# Patient Record
Sex: Male | Born: 1955
Health system: Southern US, Community
[De-identification: ages and names within clinical notes are randomized; demographics above are authoritative.]

## PROBLEM LIST (undated history)

## (undated) DIAGNOSIS — I509 Heart failure, unspecified: Secondary | ICD-10-CM

## (undated) DIAGNOSIS — I2510 Atherosclerotic heart disease of native coronary artery without angina pectoris: Secondary | ICD-10-CM

## (undated) DIAGNOSIS — F32A Depression, unspecified: Secondary | ICD-10-CM

## (undated) DIAGNOSIS — I219 Acute myocardial infarction, unspecified: Secondary | ICD-10-CM

## (undated) DIAGNOSIS — I10 Essential (primary) hypertension: Secondary | ICD-10-CM

## (undated) DIAGNOSIS — F329 Major depressive disorder, single episode, unspecified: Secondary | ICD-10-CM

## (undated) DIAGNOSIS — F99 Mental disorder, not otherwise specified: Secondary | ICD-10-CM

---

## 1982-04-25 DIAGNOSIS — I219 Acute myocardial infarction, unspecified: Secondary | ICD-10-CM

## 1982-04-25 HISTORY — DX: Acute myocardial infarction, unspecified: I21.9

## 1982-04-25 HISTORY — PX: ANGIOPLASTY: SHX39

## 2000-01-19 ENCOUNTER — Emergency Department (HOSPITAL_COMMUNITY): Admission: EM | Admit: 2000-01-19 | Discharge: 2000-01-19 | Payer: Self-pay

## 2000-08-09 ENCOUNTER — Emergency Department (HOSPITAL_COMMUNITY): Admission: EM | Admit: 2000-08-09 | Discharge: 2000-08-09 | Payer: Self-pay | Admitting: *Deleted

## 2001-03-28 ENCOUNTER — Encounter: Admission: RE | Admit: 2001-03-28 | Discharge: 2001-06-26 | Payer: Self-pay | Admitting: Family Medicine

## 2001-04-27 ENCOUNTER — Encounter: Payer: Self-pay | Admitting: Family Medicine

## 2001-04-27 ENCOUNTER — Encounter: Admission: RE | Admit: 2001-04-27 | Discharge: 2001-04-27 | Payer: Self-pay | Admitting: Family Medicine

## 2002-09-20 ENCOUNTER — Inpatient Hospital Stay (HOSPITAL_COMMUNITY): Admission: EM | Admit: 2002-09-20 | Discharge: 2002-09-24 | Payer: Self-pay | Admitting: Psychiatry

## 2003-01-30 ENCOUNTER — Encounter: Admission: RE | Admit: 2003-01-30 | Discharge: 2003-01-30 | Payer: Self-pay | Admitting: Family Medicine

## 2003-01-30 ENCOUNTER — Encounter: Payer: Self-pay | Admitting: Family Medicine

## 2003-06-09 ENCOUNTER — Encounter: Admission: RE | Admit: 2003-06-09 | Discharge: 2003-06-09 | Payer: Self-pay | Admitting: Neurology

## 2007-08-31 ENCOUNTER — Emergency Department (HOSPITAL_COMMUNITY): Admission: EM | Admit: 2007-08-31 | Discharge: 2007-08-31 | Payer: Self-pay | Admitting: Emergency Medicine

## 2007-11-06 ENCOUNTER — Encounter: Admission: RE | Admit: 2007-11-06 | Discharge: 2007-11-06 | Payer: Self-pay | Admitting: Sports Medicine

## 2007-11-29 ENCOUNTER — Encounter: Admission: RE | Admit: 2007-11-29 | Discharge: 2007-11-29 | Payer: Self-pay | Admitting: Sports Medicine

## 2007-12-20 ENCOUNTER — Encounter: Admission: RE | Admit: 2007-12-20 | Discharge: 2007-12-20 | Payer: Self-pay | Admitting: Sports Medicine

## 2008-01-23 ENCOUNTER — Emergency Department (HOSPITAL_COMMUNITY): Admission: EM | Admit: 2008-01-23 | Discharge: 2008-01-23 | Payer: Self-pay | Admitting: Emergency Medicine

## 2008-04-23 ENCOUNTER — Inpatient Hospital Stay (HOSPITAL_COMMUNITY): Admission: RE | Admit: 2008-04-23 | Discharge: 2008-05-01 | Payer: Self-pay | Admitting: Psychiatry

## 2008-04-23 ENCOUNTER — Ambulatory Visit: Payer: Self-pay | Admitting: Psychiatry

## 2008-04-23 ENCOUNTER — Emergency Department (HOSPITAL_COMMUNITY): Admission: EM | Admit: 2008-04-23 | Discharge: 2008-04-23 | Payer: Self-pay | Admitting: Emergency Medicine

## 2008-10-23 ENCOUNTER — Emergency Department (HOSPITAL_COMMUNITY): Admission: EM | Admit: 2008-10-23 | Discharge: 2008-10-23 | Payer: Self-pay | Admitting: Emergency Medicine

## 2008-11-23 ENCOUNTER — Emergency Department (HOSPITAL_COMMUNITY): Admission: EM | Admit: 2008-11-23 | Discharge: 2008-11-23 | Payer: Self-pay | Admitting: Emergency Medicine

## 2010-05-17 ENCOUNTER — Encounter: Payer: Self-pay | Admitting: Sports Medicine

## 2010-07-31 LAB — BASIC METABOLIC PANEL
BUN: 7 mg/dL (ref 6–23)
CO2: 22 mEq/L (ref 19–32)
Chloride: 103 mEq/L (ref 96–112)
GFR calc Af Amer: >60 mL/min (ref >60)
Potassium: 3.9 mEq/L (ref 3.5–5.1)

## 2010-07-31 LAB — DIFFERENTIAL
Basophils Relative: 1 % (ref 0–1)
Eosinophils Absolute: 0.1 10*3/uL (ref 0.0–0.7)
Eosinophils Relative: 1 % (ref 0–5)
Lymphs Abs: 1.7 10*3/uL (ref 0.7–4.0)
Monocytes Relative: 8 % (ref 3–12)

## 2010-07-31 LAB — CBC
HCT: 43.6 % (ref 39.0–52.0)
MCHC: 34.8 g/dL (ref 30.0–36.0)
MCV: 85.6 fL (ref 78.0–100.0)
RBC: 5.09 MIL/uL (ref 4.22–5.81)
WBC: 5.5 10*3/uL (ref 4.0–10.5)

## 2010-08-01 LAB — COMPREHENSIVE METABOLIC PANEL
Albumin: 3.6 g/dL (ref 3.5–5.2)
BUN: 12 mg/dL (ref 6–23)
Calcium: 8.9 mg/dL (ref 8.4–10.5)
Chloride: 102 mEq/L (ref 96–112)
Creatinine, Ser: 0.9 mg/dL (ref 0.4–1.5)
Total Bilirubin: 0.6 mg/dL (ref 0.3–1.2)
Total Protein: 7.1 g/dL (ref 6.0–8.3)

## 2010-08-01 LAB — CBC
HCT: 45.1 % (ref 39.0–52.0)
MCHC: 34.1 g/dL (ref 30.0–36.0)
MCV: 88.2 fL (ref 78.0–100.0)
Platelets: 212 10*3/uL (ref 150–400)
RDW: 12.2 % (ref 11.5–15.5)

## 2010-08-01 LAB — URINALYSIS, ROUTINE W REFLEX MICROSCOPIC
Bilirubin Urine: NEGATIVE
Leukocytes, UA: NEGATIVE
Nitrite: NEGATIVE
Specific Gravity, Urine: 1.042 — ABNORMAL HIGH (ref 1.005–1.030)
pH: 6 (ref 5.0–8.0)

## 2010-08-01 LAB — DIFFERENTIAL
Basophils Absolute: 0.1 10*3/uL (ref 0.0–0.1)
Lymphocytes Relative: 43 % (ref 12–46)
Lymphs Abs: 2.2 10*3/uL (ref 0.7–4.0)
Monocytes Absolute: 0.3 10*3/uL (ref 0.1–1.0)
Neutro Abs: 2.4 10*3/uL (ref 1.7–7.7)

## 2010-08-01 LAB — GLUCOSE, CAPILLARY: Glucose-Capillary: 270 mg/dL — ABNORMAL HIGH (ref 70–99)
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
## 2011-01-27 LAB — COMPREHENSIVE METABOLIC PANEL
ALT: 27 U/L (ref 0–53)
AST: 37 U/L (ref 0–37)
Albumin: 3.4 g/dL — ABNORMAL LOW (ref 3.5–5.2)
Alkaline Phosphatase: 68 U/L (ref 39–117)
BUN: 9 mg/dL (ref 6–23)
CO2: 21 mEq/L (ref 19–32)
Calcium: 9.4 mg/dL (ref 8.4–10.5)
Chloride: 102 mEq/L (ref 96–112)
Creatinine, Ser: 1 mg/dL (ref 0.4–1.5)
GFR calc Af Amer: >60 mL/min (ref >60)
GFR calc non Af Amer: >60 mL/min (ref >60)
Glucose, Bld: 174 mg/dL — ABNORMAL HIGH (ref 70–99)
Potassium: 3.8 mEq/L (ref 3.5–5.1)
Sodium: 138 mEq/L (ref 135–145)
Total Bilirubin: 0.9 mg/dL (ref 0.3–1.2)
Total Protein: 7.1 g/dL (ref 6.0–8.3)

## 2011-01-27 LAB — DIFFERENTIAL
Basophils Absolute: 0.1 10*3/uL (ref 0.0–0.1)
Basophils Relative: 1 % (ref 0–1)
Eosinophils Absolute: 0.1 10*3/uL (ref 0.0–0.7)
Eosinophils Relative: 2 % (ref 0–5)
Lymphocytes Relative: 36 % (ref 12–46)
Lymphs Abs: 1.8 10*3/uL (ref 0.7–4.0)
Monocytes Absolute: 0.4 10*3/uL (ref 0.1–1.0)
Monocytes Relative: 7 % (ref 3–12)
Neutro Abs: 2.8 10*3/uL (ref 1.7–7.7)
Neutrophils Relative %: 54 % (ref 43–77)

## 2011-01-27 LAB — RAPID URINE DRUG SCREEN, HOSP PERFORMED
Amphetamines: NOT DETECTED
Barbiturates: NOT DETECTED
Benzodiazepines: NOT DETECTED
Cocaine: POSITIVE — AB
Opiates: NOT DETECTED
Tetrahydrocannabinol: NOT DETECTED

## 2011-01-27 LAB — CBC
HCT: 49 % (ref 39.0–52.0)
Hemoglobin: 16.1 g/dL (ref 13.0–17.0)
MCHC: 32.9 g/dL (ref 30.0–36.0)
MCV: 90.4 fL (ref 78.0–100.0)
Platelets: 359 10*3/uL (ref 150–400)
RBC: 5.42 MIL/uL (ref 4.22–5.81)
RDW: 11.9 % (ref 11.5–15.5)
WBC: 5.1 10*3/uL (ref 4.0–10.5)

## 2011-01-27 LAB — ETHANOL: Alcohol, Ethyl (B): 107 mg/dL — ABNORMAL HIGH (ref 0–10)

## 2011-11-29 ENCOUNTER — Emergency Department (HOSPITAL_COMMUNITY)
Admission: EM | Admit: 2011-11-29 | Discharge: 2011-11-29 | Disposition: A | Discharge status: Home / Self Care | Payer: Medicare Other | Attending: Emergency Medicine | Admitting: Emergency Medicine

## 2011-11-29 ENCOUNTER — Encounter (HOSPITAL_COMMUNITY): Payer: Self-pay | Admitting: Emergency Medicine

## 2011-11-29 DIAGNOSIS — F17200 Nicotine dependence, unspecified, uncomplicated: Secondary | ICD-10-CM | POA: Insufficient documentation

## 2011-11-29 DIAGNOSIS — I10 Essential (primary) hypertension: Secondary | ICD-10-CM | POA: Insufficient documentation

## 2011-11-29 DIAGNOSIS — E119 Type 2 diabetes mellitus without complications: Secondary | ICD-10-CM | POA: Insufficient documentation

## 2011-11-29 DIAGNOSIS — R739 Hyperglycemia, unspecified: Secondary | ICD-10-CM

## 2011-11-29 DIAGNOSIS — I252 Old myocardial infarction: Secondary | ICD-10-CM | POA: Insufficient documentation

## 2011-11-29 DIAGNOSIS — I2510 Atherosclerotic heart disease of native coronary artery without angina pectoris: Secondary | ICD-10-CM | POA: Insufficient documentation

## 2011-11-29 DIAGNOSIS — E876 Hypokalemia: Secondary | ICD-10-CM

## 2011-11-29 HISTORY — DX: Acute myocardial infarction, unspecified: I21.9

## 2011-11-29 HISTORY — DX: Atherosclerotic heart disease of native coronary artery without angina pectoris: I25.10

## 2011-11-29 HISTORY — DX: Essential (primary) hypertension: I10

## 2011-11-29 LAB — COMPREHENSIVE METABOLIC PANEL
Albumin: 2.8 g/dL — ABNORMAL LOW (ref 3.5–5.2)
Alkaline Phosphatase: 83 U/L (ref 39–117)
BUN: 3 mg/dL — ABNORMAL LOW (ref 6–23)
CO2: 21 mEq/L (ref 19–32)
Chloride: 97 mEq/L (ref 96–112)
GFR calc Af Amer: >90 mL/min (ref >90)
GFR calc non Af Amer: >90 mL/min (ref >90)
Glucose, Bld: 302 mg/dL — ABNORMAL HIGH (ref 70–99)
Potassium: 2.6 mEq/L — CL (ref 3.5–5.1)
Total Bilirubin: 0.9 mg/dL (ref 0.3–1.2)

## 2011-11-29 LAB — CBC WITH DIFFERENTIAL/PLATELET
Basophils Absolute: 0 10*3/uL (ref 0.0–0.1)
Eosinophils Absolute: 0 10*3/uL (ref 0.0–0.7)
Eosinophils Relative: 0 % (ref 0–5)
Lymphocytes Relative: 15 % (ref 12–46)
MCV: 84.5 fL (ref 78.0–100.0)
Platelets: 285 10*3/uL (ref 150–400)
RDW: 13.1 % (ref 11.5–15.5)
WBC: 12.4 10*3/uL — ABNORMAL HIGH (ref 4.0–10.5)

## 2011-11-29 LAB — URINALYSIS, ROUTINE W REFLEX MICROSCOPIC
Ketones, ur: NEGATIVE mg/dL
Leukocytes, UA: NEGATIVE
Nitrite: NEGATIVE
Protein, ur: NEGATIVE mg/dL
pH: 6.5 (ref 5.0–8.0)

## 2011-11-29 LAB — URINE MICROSCOPIC-ADD ON

## 2011-11-29 LAB — POCT I-STAT 3, VENOUS BLOOD GAS (G3P V): Acid-Base Excess: 1 mmol/L (ref 0.0–2.0)

## 2011-11-29 LAB — GLUCOSE, CAPILLARY: Glucose-Capillary: 180 mg/dL — ABNORMAL HIGH (ref 70–99)

## 2011-11-29 MED ORDER — POTASSIUM CHLORIDE CRYS ER 20 MEQ PO TBCR
80.0000 meq | EXTENDED_RELEASE_TABLET | Freq: Once | ORAL | Status: AC
  Administered 2011-11-29: 80 meq via ORAL
  Filled 2011-11-29: qty 4

## 2011-11-29 MED ORDER — SODIUM CHLORIDE 0.9 % IV BOLUS (SEPSIS)
1000.0000 mL | Freq: Once | INTRAVENOUS | Status: AC
  Administered 2011-11-29: 1000 mL via INTRAVENOUS

## 2011-11-29 NOTE — ED Notes (Signed)
cbg is 283

## 2011-11-29 NOTE — ED Notes (Signed)
Pt c/o CBG being 685 last night at bed time, per pt he injected 10u of insulin before bed. This AM his CBG was 263 upon arrival to ED, pt did not take CBG at home and did not take insulin this AM.

## 2011-11-29 NOTE — ED Notes (Signed)
NAD noted at time of d/c home. Pt verbalized understanding of d/c inst. 

## 2011-11-29 NOTE — ED Provider Notes (Signed)
History     CSN: [id number]  Arrival date & time 11/29/11  0811   First MD Initiated Contact with Patient 11/29/11 0827      Chief Complaint  Patient presents with  . Hyperglycemia    (Consider location/radiation/quality/duration/timing/severity/associated sxs/prior treatment) HPI The patient presents with concerns of hyperglycemia and generalized fatigue.  He notes that approximately one month ago he stopped taking his medication regularly.  He has a history of hypertension, and diabetes.  He notes that over the past days in particular he has had generalized weakness, easy fatigability, mild anorexia.  Yesterday he took his blood sugar, found the results to be 600.  Today, as his symptoms persisted he presents for evaluation. He denies any ongoing chest pain, dyspnea, disorientation, vomiting.  Past Medical History  Diagnosis Date  . Diabetes mellitus   . Hypertension   . Coronary artery disease   . MI (myocardial infarction)     Past Surgical History  Procedure Date  . Angioplasty     History reviewed. No pertinent family history.  History  Substance Use Topics  . Smoking status: Current Everyday Smoker -- 0.5 packs/day    Types: Cigarettes  . Smokeless tobacco: Not on file  . Alcohol Use: Yes      Review of Systems  Constitutional:       Per HPI, otherwise negative  HENT:       Per HPI, otherwise negative  Eyes: Negative.   Respiratory:       Per HPI, otherwise negative  Cardiovascular:       Per HPI, otherwise negative  Gastrointestinal: Negative for vomiting.  Genitourinary: Negative.   Musculoskeletal:       Per HPI, otherwise negative  Skin: Negative.   Neurological: Negative for syncope.    Allergies  Review of patient's allergies indicates no known allergies.  Home Medications   Current Outpatient Rx  Name Route Sig Dispense Refill  . PRESCRIPTION MEDICATION Subcutaneous Inject 10 Units into the skin 2 (two) times daily. insulin      BP  142/83  Pulse 90  Temp 97.8 F (36.6 C) (Oral)  Resp 20  Ht 6\' 2"  (1.88 m)  Wt 185 lb (83.915 kg)  BMI 23.75 kg/m2  SpO2 99%  Physical Exam  Nursing note and vitals reviewed. Constitutional: He is oriented to person, place, and time. He appears well-developed. No distress.  HENT:  Head: Normocephalic and atraumatic.  Eyes: Conjunctivae and EOM are normal.  Cardiovascular: Normal rate and regular rhythm.   Pulmonary/Chest: Effort normal. No stridor. No respiratory distress.  Abdominal: He exhibits no distension.  Musculoskeletal: He exhibits no edema.  Neurological: He is alert and oriented to person, place, and time.  Skin: Skin is warm and dry.  Psychiatric: He has a normal mood and affect.    ED Course  Procedures (including critical care time)  Labs Reviewed  GLUCOSE, CAPILLARY - Abnormal; Notable for the following:    Glucose-Capillary 283 (*)     All other components within normal limits  COMPREHENSIVE METABOLIC PANEL  CBC WITH DIFFERENTIAL  URINALYSIS, ROUTINE W REFLEX MICROSCOPIC   No results found.   No diagnosis found.  Pulse ox 99% room air normal   Date: 11/29/2011  Rate: 74  Rhythm: normal sinus rhythm  QRS Axis: normal  Intervals: QT prolonged  ST/T Wave abnormalities: normal  Conduction Disutrbances:lvh  Narrative Interpretation:   Old EKG Reviewed: unchanged   MDM  This insulin dependant diabetic now p/w multiple complaints.  On my exam he was AOx3, w unremarkable exam.  Labs checked while the patient was resuscitated w IVF, K.  He felt significantly better, and was d/c after a long conversation on the need for med compliance and a repeat potassium draw.  Gerhard Munch, MD 12/02/11 [id number]

## 2012-04-30 ENCOUNTER — Emergency Department (HOSPITAL_COMMUNITY)
Admission: EM | Admit: 2012-04-30 | Discharge: 2012-04-30 | Disposition: A | Discharge status: Home / Self Care | Payer: Medicare Other | Attending: Emergency Medicine | Admitting: Emergency Medicine

## 2012-04-30 ENCOUNTER — Encounter (HOSPITAL_COMMUNITY): Payer: Self-pay | Admitting: *Deleted

## 2012-04-30 DIAGNOSIS — R739 Hyperglycemia, unspecified: Secondary | ICD-10-CM

## 2012-04-30 DIAGNOSIS — E1169 Type 2 diabetes mellitus with other specified complication: Secondary | ICD-10-CM | POA: Insufficient documentation

## 2012-04-30 DIAGNOSIS — Z79899 Other long term (current) drug therapy: Secondary | ICD-10-CM | POA: Insufficient documentation

## 2012-04-30 DIAGNOSIS — Z91199 Patient's noncompliance with other medical treatment and regimen due to unspecified reason: Secondary | ICD-10-CM

## 2012-04-30 DIAGNOSIS — Z9119 Patient's noncompliance with other medical treatment and regimen: Secondary | ICD-10-CM

## 2012-04-30 DIAGNOSIS — E119 Type 2 diabetes mellitus without complications: Secondary | ICD-10-CM

## 2012-04-30 DIAGNOSIS — R631 Polydipsia: Secondary | ICD-10-CM | POA: Insufficient documentation

## 2012-04-30 DIAGNOSIS — F17200 Nicotine dependence, unspecified, uncomplicated: Secondary | ICD-10-CM | POA: Insufficient documentation

## 2012-04-30 DIAGNOSIS — Z9889 Other specified postprocedural states: Secondary | ICD-10-CM | POA: Insufficient documentation

## 2012-04-30 DIAGNOSIS — I10 Essential (primary) hypertension: Secondary | ICD-10-CM | POA: Insufficient documentation

## 2012-04-30 DIAGNOSIS — I252 Old myocardial infarction: Secondary | ICD-10-CM | POA: Insufficient documentation

## 2012-04-30 DIAGNOSIS — I2510 Atherosclerotic heart disease of native coronary artery without angina pectoris: Secondary | ICD-10-CM | POA: Insufficient documentation

## 2012-04-30 LAB — POCT I-STAT 3, VENOUS BLOOD GAS (G3P V)
Bicarbonate: 22.6 mEq/L (ref 20.0–24.0)
O2 Saturation: 16 %
TCO2: 24 mmol/L (ref 0–100)
pCO2, Ven: 47.3 mmHg (ref 45.0–50.0)
pO2, Ven: 15 mmHg — CL (ref 30.0–45.0)

## 2012-04-30 LAB — GLUCOSE, CAPILLARY
Glucose-Capillary: 337 mg/dL — ABNORMAL HIGH (ref 70–99)
Glucose-Capillary: 291 mg/dL — ABNORMAL HIGH (ref 70–99)
Glucose-Capillary: 164 mg/dL — ABNORMAL HIGH (ref 70–99)
Glucose-Capillary: 230 mg/dL — ABNORMAL HIGH (ref 70–99)
Glucose-Capillary: 178 mg/dL — ABNORMAL HIGH (ref 70–99)

## 2012-04-30 LAB — URINALYSIS, ROUTINE W REFLEX MICROSCOPIC
Bilirubin Urine: NEGATIVE
Glucose, UA: >1000 mg/dL — AB
Hgb urine dipstick: NEGATIVE
Specific Gravity, Urine: 1.038 — ABNORMAL HIGH (ref 1.005–1.030)
Urobilinogen, UA: 0.2 mg/dL (ref 0.0–1.0)

## 2012-04-30 LAB — CBC WITH DIFFERENTIAL/PLATELET
Basophils Relative: 1 % (ref 0–1)
Eosinophils Absolute: 0.1 10*3/uL (ref 0.0–0.7)
HCT: 39.6 % (ref 39.0–52.0)
Hemoglobin: 13.9 g/dL (ref 13.0–17.0)
MCH: 31.2 pg (ref 26.0–34.0)
MCHC: 35.1 g/dL (ref 30.0–36.0)
Monocytes Absolute: 0.8 10*3/uL (ref 0.1–1.0)
Monocytes Relative: 14 % — ABNORMAL HIGH (ref 3–12)

## 2012-04-30 LAB — BASIC METABOLIC PANEL
BUN: 11 mg/dL (ref 6–23)
Chloride: 94 mEq/L — ABNORMAL LOW (ref 96–112)
Creatinine, Ser: 0.86 mg/dL (ref 0.50–1.35)
GFR calc Af Amer: >90 mL/min (ref >90)
GFR calc non Af Amer: >90 mL/min (ref >90)
Glucose, Bld: 705 mg/dL (ref 70–99)

## 2012-04-30 MED ORDER — SODIUM CHLORIDE 0.9 % IV SOLN
INTRAVENOUS | Status: DC
  Administered 2012-04-30: 3.8 [IU]/h via INTRAVENOUS
  Filled 2012-04-30: qty 1

## 2012-04-30 MED ORDER — SODIUM CHLORIDE 0.9 % IV BOLUS (SEPSIS)
1000.0000 mL | Freq: Once | INTRAVENOUS | Status: AC
  Administered 2012-04-30: 1000 mL via INTRAVENOUS

## 2012-04-30 MED ORDER — DEXTROSE-NACL 5-0.45 % IV SOLN: INTRAVENOUS | Status: DC

## 2012-04-30 MED ORDER — SODIUM CHLORIDE 0.9 % IV SOLN: INTRAVENOUS | Status: DC

## 2012-04-30 MED ORDER — METFORMIN HCL 500 MG PO TABS: 500.0000 mg | ORAL_TABLET | Freq: Two times a day (BID) | ORAL | Status: DC

## 2012-04-30 NOTE — ED Provider Notes (Signed)
History     CSN: [id number]  Arrival date & time 04/30/12  3664   First MD Initiated Contact with Patient 04/30/12 [phone]      Chief Complaint  Patient presents with  . Hyperglycemia    (Consider location/radiation/quality/duration/timing/severity/associated sxs/prior treatment) HPI Comments: [name] is a 57 y.o. male who presents for evaluation of polydipsia. He stopped taking his oral hypoglycemic agent, and insulin 6 months ago. He had no particular reason for stopping. He reports losing weight. Feeling dizzy, and feeling weak for several days. He denies headache, chest pain, nausea, vomiting, or abdominal pain. He gets more dizzy with standing. There are no alleviating factors.  The history is provided by the patient.    Past Medical History  Diagnosis Date  . Diabetes mellitus   . Hypertension   . Coronary artery disease   . MI (myocardial infarction)     Past Surgical History  Procedure Date  . Angioplasty     History reviewed. No pertinent family history.  History  Substance Use Topics  . Smoking status: Current Every Day Smoker -- 0.5 packs/day    Types: Cigarettes  . Smokeless tobacco: Never Used  . Alcohol Use: Yes      Review of Systems  All other systems reviewed and are negative.    Allergies  Review of patient's allergies indicates no known allergies.  Home Medications   Current Outpatient Rx  Name  Route  Sig  Dispense  Refill  . TUMS PO   Oral   Take 4 tablets by mouth 2 (two) times daily as needed. For indigestion         . IBUPROFEN 200 MG PO TABS   Oral   Take 400-600 mg by mouth every 6 (six) hours as needed.         Marland Kitchen GAS-X PO   Oral   Take 2 tablets by mouth daily as needed. For gas pain         . METFORMIN HCL 500 MG PO TABS   Oral   Take 1 tablet (500 mg total) by mouth 2 (two) times daily with a meal.   60 tablet   0     BP 144/83  Pulse 74  Temp 98.3 F (36.8 C) (Oral)  Resp 19  Ht 6\' 2"  (1.88 m)   Wt 170 lb (77.111 kg)  BMI 21.83 kg/m2  SpO2 100%  Physical Exam  Nursing note and vitals reviewed. Constitutional: He is oriented to person, place, and time. He appears well-developed.       Underweight.  HENT:  Head: Normocephalic and atraumatic.  Right Ear: External ear normal.  Left Ear: External ear normal.  Eyes: Conjunctivae normal and EOM are normal. Pupils are equal, round, and reactive to light.  Neck: Normal range of motion and phonation normal. Neck supple.  Cardiovascular: Normal rate, regular rhythm, normal heart sounds and intact distal pulses.   Pulmonary/Chest: Effort normal and breath sounds normal. He exhibits no bony tenderness.  Abdominal: Soft. Normal appearance. There is no tenderness.  Musculoskeletal: Normal range of motion.  Neurological: He is alert and oriented to person, place, and time. He has normal strength. No cranial nerve deficit or sensory deficit. He exhibits normal muscle tone. Coordination normal.  Skin: Skin is warm, dry and intact.  Psychiatric: He has a normal mood and affect. His behavior is normal. Judgment and thought content normal.    ED Course  Procedures (including critical care time)  Emergency department  treatment: IV fluid 2 L bolus, followed by maintenance. Normal saline.  IV, insulin per glucose stabilizer.  Reevaluation: 11:30; he feels better. His CBG is less than 200. He does not know if he can get his medications filled, on his own. I asked the nurse, to have the case manager see him to see if they can help.  [facility]. He reportedly has refills on them at the [place] as well. They are: Lantus 10 units each bedtime, Wellbutrin 100 mg each bedtime, lisinopril 5 mg daily, Lopressor 12.5 mg twice a day, sertraline 100 mg each bedtime and trazodone 50 mg each bedtime.  Patient has been evaluated by case management; he only has a little bit of money to purchase medications and  is on Medicare. He will be able to get medicines on the $4 list  CRITICAL CARE Performed by: Mancel Bale L   Total critical care time: 45 minutes Critical care time was exclusive of separately billable procedures and treating other patients.  Critical care was necessary to treat or prevent imminent or life-threatening deterioration.  Critical care was time spent personally by me on the following activities: development of treatment plan with patient and/or surrogate as well as nursing, discussions with consultants, evaluation of patient's response to treatment, examination of patient, obtaining history from patient or surrogate, ordering and performing treatments and interventions, ordering and review of laboratory studies, ordering and review of radiographic studies, pulse oximetry and re-evaluation of patient's condition.    Nursing notes, applicable records and vitals reviewed.   Radiologic Images/Reports reviewed.  Labs Reviewed  GLUCOSE, CAPILLARY - Abnormal; Notable for the following:    Glucose-Capillary >600 (*)     All other components within normal limits  CBC WITH DIFFERENTIAL - Abnormal; Notable for the following:    Monocytes Relative 14 (*)     All other components within normal limits  BASIC METABOLIC PANEL - Abnormal; Notable for the following:    Sodium 130 (*)     Chloride 94 (*)     Glucose, Bld 705 (*)     All other components within normal limits  URINALYSIS, ROUTINE W REFLEX MICROSCOPIC - Abnormal; Notable for the following:    Specific Gravity, Urine 1.038 (*)     Glucose, UA >1000 (*)     All other components within normal limits  POCT I-STAT 3, BLOOD GAS (G3P V) - Abnormal; Notable for the following:    pO2, Ven 15.0 (*)     Acid-base deficit 5.0 (*)     All other components within normal limits  GLUCOSE, CAPILLARY - Abnormal; Notable for the following:    Glucose-Capillary 439 (*)     All other components within normal limits  GLUCOSE, CAPILLARY  - Abnormal; Notable for the following:    Glucose-Capillary 337 (*)     All other components within normal limits  GLUCOSE, CAPILLARY - Abnormal; Notable for the following:    Glucose-Capillary 291 (*)     All other components within normal limits  GLUCOSE, CAPILLARY - Abnormal; Notable for the following:    Glucose-Capillary 164 (*)     All other components within normal limits  GLUCOSE, CAPILLARY - Abnormal; Notable for the following:    Glucose-Capillary 289 (*)     All other components within normal limits  GLUCOSE, CAPILLARY - Abnormal; Notable for the following:    Glucose-Capillary 230 (*)     All other components within normal limits  GLUCOSE, CAPILLARY - Abnormal; Notable for the following:    Glucose-Capillary 140 (*)     All other components within normal limits  GLUCOSE, CAPILLARY - Abnormal; Notable for the following:    Glucose-Capillary 178 (*)     All other components within normal limits  KETONES, QUALITATIVE  URINE MICROSCOPIC-ADD ON   Nursing notes, applicable records and vitals reviewed.  Radiologic Images/Reports reviewed.    1. Hyperglycemia   2. Diabetes   3. Noncompliance       MDM  Hyperglycemia, and diabetic, who is noncompliant with minimally elevated anion gap. PH is normal. I doubt DKA He has moderate dehydration. He is improved, with treatment in the emergency department. Doubt metabolic instability, serious bacterial infection or impending vascular collapse; the patient is stable for discharge.     Plan: Home Medications- Metformin; Home Treatments- increase fluid; Recommended follow up- PCP. Followup in 3 days     Flint Melter, MD 04/30/12 1750

## 2012-04-30 NOTE — Progress Notes (Signed)
04/30/12 1252 Called to aatmept to get an appointment for pt pt vis [phone] option 2 but informed for a f/u appointment CM must speak with [name], Dr Thomas Barker RN to schedule and fax d/c instructions including Rx given from [facility] ED to Fax #[phone] ATTN [name]  Still Pending a call from Thomas [place] RN

## 2012-04-30 NOTE — ED Notes (Signed)
Pt reports his sugar is high because he has been drinking a lot of water for the past three days. Pt denies checking his blood sugar.

## 2012-04-30 NOTE — Progress Notes (Signed)
[facility] ED CM spoke with Adela Lank, [place] RN to confirm pt was given Rx on 06/15/11 for glimepiride 10 mg po bid and on his very last recorded [facility] visit on 10/26/11 pt started on Regular insulin and Lantus 10 u q hs and told to increase by 2 units qhs until cbg 120 or below (cbg at that time was > 500)  Pt reports being non compliant with this. [facility] Rn confirmed pt will not be able to obtain free medications at [facility] clinic Pt is not a candidate for [facility] MATCH program but has been encouraged to take prescriptions provided today by EDP to [facility] outpatient pharmacy for discount if possible Pt Informed that he is not a MATCH candidate and discount at [facility] outpatient pharmacy is not a guarantee because he is a confirmed medicare covered patient Pt was given an appointment for Thursday May 03 2012 at 11 am at [facility] clinic by [facility] RN and pt informed he needs to be early for this appointment CM updated [facility] ED RN and Pharmacist, Baron Hamper, at [facility] ED.  CM spoke with [facility] outpatient pharmacist, to get quotes for medicine costs Recommends pt get $4 medications from [place] and [place] versus outpatient pharmacy CM updated EDP, Effie Shy who states pt can be d/c on only Metformin $4 ist medicine from [facility] pharmacy

## 2012-04-30 NOTE — Progress Notes (Signed)
CM faxed clinicals to assist with [facility]/03/12 [facility] appointment to Janae Sauce, [place] RN at [phone] with confirmation report

## 2012-04-30 NOTE — Progress Notes (Signed)
   CARE MANAGEMENT ED NOTE 04/30/2012  Patient:  [name], [name]   Account Number:  [id number]  Date Initiated:  04/30/2012  Documentation initiated by:  Edd Arbour  Subjective/Objective Assessment:   27 M Medicare pt who states he sees a pcp (African MD) in [place] [facility] clinic Has not seen pcp in a year Living with a male friend in [place] Dx hyperglycemia Has not been taking medications in a year     Subjective/Objective Assessment Detail:   No medications on file per [facility] 04/30/12 record- -given doses of regular insulin and ns boluses in [facility] ED    PCP is at [facility] clinic PCP is Dr Hulen Skains  Pt agrees to pay for $4 medicines if available     Action/Plan:   Informed of not being a candidate for [facility] program. Inquired if he is willing to pay $4 for medication   Action/Plan Detail:   1225 CM left a voice message for Dr Dayna Barker RN, Adela Lank to return a call to assist with list of medications for pt and a MD appointment Dr Effie Shy and [facility] ED RN updated on   Anticipated DC Date:  04/30/2012     Status Recommendation to Physician:   Result of Recommendation:    Other ED Services  Consult Working Plan    DC Planning Services  Other  PCP issues

## 2012-05-21 ENCOUNTER — Emergency Department (HOSPITAL_COMMUNITY)
Admission: EM | Admit: 2012-05-21 | Discharge: 2012-05-21 | Disposition: A | Discharge status: Home / Self Care | Payer: Medicare Other | Attending: Emergency Medicine | Admitting: Emergency Medicine

## 2012-05-21 DIAGNOSIS — Z79899 Other long term (current) drug therapy: Secondary | ICD-10-CM | POA: Insufficient documentation

## 2012-05-21 DIAGNOSIS — I10 Essential (primary) hypertension: Secondary | ICD-10-CM | POA: Insufficient documentation

## 2012-05-21 DIAGNOSIS — I252 Old myocardial infarction: Secondary | ICD-10-CM | POA: Insufficient documentation

## 2012-05-21 DIAGNOSIS — B3742 Candidal balanitis: Secondary | ICD-10-CM

## 2012-05-21 DIAGNOSIS — F17200 Nicotine dependence, unspecified, uncomplicated: Secondary | ICD-10-CM | POA: Insufficient documentation

## 2012-05-21 DIAGNOSIS — I2510 Atherosclerotic heart disease of native coronary artery without angina pectoris: Secondary | ICD-10-CM | POA: Insufficient documentation

## 2012-05-21 DIAGNOSIS — E119 Type 2 diabetes mellitus without complications: Secondary | ICD-10-CM | POA: Insufficient documentation

## 2012-05-21 DIAGNOSIS — B3749 Other urogenital candidiasis: Secondary | ICD-10-CM | POA: Insufficient documentation

## 2012-05-21 MED ORDER — CLOTRIMAZOLE 1 % EX CREA: TOPICAL_CREAM | CUTANEOUS | Status: DC

## 2012-05-21 MED ORDER — FLUCONAZOLE 100 MG PO TABS
200.0000 mg | ORAL_TABLET | Freq: Once | ORAL | Status: AC
  Administered 2012-05-21: 200 mg via ORAL
  Filled 2012-05-21: qty 2

## 2012-05-21 NOTE — ED Notes (Signed)
Was seen at [facility] clinic last week and and was told he had yeast on his penis he was to get med for it in mail it did not come

## 2012-05-21 NOTE — ED Provider Notes (Signed)
History     CSN: [id number]  Arrival date & time 05/21/12  1356   First MD Initiated Contact with Patient 05/21/12 1616     Chief complaint: possible yeast infection of penis   (Consider location/radiation/quality/duration/timing/severity/associated sxs/prior treatment) The history is provided by the patient.  pt notes irritation to head of penis, w whitish d/c to head of penis for past couple weeks. No acute or abrupt change today. Hx diabetes. Denies polyuria or polydipsia. No nv. No fever/chills. No urethral discharge or dysuria. Denies prior rx for same. Symptoms constant.     Past Medical History  Diagnosis Date  . Diabetes mellitus   . Hypertension   . Coronary artery disease   . MI (myocardial infarction)     Past Surgical History  Procedure Date  . Angioplasty     No family history on file.  History  Substance Use Topics  . Smoking status: Current Every Day Smoker -- 0.5 packs/day    Types: Cigarettes  . Smokeless tobacco: Never Used  . Alcohol Use: Yes      Review of Systems  Constitutional: Negative for fever and chills.  Gastrointestinal: Negative for nausea and vomiting.  Genitourinary: Negative for dysuria and discharge.    Allergies  Review of patient's allergies indicates no known allergies.  Home Medications   Current Outpatient Rx  Name  Route  Sig  Dispense  Refill  . TUMS PO   Oral   Take 4 tablets by mouth 2 (two) times daily as needed. For indigestion         . IBUPROFEN 200 MG PO TABS   Oral   Take 400-600 mg by mouth every 6 (six) hours as needed.         Marland Kitchen METFORMIN HCL 500 MG PO TABS   Oral   Take 1 tablet (500 mg total) by mouth 2 (two) times daily with a meal.   60 tablet   0   . GAS-X PO   Oral   Take 2 tablets by mouth daily as needed. For gas pain           BP 119/75  Pulse 78  Temp 96.8 F (36 C) (Oral)  Resp 16  SpO2 100%  Physical Exam  Nursing note and vitals reviewed. Constitutional: He is  oriented to person, place, and time. He appears well-developed and well-nourished. No distress.  HENT:  Head: Atraumatic.  Eyes: Conjunctivae normal are normal.  Neck: Neck supple. No tracheal deviation present.  Cardiovascular: Normal rate.   Pulmonary/Chest: Effort normal. No accessory muscle usage. No respiratory distress.  Abdominal: He exhibits no distension. There is no tenderness.  Genitourinary:       Fungal balanitis. No urethral discharge.   Musculoskeletal: Normal range of motion.  Neurological: He is alert and oriented to person, place, and time.  Skin: Skin is warm and dry. No rash noted.       No other rash/lesions  Psychiatric: He has a normal mood and affect.    ED Course  Procedures (including critical care time)    MDM  Fluconazole po.  Discussed hygiene of area, control of dm.    Will give rx clotrimazole at home.           Suzi Roots, MD 05/21/12 [phone]

## 2012-05-26 ENCOUNTER — Encounter (HOSPITAL_COMMUNITY): Payer: Self-pay | Admitting: Emergency Medicine

## 2012-05-26 ENCOUNTER — Emergency Department (HOSPITAL_COMMUNITY)
Admission: EM | Admit: 2012-05-26 | Discharge: 2012-05-26 | Disposition: A | Discharge status: Home / Self Care | Payer: Medicare Other | Attending: Emergency Medicine | Admitting: Emergency Medicine

## 2012-05-26 DIAGNOSIS — F17200 Nicotine dependence, unspecified, uncomplicated: Secondary | ICD-10-CM | POA: Insufficient documentation

## 2012-05-26 DIAGNOSIS — Z79899 Other long term (current) drug therapy: Secondary | ICD-10-CM | POA: Insufficient documentation

## 2012-05-26 DIAGNOSIS — R197 Diarrhea, unspecified: Secondary | ICD-10-CM | POA: Insufficient documentation

## 2012-05-26 DIAGNOSIS — I252 Old myocardial infarction: Secondary | ICD-10-CM | POA: Insufficient documentation

## 2012-05-26 DIAGNOSIS — I10 Essential (primary) hypertension: Secondary | ICD-10-CM | POA: Insufficient documentation

## 2012-05-26 DIAGNOSIS — E119 Type 2 diabetes mellitus without complications: Secondary | ICD-10-CM | POA: Insufficient documentation

## 2012-05-26 DIAGNOSIS — Z794 Long term (current) use of insulin: Secondary | ICD-10-CM | POA: Insufficient documentation

## 2012-05-26 DIAGNOSIS — I2510 Atherosclerotic heart disease of native coronary artery without angina pectoris: Secondary | ICD-10-CM | POA: Insufficient documentation

## 2012-05-26 LAB — URINALYSIS, ROUTINE W REFLEX MICROSCOPIC
Leukocytes, UA: NEGATIVE
Nitrite: NEGATIVE
Specific Gravity, Urine: 1.018 (ref 1.005–1.030)
pH: 5.5 (ref 5.0–8.0)

## 2012-05-26 LAB — GLUCOSE, CAPILLARY: Glucose-Capillary: 134 mg/dL — ABNORMAL HIGH (ref 70–99)

## 2012-05-26 MED ORDER — DIPHENOXYLATE-ATROPINE 2.5-0.025 MG PO TABS
2.0000 | ORAL_TABLET | ORAL | Status: AC
  Administered 2012-05-26: 2 via ORAL
  Filled 2012-05-26: qty 2

## 2012-05-26 NOTE — ED Notes (Signed)
PA at bedside.

## 2012-05-26 NOTE — ED Notes (Signed)
Pt states that diarrhea began 2 weeks ago with some intermmittant abdominal pain. Pt has felt weak and is eating; diarrhea happens after about 5 minutes after eating. Pt eats 3 meals a day.  No nausea or vomiting.  Pt voiding 3 times a day. Penile discharge relieved by recent antibiotics.  Glucose 134.  Pt NPO for now.

## 2012-05-26 NOTE — ED Provider Notes (Signed)
History     CSN: [id number]  Arrival date & time 05/26/12  6213   First MD Initiated Contact with Patient 05/26/12 1006      Chief Complaint  Patient presents with  . Diarrhea    (Consider location/radiation/quality/duration/timing/severity/associated sxs/prior treatment) HPI Comments: Patient presents c/o non-bloody, non-bilious diarrhea x 2 weeks. States his bowel movements are looser than usual and slightly watery in nature; states he has a bowel movement every time he eats. Patient denies polyuria, polydypsia, N/V, abdominal pain, dysuria, lightheadedness, dizziness, fever, SOB, and chest pain. Denies unusual foul odor. Patient was started on fluconazole on 05/22/11 for yeast infection; all symptoms for this have resolved. Was also seen on 05/01/2011 and was prescribed 500mg  metformin PO BID increasing his total metformin dosage to 1500mg  PO BID. States last colonoscopy was 3 yrs ago and was normal  Patient is a 57 y.o. male presenting with diarrhea. The history is provided by the patient. No language interpreter was used.  Diarrhea The primary symptoms include diarrhea. Primary symptoms do not include fever, abdominal pain, nausea, vomiting, dysuria or rash. The illness began more than 7 days ago. The onset was gradual. The problem has not changed since onset. The illness does not include chills or constipation.    Past Medical History  Diagnosis Date  . Diabetes mellitus   . Hypertension   . Coronary artery disease   . MI (myocardial infarction)     Past Surgical History  Procedure Date  . Angioplasty     No family history on file.  History  Substance Use Topics  . Smoking status: Current Every Day Smoker -- 0.5 packs/day    Types: Cigarettes  . Smokeless tobacco: Never Used  . Alcohol Use: Yes      Review of Systems  Constitutional: Negative for fever and chills.  HENT:       Denies dry mouth  Eyes: Negative for visual disturbance.  Respiratory: Negative for  shortness of breath.   Cardiovascular: Negative for chest pain.  Gastrointestinal: Positive for diarrhea. Negative for nausea, vomiting, abdominal pain, constipation, blood in stool and rectal pain.  Genitourinary: Negative for dysuria, frequency, hematuria, decreased urine volume, discharge, difficulty urinating and penile pain.  Skin: Negative for rash.  Neurological: Negative for dizziness and light-headedness.    Allergies  Review of patient's allergies indicates no known allergies.  Home Medications   Current Outpatient Rx  Name  Route  Sig  Dispense  Refill  . BUPROPION HCL 100 MG PO TABS   Oral   Take 100 mg by mouth at bedtime.         . IBUPROFEN 200 MG PO TABS   Oral   Take 400-600 mg by mouth every 6 (six) hours as needed.         . INSULIN GLULISINE IJ   Injection   Inject 28 Units as directed daily.          Marland Kitchen LISINOPRIL 10 MG PO TABS   Oral   Take 5 mg by mouth daily.         Marland Kitchen METFORMIN HCL 1000 MG PO TABS   Oral   Take 1,000 mg by mouth 2 (two) times daily with a meal. Pt takes 1,000mg  and 500mg  tab to equal 1,500 mg twice a day         . METFORMIN HCL 500 MG PO TABS   Oral   Take 500 mg by mouth 2 (two) times daily with a meal. Pt  takes 1,000mg  and 500mg  tab to equal 1,500 mg twice a day         . METOPROLOL TARTRATE 25 MG PO TABS   Oral   Take 25 mg by mouth 2 (two) times daily.         . SERTRALINE HCL 100 MG PO TABS   Oral   Take 100 mg by mouth at bedtime.         . TRAZODONE HCL 50 MG PO TABS   Oral   Take 50 mg by mouth at bedtime.           BP 153/97  Pulse 63  Temp 98.6 F (37 C) (Oral)  Resp 16  SpO2 97%  Physical Exam  Constitutional: He appears well-developed and well-nourished. No distress.  HENT:  Head: Normocephalic and atraumatic.  Mouth/Throat: Oropharynx is clear and moist.       Mucous membranes moist  Eyes: Pupils are equal, round, and reactive to light. No scleral icterus.  Neck: Normal range of  motion.  Cardiovascular: Normal rate and regular rhythm.        Palpable distal pulses  Pulmonary/Chest: Effort normal and breath sounds normal. No respiratory distress. He has no wheezes.  Abdominal: Soft. Bowel sounds are normal. He exhibits no distension and no mass. There is no tenderness. There is no rebound and no guarding.  Musculoskeletal: Normal range of motion. He exhibits no edema.  Neurological: He is alert.  Skin: Skin is warm. He is not diaphoretic.  Psychiatric: He has a normal mood and affect. His behavior is normal.    ED Course  Procedures (including critical care time)  Labs Reviewed  GLUCOSE, CAPILLARY - Abnormal; Notable for the following:    Glucose-Capillary 134 (*)     All other components within normal limits  URINALYSIS, ROUTINE W REFLEX MICROSCOPIC - Abnormal; Notable for the following:    APPearance CLOUDY (*)     Glucose, UA 250 (*)     Bilirubin Urine SMALL (*)     All other components within normal limits   No results found.   1. Diarrhea   2. Diabetes       MDM  Patient is 56y/o male with PMH of diabetes, MI, CAD, and HTN who presents for diarrhea x 2 wks. States he has episodes of diarrhea with each meal; non-bloody, non-bilious, and looser than usual. Denies abdominal pain, N/V, decreased frequency of urination, polydipsia, fever, CP, and SOB. CBG on arrival of 136. Patient has good bowel sounds on exam and is nontender; exam is benign and unable to elicit any positive physical exam findings.   Patient's mucous membranes moist and radial pulses palpable bilaterally; pt states still urinating 2-3 times per day. Based on history and PE findings, not concerned for dehydration in this patient so will not order fluids at this time. Will give patient lomotil to see if improves diarrhea symptoms. Will also obtain UA. Have considered C.diff on differential, but symptoms and presentation do not seem consistent with this diagnosis.  Patient has eaten a  sandwich after getting lomotil. Says he did not have an episode after eating; did have small bowel movement which was firmer than it has been. Patient states he is feeling good. Have told patient that diarrhea/loose stools may be secondary to increase in metformin dosage at the beginning of January and that it does not appear to be infectious in etiology. Have informed him about Imodium that he can get OTC for diarrhea to  improve symptoms and to only take as needed so as not to get constipated. Patient has also been instructed to follow up with PCP regarding his visit today as well as his diabetes.        Antony Madura, PA-C 05/26/12 1314  [address] PA-C 05/29/12 1152

## 2012-05-26 NOTE — ED Notes (Signed)
Tolerating PO's-no complaints of diarrhea at this time

## 2012-05-26 NOTE — ED Notes (Signed)
Pt from home c/o diarrhea x2 weeks. Pt reports that he was given an abx at [facility] x2 weeks ago for a yeast infection. Pt denies N/V, fever, cough, blood in stool, SOB, CP. Pt states he is able to eat/drink normally. Pt in NAD and A&O

## 2012-05-26 NOTE — ED Notes (Addendum)
Urinal at bedside.Pt aware of the need for a urine sample.

## 2012-05-30 NOTE — ED Provider Notes (Signed)
Medical screening examination/treatment/procedure(s) were performed by non-physician practitioner and as supervising physician I was immediately available for consultation/collaboration.   Gavin Pound. Alontae Chaloux, MD 05/30/12 1436

## 2012-08-11 ENCOUNTER — Emergency Department (HOSPITAL_COMMUNITY): Payer: Medicare Other

## 2012-08-11 ENCOUNTER — Encounter (HOSPITAL_COMMUNITY): Payer: Self-pay | Admitting: *Deleted

## 2012-08-11 ENCOUNTER — Other Ambulatory Visit: Payer: Self-pay

## 2012-08-11 ENCOUNTER — Inpatient Hospital Stay (HOSPITAL_COMMUNITY)
Admission: EM | Admit: 2012-08-11 | Discharge: 2012-08-14 | DRG: 287 | Disposition: A | Discharge status: Home / Self Care | Payer: Medicare Other | Attending: Internal Medicine | Admitting: Internal Medicine

## 2012-08-11 DIAGNOSIS — J441 Chronic obstructive pulmonary disease with (acute) exacerbation: Secondary | ICD-10-CM | POA: Diagnosis present

## 2012-08-11 DIAGNOSIS — I4949 Other premature depolarization: Secondary | ICD-10-CM | POA: Diagnosis not present

## 2012-08-11 DIAGNOSIS — Z9861 Coronary angioplasty status: Secondary | ICD-10-CM

## 2012-08-11 DIAGNOSIS — I472 Ventricular tachycardia, unspecified: Secondary | ICD-10-CM | POA: Diagnosis not present

## 2012-08-11 DIAGNOSIS — I5041 Acute combined systolic (congestive) and diastolic (congestive) heart failure: Principal | ICD-10-CM

## 2012-08-11 DIAGNOSIS — I509 Heart failure, unspecified: Secondary | ICD-10-CM

## 2012-08-11 DIAGNOSIS — I2510 Atherosclerotic heart disease of native coronary artery without angina pectoris: Secondary | ICD-10-CM

## 2012-08-11 DIAGNOSIS — F1410 Cocaine abuse, uncomplicated: Secondary | ICD-10-CM | POA: Diagnosis present

## 2012-08-11 DIAGNOSIS — F329 Major depressive disorder, single episode, unspecified: Secondary | ICD-10-CM | POA: Diagnosis present

## 2012-08-11 DIAGNOSIS — F3289 Other specified depressive episodes: Secondary | ICD-10-CM | POA: Diagnosis present

## 2012-08-11 DIAGNOSIS — E876 Hypokalemia: Secondary | ICD-10-CM

## 2012-08-11 DIAGNOSIS — IMO0001 Reserved for inherently not codable concepts without codable children: Secondary | ICD-10-CM | POA: Diagnosis present

## 2012-08-11 DIAGNOSIS — R0902 Hypoxemia: Secondary | ICD-10-CM

## 2012-08-11 DIAGNOSIS — I4729 Other ventricular tachycardia: Secondary | ICD-10-CM | POA: Diagnosis not present

## 2012-08-11 DIAGNOSIS — R0602 Shortness of breath: Secondary | ICD-10-CM | POA: Diagnosis present

## 2012-08-11 DIAGNOSIS — I428 Other cardiomyopathies: Secondary | ICD-10-CM

## 2012-08-11 DIAGNOSIS — I10 Essential (primary) hypertension: Secondary | ICD-10-CM | POA: Diagnosis present

## 2012-08-11 DIAGNOSIS — R0601 Orthopnea: Secondary | ICD-10-CM | POA: Diagnosis present

## 2012-08-11 DIAGNOSIS — I252 Old myocardial infarction: Secondary | ICD-10-CM

## 2012-08-11 DIAGNOSIS — I059 Rheumatic mitral valve disease, unspecified: Secondary | ICD-10-CM

## 2012-08-11 DIAGNOSIS — Z794 Long term (current) use of insulin: Secondary | ICD-10-CM

## 2012-08-11 DIAGNOSIS — F1911 Other psychoactive substance abuse, in remission: Secondary | ICD-10-CM

## 2012-08-11 DIAGNOSIS — Z79899 Other long term (current) drug therapy: Secondary | ICD-10-CM

## 2012-08-11 DIAGNOSIS — F17200 Nicotine dependence, unspecified, uncomplicated: Secondary | ICD-10-CM | POA: Diagnosis present

## 2012-08-11 DIAGNOSIS — J069 Acute upper respiratory infection, unspecified: Secondary | ICD-10-CM | POA: Diagnosis present

## 2012-08-11 DIAGNOSIS — E860 Dehydration: Secondary | ICD-10-CM | POA: Diagnosis present

## 2012-08-11 LAB — CBC WITH DIFFERENTIAL/PLATELET
Basophils Relative: 0 % (ref 0–1)
Hemoglobin: 16.1 g/dL (ref 13.0–17.0)
Lymphs Abs: 2.5 10*3/uL (ref 0.7–4.0)
MCHC: 35.9 g/dL (ref 30.0–36.0)
Monocytes Relative: 10 % (ref 3–12)
Neutro Abs: 3.6 10*3/uL (ref 1.7–7.7)
Neutrophils Relative %: 52 % (ref 43–77)
RBC: 5.49 MIL/uL (ref 4.22–5.81)

## 2012-08-11 LAB — ETHANOL: Alcohol, Ethyl (B): <11 mg/dL (ref 0–11)

## 2012-08-11 LAB — POCT I-STAT TROPONIN I: Troponin i, poc: 0.02 ng/mL (ref 0.00–0.08)

## 2012-08-11 LAB — BASIC METABOLIC PANEL
BUN: 10 mg/dL (ref 6–23)
Chloride: 106 mEq/L (ref 96–112)
GFR calc Af Amer: >90 mL/min (ref >90)
Potassium: 3.2 mEq/L — ABNORMAL LOW (ref 3.5–5.1)
Sodium: 141 mEq/L (ref 135–145)

## 2012-08-11 LAB — TROPONIN I
Troponin I: <0.3 ng/mL (ref <0.30)
Troponin I: <0.3 ng/mL (ref <0.30)

## 2012-08-11 LAB — GLUCOSE, CAPILLARY: Glucose-Capillary: 253 mg/dL — ABNORMAL HIGH (ref 70–99)

## 2012-08-11 LAB — PRO B NATRIURETIC PEPTIDE: Pro B Natriuretic peptide (BNP): 6134 pg/mL — ABNORMAL HIGH (ref 0–125)

## 2012-08-11 LAB — RAPID URINE DRUG SCREEN, HOSP PERFORMED: Opiates: NOT DETECTED

## 2012-08-11 MED ORDER — SODIUM CHLORIDE 0.9 % IJ SOLN
3.0000 mL | Freq: Two times a day (BID) | INTRAMUSCULAR | Status: DC
  Administered 2012-08-11 – 2012-08-14 (×7): 3 mL via INTRAVENOUS

## 2012-08-11 MED ORDER — ENOXAPARIN SODIUM 40 MG/0.4ML ~~LOC~~ SOLN
40.0000 mg | SUBCUTANEOUS | Status: DC
  Administered 2012-08-11 – 2012-08-12 (×2): 40 mg via SUBCUTANEOUS
  Filled 2012-08-11 (×3): qty 0.4

## 2012-08-11 MED ORDER — IPRATROPIUM BROMIDE 0.02 % IN SOLN
0.5000 mg | Freq: Four times a day (QID) | RESPIRATORY_TRACT | Status: DC
  Administered 2012-08-11 – 2012-08-12 (×5): 0.5 mg via RESPIRATORY_TRACT
  Filled 2012-08-11 (×6): qty 2.5

## 2012-08-11 MED ORDER — INSULIN ASPART 100 UNIT/ML ~~LOC~~ SOLN
0.0000 [IU] | Freq: Three times a day (TID) | SUBCUTANEOUS | Status: DC
  Administered 2012-08-11: 7 [IU] via SUBCUTANEOUS
  Administered 2012-08-12: 2 [IU] via SUBCUTANEOUS
  Administered 2012-08-12: 5 [IU] via SUBCUTANEOUS

## 2012-08-11 MED ORDER — ALBUTEROL SULFATE (5 MG/ML) 0.5% IN NEBU
2.5000 mg | INHALATION_SOLUTION | Freq: Four times a day (QID) | RESPIRATORY_TRACT | Status: DC
  Administered 2012-08-11 – 2012-08-12 (×5): 2.5 mg via RESPIRATORY_TRACT
  Filled 2012-08-11 (×5): qty 0.5

## 2012-08-11 MED ORDER — METOPROLOL TARTRATE 25 MG PO TABS
25.0000 mg | ORAL_TABLET | Freq: Two times a day (BID) | ORAL | Status: DC
  Filled 2012-08-11 (×2): qty 1

## 2012-08-11 MED ORDER — LISINOPRIL 5 MG PO TABS
5.0000 mg | ORAL_TABLET | Freq: Every day | ORAL | Status: DC
  Administered 2012-08-12 – 2012-08-13 (×2): 5 mg via ORAL
  Filled 2012-08-11 (×3): qty 1

## 2012-08-11 MED ORDER — POTASSIUM CHLORIDE CRYS ER 20 MEQ PO TBCR
40.0000 meq | EXTENDED_RELEASE_TABLET | Freq: Once | ORAL | Status: AC
  Administered 2012-08-11: 40 meq via ORAL
  Filled 2012-08-11: qty 2

## 2012-08-11 MED ORDER — INSULIN GLARGINE 100 UNIT/ML ~~LOC~~ SOLN
30.0000 [IU] | Freq: Every day | SUBCUTANEOUS | Status: DC
  Administered 2012-08-11 – 2012-08-13 (×3): 30 [IU] via SUBCUTANEOUS
  Filled 2012-08-11 (×4): qty 0.3

## 2012-08-11 MED ORDER — DOXYCYCLINE HYCLATE 100 MG PO TABS
100.0000 mg | ORAL_TABLET | Freq: Two times a day (BID) | ORAL | Status: DC
  Administered 2012-08-11 – 2012-08-14 (×7): 100 mg via ORAL
  Filled 2012-08-11 (×8): qty 1

## 2012-08-11 MED ORDER — ACETAMINOPHEN 650 MG RE SUPP
650.0000 mg | Freq: Four times a day (QID) | RECTAL | Status: DC | PRN
  Filled 2012-08-11: qty 1

## 2012-08-11 MED ORDER — AMLODIPINE BESYLATE 5 MG PO TABS
5.0000 mg | ORAL_TABLET | Freq: Every day | ORAL | Status: DC
  Administered 2012-08-11 – 2012-08-13 (×3): 5 mg via ORAL
  Filled 2012-08-11 (×4): qty 1

## 2012-08-11 MED ORDER — BUPROPION HCL 100 MG PO TABS
100.0000 mg | ORAL_TABLET | Freq: Every day | ORAL | Status: DC
  Administered 2012-08-11 – 2012-08-13 (×3): 100 mg via ORAL
  Filled 2012-08-11 (×4): qty 1

## 2012-08-11 MED ORDER — ASPIRIN EC 81 MG PO TBEC
81.0000 mg | DELAYED_RELEASE_TABLET | Freq: Every day | ORAL | Status: DC
  Administered 2012-08-11 – 2012-08-14 (×4): 81 mg via ORAL
  Filled 2012-08-11 (×4): qty 1

## 2012-08-11 MED ORDER — PREDNISONE 20 MG PO TABS
40.0000 mg | ORAL_TABLET | Freq: Every day | ORAL | Status: DC
  Administered 2012-08-12 – 2012-08-14 (×3): 40 mg via ORAL
  Filled 2012-08-11 (×5): qty 2

## 2012-08-11 MED ORDER — ACETAMINOPHEN 325 MG PO TABS: 650.0000 mg | ORAL_TABLET | Freq: Four times a day (QID) | ORAL | Status: DC | PRN

## 2012-08-11 MED ORDER — IPRATROPIUM BROMIDE 0.02 % IN SOLN
0.5000 mg | Freq: Once | RESPIRATORY_TRACT | Status: AC
  Administered 2012-08-11: 0.5 mg via RESPIRATORY_TRACT
  Filled 2012-08-11: qty 2.5

## 2012-08-11 MED ORDER — ALBUTEROL SULFATE (5 MG/ML) 0.5% IN NEBU
5.0000 mg | INHALATION_SOLUTION | Freq: Once | RESPIRATORY_TRACT | Status: AC
  Administered 2012-08-11: 5 mg via RESPIRATORY_TRACT
  Filled 2012-08-11: qty 1

## 2012-08-11 MED ORDER — TRAZODONE HCL 50 MG PO TABS
50.0000 mg | ORAL_TABLET | Freq: Every day | ORAL | Status: DC
  Administered 2012-08-11 – 2012-08-13 (×3): 50 mg via ORAL
  Filled 2012-08-11 (×4): qty 1

## 2012-08-11 MED ORDER — METHYLPREDNISOLONE SODIUM SUCC 125 MG IJ SOLR
125.0000 mg | Freq: Once | INTRAMUSCULAR | Status: AC
  Administered 2012-08-11: 125 mg via INTRAVENOUS
  Filled 2012-08-11: qty 2

## 2012-08-11 MED ORDER — POTASSIUM CHLORIDE 10 MEQ/100ML IV SOLN
10.0000 meq | INTRAVENOUS | Status: DC
  Filled 2012-08-11: qty 100

## 2012-08-11 MED ORDER — FUROSEMIDE 10 MG/ML IJ SOLN
40.0000 mg | Freq: Once | INTRAMUSCULAR | Status: AC
  Administered 2012-08-11: 40 mg via INTRAVENOUS
  Filled 2012-08-11: qty 4

## 2012-08-11 MED ORDER — MORPHINE SULFATE 2 MG/ML IJ SOLN
1.0000 mg | INTRAMUSCULAR | Status: DC | PRN
  Administered 2012-08-11 – 2012-08-14 (×7): 2 mg via INTRAVENOUS
  Filled 2012-08-11 (×8): qty 1

## 2012-08-11 MED ORDER — FENTANYL CITRATE 0.05 MG/ML IJ SOLN
50.0000 ug | Freq: Once | INTRAMUSCULAR | Status: AC
  Administered 2012-08-11: 50 ug via INTRAVENOUS
  Filled 2012-08-11: qty 2

## 2012-08-11 MED ORDER — HYDRALAZINE HCL 20 MG/ML IJ SOLN: 5.0000 mg | Freq: Four times a day (QID) | INTRAMUSCULAR | Status: DC | PRN

## 2012-08-11 MED ORDER — FUROSEMIDE 10 MG/ML IJ SOLN
40.0000 mg | Freq: Two times a day (BID) | INTRAMUSCULAR | Status: DC
  Administered 2012-08-11 – 2012-08-13 (×4): 40 mg via INTRAVENOUS
  Filled 2012-08-11 (×6): qty 4

## 2012-08-11 MED ORDER — SERTRALINE HCL 100 MG PO TABS
100.0000 mg | ORAL_TABLET | Freq: Every day | ORAL | Status: DC
  Administered 2012-08-11 – 2012-08-13 (×3): 100 mg via ORAL
  Filled 2012-08-11 (×4): qty 1

## 2012-08-11 NOTE — Progress Notes (Signed)
  Echocardiogram 2D Echocardiogram has been performed.  [name] FRANCES 08/11/2012, 5:28 PM

## 2012-08-11 NOTE — Consult Note (Addendum)
CARDIOLOGY CONSULT NOTE  Patient ID: [name] MRN: [id number] DOB/AGE: Jun 07, 1955 57 y.o.  Admit date: 08/11/2012 [facility] Primary Cardiologist: None Reason for Consultation: ? CHF  HPI: 57 yo with history of DM and HTN presented to [facility] with dyspnea.  At baseline, he is short of breath after walking about 1/2 mile.  Yesterday, he noted that he was out of breath after walking about 30 yards.  Last night, he had significant orthopnea and was not able to get to sleep.  This concerned him so he came to the ER early this morning.  He says that when he gets short of breath he also feels a "tightening" in his chest.  He also actually notes a 40 lb weight loss over the last year.  He is an active smoker and appears to also actively be using cocaine (though he denies).  He was a prior heavy drinker but has been off ETOH x 4 months.  Oxygen saturation was 87% at presentation to the ER.  CXR suggested pulmonary edema and BNP is elevated.  Cardiac enzymes are negative so far.  He is being treated for COPD exacerbation currently with prednisone/nebs/doxycycline.  Cardiology asked to evaluate for CHF.  Of note, patient reports that he had a left heart cath for syncope when he was 27.  He says this was done here in [place] and showed a "blockage" that was "cleaned out."    Review of systems complete and found to be negative unless listed above in HPI  Past Medical History: 1. Type II diabetes 2. HTN 3. Active smoker, possible COPD 4. ? CAD: Patient had syncopal episode at age 81 and then had cardiac cath.  He says that he had a "blockage cleaned out."   5. Cocaine abuse  History reviewed. No pertinent family history.  History   Social History  . Marital Status: Widowed    Spouse Name: N/A    Number of Children: N/A  . Years of Education: N/A   Occupational History  . Not on file.   Social History Main Topics  . Smoking status: Current Every Day Smoker -- 0.50  packs/day    Types: Cigarettes  . Smokeless tobacco: Never Used  . Alcohol Use: Not currently but heavy prior  . Drug Use: Cocaine currently  . Sexually Active: Not on file   Other Topics Concern  . Not on file   Social History Narrative  . No narrative on file     Prescriptions prior to admission  Medication Sig Dispense Refill  . buPROPion (WELLBUTRIN) 100 MG tablet Take 100 mg by mouth at bedtime.      Marland Kitchen ibuprofen (ADVIL,MOTRIN) 200 MG tablet Take 400-600 mg by mouth every 6 (six) hours as needed for pain.       Marland Kitchen insulin glargine (LANTUS) 100 UNIT/ML injection Inject 30 Units into the skin at bedtime.      Marland Kitchen lisinopril (PRINIVIL,ZESTRIL) 10 MG tablet Take 5 mg by mouth daily.      . metFORMIN (GLUCOPHAGE) 1000 MG tablet Take 500 mg by mouth 2 (two) times daily with a meal.       . metoprolol tartrate (LOPRESSOR) 25 MG tablet Take 25 mg by mouth 2 (two) times daily.      . sertraline (ZOLOFT) 100 MG tablet Take 100 mg by mouth at bedtime.      . traZODone (DESYREL) 50 MG tablet Take 50 mg by mouth at bedtime.  Current Medications . albuterol  2.5 mg Nebulization Q6H  . aspirin EC  81 mg Oral Daily  . buPROPion  100 mg Oral QHS  . doxycycline  100 mg Oral Q12H  . enoxaparin (LOVENOX) injection  40 mg Subcutaneous Q24H  . insulin aspart  0-9 Units Subcutaneous TID WC  . insulin glargine  30 Units Subcutaneous QHS  . ipratropium  0.5 mg Nebulization Q6H  . [START ON 08/12/2012] lisinopril  5 mg Oral Daily  . [START ON 08/12/2012] predniSONE  40 mg Oral Q breakfast  . sertraline  100 mg Oral QHS  . sodium chloride  3 mL Intravenous Q12H  . traZODone  50 mg Oral QHS    Physical exam Blood pressure 146/83, pulse 80, temperature 97.8 F (36.6 C), temperature source Oral, resp. rate 17, height 6' 2.5" (1.892 m), weight 172 lb 14.4 oz (78.427 kg), SpO2 95.00%. General: NAD Neck: JVP 8 cm but +HJR, no thyromegaly or thyroid nodule.  Lungs: Clear to auscultation bilaterally  with normal respiratory effort. CV: Nondisplaced PMI.  Heart regular S1/S2, no S3/S4, no murmur.  No peripheral edema.  No carotid bruit.  Normal pedal pulses.  Abdomen: Soft, nontender, no hepatosplenomegaly, no distention.  Skin: Intact without lesions or rashes.  Neurologic: Alert and oriented x 3.  Psych: Normal affect. Extremities: No clubbing or cyanosis.  HEENT: Normal.   Labs:   Lab Results  Component Value Date   WBC 6.9 08/11/2012   HGB 16.1 08/11/2012   HCT 44.9 08/11/2012   MCV 81.8 08/11/2012   PLT 247 08/11/2012    Recent Labs Lab 08/11/12 0725  NA 141  K 3.2*  CL 106  CO2 24  BUN 10  CREATININE 0.83  CALCIUM 8.7  GLUCOSE 119*   Lab Results  Component Value Date   TROPONINI <0.30 08/11/2012  BNP 6134 UDS positive for cocaine    Radiology:  - CXR: Bilateral airspace disease, favor pulmonary edema over PNA  EKG: NSR, probable LVH  ASSESSMENT AND PLAN:  1. Dyspnea: I suspect that this is primarily cardiac-related, probably acute CHF (? Systolic).  CXR is concerning for CHF and BNP is quite elevated.  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  His orthopnea is also concerning.  - Needs echocardiogram => should do today.  - Would give Lasix 40 mg IV bid to see if this improves symptoms.  Replace KCl.  - Continue lisinopril, hold off on beta blocker with cocaine use.  - If EF is low on echo, I would proceed with right and left heart cath, especially given question of prior CAD.  2. CAD: Had cath at age 5 with questionable coronary disease at that time.  He needs ASA 81 mg daily.  Will check lipids in am.  As above, if EF is low on echo would plan RHC/LHC.  3. COPD: Patient likely has some degree of COPD but I am not sure that this is the main cause of his dyspnea.  He is being treated for COPD exacerbation by primary service.  No wheezing currently.  4. Substance abuse: Needs to stop cocaine and cigarettes.  He already apparently has stopped drinking.    Marca Ancona 08/11/2012 3:01 PM

## 2012-08-11 NOTE — ED Notes (Signed)
Pt states that he is having a hard time breathing and that his lungs are "all clogged up".

## 2012-08-11 NOTE — ED Notes (Signed)
Patient transported to X-ray 

## 2012-08-11 NOTE — H&P (Signed)
[facility] Note Date: 08/11/2012  Patient name: [name] Medical record number: [id number] Date of birth: [birth year] Age: 57 y.o. Gender: male PCP: [facility] Far[facility]  Medical Service: Internal Medicine Teaching Service   Attending physician:  Dr. Lars Mage     Chief Complaint: Productive cough and shortness of breath   History of Present Illness: [name] is a 57 yo gentleman with a PMHx significant for DM, HTN, CAD, MI (at age 15) with a  2 day history of productive cough w/ yellow sputum and progressive shortness of breath. He reports that his cough has been worsening and was easily winded when walking ~30 feet to his car, a decline from baseline of 1/2 mile without SOB. Symptoms are worsened by lying on either side or on stomach and improved with sitting up and with rest. He reports some chills yesterday without noted fever. He reported some chest discomfort associated with cough but denies current discomfort/pain. Patient and wife agree that he has had a reduced appetite for the last couple of months with an approximately 35 lb weight loss over the last year without attempting to lose weight. He had 2 episodes of non-bloody diarrhea yesterday. He has had his flu and pneumonia vaccines this year. Denies sick contacts. He states that he has continued to take his DM and HTN medications regularly and his sugars have been good. He denies ever having the diagnosis of heart failure or COPD.    Meds: Current Outpatient Rx  Name  Route  Sig  Dispense  Refill  . buPROPion (WELLBUTRIN) 100 MG tablet   Oral   Take 100 mg by mouth at bedtime.         Marland Kitchen ibuprofen (ADVIL,MOTRIN) 200 MG tablet   Oral   Take 400-600 mg by mouth every 6 (six) hours as needed for pain.          Marland Kitchen insulin glargine (LANTUS) 100 UNIT/ML injection   Subcutaneous   Inject 30 Units into the skin at bedtime.         Marland Kitchen lisinopril (PRINIVIL,ZESTRIL) 10 MG tablet   Oral   Take  5 mg by mouth daily.         . metFORMIN (GLUCOPHAGE) 1000 MG tablet   Oral   Take 500 mg by mouth 2 (two) times daily with a meal.          . metoprolol tartrate (LOPRESSOR) 25 MG tablet   Oral   Take 25 mg by mouth 2 (two) times daily.         . sertraline (ZOLOFT) 100 MG tablet   Oral   Take 100 mg by mouth at bedtime.         . traZODone (DESYREL) 50 MG tablet   Oral   Take 50 mg by mouth at bedtime.           Allergies: Allergies as of 08/11/2012  . (No Known Allergies)   Past Medical History  Diagnosis Date  . Diabetes mellitus   . Hypertension   . Coronary artery disease   . MI (myocardial infarction)    Past Surgical History  Procedure Laterality Date  . Angioplasty     Family History: No known family history.   Social History: Currently lives with wife. Current smoker, 0.5 ppd for last 30 years (15 pack year history). States he has plans to stop today given current symptoms. He has not consumed alcohol for the past 2-3 months, with  one relapse. Prior to this he reports increased alcohol consumption. Reports crack cocaine use in the past, last use 7 months ago. Denies marijuana or other illicit drug use.    Review of Systems: Patient has had some intermittent headache, chronic muscle aches, weakness in arms, intermittent abdominal pain, periodic tingling in lower extremities. He states vision worsens with elevated blood glucoses. He denies urinary symptoms including dysuria, urgency and frequency.   Physical Exam: Blood pressure 161/95, pulse 69, temperature 96.8 F (36 C), temperature source Oral, resp. rate 20, height 6' 2.5" (1.892 m), weight 79.379 kg (175 lb), SpO2 93.00%.  General: In no acute distress, sitting up on stretcher, thin Head: Normocephalic, atraumatic Eyes: PERRL, EOMI, sclera anicteric ENT: Moist nasal and oral mucosa Neck: Supple, without lymphadenopathy CV: Regular rate and rhythm, normal S1 and S2, no murmurs appreciated   Pulm: Normal work of breathing without accessory muscle use, clear to auscultation bilaterally, no crackles or wheezes .  Abdomen: + bowel sounds, soft, non-distended, non-tender Skin: Warm, without rashes Extremities: Good peripheral pulses, no edema  Neuro: alert and oriented x 3, CN II-XII grossly intact, strength 5/5 in bilateral upper extremities, 5/5 in plantar/dorsiflexion  Lab results: Basic Metabolic Panel:  Recent Labs  16/10/96 0725  NA 141  K 3.2*  CL 106  CO2 24  GLUCOSE 119*  BUN 10  CREATININE 0.83  CALCIUM 8.7   CBC:  Recent Labs  08/11/12 0725  WBC 6.9  NEUTROABS 3.6  HGB 16.1  HCT 44.9  MCV 81.8  PLT 247   BNP:  Recent Labs  08/11/12 0759  PROBNP 6134.0*   Drugs of Abuse     Component Value Date/Time   LABOPIA NONE DETECTED 08/11/2012 1118   COCAINSCRNUR POSITIVE* 08/11/2012 1118   LABBENZ NONE DETECTED 08/11/2012 1118   AMPHETMU NONE DETECTED 08/11/2012 1118   THCU NONE DETECTED 08/11/2012 1118   LABBARB NONE DETECTED 08/11/2012 1118      Imaging results:  Dg Chest 2 View  08/11/2012  *RADIOLOGY REPORT*  Clinical Data: Cough.  Chest pain.  Short of breath.  CHEST - 2 VIEW  Comparison: 11/23/2008.  Findings: The cardiopericardial silhouette appears within normal limits for size allowing for bilateral basilar airspace disease. The basilar airspace disease is asymmetric slightly greater on the right than left.  There is some volume loss at the bases as well. Fine interstitial prominence is present in the perihilar regions. Small bilateral pleural effusions.  Thickening of the fissures is present, compatible with interstitial pulmonary edema.  On the lateral view, there is consolidation of both lower lobes.  Follow-up to ensure radiographic clearing recommended.  Clearing is usually observed at 8 weeks.  IMPRESSION: 1.  Right greater than left basilar airspace disease and volume loss.  The presence on both sides suggest CHF over pneumonia however  multifocal infection is possible.  Aspiration considered less likely. 2.  Small pleural effusions and interstitial pulmonary edema, again suggesting CHF over multi focal infection.   Original Report Authenticated By: Andreas Newport, M.D.    EKG:  Rate: 70 bpm PR 152 ms QTC 488 Sinus rhythm, biphasic p waves in left atrial leads, borderlines LVH  Assessment & Plan by Problem: Principal Problem:   Shortness of breath Active Problems:   Hypoxia   Diabetes mellitus, type II   Hypertension   History of substance abuse  [name] is a 57 yo gentleman with a past medical history significant for HTN, CAD, MI and DM who presents with  a 2 day history of productive cough w/ yellow phlegm, shortness of breath and dyspnea on exertion. Concern for cardiac versus pulmonary etiology.   # Shortness of breath, productive cough: Patient dyspneic with saturations to 87% upon arrival to the ED. Have improved to 96% on 2L by nasal cannula. CXR with evidence of volume loss, small pleural effusions and interstitial pulmonary edema. No acute changes of EKG. Pro BNP elevated to 6134. UDS positive for cocaine. Differential diagnosis includes bronchitis vs COPD exacerbation, CHF, cocaine-associated heart disease and infectious processes such as pneumonia. Patient with known history of CAD s/p Cath with angioplasty (w/o stents). Less concern for acute coronary process given no acute changes on ekg, negative troponin x 1 and presentation however patient has several risk factors. Patient's UDS positive for cocaine with history of use. May have contributed to past MI and possible cardiac dysfunction. Elevated proBNP and CXR with increased vascular marking may indicate a CHF exacerbation; patient does not appear volume up on exam, no known echo. Shortness of breath, cough, smoking history also consistent with bronchitis and/or COPD exacerbation. Patient currently afebrile with a normal white count, less concerning for acute  pulmonary infection however difficult to differentiate given fluid on imaging. IV Lasix 40 initiated in ED along with methylprednisolone and nebs. Will manage as COPD exacerbation and continue work up for possible heart failure Plan -admit to telemetry -albuterol nebs q6hrs, ipratropium nebs q6hrs -doxycycline 100 mg q12s for treatment for pulmonary symptoms - IV methylprednisolone 125 mg x 1 in ED, start prednisone 40 mg po qd x 5 days  -2D echo for evaluation of possible new heart failure  -cycling troponins, istat x 1 negative  -repeat ekg in am  -repeat CXR after diuresis   -TSH pending -HgA1c pending for risk stratification -Is & Os -O2 therapy prn  -incentive spirometry -daily weights -continuing home lisinopril, holding metoprolol given concern for recent cocaine use. Consider CCB  -aspirin qd -cardiology consult pending, appreciate recommendations   # Hypokalemia: Patient hypokalemic on admission at 3.2. Potassium repletion initiated in ED 40 meq po x 1.  -repeat BMET in AM -replete electrolytes as needed   # HTN: Managed with lisinopril and metoprolol. Hypertensive in ED to 140-160s systolic.  -monitor BPs  -continuing lisinopril 5, holding metoprolol given cocaine use, consider CCB -hydralazine 5 mg q6hrs prn for SBP > 190 or DBP >95   # DM: Patient with history of DM, hospitalized Jan 2014 with hyperglycemia. Glucose in ED 119. Recent HgA1c unknown. -HgA1c pending -holding metformin -continuing home lantus 30 mg qhs + SSI -CBG monitoring before meals and qhs  #History of substance abuse: Patient with history of alcohol and cocaine abuse. Denies recent use; alcohol in last 2-3 months, cocaine in last 7 months. UDS today positive for cocaine, alcohol negative. Concern use may be contributing to previous and current heart disease.  - will monitor for symptoms of withdrawal   #Depression: Patient with history of depression. Current regimen of bupropion, sertraline and  trazodone.  - continuing home medications   # FEN/GI -heart healthy and carb modified diet -Is & Os  # PPx  -lovenox 40 mg qd  Code Status: Full code   Dispo: Deferred currently, pending improvement in patient's condition. Patient investigating possible transfer to [facility] Patient will likely need PFTs in outpatient setting.   This is a Psychologist, occupational Note.  The care of the patient was discussed with Dr. Gae Gallop and Dr. Eben Burow and the assessment and plan was  formulated with their assistance.  Please see their note for official documentation of the patient encounter.   Signed: Jiles Crocker, MS3 08/11/2012, 11:07 AM

## 2012-08-11 NOTE — H&P (Signed)
Hospital Admission Note Date: 08/11/2012  Patient name: [name] Medical record number: [id number] Date of birth: 11-27-1955 Age: 57 y.o. Gender: male PCP: Provider Not In System  Medical Service: Internal Medicine Teaching Service--[facility]  Attending physician:  Dr. Eben Burow   1st Contact: Dr. Cicero Duck 2nd Contact: Dr. Dorise Hiss    Pager:[phone] After 5 pm or weekends: 1st Contact:      Pager: [phone] 2nd Contact:      Pager: [phone]  Chief Complaint: Shortness of Breath  History of Present Illness: [name] is a 57 year old African American male with PMH of DM2, HTN, CAD, and s/p MI at age of 49 presenting to the ED today with 2 days of worsening shortness of breath and productive cough with yellow sputum.  He explains that normally he can walk at least half a mile without any complaints, however, recently even 30 feet gets him exhausted and short of breath.  He complains of increased generalized weakness, sneezing, intermittent chills, headaches x2 days.  He had mild abdominal pain and two episodes of loose BMs yesterday that have since then resolved.  He also endorses decreased appetite and approximately 40 pound weight loss over the past year without trying.  Mr. [name] denies any leg swelling, sleeps with two pillows but has been unable to sleep due to shortness of breath and cough for the past two nights.  He cannot lay flat due to worsening of his breathing, has coughing when lying on his sides, but improvement of symptoms when sitting up.  He denies any nausea or vomiting, describes mild substernal chest discomfort while coughing yesterday, and denies any abdominal pain, or urinary complaints at this time.    He is followed at the [facility] and [facility] clinic.  He smokes 1/2PPD x 30 years, has not drank alcohol in the past 3 months, and has a history of cocaine abuse (last use 7 months ago).  He apparently had a cath at the age of 84 and reports having  some blockage cleaned out.   In the ED, noted to desat to 87% on room air, placed on 2L [place] O2 improving to 93%, given IV solumedrol x1 and IV lasix 40mg  X1, and breathing treatment.   Meds: Current Outpatient Rx  Name  Route  Sig  Dispense  Refill  . buPROPion (WELLBUTRIN) 100 MG tablet   Oral   Take 100 mg by mouth at bedtime.         Marland Kitchen ibuprofen (ADVIL,MOTRIN) 200 MG tablet   Oral   Take 400-600 mg by mouth every 6 (six) hours as needed for pain.          Marland Kitchen insulin glargine (LANTUS) 100 UNIT/ML injection   Subcutaneous   Inject 30 Units into the skin at bedtime.         Marland Kitchen lisinopril (PRINIVIL,ZESTRIL) 10 MG tablet   Oral   Take 5 mg by mouth daily.         . metFORMIN (GLUCOPHAGE) 1000 MG tablet   Oral   Take 500 mg by mouth 2 (two) times daily with a meal.          . metoprolol tartrate (LOPRESSOR) 25 MG tablet   Oral   Take 25 mg by mouth 2 (two) times daily.         . sertraline (ZOLOFT) 100 MG tablet   Oral   Take 100 mg by mouth at bedtime.         Marland Kitchen  traZODone (DESYREL) 50 MG tablet   Oral   Take 50 mg by mouth at bedtime.          Allergies: Allergies as of 08/11/2012  . (No Known Allergies)   Past Medical History  Diagnosis Date  . Diabetes mellitus   . Hypertension   . Coronary artery disease   . MI (myocardial infarction)    Past Surgical History  Procedure Laterality Date  . Angioplasty     History reviewed. No pertinent family history. History   Social History  . Marital Status: Widowed    Spouse Name: N/A    Number of Children: N/A  . Years of Education: N/A   Occupational History  . Not on file.   Social History Main Topics  . Smoking status: Current Every Day Smoker -- 0.50 packs/day    Types: Cigarettes  . Smokeless tobacco: Never Used  . Alcohol Use: No  . Drug Use: No  . Sexually Active: Not on file   Other Topics Concern  . Not on file   Social History Narrative  . No narrative on file   Review of  Systems: Pertinent items are noted in HPI.  Physical Exam: Blood pressure 169/92, pulse 80, temperature 96.8 F (36 C), temperature source Oral, resp. rate 17, height 6' 2.5" (1.892 m), weight 175 lb (79.379 kg), SpO2 96.00%. Vitals reviewed. General: resting in bed, NAD, c/o generalized weakness, on [place] 2L oxygen HEENT: PERRL, EOMI, no scleral icterus, conjunctival injection Cardiac: RRR, no rubs, murmurs or gallops Pulm: clear to auscultation bilaterally, no wheezes, rales, or rhonchi Abd: soft, nontender, nondistended, BS present Ext: warm and well perfused, no pedal edema, +2dp b/l, moving all 4 extremities Neuro: alert and oriented X3, cranial nerves II-XII grossly intact, strength and sensation to light touch equal in bilateral upper and lower extremities   Lab results: Basic Metabolic Panel:  Recent Labs  40/98/11 0725  NA 141  K 3.2*  CL 106  CO2 24  GLUCOSE 119*  BUN 10  CREATININE 0.83  CALCIUM 8.7   CBC:  Recent Labs  08/11/12 0725  WBC 6.9  NEUTROABS 3.6  HGB 16.1  HCT 44.9  MCV 81.8  PLT 247   BNP:  Recent Labs  08/11/12 0759  PROBNP 6134.0*   Urine Drug Screen: Drugs of Abuse     Component Value Date/Time   LABOPIA NONE DETECTED 04/23/2008 1141   COCAINSCRNUR POSITIVE* 04/23/2008 1141   LABBENZ NONE DETECTED 04/23/2008 1141   AMPHETMU NONE DETECTED 04/23/2008 1141   THCU NONE DETECTED 04/23/2008 1141   LABBARB  Value: NONE DETECTED        DRUG SCREEN FOR MEDICAL PURPOSES ONLY.  IF CONFIRMATION IS NEEDED FOR ANY PURPOSE, NOTIFY LAB WITHIN 5 DAYS. 04/23/2008 1141    Imaging results:  Dg Chest 2 View  08/11/2012  *RADIOLOGY REPORT*  Clinical Data: Cough.  Chest pain.  Short of breath.  CHEST - 2 VIEW  Comparison: 11/23/2008.  Findings: The cardiopericardial silhouette appears within normal limits for size allowing for bilateral basilar airspace disease. The basilar airspace disease is asymmetric slightly greater on the right than left.  There is  some volume loss at the bases as well. Fine interstitial prominence is present in the perihilar regions. Small bilateral pleural effusions.  Thickening of the fissures is present, compatible with interstitial pulmonary edema.  On the lateral view, there is consolidation of both lower lobes.  Follow-up to ensure radiographic clearing recommended.  Clearing is usually  observed at 8 weeks.  IMPRESSION: 1.  Right greater than left basilar airspace disease and volume loss.  The presence on both sides suggest CHF over pneumonia however multifocal infection is possible.  Aspiration considered less likely. 2.  Small pleural effusions and interstitial pulmonary edema, again suggesting CHF over multi focal infection.   Original Report Authenticated By: Andreas Newport, M.D.    Other results: EKG: 70bpm NSR, LVH, T wave flattening in leads I and aVL  Assessment & Plan by Problem: Mr. Saiki is a 57 year old male with PMH of DM2, HTN, and CAD admitted for worsening dyspnea.      Dyspnea--presents with worsening SOB x2 days and fatigue.  Current smoker 1/2ppd and hx of cocaine abuse.  Hx of CAD as well.  Follows at the [place], so will need to obtain records.  Dyspnea could be secondary to multiple factors, primarily ?new onset CHF vs. COPD exacerbation or a combination of both.  proBNP on admission: 6134 with symptoms of orthopnea and worsening dyspnea on exertion and now at rest.  CXR on admission showed R>L basilar airspace disease and volume loss suggestive of CHF over PNA and small pleural effusions and interstitial pulmonary edema. No echo on chart review and pt denies knowledge of ever receiving an echo in the past.  In regards to ?COPD, no PFTs on chart review but possible given extensive smoking history and CXR changes suggestive in the past.  He is not on any inhalers at home.  It is possible that an ongoing viral URI might have caused an exacerbation of his symptoms at this time given his sneezing, coughing,  worsening SOB, low o2 sat at triage, and sputum production and change in quality and quantity.  PNA is also included in the differential given symptoms and CXR findings suggestive of possible multifocal infection, however, CHF more likely based on imaging.  However, he is afebrile and no leukocytosis but does complain of intermittent chills.  Finally, UDS was positive on admission for cocaine despite denial by patient of recent drug use; thus, this could be another exacerbating factor of current symptoms.  Cocaine induced vasospasm may have been cause of the chest discomfort associated with the cough he experienced yesterday but today has no chest pain.  ACS is also in consideration given current symptoms, multiple risk factors including age, smoking history, HTN, and DM2 and mild chest discomfort yesterday. PE seems less likely given modified geneva score of 3, low probability for heart rate; no recent travel, no leg pain, no tachycardia.   -admit to tele -cycle CE--troponin x1 poc 0.02 in ED -AM EKG -Echo -Cardiology consult--touched based with [place] PA Thayer Ohm.  Consider  -UDS positive for cocaine -AM labs: CMET, CBC -Risk Stratification: TSH, HbA1C, and Lipid panel -ASA -BB--hold Lopressor in setting of cocaine abuse; consider Labetalol vs Coreg -ACEi; continue Lisinopril -oxygen therapy prn, keep o2 -morphine prn pain  -received lasix 40mg  IV x1 in ED -strict I/Os -daily weights -pulse ox with vitals -albuterol and atrovent nebz q6h prn -received IV solumedrol 125mg  x1 in ED -prednisone 40mg  qd po x5 days -start doxycycline 100mg  po bid -likely will need PFTs as outpatient -consider repeat CXR in AM after diuresis on admission -tylenol prn fever    Diabetes mellitus, type II--on home regimen: Lantus 30 units QHS and Metformin 500mg  BID. Unknown recent A1C.  Glucose 119 on admission BMET.   -hold metformin -continue home dose Lantus -SSI thin -CBG monitoring    Hypertension--on  home  regimen: Lopressor 25mg  BID and Lisinopril 5mg  qd.  BP on admission 169/92 -hold Lopressor in setting of cocaine use -consider Labetalol vs. Coreg -continue Lisinopril -continue to monitor   Hypokalemia--K3.2 on admission.  Possibly secondary to dehydration in setting of decreased PO intake and loose BMs yesterday.  -replaced in ED -continue to monitor    History of substance abuse--hx of cocaine abuse, claims last use was 7 months prior but UDS on admission positive for cocaine.  Current everyday smoker 1/2ppd x30 years and last alcohol use claims was 3 months prior.   -smoking and cocaine cessation strongly advised -continue wellbutrin   Diet: Heart Healthy DVT Ppx: Lovenox Dispo: Disposition is deferred at this time, awaiting improvement of current medical problems. Anticipated discharge in approximately 1-2 day(s).   The patient does have a current PCP (Dr. Morene Crocker, [facility]), therefore will not be requiring OPC follow-up after discharge.   The patient does not have transportation limitations that hinder transportation to clinic appointments.  SignedDarden Palmer 08/11/2012, 11:26 AM

## 2012-08-11 NOTE — ED Provider Notes (Signed)
History     CSN: [id number]  Arrival date & time 08/11/12  6578   First MD Initiated Contact with Patient 08/11/12 [phone]      Chief Complaint  Patient presents with  . Shortness of Breath  . Chest Pain     HPI Patient reports difficulty breathing associated with cough and yellow phlegm over the last few days.  Been coughing up yellow phlegm and has been short of breath.  Patient is a smoker.  Has chest pain associated with a cough.  Patient had a low O2 sat at triage. Past Medical History  Diagnosis Date  . Diabetes mellitus   . Hypertension   . Coronary artery disease   . MI (myocardial infarction)     Past Surgical History  Procedure Laterality Date  . Angioplasty      History reviewed. No pertinent family history.  History  Substance Use Topics  . Smoking status: Current Every Day Smoker -- 0.50 packs/day    Types: Cigarettes  . Smokeless tobacco: Never Used  . Alcohol Use: No      Review of Systems All other systems reviewed and are negative Allergies  Review of patient's allergies indicates no known allergies.  Home Medications   Current Outpatient Rx  Name  Route  Sig  Dispense  Refill  . buPROPion (WELLBUTRIN) 100 MG tablet   Oral   Take 100 mg by mouth at bedtime.         Marland Kitchen ibuprofen (ADVIL,MOTRIN) 200 MG tablet   Oral   Take 400-600 mg by mouth every 6 (six) hours as needed for pain.          Marland Kitchen insulin glargine (LANTUS) 100 UNIT/ML injection   Subcutaneous   Inject 30 Units into the skin at bedtime.         Marland Kitchen lisinopril (PRINIVIL,ZESTRIL) 10 MG tablet   Oral   Take 5 mg by mouth daily.         . metFORMIN (GLUCOPHAGE) 1000 MG tablet   Oral   Take 500 mg by mouth 2 (two) times daily with a meal.          . metoprolol tartrate (LOPRESSOR) 25 MG tablet   Oral   Take 25 mg by mouth 2 (two) times daily.         . sertraline (ZOLOFT) 100 MG tablet   Oral   Take 100 mg by mouth at bedtime.         . traZODone (DESYREL) 50  MG tablet   Oral   Take 50 mg by mouth at bedtime.           BP 150/95  Pulse 69  Temp(Src) 96.8 F (36 C) (Oral)  Resp 20  Ht 6' 2.5" (1.892 m)  Wt 175 lb (79.379 kg)  BMI 22.17 kg/m2  SpO2 87%  Physical Exam  Nursing note and vitals reviewed. Constitutional: He is oriented to person, place, and time. He appears well-developed and well-nourished. No distress.  HENT:  Head: Normocephalic and atraumatic.  Eyes: Pupils are equal, round, and reactive to light.  Neck: Normal range of motion.  Cardiovascular: Normal rate and intact distal pulses.   Pulmonary/Chest: Effort normal. No respiratory distress. He has wheezes (Expiratory).  Abdominal: Normal appearance. He exhibits no distension.  Musculoskeletal: Normal range of motion.  Neurological: He is alert and oriented to person, place, and time. No cranial nerve deficit.  Skin: Skin is warm and dry. No rash noted.  Psychiatric: He  has a normal mood and affect. His behavior is normal.    ED Course  Procedures (including critical care time)  Date: 08/11/2012  Rate: 70  Rhythm: normal sinus rhythm  QRS Axis: normal  Intervals: normal  ST/T Wave abnormalities: normal  Conduction Disutrbances: none  Narrative Interpretation: Wondering baseline otherwise probable LVH  Meds ordered this encounter  Medications  . albuterol (PROVENTIL) (5 MG/ML) 0.5% nebulizer solution 5 mg    Sig:   . ipratropium (ATROVENT) nebulizer solution 0.5 mg    Sig:   . methylPREDNISolone sodium succinate (SOLU-MEDROL) 125 mg/2 mL injection 125 mg    Sig:   . fentaNYL (SUBLIMAZE) injection 50 mcg    Sig:   . potassium chloride 10 mEq in 100 mL IVPB    Sig:   . furosemide (LASIX) injection 40 mg    Sig:       Labs Reviewed  BASIC METABOLIC PANEL - Abnormal; Notable for the following:    Potassium 3.2 (*)    Glucose, Bld 119 (*)    All other components within normal limits  PRO B NATRIURETIC PEPTIDE - Abnormal; Notable for the following:     Pro B Natriuretic peptide (BNP) 6134.0 (*)    All other components within normal limits  CBC WITH DIFFERENTIAL  POCT I-STAT TROPONIN I   Dg Chest 2 View  08/11/2012  *RADIOLOGY REPORT*  Clinical Data: Cough.  Chest pain.  Short of breath.  CHEST - 2 VIEW  Comparison: 11/23/2008.  Findings: The cardiopericardial silhouette appears within normal limits for size allowing for bilateral basilar airspace disease. The basilar airspace disease is asymmetric slightly greater on the right than left.  There is some volume loss at the bases as well. Fine interstitial prominence is present in the perihilar regions. Small bilateral pleural effusions.  Thickening of the fissures is present, compatible with interstitial pulmonary edema.  On the lateral view, there is consolidation of both lower lobes.  Follow-up to ensure radiographic clearing recommended.  Clearing is usually observed at 8 weeks.  IMPRESSION: 1.  Right greater than left basilar airspace disease and volume loss.  The presence on both sides suggest CHF over pneumonia however multifocal infection is possible.  Aspiration considered less likely. 2.  Small pleural effusions and interstitial pulmonary edema, again suggesting CHF over multi focal infection.   Original Report Authenticated By: Andreas Newport, M.D.      1. Congestive heart failure   2. Hypoxia   3. Hypokalemia       MDM  Will plan on admitting        Nelia Shi, MD 08/11/12 [phone]

## 2012-08-12 ENCOUNTER — Inpatient Hospital Stay (HOSPITAL_COMMUNITY): Payer: Medicare Other

## 2012-08-12 ENCOUNTER — Encounter (HOSPITAL_COMMUNITY): Payer: Self-pay | Admitting: Internal Medicine

## 2012-08-12 DIAGNOSIS — I509 Heart failure, unspecified: Secondary | ICD-10-CM

## 2012-08-12 DIAGNOSIS — F1910 Other psychoactive substance abuse, uncomplicated: Secondary | ICD-10-CM

## 2012-08-12 LAB — COMPREHENSIVE METABOLIC PANEL
Albumin: 2.5 g/dL — ABNORMAL LOW (ref 3.5–5.2)
BUN: 11 mg/dL (ref 6–23)
Calcium: 8.6 mg/dL (ref 8.4–10.5)
Chloride: 104 mEq/L (ref 96–112)
Creatinine, Ser: 0.77 mg/dL (ref 0.50–1.35)
Total Bilirubin: 0.5 mg/dL (ref 0.3–1.2)
Total Protein: 6.3 g/dL (ref 6.0–8.3)

## 2012-08-12 LAB — CBC
Hemoglobin: 14.7 g/dL (ref 13.0–17.0)
MCV: 81.8 fL (ref 78.0–100.0)
Platelets: 241 10*3/uL (ref 150–400)
RBC: 5.05 MIL/uL (ref 4.22–5.81)
WBC: 8.7 10*3/uL (ref 4.0–10.5)

## 2012-08-12 LAB — HEMOGLOBIN A1C
Hgb A1c MFr Bld: 8.5 % — ABNORMAL HIGH (ref <5.7)
Mean Plasma Glucose: 197 mg/dL — ABNORMAL HIGH (ref <117)

## 2012-08-12 LAB — LIPID PANEL
HDL: 43 mg/dL (ref >39)
LDL Cholesterol: 81 mg/dL (ref 0–99)
Triglycerides: 72 mg/dL (ref <150)

## 2012-08-12 LAB — GLUCOSE, CAPILLARY

## 2012-08-12 MED ORDER — SODIUM CHLORIDE 0.9 % IV SOLN: 250.0000 mL | INTRAVENOUS | Status: DC | PRN

## 2012-08-12 MED ORDER — ASPIRIN 81 MG PO CHEW
324.0000 mg | CHEWABLE_TABLET | ORAL | Status: AC
  Administered 2012-08-13: 324 mg via ORAL
  Filled 2012-08-12: qty 4

## 2012-08-12 MED ORDER — SODIUM CHLORIDE 0.9 % IJ SOLN: 3.0000 mL | INTRAMUSCULAR | Status: DC | PRN

## 2012-08-12 MED ORDER — POTASSIUM CHLORIDE CRYS ER 20 MEQ PO TBCR
EXTENDED_RELEASE_TABLET | ORAL | Status: AC
  Filled 2012-08-12: qty 2

## 2012-08-12 MED ORDER — ALBUTEROL SULFATE (5 MG/ML) 0.5% IN NEBU: 2.5000 mg | INHALATION_SOLUTION | RESPIRATORY_TRACT | Status: DC | PRN

## 2012-08-12 MED ORDER — INSULIN ASPART 100 UNIT/ML ~~LOC~~ SOLN
0.0000 [IU] | Freq: Three times a day (TID) | SUBCUTANEOUS | Status: DC
  Administered 2012-08-12: 11 [IU] via SUBCUTANEOUS
  Administered 2012-08-13: 3 [IU] via SUBCUTANEOUS
  Administered 2012-08-14: 8 [IU] via SUBCUTANEOUS
  Administered 2012-08-14: 3 [IU] via SUBCUTANEOUS

## 2012-08-12 MED ORDER — POTASSIUM CHLORIDE CRYS ER 20 MEQ PO TBCR
40.0000 meq | EXTENDED_RELEASE_TABLET | Freq: Once | ORAL | Status: AC
  Administered 2012-08-12: 40 meq via ORAL

## 2012-08-12 MED ORDER — IPRATROPIUM BROMIDE 0.02 % IN SOLN
0.5000 mg | Freq: Two times a day (BID) | RESPIRATORY_TRACT | Status: DC
  Administered 2012-08-13 (×2): 0.5 mg via RESPIRATORY_TRACT
  Filled 2012-08-12 (×2): qty 2.5

## 2012-08-12 MED ORDER — ALBUTEROL SULFATE (5 MG/ML) 0.5% IN NEBU
2.5000 mg | INHALATION_SOLUTION | Freq: Two times a day (BID) | RESPIRATORY_TRACT | Status: DC
  Administered 2012-08-13 (×2): 2.5 mg via RESPIRATORY_TRACT
  Filled 2012-08-12 (×2): qty 0.5

## 2012-08-12 MED ORDER — SODIUM CHLORIDE 0.9 % IJ SOLN
3.0000 mL | Freq: Two times a day (BID) | INTRAMUSCULAR | Status: DC
  Administered 2012-08-12 – 2012-08-13 (×3): 3 mL via INTRAVENOUS

## 2012-08-12 NOTE — H&P (Signed)
Internal Medicine Attending Admission Note Date: 08/12/2012  Patient name: JAWAD WIACEK Medical record number: [id number] Date of birth: 03/12/1956 Age: 57 y.o. Gender: male  I saw and evaluated the patient. I reviewed the resident's note and I agree with the resident's findings and plan as documented in the resident's note.  Chief Complaint(s): Shortness of breath  History - key components related to admission: Patient is a 57 year old man with past medical history most significant for uncontrolled diabetes, hypertension, coronary artery disease status post MI 30 years ago and current smoker who presented with two-day history of worsening shortness of breath. Patient was in very good health until recently he noted that he would get extremely short of breath walking just 30 feet. Shortness of breath is also associated with generalized weakness, fever and chills for last 2 days. Patient also endorses increased sputum production which is yellow in color. He denies any orthopnea, PND, swelling in his legs but for the last 2 days he has been unable to sleep due to shortness of breath.   In the ER patient was noted to have desaturation down to 87% on room air which improved with 2 L nasal cannula. Patient was admitted to teaching service for COPD versus CHF exacerbation.  14 point review of system is negative except what is noted above.  Physical Exam - key components related to admission:  Filed Vitals:   08/11/12 2035 08/12/12 0017 08/12/12 0159 08/12/12 0532  BP: 151/87 122/72  122/77  Pulse: 72 80  73  Temp: 98.1 F (36.7 C)   97.6 F (36.4 C)  TempSrc: Oral   Oral  Resp: 18 18  18   Height:      Weight:    172 lb 9.6 oz (78.291 kg)  SpO2: 96% 95% 96% 98%  Physical Exam: General: Vital signs reviewed and noted. Well-developed, well-nourished, in no acute distress; alert, appropriate and cooperative throughout examination.  Head: Normocephalic, atraumatic.  Eyes: PERRL, EOMI, No signs  of anemia or jaundince.  Nose: Mucous membranes moist, not inflammed, nonerythematous.  Throat: Oropharynx nonerythematous, no exudate appreciated.   Neck: No deformities, masses, or tenderness noted.Supple, No carotid Bruits, no JVD.  Lungs:  Normal respiratory effort. Clear to auscultation BL without crackles or wheezes.  Heart: RRR. S1 and S2 normal without gallop, murmur, or rubs.  Abdomen:  BS normoactive. Soft, Nondistended, non-tender.  No masses or organomegaly.  Extremities: No pretibial edema.  Neurologic: A&O X3, CN II - XII are grossly intact. Motor strength is 5/5 in the all 4 extremities, Sensations intact to light touch, Cerebellar signs negative.  Skin: No visible rashes, scars.   Lab results:   Basic Metabolic Panel:  Recent Labs  13/08/65 0725 08/12/12 0600  NA 141 137  K 3.2* 3.4*  CL 106 104  CO2 24 21  GLUCOSE 119* 169*  BUN 10 11  CREATININE 0.83 0.77  CALCIUM 8.7 8.6   Liver Function Tests:  Recent Labs  08/12/12 0600  AST 27  ALT 24  ALKPHOS 72  BILITOT 0.5  PROT 6.3  ALBUMIN 2.5*   CBC:  Recent Labs  08/11/12 0725 08/12/12 0600  WBC 6.9 8.7  NEUTROABS 3.6  --   HGB 16.1 14.7  HCT 44.9 41.3  MCV 81.8 81.8  PLT 247 241   Cardiac Enzymes:  Recent Labs  08/11/12 1241 08/11/12 1759  TROPONINI <0.30 <0.30   CBG:  Recent Labs  08/11/12 1222 08/11/12 1622 08/11/12 2100 08/12/12 0543  GLUCAP 253*  344* 259* 187*   Hemoglobin A1C:  Recent Labs  08/11/12 1243  HGBA1C 8.5*   Fasting Lipid Panel:  Recent Labs  08/12/12 0600  CHOL 138  HDL 43  LDLCALC 81  TRIG 72  CHOLHDL 3.2   Thyroid Function Tests:  Recent Labs  08/11/12 1243  TSH 0.835    Alcohol Level:  Recent Labs  08/11/12 1110  ETH <11   Imaging results:  Dg Chest 2 View  08/11/2012  *RADIOLOGY REPORT*  Clinical Data: Cough.  Chest pain.  Short of breath.  CHEST - 2 VIEW  Comparison: 11/23/2008.  Findings: The cardiopericardial silhouette  appears within normal limits for size allowing for bilateral basilar airspace disease. The basilar airspace disease is asymmetric slightly greater on the right than left.  There is some volume loss at the bases as well. Fine interstitial prominence is present in the perihilar regions. Small bilateral pleural effusions.  Thickening of the fissures is present, compatible with interstitial pulmonary edema.  On the lateral view, there is consolidation of both lower lobes.  Follow-up to ensure radiographic clearing recommended.  Clearing is usually observed at 8 weeks.  IMPRESSION: 1.  Right greater than left basilar airspace disease and volume loss.  The presence on both sides suggest CHF over pneumonia however multifocal infection is possible.  Aspiration considered less likely. 2.  Small pleural effusions and interstitial pulmonary edema, again suggesting CHF over multi focal infection.   Original Report Authenticated By: Andreas Newport, M.D.     Other results: EKG: 70 beats per minute, normal axis, sinus rhythm, meets the criteria for left ventricular hypertrophy, no ST or T wave changes consistent with ischemia noted.  Assessment & Plan by Problem:  Principal Problem:   Dyspnea Active Problems:   Hypoxia   Diabetes mellitus, type II   Hypertension   History of substance abuse  Patient is a 57 year old man with past medical history most significant for coronary artery disease, type 2 diabetes, hypertension and chronic smoker who comes in with shortness of breath. Patient's shortness of breath is multifactorial at this time. Most likely diagnosis is acute exacerbation of chronic congestive heart failure(unknown cause but likely systolic) versus acute exacerbation of COPD. Patient is currently getting treated for both. Acute coronary syndrome is a possibility for his shortness of breath and we are checking cardiac enzymes and serial EKGs which have both been negative so far.  Cardiology plans to do  the right heart and left heart catheter after echocardiogram results(if ejection fraction is low). I really appreciate their help with this patient. Echo results are still pending. If patient's echo results does not show low ejection fraction, we will need to maximize his medical therapy for heart failure with preserved ejection fraction and find him a primary care physician.  Patient will be counseled on cocaine and cigarette smoking on an ongoing basis.  Rest of the medical management as per resident's note.  Lars Mage MD Faculty-Internal Medicine Residency Program

## 2012-08-12 NOTE — Progress Notes (Signed)
Consulting cardiologist: Dr. Marca Ancona  Subjective:   Patient states chest tightness feels better, breathing more easily.   Objective:   Temp:  [97.6 F (36.4 C)-98.1 F (36.7 C)] 97.6 F (36.4 C) (04/20 0532) Pulse Rate:  [72-80] 73 (04/20 0532) Resp:  [17-20] 18 (04/20 0532) BP: (122-169)/(72-95) 122/77 mmHg (04/20 0532) SpO2:  [93 %-98 %] 98 % (04/20 0532) Weight:  [172 lb 9.6 oz (78.291 kg)-172 lb 14.4 oz (78.427 kg)] 172 lb 9.6 oz (78.291 kg) (04/20 0532) Last BM Date: 08/10/12  Filed Weights   08/11/12 0634 08/11/12 1222 08/12/12 0532  Weight: 175 lb (79.379 kg) 172 lb 14.4 oz (78.427 kg) 172 lb 9.6 oz (78.291 kg)    Intake/Output Summary (Last 24 hours) at 08/12/12 0912 Last data filed at 08/12/12 0902  Gross per 24 hour  Intake   1383 ml  Output   1525 ml  Net   -142 ml   Telemetry: Sinus rhythm. . Exam:  General: No acute distress.  Lungs: Diminished but clear, nonlabored.  Cardiac: RRR, no gallop or significant murmur.  Extremities: No pitting.  Lab Results:  Basic Metabolic Panel:  Recent Labs Lab 08/11/12 0725 08/12/12 0600  NA 141 137  K 3.2* 3.4*  CL 106 104  CO2 24 21  GLUCOSE 119* 169*  BUN 10 11  CREATININE 0.83 0.77  CALCIUM 8.7 8.6    Liver Function Tests:  Recent Labs Lab 08/12/12 0600  AST 27  ALT 24  ALKPHOS 72  BILITOT 0.5  PROT 6.3  ALBUMIN 2.5*    CBC:  Recent Labs Lab 08/11/12 0725 08/12/12 0600  WBC 6.9 8.7  HGB 16.1 14.7  HCT 44.9 41.3  MCV 81.8 81.8  PLT 247 241    Cardiac Enzymes:  Recent Labs Lab 08/11/12 1241 08/11/12 1759  TROPONINI <0.30 <0.30    BNP:  Recent Labs  08/11/12 0759  PROBNP 6134.0*    ECG: Sinus rhythm with LVH, nonspecific ST changes, PVC.   Medications:   Scheduled Medications: . albuterol  2.5 mg Nebulization Q6H  . amLODipine  5 mg Oral Daily  . aspirin EC  81 mg Oral Daily  . buPROPion  100 mg Oral QHS  . doxycycline  100 mg Oral Q12H  .  enoxaparin (LOVENOX) injection  40 mg Subcutaneous Q24H  . furosemide  40 mg Intravenous BID  . insulin aspart  0-9 Units Subcutaneous TID WC  . insulin glargine  30 Units Subcutaneous QHS  . ipratropium  0.5 mg Nebulization Q6H  . lisinopril  5 mg Oral Daily  . predniSONE  40 mg Oral Q breakfast  . sertraline  100 mg Oral QHS  . sodium chloride  3 mL Intravenous Q12H  . traZODone  50 mg Oral QHS     PRN Medications:  acetaminophen, acetaminophen, hydrALAZINE, morphine injection   Assessment:   1. Shortness of breath, possibility of acute systolic heart failure being assessed. Echocardiogram is currently pending.  2. Reported history of CAD, documented age 57, questionable percutaneous intervention at that time. He is on aspirin, no active chest pain. Troponin I levels normal.  3. COPD.  4. History of substance abuse, tobacco and cocaine. UDS positive for cocaine.   Plan/Discussion:    Will followup on echocardiogram to assess LV systolic and diastolic function. Otherwise continue medical therapy and diuresis. If he has evidence of segmental wall motion abnormalities or reduced systolic function, most likely cardiac catheterization will need to be pursued. On the  other hand, if this is predominantly diastolic heart failure, would at least consider a Myoview study ultimately to screen for ischemic burden with questionable history of CAD at baseline. Suspect cocaine likely plays large part of this however.   Jonelle Sidle, M.D., F.A.C.C.

## 2012-08-12 NOTE — H&P (Signed)
I have reviewed and agree with Medical student, Jiles Crocker. Please see my separate note .   Signed: Darden Palmer, MD PGY-I, Internal Medicine Resident Pager: [phone]  08/12/2012,7:12 AM

## 2012-08-12 NOTE — Progress Notes (Signed)
Pt had 4 beats of V-tach and within a few seconds later he had another 6 beats of Vtach, pt asymptomatic, stated he did not feel anything, Ward Givens called, no new orders given at this time, will continue to monitor.

## 2012-08-12 NOTE — Progress Notes (Addendum)
Subjective: [name] was seen and examined at bedside.  He claims to be feeling much better this morning with minimal coughing and improved shortness of breath.  He slept well overnight.    Objective: Vital signs in last 24 hours: Filed Vitals:   08/12/12 0017 08/12/12 0159 08/12/12 0532 08/12/12 0900  BP: 122/72  122/77 138/80  Pulse: 80  73 83  Temp:   97.6 F (36.4 C)   TempSrc:   Oral   Resp: 18  18   Height:      Weight:   172 lb 9.6 oz (78.291 kg)   SpO2: 95% 96% 98%    Weight change: -2 lb 1.6 oz (-0.953 kg)  Intake/Output Summary (Last 24 hours) at 08/12/12 1008 Last data filed at 08/12/12 1006  Gross per 24 hour  Intake   1386 ml  Output   1525 ml  Net   -139 ml   Vitals reviewed. General: resting in bed, NAD HEENT: PERRL, EOMI, no scleral icterus Cardiac: RRR, no rubs, murmurs or gallops Pulm: clear to auscultation bilaterally, no wheezes, rales, or rhonchi Abd: soft, nontender, nondistended, BS present Ext: warm and well perfused, no pedal edema, +2 dp b/l Neuro: alert and oriented X3, cranial nerves II-XII grossly intact, strength and sensation to light touch equal in bilateral upper and lower extremities  Lab Results: Basic Metabolic Panel:  Recent Labs Lab 08/11/12 0725 08/12/12 0600  NA 141 137  K 3.2* 3.4*  CL 106 104  CO2 24 21  GLUCOSE 119* 169*  BUN 10 11  CREATININE 0.83 0.77  CALCIUM 8.7 8.6   CBC:  Recent Labs Lab 08/11/12 0725 08/12/12 0600  WBC 6.9 8.7  NEUTROABS 3.6  --   HGB 16.1 14.7  HCT 44.9 41.3  MCV 81.8 81.8  PLT 247 241   Cardiac Enzymes:  Recent Labs Lab 08/11/12 1241 08/11/12 1759  TROPONINI <0.30 <0.30   BNP:  Recent Labs Lab 08/11/12 0759  PROBNP 6134.0*   CBG:  Recent Labs Lab 08/11/12 1222 08/11/12 1622 08/11/12 2100 08/12/12 0543  GLUCAP 253* 344* 259* 187*   Hemoglobin A1C:  Recent Labs Lab 08/11/12 1243  HGBA1C 8.5*   Thyroid Function Tests:  Recent Labs Lab 08/11/12 1243   TSH 0.835   Urine Drug Screen: Drugs of Abuse     Component Value Date/Time   LABOPIA NONE DETECTED 08/11/2012 1118   COCAINSCRNUR POSITIVE* 08/11/2012 1118   LABBENZ NONE DETECTED 08/11/2012 1118   AMPHETMU NONE DETECTED 08/11/2012 1118   THCU NONE DETECTED 08/11/2012 1118   LABBARB NONE DETECTED 08/11/2012 1118    Alcohol Level:  Recent Labs Lab 08/11/12 1110  ETH <11   Studies/Results: Dg Chest 2 View  08/11/2012  *RADIOLOGY REPORT*  Clinical Data: Cough.  Chest pain.  Short of breath.  CHEST - 2 VIEW  Comparison: 11/23/2008.  Findings: The cardiopericardial silhouette appears within normal limits for size allowing for bilateral basilar airspace disease. The basilar airspace disease is asymmetric slightly greater on the right than left.  There is some volume loss at the bases as well. Fine interstitial prominence is present in the perihilar regions. Small bilateral pleural effusions.  Thickening of the fissures is present, compatible with interstitial pulmonary edema.  On the lateral view, there is consolidation of both lower lobes.  Follow-up to ensure radiographic clearing recommended.  Clearing is usually observed at 8 weeks.  IMPRESSION: 1.  Right greater than left basilar airspace disease and volume loss.  The  presence on both sides suggest CHF over pneumonia however multifocal infection is possible.  Aspiration considered less likely. 2.  Small pleural effusions and interstitial pulmonary edema, again suggesting CHF over multi focal infection.   Original Report Authenticated By: Andreas Newport, M.D.    Medications: I have reviewed the patient's current medications. Scheduled Meds: . albuterol  2.5 mg Nebulization Q6H  . amLODipine  5 mg Oral Daily  . aspirin EC  81 mg Oral Daily  . buPROPion  100 mg Oral QHS  . doxycycline  100 mg Oral Q12H  . enoxaparin (LOVENOX) injection  40 mg Subcutaneous Q24H  . furosemide  40 mg Intravenous BID  . insulin aspart  0-9 Units Subcutaneous  TID WC  . insulin glargine  30 Units Subcutaneous QHS  . ipratropium  0.5 mg Nebulization Q6H  . lisinopril  5 mg Oral Daily  . potassium chloride  40 mEq Oral Once  . predniSONE  40 mg Oral Q breakfast  . sertraline  100 mg Oral QHS  . sodium chloride  3 mL Intravenous Q12H  . traZODone  50 mg Oral QHS   Continuous Infusions:  PRN Meds:.acetaminophen, acetaminophen, hydrALAZINE, morphine injection  Assessment/Plan: [name] is a 57 year old male with PMH of DM2, HTN, and CAD admitted for worsening dyspnea.   Dyspnea--Improving.  Presented with worsening SOB x2 days and fatigue. Current smoker 1/2ppd and hx of cocaine abuse. Hx of CAD as well. Follows at the [place], so will need to obtain records. Dyspnea could be secondary to multiple factors, but likely primarily cardiac in origin ?new onset CHF (diastolic vs systolic).  proBNP on admission: 6134 with symptoms of orthopnea and worsening dyspnea on exertion and now at rest.  CXR on admission showed R>L basilar airspace disease and volume loss suggestive of CHF over PNA and small pleural effusions and interstitial pulmonary edema.  Multiple risk factors including age, smoking history, HTN, and DM2 and mild chest discomfort yesterday.  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 He also likely has an ongoing COPD exacerbation secondary to viral URI and continued smoking with symptoms of sneezing, dyspnea, cough, increased sputum production and quality, and low o2 sat at triage.  No PFTs on chart review but possible given extensive smoking history and CXR changes suggestive in the past. He is not on any inhalers at home. Finally, UDS was positive on admission for cocaine despite denial by patient of recent drug use; thus, this could be another exacerbating factor of current symptoms. Cocaine induced vasospasm may have been cause of the chest discomfort associated with the cough he experienced yesterday but today has no chest pain. ACS was also in  consideration given current symptoms, multiple risk factors including age, smoking history, HTN, and DM2 and mild chest discomfort yesterday. Cardiac enzymes have been negative.  PE seems less likely given modified geneva score of 3, low probability for heart rate; no recent travel, no leg pain, no tachycardia.  -tele monitoring -cycle CE--troponin x3 negative including poc in ED -AM EKG: 61 bpm, sinus rhythm with PVCs, LVH, t wave flattening in leads I and aVL.  -Echo done 4/19: LVEF 40-45%, akinesis of midinferoseptal, basal-midinferior, and basalanteroseptal myocardium.  Consistent with pseudonormal LV filling pattern with abnormal relaxation and increased filling pressure--grade 2 diastolic dysfunction.   -Appreciate Cardiology following--based on echo results will likely proceed with cardiac catheterization.   -UDS positive for cocaine  -Risk Stratification: TSH 0.835, HbA1C 8.5, and Lipid panel: LDL 81,  Chol 183, TG 72, HDL 43 -ASA  -BB--hold Lopressor in setting of cocaine abuse -ACEi--continue Lisinopril  -continue CCB: norvasc -oxygen therapy prn, keep o2  -morphine prn pain  -strict I/Os: net - since admission -daily weights: 175lb on admission, down to 172lb today -pulse ox with vitals  -albuterol and atrovent nebz q6h prn  -received IV solumedrol 125mg  x1 in ED  -prednisone 40mg  qd po x5 days  -continue doxycycline 100mg  po bid x5 days; today is day 2  -likely will need PFTs as outpatient  -consider repeat CXR in AM after diuresis on admission  -tylenol prn fever   Diabetes mellitus, type II--on home regimen: Lantus 30 units QHS and Metformin 500mg  BID. HbA1C 8.5 on admission.  -hold metformin  -continue home dose Lantus  -SSI thin  -CBG monitoring   Hypertension--on home regimen: Lopressor 25mg  BID and Lisinopril 5mg  qd. BP on admission 169/92  -hold BB in setting of cocaine use  -continue Norvasc 5mg  qd, Lasix 40mg  BID, and Lisinopril 5mg  qd -continue to monitor    Hypokalemia--K3.2 on admission. Possibly secondary to dehydration in setting of decreased PO intake and loose BMs prior to admission. -replaced -continue to monitor   History of substance abuse--hx of cocaine abuse, claims last use was 7 months prior but UDS on admission positive for cocaine. Current everyday smoker 1/2ppd x30 years and last alcohol use claims was 3 months prior.  -smoking and cocaine cessation strongly advised  -continue wellbutrin   Depression--home medications include: Zoloft 100mg  qhs and Trazodone 50mg  qhs and Wellbutrin 100g qhs.  Appears stable at this time.  Denies suicidal or homicidal ideation. -continue home medications  Diet: Heart Healthy, NPO after midnight for possible cath tomorrow DVT Ppx: Lovenox  Dispo: Disposition is deferred at this time, awaiting improvement of current medical problems. Anticipated discharge in approximately 1-2 day(s).  The patient does have a current PCP (Dr. Morene Crocker, [facility]), therefore will not be requiring OPC follow-up after discharge.  The patient does not have transportation limitations that hinder transportation to clinic appointments.  Services Needed at time of discharge: Y = Yes, Blank = No PT:   OT:   RN:   Equipment:   Other:     LOS: 1 day   Darden Palmer 08/12/2012, 10:08 AM

## 2012-08-12 NOTE — Progress Notes (Signed)
I have reviewed and agree with Medical student, Jiles Crocker. Please see my separate note .   Signed: Darden Palmer, MD PGY-I, Internal Medicine Resident Pager: [phone]  08/12/2012,2:38 PM

## 2012-08-12 NOTE — Progress Notes (Addendum)
Medical Student Daily Progress Note   Subjective:    Patient reports feeling much better this morning. He continues to have chest discomfort/pain, starting again at 1:00 am. He states that it is better than it was, best 7/10, worst 9/10. He reports that he was already awake when it started as trazodone has not been helping with his sleep as much. He describes the pain as pressure in the middle of his chest that does not radiate anywhere, helped by pain medications. Upon further discussion patient states that it was getting better and "not as bad as it was before", review of EKG without acute changes. Less concern for new acute process.    He further states that his cough is much improved and non-productive and he feels as though he has been breathing easier. He denies fevers/chills, feeling clammy, or nausea/vomiting. Echo completed yesterday evening.   Objective:    Vital Signs:   Temp:  [97.6 F (36.4 C)-98.1 F (36.7 C)] 97.6 F (36.4 C) (04/20 0532) Pulse Rate:  [72-80] 73 (04/20 0532) Resp:  [17-20] 18 (04/20 0532) BP: (122-169)/(72-95) 122/77 mmHg (04/20 0532) SpO2:  [93 %-98 %] 98 % (04/20 0532) Weight:  [78.291 kg (172 lb 9.6 oz)-78.427 kg (172 lb 14.4 oz)] 78.291 kg (172 lb 9.6 oz) (04/20 0532) Last BM Date: 08/10/12   Weights: 24-hour Weight change: -0.953 kg (-2 lb 1.6 oz)  Filed Weights   08/11/12 0634 08/11/12 1222 08/12/12 0532  Weight: 79.379 kg (175 lb) 78.427 kg (172 lb 14.4 oz) 78.291 kg (172 lb 9.6 oz)   Net since admission:  -1.088 kg   Intake/Output:   Intake/Output Summary (Last 24 hours) at 08/12/12 0655 Last data filed at 08/12/12 0600  Gross per 24 hour  Intake   1023 ml  Output   1525 ml  Net   -502 ml     Net since admission:  - 502 mL  Physical Exam: General: In no acute distress, resting comfortably in bed Head: Normocephalic, atraumatic  Eyes: PERRL, EOMI, sclera anicteric  ENT:  Moist nasal and oral mucosa  Neck: Supple, without  lymphadenopathy CV: Regular rate and rhythm, normal s1 and s2, no murmurs, rubs or gallops appreciated  Lungs: clear to auscultation bilaterally, normal work of breathing Abdomen: + bowel sounds soft, non-tender, non-distended Extremities:Warm, without lower extremity edema  Neuro: Alert and oriented x 3, strength 5/5 in bilateral upper and lower extremities  Skin: warm, without rashes    Labs: Basic Metabolic Panel:  Recent Labs Lab 08/11/12 0725  NA 141  K 3.2*  CL 106  CO2 24  GLUCOSE 119*  BUN 10  CREATININE 0.83  CALCIUM 8.7    CBC:  Recent Labs Lab 08/11/12 0725 08/12/12 0600  WBC 6.9 8.7  NEUTROABS 3.6  --   HGB 16.1 14.7  HCT 44.9 41.3  MCV 81.8 81.8  PLT 247 241    Cardiac Enzymes:  Recent Labs Lab 08/11/12 1241 08/11/12 1759  TROPONINI <0.30 <0.30    BNP (last 3 results):  Recent Labs  08/11/12 0759  PROBNP 6134.0*    CBG:  Recent Labs Lab 08/11/12 1222 08/11/12 1622 08/11/12 2100 08/12/12 0543  GLUCAP 253* 344* 259* 187*   Lipid Panel     Component Value Date/Time   CHOL 138 08/12/2012 0600   TRIG 72 08/12/2012 0600   HDL 43 08/12/2012 0600   CHOLHDL 3.2 08/12/2012 0600   VLDL 14 08/12/2012 0600   LDLCALC 81 08/12/2012 0600  Drugs of Abuse     Component Value Date/Time   LABOPIA NONE DETECTED 08/11/2012 1118   COCAINSCRNUR POSITIVE* 08/11/2012 1118   LABBENZ NONE DETECTED 08/11/2012 1118   AMPHETMU NONE DETECTED 08/11/2012 1118   THCU NONE DETECTED 08/11/2012 1118   LABBARB NONE DETECTED 08/11/2012 1118      Other results: EKG Results:  08/12/2012 Rate:  61 PR:  146 QRS:  96 QTc:  459 EKG: Sinus rhythm, biphasic p waves in V1 and V2, PVC x2, left ventricular hypertrophy   Imaging: Dg Chest 2 View  08/11/2012  *RADIOLOGY REPORT*  Clinical Data: Cough.  Chest pain.  Short of breath.  CHEST - 2 VIEW  Comparison: 11/23/2008.  Findings: The cardiopericardial silhouette appears within normal limits for size allowing for  bilateral basilar airspace disease. The basilar airspace disease is asymmetric slightly greater on the right than left.  There is some volume loss at the bases as well. Fine interstitial prominence is present in the perihilar regions. Small bilateral pleural effusions.  Thickening of the fissures is present, compatible with interstitial pulmonary edema.  On the lateral view, there is consolidation of both lower lobes.  Follow-up to ensure radiographic clearing recommended.  Clearing is usually observed at 8 weeks.  IMPRESSION: 1.  Right greater than left basilar airspace disease and volume loss.  The presence on both sides suggest CHF over pneumonia however multifocal infection is possible.  Aspiration considered less likely. 2.  Small pleural effusions and interstitial pulmonary edema, again suggesting CHF over multi focal infection.   Original Report Authenticated By: Andreas Newport, M.D.       Medications:    Infusions:     Scheduled Medications: . albuterol  2.5 mg Nebulization Q6H  . amLODipine  5 mg Oral Daily  . aspirin EC  81 mg Oral Daily  . buPROPion  100 mg Oral QHS  . doxycycline  100 mg Oral Q12H  . enoxaparin (LOVENOX) injection  40 mg Subcutaneous Q24H  . furosemide  40 mg Intravenous BID  . insulin aspart  0-9 Units Subcutaneous TID WC  . insulin glargine  30 Units Subcutaneous QHS  . ipratropium  0.5 mg Nebulization Q6H  . lisinopril  5 mg Oral Daily  . predniSONE  40 mg Oral Q breakfast  . sertraline  100 mg Oral QHS  . sodium chloride  3 mL Intravenous Q12H  . traZODone  50 mg Oral QHS     PRN Medications: acetaminophen, acetaminophen, hydrALAZINE, morphine injection    Assessment/ Plan:   [name] is a 57 yo gentleman with a past medical history significant for HTN, CAD, MI and DM who presents with a 2 day history of productive cough w/ yellow phlegm, shortness of breath and dyspnea on exertion. Concern for cardiac versus pulmonary etiology.   # Shortness  of breath, productive cough: Patient presented dyspneic with saturations to 87% upon arrival to the ED. Has been able to maintain good oxygenation on 2L O2 by nasal cannula. Repeat CXR this AM still was increased vascular markings, net diuresis so far -502 mL. With elevated ProBNP, heart failure is most likely diagnosis. Damage or ischemic changes to heart likely given history of CAD, previous MI and continued cocaine use. Troponins cycled and were negative. Repeat EKG with PVCs without evidence of acute process, questionable intensity of pain as patient in no acute distress and states that pain is improving. Per discussion with cardiology will proceed with possible Myoview scan or catheterization pending echo results.  Patient has had some improvement in cough and sputum production however COPD exacerbation remains likely and will continue management with diuresis.  -2D echo completed, read pending -diuresis with Lasix 40 mg IV BID  -continuing treatment with albuterol nebs q6hrs, ipratropium nebs q6hrs  -doxycycline 100 mg q12s for treatment for pulmonary symptoms  -IV methylprednisolone 125 mg x 1 in ED, start prednisone 40 mg po qd x 5 days  -troponins cycled, negative  -TSH 0.835 wnl  -HgA1c 8.5  -Is & Os; net negative -502 mL -O2 therapy prn, will wean as tolerated  -incentive spirometry  -daily weights  -continuing home lisinopril, with amlodipine 5 mg; holding metoprolol given cocaine use  -aspirin qd  -Plan per cardiology above, reviewing echo today.   # Hypokalemia: Patient hypokalemic on admission at 3.2. Potassium repletion initiated in ED 40 meq po x 1. Potassium currently 3.4. Will continue to replete  -follow BMETs -replete electrolytes as needed   # HTN: Managed with lisinopril and metoprolol. Hypertensive in ED to 140-160s systolic. Now 120-130s systolic.   -monitor BPs  -continuing lisinopril 5, holding metoprolol given cocaine use -started amlodipine 5 mg  -hydralazine 5 mg  q6hrs prn for SBP > 190 or DBP >95   # DM: Patient with history of DM, hospitalized Jan 2014 with hyperglycemia. CBGs continue to be elevated, to high 344 overnight, 169 this AM. Received 9 units of novolog overnight.   -HgA1c 8.5 -holding metformin  -continuing home lantus 30 mg qhs + SSI; consider increasing dose of lantus  -CBG monitoring before meals and qhs   #History of substance abuse: Patient with history of alcohol and cocaine abuse. Denies recent use; alcohol in last 2-3 months, cocaine in last 7 months. UDS today positive for cocaine, alcohol negative. Concern use may be contributing to previous and current heart disease.  - will monitor for symptoms of withdrawal   #Depression: Patient with history of depression. Current regimen of bupropion, sertraline and trazodone.  - continuing home medications   # FEN/GI  -heart healthy and carb modified diet  -Is & Os   # PPx  -lovenox 40 mg qd   Code Status: Full code   Dispo: Deferred currently, pending improvement in patient's condition and further work-up with cardiology. [facility] Patient will likely need PFTs in outpatient setting.    Length of Stay: 1 days   This is a Psychologist, occupational Note.  The care of the patient was discussed with Dr. Virgina Organ and Dr. Eben Burow and the assessment and plan formulated with their assistance.  Please see their attached note or addendum for official documentation of the daily encounter.  [name], MS3 08/12/2012

## 2012-08-12 NOTE — Progress Notes (Signed)
Internal Medicine Teaching Service Attending Note Date: 08/12/2012  Patient name: [name]  Medical record number: [id number]  Date of birth: May 24, 1955    This patient has been seen and discussed with the house staff. Please see their note for complete details. I concur with their findings with the following additions/corrections: Patient feels much better today. His breathing is much improved and is in good spirits. I answered all his questions regarding plan of care.   BP 138/80  Pulse 83  Temp(Src) 97.6 F (36.4 C) (Oral)  Resp 18  Ht 6' 2.5" (1.892 m)  Wt 172 lb 9.6 oz (78.291 kg)  BMI 21.87 kg/m2  SpO2 98% Gen: No acute distress Card: hepatojuklar reflex + for congestion but No JVP elevation otherwise. RRR and NO mm, Rubs or gallops Lungs: CTAB without wheezing Ext: No edema  A and P: Appreciate cardiology for their help. Plan per cardiology based on Echo results.  Optimize medical therapy for his chronic medical conditions like Diabetes, HTN and counseling regarding substance abuse. Please refer to resident note for details.    Lars Mage 08/12/2012, 10:49 AM

## 2012-08-13 ENCOUNTER — Encounter (HOSPITAL_COMMUNITY)
Admission: EM | Disposition: A | Discharge status: Home / Self Care | Payer: Self-pay | Source: Home / Self Care | Attending: Internal Medicine

## 2012-08-13 DIAGNOSIS — I2510 Atherosclerotic heart disease of native coronary artery without angina pectoris: Secondary | ICD-10-CM

## 2012-08-13 HISTORY — PX: LEFT AND RIGHT HEART CATHETERIZATION WITH CORONARY/GRAFT ANGIOGRAM: SHX5448

## 2012-08-13 LAB — BASIC METABOLIC PANEL
Chloride: 104 mEq/L (ref 96–112)
GFR calc non Af Amer: >90 mL/min (ref >90)
Glucose, Bld: 178 mg/dL — ABNORMAL HIGH (ref 70–99)
Potassium: 3.7 mEq/L (ref 3.5–5.1)
Sodium: 138 mEq/L (ref 135–145)

## 2012-08-13 LAB — POCT I-STAT 3, ART BLOOD GAS (G3+)
TCO2: 24 mmol/L (ref 0–100)
pCO2 arterial: 34.2 mmHg — ABNORMAL LOW (ref 35.0–45.0)
pH, Arterial: 7.426 (ref 7.350–7.450)

## 2012-08-13 LAB — GLUCOSE, CAPILLARY: Glucose-Capillary: 161 mg/dL — ABNORMAL HIGH (ref 70–99)

## 2012-08-13 LAB — POCT I-STAT 3, VENOUS BLOOD GAS (G3P V)
Acid-base deficit: 4 mmol/L — ABNORMAL HIGH (ref 0.0–2.0)
pCO2, Ven: 38.7 mmHg — ABNORMAL LOW (ref 45.0–50.0)
pH, Ven: 7.35 — ABNORMAL HIGH (ref 7.250–7.300)
pO2, Ven: 38 mmHg (ref 30.0–45.0)

## 2012-08-13 LAB — PROTIME-INR: INR: 1.06 (ref 0.00–1.49)

## 2012-08-13 SURGERY — LEFT AND RIGHT HEART CATHETERIZATION WITH CORONARY/GRAFT ANGIOGRAM

## 2012-08-13 MED ORDER — MIDAZOLAM HCL 2 MG/2ML IJ SOLN
INTRAMUSCULAR | Status: AC
  Filled 2012-08-13: qty 2

## 2012-08-13 MED ORDER — LIDOCAINE HCL (PF) 1 % IJ SOLN
INTRAMUSCULAR | Status: AC
  Filled 2012-08-13: qty 30

## 2012-08-13 MED ORDER — FENTANYL CITRATE 0.05 MG/ML IJ SOLN
INTRAMUSCULAR | Status: AC
  Filled 2012-08-13: qty 2

## 2012-08-13 MED ORDER — HEPARIN (PORCINE) IN NACL 2-0.9 UNIT/ML-% IJ SOLN
INTRAMUSCULAR | Status: AC
  Filled 2012-08-13: qty 500

## 2012-08-13 MED ORDER — ONDANSETRON HCL 4 MG/2ML IJ SOLN: 4.0000 mg | Freq: Four times a day (QID) | INTRAMUSCULAR | Status: DC | PRN

## 2012-08-13 MED ORDER — SODIUM CHLORIDE 0.9 % IV SOLN: INTRAVENOUS | Status: AC

## 2012-08-13 MED ORDER — HEPARIN (PORCINE) IN NACL 2-0.9 UNIT/ML-% IJ SOLN
INTRAMUSCULAR | Status: AC
  Filled 2012-08-13: qty 1000

## 2012-08-13 MED ORDER — FENTANYL CITRATE 0.05 MG/ML IJ SOLN
50.0000 ug | Freq: Once | INTRAMUSCULAR | Status: AC
  Administered 2012-08-13: 50 ug via INTRAVENOUS

## 2012-08-13 MED ORDER — FUROSEMIDE 40 MG PO TABS
40.0000 mg | ORAL_TABLET | Freq: Two times a day (BID) | ORAL | Status: DC
  Filled 2012-08-13 (×3): qty 1

## 2012-08-13 MED ORDER — LIVING WELL WITH DIABETES BOOK
Freq: Once | Status: AC
  Administered 2012-08-13: 17:00:00
  Filled 2012-08-13: qty 1

## 2012-08-13 NOTE — Progress Notes (Signed)
Inpatient Diabetes Program Recommendations  AACE/ADA: New Consensus Statement on Inpatient Glycemic Control (2013)  Target Ranges:  Prepandial:   less than 140 mg/dL      Peak postprandial:   less than 180 mg/dL (1-2 hours)      Critically ill patients:  140 - 180 mg/dL   Reason for Visit: Elevated Hbg A1C of 8.5  Note:   Hgb A1C indicates a mean glucose of 197 mg/dl prior to admission.    Patient is currently NPO and awaiting a cardiac cath.  Supportive visit at bedside.  Patient states that he is interested in learning how to better control his diabetes.  "Its gotten my attention now."  Patient is followed at the [facility].  Went to diabetes classes about 4 years ago but "I didn't pay attention."  Suggested that he request his PCP at the [place] send him for classes again and that he probably needs to attend them every 1 to 2 years to stay current.  Patient requesting diet information in particular.  Will place dietitian consult for tomorrow.  Instructed patient regarding accessing the Patient Education Channel.  Will order diabetes booklet for him also.     Results for URIE, LOUGHNER (MRN [id number]) as of 08/13/2012 13:32  Ref. Range 08/12/2012 05:43 08/12/2012 11:31 08/12/2012 15:59 08/12/2012 21:16 08/13/2012 05:47 08/13/2012 11:15  Glucose-Capillary Latest Range: 70-99 mg/dL 409 (H) 811 (H) 914 (H) 228 (H) 112 (H) 161 (H)   CBG pattern indicates that patient may benefit from meal coverage while taking prednisone.  (NPO now.)  Thank you.  Velmer Woelfel S. Elsie Lincoln, RN, CNS, CDE Inpatient Diabetes Program, team pager [phone]

## 2012-08-13 NOTE — Interval H&P Note (Signed)
History and Physical Interval Note:  08/13/2012 2:11 PM  [name]  has presented today for cardiac cath with the diagnosis of dyspnea and LV systolic dysfunction. The various methods of treatment have been discussed with the patient and family. After consideration of risks, benefits and other options for treatment, the patient has consented to  Procedure(s): LEFT HEART CATHETERIZATION WITH CORONARY ANGIOGRAM (N/A) as a surgical intervention .  The patient's history has been reviewed, patient examined, no change in status, stable for surgery.  I have reviewed the patient's chart and labs.  Questions were answered to the patient's satisfaction.     Anjolie Majer

## 2012-08-13 NOTE — Progress Notes (Signed)
Medical Student Daily Progress Note   Subjective:    [name] reports that his chest pain is doing better as is his breathing and his cough. He states that he is hungry. Patient had few beats of vtach overnight, denies experiencing any symptoms including palpitations, dizziness or headache. Review of tele reveals no recurrence after midnight. Patient with PVCs.  He will go for his cath today at 1:00 pm.  Objective:    Vital Signs:   Temp:  [97.5 F (36.4 C)-98.3 F (36.8 C)] 97.5 F (36.4 C) (04/21 0550) Pulse Rate:  [66-83] 66 (04/21 0550) Resp:  [19-20] 20 (04/21 0550) BP: (138-156)/(80-88) 142/88 mmHg (04/21 0550) SpO2:  [92 %-97 %] 96 % (04/21 0550) Weight:  [78.291 kg (172 lb 9.6 oz)] 78.291 kg (172 lb 9.6 oz) (04/21 0550) Last BM Date: 08/10/12   Weights: 24-hour Weight change: -0.136 kg (-4.8 oz)  Filed Weights   08/11/12 1222 08/12/12 0532 08/13/12 0550  Weight: 78.427 kg (172 lb 14.4 oz) 78.291 kg (172 lb 9.6 oz) 78.291 kg (172 lb 9.6 oz)   Net since admission:  - 1.088 kg   Intake/Output:   Intake/Output Summary (Last 24 hours) at 08/13/12 0754 Last data filed at 08/13/12 0200  Gross per 24 hour  Intake   1723 ml  Output   1550 ml  Net    173 ml     Net since admission:  -327 mL    Physical Exam: General:  In no acute distress, resting comfortably in bed, sleepy Eyes: PERRL, EOMI, sclera anicteric  ENT: Moist nasal and oral mucosa  Neck: Supple, without visible JVD CV: regular rate and rhythm, normal S1 and S2, no murmurs rubs or gallops appreciated.  Lungs:  clear to auscultation bilaterally, normal work of breathing Abdomen: + bowel sounds, soft, non-tender, non-distend Extremities: Warm, without edema Neuro: alert and oriented x 3, strength intact bilaterally, sensation to light touch intact in bilateral lower extremities  Skin: warm, dry, without rash    Labs: Basic Metabolic Panel:  Recent Labs Lab 08/11/12 0725 08/12/12 0600  NA 141  137  K 3.2* 3.4*  CL 106 104  CO2 24 21  GLUCOSE 119* 169*  BUN 10 11  CREATININE 0.83 0.77  CALCIUM 8.7 8.6    Liver Function Tests:  Recent Labs Lab 08/12/12 0600  AST 27  ALT 24  ALKPHOS 72  BILITOT 0.5  PROT 6.3  ALBUMIN 2.5*    CBC:  Recent Labs Lab 08/11/12 0725 08/12/12 0600  WBC 6.9 8.7  NEUTROABS 3.6  --   HGB 16.1 14.7  HCT 44.9 41.3  MCV 81.8 81.8  PLT 247 241    Cardiac Enzymes:  Recent Labs Lab 08/11/12 1241 08/11/12 1759  TROPONINI <0.30 <0.30    BNP (last 3 results):  Recent Labs  08/11/12 0759  PROBNP 6134.0*    CBG:  Recent Labs Lab 08/12/12 0543 08/12/12 1131 08/12/12 1559 08/12/12 2116 08/13/12 0547  GLUCAP 187* 287* 310* 228* 112*   Tele: Episodes of vtach overnight x 2, short runs. PVCs continue.  Imaging: Dg Chest 2 View  08/12/2012  *RADIOLOGY REPORT*  Clinical Data: Pleural effusions, CHF.  CHEST - 2 VIEW  Comparison: 08/11/2012  Findings: Mild cardiomegaly.  There are bilateral pleural effusions with bibasilar atelectasis or infiltrates.  Mild vascular congestion.  Improving airspace disease from prior study, likely improving edema.  IMPRESSION: Continued small to moderate bilateral effusions with bibasilar opacities, likely atelectasis.  Improving edema pattern.  Original Report Authenticated By: Charlett Nose, M.D.       Medications:    Infusions:     Scheduled Medications: . albuterol  2.5 mg Nebulization BID  . amLODipine  5 mg Oral Daily  . aspirin EC  81 mg Oral Daily  . buPROPion  100 mg Oral QHS  . doxycycline  100 mg Oral Q12H  . enoxaparin (LOVENOX) injection  40 mg Subcutaneous Q24H  . furosemide  40 mg Intravenous BID  . insulin aspart  0-15 Units Subcutaneous TID WC  . insulin glargine  30 Units Subcutaneous QHS  . ipratropium  0.5 mg Nebulization BID  . lisinopril  5 mg Oral Daily  . predniSONE  40 mg Oral Q breakfast  . sertraline  100 mg Oral QHS  . sodium chloride  3 mL Intravenous  Q12H  . sodium chloride  3 mL Intravenous Q12H  . traZODone  50 mg Oral QHS     PRN Medications: sodium chloride, acetaminophen, acetaminophen, albuterol, hydrALAZINE, morphine injection, sodium chloride    Assessment/ Plan:   Mr. Wieczorek is a 57 yo gentleman with a past medical history significant for HTN, CAD, MI and DM who presents with a 2 day history of productive cough w/ yellow phlegm, shortness of breath and dyspnea on exertion. Echo with EF of 40-45%, grade 2 diastolic dysfunction .    # Shortness of breath, productive cough: Patient presented dyspneic with saturations to 87% upon arrival to the ED. Has been able to maintain good oxygenation on 2L O2 by nasal cannula. With elevated ProBNP, heart failure is most likely diagnosis. Damage or ischemic changes to heart likely given history of CAD, previous MI and continued cocaine use. Echo results with EF 40-45%, akinesis of basilar areas and grade 2 diastolic dysfunction. Patient to undergo cath today. Will also continue to manage pulmonary symptoms.  -2D echo with EF at 40-45%, akinesis of basilar areas and grade 2 diastolic dysfunction.  -diuresis with Lasix 40 mg IV BID  -continuing treatment with albuterol nebs q6hrs, ipratropium nebs q6hrs  -doxycycline 100 mg q12s for treatment for pulmonary symptoms  -IV methylprednisolone 125 mg x 1 in ED, continuing prednisone 40 mg po qd x 3 more days  -troponins cycled, negative  -TSH 0.835 wnl  -HgA1c 8.5  -Is & Os; net negative -327 mL mL (1 kg). Consider fluid restriction  -O2 therapy prn, will wean as tolerated  -incentive spirometry  -daily weights  -continuing home lisinopril, with amlodipine 5 mg; holding metoprolol given cocaine use  -aspirin qd  -Plan per cardiology above, cath today.  -cont on tele to monitor for recurrence of vtach, PVCs -Will add lasix upon discharge for CHF regimen, along with new coreg and home lisinopril   # Hypokalemia: Patient hypokalemic on  admission at 3.2. Potassium repletion initiated in ED 40 meq po x 1. Potassium increased to 3.4, am BMET pending. Will continue to replete  -follow BMETs  -replete electrolytes as needed   # HTN: Managed with lisinopril and metoprolol. Hypertensive in ED to 140-160s systolic.  -monitor BPs  -continuing lisinopril 5, holding metoprolol given cocaine use  -started amlodipine 5 mg  -hydralazine 5 mg q6hrs prn for SBP > 190 or DBP >95   # DM: Patient with history of DM, hospitalized Jan 2014 with hyperglycemia. CBGs continue to be elevated overnight into the 300s, steroid administration likely contributing. Received 18 units of novolog yesterday. -HgA1c 8.5  -holding metformin  -continuing home lantus 30 mg qhs +  SSI;  -CBG monitoring before meals and qhs   #History of substance abuse: Patient with history of alcohol and cocaine abuse. Denies recent use; alcohol in last 2-3 months, cocaine in last 7 months. UDS today positive for cocaine, alcohol negative. Concern use may be contributing to previous and current heart disease.  - will monitor for symptoms of withdrawal   #Depression: Patient with history of depression. Current regimen of bupropion, sertraline and trazodone.  - continuing home medications   # FEN/GI  -heart healthy and carb modified diet  -Is & Os   # PPx  -lovenox 40 mg qd   Code Status: Full code   Dispo: Deferred currently, pending improvement in patient's condition and further work-up with cardiology. [facility] Patient will likely need PFTs in outpatient setting.    Length of Stay: 2 days   This is a Psychologist, occupational Note.  The care of the patient was discussed with Dr. Virgina Organ and Dr. Rogelia Boga and the assessment and plan formulated with their assistance.  Please see their attached note or addendum for official documentation of the daily encounter.  Jiles Crocker, MS3 08/13/2012

## 2012-08-13 NOTE — Progress Notes (Addendum)
Subjective: Mr. Carriger was seen and examined at bedside.  He is scheduled for Cath this afternoon.  He reports continued improvement with his shortness of breath but still not at baseline.  No coughing since yesterday.  He slept well overnight.  He did have 4 and then 6 beats of brief Vtach yesterday but remained asymptomatic.  Hungry this morning.   Objective: Vital signs in last 24 hours: Filed Vitals:   08/12/12 1956 08/13/12 0550 08/13/12 0845 08/13/12 0945  BP:  142/88  135/86  Pulse:  66    Temp:  97.5 F (36.4 C)    TempSrc:  Oral    Resp:  20    Height:      Weight:  172 lb 9.6 oz (78.291 kg)    SpO2: 97% 96% 97%    Weight change: -4.8 oz (-0.136 kg)  Intake/Output Summary (Last 24 hours) at 08/13/12 1043 Last data filed at 08/13/12 0817  Gross per 24 hour  Intake   1120 ml  Output   1550 ml  Net   -430 ml   Vitals reviewed. General: resting in bed, NAD HEENT: PERRL, EOMI, no scleral icterus Cardiac: RRR, no rubs, murmurs or gallops Pulm: clear to auscultation bilaterally, no wheezes, rales, or rhonchi Abd: soft, nontender, nondistended, BS present Ext: warm and well perfused, no pedal edema, +2 dp b/l Neuro: alert and oriented X3, cranial nerves II-XII grossly intact, strength and sensation to light touch equal in bilateral upper and lower extremities  Lab Results: Basic Metabolic Panel:  Recent Labs Lab 08/11/12 0725 08/12/12 0600  NA 141 137  K 3.2* 3.4*  CL 106 104  CO2 24 21  GLUCOSE 119* 169*  BUN 10 11  CREATININE 0.83 0.77  CALCIUM 8.7 8.6   CBC:  Recent Labs Lab 08/11/12 0725 08/12/12 0600  WBC 6.9 8.7  NEUTROABS 3.6  --   HGB 16.1 14.7  HCT 44.9 41.3  MCV 81.8 81.8  PLT 247 241   Cardiac Enzymes:  Recent Labs Lab 08/11/12 1241 08/11/12 1759  TROPONINI <0.30 <0.30   BNP:  Recent Labs Lab 08/11/12 0759  PROBNP 6134.0*   CBG:  Recent Labs Lab 08/11/12 2100 08/12/12 0543 08/12/12 1131 08/12/12 1559 08/12/12 2116  08/13/12 0547  GLUCAP 259* 187* 287* 310* 228* 112*   Hemoglobin A1C:  Recent Labs Lab 08/11/12 1243  HGBA1C 8.5*   Thyroid Function Tests:  Recent Labs Lab 08/11/12 1243  TSH 0.835   Urine Drug Screen: Drugs of Abuse     Component Value Date/Time   LABOPIA NONE DETECTED 08/11/2012 1118   COCAINSCRNUR POSITIVE* 08/11/2012 1118   LABBENZ NONE DETECTED 08/11/2012 1118   AMPHETMU NONE DETECTED 08/11/2012 1118   THCU NONE DETECTED 08/11/2012 1118   LABBARB NONE DETECTED 08/11/2012 1118    Alcohol Level:  Recent Labs Lab 08/11/12 1110  ETH <11   Studies/Results: Dg Chest 2 View  08/12/2012  *RADIOLOGY REPORT*  Clinical Data: Pleural effusions, CHF.  CHEST - 2 VIEW  Comparison: 08/11/2012  Findings: Mild cardiomegaly.  There are bilateral pleural effusions with bibasilar atelectasis or infiltrates.  Mild vascular congestion.  Improving airspace disease from prior study, likely improving edema.  IMPRESSION: Continued small to moderate bilateral effusions with bibasilar opacities, likely atelectasis.  Improving edema pattern.   Original Report Authenticated By: Charlett Nose, M.D.    Medications: I have reviewed the patient's current medications. Scheduled Meds: . albuterol  2.5 mg Nebulization BID  . amLODipine  5 mg  Oral Daily  . aspirin EC  81 mg Oral Daily  . buPROPion  100 mg Oral QHS  . doxycycline  100 mg Oral Q12H  . enoxaparin (LOVENOX) injection  40 mg Subcutaneous Q24H  . furosemide  40 mg Intravenous BID  . insulin aspart  0-15 Units Subcutaneous TID WC  . insulin glargine  30 Units Subcutaneous QHS  . ipratropium  0.5 mg Nebulization BID  . lisinopril  5 mg Oral Daily  . predniSONE  40 mg Oral Q breakfast  . sertraline  100 mg Oral QHS  . sodium chloride  3 mL Intravenous Q12H  . sodium chloride  3 mL Intravenous Q12H  . traZODone  50 mg Oral QHS   Continuous Infusions:  PRN Meds:.sodium chloride, acetaminophen, acetaminophen, albuterol, hydrALAZINE, morphine  injection, sodium chloride  Assessment/Plan: Mr. Space is a 57 year old male with PMH of DM2, HTN, and CAD admitted for worsening dyspnea.   Dyspnea--Improving.  Presented with worsening SOB x2 days and fatigue. Current smoker 1/2ppd and hx of cocaine abuse. Hx of CAD as well. Follows at the [place], so will need to obtain records. Dyspnea could be secondary to multiple factors, but likely primarily cardiac in origin ?new onset CHF (diastolic vs systolic).  proBNP on admission: 6134 with symptoms of orthopnea and worsening dyspnea on exertion and now at rest.  CXR on admission showed R>L basilar airspace disease and volume loss suggestive of CHF over PNA and small pleural effusions and interstitial pulmonary edema.  Multiple risk factors including age, smoking history, HTN, and DM2 and mild chest discomfort yesterday.  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 -Echo done 4/19: LVEF 40-45%, akinesis of midinferoseptal, basal-midinferior, and basalanteroseptal myocardium.  Consistent with pseudonormal LV filling pattern with abnormal relaxation and increased filling pressure--grade 2 diastolic dysfunction.  He also likely had ongoing COPD exacerbation secondary to viral URI and continued smoking that is much improved since admission on steroids and antibiotics. Repeat CXR on 08/12/12: improving edema patter, continued small to moderate b/l effusions with bibasilar opacities.  Finally, UDS was positive on admission for cocaine despite denial by patient of recent drug use; thus, this could be another exacerbating factor of initial symptoms.   -tele monitoring -cycle CE--troponin x3 negative including poc in ED -AM EKG: 61 bpm, sinus rhythm with PVCs, LVH, t wave flattening in leads I and aVL.  -Appreciate Cardiology following--cath today -Risk Stratification: TSH 0.835, HbA1C 8.5, and Lipid panel: LDL 81, Chol 183, TG 72, HDL 43 -ASA  -BB--hold Lopressor in setting of cocaine abuse; consider coreg at  discharge -ACEi--continue Lisinopril  -continue CCB: norvasc -IV lasix 40mg  BID--consider transition to PO -oxygen therapy prn, keep o2  -morphine prn pain  -strict I/Os: net - since admission, but has been urinating in toilet so values may not be accurate -daily weights: 175lb on admission, down to 172lb today--weight stable at 172 -pulse ox with vitals  -albuterol and atrovent nebz q6h prn  -prednisone 40mg  qd po x5 days--day 3 of steroids  -continue doxycycline 100mg  po bid x5 days; today is day 3  -likely will need PFTs as outpatient  -tylenol prn fever   Diabetes mellitus, type II--on home regimen: Lantus 30 units QHS and Metformin 500mg  BID. HbA1C 8.5 on admission.  -hold metformin  -continue home dose Lantus  -SSI thin  -CBG monitoring   Hypertension--on home regimen: Lopressor 25mg  BID and Lisinopril 5mg  qd. BP on admission 169/92  -hold BB in setting of cocaine  use  -continue Norvasc 5mg  qd, Lasix 40mg  BID, and Lisinopril 5mg  qd -continue to monitor   Hypokalemia--K3.2 on admission. Possibly secondary to dehydration in setting of decreased PO intake and loose BMs prior to admission. -replaced -continue to monitor   History of substance abuse--hx of cocaine abuse, claims last use was 7 months prior but UDS on admission positive for cocaine. Current everyday smoker 1/2ppd x30 years and last alcohol use claims was 3 months prior.  -smoking and cocaine cessation strongly advised  -continue wellbutrin   Depression--home medications include: Zoloft 100mg  qhs and Trazodone 50mg  qhs and Wellbutrin 100g qhs.  Appears stable at this time.  Denies suicidal or homicidal ideation. -continue home medications  Diet: Heart Healthy, NPO for cath DVT Ppx: Lovenox  Dispo: Disposition is deferred at this time, awaiting improvement of current medical problems. Anticipated discharge in approximately 1-2 day(s).  The patient does have a current PCP (Dr. Morene Crocker, [facility]), therefore  will not be requiring OPC follow-up after discharge.  The patient does not have transportation limitations that hinder transportation to clinic appointments.  Services Needed at time of discharge: Y = Yes, Blank = No PT:   OT:   RN:   Equipment:   Other:     LOS: 2 days   Darden Palmer 08/13/2012, 10:43 AM

## 2012-08-13 NOTE — Progress Notes (Signed)
I have reviewed and agree with Medical student Jiles Crocker. Please see my separate note .   Signed: Darden Palmer, MD PGY-I, Internal Medicine Resident Pager: [phone]  08/13/2012,3:43 PM

## 2012-08-13 NOTE — Progress Notes (Signed)
Internal Medicine Teaching Service Attending Note Date: 08/13/2012  Patient name: [name]  Medical record number: [id number]  Date of birth: 12/26/1955    This patient has been seen and discussed with the house staff. Please see their note for complete details. I concur with their findings with the following additions/corrections: Breathing is better but not at baseline. Lungs are clear with good air movement. For cath in early afternoon bc ECHO EF 40-45% and regional wall motion abnl. On COPD tx with steroids, ABX, nebs. On CHF tx with lasix and ACEI. Will need BB added prior to / at D/C.  [facility] 08/13/2012, 11:43 AM

## 2012-08-13 NOTE — Progress Notes (Signed)
To cath lab per bed.

## 2012-08-13 NOTE — Progress Notes (Signed)
I have reviewed and agree with Medical student Jiles Crocker. Please see my separate note.    Signed: Darden Palmer, MD PGY-I, Internal Medicine Resident Pager: [phone]  08/13/2012,2:24 PM

## 2012-08-13 NOTE — H&P (View-Only) (Signed)
 Consulting cardiologist: Dr. Dalton McLean  Subjective:   Patient states chest tightness feels better, breathing more easily.   Objective:   Temp:  [97.6 F (36.4 C)-98.1 F (36.7 C)] 97.6 F (36.4 C) (04/20 0532) Pulse Rate:  [72-80] 73 (04/20 0532) Resp:  [17-20] 18 (04/20 0532) BP: (122-169)/(72-95) 122/77 mmHg (04/20 0532) SpO2:  [93 %-98 %] 98 % (04/20 0532) Weight:  [172 lb 9.6 oz (78.291 kg)-172 lb 14.4 oz (78.427 kg)] 172 lb 9.6 oz (78.291 kg) (04/20 0532) Last BM Date: 08/10/12  Filed Weights   08/11/12 0634 08/11/12 1222 08/12/12 0532  Weight: 175 lb (79.379 kg) 172 lb 14.4 oz (78.427 kg) 172 lb 9.6 oz (78.291 kg)    Intake/Output Summary (Last 24 hours) at 08/12/12 0912 Last data filed at 08/12/12 0902  Gross per 24 hour  Intake   1383 ml  Output   1525 ml  Net   -142 ml   Telemetry: Sinus rhythm. . Exam:  General: No acute distress.  Lungs: Diminished but clear, nonlabored.  Cardiac: RRR, no gallop or significant murmur.  Extremities: No pitting.  Lab Results:  Basic Metabolic Panel:  Recent Labs Lab 08/11/12 0725 08/12/12 0600  NA 141 137  K 3.2* 3.4*  CL 106 104  CO2 24 21  GLUCOSE 119* 169*  BUN 10 11  CREATININE 0.83 0.77  CALCIUM 8.7 8.6    Liver Function Tests:  Recent Labs Lab 08/12/12 0600  AST 27  ALT 24  ALKPHOS 72  BILITOT 0.5  PROT 6.3  ALBUMIN 2.5*    CBC:  Recent Labs Lab 08/11/12 0725 08/12/12 0600  WBC 6.9 8.7  HGB 16.1 14.7  HCT 44.9 41.3  MCV 81.8 81.8  PLT 247 241    Cardiac Enzymes:  Recent Labs Lab 08/11/12 1241 08/11/12 1759  TROPONINI <0.30 <0.30    BNP:  Recent Labs  08/11/12 0759  PROBNP 6134.0*    ECG: Sinus rhythm with LVH, nonspecific ST changes, PVC.   Medications:   Scheduled Medications: . albuterol  2.5 mg Nebulization Q6H  . amLODipine  5 mg Oral Daily  . aspirin EC  81 mg Oral Daily  . buPROPion  100 mg Oral QHS  . doxycycline  100 mg Oral Q12H  .  enoxaparin (LOVENOX) injection  40 mg Subcutaneous Q24H  . furosemide  40 mg Intravenous BID  . insulin aspart  0-9 Units Subcutaneous TID WC  . insulin glargine  30 Units Subcutaneous QHS  . ipratropium  0.5 mg Nebulization Q6H  . lisinopril  5 mg Oral Daily  . predniSONE  40 mg Oral Q breakfast  . sertraline  100 mg Oral QHS  . sodium chloride  3 mL Intravenous Q12H  . traZODone  50 mg Oral QHS     PRN Medications:  acetaminophen, acetaminophen, hydrALAZINE, morphine injection   Assessment:   1. Shortness of breath, possibility of acute systolic heart failure being assessed. Echocardiogram is currently pending.  2. Reported history of CAD, documented age 57, questionable percutaneous intervention at that time. He is on aspirin, no active chest pain. Troponin I levels normal.  3. COPD.  4. History of substance abuse, tobacco and cocaine. UDS positive for cocaine.   Plan/Discussion:    Will followup on echocardiogram to assess LV systolic and diastolic function. Otherwise continue medical therapy and diuresis. If he has evidence of segmental wall motion abnormalities or reduced systolic function, most likely cardiac catheterization will need to be pursued. On the   other hand, if this is predominantly diastolic heart failure, would at least consider a Myoview study ultimately to screen for ischemic burden with questionable history of CAD at baseline. Suspect cocaine likely plays large part of this however.   Shacoya Burkhammer G. Naje Rice, M.D., F.A.C.C.  

## 2012-08-13 NOTE — CV Procedure (Signed)
    Cardiac Catheterization Operative Report  LORENCE NAGENGAST [id number] 4/21/20143:30 PM Provider Not In System  Procedure Performed:  1. Left Heart Catheterization 2. Selective Coronary Angiography 3. Right Heart Catheterization 4. Left ventricular pressures  Operator: Verne Carrow, MD  Indication: 57 yo male with history of  DM, HTN, cocaine abuse, etoh abuse, tobacco abuse admitted with dyspnea and found to have segmental LV systolic dysfunction. No chest pain or elevation of cardiac markers.                              Procedure Details: The risks, benefits, complications, treatment options, and expected outcomes were discussed with the patient. The patient and/or family concurred with the proposed plan, giving informed consent. The patient was brought to the cath lab after IV hydration was begun and oral premedication was given. The patient was further sedated with Versed and Fentanyl. The right groin was prepped and draped in the usual manner. Using the modified Seldinger access technique, a 5 French sheath was placed in the femoral artery. A 7 French sheath was inserted into the right femoral vein. A balloon tipped catheter was used to perform a right heart catheterization. A JL4 catheter was used to selectively engage the left main artery. Left coronary angiography was performed. I was unable to selectively engage the right coronary artery due to an anterior takeoff. I used a JR4, NoTorque Right, AR1, AR2, AL1, 3DRC, multi-purpose. I was able to visualize the vessel with non-selective angiography. A pigtail catheter was used to measure LV pressures.There were no immediate complications. The patient was taken to the recovery area in stable condition.   Hemodynamic Findings: Ao: 142/91         LV: 147/12/18 RA: 6  RV: 44/2/7 PA: 41/19 (mean 28)        PCWP: 13 Fick Cardiac Output: 5.96 L/min Fick Cardiac Index: 2.89 L/min/m2 Central Aortic Saturation: 93% Pulmonary  Artery Saturation: 70%  Angiographic Findings:  Left main: No obstructive disease noted.   Left Anterior Descending Artery: Large caliber vessel that courses to the apex. The proximal vessel has a long segment of 50% stenosis but this does not appear to be flow limiting.   Circumflex Artery: Large caliber vessel with 30% ostial stenosis. The first obtuse marginal branch is large in caliber and has no focally obstructive disease. There is a small to moderate caliber ramus intermediate branch with 40% ostial stenosis.   Right Coronary Artery: Large dominant vessel with 30% proximal stenosis. The distal vessel, PDA and posterolateral branches are patent with no obstructive disease noted.   Left Ventricular Angiogram: Deferred.   Impression: 1. Moderate non-obstructive CAD 2. Non-ischemic cardiomyopathy  Recommendations: He will need aggressive risk factor reduction with complete cessation of tobacco, alcohol and cocaine. Continue statin and medical therapy for his cardiomyopathy. Should be ok to discharge in am. He can f/u in our office with Dr. Shirlee Latch in several weeks.        Complications:  None; patient tolerated the procedure well.

## 2012-08-13 NOTE — Progress Notes (Signed)
INITIAL NUTRITION ASSESSMENT  DOCUMENTATION CODES Per approved criteria  -Not Applicable   INTERVENTION: 1. RD will follow up with education post cardiac cath   NUTRITION DIAGNOSIS: Unintentional weight loss related to uncontrolled DM? as evidenced by weight hx.   Goal: PO intake to meet >/=90% estimated nutrition needs with appropriate blood sugar control  Monitor:  PO intake, weight take, labs  Reason for Assessment: Malnutrition Screening Tool  57 y.o. male  Admitting Dx: Dyspnea  ASSESSMENT: Pt admitted with increased SOB, hx of DM, HTN, CAD, and substance abuse. Per notes, pt reports that he has lost about 35-40 lbs in the past year.   RD also consulted for diet education. Planned for cardiac cath today, will wait for diet education until pt closer to d/c.   Reports UBW is 216 lbs. Weight hx from 8 months shows 13 lb weight loss (185 lbs in August). No weights on file prior to that. Pt has a good appetite, eats 3 meals daily.    Height: Ht Readings from Last 1 Encounters:  08/11/12 6' 2.5" (1.892 m)    Weight: Wt Readings from Last 1 Encounters:  08/13/12 172 lb 9.6 oz (78.291 kg)    Ideal Body Weight: 190 lbs   % Ideal Body Weight: 90%  Wt Readings from Last 10 Encounters:  08/13/12 172 lb 9.6 oz (78.291 kg)  08/13/12 172 lb 9.6 oz (78.291 kg)  04/30/12 170 lb (77.111 kg)  11/29/11 185 lb (83.915 kg)    Usual Body Weight: 185 lbs per weight hx   % Usual Body Weight: 93%  BMI:  Body mass index is 21.87 kg/(m^2). WNL   Estimated Nutritional Needs: Kcal: 2000-2200 Protein: 60-70 gm  Fluid: 2-2.2 L   Skin: intact   Diet Order: NPO  EDUCATION NEEDS: -Education not appropriate at this time   Intake/Output Summary (Last 24 hours) at 08/13/12 1220 Last data filed at 08/13/12 0817  Gross per 24 hour  Intake   1120 ml  Output    900 ml  Net    220 ml    Last BM: PTA   Labs:   Recent Labs Lab 08/11/12 0725 08/12/12 0600 08/13/12 1049   NA 141 137 138  K 3.2* 3.4* 3.7  CL 106 104 104  CO2 24 21 22   BUN 10 11 13   CREATININE 0.83 0.77 0.68  CALCIUM 8.7 8.6 8.6  GLUCOSE 119* 169* 178*    CBG (last 3)   Recent Labs  08/12/12 2116 08/13/12 0547 08/13/12 1115  GLUCAP 228* 112* 161*   Lab Results  Component Value Date   HGBA1C 8.5* 08/11/2012    Scheduled Meds: . albuterol  2.5 mg Nebulization BID  . amLODipine  5 mg Oral Daily  . aspirin EC  81 mg Oral Daily  . buPROPion  100 mg Oral QHS  . doxycycline  100 mg Oral Q12H  . enoxaparin (LOVENOX) injection  40 mg Subcutaneous Q24H  . furosemide  40 mg Intravenous BID  . insulin aspart  0-15 Units Subcutaneous TID WC  . insulin glargine  30 Units Subcutaneous QHS  . ipratropium  0.5 mg Nebulization BID  . lisinopril  5 mg Oral Daily  . predniSONE  40 mg Oral Q breakfast  . sertraline  100 mg Oral QHS  . sodium chloride  3 mL Intravenous Q12H  . sodium chloride  3 mL Intravenous Q12H  . traZODone  50 mg Oral QHS    Continuous Infusions:   Past  Medical History  Diagnosis Date  . Diabetes mellitus   . Hypertension   . Coronary artery disease   . MI (myocardial infarction) 53    age 86 per patient    Past Surgical History  Procedure Laterality Date  . Angioplasty  1984    at age 57 [address], [place] Pager [phone] After Hours pager [phone]

## 2012-08-14 DIAGNOSIS — I5041 Acute combined systolic (congestive) and diastolic (congestive) heart failure: Secondary | ICD-10-CM | POA: Diagnosis present

## 2012-08-14 DIAGNOSIS — I2510 Atherosclerotic heart disease of native coronary artery without angina pectoris: Secondary | ICD-10-CM

## 2012-08-14 DIAGNOSIS — I428 Other cardiomyopathies: Secondary | ICD-10-CM | POA: Diagnosis present

## 2012-08-14 LAB — GLUCOSE, CAPILLARY
Glucose-Capillary: 77 mg/dL (ref 70–99)
Glucose-Capillary: 266 mg/dL — ABNORMAL HIGH (ref 70–99)

## 2012-08-14 LAB — BASIC METABOLIC PANEL
BUN: 13 mg/dL (ref 6–23)
CO2: 27 mEq/L (ref 19–32)
Chloride: 104 mEq/L (ref 96–112)
Creatinine, Ser: 0.8 mg/dL (ref 0.50–1.35)
GFR calc Af Amer: >90 mL/min (ref >90)
Glucose, Bld: 87 mg/dL (ref 70–99)
Potassium: 3.3 mEq/L — ABNORMAL LOW (ref 3.5–5.1)

## 2012-08-14 MED ORDER — FUROSEMIDE 40 MG PO TABS: 40.0000 mg | ORAL_TABLET | Freq: Every day | ORAL | Status: DC

## 2012-08-14 MED ORDER — POTASSIUM CHLORIDE ER 10 MEQ PO TBCR: 10.0000 meq | EXTENDED_RELEASE_TABLET | Freq: Every day | ORAL | Status: DC

## 2012-08-14 MED ORDER — DOXYCYCLINE HYCLATE 100 MG PO TABS: 100.0000 mg | ORAL_TABLET | Freq: Two times a day (BID) | ORAL | Status: AC

## 2012-08-14 MED ORDER — POTASSIUM CHLORIDE CRYS ER 20 MEQ PO TBCR
40.0000 meq | EXTENDED_RELEASE_TABLET | Freq: Once | ORAL | Status: AC
  Administered 2012-08-14: 40 meq via ORAL
  Filled 2012-08-14: qty 2

## 2012-08-14 MED ORDER — LISINOPRIL 10 MG PO TABS
10.0000 mg | ORAL_TABLET | Freq: Every day | ORAL | Status: DC
  Administered 2012-08-14: 10 mg via ORAL
  Filled 2012-08-14: qty 1

## 2012-08-14 MED ORDER — FUROSEMIDE 40 MG PO TABS
40.0000 mg | ORAL_TABLET | Freq: Every day | ORAL | Status: DC
  Administered 2012-08-14: 40 mg via ORAL
  Filled 2012-08-14: qty 1

## 2012-08-14 MED ORDER — ASPIRIN 81 MG PO TBEC: 81.0000 mg | DELAYED_RELEASE_TABLET | Freq: Every day | ORAL | Status: DC

## 2012-08-14 MED ORDER — LISINOPRIL 10 MG PO TABS: 10.0000 mg | ORAL_TABLET | Freq: Every day | ORAL | Status: DC

## 2012-08-14 MED ORDER — PRAVASTATIN SODIUM 10 MG PO TABS: 10.0000 mg | ORAL_TABLET | Freq: Every day | ORAL | Status: DC

## 2012-08-14 MED ORDER — PREDNISONE 20 MG PO TABS: 40.0000 mg | ORAL_TABLET | Freq: Every day | ORAL | Status: AC

## 2012-08-14 NOTE — Progress Notes (Signed)
Subjective: [name] was seen and examined at bedside.  He is s/p Cath yesterday which showed non-obstructive CAD and non-ischemic cardiomyopathy. Today, he feels that his breathing his back to baseline, no complaints of cough, and endorses mild mid chest soreness that he says he has been having for some time after accidentally hitting that area against a coffee table at home.  He has slight right groin soreness post cath but is able to walk.  He agrees to follow up with PCP and also our [facility] clinic if needed.  He will see Dr. Shirlee Latch as an outpatient.  We discussed cessation of all substances including Cocaine, alcohol, and tobacco along with improved diabetic control.    He denies any fever, chills, N/V/D, headaches, shortness of breath, abdominal pain, or any urinary complaints at this time.    Objective: Vital signs in last 24 hours: Filed Vitals:   08/13/12 1938 08/13/12 2022 08/13/12 2125 08/14/12 0448  BP: 156/95 146/85  142/90  Pulse: 78 99  58  Temp: 98.1 F (36.7 C) 98 F (36.7 C)  97.4 F (36.3 C)  TempSrc: Oral Oral  Oral  Resp:    20  Height:      Weight:    172 lb (78.019 kg)  SpO2: 97% 97% 93% 94%   Weight change: -9.6 oz (-0.272 kg)  Intake/Output Summary (Last 24 hours) at 08/14/12 0720 Last data filed at 08/14/12 0622  Gross per 24 hour  Intake   1100 ml  Output   1620 ml  Net   -520 ml   Vitals reviewed. General: resting in bed, NAD HEENT: PERRL, EOMI, no scleral icterus Cardiac: RRR, no rubs, murmurs or gallops Pulm: clear to auscultation bilaterally, no wheezes, rales, or rhonchi Abd: soft, nontender, nondistended, BS present Ext: warm and well perfused, no pedal edema, +2 dp b/l.  C/D/I right groin dressing in place, tender to palpation around dressing.  Neuro: alert and oriented X3, cranial nerves II-XII grossly intact, strength and sensation to light touch equal in bilateral upper and lower extremities  Lab Results: Basic Metabolic Panel:  Recent  Labs Lab 08/13/12 1049 08/14/12 0510  NA 138 142  K 3.7 3.3*  CL 104 104  CO2 22 27  GLUCOSE 178* 87  BUN 13 13  CREATININE 0.68 0.80  CALCIUM 8.6 8.7   CBC:  Recent Labs Lab 08/11/12 0725 08/12/12 0600  WBC 6.9 8.7  NEUTROABS 3.6  --   HGB 16.1 14.7  HCT 44.9 41.3  MCV 81.8 81.8  PLT 247 241   Cardiac Enzymes:  Recent Labs Lab 08/11/12 1241 08/11/12 1759  TROPONINI <0.30 <0.30   BNP:  Recent Labs Lab 08/11/12 0759  PROBNP 6134.0*   CBG:  Recent Labs Lab 08/12/12 2116 08/13/12 0547 08/13/12 1115 08/13/12 1539 08/13/12 2108 08/14/12 0547  GLUCAP 228* 112* 161* 161* 244* 77   Hemoglobin A1C:  Recent Labs Lab 08/11/12 1243  HGBA1C 8.5*   Thyroid Function Tests:  Recent Labs Lab 08/11/12 1243  TSH 0.835   Urine Drug Screen: Drugs of Abuse     Component Value Date/Time   LABOPIA NONE DETECTED 08/11/2012 1118   COCAINSCRNUR POSITIVE* 08/11/2012 1118   LABBENZ NONE DETECTED 08/11/2012 1118   AMPHETMU NONE DETECTED 08/11/2012 1118   THCU NONE DETECTED 08/11/2012 1118   LABBARB NONE DETECTED 08/11/2012 1118    Alcohol Level:  Recent Labs Lab 08/11/12 1110  ETH <11   Studies/Results: Dg Chest 2 View  08/12/2012  *RADIOLOGY  REPORT*  Clinical Data: Pleural effusions, CHF.  CHEST - 2 VIEW  Comparison: 08/11/2012  Findings: Mild cardiomegaly.  There are bilateral pleural effusions with bibasilar atelectasis or infiltrates.  Mild vascular congestion.  Improving airspace disease from prior study, likely improving edema.  IMPRESSION: Continued small to moderate bilateral effusions with bibasilar opacities, likely atelectasis.  Improving edema pattern.   Original Report Authenticated By: Charlett Nose, M.D.    Medications: I have reviewed the patient's current medications. Scheduled Meds: . albuterol  2.5 mg Nebulization BID  . amLODipine  5 mg Oral Daily  . aspirin EC  81 mg Oral Daily  . buPROPion  100 mg Oral QHS  . doxycycline  100 mg Oral Q12H   . furosemide  40 mg Oral BID  . insulin aspart  0-15 Units Subcutaneous TID WC  . insulin glargine  30 Units Subcutaneous QHS  . ipratropium  0.5 mg Nebulization BID  . lisinopril  5 mg Oral Daily  . potassium chloride  40 mEq Oral Once  . predniSONE  40 mg Oral Q breakfast  . sertraline  100 mg Oral QHS  . sodium chloride  3 mL Intravenous Q12H  . traZODone  50 mg Oral QHS   Continuous Infusions:  PRN Meds:.acetaminophen, acetaminophen, albuterol, hydrALAZINE, morphine injection, ondansetron (ZOFRAN) IV  Assessment/Plan: Mr. Youngblood is a 57 year old male with PMH of DM2, HTN, and CAD admitted for worsening dyspnea.   Dyspnea--Improving.  Likely secondary to acute systolic and diastolic CHF with proBNP of 6134 on admission.  CXR on admission showed R>L basilar airspace disease and volume loss suggestive of CHF over PNA and small pleural effusions and interstitial pulmonary edema.  Echo done 4/19: LVEF 40-45%, akinesis of midinferoseptal, basal-midinferior, and basalanteroseptal myocardium.  Consistent with pseudonormal LV filling pattern with abnormal relaxation and increased filling pressure--grade 2 diastolic dysfunction.  S/p Cath 08/13/12: non-obstructive CAD and non-ischemic cardiomyopathy.  Presented with worsening SOB x2 days and fatigue. Current smoker 1/2ppd and hx of cocaine abuse.  Multiple risk factors including age, smoking history, HTN, and DM2 and mild chest discomfort yesterday.  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  He also likely had ongoing COPD exacerbation secondary to viral URI and continued smoking that is much improved since admission on steroids and antibiotics. Repeat CXR on 08/12/12: improving edema pattern, continued small to moderate b/l effusions with bibasilar opacities.  UDS was positive on admission for cocaine despite initial denial by patient of recent drug use. Troponin x3 negative.  Risk Stratification: TSH 0.835, HbA1C 8.5, and Lipid panel: LDL 81, Chol  183, TG 72, HDL 43.     -tele monitoring -Appreciate Cardiology following--cath yesteday, per Dr. Clifton James: needs aggressive risk factor reduction with complete cessation of substance abuse.  Continue statin and cardiomyopathy medical treatment. No BB with cocaine use, continue PO lasix and ACEi, ok for d/c today and follow up with Dr. Shirlee Latch.   -ASA  -BB--hold Lopressor in setting of cocaine abuse; consider coreg in future?.  Was on Metoprolol at home.  -ACEi--continue Lisinopril, increased to 10mg  qd -d/c norvasc -IV lasix 40mg  BID--transition to PO -oxygen therapy prn, keep o2  -morphine prn pain  -strict I/Os: net - since admission, but has been urinating in toilet so values may not be accurate -daily weights: 175lb on admission--weight stable at 172lb despite diuresis -pulse ox on ambulation -albuterol and atrovent nebz q6h prn  -prednisone 40mg  qd po x5 days--day 4 of steroids  -continue doxycycline 100mg   po bid x5 days; today is day 4  -likely will need PFTs as outpatient  -tylenol prn fever   Diabetes mellitus, type II--on home regimen: Lantus 30 units QHS and Metformin 500mg  BID. HbA1C 8.5 on admission.  -hold metformin  -continue home dose Lantus  -SSI moderate -CBG monitoring   Hypertension--on home regimen: Lopressor 25mg  BID and Lisinopril 5mg  qd. BP on admission 169/92  -hold BB in setting of cocaine use, consider Coreg on hospital follow up.  HR 56 on day of discharge.  -dis continue Norvasc 5mg  qd -continue Lasix 40mg  QD, and Lisinopril 10mg  qd -continue to monitor   Hypokalemia--K 3.2 on admission. Possibly secondary to dehydration in setting of decreased PO intake and loose BMs prior to admission and diuresis during admission. -replaced -continue to monitor   History of substance abuse--hx of cocaine abuse, claims last use was 7 months prior but UDS on admission positive for cocaine. Current everyday smoker 1/2ppd x30 years and last alcohol use claims was 3  months prior.  -smoking, alcohol, and cocaine cessation strongly advised  -continue wellbutrin   Depression--home medications include: Zoloft 100mg  qhs and Trazodone 50mg  qhs and Wellbutrin 100g qhs.  Appears stable at this time.  Denies suicidal or homicidal ideation. -continue home medications  Diet: Heart Healthy,  DVT Ppx: Lovenox  Dispo: likely d/c home today  The patient does have a current PCP (Dr. Morene Crocker, [facility]), therefore will not be requiring OPC follow-up after discharge; but we will set up one time [facility] follow up as well along with Dr. Alford Highland office follow up. He requests to d/c home today after 6pm when he will have transportation and someone at home with him.  The patient does not have transportation limitations that hinder transportation to clinic appointments.  Services Needed at time of discharge: Y = Yes, Blank = No PT:   OT:   RN:   Equipment:   Other:     LOS: 3 days   Darden Palmer 08/14/2012, 7:20 AM

## 2012-08-14 NOTE — Progress Notes (Signed)
I have reviewed and agree with Medical student Jiles Crocker. Please see my separate note .   Signed: Darden Palmer, MD PGY-I, Internal Medicine Resident Pager: [phone]  08/14/2012,5:21 PM

## 2012-08-14 NOTE — Progress Notes (Signed)
Utilization Review Completed.   Matison Nuccio, RN, BSN Nurse Case Manager  [phone]  

## 2012-08-14 NOTE — Progress Notes (Signed)
DC IV, DC Tele, DC Home. Discharge instructions and home medications discussed with patient and patient's family members. Patient and family denied any questions or concerns at that time. Patient leaving unit ambulatory and appears in no acute distress.

## 2012-08-14 NOTE — Progress Notes (Signed)
    SUBJECTIVE: Feels better. No chest pain or SOB.   BP 142/90  Pulse 58  Temp(Src) 97.4 F (36.3 C) (Oral)  Resp 20  Ht 6' 2.5" (1.892 m)  Wt 172 lb (78.019 kg)  BMI 21.8 kg/m2  SpO2 94%  Intake/Output Summary (Last 24 hours) at 08/14/12 0758 Last data filed at 08/14/12 0755  Gross per 24 hour  Intake   1460 ml  Output   1620 ml  Net   -160 ml    PHYSICAL EXAM General: Well developed, well nourished, in no acute distress. Alert and oriented x 3.  Psych:  Good affect, responds appropriately Neck: No JVD. No masses noted.  Lungs: Clear bilaterally with no wheezes or rhonci noted.  Heart: RRR with no murmurs noted. Abdomen: Bowel sounds are present. Soft, non-tender.  Extremities: No lower extremity edema.   LABS: Basic Metabolic Panel:  Recent Labs  16/10/96 1049 08/14/12 0510  NA 138 142  K 3.7 3.3*  CL 104 104  CO2 22 27  GLUCOSE 178* 87  BUN 13 13  CREATININE 0.68 0.80  CALCIUM 8.6 8.7   CBC:  Recent Labs  08/12/12 0600  WBC 8.7  HGB 14.7  HCT 41.3  MCV 81.8  PLT 241   Cardiac Enzymes:  Recent Labs  08/11/12 1241 08/11/12 1759  TROPONINI <0.30 <0.30   Fasting Lipid Panel:  Recent Labs  08/12/12 0600  CHOL 138  HDL 43  LDLCALC 81  TRIG 72  CHOLHDL 3.2    Current Meds: . albuterol  2.5 mg Nebulization BID  . amLODipine  5 mg Oral Daily  . aspirin EC  81 mg Oral Daily  . buPROPion  100 mg Oral QHS  . doxycycline  100 mg Oral Q12H  . furosemide  40 mg Oral BID  . insulin aspart  0-15 Units Subcutaneous TID WC  . insulin glargine  30 Units Subcutaneous QHS  . ipratropium  0.5 mg Nebulization BID  . lisinopril  5 mg Oral Daily  . potassium chloride  40 mEq Oral Once  . predniSONE  40 mg Oral Q breakfast  . sertraline  100 mg Oral QHS  . sodium chloride  3 mL Intravenous Q12H  . traZODone  50 mg Oral QHS     ASSESSMENT AND PLAN:  1. Acute systolic CHF: Pt with mild LV systolic dysfunction. Moderate non-obstructive CAD. His  cardiomyopathy is likely non-ischemic. Given history of heavy alcohol and cocaine use, I would suspect this has played a role. Continue medical management. He is on an Ace-inh. NO beta blocker with recent cocaine use. Volume status appears ok. Continue po Lasix.   2. Non-ischemic CM: See above. Medical management. Cessation etoh/cocaine.   3. CAD: Medical management  OK to d/c home from our perspective. Can f/u with Dr. Marca Ancona in our [address] office.   MCALHANY,CHRISTOPHER  4/22/20147:58 AM

## 2012-08-14 NOTE — Plan of Care (Signed)
Problem: Food- and Nutrition-Related Knowledge Deficit (NB-1.1) Goal: Nutrition education Formal process to instruct or train a patient/client in a skill or to impart knowledge to help patients/clients voluntarily manage or modify food choices and eating behavior to maintain or improve health. Outcome: Completed/Met Date Met:  08/14/12  RD consulted for nutrition education regarding diabetes.     Lab Results  Component Value Date    HGBA1C 8.5* 08/11/2012    RD provided "Carbohydrate Counting for People with Diabetes" handout from the Academy of [employer] Dietetics. Discussed different food groups and their effects on blood sugar, emphasizing carbohydrate-containing foods. Provided list of carbohydrates and recommended serving sizes of common foods.  Discussed importance of controlled and consistent carbohydrate intake throughout the day. Provided examples of ways to balance meals/snacks and encouraged intake of high-fiber, whole grain complex carbohydrates. Teach back method used.  Expect fair compliance.  Body mass index is 21.8 kg/(m^2). Pt meets criteria for WNL based on current BMI.  Current diet order is Carb Mod Medium/Low Sodium, patient is consuming approximately 100% of meals at this time. Labs and medications reviewed. No further nutrition interventions warranted at this time. RD contact information provided. If additional nutrition issues arise, please re-consult RD.  Clarene Duke RD, LDN Pager [phone] After Hours pager [phone]

## 2012-08-14 NOTE — Progress Notes (Signed)
Internal Medicine Teaching Service Attending Note Date: 08/14/2012  Patient name: [name]  Medical record number: [id number]  Date of birth: 04/15/1956    This patient has been seen and discussed with the house staff. Please see their note for complete details. I concur with their findings with the following additions/corrections: [name]'s cath showed non-obstructive CAD. He is without CP and his breathing is almost back to baseline. He has a scale at home and has been instructed in proper weight technique. We discussed D/C meds and the importance of taking them. We wants to F/U at the [place]. Stable for D/C today. No BB on D/C as HR was low today. PCP at [facility] can discuss addition of this med at F/U and in light of + cocaine. Of note, cards here rec no BB.  [name] 08/14/2012, 11:58 AM

## 2012-08-14 NOTE — Progress Notes (Signed)
Medical Student Daily Progress Note   Subjective:    [name] had no acute events overnight. Status post right and left heart cath. He states that he is sore and tired from his procedure yesterday but overall doing well. Soreness primarily in right groin area and along sternal border that continues to improve. Reports breathing is back at baseline. Patient was able to tolerate meal without nausea/vomiting.    Objective:    Vital Signs:   Temp:  [97 F (36.1 C)-98.1 F (36.7 C)] 97.4 F (36.3 C) (04/22 0448) Pulse Rate:  [58-99] 58 (04/22 0448) Resp:  [20] 20 (04/22 0448) BP: (131-157)/(81-95) 142/90 mmHg (04/22 0448) SpO2:  [93 %-97 %] 94 % (04/22 0448) Weight:  [78.019 kg (172 lb)] 78.019 kg (172 lb) (04/22 0448) Last BM Date: 08/10/12  Weights: 24-hour Weight change: -0.272 kg (-9.6 oz)  Filed Weights   08/12/12 0532 08/13/12 0550 08/14/12 0448  Weight: 78.291 kg (172 lb 9.6 oz) 78.291 kg (172 lb 9.6 oz) 78.019 kg (172 lb)   Net since admission:  -1.36 kg   Intake/Output:   Intake/Output Summary (Last 24 hours) at 08/14/12 0926 Last data filed at 08/14/12 0755  Gross per 24 hour  Intake   1460 ml  Output   1500 ml  Net    -40 ml     Net since admission:  -367 mL; urine output more than recorded.    Physical Exam: General:  In no acute distress, resting comfortably in bed  Eyes: PERRL, EOMI, sclera anicteric  ENT: Moist nasal and oral mucosa  Neck: Supple, without lymphadenopathy, no JVD  Chest: Without tenderness to palpation  CV: Regular rate and rhythm, pulse 88 on tele, normal S1 and S2, no murmurs, rubs or gallops appreciated Lungs: Normal work of breathing, clear to auscultation bilaterally, no wheezes appreciated.  Abdomen: + bowel sounds, soft, non-distended. Tenderness to palpation in right lower quadrant >left lower quadrant, no masses palpated  Extremities: Right cath access site covered in dry dressing, without erythema, tender to palpation.  Distal pulses intact bilaterally, without lower extremity edema. Neuro: alert and oriented x 3, cranial nerves II-XII grossly intact, strength 5/5 in bilateral upper, limited in right lower extremity given procedure.  Skin: warm, dry, without rash.     Labs: Basic Metabolic Panel:  Recent Labs Lab 08/11/12 0725 08/12/12 0600 08/13/12 1049 08/14/12 0510  NA 141 137 138 142  K 3.2* 3.4* 3.7 3.3*  CL 106 104 104 104  CO2 24 21 22 27   GLUCOSE 119* 169* 178* 87  BUN 10 11 13 13   CREATININE 0.83 0.77 0.68 0.80  CALCIUM 8.7 8.6 8.6 8.7    Liver Function Tests:  Recent Labs Lab 08/12/12 0600  AST 27  ALT 24  ALKPHOS 72  BILITOT 0.5  PROT 6.3  ALBUMIN 2.5*   CBC:  Recent Labs Lab 08/11/12 0725 08/12/12 0600  WBC 6.9 8.7  NEUTROABS 3.6  --   HGB 16.1 14.7  HCT 44.9 41.3  MCV 81.8 81.8  PLT 247 241    Cardiac Enzymes:  Recent Labs Lab 08/11/12 1241 08/11/12 1759  TROPONINI <0.30 <0.30    BNP (last 3 results):  Recent Labs  08/11/12 0759  PROBNP 6134.0*    CBG:  Recent Labs Lab 08/13/12 0547 08/13/12 1115 08/13/12 1539 08/13/12 2108 08/14/12 0547  GLUCAP 112* 161* 161* 244* 77    Coagulation Studies:  Recent Labs  08/13/12 0655  LABPROT 13.7  INR 1.06  Medications:    Infusions:     Scheduled Medications: . aspirin EC  81 mg Oral Daily  . buPROPion  100 mg Oral QHS  . doxycycline  100 mg Oral Q12H  . furosemide  40 mg Oral Daily  . insulin aspart  0-15 Units Subcutaneous TID WC  . insulin glargine  30 Units Subcutaneous QHS  . lisinopril  10 mg Oral Daily  . potassium chloride  40 mEq Oral Once  . predniSONE  40 mg Oral Q breakfast  . sertraline  100 mg Oral QHS  . sodium chloride  3 mL Intravenous Q12H  . traZODone  50 mg Oral QHS     PRN Medications: acetaminophen, acetaminophen, albuterol, hydrALAZINE, morphine injection, ondansetron (ZOFRAN) IV    Assessment/ Plan:   Mr. Byington is a 57 yo gentleman with  a past medical history significant for HTN, CAD, MI and DM who presents with a 2 day history of productive cough w/ yellow phlegm, shortness of breath and dyspnea on exertion. Echo with EF of 40-45%, grade 2 diastolic dysfunction.   # Shortness of breath, productive cough, resolving: Patient presented dyspneic with saturations to 87% upon arrival to the ED. Has been able to maintain good oxygenation on 2L O2 by nasal cannula, now without need for supplemental O2. Echo results with EF 40-45%, akinesis of basilar areas and grade 2 diastolic dysfunction. Cath with 50% stenosis to LAD, 30% proximal, 40% distal circumflex, 30% RCA.  Patient has continued with diuresis now with weight stabilizing. Patient improving overall. Will finish course of treatment for COPD exacerbation and patient will have follow-up with cardiology in the outpatient for management of CHF. Plan for discharge this evening or tomorrow morning.  -Changing diuresis to Lasix 40 po qd to be continued upon discharge -discontinuing scheduled albuterol and ipratropium treatments   -finishing doxycycline 100 mg q12s regimen, day 4/5 -prednisone 40 mg po qd, day 4/5  -Will ambulate patient with saturation monitoring -daily weights, to be reviewed with patient and continued in outpatient setting  -Increasing lisinopril dose to 10 mg qd, discontinued CCB  -Not restarting beta blocker given recent history of cocaine use per cardiology and episode of bradycardia to 58.  -continuing aspirin qd, statin qd  -Will follow-up with cardiology in the outpatient setting. Will need adjustment of BP and CHF regimen.    # Hypokalemia: Patient hypokalemic on admission at 3.2. Potassium has continued to be low with continued diuresis, at 3.3 this AM. Will continue to replete.  -Will start potassium chloride 10 meq po qd for repletion x 2 weeks upon discharge.   # HTN: Managed with lisinopril and metoprolol. Patient remained hypertensive to 140-150s/80-90s  during the course of hospitalization.  -New regimen of Lasix 40 mg po qd and lisinopril 10 mg qd -Holding beta blocker given recent cocaine use and episode of bradycardia to 58.  # DM: Patient with history of DM, hospitalized Jan 2014 with hyperglycemia. CBGs continue to be elevated overnight into the 300s, steroid administration likely contributing. Blood sugars within normal range this AM -HgA1c 8.5  -restarting home metformin   -continuing home lantus 30 mg qhs   #History of substance abuse: Patient with history of alcohol and cocaine abuse. Denies recent use; alcohol in last 2-3 months, cocaine in last 7 months. UDS today positive for cocaine, alcohol negative. Concern use may be contributing to previous and current heart disease.  -holding beta blocker given recent cocaine use -counseled patient on the importance of stopping cocaine,  alcohol and cigarette use.   #Depression: Patient with history of depression. Current regimen of bupropion, sertraline and trazodone.  - continuing home medications upon discharge   # FEN/GI  -heart healthy and carb modified diet  -Is & Os   # PPx  -lovenox 40 mg qd   Code Status: Full code   Dispo: Discharge this evening or tomorrow morning with continued improvement in symptoms. [facility] Patient will likely need PFTs in outpatient setting.    Length of Stay: 3 days   This is a Psychologist, occupational Note.  The care of the patient was discussed with Dr. Virgina Organ and Dr. Rogelia Boga and the assessment and plan formulated with their assistance.  Please see their attached note or addendum for official documentation of the daily encounter.  Jiles Crocker, MS3 08/14/2012

## 2012-08-15 ENCOUNTER — Telehealth: Payer: Self-pay | Admitting: Dietician

## 2012-08-15 NOTE — Telephone Encounter (Signed)
was given patient's cell # [id number] which is not in service. Message left that patient has appointment 08/24/12 at 3 PM

## 2012-08-16 NOTE — Discharge Summary (Signed)
Internal Medicine Teaching [facility] Discharge Note  Name: [name] MRN: [id number] DOB: 02-22-1956 57 y.o.  Date of Admission: 08/11/2012  6:26 AM Date of Discharge: 08/16/2012 Attending Physician: Dr. Rogelia Boga Discharge Diagnosis: Principal Problem:   Systolic and diastolic CHF, acute Active Problems:   Diabetes mellitus, type II   Hypertension   History of substance abuse   Dyspnea   Non Obstructive CAD (coronary artery disease)   Cardiomyopathy, nonischemic  Discharge Medications:   Medication List    STOP taking these medications       metoprolol tartrate 25 MG tablet  Commonly known as:  LOPRESSOR      TAKE these medications       aspirin 81 MG EC tablet  Take 1 tablet (81 mg total) by mouth daily.     buPROPion 100 MG tablet  Commonly known as:  WELLBUTRIN  Take 100 mg by mouth at bedtime.     furosemide 40 MG tablet  Commonly known as:  LASIX  Take 1 tablet (40 mg total) by mouth daily.     ibuprofen 200 MG tablet  Commonly known as:  ADVIL,MOTRIN  Take 400-600 mg by mouth every 6 (six) hours as needed for pain.     insulin glargine 100 UNIT/ML injection  Commonly known as:  LANTUS  Inject 30 Units into the skin at bedtime.     lisinopril 10 MG tablet  Commonly known as:  PRINIVIL,ZESTRIL  Take 1 tablet (10 mg total) by mouth daily.     metFORMIN 1000 MG tablet  Commonly known as:  GLUCOPHAGE  Take 500 mg by mouth 2 (two) times daily with a meal.     potassium chloride 10 MEQ tablet  Commonly known as:  K-DUR  Take 1 tablet (10 mEq total) by mouth daily.     pravastatin 10 MG tablet  Commonly known as:  PRAVACHOL  Take 1 tablet (10 mg total) by mouth daily.     sertraline 100 MG tablet  Commonly known as:  ZOLOFT  Take 100 mg by mouth at bedtime.     traZODone 50 MG tablet  Commonly known as:  DESYREL  Take 50 mg by mouth at bedtime.       Disposition and follow-up:   Mr.[name] was discharged from [facility] in Stable condition.  At the hospital follow up visit please address: Acute systolic CHF with non-ischemic CM.  Presented with worsening SOB.  EF 4-455 on echo with akinesis of myocardium.  Moderate nonobstructive CAD.  Adherence to medical management.  Discharged on Ace inhibitor, statin, lasix, and asa.  No BB due to recent cocaine abuse.   Cocaine and tobacco abuse: cessation strongly advised!  COPD exacerbation:was to complete 5 day course of prednisone and doxycycline.  Needs PFTs.  HTN: discharged on Lisinopril 10mg  and Lasix 40mg .  No BB per cards in setting of recent cocaine use.  Recheck BP and adjust medications as needed.      DM2: d/c on Lantus 30 units.  HbA1C 8.5.  Follow up CBGs and adjust insulin dose as needed.    Follow-up Appointments:     Follow-up Information   Follow up with Marca Ancona, MD On 08/31/2012. (at 10:30 with Dr. Alford Highland PA)    Contact information:   [address] [place] [address] [phone]       Follow up with [facility] On 08/20/2012. (Bring Discharge documents and  list of new medications)    Contact information:   [address] [place], [place]. [address] Phone: [phone]        Follow up with Lyn Hollingshead, MD On 08/24/2012. (@3pm )    Contact information:   [address] [place] [address] [phone]      Discharge Orders   Future Appointments Provider Department Dept Phone   08/24/2012 3:00 PM Sunday Spillers, MD [facility] [phone]   08/31/2012 10:30 AM Beatrice Lecher, PA-C [facility] Main [facility] [place]) [phone]   Future Orders Complete By Expires     Call MD for:  difficulty breathing, headache or visual disturbances  As directed     Call MD for:  severe uncontrolled pain  As directed     Diet - low sodium heart healthy  As directed     Increase activity slowly  As directed       Consultations: [facility]  Procedures Performed:  Dg Chest 2 View  08/12/2012  *RADIOLOGY REPORT*  Clinical Data: Pleural effusions, CHF.  CHEST - 2 VIEW  Comparison: 08/11/2012  Findings: Mild cardiomegaly.  There are bilateral pleural effusions with bibasilar atelectasis or infiltrates.  Mild vascular congestion.  Improving airspace disease from prior study, likely improving edema.  IMPRESSION: Continued small to moderate bilateral effusions with bibasilar opacities, likely atelectasis.  Improving edema pattern.   Original Report Authenticated By: Charlett Nose, M.D.    Dg Chest 2 View  08/11/2012  *RADIOLOGY REPORT*  Clinical Data: Cough.  Chest pain.  Short of breath.  CHEST - 2 VIEW  Comparison: 11/23/2008.  Findings: The cardiopericardial silhouette appears within normal limits for size allowing for bilateral basilar airspace disease. The basilar airspace disease is asymmetric slightly greater on the right than left.  There is some volume loss at the bases as well. Fine interstitial prominence is present in the perihilar regions. Small bilateral pleural effusions.  Thickening of the fissures is present, compatible with interstitial pulmonary edema.  On the lateral view, there is consolidation of both lower lobes.  Follow-up to ensure radiographic clearing recommended.  Clearing is usually observed at 8 weeks.  IMPRESSION: 1.  Right greater than left basilar airspace disease and volume loss.  The presence on both sides suggest CHF over pneumonia however multifocal infection is possible.  Aspiration considered less likely. 2.  Small pleural effusions and interstitial pulmonary edema, again suggesting CHF over multi focal infection.   Original Report Authenticated By: Andreas Newport, M.D.    2D Echo: Transthoracic Echocardiography  Patient: Thomas, [name] MR #: [id number] Study Date: 08/11/2012 Gender: M Age: 67 Height: 189.2cm Weight: 78.4kg BSA: 2.19m^2 Pt. Status: Room: 4738  PERFORMING [place],  [facility] [place], Doris Cheadle REFERRING Nelia Shi SONOGRAPHER Silvano Bilis, RCS ADMITTING [place], Ankit ATTENDING Eben Burow, Ankit cc:  ------------------------------------------------------------ LV EF: 40% - 45%  ------------------------------------------------------------ Indications: Dyspnea 786.09.  ------------------------------------------------------------ History: PMH: Substance abuse. Risk factors: Current tobacco use. Hypertension. Diabetes mellitus.  ------------------------------------------------------------ Study Conclusions  - Left ventricle: The cavity size was normal. Wall thickness was normal. Systolic function was mildly to moderately reduced. The estimated ejection fraction was in the range of 40% to 45%. There is akinesis of the midinferoseptal myocardium. There is akinesis of the basal-midinferior myocardium. There is akinesis of the basalanteroseptal myocardium. Features are consistent with a pseudonormal left ventricular filling pattern, with concomitant abnormal relaxation and increased filling pressure (grade 2 diastolic dysfunction). - Aortic valve: Mildly calcified annulus. Trileaflet. -  Mitral valve: Calcified annulus. Mild regurgitation. - Left atrium: The atrium was mildly dilated. - Right atrium: Central venous pressure: 5mm Hg (est). - Tricuspid valve: Trivial regurgitation. - Pulmonary arteries: Systolic pressure could not be accurately estimated. - Pericardium, extracardiac: There was no pericardial effusion. Transthoracic echocardiography. M-mode, complete 2D, spectral Doppler, and color Doppler. Height: Height: 189.2cm. Height: 74.5in. Weight: Weight: 78.4kg. Weight: 172.5lb. Body mass index: BMI: 21.9kg/m^2. Body surface area: BSA: 2.6m^2. Blood pressure: 146/83. Patient status: Inpatient. Location:  Bedside.  ------------------------------------------------------------  ------------------------------------------------------------ Left ventricle: The cavity size was normal. Wall thickness was normal. Systolic function was mildly to moderately reduced. The estimated ejection fraction was in the range of 40% to 45%. Regional wall motion abnormalities: There is akinesis of the midinferoseptal myocardium. There is akinesis of the basal-midinferior myocardium. There is akinesis of the basalanteroseptal myocardium. Features are consistent with a pseudonormal left ventricular filling pattern, with concomitant abnormal relaxation and increased filling pressure (grade 2 diastolic dysfunction).  ------------------------------------------------------------ Aortic valve: Mildly calcified annulus. Trileaflet. Cusp separation was normal. Doppler: No significant regurgitation.  ------------------------------------------------------------ Aorta: Aortic root: The aortic root was normal in size.  ------------------------------------------------------------ Mitral valve: Calcified annulus. Doppler: Mild regurgitation. Peak gradient: 2mm Hg (D).  ------------------------------------------------------------ Left atrium: The atrium was mildly dilated.  ------------------------------------------------------------ Right ventricle: The cavity size was normal. Systolic function was normal.  ------------------------------------------------------------ Pulmonic valve: Poorly visualized. Doppler: Trivial regurgitation.  ------------------------------------------------------------ Tricuspid valve: The valve appears to be grossly normal. Doppler: Trivial regurgitation.  ------------------------------------------------------------ Pulmonary artery: Systolic pressure could not be accurately estimated.  ------------------------------------------------------------ Right atrium: The atrium was normal  in size.  ------------------------------------------------------------ Pericardium: There was no pericardial effusion.  ------------------------------------------------------------ Systemic veins: Inferior vena cava: The vessel was normal in size; the respirophasic diameter changes were in the normal range (= 50%); findings are consistent with normal central venous pressure.  ------------------------------------------------------------  2D measurements Normal Doppler Normal Left ventricle measurements LVID ED, 52.5 mm 43-52 Left ventricle chord, Ea, lat 7.46 cm/ ------- PLAX ann, tiss s LVID ES, 40.9 mm 23-38 DP chord, E/Ea, lat 9.85 ------- PLAX ann, tiss FS, 22 % >29 DP chord, Ea, med 6.36 cm/ ------- PLAX ann, tiss s LVPW, ED 10.9 mm ------ DP IVS/LVPW 0.72 <1.3 E/Ea, med 11.56 ------- ratio, ED ann, tiss Vol ED, 113.9 ml ------ DP MOD1 Mitral valve Vol ES, 68.7 ml ------ Peak E vel 73.5 cm/ ------- MOD1 s EF, MOD1 39.63 % ------ Peak A vel 67.6 cm/ ------- Stroke 45.1 ml ------ s vol, MOD1 Deceleratio 151 ms 150-230 Vol 56 ml/m^2 ------ n time index, Peak 2 mm ------- ED, MOD1 gradient, D Hg Vol 34 ml/m^2 ------ Peak E/A 1.1 ------- index, ratio ES, MOD1 Systemic veins Stroke 22.3 ml/m^2 ------ Estimated 5 mm ------- index, CVP Hg MOD1 Right ventricle Ventricular septum Sa vel, lat 15.4 cm/ ------- IVS, ED 7.82 mm ------ ann, tiss s Aorta DP Root 33 mm ------ diam, ED Left atrium AP dim 32 mm ------ AP dim 1.58 cm/m^2 <2.2 index  ------------------------------------------------------------ Prepared and Electronically Authenticated by  Nona Dell 2014-04-20T10:50:42.950  Cardiac Cath:  Cardiac Catheterization Operative Report  CARNELL BEAVERS  [id number]  4/21/20143:30 PM  Provider Not In System  Procedure Performed:  1. Left Heart Catheterization 2. Selective Coronary Angiography 3. Right Heart Catheterization 4. Left ventricular  pressures Operator: Verne Carrow, MD  Indication: 57 yo male with history of DM, HTN, cocaine abuse, etoh abuse, tobacco abuse admitted with dyspnea and found to have segmental LV systolic dysfunction. No  chest pain or elevation of cardiac markers.  Procedure Details:  The risks, benefits, complications, treatment options, and expected outcomes were discussed with the patient. The patient and/or family concurred with the proposed plan, giving informed consent. The patient was brought to the cath lab after IV hydration was begun and oral premedication was given. The patient was further sedated with Versed and Fentanyl. The right groin was prepped and draped in the usual manner. Using the modified Seldinger access technique, a 5 French sheath was placed in the femoral artery. A 7 French sheath was inserted into the right femoral vein. A balloon tipped catheter was used to perform a right heart catheterization. A JL4 catheter was used to selectively engage the left main artery. Left coronary angiography was performed. I was unable to selectively engage the right coronary artery due to an anterior takeoff. I used a JR4, NoTorque Right, AR1, AR2, AL1, 3DRC, multi-purpose. I was able to visualize the vessel with non-selective angiography. A pigtail catheter was used to measure LV pressures.There were no immediate complications. The patient was taken to the recovery area in stable condition.  Hemodynamic Findings:  Ao: 142/91  LV: 147/12/18  RA: 6  RV: 44/2/7  PA: 41/19 (mean 28)  PCWP: 13  Fick Cardiac Output: 5.96 L/min  Fick Cardiac Index: 2.89 L/min/m2  Central Aortic Saturation: 93%  Pulmonary Artery Saturation: 70%  Angiographic Findings:  Left main: No obstructive disease noted.  Left Anterior Descending Artery: Large caliber vessel that courses to the apex. The proximal vessel has a long segment of 50% stenosis but this does not appear to be flow limiting.  Circumflex Artery: Large  caliber vessel with 30% ostial stenosis. The first obtuse marginal branch is large in caliber and has no focally obstructive disease. There is a small to moderate caliber ramus intermediate branch with 40% ostial stenosis.  Right Coronary Artery: Large dominant vessel with 30% proximal stenosis. The distal vessel, PDA and posterolateral branches are patent with no obstructive disease noted.  Left Ventricular Angiogram: Deferred.  Impression:  1. Moderate non-obstructive CAD  2. Non-ischemic cardiomyopathy  Recommendations: He will need aggressive risk factor reduction with complete cessation of tobacco, alcohol and cocaine. Continue statin and medical therapy for his cardiomyopathy. Should be ok to discharge in am. He can f/u in our office with Dr. Shirlee Latch in several weeks.  Complications: None; patient tolerated the procedure well.   Admission HPI: Mr. Eguia is a 57 year old African American male with PMH of DM2, HTN, CAD, and s/p MI at age of 51 presenting to the ED today with 2 days of worsening shortness of breath and productive cough with yellow sputum. He explains that normally he can walk at least half a mile without any complaints, however, recently even 30 feet gets him exhausted and short of breath. He complains of increased generalized weakness, sneezing, intermittent chills, headaches x2 days. He had mild abdominal pain and two episodes of loose BMs yesterday that have since then resolved. He also endorses decreased appetite and approximately 40 pound weight loss over the past year without trying. Mr. Goodrich denies any leg swelling, sleeps with two pillows but has been unable to sleep due to shortness of breath and cough for the past two nights. He cannot lay flat due to worsening of his breathing, has coughing when lying on his sides, but improvement of symptoms when sitting up. He denies any nausea or vomiting, describes mild substernal chest discomfort while coughing yesterday, and  denies any  abdominal pain, or urinary complaints at this time.  He is followed at the [facility] clinic. He smokes 1/2PPD x 30 years, has not drank alcohol in the past 3 months, and has a history of cocaine abuse (last use 7 months ago). He apparently had a cath at the age of 35 and reports having some blockage cleaned out.  In the ED, noted to desat to 87% on room air, placed on 2L [place] O2 improving to 93%, given IV solumedrol x1 and IV lasix 40mg  X1, and breathing treatment.   Hospital Course by problem list:    Systolic and diastolic CHF, acute--presented with worsening dyspnea x2 days and fatigue.  He is a current everyday smoker ~1/2ppd and hx of cocaine abuse. Multiple risk factors including age, smoking history, HTN, and DM2 and mild chest discomfort yesterday. 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 UDS was positive on admission for cocaine despite initial denial by patient of recent drug use. Troponin x3 negative. Risk Stratification: TSH 0.835, HbA1C 8.5, and Lipid panel: LDL 81, Chol 183, TG 72, HDL 43. proBNP of 6134 on admission. CXR on admission showed R>L basilar airspace disease and volume loss suggestive of CHF over PNA and small pleural effusions and interstitial pulmonary edema. Cardiology was consulted, Echo was done 4/19: LVEF 40-45%, akinesis of midinferoseptal, basal-midinferior, and basalanteroseptal myocardium. Consistent with pseudonormal LV filling pattern with abnormal relaxation and increased filling pressure--grade 2 diastolic dysfunction.  Cath was then done on 08/13/12 showing non-obstructive CAD and non-ischemic cardiomyopathy and medical management was recommended with complete cessation of substance abuse and follow up as outpatient with Dr. Shirlee Latch and with PCP.  He wad discharged on Asprin, PO lasix, Ace inhibitor, and no BB due to recent cocaine abuse per cardiology recommendations.      Diabetes mellitus, type II--on home regimen: Lantus 30 units QHS  and Metformin 500mg  BID. HbA1C 8.5 on admission. Metformin was held during hospitalization and he was continued on Lantus along with SSI.  Discharged back on home dose and will need to be monitored by PCP and adjusted as needed.      Hypertension--on home regimen: Lopressor 25mg  BID and Lisinopril 5mg  qd. BP on admission 169/92.  Initially Lopressor was held and lisinopril and lasix was continued along with Norvasc.  Eventually norvasc was discontinued and Lisinopril dose was increased to 10mg  daily and continuation of lasix.  BB was held in setting of recent cocaine use per cardiology.  Follow up with PCP and cardiology advised.       History of substance abuse--chronic tobacco and cocaine abuse.  UDS on admission positive for cocaine even though initially denied.  Smoked 1/2ppd prior to admission.  Counseled extensively on cessation and he is receptive to it.  Hopefully will be able too.  Follow up with PCP.      Dyspnea--likely secondary to acute systolic and diastolic CHF and ongoing COPD exacerbation secondary to viral URI as well.  He also continued smoking prior to admission and using cocaine.  Dyspnea improved since admission with 5 day course of prednisone and doxycyline, albuterol and atrovent nebulizer treatments, along with lasix, aspirin, and ACE inhibitor. CXR on admission was suggestive of CHF.  Repeat CXR on 08/12/12 showed improving edema pattern, continued small to moderate b/l effusions with bibasilar opacities. Weight on discharge was 172lbs down from 175lbs on admission.  Discharged on completing course of doxycyline and prednisone along with lasix, aspirin, statin, ace inhibitor.  He will  need PFTs as an outpatient, smoking cessation along with cocaine cessation and close follow up with pcp, cardiology, and maybe pulmonologist as well.       Depression--home medications include: Zoloft 100mg  qhs and Trazodone 50mg  qhs and Wellbutrin 100g qhs. Appeared stable during hospitalization.  Denied suicidal or homicidal ideation. Continued on home medications on admission and on discharge.  Follow up with pcp or psychiatry.      Hypokalemia--K 3.2 on admission. Possibly secondary to dehydration in setting of decreased PO intake and loose BMs prior to admission and diuresis during admission along with lasix. Replaced during hospital course.  Will need follow up with PCP and bmet rechecked.    Discharge Vitals:  BP 151/72  Pulse 80  Temp(Src) 98 F (36.7 C) (Oral)  Resp 20  Ht 6' 2.5" (1.892 m)  Wt 172 lb (78.019 kg)  BMI 21.8 kg/m2  SpO2 96%  Discharge Labs:  Basic Metabolic Panel:  Recent Labs Lab 08/13/12 1049 08/14/12 0510  NA 138 142  K 3.7 3.3*  CL 104 104  CO2 22 27  GLUCOSE 178* 87  BUN 13 13  CREATININE 0.68 0.80  CALCIUM 8.6 8.7   Liver Function Tests:  Recent Labs Lab 08/12/12 0600  AST 27  ALT 24  ALKPHOS 72  BILITOT 0.5  PROT 6.3  ALBUMIN 2.5*   CBC:  Recent Labs Lab 08/11/12 0725 08/12/12 0600  WBC 6.9 8.7  NEUTROABS 3.6  --   HGB 16.1 14.7  HCT 44.9 41.3  MCV 81.8 81.8  PLT 247 241   Cardiac Enzymes:  Recent Labs Lab 08/11/12 1241 08/11/12 1759  TROPONINI <0.30 <0.30   BNP:  Recent Labs Lab 08/11/12 0759  PROBNP 6134.0*   CBG:  Recent Labs Lab 08/13/12 1115 08/13/12 1539 08/13/12 2108 08/14/12 0547 08/14/12 1126 08/14/12 1628  GLUCAP 161* 161* 244* 77 193* 266*   Hemoglobin A1C:  Recent Labs Lab 08/11/12 1243  HGBA1C 8.5*   Fasting Lipid Panel:  Recent Labs Lab 08/12/12 0600  CHOL 138  HDL 43  LDLCALC 81  TRIG 72  CHOLHDL 3.2   Thyroid Function Tests:  Recent Labs Lab 08/11/12 1243  TSH 0.835   Coagulation:  Recent Labs Lab 08/13/12 0655  LABPROT 13.7  INR 1.06   Urine Drug Screen: Drugs of Abuse     Component Value Date/Time   LABOPIA NONE DETECTED 08/11/2012 1118   COCAINSCRNUR POSITIVE* 08/11/2012 1118   LABBENZ NONE DETECTED 08/11/2012 1118   AMPHETMU NONE DETECTED  08/11/2012 1118   THCU NONE DETECTED 08/11/2012 1118   LABBARB NONE DETECTED 08/11/2012 1118    Alcohol Level:  Recent Labs Lab 08/11/12 1110  ETH <11   Signed: Darden Palmer 08/16/2012, 5:18 PM   Time Spent on Discharge: 40 minutes Services Ordered on Discharge: none, [facility], [facility] appointment and with cardiology Dr. Alford Highland office.   Equipment Ordered on Discharge: none

## 2012-08-24 ENCOUNTER — Ambulatory Visit (INDEPENDENT_AMBULATORY_CARE_PROVIDER_SITE_OTHER): Payer: Medicare Other | Admitting: Internal Medicine

## 2012-08-24 ENCOUNTER — Encounter: Payer: Self-pay | Admitting: Internal Medicine

## 2012-08-24 VITALS — BP 123/75 | HR 70 | Temp 97.8°F | Ht 74.5 in | Wt 174.3 lb

## 2012-08-24 DIAGNOSIS — F1911 Other psychoactive substance abuse, in remission: Secondary | ICD-10-CM

## 2012-08-24 DIAGNOSIS — I10 Essential (primary) hypertension: Secondary | ICD-10-CM

## 2012-08-24 DIAGNOSIS — I509 Heart failure, unspecified: Secondary | ICD-10-CM

## 2012-08-24 DIAGNOSIS — F1910 Other psychoactive substance abuse, uncomplicated: Secondary | ICD-10-CM

## 2012-08-24 DIAGNOSIS — I5041 Acute combined systolic (congestive) and diastolic (congestive) heart failure: Secondary | ICD-10-CM

## 2012-08-24 NOTE — Assessment & Plan Note (Signed)
Reiterated need for substance cessation.

## 2012-08-24 NOTE — Assessment & Plan Note (Signed)
BP Readings from Last 3 Encounters:  08/24/12 123/75  08/14/12 151/72  08/14/12 151/72    Lab Results  Component Value Date   NA 142 08/14/2012   K 3.3* 08/14/2012   CREATININE 0.80 08/14/2012    Assessment: Blood pressure control:   at goal Progress toward BP goal:    better Comments: No new changes  Plan: Medications:  continue current medications, Continue lisinopril, Lasix. Educational resources provided:   Self management tools provided:   Other plans: No changes in medications.

## 2012-08-24 NOTE — Assessment & Plan Note (Addendum)
Compensated CHF. Continue aspirin, Lasix, lisinopril and Pravachol. No beta blocker due to cocaine abuse.  Followup with cardiology on 08/31/2012.

## 2012-08-24 NOTE — Patient Instructions (Signed)
[facility] and [facility] as scheduled. Continue taking all medications as you do.

## 2012-08-24 NOTE — Progress Notes (Signed)
  Subjective:    Patient ID: [name], male    DOB: Aug 28, 1955, 57 y.o.   MRN: [id number]  HPI patient is a pleasant 57 year old man with DM 2, systolic and diastolic heart failure, history of systems abuse and hypertension comes to the clinic for hospital followup.   He Was admitted in hospital in April for heart failure exacerbation. He got extensive cardiac workup including 2-D echo and coronary angiogram.   [facility]   He denies any symptoms of short of breath, chest pain, palpitations, headache, dizziness since hospital discharge. Denies any nausea, vomiting or abdominal pain, diarrhea, weakness or numbness.  He is followup with cardiology on 08/31/2012.   he says that he has his breathing studies scheduled at [facility].  He needed disability parking form to be signed.   Review of Systems     as per history of present illness.  Objective:   Physical Exam  General: NAD HEENT: PERRL, EOMI, no scleral icterus Cardiac: S1, S2, RRR, no rubs, murmurs or gallops Pulm: clear to auscultation bilaterally, moving normal volumes of air Abd: soft, nontender, nondistended, BS present Ext: warm and well perfused, no pedal edema Neuro: alert and oriented X3, cranial nerves II-XII grossly intact       Assessment & Plan:

## 2012-08-30 NOTE — Progress Notes (Signed)
Case discussed with Dr. Patel immediately after the resident saw the patient.  We reviewed the resident's history and exam and pertinent patient test results.  I agree with the assessment, diagnosis and plan of care documented in the resident's note. 

## 2012-08-31 ENCOUNTER — Encounter: Payer: Medicare Other | Admitting: Physician Assistant

## 2012-12-17 ENCOUNTER — Encounter (HOSPITAL_COMMUNITY): Payer: Self-pay | Admitting: Emergency Medicine

## 2012-12-17 ENCOUNTER — Emergency Department (HOSPITAL_COMMUNITY)
Admission: EM | Admit: 2012-12-17 | Discharge: 2012-12-20 | Disposition: A | Discharge status: Other Acute Inpatient Hospital | Payer: Medicare Other | Source: Home / Self Care | Attending: Emergency Medicine | Admitting: Emergency Medicine

## 2012-12-17 DIAGNOSIS — E119 Type 2 diabetes mellitus without complications: Secondary | ICD-10-CM | POA: Diagnosis present

## 2012-12-17 DIAGNOSIS — R45851 Suicidal ideations: Secondary | ICD-10-CM | POA: Insufficient documentation

## 2012-12-17 DIAGNOSIS — Z794 Long term (current) use of insulin: Secondary | ICD-10-CM

## 2012-12-17 DIAGNOSIS — R4589 Other symptoms and signs involving emotional state: Secondary | ICD-10-CM

## 2012-12-17 DIAGNOSIS — Z7982 Long term (current) use of aspirin: Secondary | ICD-10-CM

## 2012-12-17 DIAGNOSIS — I10 Essential (primary) hypertension: Secondary | ICD-10-CM | POA: Insufficient documentation

## 2012-12-17 DIAGNOSIS — F10939 Alcohol use, unspecified with withdrawal, unspecified: Principal | ICD-10-CM | POA: Diagnosis present

## 2012-12-17 DIAGNOSIS — Z79899 Other long term (current) drug therapy: Secondary | ICD-10-CM | POA: Insufficient documentation

## 2012-12-17 DIAGNOSIS — I252 Old myocardial infarction: Secondary | ICD-10-CM | POA: Insufficient documentation

## 2012-12-17 DIAGNOSIS — F17200 Nicotine dependence, unspecified, uncomplicated: Secondary | ICD-10-CM | POA: Insufficient documentation

## 2012-12-17 DIAGNOSIS — F1010 Alcohol abuse, uncomplicated: Secondary | ICD-10-CM | POA: Insufficient documentation

## 2012-12-17 DIAGNOSIS — F1420 Cocaine dependence, uncomplicated: Secondary | ICD-10-CM | POA: Diagnosis present

## 2012-12-17 DIAGNOSIS — F4310 Post-traumatic stress disorder, unspecified: Secondary | ICD-10-CM | POA: Diagnosis present

## 2012-12-17 DIAGNOSIS — R739 Hyperglycemia, unspecified: Secondary | ICD-10-CM

## 2012-12-17 DIAGNOSIS — F10239 Alcohol dependence with withdrawal, unspecified: Principal | ICD-10-CM | POA: Diagnosis present

## 2012-12-17 DIAGNOSIS — I2510 Atherosclerotic heart disease of native coronary artery without angina pectoris: Secondary | ICD-10-CM | POA: Insufficient documentation

## 2012-12-17 DIAGNOSIS — F10229 Alcohol dependence with intoxication, unspecified: Secondary | ICD-10-CM | POA: Diagnosis present

## 2012-12-17 DIAGNOSIS — F1410 Cocaine abuse, uncomplicated: Secondary | ICD-10-CM | POA: Insufficient documentation

## 2012-12-17 DIAGNOSIS — F332 Major depressive disorder, recurrent severe without psychotic features: Secondary | ICD-10-CM | POA: Diagnosis present

## 2012-12-17 DIAGNOSIS — Z9861 Coronary angioplasty status: Secondary | ICD-10-CM | POA: Insufficient documentation

## 2012-12-17 DIAGNOSIS — R443 Hallucinations, unspecified: Secondary | ICD-10-CM | POA: Insufficient documentation

## 2012-12-17 LAB — CBC
MCV: 85.1 fL (ref 78.0–100.0)
Platelets: 263 10*3/uL (ref 150–400)
RBC: 5.22 MIL/uL (ref 4.22–5.81)
RDW: 12.9 % (ref 11.5–15.5)
WBC: 5 10*3/uL (ref 4.0–10.5)

## 2012-12-17 LAB — RAPID URINE DRUG SCREEN, HOSP PERFORMED
Amphetamines: NOT DETECTED
Benzodiazepines: NOT DETECTED
Tetrahydrocannabinol: NOT DETECTED

## 2012-12-17 LAB — COMPREHENSIVE METABOLIC PANEL
ALT: 70 U/L — ABNORMAL HIGH (ref 0–53)
AST: 119 U/L — ABNORMAL HIGH (ref 0–37)
Albumin: 3.4 g/dL — ABNORMAL LOW (ref 3.5–5.2)
Alkaline Phosphatase: 132 U/L — ABNORMAL HIGH (ref 39–117)
CO2: 24 mEq/L (ref 19–32)
Chloride: 99 mEq/L (ref 96–112)
Creatinine, Ser: 0.73 mg/dL (ref 0.50–1.35)
GFR calc non Af Amer: >90 mL/min (ref >90)
Potassium: 4.1 mEq/L (ref 3.5–5.1)
Sodium: 138 mEq/L (ref 135–145)
Total Bilirubin: 0.4 mg/dL (ref 0.3–1.2)

## 2012-12-17 LAB — GLUCOSE, CAPILLARY: Glucose-Capillary: 401 mg/dL — ABNORMAL HIGH (ref 70–99)

## 2012-12-17 LAB — ACETAMINOPHEN LEVEL: Acetaminophen (Tylenol), Serum: <15 ug/mL (ref 10–30)

## 2012-12-17 MED ORDER — FOLIC ACID 1 MG PO TABS
1.0000 mg | ORAL_TABLET | Freq: Every day | ORAL | Status: DC
  Administered 2012-12-18 – 2012-12-20 (×4): 1 mg via ORAL
  Filled 2012-12-17 (×4): qty 1

## 2012-12-17 MED ORDER — SODIUM CHLORIDE 0.9 % IV BOLUS (SEPSIS)
1000.0000 mL | Freq: Once | INTRAVENOUS | Status: AC
  Administered 2012-12-17: 1000 mL via INTRAVENOUS

## 2012-12-17 MED ORDER — VITAMIN B-1 100 MG PO TABS
100.0000 mg | ORAL_TABLET | Freq: Every day | ORAL | Status: DC
  Administered 2012-12-17 – 2012-12-20 (×4): 100 mg via ORAL
  Filled 2012-12-17 (×4): qty 1

## 2012-12-17 MED ORDER — ADULT MULTIVITAMIN W/MINERALS CH
1.0000 | ORAL_TABLET | Freq: Every day | ORAL | Status: DC
  Administered 2012-12-17 – 2012-12-20 (×4): 1 via ORAL
  Filled 2012-12-17 (×4): qty 1

## 2012-12-17 MED ORDER — LORAZEPAM 2 MG/ML IJ SOLN: 1.0000 mg | Freq: Four times a day (QID) | INTRAMUSCULAR | Status: DC | PRN

## 2012-12-17 MED ORDER — THIAMINE HCL 100 MG/ML IJ SOLN: 100.0000 mg | Freq: Every day | INTRAMUSCULAR | Status: DC

## 2012-12-17 MED ORDER — LORAZEPAM 1 MG PO TABS
1.0000 mg | ORAL_TABLET | Freq: Four times a day (QID) | ORAL | Status: AC | PRN
  Administered 2012-12-17 – 2012-12-19 (×4): 1 mg via ORAL
  Filled 2012-12-17 (×4): qty 1

## 2012-12-17 NOTE — ED Notes (Signed)
wanded by security 

## 2012-12-17 NOTE — ED Notes (Addendum)
PT reports daily was of beer , cocaine and marijuana multiple times a day for years . Last used this morning 12-17-12

## 2012-12-17 NOTE — ED Provider Notes (Signed)
CSN: [id number]     Arrival date & time 12/17/12  1618 History   First MD Initiated Contact with Patient 12/17/12 1744     Chief Complaint  Patient presents with  . Suicidal  . Medical Clearance   (Consider location/radiation/quality/duration/timing/severity/associated sxs/prior Treatment) Patient is a 57 y.o. male presenting with drug/alcohol assessment. The history is provided by the patient.  Drug / Alcohol Assessment Similar prior episodes: yes   Severity:  Severe Onset quality:  Gradual Timing:  Constant Progression:  Worsening Chronicity:  Recurrent Suspected agents:  Alcohol and cocaine Associated symptoms: hallucinations (hears a voice telling him to kill himself) and suicidal ideation   Associated symptoms: no abdominal pain and no shortness of breath     Past Medical History  Diagnosis Date  . Diabetes mellitus   . Hypertension   . Coronary artery disease   . MI (myocardial infarction) 27    age 68 per patient   Past Surgical History  Procedure Laterality Date  . Angioplasty  1984    at age 89   History reviewed. No pertinent family history. History  Substance Use Topics  . Smoking status: Current Every Day Smoker -- 0.50 packs/day    Types: Cigarettes  . Smokeless tobacco: Never Used  . Alcohol Use: No    Review of Systems  Constitutional: Negative for fever.  Respiratory: Negative for shortness of breath.   Cardiovascular: Negative for chest pain.  Gastrointestinal: Negative for abdominal pain.  Psychiatric/Behavioral: Positive for suicidal ideas and hallucinations (hears a voice telling him to kill himself). Negative for self-injury.  All other systems reviewed and are negative.    Allergies  Review of patient's allergies indicates no known allergies.  Home Medications   Current Outpatient Rx  Name  Route  Sig  Dispense  Refill  . aspirin EC 81 MG EC tablet   Oral   Take 1 tablet (81 mg total) by mouth daily.   30 tablet   1   .  buPROPion (WELLBUTRIN) 100 MG tablet   Oral   Take 100 mg by mouth at bedtime.         . furosemide (LASIX) 40 MG tablet   Oral   Take 1 tablet (40 mg total) by mouth daily.   30 tablet   0   . insulin glargine (LANTUS) 100 UNIT/ML injection   Subcutaneous   Inject 30 Units into the skin at bedtime.         Marland Kitchen lisinopril (PRINIVIL,ZESTRIL) 10 MG tablet   Oral   Take 1 tablet (10 mg total) by mouth daily.   30 tablet   0   . metFORMIN (GLUCOPHAGE) 1000 MG tablet   Oral   Take 500 mg by mouth 2 (two) times daily with a meal.          . potassium chloride (K-DUR) 10 MEQ tablet   Oral   Take 1 tablet (10 mEq total) by mouth daily.   14 tablet   0   . pravastatin (PRAVACHOL) 10 MG tablet   Oral   Take 1 tablet (10 mg total) by mouth daily.   30 tablet   0   . sertraline (ZOLOFT) 100 MG tablet   Oral   Take 100 mg by mouth at bedtime.         . traZODone (DESYREL) 50 MG tablet   Oral   Take 50 mg by mouth at bedtime.          BP 165/92  Pulse 80  Temp(Src) 98.9 F (37.2 C) (Oral)  Resp 20  SpO2 99% Physical Exam  Nursing note and vitals reviewed. Constitutional: He is oriented to person, place, and time. He appears well-developed and well-nourished.  HENT:  Head: Normocephalic and atraumatic.  Right Ear: External ear normal.  Left Ear: External ear normal.  Nose: Nose normal.  Eyes: Right eye exhibits no discharge. Left eye exhibits no discharge.  Neck: Neck supple.  Cardiovascular: Normal rate, regular rhythm, normal heart sounds and intact distal pulses.   Pulmonary/Chest: Effort normal and breath sounds normal. He has no rales.  Abdominal: Soft. There is no tenderness.  Musculoskeletal: He exhibits no edema and no tenderness.  Neurological: He is alert and oriented to person, place, and time.  Skin: Skin is warm and dry.  Psychiatric: He has a normal mood and affect. His speech is normal. He expresses suicidal ideation.    ED Course   Procedures (including critical care time) Labs Review Labs Reviewed  COMPREHENSIVE METABOLIC PANEL - Abnormal; Notable for the following:    Glucose, Bld 541 (*)    Albumin 3.4 (*)    AST 119 (*)    ALT 70 (*)    Alkaline Phosphatase 132 (*)    All other components within normal limits  ETHANOL - Abnormal; Notable for the following:    Alcohol, Ethyl (B) 135 (*)    All other components within normal limits  SALICYLATE LEVEL - Abnormal; Notable for the following:    Salicylate Lvl <2.0 (*)    All other components within normal limits  URINE RAPID DRUG SCREEN (HOSP PERFORMED) - Abnormal; Notable for the following:    Cocaine POSITIVE (*)    All other components within normal limits  GLUCOSE, CAPILLARY - Abnormal; Notable for the following:    Glucose-Capillary 401 (*)    All other components within normal limits  ACETAMINOPHEN LEVEL  CBC   Imaging Review No results found.  MDM   1. Hyperglycemia   2. Alcohol and drug abuse  57 year old male here for detox from alcohol and cocaine. States he is trying to get into the [place] program without success. He is also felt suicidal as he has not been able to get help for this. He appears well, and endorses a plan to overdose on alcohol and drugs for his suicide. Telepsych consulted. Given fluids for his hyperglycemia. No signs of DKA. Care transferred with psych consult pending.    Audree Camel, MD 12/18/12 [phone]

## 2012-12-17 NOTE — ED Notes (Signed)
PT requesting detox for "drugs and alcohol". States that he feels suicidal because he cannot get in anywhere. Does NOT endorse plan or intent. Request detox  For ETOH, cocaine, marijuana. PT is calm and cooperative at this time. PT states that he is a veteran and has attempted to get into [place] programs without success.

## 2012-12-18 LAB — POCT I-STAT 3, VENOUS BLOOD GAS (G3P V)
Acid-Base Excess: 1 mmol/L (ref 0.0–2.0)
Bicarbonate: 26.2 mEq/L — ABNORMAL HIGH (ref 20.0–24.0)
O2 Saturation: 84 %
pO2, Ven: 49 mmHg — ABNORMAL HIGH (ref 30.0–45.0)

## 2012-12-18 LAB — GLUCOSE, CAPILLARY
Glucose-Capillary: 282 mg/dL — ABNORMAL HIGH (ref 70–99)
Glucose-Capillary: 306 mg/dL — ABNORMAL HIGH (ref 70–99)

## 2012-12-18 LAB — POCT I-STAT 3, ART BLOOD GAS (G3+)
Acid-base deficit: 1 mmol/L (ref 0.0–2.0)
O2 Saturation: 96 %
Patient temperature: 98.6
pO2, Arterial: 82 mmHg (ref 80.0–100.0)

## 2012-12-18 MED ORDER — LISINOPRIL 10 MG PO TABS
10.0000 mg | ORAL_TABLET | Freq: Every day | ORAL | Status: DC
  Administered 2012-12-18 – 2012-12-20 (×3): 10 mg via ORAL
  Filled 2012-12-18 (×3): qty 1

## 2012-12-18 MED ORDER — ACETAMINOPHEN 500 MG PO TABS
1000.0000 mg | ORAL_TABLET | Freq: Once | ORAL | Status: AC
  Administered 2012-12-18: 1000 mg via ORAL
  Filled 2012-12-18: qty 2

## 2012-12-18 MED ORDER — FUROSEMIDE 20 MG PO TABS
40.0000 mg | ORAL_TABLET | Freq: Every day | ORAL | Status: DC
  Administered 2012-12-18 – 2012-12-20 (×3): 40 mg via ORAL
  Filled 2012-12-18 (×3): qty 2

## 2012-12-18 MED ORDER — ONDANSETRON 4 MG PO TBDP
4.0000 mg | ORAL_TABLET | Freq: Three times a day (TID) | ORAL | Status: DC | PRN
  Administered 2012-12-18: 4 mg via ORAL
  Filled 2012-12-18: qty 1

## 2012-12-18 MED ORDER — INSULIN ASPART 100 UNIT/ML ~~LOC~~ SOLN
0.0000 [IU] | Freq: Three times a day (TID) | SUBCUTANEOUS | Status: DC
  Administered 2012-12-18: 7 [IU] via SUBCUTANEOUS
  Administered 2012-12-18 – 2012-12-19 (×2): 5 [IU] via SUBCUTANEOUS
  Administered 2012-12-19: 3 [IU] via SUBCUTANEOUS
  Filled 2012-12-18 (×4): qty 1

## 2012-12-18 MED ORDER — SIMVASTATIN 5 MG PO TABS
5.0000 mg | ORAL_TABLET | Freq: Every day | ORAL | Status: DC
  Administered 2012-12-18 – 2012-12-20 (×3): 5 mg via ORAL
  Filled 2012-12-18 (×3): qty 1

## 2012-12-18 MED ORDER — SERTRALINE HCL 50 MG PO TABS
100.0000 mg | ORAL_TABLET | Freq: Every day | ORAL | Status: DC
  Administered 2012-12-18 – 2012-12-19 (×2): 100 mg via ORAL
  Filled 2012-12-18 (×3): qty 2

## 2012-12-18 MED ORDER — METHOCARBAMOL 500 MG PO TABS
500.0000 mg | ORAL_TABLET | Freq: Four times a day (QID) | ORAL | Status: DC | PRN
  Administered 2012-12-18 – 2012-12-20 (×3): 500 mg via ORAL
  Filled 2012-12-18 (×3): qty 1

## 2012-12-18 MED ORDER — TRAZODONE HCL 50 MG PO TABS
50.0000 mg | ORAL_TABLET | Freq: Every day | ORAL | Status: DC
  Administered 2012-12-18 – 2012-12-19 (×2): 50 mg via ORAL
  Filled 2012-12-18 (×2): qty 1

## 2012-12-18 MED ORDER — IBUPROFEN 200 MG PO TABS
600.0000 mg | ORAL_TABLET | Freq: Four times a day (QID) | ORAL | Status: DC | PRN
  Administered 2012-12-18 – 2012-12-19 (×2): 600 mg via ORAL
  Filled 2012-12-18 (×5): qty 1

## 2012-12-18 MED ORDER — INSULIN GLARGINE 100 UNIT/ML ~~LOC~~ SOLN
30.0000 [IU] | Freq: Every day | SUBCUTANEOUS | Status: DC
  Administered 2012-12-18: 30 [IU] via SUBCUTANEOUS
  Filled 2012-12-18 (×3): qty 0.3

## 2012-12-18 MED ORDER — POTASSIUM CHLORIDE ER 10 MEQ PO TBCR
10.0000 meq | EXTENDED_RELEASE_TABLET | Freq: Every day | ORAL | Status: DC
  Administered 2012-12-19 – 2012-12-20 (×2): 10 meq via ORAL
  Filled 2012-12-18 (×3): qty 1

## 2012-12-18 MED ORDER — BUPROPION HCL 100 MG PO TABS
100.0000 mg | ORAL_TABLET | Freq: Every day | ORAL | Status: DC
  Administered 2012-12-18 – 2012-12-20 (×2): 100 mg via ORAL
  Filled 2012-12-18 (×5): qty 1

## 2012-12-18 MED ORDER — METFORMIN HCL 500 MG PO TABS
500.0000 mg | ORAL_TABLET | Freq: Two times a day (BID) | ORAL | Status: DC
  Administered 2012-12-18 – 2012-12-20 (×5): 500 mg via ORAL
  Filled 2012-12-18 (×5): qty 1

## 2012-12-18 MED ORDER — ASPIRIN EC 81 MG PO TBEC
81.0000 mg | DELAYED_RELEASE_TABLET | Freq: Every day | ORAL | Status: DC
  Administered 2012-12-19 – 2012-12-20 (×2): 81 mg via ORAL
  Filled 2012-12-18 (×3): qty 1

## 2012-12-18 NOTE — ED Notes (Signed)
Notified RN 282 CBG

## 2012-12-18 NOTE — BH Assessment (Signed)
Tele Assessment Note   [name] is an 57 y.o. male.  Patient came to [facility] seeking help with detox from ETOH.  Patient reports that he had called [facility] earlier in the day and they said that they were full on detox beds but may have some rehab bed openings.  Patient has been drinking the equivalent of 1-2 fifths of "ice" beer or Wild [place] Rose daily for the last 3 weeks.  He has been drinking for years but his rate has increased to that level over the last three weeks.  Patient last drank on 08/25 and had 2 fifths worth over the preceeding 24 hours.  Patient uses crack cocaine and uses about $40 worth per day when he can afford it.  Last use was 08/25.  Patient admits to depression stemming from recent breakup with fiance, his homelessness and loss of an aunt 6 months ago.  Patient says he hears voices that tell him bad things but no command hallucinations.  No HI or audio hallucinations.  Patient initially had told EDP that he had plan to kill self by overdosing on ETOH and crack.  Considering his current stated rate of use, this would have happened already.  Pt says now that he would be safe to not harm self.  Patient has high blood sugar and this needs to be looked at when considering for admission.  Patient has no current medication and has not managed his diabetes.  Because of audio hallucinations and medical issue, patient is being referred to [facility] for placement consideration. Axis I: Alcohol Abuse and Major Depression, Recurrent severe Axis II: Deferred Axis III:  Past Medical History  Diagnosis Date  . Diabetes mellitus   . Hypertension   . Coronary artery disease   . MI (myocardial infarction) 38    age 89 per patient   Axis IV: economic problems, housing problems, occupational problems, other psychosocial or environmental problems, problems related to legal system/crime and problems with primary support group Axis V: 31-40 impairment in reality testing  Past Medical History:   Past Medical History  Diagnosis Date  . Diabetes mellitus   . Hypertension   . Coronary artery disease   . MI (myocardial infarction) 37    age 34 per patient    Past Surgical History  Procedure Laterality Date  . Angioplasty  1984    at age 89    Family History: History reviewed. No pertinent family history.  Social History:  reports that he has been smoking Cigarettes.  He has been smoking about 0.50 packs per day. He has never used smokeless tobacco. He reports that he uses illicit drugs (Cocaine and Marijuana) about 7 times per week. He reports that he does not drink alcohol.  Additional Social History:  Alcohol / Drug Use Pain Medications: N/A Prescriptions: See PTA medication list Over the Counter: See PTA medication list History of alcohol / drug use?: Yes Longest period of sobriety (when/how long): Pt cannot remember Negative Consequences of Use: Financial;Legal Withdrawal Symptoms: Cramps;Diarrhea;Fever / Chills;Irritability;Nausea / Vomiting;Patient aware of relationship between substance abuse and physical/medical complications;Sweats;Tremors;Weakness Substance #1 Name of Substance 1: ETOH.  Usually "ice" beer & Wild [place] Rose wine 1 - Age of First Use: 57 years of age 52 - Amount (size/oz): Pt reports drinking at least 1-2 fifths worth of wine or beer daily 1 - Frequency: Daily consumption 1 - Duration: Drinking for years but for the last 3 weeks at this rate. 1 - Last Use /  Amount: 08/25 Drank "more than a 5th" Substance #2 Name of Substance 2: Crack cocaine 2 - Age of First Use: 57 years of age 89 - Amount (size/oz): Amount varies, usually $40 worth per day  2 - Frequency: Will try to consume on a daily basis 2 - Duration: On-going  2 - Last Use / Amount: 08/25 got $40 worth  CIWA: CIWA-Ar BP: 146/82 mmHg Pulse Rate: 76 Nausea and Vomiting: mild nausea with no vomiting Tactile Disturbances: none Tremor: two Auditory Disturbances: not  present Paroxysmal Sweats: no sweat visible Visual Disturbances: very mild sensitivity Anxiety: mildly anxious Headache, Fullness in Head: none present Agitation: somewhat more than normal activity Orientation and Clouding of Sensorium: cannot do serial additions or is uncertain about date CIWA-Ar Total: 7 COWS:    Allergies: No Known Allergies  Home Medications:  (Not in a hospital admission)  OB/GYN Status:  No LMP for male patient.  General Assessment Data Location of Assessment: [facility] ED Is this a Tele or Face-to-Face Assessment?: Tele Assessment Is this an Initial Assessment or a Re-assessment for this encounter?: Initial Assessment Living Arrangements: Alone;Other (Comment) (Pt states he is currently homeless) Can pt return to current living arrangement?: Yes Admission Status: Voluntary Is patient capable of signing voluntary admission?: Yes Transfer from: Acute Hospital Referral Source: Self/Family/Friend     [facility] Crisis Care Plan Living Arrangements: Alone;Other (Comment) (Pt states he is currently homeless) Name of Psychiatrist: None Name of Therapist: N/A     Risk to self Suicidal Ideation: No-Not Currently/Within Last 6 Months Suicidal Intent: No-Not Currently/Within Last 6 Months Is patient at risk for suicide?: No Suicidal Plan?:  (Had told EDP he would OD on ETOH & crack) Access to Means: Yes Specify Access to Suicidal Means: ETOH & Crack daily use What has been your use of drugs/alcohol within the last 12 months?: Daily use of ETOH & Crack Previous Attempts/Gestures: No How many times?: 0 Other Self Harm Risks: SA issues Triggers for Past Attempts: None known Intentional Self Injurious Behavior: None Family Suicide History: Yes Recent stressful life event(s): Loss (Comment) (Broke up w/ fiance and also aunt passed away 6 months ago) Persecutory voices/beliefs?: Yes Depression: Yes Depression Symptoms: Despondent;Insomnia;Isolating;Loss of interest in  usual pleasures;Feeling worthless/self pity Substance abuse history and/or treatment for substance abuse?: Yes Suicide prevention information given to non-admitted patients: Not applicable  Risk to Others Homicidal Ideation: No Thoughts of Harm to Others: No Current Homicidal Intent: No Current Homicidal Plan: No Access to Homicidal Means: No Identified Victim: No one History of harm to others?: No Assessment of Violence: None Noted Violent Behavior Description: Pt calm and cooperative Does patient have access to weapons?: No Criminal Charges Pending?: Yes Describe Pending Criminal Charges: Possession of a crack rock Does patient have a court date: Yes Court Date:  ("Sometime next month (September))  Psychosis Hallucinations: Auditory (Voices tell him bad things) Delusions: None noted  Mental Status Report Appear/Hygiene: Disheveled Eye Contact: Good Motor Activity: Freedom of movement;Unremarkable Speech: Logical/coherent Level of Consciousness: Quiet/awake Mood: Depressed;Helpless;Sad Affect: Depressed;Sad Anxiety Level: Panic Attacks Panic attack frequency: 3-4x/W Most recent panic attack: Yesterday Thought Processes: Coherent;Relevant Judgement: Impaired Orientation: Person;Place;Situation;Appropriate for developmental age Obsessive Compulsive Thoughts/Behaviors: None  Cognitive Functioning Concentration: Decreased Memory: Recent Impaired;Remote Intact IQ: Average Insight: Fair Impulse Control: Poor Appetite: Good Weight Loss: 0 Weight Gain: 0 Sleep: No Change Total Hours of Sleep:  (<4H/D) Vegetative Symptoms: None  ADLScreening [facility] Assessment Services) Patient's cognitive ability adequate to safely complete daily activities?: Yes  Patient able to express need for assistance with ADLs?: Yes Independently performs ADLs?: Yes (appropriate for developmental age)  Prior Inpatient Therapy Prior Inpatient Therapy: Yes Prior Therapy Dates: 20 years ago Prior  Therapy Facilty/Provider(s): Pt mentioned [facility]? Reason for Treatment: Detox  Prior Outpatient Therapy Prior Outpatient Therapy: No Prior Therapy Dates: N/A Prior Therapy Facilty/Provider(s): N/A Reason for Treatment: N/A  ADL Screening (condition at time of admission) Patient's cognitive ability adequate to safely complete daily activities?: Yes Is the patient deaf or have difficulty hearing?: No Does the patient have difficulty seeing, even when wearing glasses/contacts?: No Does the patient have difficulty concentrating, remembering, or making decisions?: No Patient able to express need for assistance with ADLs?: Yes Does the patient have difficulty dressing or bathing?: No Independently performs ADLs?: Yes (appropriate for developmental age) Does the patient have difficulty walking or climbing stairs?: No Weakness of Legs: None Weakness of Arms/Hands: None       Abuse/Neglect Assessment (Assessment to be complete while patient is alone) Physical Abuse: Denies Verbal Abuse: Denies Sexual Abuse: Denies Exploitation of patient/patient's resources: Denies Self-Neglect: Denies     Merchant navy officer (For Healthcare) Advance Directive: Patient does not have advance directive;Patient would not like information    Additional Information 1:1 In Past 12 Months?: No CIRT Risk: No Elopement Risk: No Does patient have medical clearance?: Yes     Disposition:  Disposition Initial Assessment Completed for this Encounter: Yes Disposition of Patient: Inpatient treatment program;Referred to Type of inpatient treatment program: Adult Patient referred to:  (Referred to [facility] for placement consideration.)  Alexandria Lodge 12/18/2012 7:14 AM

## 2012-12-18 NOTE — ED Notes (Signed)
Pt online with tele-psych.

## 2012-12-18 NOTE — ED Notes (Signed)
MD informed of pt's HA. See new orders.

## 2012-12-18 NOTE — ED Notes (Signed)
Pt given crackers. Resting at this time.

## 2012-12-18 NOTE — ED Notes (Signed)
Patient requested and received a sprite.

## 2012-12-19 MED ORDER — DICYCLOMINE HCL 10 MG PO CAPS
20.0000 mg | ORAL_CAPSULE | Freq: Four times a day (QID) | ORAL | Status: DC | PRN
  Administered 2012-12-19 – 2012-12-20 (×2): 20 mg via ORAL
  Filled 2012-12-19 (×2): qty 2

## 2012-12-19 MED ORDER — LOPERAMIDE HCL 2 MG PO CAPS
4.0000 mg | ORAL_CAPSULE | Freq: Once | ORAL | Status: AC
  Administered 2012-12-19: 4 mg via ORAL
  Filled 2012-12-19: qty 2

## 2012-12-19 MED ORDER — INSULIN ASPART 100 UNIT/ML ~~LOC~~ SOLN: 0.0000 [IU] | Freq: Every day | SUBCUTANEOUS | Status: DC

## 2012-12-19 MED ORDER — LOPERAMIDE HCL 2 MG PO CAPS: 2.0000 mg | ORAL_CAPSULE | ORAL | Status: DC | PRN

## 2012-12-19 MED ORDER — INSULIN GLARGINE 100 UNIT/ML ~~LOC~~ SOLN
40.0000 [IU] | Freq: Every day | SUBCUTANEOUS | Status: DC
  Administered 2012-12-19: 40 [IU] via SUBCUTANEOUS
  Filled 2012-12-19 (×3): qty 0.4

## 2012-12-19 MED ORDER — INSULIN ASPART 100 UNIT/ML ~~LOC~~ SOLN
0.0000 [IU] | Freq: Three times a day (TID) | SUBCUTANEOUS | Status: DC
  Administered 2012-12-19: 11 [IU] via SUBCUTANEOUS
  Administered 2012-12-20: 3 [IU] via SUBCUTANEOUS
  Filled 2012-12-19 (×2): qty 1

## 2012-12-19 NOTE — ED Notes (Signed)
Tc from [facility] ,spoke with Russella Dar . MD at [facility] refusing PT due to Medical needs.

## 2012-12-19 NOTE — ED Notes (Signed)
PT STATES HE DOES NOT HAVE TO APPEAR IN COURT TOMORROW. STATES HE HAS AN ATTORNEY (SCHLOSSER) WHO IS GOING TO HANDLE THAT DATE

## 2012-12-19 NOTE — BH Assessment (Signed)
  Writer faxed referral to [facility] as Drinda Butts RN just Research officer, political party to say that [facility] does have beds available and Annette sts [facility] never received referral faxed by Bruce MHT earlier.   [name], [place] Assessment Counselor

## 2012-12-19 NOTE — BH Assessment (Signed)
BHH Assessment Progress Note      [name] from [facility] states that Dr Wendall Stade reports pt blood glucose needs to be below 200 for admission.  RN, Drinda Butts stated that this is the patient's baseline and reported she would follow up with [facility] for further review.

## 2012-12-19 NOTE — ED Notes (Signed)
SPOKE WITH [facility] ABOUT PT REFERRAL. THEY ADVISE THEY DONT HAVE PT PAPERWORK AND REQUEST PT PAPERWORK BE RESENT. CALLED PAIGE AT [facility] AND MADE HER AWARE AND SHE WILL TAKE CARE OF PAPERWORK TO [facility]. PAIGE ALSO MADE AWARE PT HAS COURT DATE TOMORROW.

## 2012-12-19 NOTE — ED Notes (Signed)
DISCUSSED WITH PT ABOUT THE CHANGE IN HIS INSULIN DOSAGE. HE VERBALIZES UNDERSTANDING

## 2012-12-19 NOTE — ED Notes (Signed)
Report to amy, rn

## 2012-12-19 NOTE — ED Notes (Signed)
Patient brought meal tray.

## 2012-12-19 NOTE — Progress Notes (Signed)
B,Wolfgang Finigan,MHT was requested by Berna Spare, TTS to conduct placement search for a 57 year old male who came to [facility] seeking help with detox. Due to patient endorsing SI patient will not be appropriate for detox facility. It has been recommended that patient seek inpatient treatment for depression, SI and substance dependency. In efforts to seek placement the following facilities were contacted;   [facility] under review pending placement availability [facility] no response as of 4am [facility] spoke with (Racheal) who report availability but want review until later this am, referral submitted [facility] no response [facility] spoke with Jennette Kettle) who reports to fax for review, referral submitted [facility] spoke with Maryelizabeth Kaufmann) who reports to fax for review, referral submitted [facility] spoke with Erskine Squibb) who reports to fax for review, referral submitted [facility] no response at 4:15 am [facility] spoke with Efraim Kaufmann) who reports at capacity [facility] no one availiable

## 2012-12-20 ENCOUNTER — Inpatient Hospital Stay (HOSPITAL_COMMUNITY)
Admission: AD | Admit: 2012-12-20 | Discharge: 2013-01-01 | DRG: 897 | Disposition: A | Discharge status: Home / Self Care | Payer: Medicare Other | Source: Intra-hospital | Attending: Psychiatry | Admitting: Psychiatry

## 2012-12-20 ENCOUNTER — Encounter (HOSPITAL_COMMUNITY): Payer: Self-pay | Admitting: *Deleted

## 2012-12-20 DIAGNOSIS — F1020 Alcohol dependence, uncomplicated: Secondary | ICD-10-CM

## 2012-12-20 DIAGNOSIS — F1420 Cocaine dependence, uncomplicated: Secondary | ICD-10-CM

## 2012-12-20 DIAGNOSIS — F1911 Other psychoactive substance abuse, in remission: Secondary | ICD-10-CM

## 2012-12-20 DIAGNOSIS — F1010 Alcohol abuse, uncomplicated: Secondary | ICD-10-CM | POA: Diagnosis present

## 2012-12-20 DIAGNOSIS — F1994 Other psychoactive substance use, unspecified with psychoactive substance-induced mood disorder: Secondary | ICD-10-CM | POA: Diagnosis present

## 2012-12-20 HISTORY — DX: Major depressive disorder, single episode, unspecified: F32.9

## 2012-12-20 HISTORY — DX: Depression, unspecified: F32.A

## 2012-12-20 HISTORY — DX: Mental disorder, not otherwise specified: F99

## 2012-12-20 LAB — GLUCOSE, CAPILLARY: Glucose-Capillary: 255 mg/dL — ABNORMAL HIGH (ref 70–99)

## 2012-12-20 MED ORDER — ACETAMINOPHEN 325 MG PO TABS: 650.0000 mg | ORAL_TABLET | Freq: Four times a day (QID) | ORAL | Status: DC | PRN

## 2012-12-20 MED ORDER — MAGNESIUM HYDROXIDE 400 MG/5ML PO SUSP: 30.0000 mL | Freq: Every day | ORAL | Status: DC | PRN

## 2012-12-20 MED ORDER — LOPERAMIDE HCL 2 MG PO CAPS
2.0000 mg | ORAL_CAPSULE | ORAL | Status: AC | PRN
  Administered 2012-12-21 – 2012-12-22 (×2): 4 mg via ORAL

## 2012-12-20 MED ORDER — CHLORDIAZEPOXIDE HCL 25 MG PO CAPS
25.0000 mg | ORAL_CAPSULE | Freq: Every day | ORAL | Status: AC
  Administered 2012-12-25: 25 mg via ORAL
  Filled 2012-12-20: qty 1

## 2012-12-20 MED ORDER — CHLORDIAZEPOXIDE HCL 25 MG PO CAPS
25.0000 mg | ORAL_CAPSULE | Freq: Three times a day (TID) | ORAL | Status: AC
  Administered 2012-12-22 – 2012-12-23 (×3): 25 mg via ORAL
  Filled 2012-12-20 (×3): qty 1

## 2012-12-20 MED ORDER — HYDROXYZINE HCL 25 MG PO TABS: 25.0000 mg | ORAL_TABLET | Freq: Four times a day (QID) | ORAL | Status: AC | PRN

## 2012-12-20 MED ORDER — INSULIN ASPART 100 UNIT/ML ~~LOC~~ SOLN
0.0000 [IU] | Freq: Every day | SUBCUTANEOUS | Status: DC
  Administered 2012-12-23: 2 [IU] via SUBCUTANEOUS
  Administered 2012-12-25: 3 [IU] via SUBCUTANEOUS
  Administered 2012-12-26 – 2012-12-27 (×2): 2 [IU] via SUBCUTANEOUS

## 2012-12-20 MED ORDER — CHLORDIAZEPOXIDE HCL 25 MG PO CAPS
25.0000 mg | ORAL_CAPSULE | Freq: Four times a day (QID) | ORAL | Status: AC | PRN
  Administered 2012-12-22 – 2012-12-23 (×2): 25 mg via ORAL
  Filled 2012-12-20 (×2): qty 1

## 2012-12-20 MED ORDER — VITAMIN B-1 100 MG PO TABS
100.0000 mg | ORAL_TABLET | Freq: Every day | ORAL | Status: DC
  Administered 2012-12-21 – 2012-12-31 (×11): 100 mg via ORAL
  Filled 2012-12-20 (×13): qty 1

## 2012-12-20 MED ORDER — TRAZODONE HCL 50 MG PO TABS
50.0000 mg | ORAL_TABLET | Freq: Every evening | ORAL | Status: DC | PRN
  Administered 2012-12-20: 50 mg via ORAL
  Filled 2012-12-20 (×5): qty 1

## 2012-12-20 MED ORDER — TRAZODONE HCL 50 MG PO TABS: 50.0000 mg | ORAL_TABLET | Freq: Every day | ORAL | Status: DC

## 2012-12-20 MED ORDER — ALUM & MAG HYDROXIDE-SIMETH 200-200-20 MG/5ML PO SUSP: 30.0000 mL | ORAL | Status: DC | PRN

## 2012-12-20 MED ORDER — CHLORDIAZEPOXIDE HCL 25 MG PO CAPS
25.0000 mg | ORAL_CAPSULE | Freq: Four times a day (QID) | ORAL | Status: AC
  Administered 2012-12-20 – 2012-12-22 (×6): 25 mg via ORAL
  Filled 2012-12-20 (×6): qty 1

## 2012-12-20 MED ORDER — ONDANSETRON 4 MG PO TBDP: 4.0000 mg | ORAL_TABLET | Freq: Four times a day (QID) | ORAL | Status: AC | PRN

## 2012-12-20 MED ORDER — CHLORDIAZEPOXIDE HCL 25 MG PO CAPS
25.0000 mg | ORAL_CAPSULE | ORAL | Status: AC
  Administered 2012-12-23 – 2012-12-24 (×2): 25 mg via ORAL
  Filled 2012-12-20 (×2): qty 1

## 2012-12-20 MED ORDER — INSULIN GLARGINE 100 UNIT/ML ~~LOC~~ SOLN
40.0000 [IU] | Freq: Every day | SUBCUTANEOUS | Status: DC
  Administered 2012-12-20: 40 [IU] via SUBCUTANEOUS

## 2012-12-20 MED ORDER — ADULT MULTIVITAMIN W/MINERALS CH
1.0000 | ORAL_TABLET | Freq: Every day | ORAL | Status: DC
  Administered 2012-12-21 – 2012-12-31 (×11): 1 via ORAL
  Filled 2012-12-20 (×13): qty 1

## 2012-12-20 MED ORDER — INSULIN ASPART 100 UNIT/ML ~~LOC~~ SOLN
0.0000 [IU] | Freq: Three times a day (TID) | SUBCUTANEOUS | Status: DC
  Administered 2012-12-21: 5 [IU] via SUBCUTANEOUS
  Administered 2012-12-21: 11 [IU] via SUBCUTANEOUS
  Administered 2012-12-21: 2 [IU] via SUBCUTANEOUS
  Administered 2012-12-22: 3 [IU] via SUBCUTANEOUS
  Administered 2012-12-22: 8 [IU] via SUBCUTANEOUS
  Administered 2012-12-22: 11 [IU] via SUBCUTANEOUS
  Administered 2012-12-23: 8 [IU] via SUBCUTANEOUS
  Administered 2012-12-23 (×2): 3 [IU] via SUBCUTANEOUS
  Administered 2012-12-24 (×2): 5 [IU] via SUBCUTANEOUS
  Administered 2012-12-24: 12:00:00 via SUBCUTANEOUS
  Administered 2012-12-25: 2 [IU] via SUBCUTANEOUS
  Administered 2012-12-25: 3 [IU] via SUBCUTANEOUS
  Administered 2012-12-25: 8 [IU] via SUBCUTANEOUS
  Administered 2012-12-26: 3 [IU] via SUBCUTANEOUS
  Administered 2012-12-26: 2 [IU] via SUBCUTANEOUS
  Administered 2012-12-26: 5 [IU] via SUBCUTANEOUS
  Administered 2012-12-27: 2 [IU] via SUBCUTANEOUS
  Administered 2012-12-27: 8 [IU] via SUBCUTANEOUS
  Administered 2012-12-27: 5 [IU] via SUBCUTANEOUS
  Administered 2012-12-28: 2 [IU] via SUBCUTANEOUS
  Administered 2012-12-28 – 2012-12-29 (×4): 5 [IU] via SUBCUTANEOUS
  Administered 2012-12-29: 3 [IU] via SUBCUTANEOUS
  Administered 2012-12-30 (×2): 5 [IU] via SUBCUTANEOUS
  Administered 2012-12-30: 2 [IU] via SUBCUTANEOUS
  Administered 2012-12-31: 17:00:00 via SUBCUTANEOUS
  Administered 2012-12-31: 3 [IU] via SUBCUTANEOUS

## 2012-12-20 NOTE — Progress Notes (Signed)
B.Keo Schirmer,MHT followed up with referral submitted to [facility] spoke with Trinna Post who reports decline due to being medically inappropriate. [facility] still reviewing referrals.

## 2012-12-20 NOTE — ED Notes (Signed)
Report given to RN

## 2012-12-20 NOTE — Progress Notes (Signed)
B.Armandina Iman,MHT received report from McKenzie at [facility] that patient has been declined.

## 2012-12-20 NOTE — ED Notes (Signed)
Breakfast tray has arrived; pt eating at this time; sitter at bedside

## 2012-12-20 NOTE — ED Notes (Signed)
Breakfast tray not here yet.   Called nutrition and it should be on the way.   Insulin and metformin will be given when breakfast tray arrives.

## 2012-12-20 NOTE — ED Notes (Signed)
Pt resting in bed at this time; sitter at bedside

## 2012-12-20 NOTE — Progress Notes (Signed)
B.Oyuki Hogan,MHT spoke with nurse from [facility] reporting that patient has been declined by Dr. Romeo Apple due to medical needs.

## 2012-12-20 NOTE — Progress Notes (Signed)
B.Emigdio Wildeman,MHT received report from [facility]) that patient has been accepted to [facility]. Writer completed support paperwork with patient, faxed to [facility] and provided original copies to attending RN, Demetrio Lapping who will arrange for transport via security.

## 2012-12-20 NOTE — Progress Notes (Signed)
57 year old male pt admitted on voluntary basis. On admission, pt reports wanting detox from ETOH and crack. Pt expressed that he is currently homeless and for the past 4 months has been using on a daily basis. Pt reports that he got out of jail about 4 months ago and has been using since. Pt stated he was there because of an assault charge that he stayed for 60 days and the assault charge stemmed from him finding his girlfriend cheating on him with another man. Pt reports that he is on disability and that he goes to the [place] in [place] for medical follow-up. Pt does endorse depression on admission but denies any SI and is able to contract for safety on the unit. Pt is tremulous on admission and is displaying some signs of withdrawal. Pt is unsure about plans after discharge but did state that he would like to get into [facility] or the [facility] that the [place] [facility]. Pt was oriented to the unit and safety maintained.

## 2012-12-20 NOTE — ED Notes (Signed)
Will check pt.'s blood sugar when breakfast tray arrives.

## 2012-12-20 NOTE — ED Notes (Signed)
Pt resting in bed.sitter at bedside

## 2012-12-20 NOTE — Tx Team (Signed)
Initial Interdisciplinary Treatment Plan  PATIENT STRENGTHS: (choose at least two) Ability for insight Average or above average intelligence Capable of independent living General fund of knowledge  PATIENT STRESSORS: Loss of girlfriend Substance abuse   PROBLEM LIST: Problem List/Patient Goals Date to be addressed Date deferred Reason deferred Estimated date of resolution  Substance Abuse 12/20/12     Depression 12/20/12                                                DISCHARGE CRITERIA:  Ability to meet basic life and health needs Improved stabilization in mood, thinking, and/or behavior Medical problems require only outpatient monitoring Motivation to continue treatment in a less acute level of care Withdrawal symptoms are absent or subacute and managed without 24-hour nursing intervention  PRELIMINARY DISCHARGE PLAN: Placement in alternative living arrangements  PATIENT/FAMIILY INVOLVEMENT: This treatment plan has been presented to and reviewed with the patient, [name], and/or family member, .  The patient and family have been given the opportunity to ask questions and make suggestions.  [name], [place] 12/20/2012, 9:50 PM

## 2012-12-20 NOTE — BHH Counselor (Addendum)
Writer spoke to [employer] [phone]) from [facility]. Writer was informed that the patient would be ran by their phyician to determine if the patient meets criteria for inpatient hospitalization.  Writer informed the nurse working with the patient Clydie Braun) that the patient is under review at [facility] and [facility]. Writer informed the nurse that the patient that the patient has been declined at [facility], [facility] and [facility].     from [facility].  Writer was informed that the patient would be ran by their phyician to determine if the patient meets criteria for inpatient hospitalization.   Writer informed the nurse working with the patient Clydie Braun) that the patient is under review at [facility] and [facility].  Writer informed the nurse that the patient that the patient has been declined at [facility], [facility] and [facility].

## 2012-12-20 NOTE — BH Assessment (Signed)
[name] at [facility] says they are at capacity and discharges won't happen until tomorrow at the earliest. Says pt hasn't been reviewed yet.   [name], [place] Assessment Counselor

## 2012-12-21 ENCOUNTER — Encounter (HOSPITAL_COMMUNITY): Payer: Self-pay | Admitting: Psychiatry

## 2012-12-21 DIAGNOSIS — F10239 Alcohol dependence with withdrawal, unspecified: Principal | ICD-10-CM

## 2012-12-21 DIAGNOSIS — F322 Major depressive disorder, single episode, severe without psychotic features: Secondary | ICD-10-CM

## 2012-12-21 DIAGNOSIS — F1010 Alcohol abuse, uncomplicated: Secondary | ICD-10-CM

## 2012-12-21 DIAGNOSIS — F1420 Cocaine dependence, uncomplicated: Secondary | ICD-10-CM | POA: Diagnosis present

## 2012-12-21 DIAGNOSIS — F1994 Other psychoactive substance use, unspecified with psychoactive substance-induced mood disorder: Secondary | ICD-10-CM | POA: Diagnosis present

## 2012-12-21 LAB — GLUCOSE, CAPILLARY
Glucose-Capillary: 282 mg/dL — ABNORMAL HIGH (ref 70–99)
Glucose-Capillary: 132 mg/dL — ABNORMAL HIGH (ref 70–99)

## 2012-12-21 MED ORDER — METFORMIN HCL 500 MG PO TABS
500.0000 mg | ORAL_TABLET | Freq: Two times a day (BID) | ORAL | Status: DC
  Administered 2012-12-21 – 2012-12-31 (×21): 500 mg via ORAL
  Filled 2012-12-21 (×3): qty 1
  Filled 2012-12-21: qty 14
  Filled 2012-12-21: qty 1
  Filled 2012-12-21 (×4): qty 14
  Filled 2012-12-21 (×4): qty 1
  Filled 2012-12-21: qty 14
  Filled 2012-12-21 (×4): qty 1
  Filled 2012-12-21 (×3): qty 14
  Filled 2012-12-21 (×4): qty 1
  Filled 2012-12-21: qty 14

## 2012-12-21 MED ORDER — FUROSEMIDE 40 MG PO TABS
40.0000 mg | ORAL_TABLET | Freq: Every day | ORAL | Status: DC
  Administered 2012-12-21 – 2012-12-27 (×5): 40 mg via ORAL
  Filled 2012-12-21 (×8): qty 1

## 2012-12-21 MED ORDER — TRAZODONE HCL 50 MG PO TABS
50.0000 mg | ORAL_TABLET | Freq: Every day | ORAL | Status: DC
  Administered 2012-12-21 – 2012-12-23 (×3): 50 mg via ORAL
  Filled 2012-12-21 (×4): qty 1

## 2012-12-21 MED ORDER — LISINOPRIL 10 MG PO TABS
10.0000 mg | ORAL_TABLET | Freq: Every day | ORAL | Status: DC
  Administered 2012-12-22 – 2012-12-27 (×4): 10 mg via ORAL
  Filled 2012-12-21 (×8): qty 1

## 2012-12-21 MED ORDER — ASPIRIN EC 81 MG PO TBEC
81.0000 mg | DELAYED_RELEASE_TABLET | Freq: Every day | ORAL | Status: DC
  Administered 2012-12-21 – 2012-12-31 (×11): 81 mg via ORAL
  Filled 2012-12-21: qty 14
  Filled 2012-12-21: qty 1
  Filled 2012-12-21: qty 14
  Filled 2012-12-21 (×2): qty 1
  Filled 2012-12-21 (×2): qty 14
  Filled 2012-12-21 (×3): qty 1
  Filled 2012-12-21: qty 14
  Filled 2012-12-21 (×3): qty 1

## 2012-12-21 MED ORDER — BUPROPION HCL 100 MG PO TABS
100.0000 mg | ORAL_TABLET | Freq: Every day | ORAL | Status: DC
  Administered 2012-12-21 – 2012-12-23 (×3): 100 mg via ORAL
  Filled 2012-12-21 (×7): qty 1

## 2012-12-21 MED ORDER — INSULIN GLARGINE 100 UNIT/ML ~~LOC~~ SOLN
30.0000 [IU] | Freq: Every day | SUBCUTANEOUS | Status: DC
  Administered 2012-12-21 – 2012-12-31 (×11): 30 [IU] via SUBCUTANEOUS
  Filled 2012-12-21: qty 0.3

## 2012-12-21 MED ORDER — SIMVASTATIN 5 MG PO TABS
5.0000 mg | ORAL_TABLET | Freq: Every day | ORAL | Status: DC
  Administered 2012-12-21 – 2012-12-31 (×9): 5 mg via ORAL
  Filled 2012-12-21: qty 14
  Filled 2012-12-21: qty 1
  Filled 2012-12-21: qty 14
  Filled 2012-12-21 (×2): qty 1
  Filled 2012-12-21 (×2): qty 14
  Filled 2012-12-21 (×3): qty 1
  Filled 2012-12-21: qty 14
  Filled 2012-12-21: qty 1

## 2012-12-21 MED ORDER — ENSURE COMPLETE PO LIQD
237.0000 mL | Freq: Two times a day (BID) | ORAL | Status: DC
  Administered 2012-12-22 (×2): 237 mL via ORAL

## 2012-12-21 MED ORDER — SERTRALINE HCL 100 MG PO TABS
100.0000 mg | ORAL_TABLET | Freq: Every day | ORAL | Status: DC
  Administered 2012-12-21 – 2012-12-31 (×11): 100 mg via ORAL
  Filled 2012-12-21 (×6): qty 1
  Filled 2012-12-21: qty 7
  Filled 2012-12-21: qty 1
  Filled 2012-12-21: qty 7
  Filled 2012-12-21 (×2): qty 1
  Filled 2012-12-21 (×3): qty 7

## 2012-12-21 MED ORDER — POTASSIUM CHLORIDE ER 10 MEQ PO TBCR
10.0000 meq | EXTENDED_RELEASE_TABLET | Freq: Every day | ORAL | Status: DC
  Administered 2012-12-21 – 2012-12-31 (×11): 10 meq via ORAL
  Filled 2012-12-21: qty 14
  Filled 2012-12-21 (×6): qty 1
  Filled 2012-12-21 (×2): qty 14
  Filled 2012-12-21 (×2): qty 1
  Filled 2012-12-21: qty 14
  Filled 2012-12-21: qty 1
  Filled 2012-12-21: qty 14

## 2012-12-21 NOTE — Progress Notes (Signed)
Adult Psychoeducational Group Note  Date:  12/21/2012 Time:  10:02 PM  Group Topic/Focus:  AA group  Participation Level:  Active  Participation Quality:  Appropriate  Affect:  Appropriate  Cognitive:  Alert  Insight: Appropriate  Engagement in Group:  Engaged  Modes of Intervention:  Discussion  Additional Comments:    [name] 12/21/2012, 10:02 PM

## 2012-12-21 NOTE — [facility] Suicide Risk Assessment (Signed)
Suicide Risk Assessment  Admission Assessment     Nursing information obtained from:  Patient Demographic factors:  Male;Low socioeconomic status;Living alone;Unemployed Current Mental Status:  NA Loss Factors:  Decline in physical health;Legal issues Historical Factors:  Family history of mental illness or substance abuse Risk Reduction Factors:  Sense of responsibility to family  CLINICAL FACTORS:   Depression:   Comorbid alcohol abuse/dependence Alcohol/Substance Abuse/Dependencies  COGNITIVE FEATURES THAT CONTRIBUTE TO RISK:  Closed-mindedness Polarized thinking Thought constriction (tunnel vision)    SUICIDE RISK:   Moderate:  Frequent suicidal ideation with limited intensity, and duration, some specificity in terms of plans, no associated intent, good self-control, limited dysphoria/symptomatology, some risk factors present, and identifiable protective factors, including available and accessible social support.  PLAN OF CARE: Supportive approach/coping skills/relapse prevention                               Address detox needs                               Reassess and address the co morbidities  I certify that inpatient services furnished can reasonably be expected to improve the patient's condition.  Link Burgeson A 12/21/2012, 4:24 PM
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
## 2012-12-21 NOTE — [facility] Group Notes (Signed)
[facility] LCSW Aftercare Discharge Planning Group Note   12/21/2012 9:42 AM  Participation Quality:  DID NOT ATTEND   Smart, [name]

## 2012-12-21 NOTE — [facility] Suicide Risk Assessment (Signed)
[facility] INPATIENT:  Family/Significant Other Suicide Prevention Education  Suicide Prevention Education:  Patient Refusal for Family/Significant Other Suicide Prevention Education: The patient [name] has refused to provide written consent for family/significant other to be provided Family/Significant Other Suicide Prevention Education during admission and/or prior to discharge.  Physician notified.  Pt refused to allow CSW to contact family/friend. SPE completed with pt and pt encouraged to ask questions and talk about concerns relating to SPE. SPI pamphlet provided to pt and he was encouraged to share information with support system.   Smart, [name] 12/21/2012, 4:00 PM

## 2012-12-21 NOTE — [facility] Group Notes (Signed)
[facility] LCSW Group Therapy  12/21/2012 2:29 PM  Type of Therapy:  Group Therapy  Participation Level:  Did Not Attend  Smart, Herbert Seta 12/21/2012, 2:29 PM

## 2012-12-21 NOTE — Progress Notes (Signed)
NUTRITION ASSESSMENT  Pt identified as at risk on the Malnutrition Screen Tool  INTERVENTION: 1. Educated patient on the importance of nutrition and encouraged intake of food and beverages. 2. Discussed weight goals. 3. Supplements: thiamine and MVI daily 4.  Ensure Complete po BID, each supplement provides 350 kcal and 13 grams of protein.   NUTRITION DIAGNOSIS: Unintentional weight loss related to sub-optimal intake as evidenced by pt report.   Goal: Pt to meet >/= 90% of their estimated nutrition needs.  Monitor:  PO intake  Assessment:  Patient admitted with SI, auditory hallucinations, and for detox of ETOH and crack.  Hx of DM on Glucophage and insulin.   HgbA1C 8.5 08/11/12.  Homeless for 4 months.  Reports better appetite and intake since admission.  Was not eating well prior to admit secondary to "increased drinking and drugging".  Reports UBW of 178 lbs 2 months ago.  Patient with a 4% weight loss in the past 2 months.     57 y.o. male  Height: Ht Readings from Last 1 Encounters:  12/20/12 6\' 3"  (1.905 m)    Weight: Wt Readings from Last 1 Encounters:  12/20/12 170 lb (77.111 kg)    Weight Hx: Wt Readings from Last 10 Encounters:  12/20/12 170 lb (77.111 kg)  08/24/12 174 lb 4.8 oz (79.062 kg)  08/14/12 172 lb (78.019 kg)  08/14/12 172 lb (78.019 kg)  04/30/12 170 lb (77.111 kg)  11/29/11 185 lb (83.915 kg)    BMI:  Body mass index is 21.25 kg/(m^2). Pt meets criteria for wnl based on current BMI.  Estimated Nutritional Needs: Kcal: 25-30 kcal/kg Protein: > 1 gram protein/kg Fluid: 1 ml/kcal  Diet Order: Carb Control Pt is also offered choice of unit snacks mid-morning and mid-afternoon.  Pt is eating as desired.   Lab results and medications reviewed.   Oran Rein, RD, LDN Clinical Inpatient Dietitian Pager:  [phone] Weekend and after hours pager:  [phone]

## 2012-12-21 NOTE — H&P (Signed)
Psychiatric Admission Assessment Adult  Patient Identification:  [name]  Date of Evaluation:  12/21/2012  Chief Complaint:  ALCOHOL DEPENDENCE  History of Present Illness: This is a 57 year old African-American male. Admitted to [facility] from the [facility] ED with complaints of alcohol/cocaine intoxication needing help. Patient reports, "I was taken to the [facility] ED by someone 2 days ago. I was in need of help with drugs and alcohol. I drink a lot of Vodka, and use too much crack almost everyday x 3 years. It worsened over the last 3 months. I use crack and I drink too much because it helps me forget all about my troubles. And again, alcohol and drug use has gotten me in trouble with the police. I was recently charged with possession of one rock, no DUIs. Substance abuse has caused me my car, fiance etc. I had 6 years sobriety under my belt, that was 4 years ago. I don't know why I relapsed. I was taken medicines for depression".  Elements:  Location:  [facility] adult unit. Quality:  patient is currently experiencing tremors, body aches, diaphoresis, abdominal cramps. Severity:  Severe, rated both depression/anxiety at #8. Timing:  "I have using crack and drinking heavily x 3 weeks". Duration:  "I;m a chronic alcoholic and drug abuser". Context:  "I use drugs and alcohol to help me forget my troubles".  Associated Signs/Synptoms:  Depression Symptoms:  depressed mood, psychomotor agitation, fatigue, hopelessness, anxiety,  (Hypo) Manic Symptoms:  Impulsivity, Irritable Mood,  Anxiety Symptoms:  Excessive Worry,  Psychotic Symptoms:  Hallucinations: None  PTSD Symptoms: Had a traumatic exposure:  Denies  Psychiatric Specialty Exam: Physical Exam  Constitutional: He is oriented to person, place, and time. He appears well-developed.  HENT:  Head: Normocephalic.  Eyes: Pupils are equal, round, and reactive to light.  Neck: Normal range of motion.   Cardiovascular: Normal rate.   Respiratory: Effort normal.  GI: Soft.  Musculoskeletal: Normal range of motion.  Neurological: He is alert and oriented to person, place, and time.  Skin: Skin is warm and dry.  Psychiatric: His speech is normal and behavior is normal. Thought content normal. His mood appears anxious (Rated #8). Cognition and memory are normal. He expresses impulsivity. He exhibits a depressed mood (Rated #8).    Review of Systems  Constitutional: Positive for chills, malaise/fatigue and diaphoresis.  HENT: Negative.   Eyes: Negative.   Respiratory: Negative.   Cardiovascular: Negative.   Gastrointestinal: Positive for abdominal pain.  Genitourinary: Negative.   Musculoskeletal: Positive for myalgias.  Skin: Negative.   Neurological: Positive for tremors and weakness.  Endo/Heme/Allergies: Negative.   Psychiatric/Behavioral: Positive for depression (Rated at #8) and substance abuse (Alcoholism, cocaine dependence). Negative for suicidal ideas, hallucinations and memory loss. The patient is nervous/anxious (Rated at #8) and has insomnia.     Blood pressure 94/60, pulse 69, temperature 97.8 F (36.6 C), temperature source Oral, resp. rate 18, height 6\' 3"  (1.905 m), weight 77.111 kg (170 lb).Body mass index is 21.25 kg/(m^2).  General Appearance: Disheveled  Eye Solicitor::  Fair  Speech:  Clear and Coherent and Normal Rate  Volume:  Normal  Mood:  Anxious, Depressed and Irritable  Affect:  Flat  Thought Process:  Coherent, Intact and Logical  Orientation:  Full (Time, Place, and Person)  Thought Content:  Rumination  Suicidal Thoughts:  No  Homicidal Thoughts:  No  Memory:  Immediate;   Good Recent;   Good Remote;   Good  Judgement:  Impaired  Insight:  Fair  Psychomotor Activity:  Restlessness and Tremor  Concentration:  Fair  Recall:  Good  Akathisia:  No  Handed:  Right  AIMS (if indicated):     Assets:  Communication Skills Desire for Improvement   Sleep:  Number of Hours: 6.25    Past Psychiatric History: Diagnosis: Alcohol dependence, cocaine dependence  [facility]ospital[facility]  [facility]  Substance Abuse Care: Been to 5 different rehab centers, could not remember the names.  Self-Mutilation: Denies  Suicidal Attempts: Denies attempts and or thoughts  Violent Behaviors: None reported   Past Medical History:   Past Medical History  Diagnosis Date  . Diabetes mellitus   . Hypertension   . Coronary artery disease   . MI (myocardial infarction) 46    age 64 per patient  . Mental disorder   . Depression    Cardiac History:  CAD, HTN, MI  Allergies:  No Known Allergies  PTA Medications: Prescriptions prior to admission  Medication Sig Dispense Refill  . aspirin EC 81 MG EC tablet Take 1 tablet (81 mg total) by mouth daily.  30 tablet  1  . buPROPion (WELLBUTRIN) 100 MG tablet Take 100 mg by mouth at bedtime.      . furosemide (LASIX) 40 MG tablet Take 1 tablet (40 mg total) by mouth daily.  30 tablet  0  . insulin glargine (LANTUS) 100 UNIT/ML injection Inject 30 Units into the skin at bedtime.      Marland Kitchen lisinopril (PRINIVIL,ZESTRIL) 10 MG tablet Take 1 tablet (10 mg total) by mouth daily.  30 tablet  0  . metFORMIN (GLUCOPHAGE) 1000 MG tablet Take 500 mg by mouth 2 (two) times daily with a meal.       . potassium chloride (K-DUR) 10 MEQ tablet Take 1 tablet (10 mEq total) by mouth daily.  14 tablet  0  . pravastatin (PRAVACHOL) 10 MG tablet Take 1 tablet (10 mg total) by mouth daily.  30 tablet  0  . sertraline (ZOLOFT) 100 MG tablet Take 100 mg by mouth at bedtime.      . traZODone (DESYREL) 50 MG tablet Take 50 mg by mouth at bedtime.        Previous Psychotropic Medications:  Medication/Dose  See medication lists               Substance Abuse History in the last 12 months:  yes  Consequences of Substance Abuse: Medical Consequences:  Liver damage, Possible death by overdose Legal  Consequences:  Arrests, jail time, Loss of driving privilege. Family Consequences:  Family discord, divorce and or separation.  Social History:  reports that he has been smoking Cigarettes.  He has been smoking about 0.50 packs per day. He has never used smokeless tobacco. He reports that  drinks alcohol. He reports that he uses illicit drugs (Cocaine and Marijuana) about 7 times per week. Additional Social History:  Current Place of Residence: [place], [place]  Place of Birth: [place], [place]  Family Members: None reported  Marital Status:  Single  Children: 0  Sons:  Daughters:  Relationships: Single  Education:  [employer] Financial planner Problems/Performance: Completed high school  Religious Beliefs/Practices: NA  History of Abuse (Emotional/Phsycial/Sexual): Denies  Occupational Experiences: Disabled  Hotel manager History:  Copywriter, advertising History: Pending legal charges on Marijuana possession  Hobbies/Interests: None reported  Family History:  History reviewed. No pertinent family history.  Results for orders placed during the hospital  encounter of 12/20/12 (from the past 72 hour(s))  GLUCOSE, CAPILLARY     Status: Abnormal   Collection Time    12/20/12  9:28 PM      Result Value Range   Glucose-Capillary 282 (*) 70 - 99 mg/dL   Comment 1 Notify RN     Comment 2 Documented in Chart    GLUCOSE, CAPILLARY     Status: Abnormal   Collection Time    12/21/12  6:12 AM      Result Value Range   Glucose-Capillary 130 (*) 70 - 99 mg/dL   Psychological Evaluations:  Assessment:   DSM5: Schizophrenia Disorders:  NA Obsessive-Compulsive Disorders:  NA Trauma-Stressor Disorders:  NA Substance/Addictive Disorders:  Alcohol Related Disorder - Mild (305.00) and Alcohol Withdrawal (291.81), Cocaine dependence Depressive Disorders:  Major Depressive Disorder - Severe (296.23)  AXIS I:  Alcohol Related Disorder - Mild (305.00) and Alcohol Withdrawal (291.81), Major depressive  disorder, severe AXIS II:  Deferred AXIS III:   Past Medical History  Diagnosis Date  . Diabetes mellitus   . Hypertension   . Coronary artery disease   . MI (myocardial infarction) 50    age 24 per patient  . Mental disorder   . Depression    AXIS IV:  other psychosocial or environmental problems and Polysubstance abuse/dependence AXIS V:  1-10 persistent dangerousness to self and others present  Treatment Plan/Recommendations: 1. Admit for crisis management and stabilization, estimated length of stay 3-5 days.  2. Medication management to reduce current symptoms to base line and improve the patient's overall level of functioning; (A) Continue with current treatment regimen in progress. 3. Treat health problems as indicated.  4. Develop treatment plan to decrease risk of relapse upon discharge and the need for readmission.  5. Psycho-social education regarding relapse prevention and self care.  6. Health care follow up as needed for medical problems.  7. Review, reconcile, and reinstate any pertinent home medications for other health issues where appropriate. 8. Call for consults with hospitalist for any additional specialty patient care services as needed.  Treatment Plan Summary: Daily contact with patient to assess and evaluate symptoms and progress in treatment Medication management Supportive approach/coping skills/relapse prevention Detox/reassess and address the co morbidities Current Medications:  Current Facility-Administered Medications  Medication Dose Route Frequency Provider Last Rate Last Dose  . acetaminophen (TYLENOL) tablet 650 mg  650 mg Oral Q6H PRN Nelly Rout, MD      . alum & mag hydroxide-simeth (MAALOX/MYLANTA) 200-200-20 MG/5ML suspension 30 mL  30 mL Oral Q4H PRN Nelly Rout, MD      . chlordiazePOXIDE (LIBRIUM) capsule 25 mg  25 mg Oral Q6H PRN Nelly Rout, MD      . chlordiazePOXIDE (LIBRIUM) capsule 25 mg  25 mg Oral QID Nelly Rout, MD   25  mg at 12/21/12 0815   Followed by  . [START ON 12/22/2012] chlordiazePOXIDE (LIBRIUM) capsule 25 mg  25 mg Oral TID Nelly Rout, MD       Followed by  . [START ON 12/23/2012] chlordiazePOXIDE (LIBRIUM) capsule 25 mg  25 mg Oral BH-qamhs Nelly Rout, MD       Followed by  . [START ON 12/25/2012] chlordiazePOXIDE (LIBRIUM) capsule 25 mg  25 mg Oral Daily Nelly Rout, MD      . hydrOXYzine (ATARAX/VISTARIL) tablet 25 mg  25 mg Oral Q6H PRN Nelly Rout, MD      . insulin aspart (novoLOG) injection 0-15 Units  0-15 Units Subcutaneous TID  WC Nelly Rout, MD   2 Units at 12/21/12 [phone]  . insulin aspart (novoLOG) injection 0-5 Units  0-5 Units Subcutaneous QHS Nelly Rout, MD      . insulin glargine (LANTUS) injection 40 Units  40 Units Subcutaneous QHS Nelly Rout, MD   40 Units at 12/20/12 2214  . loperamide (IMODIUM) capsule 2-4 mg  2-4 mg Oral PRN Nelly Rout, MD      . magnesium hydroxide (MILK OF MAGNESIA) suspension 30 mL  30 mL Oral Daily PRN Rachael Fee, MD      . multivitamin with minerals tablet 1 tablet  1 tablet Oral Daily Nelly Rout, MD   1 tablet at 12/21/12 0815  . ondansetron (ZOFRAN-ODT) disintegrating tablet 4 mg  4 mg Oral Q6H PRN Nelly Rout, MD      . thiamine (VITAMIN B-1) tablet 100 mg  100 mg Oral Daily Nelly Rout, MD   100 mg at 12/21/12 0815  . traZODone (DESYREL) tablet 50 mg  50 mg Oral QHS,MR X 1 Rachael Fee, MD   50 mg at 12/20/12 2211    Observation Level/Precautions:  15 minute checks  Laboratory:  Reviewed ED lab findings on file  Psychotherapy: Group sessions   Medications: See medication lists   Consultations:  As needed  Discharge Concerns:  Maintaining sobriety  Estimated LOS: 3-5 days  Other:     I certify that inpatient services furnished can reasonably be expected to improve the patient's condition.   Sanjuana Kava, PMHNP-BC 8/29/20149:18 AM Agree with assessment and plan Reymundo Poll. Dub Mikes, M.D.

## 2012-12-21 NOTE — Tx Team (Signed)
Interdisciplinary Treatment Plan Update (Adult)  Date: 12/21/2012   Time Reviewed: 10:40 AM  Progress in Treatment:  Attending groups: No.  Participating in groups:  No.  Taking medication as prescribed: Yes  Tolerating medication: Yes  Family/Significant othe contact made: Not yet. SPE required for this pt.  Patient understands diagnosis: Yes, AEB seeking treatment for SI, depression, substance abuse, and ETOH detox.  Discussing patient identified problems/goals with staff: Yes  Medical problems stabilized or resolved: Yes  Denies suicidal/homicidal ideation: No. Pt reports passive SI but is able to contract for safety.  Patient has not harmed self or Others: Yes  New problem(s) identified: Pt currently not attending d/c planning group. Discharge Plan or Barriers: CSW assessing for appropriate referrals. Pt currently goes to [facility] for medical followup. Pt may be interested in [facility] admission.  Additional comments: [name] is an 57 y.o. male.  Patient came to [facility] seeking help with detox from ETOH. Patient reports that he had called [facility] earlier in the day and they said that they were full on detox beds but may have some rehab bed openings. Patient has been drinking the equivalent of 1-2 fifths of "ice" beer or Wild [place] Rose daily for the last 3 weeks. He has been drinking for years but his rate has increased to that level over the last three weeks. Patient last drank on 08/25 and had 2 fifths worth over the preceeding 24 hours. Patient uses crack cocaine and uses about $40 worth per day when he can afford it. Last use was 08/25. Patient admits to depression stemming from recent breakup with fiance, his homelessness and loss of an aunt 6 months ago. Patient says he hears voices that tell him bad things but no command hallucinations. No HI or audio hallucinations. Patient initially had told EDP that he had plan to kill self by overdosing on ETOH and crack.  Reason for Continuation of  Hospitalization: Librium taper-withdrawals Mood stabilization SI Medication management Estimated length of stay: 3-5 days  For review of initial/current patient goals, please see plan of care.  Attendees:  Patient:    Family:    Physician: Geoffery Lyons MD 12/21/2012 10:40 AM   Nursing: Philippa Chester RN 12/21/2012 10:40 AM   Clinical Social Worker [employer], LCSWA  12/21/2012 10:40 AM   Other: Aggie PA 12/21/2012 10:40 AM   Other:    Other: Darden Dates Nurse CM 12/21/2012 10:40 AM   Other:    Scribe for Treatment Team:  [employer] LCSWA 12/21/2012 10:40 AM

## 2012-12-21 NOTE — Progress Notes (Signed)
D: Patient denies SI/HI and A/V hallucinations; patient reports some dizziness and tremors due to his withdrawal and fatigue  A: Monitored q 15 minutes; patient encouraged to attend groups; patient educated about medications; patient given medications per physician orders; patient encouraged to express feelings and/or concerns  R: Patient is now up ambulating and is brighter but still laying in the bed ; patient's interaction with staff and peers is minimal;; patient is taking medications as prescribed and tolerating medications; patient is not attending any groups

## 2012-12-21 NOTE — Progress Notes (Signed)
Adult Psychoeducational Group Note  Date:  12/21/2012 Time:  12:58 PM  Group Topic/Focus:  Relapse Prevention Planning:   The focus of this group is to define relapse and discuss the need for planning to combat relapse.  Participation Level:  Did Not Attend   [name] 12/21/2012, 12:58 PM

## 2012-12-22 DIAGNOSIS — F332 Major depressive disorder, recurrent severe without psychotic features: Secondary | ICD-10-CM

## 2012-12-22 LAB — GLUCOSE, CAPILLARY
Glucose-Capillary: 181 mg/dL — ABNORMAL HIGH (ref 70–99)
Glucose-Capillary: 296 mg/dL — ABNORMAL HIGH (ref 70–99)
Glucose-Capillary: 313 mg/dL — ABNORMAL HIGH (ref 70–99)

## 2012-12-22 NOTE — Progress Notes (Signed)
Patient ID: [name], male   DOB: 10-22-1955, 57 y.o.   MRN: [id number] D)  Was resting in bed this evening, stated was tired, wasn't sure he could get up and go to group, but did get up, make a quick phone call and attended group.  Felt it was a good group afterward and was glad he had attended.  Came to med window for hs meds afterward, went back to his room shortly afterward.  Affect flat, quiet , depressed, but denies thoughts of self harm A)  Will continue to monitor for safety, continue POC, Support and encouragement R)  Safety maintained.

## 2012-12-22 NOTE — Progress Notes (Signed)
Psychoeducational Group Note  Date:  12/22/2012 Time:  0945 am  Group Topic/Focus:  Identifying Needs:   The focus of this group is to help patients identify their personal needs that have been historically problematic and identify healthy behaviors to address their needs.  Participation Level:  Active  Participation Quality:  Appropriate and Sharing  Affect:  Appropriate  Cognitive:  Alert and Appropriate  Insight:  Developing/Improving  Engagement in Group:  Developing/Improving  Additional Comments:    Andrena Mews 12/22/2012,3:49 PM

## 2012-12-22 NOTE — [facility] Group Notes (Signed)
[facility] Group Notes:  (Nursing/MHT/Case Management/Adjunct)  Date:  12/22/2012  Time:  1:17 PM  Type of Therapy:  Psychoeducational Skills  Participation Level:  Did Not Attend   Buford Dresser 12/22/2012, 1:17 PM

## 2012-12-22 NOTE — Progress Notes (Signed)
Pt is awake and alert, denies HI or SI. Pt is medication compliant. Pt slept most of the morning. Pt has no medical complaint at this time, pt didn't attended morning group. Support and encouragement offered. Will continue to monitor for safety

## 2012-12-22 NOTE — [facility] Group Notes (Signed)
[facility] Group Notes: (Clinical Social Work)   12/22/2012      Type of Therapy:  Group Therapy   Participation Level:  Did Not Attend    Ambrose Mantle, LCSW 12/22/2012, 12:47 PM

## 2012-12-22 NOTE — Progress Notes (Signed)
[facility] MD Progress Note  12/22/2012 2:09 PM [name]  MRN:  [id number] Subjective:  Zubin reports that he is beginning to feel a little better. He reports that he is less depressed, but he is still getting irritated with other patients. He denies any suicidal or homicidal ideation today, but endorses hearing voices in his head that tell him to leave this place. He endorses good sleep and appetite. He expresses a desire to be discharged to a residential treatment facility upon completion of detox.  Diagnosis:   DSM5: Schizophrenia Disorders:   Obsessive-Compulsive Disorders:   Trauma-Stressor Disorders:   Substance/Addictive Disorders:  Alcohol Related Disorder - Severe (303.90) Depressive Disorders:  Major Depressive Disorder - Severe (296.23)  Axis I: Alcohol Abuse, Major Depression, Recurrent severe and Substance Induced Mood Disorder Axis II: Deferred Axis III:  Past Medical History  Diagnosis Date  . Diabetes mellitus   . Hypertension   . Coronary artery disease   . MI (myocardial infarction) 28    age 57 per patient  . Mental disorder   . Depression     ADL's:  Intact  Sleep: Good  Appetite:  Good  Suicidal Ideation:  Patient denies any thought, plan, or intent Homicidal Ideation:  Patient denies any thought, plan, or intent AEB (as evidenced by):  Psychiatric Specialty Exam: Review of Systems  Constitutional: Negative.   HENT: Negative.   Eyes: Negative.   Respiratory: Negative.   Cardiovascular: Negative.   Gastrointestinal: Negative.   Genitourinary: Negative.   Musculoskeletal: Negative.   Skin: Negative.   Neurological: Negative.   Endo/Heme/Allergies: Negative.   Psychiatric/Behavioral: Positive for depression and hallucinations.    Blood pressure 105/67, pulse 79, temperature 97.5 F (36.4 C), temperature source Oral, resp. rate 18, height 6\' 3"  (1.905 m), weight 77.111 kg (170 lb).Body mass index is 21.25 kg/(m^2).  General Appearance:  Casual  Eye Contact::  Good  Speech:  Clear and Coherent  Volume:  Decreased  Mood:  Dysphoric  Affect:  Congruent  Thought Process:  Linear  Orientation:  Full (Time, Place, and Person)  Thought Content:  WDL  Suicidal Thoughts:  No  Homicidal Thoughts:  No  Memory:  Immediate;   Fair Recent;   Fair Remote;   Fair  Judgement:  Fair  Insight:  Lacking  Psychomotor Activity:  Psychomotor Retardation  Concentration:  Good  Recall:  Good  Akathisia:  No  Handed:  Right  AIMS (if indicated):     Assets:  Communication Skills Desire for Improvement Resilience  Sleep:  Number of Hours: 6.5   Current Medications: Current Facility-Administered Medications  Medication Dose Route Frequency Provider Last Rate Last Dose  . acetaminophen (TYLENOL) tablet 650 mg  650 mg Oral Q6H PRN Nelly Rout, MD      . alum & mag hydroxide-simeth (MAALOX/MYLANTA) 200-200-20 MG/5ML suspension 30 mL  30 mL Oral Q4H PRN Nelly Rout, MD      . aspirin EC tablet 81 mg  81 mg Oral Daily Sanjuana Kava, NP   81 mg at 12/22/12 4098  . buPROPion [facility]) tablet 100 mg  100 mg Oral QHS Sanjuana Kava, NP   100 mg at 12/21/12 2339  . chlordiazePOXIDE (LIBRIUM) capsule 25 mg  25 mg Oral Q6H PRN Nelly Rout, MD      . chlordiazePOXIDE (LIBRIUM) capsule 25 mg  25 mg Oral TID Nelly Rout, MD   25 mg at 12/22/12 1210   Followed by  . [START ON 12/23/2012] chlordiazePOXIDE (  LIBRIUM) capsule 25 mg  25 mg Oral BH-qamhs Nelly Rout, MD       Followed by  . [START ON 12/25/2012] chlordiazePOXIDE (LIBRIUM) capsule 25 mg  25 mg Oral Daily Nelly Rout, MD      . feeding supplement (ENSURE COMPLETE) liquid 237 mL  237 mL Oral BID BM Jeoffrey Massed, RD   237 mL at 12/22/12 1409  . furosemide (LASIX) tablet 40 mg  40 mg Oral Daily Sanjuana Kava, NP   40 mg at 12/22/12 1610  . hydrOXYzine (ATARAX/VISTARIL) tablet 25 mg  25 mg Oral Q6H PRN Nelly Rout, MD      . insulin aspart (novoLOG) injection 0-15 Units  0-15  Units Subcutaneous TID WC Nelly Rout, MD   8 Units at 12/22/12 1208  . insulin aspart (novoLOG) injection 0-5 Units  0-5 Units Subcutaneous QHS Nelly Rout, MD      . insulin glargine (LANTUS) injection 30 Units  30 Units Subcutaneous QHS Sanjuana Kava, NP   30 Units at 12/21/12 2336  . lisinopril (PRINIVIL,ZESTRIL) tablet 10 mg  10 mg Oral Daily Sanjuana Kava, NP   10 mg at 12/22/12 9604  . loperamide (IMODIUM) capsule 2-4 mg  2-4 mg Oral PRN Nelly Rout, MD   4 mg at 12/21/12 2114  . magnesium hydroxide (MILK OF MAGNESIA) suspension 30 mL  30 mL Oral Daily PRN Rachael Fee, MD      . metFORMIN (GLUCOPHAGE) tablet 500 mg  500 mg Oral BID WC Sanjuana Kava, NP   500 mg at 12/22/12 5409  . multivitamin with minerals tablet 1 tablet  1 tablet Oral Daily Nelly Rout, MD   1 tablet at 12/22/12 [phone]  . ondansetron (ZOFRAN-ODT) disintegrating tablet 4 mg  4 mg Oral Q6H PRN Nelly Rout, MD      . potassium chloride (K-DUR) CR tablet 10 mEq  10 mEq Oral Daily Sanjuana Kava, NP   10 mEq at 12/22/12 1478  . sertraline (ZOLOFT) tablet 100 mg  100 mg Oral QHS Sanjuana Kava, NP   100 mg at 12/21/12 2117  . simvastatin (ZOCOR) tablet 5 mg  5 mg Oral q1800 Sanjuana Kava, NP   5 mg at 12/21/12 1716  . thiamine (VITAMIN B-1) tablet 100 mg  100 mg Oral Daily Nelly Rout, MD   100 mg at 12/22/12 1023  . traZODone (DESYREL) tablet 50 mg  50 mg Oral QHS Sanjuana Kava, NP   50 mg at 12/21/12 2117    Lab Results:  Results for orders placed during the hospital encounter of 12/20/12 (from the past 48 hour(s))  GLUCOSE, CAPILLARY     Status: Abnormal   Collection Time    12/20/12  9:28 PM      Result Value Range   Glucose-Capillary 282 (*) 70 - 99 mg/dL   Comment 1 Notify RN     Comment 2 Documented in Chart    GLUCOSE, CAPILLARY     Status: Abnormal   Collection Time    12/21/12  6:12 AM      Result Value Range   Glucose-Capillary 130 (*) 70 - 99 mg/dL  GLUCOSE, CAPILLARY     Status: Abnormal    Collection Time    12/21/12 11:49 AM      Result Value Range   Glucose-Capillary 220 (*) 70 - 99 mg/dL   Comment 1 Notify RN    GLUCOSE, CAPILLARY     Status: Abnormal  Collection Time    12/21/12  5:08 PM      Result Value Range   Glucose-Capillary 333 (*) 70 - 99 mg/dL  GLUCOSE, CAPILLARY     Status: Abnormal   Collection Time    12/21/12  9:42 PM      Result Value Range   Glucose-Capillary 132 (*) 70 - 99 mg/dL  GLUCOSE, CAPILLARY     Status: Abnormal   Collection Time    12/22/12  6:38 AM      Result Value Range   Glucose-Capillary 181 (*) 70 - 99 mg/dL    Physical Findings: AIMS: Facial and Oral Movements Muscles of Facial Expression: None, normal Lips and Perioral Area: None, normal Jaw: None, normal Tongue: None, normal,Extremity Movements Upper (arms, wrists, hands, fingers): None, normal Lower (legs, knees, ankles, toes): None, normal, Trunk Movements Neck, shoulders, hips: None, normal, Overall Severity Severity of abnormal movements (highest score from questions above): None, normal Incapacitation due to abnormal movements: None, normal Patient's awareness of abnormal movements (rate only patient's report): Aware, no distress, Dental Status Current problems with teeth and/or dentures?: No Does patient usually wear dentures?: No  CIWA:  CIWA-Ar Total: 6 COWS:     Treatment Plan Summary: Daily contact with patient to assess and evaluate symptoms and progress in treatment Medication management  Plan: We'll continue the current plan of care including his safe medical detox from alcohol, and treatment of his depression. We will research possibilities for residential treatment after completion of detox. He is interested in attending [facility], and has already completed the paperwork.  Medical Decision Making Problem Points:  Established problem, stable/improving (1) and Review of psycho-social stressors (1) Data Points:  Review or order clinical lab tests (1) Review  of medication regiment & side effects (2)  I certify that inpatient services furnished can reasonably be expected to improve the patient's condition.   Quinnley Colasurdo 12/22/2012, 2:09 PM

## 2012-12-22 NOTE — Progress Notes (Signed)
Adult Psychoeducational Group Note  Date:  12/22/2012 Time:  3:58 PM  Group Topic/Focus:  Healthy Communication:   The focus of this group is to discuss communication, barriers to communication, as well as healthy ways to communicate with others.  Participation Level:  Active  Participation Quality:  Appropriate  Affect:  Appropriate  Cognitive:  Appropriate  Insight: Appropriate  Engagement in Group:  Engaged  Modes of Intervention:  Activity and Discussion  Additional Comments:  Pt actively participated in communication activity.   Tora Perches N 12/22/2012, 3:58 PM

## 2012-12-23 DIAGNOSIS — F411 Generalized anxiety disorder: Secondary | ICD-10-CM

## 2012-12-23 LAB — GLUCOSE, CAPILLARY
Glucose-Capillary: 197 mg/dL — ABNORMAL HIGH (ref 70–99)
Glucose-Capillary: 285 mg/dL — ABNORMAL HIGH (ref 70–99)
Glucose-Capillary: 182 mg/dL — ABNORMAL HIGH (ref 70–99)

## 2012-12-23 MED ORDER — GLUCERNA SHAKE PO LIQD
237.0000 mL | Freq: Two times a day (BID) | ORAL | Status: DC
  Administered 2012-12-23 – 2012-12-31 (×12): 237 mL via ORAL

## 2012-12-23 NOTE — [facility] Group Notes (Signed)
[facility] Group Notes: (Clinical Social Work)   12/23/2012      Type of Therapy:  Group Therapy   Participation Level:  Did Not Attend    Ambrose Mantle, LCSW 12/23/2012, 1:04 PM

## 2012-12-23 NOTE — Progress Notes (Signed)
Patient ID: [name], male   DOB: May 27, 1955, 57 y.o.   MRN: [id number] D: Patient lethargic and slow this morning.  He continues to have withdrawal symptoms. Patient's blood pressure was running extremely low this am and lisinopril and lasix were held.  Patient advised to drink fluids and rise slowly when standing.  Patient is pleasant and cooperative.  He interacts well with staff and others. A: Continue to monitor MD orders and medication management.  Safety checks completed every 15 minutes per protocol. R: Patient's behavior is appropriate.

## 2012-12-23 NOTE — [facility] Group Notes (Signed)
[facility] Group Notes:  (Nursing/MHT/Case Management/Adjunct)  Date:  12/23/2012  Time:  10:34 AM  Type of Therapy:  Psychoeducational Skills  Participation Level:  Did Not Attend  [name] 12/23/2012, 10:34 AM

## 2012-12-23 NOTE — Progress Notes (Signed)
[facility] MD Progress Note  12/23/2012 12:08 PM [name]  MRN:  [id number] Subjective:  [name] is lying in bed today at noon, but reports that he had been up earlier in the day room. He endorses cravings for alcohol, but states he's "managing." He feels that his depression has gotten slightly worse today, and rates his depression as a 6 on scale of 1-10 where 10 is the worst. He also rates his anxiety as a 6 on a scale of 1-10. He reports that his appetite is good, and that he slept well last night. He denies any suicidal or homicidal ideation. He denies any auditory or visual hallucinations.  Diagnosis:   DSM5: Schizophrenia Disorders:   Obsessive-Compulsive Disorders:   Trauma-Stressor Disorders:   Substance/Addictive Disorders:  Alcohol Related Disorder - Severe (303.90) Depressive Disorders:  Major Depressive Disorder - Severe (296.23)  Axis I: Alcohol Abuse, Generalized Anxiety Disorder and Major Depression, Recurrent severe Axis II: Deferred Axis III:  Past Medical History  Diagnosis Date  . Diabetes mellitus   . Hypertension   . Coronary artery disease   . MI (myocardial infarction) 8    age 57 per patient  . Mental disorder   . Depression     ADL's:  Intact  Sleep: Good  Appetite:  Good  Suicidal Ideation:  Patient denies any thought, plan, or intent Homicidal Ideation:  Patient denies any thought, plan, or intent AEB (as evidenced by):  Psychiatric Specialty Exam: Review of Systems  Constitutional: Negative.   HENT: Negative.   Eyes: Negative.   Respiratory: Negative.   Cardiovascular: Negative.   Gastrointestinal: Negative.   Genitourinary: Negative.   Musculoskeletal: Negative.   Skin: Negative.   Neurological: Negative.   Endo/Heme/Allergies: Negative.   Psychiatric/Behavioral: Positive for depression. Negative for suicidal ideas and hallucinations. The patient is nervous/anxious. The patient does not have insomnia.     Blood pressure 101/62,  pulse 78, temperature 97.9 F (36.6 C), temperature source Oral, resp. rate 16, height 6\' 3"  (1.905 m), weight 77.111 kg (170 lb).Body mass index is 21.25 kg/(m^2).  General Appearance: Casual  Eye Contact::  Fair  Speech:  Clear and Coherent  Volume:  Normal  Mood:  Depressed  Affect:  Congruent  Thought Process:  Linear  Orientation:  Full (Time, Place, and Person)  Thought Content:  WDL  Suicidal Thoughts:  No  Homicidal Thoughts:  No  Memory:  Immediate;   Fair Recent;   Fair Remote;   Fair  Judgement:  Impaired  Insight:  Lacking  Psychomotor Activity:  Decreased  Concentration:  Fair  Recall:  Good  Akathisia:  No  Handed:  Right  AIMS (if indicated):     Assets:  Communication Skills Desire for Improvement  Sleep:  Number of Hours: 5.25   Current Medications: Current Facility-Administered Medications  Medication Dose Route Frequency Provider Last Rate Last Dose  . acetaminophen (TYLENOL) tablet 650 mg  650 mg Oral Q6H PRN Nelly Rout, MD      . alum & mag hydroxide-simeth (MAALOX/MYLANTA) 200-200-20 MG/5ML suspension 30 mL  30 mL Oral Q4H PRN Nelly Rout, MD      . aspirin EC tablet 81 mg  81 mg Oral Daily Sanjuana Kava, NP   81 mg at 12/23/12 0942  . buPROPion [facility]) tablet 100 mg  100 mg Oral QHS Sanjuana Kava, NP   100 mg at 12/22/12 2200  . chlordiazePOXIDE (LIBRIUM) capsule 25 mg  25 mg Oral Q6H PRN Nelly Rout,  MD   25 mg at 12/22/12 2019  . chlordiazePOXIDE (LIBRIUM) capsule 25 mg  25 mg Oral BH-qamhs Nelly Rout, MD       Followed by  . [START ON 12/25/2012] chlordiazePOXIDE (LIBRIUM) capsule 25 mg  25 mg Oral Daily Nelly Rout, MD      . feeding supplement (GLUCERNA SHAKE) liquid 237 mL  237 mL Oral BID BM Rachael Fee, MD   237 mL at 12/23/12 1046  . furosemide (LASIX) tablet 40 mg  40 mg Oral Daily Sanjuana Kava, NP   40 mg at 12/22/12 1610  . hydrOXYzine (ATARAX/VISTARIL) tablet 25 mg  25 mg Oral Q6H PRN Nelly Rout, MD      . insulin  aspart (novoLOG) injection 0-15 Units  0-15 Units Subcutaneous TID WC Nelly Rout, MD   3 Units at 12/23/12 [phone]  . insulin aspart (novoLOG) injection 0-5 Units  0-5 Units Subcutaneous QHS Nelly Rout, MD      . insulin glargine (LANTUS) injection 30 Units  30 Units Subcutaneous QHS Sanjuana Kava, NP   30 Units at 12/22/12 2204  . lisinopril (PRINIVIL,ZESTRIL) tablet 10 mg  10 mg Oral Daily Sanjuana Kava, NP   10 mg at 12/22/12 5409  . loperamide (IMODIUM) capsule 2-4 mg  2-4 mg Oral PRN Nelly Rout, MD   4 mg at 12/22/12 2020  . magnesium hydroxide (MILK OF MAGNESIA) suspension 30 mL  30 mL Oral Daily PRN Rachael Fee, MD      . metFORMIN (GLUCOPHAGE) tablet 500 mg  500 mg Oral BID WC Sanjuana Kava, NP   500 mg at 12/23/12 0942  . multivitamin with minerals tablet 1 tablet  1 tablet Oral Daily Nelly Rout, MD   1 tablet at 12/23/12 [phone]  . ondansetron (ZOFRAN-ODT) disintegrating tablet 4 mg  4 mg Oral Q6H PRN Nelly Rout, MD      . potassium chloride (K-DUR) CR tablet 10 mEq  10 mEq Oral Daily Sanjuana Kava, NP   10 mEq at 12/23/12 0943  . sertraline (ZOLOFT) tablet 100 mg  100 mg Oral QHS Sanjuana Kava, NP   100 mg at 12/22/12 2124  . simvastatin (ZOCOR) tablet 5 mg  5 mg Oral q1800 Sanjuana Kava, NP   5 mg at 12/22/12 1702  . thiamine (VITAMIN B-1) tablet 100 mg  100 mg Oral Daily Nelly Rout, MD   100 mg at 12/23/12 0943  . traZODone (DESYREL) tablet 50 mg  50 mg Oral QHS Sanjuana Kava, NP   50 mg at 12/22/12 2124    Lab Results:  Results for orders placed during the hospital encounter of 12/20/12 (from the past 48 hour(s))  GLUCOSE, CAPILLARY     Status: Abnormal   Collection Time    12/21/12  5:08 PM      Result Value Range   Glucose-Capillary 333 (*) 70 - 99 mg/dL  GLUCOSE, CAPILLARY     Status: Abnormal   Collection Time    12/21/12  9:42 PM      Result Value Range   Glucose-Capillary 132 (*) 70 - 99 mg/dL  GLUCOSE, CAPILLARY     Status: Abnormal   Collection Time     12/22/12  6:38 AM      Result Value Range   Glucose-Capillary 181 (*) 70 - 99 mg/dL  GLUCOSE, CAPILLARY     Status: Abnormal   Collection Time    12/22/12 11:53 AM  Result Value Range   Glucose-Capillary 296 (*) 70 - 99 mg/dL   Comment 1 Notify RN    GLUCOSE, CAPILLARY     Status: Abnormal   Collection Time    12/22/12  4:50 PM      Result Value Range   Glucose-Capillary 313 (*) 70 - 99 mg/dL   Comment 1 Notify RN     Comment 2 Documented in Chart    GLUCOSE, CAPILLARY     Status: Abnormal   Collection Time    12/22/12  9:28 PM      Result Value Range   Glucose-Capillary 160 (*) 70 - 99 mg/dL   Comment 1 Notify RN     Comment 2 Documented in Chart    GLUCOSE, CAPILLARY     Status: Abnormal   Collection Time    12/23/12  6:11 AM      Result Value Range   Glucose-Capillary 197 (*) 70 - 99 mg/dL  GLUCOSE, CAPILLARY     Status: Abnormal   Collection Time    12/23/12 12:02 PM      Result Value Range   Glucose-Capillary 285 (*) 70 - 99 mg/dL   Comment 1 Notify RN      Physical Findings: AIMS: Facial and Oral Movements Muscles of Facial Expression: None, normal Lips and Perioral Area: None, normal Jaw: None, normal Tongue: None, normal,Extremity Movements Upper (arms, wrists, hands, fingers): None, normal Lower (legs, knees, ankles, toes): None, normal, Trunk Movements Neck, shoulders, hips: None, normal, Overall Severity Severity of abnormal movements (highest score from questions above): None, normal Incapacitation due to abnormal movements: None, normal Patient's awareness of abnormal movements (rate only patient's report): Aware, no distress, Dental Status Current problems with teeth and/or dentures?: No Does patient usually wear dentures?: No  CIWA:  CIWA-Ar Total: 3 COWS:     Treatment Plan Summary: Daily contact with patient to assess and evaluate symptoms and progress in treatment Medication management Research options for followup care  Plan: We will  continue his current plan of care including safe medical detox. If cravings continue we will consider naltrexone. His depression does not resolve quickly, we will adjust his antidepressant medication.  Medical Decision Making Problem Points:  Established problem, worsening (2) and Review of psycho-social stressors (1) Data Points:  Review or order clinical lab tests (1) Review of medication regiment & side effects (2)  I certify that inpatient services furnished can reasonably be expected to improve the patient's condition.   Kayna Suppa 12/23/2012, 12:08 PM

## 2012-12-23 NOTE — Progress Notes (Signed)
[facility] Group Notes:  (Nursing/MHT/Case Management/Adjunct)  Date:  12/22/2012   Time:  2100 Type of Therapy:  wrap up group  Participation Level:  Minimal  Participation Quality:  Appropriate and Attentive  Affect:  Appropriate  Cognitive:  Appropriate  Insight:  Appropriate  Engagement in Group:  Limited  Modes of Intervention:  Clarification, Education and Support  Summary of Progress/Problems: Pt reports that he is planning on going to [facility].  Shelah Lewandowsky 12/23/2012, 3:57 AM

## 2012-12-23 NOTE — Progress Notes (Signed)
Patient ID: [name], male   DOB: 05-15-1955, 57 y.o.   MRN: [id number] D)  Has been out and about this evening, in good spirits, happy that his CBG was 160, states he has tried to watch his diet today and was hoping it was more in range than it has been.  Pleasant, conversational, talked about wanting to get into the program in [place] with the [place].  Also likes the programs in [place], intends to maintain his sobriety.  Attended group this evening, compliant with meds, diet, feels is getting himself back on track, feeling positive and focused. A)  Will continue to monitor for safety, support and encouragement, praise, continue POC R)  Appreciative, receptive, safety maintained.

## 2012-12-24 DIAGNOSIS — F1994 Other psychoactive substance use, unspecified with psychoactive substance-induced mood disorder: Secondary | ICD-10-CM

## 2012-12-24 LAB — GLUCOSE, CAPILLARY
Glucose-Capillary: 209 mg/dL — ABNORMAL HIGH (ref 70–99)
Glucose-Capillary: 152 mg/dL — ABNORMAL HIGH (ref 70–99)

## 2012-12-24 MED ORDER — BUPROPION HCL ER (XL) 150 MG PO TB24: 150.0000 mg | ORAL_TABLET | Freq: Every day | ORAL | Status: DC

## 2012-12-24 MED ORDER — BUPROPION HCL ER (XL) 150 MG PO TB24
150.0000 mg | ORAL_TABLET | Freq: Every day | ORAL | Status: DC
  Administered 2012-12-25 – 2012-12-31 (×7): 150 mg via ORAL
  Filled 2012-12-24: qty 1
  Filled 2012-12-24: qty 14
  Filled 2012-12-24: qty 1
  Filled 2012-12-24: qty 14
  Filled 2012-12-24: qty 1
  Filled 2012-12-24: qty 14
  Filled 2012-12-24: qty 1
  Filled 2012-12-24: qty 14
  Filled 2012-12-24 (×2): qty 1
  Filled 2012-12-24: qty 14
  Filled 2012-12-24: qty 1

## 2012-12-24 MED ORDER — TRAZODONE HCL 100 MG PO TABS
100.0000 mg | ORAL_TABLET | Freq: Every day | ORAL | Status: DC
  Administered 2012-12-24 – 2012-12-26 (×3): 100 mg via ORAL
  Filled 2012-12-24 (×4): qty 1

## 2012-12-24 NOTE — Progress Notes (Signed)
Patient ID: [name], male   DOB: 20-Jun-1955, 57 y.o.   MRN: [id number] He has been up for short periods of time then back to  Bed. Said that he was sleepy. Denies having any discomfort.

## 2012-12-24 NOTE — Progress Notes (Signed)
Patient did attend the evening speaker AA meeting.  

## 2012-12-24 NOTE — Progress Notes (Addendum)
[facility] MD Progress Note  12/24/2012 3:48 PM [name]  MRN:  [id number] Subjective:  [name] continues to endorse that he feels down, depressed States he has no support out there. Feels he needs to go from here to a rehab. Admits to feeling sad, with no energy no motivation.  Diagnosis:   DSM5: Schizophrenia Disorders:   Obsessive-Compulsive Disorders:   Trauma-Stressor Disorders:   Substance/Addictive Disorders:  Alcohol Related Disorder - Severe (303.90), Cocaine Dependence Depressive Disorders:  Major Depressive Disorder - Severe (296.23)  Axis I: Substance Induced Mood Disorder  ADL's:  Intact  Sleep: Poor  Appetite:  Poor  Suicidal Ideation:  Plan:  denies Intent:  denies Means:  denies Homicidal Ideation:  Plan:  denies Intent:  denies Means:  denies AEB (as evidenced by):  Psychiatric Specialty Exam: Review of Systems  HENT: Negative.   Eyes: Negative.   Respiratory: Negative.   Cardiovascular: Negative.   Gastrointestinal: Negative.   Genitourinary: Negative.   Musculoskeletal: Negative.   Skin: Negative.   Neurological: Positive for weakness.  Endo/Heme/Allergies: Negative.   Psychiatric/Behavioral: Positive for depression and substance abuse. The patient is nervous/anxious and has insomnia.     Blood pressure 92/60, pulse 87, temperature 97.5 F (36.4 C), temperature source Oral, resp. rate 18, height 6\' 3"  (1.905 m), weight 77.111 kg (170 lb).Body mass index is 21.25 kg/(m^2).  General Appearance: Disheveled  Eye Contact::  Minimal  Speech:  Clear and Coherent, Slow and not spontaneous  Volume:  Decreased  Mood:  Depressed  Affect:  Restricted  Thought Process:  Coherent and Goal Directed  Orientation:  Full (Time, Place, and Person)  Thought Content:  worries, concerns, a sense of hopelessness   Suicidal Thoughts:  No  Homicidal Thoughts:  No  Memory:  Immediate;   Poor Recent;   Poor Remote;   Fair  Judgement:  Fair  Insight:  Present and  Shallow  Psychomotor Activity:  Psychomotor Retardation  Concentration:  Fair  Recall:  Fair  Akathisia:  No  Handed:  Right  AIMS (if indicated):     Assets:  Desire for Improvement  Sleep:  Number of Hours: 6   Current Medications: Current Facility-Administered Medications  Medication Dose Route Frequency Provider Last Rate Last Dose  . acetaminophen (TYLENOL) tablet 650 mg  650 mg Oral Q6H PRN Nelly Rout, MD      . alum & mag hydroxide-simeth (MAALOX/MYLANTA) 200-200-20 MG/5ML suspension 30 mL  30 mL Oral Q4H PRN Nelly Rout, MD      . aspirin EC tablet 81 mg  81 mg Oral Daily Sanjuana Kava, NP   81 mg at 12/24/12 0854  . buPROPion [facility]) tablet 100 mg  100 mg Oral QHS Sanjuana Kava, NP   100 mg at 12/23/12 2142  . [START ON 12/25/2012] chlordiazePOXIDE (LIBRIUM) capsule 25 mg  25 mg Oral Daily Nelly Rout, MD      . feeding supplement (GLUCERNA SHAKE) liquid 237 mL  237 mL Oral BID BM Rachael Fee, MD   237 mL at 12/24/12 1537  . furosemide (LASIX) tablet 40 mg  40 mg Oral Daily Sanjuana Kava, NP   40 mg at 12/24/12 0854  . insulin aspart (novoLOG) injection 0-15 Units  0-15 Units Subcutaneous TID WC Nelly Rout, MD      . insulin aspart (novoLOG) injection 0-5 Units  0-5 Units Subcutaneous QHS Nelly Rout, MD   2 Units at 12/23/12 2142  . insulin glargine (LANTUS) injection 30  Units  30 Units Subcutaneous QHS Sanjuana Kava, NP   30 Units at 12/23/12 2143  . lisinopril (PRINIVIL,ZESTRIL) tablet 10 mg  10 mg Oral Daily Sanjuana Kava, NP   10 mg at 12/24/12 0854  . magnesium hydroxide (MILK OF MAGNESIA) suspension 30 mL  30 mL Oral Daily PRN Rachael Fee, MD      . metFORMIN (GLUCOPHAGE) tablet 500 mg  500 mg Oral BID WC Sanjuana Kava, NP   500 mg at 12/24/12 0854  . multivitamin with minerals tablet 1 tablet  1 tablet Oral Daily Nelly Rout, MD   1 tablet at 12/24/12 0854  . potassium chloride (K-DUR) CR tablet 10 mEq  10 mEq Oral Daily Sanjuana Kava, NP   10 mEq at  12/24/12 0854  . sertraline (ZOLOFT) tablet 100 mg  100 mg Oral QHS Sanjuana Kava, NP   100 mg at 12/23/12 2142  . simvastatin (ZOCOR) tablet 5 mg  5 mg Oral q1800 Sanjuana Kava, NP   5 mg at 12/23/12 1649  . thiamine (VITAMIN B-1) tablet 100 mg  100 mg Oral Daily Nelly Rout, MD   100 mg at 12/24/12 0854  . traZODone (DESYREL) tablet 100 mg  100 mg Oral QHS Rachael Fee, MD        Lab Results:  Results for orders placed during the hospital encounter of 12/20/12 (from the past 48 hour(s))  GLUCOSE, CAPILLARY     Status: Abnormal   Collection Time    12/22/12  4:50 PM      Result Value Range   Glucose-Capillary 313 (*) 70 - 99 mg/dL   Comment 1 Notify RN     Comment 2 Documented in Chart    GLUCOSE, CAPILLARY     Status: Abnormal   Collection Time    12/22/12  9:28 PM      Result Value Range   Glucose-Capillary 160 (*) 70 - 99 mg/dL   Comment 1 Notify RN     Comment 2 Documented in Chart    GLUCOSE, CAPILLARY     Status: Abnormal   Collection Time    12/23/12  6:11 AM      Result Value Range   Glucose-Capillary 197 (*) 70 - 99 mg/dL  GLUCOSE, CAPILLARY     Status: Abnormal   Collection Time    12/23/12 12:02 PM      Result Value Range   Glucose-Capillary 285 (*) 70 - 99 mg/dL   Comment 1 Notify RN    GLUCOSE, CAPILLARY     Status: Abnormal   Collection Time    12/23/12  4:59 PM      Result Value Range   Glucose-Capillary 182 (*) 70 - 99 mg/dL   Comment 1 Notify RN    GLUCOSE, CAPILLARY     Status: Abnormal   Collection Time    12/23/12  9:33 PM      Result Value Range   Glucose-Capillary 209 (*) 70 - 99 mg/dL  GLUCOSE, CAPILLARY     Status: Abnormal   Collection Time    12/24/12  6:05 AM      Result Value Range   Glucose-Capillary 209 (*) 70 - 99 mg/dL  GLUCOSE, CAPILLARY     Status: Abnormal   Collection Time    12/24/12 11:43 AM      Result Value Range   Glucose-Capillary 252 (*) 70 - 99 mg/dL    Physical Findings: AIMS: Facial and Oral  Movements Muscles  of Facial Expression: None, normal Lips and Perioral Area: None, normal Jaw: None, normal Tongue: None, normal,Extremity Movements Upper (arms, wrists, hands, fingers): None, normal Lower (legs, knees, ankles, toes): None, normal, Trunk Movements Neck, shoulders, hips: None, normal, Overall Severity Severity of abnormal movements (highest score from questions above): None, normal Incapacitation due to abnormal movements: None, normal Patient's awareness of abnormal movements (rate only patient's report): Aware, no distress, Dental Status Current problems with teeth and/or dentures?: No Does patient usually wear dentures?: No  CIWA:  CIWA-Ar Total: 0 COWS:     Treatment Plan Summary: Daily contact with patient to assess and evaluate symptoms and progress in treatment Medication management  Plan: Supportive approach/coping skills/relapse prevention           Optimize treatment with psychotropics            Increase the Wellbutrin to XL 150 mg in AM Medical Decision Making Problem Points:  Review of psycho-social stressors (1) Data Points:  Review of medication regiment & side effects (2) Review of new medications or change in dosage (2)  I certify that inpatient services furnished can reasonably be expected to improve the patient's condition.   Lexia Vandevender A 12/24/2012, 3:48 PM

## 2012-12-24 NOTE — Progress Notes (Signed)
Pt resting in bed with eyes closed. Breathing even and unlabored. Q15 min checks maintained for safety. Pt remains safe on the unit.   

## 2012-12-24 NOTE — Tx Team (Signed)
Interdisciplinary Treatment Plan Update (Adult)  Date: 12/24/2012   Time Reviewed: 12:12 PM  Progress in Treatment:  Attending groups: No.  Participating in groups:  No.  Taking medication as prescribed: Yes  Tolerating medication: Yes  Family/Significant othe contact made: Pt refused to consent to family contact. SPE completed with pt.  Patient understands diagnosis: Yes, AEB seeking treatment for SI, depression, substance abuse, and ETOH detox.  Discussing patient identified problems/goals with staff: Yes  Medical problems stabilized or resolved: Yes  Denies suicidal/homicidal ideation: Yes, self report.  Patient has not harmed self or Others: Yes  New problem(s) identified: Pt currently not attending d/c planning group. Discharge Plan or Barriers: [facility] referral sent. No beds available today.  Reason for Continuation of Hospitalization: Librium taper-withdrawals Mood stabilization Medication management Estimated length of stay: 1-2 days  For review of initial/current patient goals, please see plan of care.  Attendees:  Patient:    Family:    Physician: Geoffery Lyons MD 12/24/2012 12:12 PM   Nursing: Lupita Leash RN  12/24/2012 12:12 PM   Clinical Social Worker [employer], LCSWA  12/24/2012 12:12 PM   Other: Aggie PA 12/24/2012 12:12 PM   Other:    Other: Darden Dates Nurse CM 12/24/2012 12:12 PM   Other:    Scribe for Treatment Team:  [employer] LCSWA 12/24/2012 12:12 PM

## 2012-12-24 NOTE — Progress Notes (Signed)
Patient ID: [name], male   DOB: Jul 15, 1955, 57 y.o.   MRN: [id number] Did not attend group

## 2012-12-25 LAB — GLUCOSE, CAPILLARY
Glucose-Capillary: 162 mg/dL — ABNORMAL HIGH (ref 70–99)
Glucose-Capillary: 298 mg/dL — ABNORMAL HIGH (ref 70–99)
Glucose-Capillary: 148 mg/dL — ABNORMAL HIGH (ref 70–99)

## 2012-12-25 NOTE — Progress Notes (Signed)
[facility] MD Progress Note  12/25/2012 5:19 PM [name]  MRN:  [id number] Subjective:  Still with some aches and pains. He is still not feeling how he would like to feel. His head is clearer. His mood is still down. Worried about being able to maintain sobriety once he gets out of here. Would like to go straight to rehab. Diagnosis:   DSM5: Schizophrenia Disorders:   Obsessive-Compulsive Disorders:   Trauma-Stressor Disorders:   Substance/Addictive Disorders:  Alcohol Related Disorder - Severe (303.90), Cocaine Related Disorders Severe Depressive Disorders:  Major Depressive Disorder - Moderate (296.22)  Axis I: Substance Induced Mood Disorder  ADL's:  Intact  Sleep: Fair  Appetite:  Fair  Suicidal Ideation:  Plan:  denies Intent:  denies Means:  denies Homicidal Ideation:  Plan:  denies Intent:  denies Means:  denies AEB (as evidenced by):  Psychiatric Specialty Exam: Review of Systems  HENT: Negative.   Eyes: Negative.   Respiratory: Negative.   Cardiovascular: Negative.   Gastrointestinal: Negative.   Genitourinary: Negative.   Musculoskeletal: Negative.   Skin: Negative.   Neurological: Positive for weakness.  Endo/Heme/Allergies: Negative.   Psychiatric/Behavioral: Positive for depression and substance abuse. The patient is nervous/anxious.     Blood pressure 78/51, pulse 88, temperature 97.5 F (36.4 C), temperature source Oral, resp. rate 16, height 6\' 3"  (1.905 m), weight 77.111 kg (170 lb).Body mass index is 21.25 kg/(m^2).  General Appearance: Fairly Groomed  Patent attorney::  Fair  Speech:  Clear and Coherent and Slow  Volume:  Decreased  Mood:  Depressed  Affect:  Restricted  Thought Process:  Coherent and Goal Directed  Orientation:  Full (Time, Place, and Person)  Thought Content:  worries, concerns, somatically focused  Suicidal Thoughts:  No  Homicidal Thoughts:  No  Memory:  Immediate;   Fair Recent;   Fair Remote;   Fair  Judgement:  Fair   Insight:  Present and Shallow  Psychomotor Activity:  Restlessness  Concentration:  Fair  Recall:  Fair  Akathisia:  No  Handed:  Right  AIMS (if indicated):     Assets:  Desire for Improvement  Sleep:  Number of Hours: 6   Current Medications: Current Facility-Administered Medications  Medication Dose Route Frequency Provider Last Rate Last Dose  . acetaminophen (TYLENOL) tablet 650 mg  650 mg Oral Q6H PRN Nelly Rout, MD      . alum & mag hydroxide-simeth (MAALOX/MYLANTA) 200-200-20 MG/5ML suspension 30 mL  30 mL Oral Q4H PRN Nelly Rout, MD      . aspirin EC tablet 81 mg  81 mg Oral Daily Sanjuana Kava, NP   81 mg at 12/25/12 0844  . buPROPion (WELLBUTRIN XL) 24 hr tablet 150 mg  150 mg Oral Daily Rachael Fee, MD   150 mg at 12/25/12 0844  . feeding supplement (GLUCERNA SHAKE) liquid 237 mL  237 mL Oral BID BM Rachael Fee, MD   237 mL at 12/25/12 1400  . furosemide (LASIX) tablet 40 mg  40 mg Oral Daily Sanjuana Kava, NP   40 mg at 12/25/12 0844  . insulin aspart (novoLOG) injection 0-15 Units  0-15 Units Subcutaneous TID WC Nelly Rout, MD   2 Units at 12/25/12 1712  . insulin aspart (novoLOG) injection 0-5 Units  0-5 Units Subcutaneous QHS Nelly Rout, MD   2 Units at 12/23/12 2142  . insulin glargine (LANTUS) injection 30 Units  30 Units Subcutaneous QHS Sanjuana Kava, NP  30 Units at 12/24/12 2140  . lisinopril (PRINIVIL,ZESTRIL) tablet 10 mg  10 mg Oral Daily Sanjuana Kava, NP   10 mg at 12/25/12 0845  . magnesium hydroxide (MILK OF MAGNESIA) suspension 30 mL  30 mL Oral Daily PRN Rachael Fee, MD      . metFORMIN (GLUCOPHAGE) tablet 500 mg  500 mg Oral BID WC Sanjuana Kava, NP   500 mg at 12/25/12 1711  . multivitamin with minerals tablet 1 tablet  1 tablet Oral Daily Nelly Rout, MD   1 tablet at 12/25/12 0844  . potassium chloride (K-DUR) CR tablet 10 mEq  10 mEq Oral Daily Sanjuana Kava, NP   10 mEq at 12/25/12 0844  . sertraline (ZOLOFT) tablet 100 mg  100 mg  Oral QHS Sanjuana Kava, NP   100 mg at 12/24/12 2138  . simvastatin (ZOCOR) tablet 5 mg  5 mg Oral q1800 Sanjuana Kava, NP   5 mg at 12/24/12 1655  . thiamine (VITAMIN B-1) tablet 100 mg  100 mg Oral Daily Nelly Rout, MD   100 mg at 12/25/12 0845  . traZODone (DESYREL) tablet 100 mg  100 mg Oral QHS Rachael Fee, MD   100 mg at 12/24/12 2138    Lab Results:  Results for orders placed during the hospital encounter of 12/20/12 (from the past 48 hour(s))  GLUCOSE, CAPILLARY     Status: Abnormal   Collection Time    12/23/12  9:33 PM      Result Value Range   Glucose-Capillary 209 (*) 70 - 99 mg/dL  GLUCOSE, CAPILLARY     Status: Abnormal   Collection Time    12/24/12  6:05 AM      Result Value Range   Glucose-Capillary 209 (*) 70 - 99 mg/dL  GLUCOSE, CAPILLARY     Status: Abnormal   Collection Time    12/24/12 11:43 AM      Result Value Range   Glucose-Capillary 252 (*) 70 - 99 mg/dL  GLUCOSE, CAPILLARY     Status: Abnormal   Collection Time    12/24/12  4:51 PM      Result Value Range   Glucose-Capillary 215 (*) 70 - 99 mg/dL  GLUCOSE, CAPILLARY     Status: Abnormal   Collection Time    12/24/12  9:35 PM      Result Value Range   Glucose-Capillary 152 (*) 70 - 99 mg/dL  GLUCOSE, CAPILLARY     Status: Abnormal   Collection Time    12/25/12  6:06 AM      Result Value Range   Glucose-Capillary 162 (*) 70 - 99 mg/dL  GLUCOSE, CAPILLARY     Status: Abnormal   Collection Time    12/25/12 11:25 AM      Result Value Range   Glucose-Capillary 298 (*) 70 - 99 mg/dL   Comment 1 Notify RN    GLUCOSE, CAPILLARY     Status: Abnormal   Collection Time    12/25/12  4:53 PM      Result Value Range   Glucose-Capillary 148 (*) 70 - 99 mg/dL    Physical Findings: AIMS: Facial and Oral Movements Muscles of Facial Expression: None, normal Lips and Perioral Area: None, normal Jaw: None, normal Tongue: None, normal,Extremity Movements Upper (arms, wrists, hands, fingers): None,  normal Lower (legs, knees, ankles, toes): None, normal, Trunk Movements Neck, shoulders, hips: None, normal, Overall Severity Severity of abnormal movements (highest score from  questions above): None, normal Incapacitation due to abnormal movements: None, normal Patient's awareness of abnormal movements (rate only patient's report): No Awareness, Dental Status Current problems with teeth and/or dentures?: No Does patient usually wear dentures?: No  CIWA:  CIWA-Ar Total: 3 COWS:     Treatment Plan Summary: Daily contact with patient to assess and evaluate symptoms and progress in treatment Medication management  Plan: Supportive approach/coping skills/relapse prevention           Pursue the Wellbutrin XL 150 mg in AM           Continue the Zoloft 100 mg consider increasing the dose                   Medical Decision Making Problem Points:  Review of psycho-social stressors (1) Data Points:  Review of medication regiment & side effects (2) Review of new medications or change in dosage (2)  I certify that inpatient services furnished can reasonably be expected to improve the patient's condition.   Seraphim Trow A 12/25/2012, 5:19 PM

## 2012-12-25 NOTE — Progress Notes (Signed)
D:  Patient has stayed in the bed most of the day.  States he does not feel well and feels a bit light-headed when he gets up.  Blood sugar at lunch time was close to 300.  His affect is flat and his mood is blunted.  Blood pressure has been low today.  A:  Patient has been allowed to stay in bed most of the day.  Have encouraged him to push fluids.  Blood pressure monitored every four hours.   R:  Pleasant and cooperative with staff, but quiet.  No interaction with peers.  Safety is maintained with 15 minute checks.  Progressing through detox.

## 2012-12-25 NOTE — Progress Notes (Signed)
Adult Psychoeducational Group Note  Date:  12/25/2012 Time: 8:30 pm  Group Topic/Focus:  Wrap-Up Group:   The focus of this group is to help patients review their daily goal of treatment and discuss progress on daily workbooks.  Participation Level:  Active  Participation Quality:  Appropriate  Affect:  Flat  Cognitive:  Appropriate  Insight: Appropriate  Engagement in Group:  Engaged  Modes of Intervention:  Discussion, Education, Socialization and Support  Additional Comments:  Pt attended the group and was able to provide there reason behind his hospital visit. Pt stated that he was struggling with alcohol. Pt stated that he had been feeling depressed and frustrated. Pt stated that he wants to learn to practice better self control.   Coila Wardell 12/25/2012, 11:15 PM

## 2012-12-25 NOTE — [facility] Group Notes (Signed)
[facility] LCSW Group Therapy  12/25/2012 1:41 PM  Type of Therapy:  Group Therapy  Participation Level:  Did Not Attend  Smart, Revia Nghiem 12/25/2012, 1:41 PM

## 2012-12-25 NOTE — Progress Notes (Signed)
Patient did not attend 0900 orientation group. 

## 2012-12-25 NOTE — Progress Notes (Signed)
Adult Psychoeducational Group Note  Date:  12/25/2012 Time:  2:33 PM  Group Topic/Focus:  Recovery Goals:   The focus of this group is to identify appropriate goals for recovery and establish a plan to achieve them.  Participation Level:  Did Not Attend  [name] 12/25/2012, 2:33 PM

## 2012-12-26 DIAGNOSIS — F329 Major depressive disorder, single episode, unspecified: Secondary | ICD-10-CM

## 2012-12-26 LAB — GLUCOSE, CAPILLARY: Glucose-Capillary: 196 mg/dL — ABNORMAL HIGH (ref 70–99)

## 2012-12-26 NOTE — Progress Notes (Signed)
[facility] MD Progress Note  12/26/2012 2:36 PM [name]  MRN:  [id number] Subjective:  Still feeling "down" His BP has been running low. He is trying to stay more active, ambulate. He is still worried about not being able to go to a rehab from here. Concerned about what can happen if he was to be out there and he was to relapse.  Diagnosis:   DSM5: Schizophrenia Disorders:   Obsessive-Compulsive Disorders:   Trauma-Stressor Disorders:   Substance/Addictive Disorders:  Alcohol Related Disorder - Severe (303.90), Cocaine Related disorders Depressive Disorders:  Major Depressive Disorder - Moderate (296.22)  Axis I: Depressive Disorder NOS and Substance Induced Mood Disorder  ADL's:  Intact  Sleep: Fair  Appetite:  Fair  Suicidal Ideation:  Plan:  denies Intent:  denies Means:  denies Homicidal Ideation:  Plan:  denies Intent:  denies Means:  denies AEB (as evidenced by):  Psychiatric Specialty Exam: Review of Systems  Constitutional: Positive for malaise/fatigue.  HENT: Negative.   Eyes: Negative.   Respiratory: Negative.   Cardiovascular: Negative.   Gastrointestinal: Negative.   Genitourinary: Negative.   Musculoskeletal: Negative.   Skin: Negative.   Neurological: Positive for weakness.  Endo/Heme/Allergies: Negative.   Psychiatric/Behavioral: Positive for depression and substance abuse.    Blood pressure 114/78, pulse 71, temperature 98.9 F (37.2 C), temperature source Oral, resp. rate 16, height 6\' 3"  (1.905 m), weight 77.111 kg (170 lb).Body mass index is 21.25 kg/(m^2).  General Appearance: Fairly Groomed  Patent attorney::  Fair  Speech:  Clear and Coherent, Slow and not spontaneous  Volume:  Decreased  Mood:  Depressed  Affect:  Restricted  Thought Process:  Coherent and Goal Directed  Orientation:  Full (Time, Place, and Person)  Thought Content:  somatically focused, worries, concerns  Suicidal Thoughts:  No  Homicidal Thoughts:  No  Memory:   Immediate;   Fair Recent;   Fair Remote;   Fair  Judgement:  Fair  Insight:  Present  Psychomotor Activity:  Restlessness  Concentration:  Fair  Recall:  Fair  Akathisia:  No  Handed:  Right  AIMS (if indicated):     Assets:  Desire for Improvement  Sleep:  Number of Hours: 6.25   Current Medications: Current Facility-Administered Medications  Medication Dose Route Frequency Provider Last Rate Last Dose  . acetaminophen (TYLENOL) tablet 650 mg  650 mg Oral Q6H PRN Nelly Rout, MD      . alum & mag hydroxide-simeth (MAALOX/MYLANTA) 200-200-20 MG/5ML suspension 30 mL  30 mL Oral Q4H PRN Nelly Rout, MD      . aspirin EC tablet 81 mg  81 mg Oral Daily Sanjuana Kava, NP   81 mg at 12/26/12 1478  . buPROPion (WELLBUTRIN XL) 24 hr tablet 150 mg  150 mg Oral Daily Rachael Fee, MD   150 mg at 12/26/12 0811  . feeding supplement (GLUCERNA SHAKE) liquid 237 mL  237 mL Oral BID BM Rachael Fee, MD   237 mL at 12/26/12 1011  . furosemide (LASIX) tablet 40 mg  40 mg Oral Daily Sanjuana Kava, NP   40 mg at 12/25/12 0844  . insulin aspart (novoLOG) injection 0-15 Units  0-15 Units Subcutaneous TID WC Nelly Rout, MD   5 Units at 12/26/12 1202  . insulin aspart (novoLOG) injection 0-5 Units  0-5 Units Subcutaneous QHS Nelly Rout, MD   3 Units at 12/25/12 2127  . insulin glargine (LANTUS) injection 30 Units  30 Units Subcutaneous  QHS Sanjuana Kava, NP   30 Units at 12/25/12 2126  . lisinopril (PRINIVIL,ZESTRIL) tablet 10 mg  10 mg Oral Daily Sanjuana Kava, NP   10 mg at 12/25/12 0845  . magnesium hydroxide (MILK OF MAGNESIA) suspension 30 mL  30 mL Oral Daily PRN Rachael Fee, MD      . metFORMIN (GLUCOPHAGE) tablet 500 mg  500 mg Oral BID WC Sanjuana Kava, NP   500 mg at 12/26/12 0811  . multivitamin with minerals tablet 1 tablet  1 tablet Oral Daily Nelly Rout, MD   1 tablet at 12/26/12 [phone]  . potassium chloride (K-DUR) CR tablet 10 mEq  10 mEq Oral Daily Sanjuana Kava, NP   10 mEq at  12/26/12 0811  . sertraline (ZOLOFT) tablet 100 mg  100 mg Oral QHS Sanjuana Kava, NP   100 mg at 12/25/12 2126  . simvastatin (ZOCOR) tablet 5 mg  5 mg Oral q1800 Sanjuana Kava, NP   5 mg at 12/24/12 1655  . thiamine (VITAMIN B-1) tablet 100 mg  100 mg Oral Daily Nelly Rout, MD   100 mg at 12/26/12 1191  . traZODone (DESYREL) tablet 100 mg  100 mg Oral QHS Rachael Fee, MD   100 mg at 12/25/12 2125    Lab Results:  Results for orders placed during the hospital encounter of 12/20/12 (from the past 48 hour(s))  GLUCOSE, CAPILLARY     Status: Abnormal   Collection Time    12/24/12  4:51 PM      Result Value Range   Glucose-Capillary 215 (*) 70 - 99 mg/dL  GLUCOSE, CAPILLARY     Status: Abnormal   Collection Time    12/24/12  9:35 PM      Result Value Range   Glucose-Capillary 152 (*) 70 - 99 mg/dL  GLUCOSE, CAPILLARY     Status: Abnormal   Collection Time    12/25/12  6:06 AM      Result Value Range   Glucose-Capillary 162 (*) 70 - 99 mg/dL  GLUCOSE, CAPILLARY     Status: Abnormal   Collection Time    12/25/12 11:25 AM      Result Value Range   Glucose-Capillary 298 (*) 70 - 99 mg/dL   Comment 1 Notify RN    GLUCOSE, CAPILLARY     Status: Abnormal   Collection Time    12/25/12  4:53 PM      Result Value Range   Glucose-Capillary 148 (*) 70 - 99 mg/dL  GLUCOSE, CAPILLARY     Status: Abnormal   Collection Time    12/25/12  8:51 PM      Result Value Range   Glucose-Capillary 272 (*) 70 - 99 mg/dL  GLUCOSE, CAPILLARY     Status: Abnormal   Collection Time    12/26/12  6:33 AM      Result Value Range   Glucose-Capillary 135 (*) 70 - 99 mg/dL  GLUCOSE, CAPILLARY     Status: Abnormal   Collection Time    12/26/12 11:36 AM      Result Value Range   Glucose-Capillary 221 (*) 70 - 99 mg/dL    Physical Findings: AIMS: Facial and Oral Movements Muscles of Facial Expression: None, normal Lips and Perioral Area: None, normal Jaw: None, normal Tongue: None,  normal,Extremity Movements Upper (arms, wrists, hands, fingers): None, normal Lower (legs, knees, ankles, toes): None, normal, Trunk Movements Neck, shoulders, hips: None, normal, Overall Severity  Severity of abnormal movements (highest score from questions above): None, normal Incapacitation due to abnormal movements: None, normal Patient's awareness of abnormal movements (rate only patient's report): No Awareness, Dental Status Current problems with teeth and/or dentures?: No Does patient usually wear dentures?: No  CIWA:  CIWA-Ar Total: 0 COWS:     Treatment Plan Summary: Daily contact with patient to assess and evaluate symptoms and progress in treatment Medication management  Plan: Supportive approach/coping skills/relapse prevention           Detox/optimize treatment with Wellbutrin/Zoloft           Adjust BP meds if necessary  Medical Decision Making Problem Points:  Review of last therapy session (1) and Review of psycho-social stressors (1) Data Points:  Review of medication regiment & side effects (2)  I certify that inpatient services furnished can reasonably be expected to improve the patient's condition.   Sanad Fearnow A 12/26/2012, 2:36 PM

## 2012-12-26 NOTE — Progress Notes (Signed)
Patient ID: STEPHANO ARRANTS, male   DOB: [birth year], 57 y.o.   MRN: [id number] PER STATE REGULATIONS 482.30  THIS CHART WAS REVIEWED FOR MEDICAL NECESSITY WITH RESPECT TO THE PATIENT'S ADMISSION/ DURATION OF STAY.  NEXT REVIEW DATE: 12/28/2012  [name], RN, BSN CASE MANAGER

## 2012-12-26 NOTE — Progress Notes (Signed)
Recreation Therapy Notes  Date: 09.02.2014 Time: 3:00pm Location: [address] Dayroom  Group Topic: Communication, Team Building, Problem Solving  Goal Area(s) Addresses:  Patient will effectively work with peer towards shared goal.  Patient will identify skill used to make activity successful.  Patient will identify how skills used during activity can be used to reach post d/c goals.   Behavioral Response: Did not attend.   Marykay Lex Samatha Anspach, LRT/CTRS  Guenther Dunshee L 12/26/2012 8:09 AM

## 2012-12-26 NOTE — Progress Notes (Signed)
Patient ID: [name], male   DOB: 10-28-1955, 57 y.o.   MRN: [id number] PER STATE REGULATIONS 482.30  THIS CHART WAS REVIEWED FOR MEDICAL NECESSITY WITH RESPECT TO THE PATIENT'S ADMISSION/ DURATION OF STAY.  NEXT REVIEW DATE: 12/24/2012  [name], RN, BSN CASE MANAGER

## 2012-12-26 NOTE — [facility] Group Notes (Signed)
[facility] LCSW Aftercare Discharge Planning Group Note   12/26/2012 9:25 AM  Participation Quality:  Appropriate   Mood/Affect:  Appropriate  Depression Rating:  7  Anxiety Rating:  7  Thoughts of Suicide:  No Will you contract for safety?   NA  Current AVH:  No  Plan for Discharge/Comments: Pt reports that he has been eating better but had a poor night's sleep last night. Pt reports mild withdrawal symtpoms at this time. CSW informed pt that [facility] referral was sent yesterday. Pt also has [facility] date of Sept 9th. Pt to followup at [facility] for med management.     Transportation Means: unknown at this time.   Supports: none identified.   Smart, [employer]

## 2012-12-26 NOTE — [facility] Group Notes (Signed)
[facility] LCSW Group Therapy  12/26/2012 3:01 PM  Type of Therapy:  Group Therapy  Participation Level:  Did Not Attend  Smart, [name] 12/26/2012, 3:01 PM  

## 2012-12-26 NOTE — Progress Notes (Signed)
D: Patient in the dayroom on approach.  Patient animated and cooperative on approach.  Patient states he has been doing good.  Patient states he has ted to keep a good diet with his diabetes.  Patient states he is trying to pick snacks that will not make his blood sugar go up.  Patient states he has learned to 3 main steps to AA but could not recall them at the moment.  Patient denies SI/HI and denies AVH.     A: Staff to monitor Q 15 mins for safety.  Encouragement and support offered.  Scheduled medications administered per orders. R: Patient remains safe on the unit.  Patient attended group tonight.  Patient visible on the unit and interacting with peers.  Patient calm, cooperative and taking administered medications.

## 2012-12-26 NOTE — Progress Notes (Signed)
Patient ID: [name], male   DOB: [birth year], 57 y.o.   MRN: [id number] Pt did not attend 11am Psychoeducational group.

## 2012-12-26 NOTE — [facility] Group Notes (Signed)
Adult Psychoeducational Group Note  Date:  12/26/2012 Time:  10:54 PM  Group Topic/Focus:  Recovery Goals:   The focus of this group is to identify appropriate goals for recovery and establish a plan to achieve them.  Participation Level:  Active  Participation Quality:  Appropriate  Affect:  Appropriate  Cognitive:  Alert  Insight: Appropriate  Engagement in Group:  Supportive  Modes of Intervention:  Education  Additional Comments:  Patient actively participated in group offering support to other patients.    Delia Chimes 12/26/2012, 10:54 PM

## 2012-12-26 NOTE — Progress Notes (Signed)
D: Patient denies SI/HI/AVH. Patient rates hopelessness as 10,  depression as 8, and anxiety as 8.  Patient affect and mood are depressed.  Pt states, "It's too much stuff going on here.  All this arguing and fighting has me anxious.  I feel like I'm back on the streets.  I just want to get some rest."  Patient did attend evening group. Patient visible on the milieu. No distress noted. A: Support and encouragement offered. Scheduled medications given to pt. Q 15 min checks continued for patient safety. R: Patient receptive. Patient remains safe on the unit.

## 2012-12-26 NOTE — Progress Notes (Signed)
D: Patient appropriate and cooperative with staff. Patient's affect/mood is flat, blunted and depressed. Declined self inventory sheet today. He's attending groups and compliant with medication regimen. Patient's blood pressure continues to run low; MD has been made aware. Writer observed patient having minimal interaction on the unit and lying in bed between groups.  A: Support and encouragement provided to patient. Scheduled medications administered per MD orders. Maintain Q15 minute checks for safety.  R: Patient receptive. Denies SI/HI/AVH. Patient remains safe.

## 2012-12-27 LAB — COMPREHENSIVE METABOLIC PANEL
Alkaline Phosphatase: 88 U/L (ref 39–117)
BUN: 12 mg/dL (ref 6–23)
Chloride: 100 mEq/L (ref 96–112)
GFR calc Af Amer: >90 mL/min (ref >90)
GFR calc non Af Amer: >90 mL/min (ref >90)
Glucose, Bld: 245 mg/dL — ABNORMAL HIGH (ref 70–99)
Potassium: 5 mEq/L (ref 3.5–5.1)
Total Bilirubin: 0.3 mg/dL (ref 0.3–1.2)

## 2012-12-27 LAB — GLUCOSE, CAPILLARY: Glucose-Capillary: 255 mg/dL — ABNORMAL HIGH (ref 70–99)

## 2012-12-27 MED ORDER — BUPROPION HCL ER (XL) 150 MG PO TB24: 150.0000 mg | ORAL_TABLET | Freq: Every day | ORAL | Status: DC

## 2012-12-27 MED ORDER — INSULIN GLARGINE 100 UNIT/ML ~~LOC~~ SOLN: 30.0000 [IU] | Freq: Every day | SUBCUTANEOUS | Status: DC

## 2012-12-27 MED ORDER — POTASSIUM CHLORIDE ER 10 MEQ PO TBCR: 10.0000 meq | EXTENDED_RELEASE_TABLET | Freq: Every day | ORAL | Status: DC

## 2012-12-27 MED ORDER — METFORMIN HCL 1000 MG PO TABS: 500.0000 mg | ORAL_TABLET | Freq: Two times a day (BID) | ORAL | Status: DC

## 2012-12-27 MED ORDER — ASPIRIN 81 MG PO TBEC: 81.0000 mg | DELAYED_RELEASE_TABLET | Freq: Every day | ORAL | Status: DC

## 2012-12-27 MED ORDER — LISINOPRIL 10 MG PO TABS: 10.0000 mg | ORAL_TABLET | Freq: Every day | ORAL | Status: DC

## 2012-12-27 MED ORDER — TRAZODONE HCL 100 MG PO TABS: 100.0000 mg | ORAL_TABLET | Freq: Every day | ORAL | Status: AC

## 2012-12-27 MED ORDER — FUROSEMIDE 20 MG PO TABS
20.0000 mg | ORAL_TABLET | Freq: Every day | ORAL | Status: DC
  Administered 2012-12-28: 20 mg via ORAL
  Filled 2012-12-27 (×4): qty 1

## 2012-12-27 MED ORDER — SERTRALINE HCL 100 MG PO TABS: 100.0000 mg | ORAL_TABLET | Freq: Every day | ORAL | Status: DC

## 2012-12-27 MED ORDER — PRAVASTATIN SODIUM 10 MG PO TABS: 10.0000 mg | ORAL_TABLET | Freq: Every day | ORAL | Status: DC

## 2012-12-27 MED ORDER — FUROSEMIDE 40 MG PO TABS: 40.0000 mg | ORAL_TABLET | Freq: Every day | ORAL | Status: DC

## 2012-12-27 MED ORDER — TRAZODONE HCL 150 MG PO TABS
150.0000 mg | ORAL_TABLET | Freq: Every day | ORAL | Status: DC
  Administered 2012-12-27 – 2012-12-31 (×5): 150 mg via ORAL
  Filled 2012-12-27 (×6): qty 1

## 2012-12-27 MED ORDER — LISINOPRIL 5 MG PO TABS
5.0000 mg | ORAL_TABLET | Freq: Every day | ORAL | Status: DC
  Administered 2012-12-28: 5 mg via ORAL
  Filled 2012-12-27 (×4): qty 1

## 2012-12-27 NOTE — Progress Notes (Signed)
The focus of this group is to educate the patient on the purpose and policies of crisis stabilization and provide a format to answer questions about their admission.  The group details unit policies and expectations of patients while admitted.  Patient attended and participated in the group.  He was interactive and respectful of peers.

## 2012-12-27 NOTE — Progress Notes (Signed)
Recreation Therapy Notes  Date: 09.04.2014 Time: 2:30pm Location: 300 Hall Dayroom  Group Topic: Software engineer Activities (AAA)  Behavioral Response: Appropriate, Engaged, Attentive  Education: Coping Skill   Education Outcome: Acknowledges understanding  Clinical Observations/Feedback: Dog Team: [employer]. Patient interacted appropriately with peer, dog team, LRT and MHT.   Marykay Lex Joene Gelder, LRT/CTRS  Jearl Klinefelter 12/27/2012 4:39 PM

## 2012-12-27 NOTE — Progress Notes (Signed)
Adult Psychoeducational Group Note  Date:  12/27/2012 Time:  1:54 PM  Group Topic/Focus:  Building Self Esteem:   The Focus of this group is helping patients become aware of the effects of self-esteem on their lives, the things they and others do that enhance or undermine their self-esteem, seeing the relationship between their level of self-esteem and the choices they make and learning ways to enhance self-esteem.  Participation Level:  Active  Participation Quality:  Appropriate and Attentive  Affect:  Appropriate  Cognitive:  Alert and Appropriate  Insight: Good  Engagement in Group:  Engaged  Modes of Intervention:  Discussion, Socialization and Support  Additional Comments:  Pt came to group and shared that drugs and no faith in himself were effecting his self-esteem. Pt plans on changing this by going to AA meetings and loving himself and believing in his higher power.  Cathlean Cower 12/27/2012, 1:54 PM

## 2012-12-27 NOTE — Progress Notes (Signed)
Recreation Therapy Notes  Date: 09.04.2014 Time: 3:00pm Location: 300 [address] Dayroom   Group Topic: Coping Skills  Goal Area(s) Addresses:  Patient will identify things they are grateful for. Patient will identify how being grateful can influence decision making.   Behavioral Response: Appropriate, Attentive, Engaged, Active Listener  Education:  Coping Skills, Discharge Planning  Education Outcome: Acknowledges understanding  Clinical Observations/Feedback: Patient contributed to opening discussion, sharing that he is grateful being admitted to [facility]. Patient engaged in activity, choosing to share with group. Patient contributed to wrap up discussion identifying importance of recognizing things to be grateful for for group, as well as making connection between being grateful and good decision making.   Marykay Lex Ebone Alcivar, LRT/CTRS  Jearl Klinefelter 12/27/2012 4:29 PM

## 2012-12-27 NOTE — Progress Notes (Signed)
[facility] MD Progress Note  12/27/2012 2:52 PM [name]  MRN:  [id number] Subjective:  He continues to experience lightheadness. His blood pressure continues to be low. He was not taking his BP meds before he came here. He is also experiencing weakness. He did sleep some better last night. His appetite is still low. Diagnosis:   DSM5: Schizophrenia Disorders:   Obsessive-Compulsive Disorders:   Trauma-Stressor Disorders:   Substance/Addictive Disorders:  Alcohol Related Disorder - Severe (303.90), Cocaine related disorder-Severe Depressive Disorders:  Major Depressive Disorder - Moderate (296.22)  Axis I: Substance Induced Mood Disorder  ADL's:  Intact  Sleep: Fair  Appetite:  Poor  Suicidal Ideation:  Plan:  denies Intent:  denies Means:  denies Homicidal Ideation:  Plan:  denies Intent:  denies Means:  denies AEB (as evidenced by):  Psychiatric Specialty Exam: Review of Systems  HENT: Negative.   Eyes: Negative.   Respiratory: Negative.   Cardiovascular: Negative.   Gastrointestinal: Negative.   Genitourinary: Negative.   Musculoskeletal: Positive for myalgias and back pain.  Skin: Negative.   Neurological: Positive for dizziness and weakness.  Endo/Heme/Allergies: Negative.   Psychiatric/Behavioral: Positive for depression and substance abuse. The patient is nervous/anxious and has insomnia.     Blood pressure 86/59, pulse 80, temperature 97.5 F (36.4 C), temperature source Oral, resp. rate 18, height 6\' 3"  (1.905 m), weight 77.111 kg (170 lb).Body mass index is 21.25 kg/(m^2).  General Appearance: Disheveled  Eye Contact::  Minimal  Speech:  Clear and Coherent, Slow and not spontaneous  Volume:  Decreased  Mood:  Depressed and tired  Affect:  Restricted  Thought Process:  Coherent and Goal Directed  Orientation:  Full (Time, Place, and Person)  Thought Content:  limits to answer qustions, would not elaborate  Suicidal Thoughts:  No  Homicidal Thoughts:   No  Memory:  Immediate;   Fair Recent;   Poor Remote;   Fair  Judgement:  Fair  Insight:  Present and Shallow  Psychomotor Activity:  Psychomotor Retardation  Concentration:  Fair  Recall:  Fair  Akathisia:  No  Handed:  Right  AIMS (if indicated):     Assets:  Desire for Improvement  Sleep:  Number of Hours: 6.75   Current Medications: Current Facility-Administered Medications  Medication Dose Route Frequency Provider Last Rate Last Dose  . acetaminophen (TYLENOL) tablet 650 mg  650 mg Oral Q6H PRN Nelly Rout, MD      . alum & mag hydroxide-simeth (MAALOX/MYLANTA) 200-200-20 MG/5ML suspension 30 mL  30 mL Oral Q4H PRN Nelly Rout, MD      . aspirin EC tablet 81 mg  81 mg Oral Daily Sanjuana Kava, NP   81 mg at 12/27/12 0835  . buPROPion (WELLBUTRIN XL) 24 hr tablet 150 mg  150 mg Oral Daily Rachael Fee, MD   150 mg at 12/27/12 0834  . feeding supplement (GLUCERNA SHAKE) liquid 237 mL  237 mL Oral BID BM Rachael Fee, MD   237 mL at 12/26/12 1011  . [START ON 12/28/2012] furosemide (LASIX) tablet 20 mg  20 mg Oral Daily Sanjuana Kava, NP      . insulin aspart (novoLOG) injection 0-15 Units  0-15 Units Subcutaneous TID WC Nelly Rout, MD   5 Units at 12/27/12 1159  . insulin aspart (novoLOG) injection 0-5 Units  0-5 Units Subcutaneous QHS Nelly Rout, MD   2 Units at 12/26/12 2205  . insulin glargine (LANTUS) injection 30 Units  30  Units Subcutaneous QHS Sanjuana Kava, NP   30 Units at 12/26/12 2206  . [START ON 12/28/2012] lisinopril (PRINIVIL,ZESTRIL) tablet 5 mg  5 mg Oral Daily Sanjuana Kava, NP      . magnesium hydroxide (MILK OF MAGNESIA) suspension 30 mL  30 mL Oral Daily PRN Rachael Fee, MD      . metFORMIN (GLUCOPHAGE) tablet 500 mg  500 mg Oral BID WC Sanjuana Kava, NP   500 mg at 12/27/12 0834  . multivitamin with minerals tablet 1 tablet  1 tablet Oral Daily Nelly Rout, MD   1 tablet at 12/27/12 0835  . potassium chloride (K-DUR) CR tablet 10 mEq  10 mEq Oral  Daily Sanjuana Kava, NP   10 mEq at 12/27/12 0834  . sertraline (ZOLOFT) tablet 100 mg  100 mg Oral QHS Sanjuana Kava, NP   100 mg at 12/26/12 2209  . simvastatin (ZOCOR) tablet 5 mg  5 mg Oral q1800 Sanjuana Kava, NP   5 mg at 12/26/12 1703  . thiamine (VITAMIN B-1) tablet 100 mg  100 mg Oral Daily Nelly Rout, MD   100 mg at 12/27/12 0835  . traZODone (DESYREL) tablet 150 mg  150 mg Oral QHS Rachael Fee, MD        Lab Results:  Results for orders placed during the hospital encounter of 12/20/12 (from the past 48 hour(s))  GLUCOSE, CAPILLARY     Status: Abnormal   Collection Time    12/25/12  4:53 PM      Result Value Range   Glucose-Capillary 148 (*) 70 - 99 mg/dL  GLUCOSE, CAPILLARY     Status: Abnormal   Collection Time    12/25/12  8:51 PM      Result Value Range   Glucose-Capillary 272 (*) 70 - 99 mg/dL  GLUCOSE, CAPILLARY     Status: Abnormal   Collection Time    12/26/12  6:33 AM      Result Value Range   Glucose-Capillary 135 (*) 70 - 99 mg/dL  GLUCOSE, CAPILLARY     Status: Abnormal   Collection Time    12/26/12 11:36 AM      Result Value Range   Glucose-Capillary 221 (*) 70 - 99 mg/dL  GLUCOSE, CAPILLARY     Status: Abnormal   Collection Time    12/26/12  4:57 PM      Result Value Range   Glucose-Capillary 196 (*) 70 - 99 mg/dL  GLUCOSE, CAPILLARY     Status: Abnormal   Collection Time    12/26/12  9:35 PM      Result Value Range   Glucose-Capillary 212 (*) 70 - 99 mg/dL  GLUCOSE, CAPILLARY     Status: Abnormal   Collection Time    12/27/12  6:26 AM      Result Value Range   Glucose-Capillary 150 (*) 70 - 99 mg/dL  GLUCOSE, CAPILLARY     Status: Abnormal   Collection Time    12/27/12 11:38 AM      Result Value Range   Glucose-Capillary 222 (*) 70 - 99 mg/dL    Physical Findings: AIMS: Facial and Oral Movements Muscles of Facial Expression: None, normal Lips and Perioral Area: None, normal Jaw: None, normal Tongue: None, normal,Extremity  Movements Upper (arms, wrists, hands, fingers): None, normal Lower (legs, knees, ankles, toes): None, normal, Trunk Movements Neck, shoulders, hips: None, normal, Overall Severity Severity of abnormal movements (highest score from questions above):  None, normal Incapacitation due to abnormal movements: None, normal Patient's awareness of abnormal movements (rate only patient's report): No Awareness, Dental Status Current problems with teeth and/or dentures?: No Does patient usually wear dentures?: No  CIWA:  CIWA-Ar Total: 0 COWS:     Treatment Plan Summary: Daily contact with patient to assess and evaluate symptoms and progress in treatment Medication management  Plan: Supportive approach/coping skills/relapse prevention           Decrease the Lasix and the Lisinopril           Reassess BP as we go on           Will repeat the CMET (check liver status, electrolytes)  Medical Decision Making Problem Points:  Review of psycho-social stressors (1) Data Points:  Review of medication regiment & side effects (2)  I certify that inpatient services furnished can reasonably be expected to improve the patient's condition.   Niti Leisure A 12/27/2012, 2:52 PM

## 2012-12-27 NOTE — Progress Notes (Signed)
D:  Patient up and active in the milieu today.  Continues to complain of feeling light-headed.  Blood pressures continue to run low.  He denies suicidal ideation.   A:  Medications given as prescribed.  Encouraged participation in groups.  Discussed plan of care with MD.  Safety maintained with 15 minute checks.   R:  Cooperative with staff.  Out of room more today.  Interacting more in groups and with peers today.  Safety is maintained.

## 2012-12-27 NOTE — [facility] Group Notes (Signed)
[facility] LCSW Group Therapy  12/27/2012 2:53 PM  Type of Therapy:  Group Therapy  Participation Level:  Active  Participation Quality:  Attentive  Affect:  Lethargic  Cognitive:  Alert  Insight:  Improving  Engagement in Therapy:  Limited  Modes of Intervention:  Discussion, Education, Exploration, Socialization and Support  Summary of Progress/Problems:  Finding Balance in Life. Today's group focused on defining balance in one's own words, identifying things that can knock one off balance, and exploring healthy ways to maintain balance in life. Group members were asked to provide an example of a time when they felt off balance, describe how they handled that situation,and process healthier ways to regain balance in the future. Group members were asked to share the most important tool for maintaining balance that they learned while at [facility] and how they plan to apply this method after discharge. [name] was attentive and engaged throughout today's group but mentioned that he felt extremely tired. [name] stated that for him, the biggest challenge to staying in balance is "hanging out with certain people that drink and use drugs, and being in the wrong environments." [name] identified "going to longer term treatment, quitting smoking on top of everything else, eating and sleeping regularly, and taking my medications" as ways to reestablish a sense of balance in life after he leaves [facility]. [name] shows progress in the group setting AEB his ability to actively participate and encourage others in the group. He shows improving insight AEB his ability to identify triggers and stressors that lead to imbalance in addition to ways to reestablish a sense of balance in life.    Smart, [name] 12/27/2012, 2:53 PM

## 2012-12-28 LAB — GLUCOSE, CAPILLARY
Glucose-Capillary: 201 mg/dL — ABNORMAL HIGH (ref 70–99)
Glucose-Capillary: 171 mg/dL — ABNORMAL HIGH (ref 70–99)

## 2012-12-28 NOTE — Progress Notes (Addendum)
Pt. Had no complaints of pain or discomfort but stated he is having problems with his legs when ambulating and described it as weakness. The MD is aware of this.  Pt. Was animated and appeared to be enjoying attention from his peers.  Pt. Walked to the med room later with no issues noted.  Pt.'s BP was 143/89 with a pulse of 80 this pm.  Pt. Reports that his longest time sober was six years and he is concerned if he will be able to maintain sobriety after discharge.  Pt. Has lost his car and has legal charges for possession.   Pt. Denies SI and HI, denies A or VH. Pt. 's CBG was 171 requiring only Lantus .

## 2012-12-28 NOTE — Progress Notes (Signed)
Adult Psychoeducational Group Note  Date:  12/28/2012 Time:  1:32 PM  Group Topic/Focus:  Relapse Prevention Planning:   The focus of this group is to define relapse and discuss the need for planning to combat relapse.  Participation Level:  Active  Participation Quality:  Drowsy  Affect:  Lethargic  Cognitive:  Appropriate  Insight: Appropriate  Engagement in Group:  Engaged  Modes of Intervention:  Activity and Discussion  Additional Comments:  Pt actively participated in group activity and discussion.   Roanna Banning, [place] N 12/28/2012, 1:32 PM

## 2012-12-28 NOTE — Progress Notes (Signed)
Patient ID: [name], male   DOB: 13-Mar-1956, 57 y.o.   MRN: [id number] D: Pt. In bed eyes closed, resp. Even, unlabored. A: Writer assessed for safety and signs of distress. R: Pt. Is safe on the unit, no distress noted.

## 2012-12-28 NOTE — Progress Notes (Signed)
Patient ID: [name], male   DOB: 07/01/1955, 57 y.o.   MRN: [id number] PER STATE REGULATIONS 482.30  THIS CHART WAS REVIEWED FOR MEDICAL NECESSITY WITH RESPECT TO THE PATIENT'S ADMISSION/ DURATION OF STAY.  NEXT REVIEW DATE: 01/01/2013  [name], RN, BSN CASE MANAGER

## 2012-12-28 NOTE — Tx Team (Signed)
Interdisciplinary Treatment Plan Update (Adult)  Date: 12/28/2012   Time Reviewed: 10:52 AM  Progress in Treatment:  Attending groups: Intermittently  Participating in groups:  Minimally  Taking medication as prescribed: Yes  Tolerating medication: Yes  Family/Significant othe contact made: Pt refused to consent to family contact. SPE completed with pt.  Patient understands diagnosis: Yes, AEB seeking treatment for SI, depression, substance abuse, and ETOH detox.  Discussing patient identified problems/goals with staff: Yes  Medical problems stabilized or resolved: Yes  Denies suicidal/homicidal ideation: Yes, self report.  Patient has not harmed self or Others: Yes  New problem(s) identified: Pt currently not attending d/c planning group and is minimally participating in daily groups. Discharge Plan or Barriers: [facility] referral sent. No beds available today. Pt has [facility] admission date of Tues. 9/9.  Reason for Continuation of Hospitalization: Mood stabilization Medication management Medical stabilization Estimated length of stay: 2-3 days (likely discharge on Monday)  For review of initial/current patient goals, please see plan of care.  Attendees:  Patient:    Family:    Physician: Geoffery Lyons MD 12/28/2012 10:52 AM   Nursing: Griffin Dakin RN  12/28/2012 10:52 AM   Clinical Social Worker [employer], LCSWA  12/28/2012 10:52 AM   Other: Aggie PA 12/28/2012 10:52 AM   Other:    Other: Darden Dates Nurse CM 12/28/2012 10:52 AM   Other:    Scribe for Treatment Team:  [employer] LCSWA 12/28/2012 10:52 AM

## 2012-12-28 NOTE — [facility] Group Notes (Signed)
[facility] LCSW Aftercare Discharge Planning Group Note   12/28/2012 9:20 AM  Participation Quality:  DID NOT ATTEND   Smart, Herbert Seta

## 2012-12-28 NOTE — Progress Notes (Signed)
D   Pt is appropriate and pleasant   He attends and participates in groups   He denies suicidal ideation at present A   Verbal support given  Medications administered and effectiveness monitored   Q 15 min checks R   Pt safe at present

## 2012-12-28 NOTE — [facility] Group Notes (Signed)
[facility] LCSW Group Therapy  12/28/2012 2:18 PM  Type of Therapy:  Group Therapy  Participation Level:  Did Not Attend  [name], Thomas Gause 12/28/2012, 2:18 PM

## 2012-12-28 NOTE — Progress Notes (Signed)
[facility] Group Notes:  (Nursing/MHT/Case Management/Adjunct)  Date:  12/27/2012  Time:  2100  Type of Therapy:  wrap up group  Participation Level:  Active  Participation Quality:  Appropriate, Attentive, Sharing and Supportive  Affect:  Flat  Cognitive:  Appropriate  Insight:  Appropriate  Engagement in Group:  Engaged  Modes of Intervention:  Clarification, Education and Support  Summary of Progress/Problems:  [name] 12/28/2012, 1:20 AM

## 2012-12-28 NOTE — Progress Notes (Signed)
[facility] MD Progress Note  12/28/2012 2:47 PM [name]  MRN:  [id number] Subjective:  Still not feeling well. He feels weak specially feels that his legs are weak and he is having a hard time ambulating. Does not feel he can fall. His BP is still running low. He still does not have much support out there. Worried about relapsing if he was not in structured place. He states he will not be able to make it if he was to relapse again. CMET shows improvement in the liver enzymes. Still borderline Na. Diagnosis:   DSM5: Schizophrenia Disorders:   Obsessive-Compulsive Disorders:   Trauma-Stressor Disorders:   Substance/Addictive Disorders:  Alcohol Related Disorder - Severe (303.90), Cocaine Related Disorders Depressive Disorders:  Major Depressive Disorder - Moderate (296.22)  Axis I: Depressive Disorder NOS and Substance Induced Mood Disorder  ADL's:  Intact  Sleep: Poor  Appetite:  Poor  Suicidal Ideation:  Plan:  denies Intent:  denies Means:  denies Homicidal Ideation:  Plan:  denies Intent:  denies Means:  denies AEB (as evidenced by):  Psychiatric Specialty Exam: Review of Systems  Constitutional: Positive for malaise/fatigue.  HENT: Negative.   Eyes: Negative.   Respiratory: Negative.   Cardiovascular: Negative.   Gastrointestinal: Negative.   Genitourinary: Negative.   Musculoskeletal: Positive for back pain.       "legs are weak"  Skin: Negative.   Neurological: Positive for dizziness and weakness.  Endo/Heme/Allergies: Negative.   Psychiatric/Behavioral: Positive for depression and substance abuse. The patient is nervous/anxious and has insomnia.     Blood pressure 94/60, pulse 90, temperature 97.5 F (36.4 C), temperature source Oral, resp. rate 18, height 6\' 3"  (1.905 m), weight 77.111 kg (170 lb).Body mass index is 21.25 kg/(m^2).  General Appearance: Fairly Groomed  Patent attorney::  Minimal  Speech:  Clear and Coherent and Slow  Volume:  Decreased  Mood:   Anxious and Depressed  Affect:  Restricted  Thought Process:  Coherent and Goal Directed  Orientation:  Full (Time, Place, and Person)  Thought Content:  worries, concerns, symptoms  Suicidal Thoughts:  No  Homicidal Thoughts:  No  Memory:  Immediate;   Fair Recent;   Fair Remote;   Fair  Judgement:  Fair  Insight:  Shallow  Psychomotor Activity:  Psychomotor Retardation  Concentration:  Fair  Recall:  Fair  Akathisia:  No  Handed:  Right  AIMS (if indicated):     Assets:  Desire for Improvement  Sleep:  Number of Hours: 6.75   Current Medications: Current Facility-Administered Medications  Medication Dose Route Frequency Provider Last Rate Last Dose  . acetaminophen (TYLENOL) tablet 650 mg  650 mg Oral Q6H PRN Nelly Rout, MD      . alum & mag hydroxide-simeth (MAALOX/MYLANTA) 200-200-20 MG/5ML suspension 30 mL  30 mL Oral Q4H PRN Nelly Rout, MD      . aspirin EC tablet 81 mg  81 mg Oral Daily Sanjuana Kava, NP   81 mg at 12/28/12 4098  . buPROPion (WELLBUTRIN XL) 24 hr tablet 150 mg  150 mg Oral Daily Rachael Fee, MD   150 mg at 12/28/12 0815  . feeding supplement (GLUCERNA SHAKE) liquid 237 mL  237 mL Oral BID BM Rachael Fee, MD   237 mL at 12/26/12 1011  . furosemide (LASIX) tablet 20 mg  20 mg Oral Daily Sanjuana Kava, NP   20 mg at 12/28/12 1191  . insulin aspart (novoLOG) injection 0-15 Units  0-15 Units Subcutaneous TID WC Nelly Rout, MD   5 Units at 12/28/12 1213  . insulin aspart (novoLOG) injection 0-5 Units  0-5 Units Subcutaneous QHS Nelly Rout, MD   2 Units at 12/27/12 2147  . insulin glargine (LANTUS) injection 30 Units  30 Units Subcutaneous QHS Sanjuana Kava, NP   30 Units at 12/27/12 2146  . lisinopril (PRINIVIL,ZESTRIL) tablet 5 mg  5 mg Oral Daily Sanjuana Kava, NP   5 mg at 12/28/12 1610  . magnesium hydroxide (MILK OF MAGNESIA) suspension 30 mL  30 mL Oral Daily PRN Rachael Fee, MD      . metFORMIN (GLUCOPHAGE) tablet 500 mg  500 mg Oral BID  WC Sanjuana Kava, NP   500 mg at 12/28/12 9604  . multivitamin with minerals tablet 1 tablet  1 tablet Oral Daily Nelly Rout, MD   1 tablet at 12/28/12 [phone]  . potassium chloride (K-DUR) CR tablet 10 mEq  10 mEq Oral Daily Sanjuana Kava, NP   10 mEq at 12/28/12 8119  . sertraline (ZOLOFT) tablet 100 mg  100 mg Oral QHS Sanjuana Kava, NP   100 mg at 12/27/12 2144  . simvastatin (ZOCOR) tablet 5 mg  5 mg Oral q1800 Sanjuana Kava, NP   5 mg at 12/27/12 1815  . thiamine (VITAMIN B-1) tablet 100 mg  100 mg Oral Daily Nelly Rout, MD   100 mg at 12/28/12 1478  . traZODone (DESYREL) tablet 150 mg  150 mg Oral QHS Rachael Fee, MD   150 mg at 12/27/12 2144    Lab Results:  Results for orders placed during the hospital encounter of 12/20/12 (from the past 48 hour(s))  GLUCOSE, CAPILLARY     Status: Abnormal   Collection Time    12/26/12  4:57 PM      Result Value Range   Glucose-Capillary 196 (*) 70 - 99 mg/dL  GLUCOSE, CAPILLARY     Status: Abnormal   Collection Time    12/26/12  9:35 PM      Result Value Range   Glucose-Capillary 212 (*) 70 - 99 mg/dL  GLUCOSE, CAPILLARY     Status: Abnormal   Collection Time    12/27/12  6:26 AM      Result Value Range   Glucose-Capillary 150 (*) 70 - 99 mg/dL  GLUCOSE, CAPILLARY     Status: Abnormal   Collection Time    12/27/12 11:38 AM      Result Value Range   Glucose-Capillary 222 (*) 70 - 99 mg/dL  GLUCOSE, CAPILLARY     Status: Abnormal   Collection Time    12/27/12  4:58 PM      Result Value Range   Glucose-Capillary 255 (*) 70 - 99 mg/dL  COMPREHENSIVE METABOLIC PANEL     Status: Abnormal   Collection Time    12/27/12  7:58 PM      Result Value Range   Sodium 138  135 - 145 mEq/L   Potassium 5.0  3.5 - 5.1 mEq/L   Chloride 100  96 - 112 mEq/L   CO2 29  19 - 32 mEq/L   Glucose, Bld 245 (*) 70 - 99 mg/dL   BUN 12  6 - 23 mg/dL   Creatinine, Ser 2.95  0.50 - 1.35 mg/dL   Calcium 9.4  8.4 - 62.1 mg/dL   Total Protein 7.1  6.0 -  8.3 g/dL   Albumin 3.2 (*) 3.5 -  5.2 g/dL   AST 55 (*) 0 - 37 U/L   ALT 56 (*) 0 - 53 U/L   Alkaline Phosphatase 88  39 - 117 U/L   Total Bilirubin 0.3  0.3 - 1.2 mg/dL   GFR calc non Af Amer >90  >90 mL/min   GFR calc Af Amer >90  >90 mL/min   Comment: (NOTE)     The eGFR has been calculated using the CKD EPI equation.     This calculation has not been validated in all clinical situations.     eGFR's persistently <90 mL/min signify possible Chronic Kidney     Disease.     Performed at [facility]  GLUCOSE, CAPILLARY     Status: Abnormal   Collection Time    12/27/12  9:07 PM      Result Value Range   Glucose-Capillary 236 (*) 70 - 99 mg/dL   Comment 1 Notify RN     Comment 2 Documented in Chart    GLUCOSE, CAPILLARY     Status: Abnormal   Collection Time    12/28/12  6:04 AM      Result Value Range   Glucose-Capillary 143 (*) 70 - 99 mg/dL   Comment 1 Notify RN     Comment 2 Documented in Chart    GLUCOSE, CAPILLARY     Status: Abnormal   Collection Time    12/28/12 11:53 AM      Result Value Range   Glucose-Capillary 201 (*) 70 - 99 mg/dL   Comment 1 Notify RN      Physical Findings: AIMS: Facial and Oral Movements Muscles of Facial Expression: None, normal Lips and Perioral Area: None, normal Jaw: None, normal Tongue: None, normal,Extremity Movements Upper (arms, wrists, hands, fingers): None, normal Lower (legs, knees, ankles, toes): None, normal, Trunk Movements Neck, shoulders, hips: None, normal, Overall Severity Severity of abnormal movements (highest score from questions above): None, normal Incapacitation due to abnormal movements: None, normal Patient's awareness of abnormal movements (rate only patient's report): No Awareness, Dental Status Current problems with teeth and/or dentures?: No Does patient usually wear dentures?: No  CIWA:  CIWA-Ar Total: 0 COWS:     Treatment Plan Summary: Daily contact with patient to assess and  evaluate symptoms and progress in treatment Medication management  Plan: Supportive approach/coping skills/relapse prevention           Continue to monitor his BP           Continue Wellbutrin/Zoloft  Medical Decision Making Problem Points:  Review of psycho-social stressors (1) Data Points:  Review of medication regiment & side effects (2)  I certify that inpatient services furnished can reasonably be expected to improve the patient's condition.   Joneisha Miles A 12/28/2012, 2:47 PM

## 2012-12-29 LAB — GLUCOSE, CAPILLARY
Glucose-Capillary: 219 mg/dL — ABNORMAL HIGH (ref 70–99)
Glucose-Capillary: 184 mg/dL — ABNORMAL HIGH (ref 70–99)

## 2012-12-29 NOTE — Progress Notes (Signed)
D.  Pt pleasant on approach, denies complaints.  Positive for evening wrap up group.  Interacting appropriately within milieu.  Denies SI/HI/hallucinations at this time.  A.  Support and encouragement offered  R.   Pt remains safe on unit, will continue to monitor.

## 2012-12-29 NOTE — [facility] Group Notes (Signed)
[facility] Group Notes:  (Nursing/MHT/Case Management/Adjunct)  Date:  12/29/2012  Time:  4:04 PM  Type of Therapy:  Psychoeducational Skills  Participation Level:  Did Not Attend    [name] 12/29/2012, 4:04 PM

## 2012-12-29 NOTE — Progress Notes (Signed)
Patient ID: NIRAV SWEDA, male   DOB: 1955-08-28, 57 y.o.   MRN: [id number]  D: Pt has been very flat and depressed on the unit. Pt has also complained of being dizzy, he has been running low blood pressures, he refused his morning dose of lasix and lisinopril. Pt has not gone to group and has not engaged in treatment. Pt reported being negative SI/HI, no AH/VH noted. A: 15 minute checks continued for patient safety. R: Pts safety maintained.

## 2012-12-29 NOTE — Progress Notes (Signed)
Patient ID: [name], male   DOB: [birth year], 57 y.o.   MRN: [id number] [facility] MD Progress Note  12/29/2012 2:52 PM [name]  MRN:  [id number]  Subjective:  Bobbi is endorsing that is head is feeling dizzy and his legs weak today. He rated his depression at #6. He denies any SIHI, AVH. Currently taking a break in his bed.  Diagnosis:   DSM5: Schizophrenia Disorders:   Obsessive-Compulsive Disorders:   Trauma-Stressor Disorders:   Substance/Addictive Disorders:  Alcohol Related Disorder - Severe (303.90), Cocaine Related Disorders Depressive Disorders:  Major Depressive Disorder - Moderate (296.22)  Axis I: Depressive Disorder NOS and Substance Induced Mood Disorder  ADL's:  Intact  Sleep: Poor  Appetite:  Poor  Suicidal Ideation:  Plan:  denies Intent:  denies Means:  denies Homicidal Ideation:  Plan:  denies Intent:  denies Means:  denies AEB (as evidenced by):  Psychiatric Specialty Exam: Review of Systems  Constitutional: Positive for malaise/fatigue.  HENT: Negative.   Eyes: Negative.   Respiratory: Negative.   Cardiovascular: Negative.   Gastrointestinal: Negative.   Genitourinary: Negative.   Musculoskeletal: Positive for back pain.       "legs are weak"  Skin: Negative.   Neurological: Positive for dizziness and weakness.  Endo/Heme/Allergies: Negative.   Psychiatric/Behavioral: Positive for depression and substance abuse. The patient is nervous/anxious and has insomnia.     Blood pressure 91/62, pulse 83, temperature 97.5 F (36.4 C), temperature source Oral, resp. rate 19, height 6\' 3"  (1.905 m), weight 77.111 kg (170 lb).Body mass index is 21.25 kg/(m^2).  General Appearance: Fairly Groomed  Patent attorney::  Minimal  Speech:  Clear and Coherent and Slow  Volume:  Decreased  Mood:  Anxious and Depressed  Affect:  Restricted  Thought Process:  Coherent and Goal Directed  Orientation:  Full (Time, Place, and Person)  Thought Content:   worries, concerns, symptoms  Suicidal Thoughts:  No  Homicidal Thoughts:  No  Memory:  Immediate;   Fair Recent;   Fair Remote;   Fair  Judgement:  Fair  Insight:  Shallow  Psychomotor Activity:  Psychomotor Retardation  Concentration:  Fair  Recall:  Fair  Akathisia:  No  Handed:  Right  AIMS (if indicated):     Assets:  Desire for Improvement  Sleep:  Number of Hours: 6.75   Current Medications: Current Facility-Administered Medications  Medication Dose Route Frequency Provider Last Rate Last Dose  . acetaminophen (TYLENOL) tablet 650 mg  650 mg Oral Q6H PRN Nelly Rout, MD      . alum & mag hydroxide-simeth (MAALOX/MYLANTA) 200-200-20 MG/5ML suspension 30 mL  30 mL Oral Q4H PRN Nelly Rout, MD      . aspirin EC tablet 81 mg  81 mg Oral Daily Sanjuana Kava, NP   81 mg at 12/29/12 0755  . buPROPion (WELLBUTRIN XL) 24 hr tablet 150 mg  150 mg Oral Daily Rachael Fee, MD   150 mg at 12/29/12 0755  . feeding supplement (GLUCERNA SHAKE) liquid 237 mL  237 mL Oral BID BM Rachael Fee, MD   237 mL at 12/29/12 1206  . furosemide (LASIX) tablet 20 mg  20 mg Oral Daily Sanjuana Kava, NP   20 mg at 12/28/12 6295  . insulin aspart (novoLOG) injection 0-15 Units  0-15 Units Subcutaneous TID WC Nelly Rout, MD   5 Units at 12/29/12 1205  . insulin aspart (novoLOG) injection 0-5 Units  0-5 Units Subcutaneous QHS Archana  Lucianne Muss, MD   2 Units at 12/27/12 2147  . insulin glargine (LANTUS) injection 30 Units  30 Units Subcutaneous QHS Sanjuana Kava, NP   30 Units at 12/28/12 2137  . lisinopril (PRINIVIL,ZESTRIL) tablet 5 mg  5 mg Oral Daily Sanjuana Kava, NP   5 mg at 12/28/12 3086  . magnesium hydroxide (MILK OF MAGNESIA) suspension 30 mL  30 mL Oral Daily PRN Rachael Fee, MD      . metFORMIN (GLUCOPHAGE) tablet 500 mg  500 mg Oral BID WC Sanjuana Kava, NP   500 mg at 12/29/12 0755  . multivitamin with minerals tablet 1 tablet  1 tablet Oral Daily Nelly Rout, MD   1 tablet at 12/29/12  0755  . potassium chloride (K-DUR) CR tablet 10 mEq  10 mEq Oral Daily Sanjuana Kava, NP   10 mEq at 12/29/12 0755  . sertraline (ZOLOFT) tablet 100 mg  100 mg Oral QHS Sanjuana Kava, NP   100 mg at 12/28/12 2136  . simvastatin (ZOCOR) tablet 5 mg  5 mg Oral q1800 Sanjuana Kava, NP   5 mg at 12/27/12 1815  . thiamine (VITAMIN B-1) tablet 100 mg  100 mg Oral Daily Nelly Rout, MD   100 mg at 12/29/12 0755  . traZODone (DESYREL) tablet 150 mg  150 mg Oral QHS Rachael Fee, MD   150 mg at 12/28/12 2137    Lab Results:  Results for orders placed during the hospital encounter of 12/20/12 (from the past 48 hour(s))  GLUCOSE, CAPILLARY     Status: Abnormal   Collection Time    12/27/12  4:58 PM      Result Value Range   Glucose-Capillary 255 (*) 70 - 99 mg/dL  COMPREHENSIVE METABOLIC PANEL     Status: Abnormal   Collection Time    12/27/12  7:58 PM      Result Value Range   Sodium 138  135 - 145 mEq/L   Potassium 5.0  3.5 - 5.1 mEq/L   Chloride 100  96 - 112 mEq/L   CO2 29  19 - 32 mEq/L   Glucose, Bld 245 (*) 70 - 99 mg/dL   BUN 12  6 - 23 mg/dL   Creatinine, Ser 5.78  0.50 - 1.35 mg/dL   Calcium 9.4  8.4 - 46.9 mg/dL   Total Protein 7.1  6.0 - 8.3 g/dL   Albumin 3.2 (*) 3.5 - 5.2 g/dL   AST 55 (*) 0 - 37 U/L   ALT 56 (*) 0 - 53 U/L   Alkaline Phosphatase 88  39 - 117 U/L   Total Bilirubin 0.3  0.3 - 1.2 mg/dL   GFR calc non Af Amer >90  >90 mL/min   GFR calc Af Amer >90  >90 mL/min   Comment: (NOTE)     The eGFR has been calculated using the CKD EPI equation.     This calculation has not been validated in all clinical situations.     eGFR's persistently <90 mL/min signify possible Chronic Kidney     Disease.     Performed at [facility]  GLUCOSE, CAPILLARY     Status: Abnormal   Collection Time    12/27/12  9:07 PM      Result Value Range   Glucose-Capillary 236 (*) 70 - 99 mg/dL   Comment 1 Notify RN     Comment 2 Documented in Chart    GLUCOSE,  CAPILLARY     Status: Abnormal   Collection Time    12/28/12  6:04 AM      Result Value Range   Glucose-Capillary 143 (*) 70 - 99 mg/dL   Comment 1 Notify RN     Comment 2 Documented in Chart    GLUCOSE, CAPILLARY     Status: Abnormal   Collection Time    12/28/12 11:53 AM      Result Value Range   Glucose-Capillary 201 (*) 70 - 99 mg/dL   Comment 1 Notify RN    GLUCOSE, CAPILLARY     Status: Abnormal   Collection Time    12/28/12  5:00 PM      Result Value Range   Glucose-Capillary 233 (*) 70 - 99 mg/dL   Comment 1 Notify RN    GLUCOSE, CAPILLARY     Status: Abnormal   Collection Time    12/28/12  9:09 PM      Result Value Range   Glucose-Capillary 171 (*) 70 - 99 mg/dL  GLUCOSE, CAPILLARY     Status: Abnormal   Collection Time    12/29/12  6:07 AM      Result Value Range   Glucose-Capillary 190 (*) 70 - 99 mg/dL   Comment 1 Notify RN     Comment 2 Documented in Chart    GLUCOSE, CAPILLARY     Status: Abnormal   Collection Time    12/29/12 11:59 AM      Result Value Range   Glucose-Capillary 219 (*) 70 - 99 mg/dL   Comment 1 Notify RN      Physical Findings: AIMS: Facial and Oral Movements Muscles of Facial Expression: None, normal Lips and Perioral Area: None, normal Jaw: None, normal Tongue: None, normal,Extremity Movements Upper (arms, wrists, hands, fingers): None, normal Lower (legs, knees, ankles, toes): None, normal, Trunk Movements Neck, shoulders, hips: None, normal, Overall Severity Severity of abnormal movements (highest score from questions above): None, normal Incapacitation due to abnormal movements: None, normal Patient's awareness of abnormal movements (rate only patient's report): No Awareness, Dental Status Current problems with teeth and/or dentures?: No Does patient usually wear dentures?: No  CIWA:  CIWA-Ar Total: 0 COWS:     Treatment Plan Summary: Daily contact with patient to assess and evaluate symptoms and progress in  treatment Medication management  Plan: Supportive approach/coping skills/relapse prevention  Continue to monitor his BP, is showing some improvedment Continue Wellbutrin/Zoloft for depression. Encourage group participation  Medical Decision Making Problem Points:  Review of psycho-social stressors (1) Data Points:  Review of medication regiment & side effects (2)  I certify that inpatient services furnished can reasonably be expected to improve the patient's condition.   Armandina Stammer I, PMHNP-BC 12/29/2012, 2:52 PM

## 2012-12-29 NOTE — Progress Notes (Signed)
Psychoeducational Group Note  Date:  12/29/2012 Time:  0945 am  Group Topic/Focus:  Identifying Needs:   The focus of this group is to help patients identify their personal needs that have been historically problematic and identify healthy behaviors to address their needs.  Participation Level:  Did Not Attend  Andrena Mews 12/29/2012,9:58 AM

## 2012-12-29 NOTE — [facility] Group Notes (Signed)
[facility] Group Notes: (Clinical Social Work)   12/29/2012      Type of Therapy:  Group Therapy   Participation Level:  Did Not Attend    Ambrose Mantle, LCSW 12/29/2012, 1:11 PM

## 2012-12-30 LAB — GLUCOSE, CAPILLARY: Glucose-Capillary: 225 mg/dL — ABNORMAL HIGH (ref 70–99)

## 2012-12-30 MED ORDER — FUROSEMIDE 20 MG PO TABS
10.0000 mg | ORAL_TABLET | Freq: Every day | ORAL | Status: DC
  Administered 2012-12-31: 10 mg via ORAL
  Filled 2012-12-30 (×3): qty 0.5

## 2012-12-30 NOTE — Progress Notes (Deleted)
Patient ID: [name], male   DOB: [birth year], 57 y.o.   MRN: [id number]  Pt was discharged home with his wife, pt reported that he was ready for discharge. At time of discharge pt reported that he had already signed his release of information with his case worker throughout the week. Pt also requested that this writer discard his two home meds, zoloft and xanax, due to the fact that he had overdosed on it. This Clinical research associate got a witness from [employer], zoloft was discarded no medication was in the Xanax bottle. Pt reported being negative SI/HI, no AH/VH noted.

## 2012-12-30 NOTE — Progress Notes (Signed)
D.  Pt with flat affect but brightens on approach.  Still endorses dizzy and lightheaded at times.  Positive for evening AA group, interacting appropriately on unit.  Denies SI/HI/hallucinations at this time.  A.  Support and encouragement offered  R.  Pt remains safe on unit, will continue to monitor.

## 2012-12-30 NOTE — [facility] Group Notes (Signed)
[facility] Group Notes:  (Clinical Social Work)  12/30/2012  10:00-11:00AM  Summary of Progress/Problems:   The main focus of today's process group was to   identify the patient's current support system and decide on other supports that can be put in place.  The picture on workbook was used to discuss why additional supports are needed, and a hand-out was distributed with four definitions/levels of support, then used to talk about how patients have given and received all different kinds of support.  An emphasis was placed on using counselor, doctor, therapy groups, 12-step groups, and problem-specific support groups to expand supports.  The patient identified his mother, sister, and God as his supports currently.  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  He was very engaged and expressed that his views on support were broadened.  Type of Therapy:  Process Group with Motivational Interviewing  Participation Level:  Active  Participation Quality:  Attentive, Sharing and Supportive  Affect:  Blunted and Depressed  Cognitive:  Alert, Appropriate and Oriented  Insight:  Engaged  Engagement in Therapy:  Engaged  Modes of Intervention:   Education, Support and Processing, Activity  [employer], LCSW 12/30/2012, 12:59 PM

## 2012-12-30 NOTE — Progress Notes (Signed)
Attended AA 

## 2012-12-30 NOTE — Progress Notes (Signed)
[facility] Group Notes:  (Nursing/MHT/Case Management/Adjunct)  Date:  12/29/2012   Time: 2100 Type of Therapy:  wrap up group  Participation Level:  Active  Participation Quality:  Appropriate, Attentive, Sharing and Supportive  Affect:  Appropriate  Cognitive:  Appropriate  Insight:  Lacking  Engagement in Group:  Limited  Modes of Intervention:  Clarification, Education and Support  Summary of Progress/Problems:  Shelah Lewandowsky 12/30/2012, 3:09 AM

## 2012-12-30 NOTE — Progress Notes (Signed)
Patient ID: [name], male   DOB: 01-14-1956, 57 y.o.   MRN: [id number]  D: Pt has been very flat and depressed on the unit. Pt continues to complain of being dizzy, he has been running low blood pressures, he refused his morning dose of lasix and lisinopril. Aggie NP made aware of patients blood pressure, changes made to patients medication regimen. Pt also complained of not being able to sleep, Aggie NP also made aware of patients complaint. Pt has not gone to group and has not engaged in treatment. Pt reported being negative SI/HI, no AH/VH noted. A: 15 minute checks continued for patient safety. R: Pts safety maintained.

## 2012-12-30 NOTE — Progress Notes (Signed)
Patient ID: [name], male   DOB: 1955-08-06, 57 y.o.   MRN: [id number] Patient ID: [name], male   DOB: 10/25/1955, 57 y.o.   MRN: [id number] [facility] MD Progress Note  12/30/2012 5:31 PM [name]  MRN:  [id number]  Subjective:  [name] is endorsing that he is still feeling some light headedness today. He is also lying bed at the time. He rates his depression at #6 and anxiety at #7. Denies any SIHI.  Diagnosis:   DSM5: Schizophrenia Disorders:   Obsessive-Compulsive Disorders:   Trauma-Stressor Disorders:   Substance/Addictive Disorders:  Alcohol Related Disorder - Severe (303.90), Cocaine Related Disorders Depressive Disorders:  Major Depressive Disorder - Moderate (296.22)  Axis I: Depressive Disorder NOS and Substance Induced Mood Disorder  ADL's:  Intact  Sleep: Poor  Appetite:  Poor  Suicidal Ideation:  Plan:  denies Intent:  denies Means:  denies Homicidal Ideation:  Plan:  denies Intent:  denies Means:  denies AEB (as evidenced by):  Psychiatric Specialty Exam: Review of Systems  Constitutional: Positive for malaise/fatigue.  HENT: Negative.   Eyes: Negative.   Respiratory: Negative.   Cardiovascular: Negative.   Gastrointestinal: Negative.   Genitourinary: Negative.   Musculoskeletal: Positive for back pain.       "legs are weak"  Skin: Negative.   Neurological: Positive for dizziness and weakness.  Endo/Heme/Allergies: Negative.   Psychiatric/Behavioral: Positive for depression and substance abuse. The patient is nervous/anxious and has insomnia.     Blood pressure 93/62, pulse 86, temperature 98.1 F (36.7 C), temperature source Oral, resp. rate 18, height 6\' 3"  (1.905 m), weight 77.111 kg (170 lb).Body mass index is 21.25 kg/(m^2).  General Appearance: Fairly Groomed  Patent attorney::  Minimal  Speech:  Clear and Coherent and Slow  Volume:  Decreased  Mood:  Anxious and Depressed  Affect:  Restricted  Thought Process:  Coherent and  Goal Directed  Orientation:  Full (Time, Place, and Person)  Thought Content:  worries, concerns, symptoms  Suicidal Thoughts:  No  Homicidal Thoughts:  No  Memory:  Immediate;   Fair Recent;   Fair Remote;   Fair  Judgement:  Fair  Insight:  Shallow  Psychomotor Activity:  Psychomotor Retardation  Concentration:  Fair  Recall:  Fair  Akathisia:  No  Handed:  Right  AIMS (if indicated):     Assets:  Desire for Improvement  Sleep:  Number of Hours: 6.5   Current Medications: Current Facility-Administered Medications  Medication Dose Route Frequency Provider Last Rate Last Dose  . acetaminophen (TYLENOL) tablet 650 mg  650 mg Oral Q6H PRN Nelly Rout, MD      . alum & mag hydroxide-simeth (MAALOX/MYLANTA) 200-200-20 MG/5ML suspension 30 mL  30 mL Oral Q4H PRN Nelly Rout, MD      . aspirin EC tablet 81 mg  81 mg Oral Daily Sanjuana Kava, NP   81 mg at 12/30/12 0740  . buPROPion (WELLBUTRIN XL) 24 hr tablet 150 mg  150 mg Oral Daily Rachael Fee, MD   150 mg at 12/30/12 0740  . feeding supplement (GLUCERNA SHAKE) liquid 237 mL  237 mL Oral BID BM Rachael Fee, MD   237 mL at 12/30/12 1212  . [START ON 12/31/2012] furosemide (LASIX) tablet 10 mg  10 mg Oral Daily Sanjuana Kava, NP      . insulin aspart (novoLOG) injection 0-15 Units  0-15 Units Subcutaneous TID WC Nelly Rout, MD   5 Units  at 12/30/12 1720  . insulin aspart (novoLOG) injection 0-5 Units  0-5 Units Subcutaneous QHS Nelly Rout, MD   2 Units at 12/27/12 2147  . insulin glargine (LANTUS) injection 30 Units  30 Units Subcutaneous QHS Sanjuana Kava, NP   30 Units at 12/29/12 2125  . magnesium hydroxide (MILK OF MAGNESIA) suspension 30 mL  30 mL Oral Daily PRN Rachael Fee, MD      . metFORMIN (GLUCOPHAGE) tablet 500 mg  500 mg Oral BID WC Sanjuana Kava, NP   500 mg at 12/30/12 1720  . multivitamin with minerals tablet 1 tablet  1 tablet Oral Daily Nelly Rout, MD   1 tablet at 12/30/12 0740  . potassium chloride  (K-DUR) CR tablet 10 mEq  10 mEq Oral Daily Sanjuana Kava, NP   10 mEq at 12/30/12 0740  . sertraline (ZOLOFT) tablet 100 mg  100 mg Oral QHS Sanjuana Kava, NP   100 mg at 12/29/12 2123  . simvastatin (ZOCOR) tablet 5 mg  5 mg Oral q1800 Sanjuana Kava, NP   5 mg at 12/30/12 1720  . thiamine (VITAMIN B-1) tablet 100 mg  100 mg Oral Daily Nelly Rout, MD   100 mg at 12/30/12 0740  . traZODone (DESYREL) tablet 150 mg  150 mg Oral QHS Rachael Fee, MD   150 mg at 12/29/12 2123    Lab Results:  Results for orders placed during the hospital encounter of 12/20/12 (from the past 48 hour(s))  GLUCOSE, CAPILLARY     Status: Abnormal   Collection Time    12/28/12  9:09 PM      Result Value Range   Glucose-Capillary 171 (*) 70 - 99 mg/dL  GLUCOSE, CAPILLARY     Status: Abnormal   Collection Time    12/29/12  6:07 AM      Result Value Range   Glucose-Capillary 190 (*) 70 - 99 mg/dL   Comment 1 Notify RN     Comment 2 Documented in Chart    GLUCOSE, CAPILLARY     Status: Abnormal   Collection Time    12/29/12 11:59 AM      Result Value Range   Glucose-Capillary 219 (*) 70 - 99 mg/dL   Comment 1 Notify RN    GLUCOSE, CAPILLARY     Status: Abnormal   Collection Time    12/29/12  5:21 PM      Result Value Range   Glucose-Capillary 209 (*) 70 - 99 mg/dL   Comment 1 Notify RN    GLUCOSE, CAPILLARY     Status: Abnormal   Collection Time    12/29/12  8:54 PM      Result Value Range   Glucose-Capillary 184 (*) 70 - 99 mg/dL   Comment 1 Notify RN     Comment 2 Documented in Chart    GLUCOSE, CAPILLARY     Status: Abnormal   Collection Time    12/30/12  6:36 AM      Result Value Range   Glucose-Capillary 135 (*) 70 - 99 mg/dL   Comment 1 Notify RN     Comment 2 Documented in Chart    GLUCOSE, CAPILLARY     Status: Abnormal   Collection Time    12/30/12 11:42 AM      Result Value Range   Glucose-Capillary 233 (*) 70 - 99 mg/dL   Comment 1 Notify RN    GLUCOSE, CAPILLARY     Status:  Abnormal   Collection Time    12/30/12  5:16 PM      Result Value Range   Glucose-Capillary 225 (*) 70 - 99 mg/dL   Comment 1 Notify RN      Physical Findings: AIMS: Facial and Oral Movements Muscles of Facial Expression: None, normal Lips and Perioral Area: None, normal Jaw: None, normal Tongue: None, normal,Extremity Movements Upper (arms, wrists, hands, fingers): None, normal Lower (legs, knees, ankles, toes): None, normal, Trunk Movements Neck, shoulders, hips: None, normal, Overall Severity Severity of abnormal movements (highest score from questions above): None, normal Incapacitation due to abnormal movements: None, normal Patient's awareness of abnormal movements (rate only patient's report): No Awareness, Dental Status Current problems with teeth and/or dentures?: No Does patient usually wear dentures?: No  CIWA:  CIWA-Ar Total: 0 COWS:     Treatment Plan Summary: Daily contact with patient to assess and evaluate symptoms and progress in treatment Medication management  Plan: Supportive approach/coping skills/relapse prevention  Continue to monitor his BP, is showing some improvedment Discontinued BP medicine, decreased lasix from 20 mg to 10 mg daily. Encouraged to spend less time in bed, get up carefully, move around. Encourage group participation.  Medical Decision Making Problem Points:  Review of psycho-social stressors (1) Data Points:  Review of medication regiment & side effects (2)  I certify that inpatient services furnished can reasonably be expected to improve the patient's condition.   Armandina Stammer I, PMHNP-BC 12/30/2012, 5:31 PM

## 2012-12-30 NOTE — [facility] Group Notes (Signed)
[facility] Group Notes:  (Nursing/MHT/Case Management/Adjunct)  Date:  12/30/2012  Time:  3:33 PM  Type of Therapy:  Psychoeducational Skills  Participation Level:  Active   Jacquelyne Balint Shanta 12/30/2012, 3:33 PM

## 2012-12-30 NOTE — [facility] Group Notes (Signed)
[facility] Group Notes:  (Nursing/MHT/Case Management/Adjunct)  Date:  12/30/2012  Time:  11:46 AM  Type of Therapy:  Psychoeducational Skills  Participation Level:  Active  Participation Quality:  Appropriate  Affect:  Appropriate  Cognitive:  Appropriate  Insight:  Appropriate  Engagement in Group:  Engaged  Modes of Intervention:  Problem-solving  Summary of Progress/Problems: Pt attended healthy support systems group, and engaged in treatment.   Zygmund Passero Shanta 12/30/2012, 11:46 AM 

## 2012-12-31 LAB — GLUCOSE, CAPILLARY
Glucose-Capillary: 224 mg/dL — ABNORMAL HIGH (ref 70–99)
Glucose-Capillary: 177 mg/dL — ABNORMAL HIGH (ref 70–99)

## 2012-12-31 MED ORDER — TRAZODONE HCL 150 MG PO TABS: 150.0000 mg | ORAL_TABLET | Freq: Every day | ORAL | Status: DC

## 2012-12-31 MED ORDER — FUROSEMIDE 20 MG PO TABS: 10.0000 mg | ORAL_TABLET | Freq: Every day | ORAL | Status: DC

## 2012-12-31 NOTE — Progress Notes (Signed)
Adult Psychoeducational Group Note  Date:  12/31/2012 Time:  11:54 PM  Group Topic/Focus:  Wrap-Up Group:   The focus of this group is to help patients review their daily goal of treatment and discuss progress on daily workbooks.  Participation Level:  Active  Participation Quality:  Appropriate  Affect:  Excited  Cognitive:  Appropriate  Insight: Appropriate  Engagement in Group:  Engaged  Modes of Intervention:  Support  Additional Comments:  Pt was excited about his discharge plans to go to a 60 day treatment facility.  Humberto Seals Monique 12/31/2012, 11:54 PM

## 2012-12-31 NOTE — Progress Notes (Signed)
Patient ID: [name], male   DOB: Sep 07, 1955, 57 y.o.   MRN: [id number] D: pt. In dayroom watching TV, interacting with others. Pt. Reports "I think I can make it" Pt. Reports he's going to [name] in am and says he is ready to quit drinking and "I need to go" "I'm getting old now". A: Writer introduced self to client and provided emotional support. Pt. Encouraged to continue with recovery. Staff will monitor q99min for safety. Client encouraged to attend group. R: Pt. Is safe on the unit. Pt. Attended group.

## 2012-12-31 NOTE — Progress Notes (Signed)
[facility] Adult Case Management Discharge Plan :  Will you be returning to the same living situation after discharge: No. At discharge, do you have transportation home?:Yes,  bus pass Do you have the ability to pay for your medications:Yes,  Medicare  Release of information consent forms completed and in the chart;  Patient's signature needed at discharge.  Patient to Follow up at: Follow-up Information   Follow up with [facility] On 01/01/2013. (Arrive at 8AM with ID, 30 day medication supply, and clothing. Take bus to [place] on [address]. [facility] will be coming to pick you up at 8AM sharp from the [facility]. They were notified yesterday that you need transportation to [facility].  )    Contact information:   [address] [place], [place] [address] Phone: [phone] Fax: [phone]      Follow up with [facility]. (Walk in between 8am-9am for hospital followup/medication mangement. )    Contact information:   [address], [place] [address] Phone: [phone] Fax: [phone]      Patient denies SI/HI:   Yes,  during group/self report    Safety Planning and Suicide Prevention discussed:  Yes,  SPE completed with pt. He did not consent to family contact. Pt given SPI pamphlet and encouraged to share with support network.  [name], [name] 12/31/2012, 4:13 PM

## 2012-12-31 NOTE — Progress Notes (Signed)
[facility] MD Progress Note  12/31/2012 4:29 PM [name]  MRN:  [id number] Subjective:  Georg endorses he is feeling steady on his feet and is not experiencing the weakness in his legs as he was before. His mood is still " a little low," but better. He is looking forward to going to [facility] in the morning. States that he really wants to make this work for him as wants to quit drinking and doing drugs Diagnosis:   DSM5: Schizophrenia Disorders:   Obsessive-Compulsive Disorders:   Trauma-Stressor Disorders:   Substance/Addictive Disorders:  Alcohol Related Disorder - Severe (303.90), Cocaine Related Disorders Depressive Disorders:  Major Depressive Disorder - Moderate (296.22)  Axis I: Substance Induced Mood Disorder  ADL's:  Intact  Sleep: Fair  Appetite:  Fair  Suicidal Ideation:  Plan:  denies Intent:  denies Means:  denies Homicidal Ideation:  Plan:  denies Intent:  denies Means:  denies AEB (as evidenced by):  Psychiatric Specialty Exam: Review of Systems  Constitutional: Negative.   HENT: Negative.   Eyes: Negative.   Respiratory: Negative.   Cardiovascular: Negative.   Gastrointestinal: Negative.   Genitourinary: Negative.   Musculoskeletal: Positive for back pain.  Skin: Negative.   Neurological: Negative.   Endo/Heme/Allergies: Negative.   Psychiatric/Behavioral: Positive for substance abuse. The patient is nervous/anxious.     Blood pressure 94/64, pulse 82, temperature 97.2 F (36.2 C), temperature source Oral, resp. rate 18, height 6\' 3"  (1.905 m), weight 77.111 kg (170 lb).Body mass index is 21.25 kg/(m^2).  General Appearance: Fairly Groomed  Patent attorney::  Fair  Speech:  Clear and Coherent and Slow  Volume:  Decreased  Mood:  anxious, worried  Affect:  Appropriate  Thought Process:  Coherent and Goal Directed  Orientation:  Full (Time, Place, and Person)  Thought Content:  worries, concerns  Suicidal Thoughts:  No  Homicidal Thoughts:  No   Memory:  Immediate;   Fair Recent;   Fair Remote;   Fair  Judgement:  Fair  Insight:  Present  Psychomotor Activity:  Decreased  Concentration:  Fair  Recall:  Fair  Akathisia:  No  Handed:  Right  AIMS (if indicated):     Assets:  Desire for Improvement  Sleep:  Number of Hours: 5.25   Current Medications: Current Facility-Administered Medications  Medication Dose Route Frequency Provider Last Rate Last Dose  . acetaminophen (TYLENOL) tablet 650 mg  650 mg Oral Q6H PRN Nelly Rout, MD      . alum & mag hydroxide-simeth (MAALOX/MYLANTA) 200-200-20 MG/5ML suspension 30 mL  30 mL Oral Q4H PRN Nelly Rout, MD      . aspirin EC tablet 81 mg  81 mg Oral Daily Sanjuana Kava, NP   81 mg at 12/31/12 0800  . buPROPion (WELLBUTRIN XL) 24 hr tablet 150 mg  150 mg Oral Daily Rachael Fee, MD   150 mg at 12/31/12 0800  . feeding supplement (GLUCERNA SHAKE) liquid 237 mL  237 mL Oral BID BM Rachael Fee, MD   237 mL at 12/31/12 1417  . furosemide (LASIX) tablet 10 mg  10 mg Oral Daily Sanjuana Kava, NP   10 mg at 12/31/12 0800  . insulin aspart (novoLOG) injection 0-15 Units  0-15 Units Subcutaneous TID WC Nelly Rout, MD   3 Units at 12/31/12 1157  . insulin aspart (novoLOG) injection 0-5 Units  0-5 Units Subcutaneous QHS Nelly Rout, MD   2 Units at 12/27/12 2147  . insulin glargine (  LANTUS) injection 30 Units  30 Units Subcutaneous QHS Sanjuana Kava, NP   30 Units at 12/30/12 2125  . magnesium hydroxide (MILK OF MAGNESIA) suspension 30 mL  30 mL Oral Daily PRN Rachael Fee, MD      . metFORMIN (GLUCOPHAGE) tablet 500 mg  500 mg Oral BID WC Sanjuana Kava, NP   500 mg at 12/31/12 0801  . multivitamin with minerals tablet 1 tablet  1 tablet Oral Daily Nelly Rout, MD   1 tablet at 12/31/12 0800  . potassium chloride (K-DUR) CR tablet 10 mEq  10 mEq Oral Daily Sanjuana Kava, NP   10 mEq at 12/31/12 0800  . sertraline (ZOLOFT) tablet 100 mg  100 mg Oral QHS Sanjuana Kava, NP   100 mg at  12/30/12 2123  . simvastatin (ZOCOR) tablet 5 mg  5 mg Oral q1800 Sanjuana Kava, NP   5 mg at 12/30/12 1720  . thiamine (VITAMIN B-1) tablet 100 mg  100 mg Oral Daily Nelly Rout, MD   100 mg at 12/31/12 0800  . traZODone (DESYREL) tablet 150 mg  150 mg Oral QHS Rachael Fee, MD   150 mg at 12/30/12 2123    Lab Results:  Results for orders placed during the hospital encounter of 12/20/12 (from the past 48 hour(s))  GLUCOSE, CAPILLARY     Status: Abnormal   Collection Time    12/29/12  5:21 PM      Result Value Range   Glucose-Capillary 209 (*) 70 - 99 mg/dL   Comment 1 Notify RN    GLUCOSE, CAPILLARY     Status: Abnormal   Collection Time    12/29/12  8:54 PM      Result Value Range   Glucose-Capillary 184 (*) 70 - 99 mg/dL   Comment 1 Notify RN     Comment 2 Documented in Chart    GLUCOSE, CAPILLARY     Status: Abnormal   Collection Time    12/30/12  6:36 AM      Result Value Range   Glucose-Capillary 135 (*) 70 - 99 mg/dL   Comment 1 Notify RN     Comment 2 Documented in Chart    GLUCOSE, CAPILLARY     Status: Abnormal   Collection Time    12/30/12 11:42 AM      Result Value Range   Glucose-Capillary 233 (*) 70 - 99 mg/dL   Comment 1 Notify RN    GLUCOSE, CAPILLARY     Status: Abnormal   Collection Time    12/30/12  5:16 PM      Result Value Range   Glucose-Capillary 225 (*) 70 - 99 mg/dL   Comment 1 Notify RN    GLUCOSE, CAPILLARY     Status: Abnormal   Collection Time    12/30/12  9:20 PM      Result Value Range   Glucose-Capillary 175 (*) 70 - 99 mg/dL   Comment 1 Notify RN     Comment 2 Documented in Chart    GLUCOSE, CAPILLARY     Status: Abnormal   Collection Time    12/31/12  6:27 AM      Result Value Range   Glucose-Capillary 103 (*) 70 - 99 mg/dL  GLUCOSE, CAPILLARY     Status: Abnormal   Collection Time    12/31/12 11:22 AM      Result Value Range   Glucose-Capillary 167 (*) 70 - 99 mg/dL  Physical Findings: AIMS: Facial and Oral  Movements Muscles of Facial Expression: None, normal Lips and Perioral Area: None, normal Jaw: None, normal Tongue: None, normal,Extremity Movements Upper (arms, wrists, hands, fingers): None, normal Lower (legs, knees, ankles, toes): None, normal, Trunk Movements Neck, shoulders, hips: None, normal, Overall Severity Severity of abnormal movements (highest score from questions above): None, normal Incapacitation due to abnormal movements: None, normal Patient's awareness of abnormal movements (rate only patient's report): No Awareness, Dental Status Current problems with teeth and/or dentures?: No Does patient usually wear dentures?: No  CIWA:  CIWA-Ar Total: 0 COWS:     Treatment Plan Summary: Daily contact with patient to assess and evaluate symptoms and progress in treatment Medication management  Plan: Supportive approach/coping skills/relapse prevention           Will D/C in AM to go to [facility] rehab program  Medical Decision Making Problem Points:  Review of psycho-social stressors (1) Data Points:  Review of medication regiment & side effects (2)  I certify that inpatient services furnished can reasonably be expected to improve the patient's condition.   Tiffanye Hartmann A 12/31/2012, 4:29 PM

## 2012-12-31 NOTE — [facility] Group Notes (Signed)
[facility] LCSW Group Therapy  12/31/2012 4:05 PM  Type of Therapy:  Group Therapy  Participation Level:  Minimal  Participation Quality:  Drowsy  Affect:  Lethargic  Cognitive:  Lacking  Insight:  Limited  Engagement in Therapy:  Limited  Modes of Intervention:  Discussion, Education, Exploration, Socialization and Support  Summary of Progress/Problems: Today's Topic: Overcoming Obstacles. Pt identified obstacles faced currently and processed barriers involved in overcoming these obstacles. Pt identified steps necessary for overcoming these obstacles and explored motivation (internal and external) for facing these difficulties head on. Pt further identified one area of concern in their lives and chose a skill of focus pulled from their "toolbox." Delshon was lethargic throughout group but attempted to participate. He stated that his biggest obstacle was fighting cravings and maintaining sobriety without a structured environment. "I am blessed to be going to straight to [facility] tomorrow without having to go home first because I know I would relapse." Carmel shows improvement in the group setting AEB his ability to identify obstacles and identify ways to combat these anticipated obstacles. "I know I need to go into treatment. I have the motivation to change but I'm not strong enough to do it alone right now. I acknowledge that."    Smart, Shiya Fogelman 12/31/2012, 4:05 PM

## 2012-12-31 NOTE — [facility] Group Notes (Signed)
[facility] LCSW Aftercare Discharge Planning Group Note   12/31/2012 9:29 AM  Participation Quality:  Minimal  Mood/Affect:  Depressed  Depression Rating:  7  Anxiety Rating:  7  Thoughts of Suicide:  No Will you contract for safety?   NA  Current AVH:  No  Plan for Discharge/Comments:  Pt reports that he would like to stay here until [facility] admission date. Pt to be admitted on 9/9. He stated that his weekend here was "shaky" due to poor sleep and depressed mood. He reports no withdrawal symptoms currently.   Transportation Means: bus  Supports: none identified  Counselling psychologist, [employer]

## 2012-12-31 NOTE — [facility] Suicide Risk Assessment (Signed)
Suicide Risk Assessment  Discharge Assessment     Demographic Factors:  Male  Mental Status Per Nursing Assessment::   On Admission:  NA  Current Mental Status by Physician: In full contact with reality. There are no suicidal ideas, plans or intent. His mood is " better, little down." affect is appropriate. He is steady in his feet and his legs are stronger. He is committed to abstinence. He is going to [facility] rehab program in the morning and he is looking forward to it.   Loss Factors: NA  Historical Factors: NA  Risk Reduction Factors:   Positive social support  Continued Clinical Symptoms:  Depression:   Comorbid alcohol abuse/dependence Alcohol/Substance Abuse/Dependencies  Cognitive Features That Contribute To Risk:  Polarized thinking Thought constriction (tunnel vision)    Suicide Risk:  Minimal: No identifiable suicidal ideation.  Patients presenting with no risk factors but with morbid ruminations; may be classified as minimal risk based on the severity of the depressive symptoms  Discharge Diagnoses:   AXIS I:  Alcohol Dependence, Cocaine Dependence, Depressive Disorder NOS AXIS II:  Deferred AXIS III:   Past Medical History  Diagnosis Date  . Diabetes mellitus   . Hypertension   . Coronary artery disease   . MI (myocardial infarction) 66    age 57 per patient  . Mental disorder   . Depression    AXIS IV:  other psychosocial or environmental problems AXIS V:  61-70 mild symptoms  Plan Of Care/Follow-up recommendations:  Activity:  as tolerated Diet:  regular Follow up [facility] residential treatment program Is patient on multiple antipsychotic therapies at discharge:  No   Has Patient had three or more failed trials of antipsychotic monotherapy by history:  No  Recommended Plan for Multiple Antipsychotic Therapies: NA  [name] A 12/31/2012, 4:37 PM

## 2012-12-31 NOTE — Progress Notes (Signed)
Pt did not fill out self inventory sheet today, pt is going to groups, no complaints at this time, denies SI/HI/AVH, pt is pleasant and interacts with others in the milieu.  Emotional support provided, Encouraged pt to continue with treatment plan and attend all group activities, q15 min checks maintained for safety.

## 2012-12-31 NOTE — Progress Notes (Signed)
Adult Psychoeducational Group Note  Date:  12/31/2012 Time:  11:00AM Group Topic/Focus:  Wellness Toolbox:   The focus of this group is to discuss various aspects of wellness, balancing those aspects and exploring ways to increase the ability to experience wellness.  Patients will create a wellness toolbox for use upon discharge.  Participation Level:  Active  Participation Quality:  Appropriate and Attentive  Affect:  Appropriate  Cognitive:  Alert and Appropriate  Insight: Appropriate  Engagement in Group:  Engaged  Modes of Intervention:  Discussion  Additional Comments:  Pt. Was attentive and appropriate during today's group discussion. Pt. Was able to complete self care assessment. Pt stated that he need to improve on relationship self care pt scored 0-1 in this area however Pt scored 2-3 on physical self care.   Bing Plume D 12/31/2012, 1:07 PM

## 2013-01-01 DIAGNOSIS — F1994 Other psychoactive substance use, unspecified with psychoactive substance-induced mood disorder: Secondary | ICD-10-CM

## 2013-01-01 NOTE — Progress Notes (Signed)
Patient ID: [name], male   DOB: Jan 01, 1956, 57 y.o.   MRN: [id number] Writer reviewed discharge instructions, able to repeat. Pt. Denies SHI, AVH. Pt. Retrieved articles from locker #10. Bus pass, meds, RX given.

## 2013-01-02 NOTE — Discharge Summary (Signed)
Physician Discharge Summary Note  Patient:  [name] is an 57 y.o., male MRN:  [id number] DOB:  1955-05-10 Patient phone:  [phone] (home)  Patient address:   [address] [place] [address],   Date of Admission:  12/20/2012 Date of Discharge: 12/31/2012  Reason for Admission: 57 Y/O male who requested help as he was drinking and using crack cocaine and felt out of control. He had gotten some possession charges. He endorsed  history of depression and claimed compliance with medications.   Discharge Diagnoses: Active Problems:   Alcohol dependence   Cocaine dependence   Substance induced mood disorder  Review of Systems  Constitutional: Negative.   HENT: Negative.   Eyes: Negative.   Respiratory: Negative.   Cardiovascular: Negative.   Gastrointestinal: Negative.   Genitourinary: Negative.   Musculoskeletal: Negative.   Skin: Negative.   Neurological: Negative.   Endo/Heme/Allergies: Negative.   Psychiatric/Behavioral: Positive for substance abuse.    DSM5:  Schizophrenia Disorders:  N/A Obsessive-Compulsive Disorders:  N/A Trauma-Stressor Disorders:  N/A Substance/Addictive Disorders:  Alcohol Related Disorder - Severe (303.90), Cocaine Related Disorders Depressive Disorders:  Major Depressive Disorder - Moderate (296.22)  Axis Diagnosis:   AXIS I:  Substance Induced Mood Disorder AXIS II:  Deferred AXIS III:   Past Medical History  Diagnosis Date  . Diabetes mellitus   . Hypertension   . Coronary artery disease   . MI (myocardial infarction) 41    age 72 per patient  . Mental disorder   . Depression    AXIS IV:  other psychosocial or environmental problems and problems with primary support group AXIS V:  51-60 moderate symptoms  Level of Care:  [facility]  Hospital Course:  He was admitted and started in individual and group psychotherapy. He was detox with Librium. The detox went uneventfully. His blood pressure medications were resumed. His  blood pressure remained on the low side. He experienced weakness, lightheadness, unsteadiness in his feet. His BP medications were adjusted down. He completed his detox ,his antidepressants dosages were optimized: Zoloft 100 mg , Wellbutrin XL 150 mg in AM. His mood improved his affect became brighter. He endorsed no SI intentions or plans. He was medically stable. He was D/C to be admitted to [facility] residential treatment center  Consults:  None  Significant Diagnostic Studies:  labs: as per the chart  Discharge Vitals:   Blood pressure 94/64, pulse 82, temperature 97.2 F (36.2 C), temperature source Oral, resp. rate 18, height 6\' 3"  (1.905 m), weight 77.111 kg (170 lb). Body mass index is 21.25 kg/(m^2). Lab Results:   Results for orders placed during the hospital encounter of 12/20/12 (from the past 72 hour(s))  GLUCOSE, CAPILLARY     Status: Abnormal   Collection Time    12/30/12  9:20 PM      Result Value Range   Glucose-Capillary 175 (*) 70 - 99 mg/dL   Comment 1 Notify RN     Comment 2 Documented in Chart    GLUCOSE, CAPILLARY     Status: Abnormal   Collection Time    12/31/12  6:27 AM      Result Value Range   Glucose-Capillary 103 (*) 70 - 99 mg/dL  GLUCOSE, CAPILLARY     Status: Abnormal   Collection Time    12/31/12 11:22 AM      Result Value Range   Glucose-Capillary 167 (*) 70 - 99 mg/dL  GLUCOSE, CAPILLARY     Status: Abnormal   Collection  Time    12/31/12  4:50 PM      Result Value Range   Glucose-Capillary 224 (*) 70 - 99 mg/dL  GLUCOSE, CAPILLARY     Status: Abnormal   Collection Time    12/31/12  9:24 PM      Result Value Range   Glucose-Capillary 177 (*) 70 - 99 mg/dL   Comment 1 Notify RN      Physical Findings: AIMS: Facial and Oral Movements Muscles of Facial Expression: None, normal Lips and Perioral Area: None, normal Jaw: None, normal Tongue: None, normal,Extremity Movements Upper (arms, wrists, hands, fingers): None, normal Lower (legs,  knees, ankles, toes): None, normal, Trunk Movements Neck, shoulders, hips: None, normal, Overall Severity Severity of abnormal movements (highest score from questions above): None, normal Incapacitation due to abnormal movements: None, normal Patient's awareness of abnormal movements (rate only patient's report): No Awareness, Dental Status Current problems with teeth and/or dentures?: No Does patient usually wear dentures?: No  CIWA:  CIWA-Ar Total: 0 COWS:     Psychiatric Specialty Exam: See Psychiatric Specialty Exam and Suicide Risk Assessment completed by Attending Physician prior to discharge.  Discharge destination:  [facility]  Is patient on multiple antipsychotic therapies at discharge:  No   Has Patient had three or more failed trials of antipsychotic monotherapy by history:  No  Recommended Plan for Multiple Antipsychotic Therapies: NA     Medication List    STOP taking these medications       buPROPion 100 MG tablet  Commonly known as:  WELLBUTRIN  Replaced by:  buPROPion 150 MG 24 hr tablet      TAKE these medications     Indication   aspirin 81 MG EC tablet  Take 1 tablet (81 mg total) by mouth daily. For heart health   Indication:  Heart health     buPROPion 150 MG 24 hr tablet  Commonly known as:  WELLBUTRIN XL  Take 1 tablet (150 mg total) by mouth daily. For depression   Indication:  Major Depressive Disorder     furosemide 20 MG tablet  Commonly known as:  LASIX  Take 0.5 tablets (10 mg total) by mouth daily.   Indication:  Edema, High Blood Pressure     insulin glargine 100 UNIT/ML injection  Commonly known as:  LANTUS  Inject 0.3 mLs (30 Units total) into the skin at bedtime. For high blood sugar control   Indication:  Type 2 Diabetes     lisinopril 10 MG tablet  Commonly known as:  PRINIVIL,ZESTRIL  Take 1 tablet (10 mg total) by mouth daily. For high blood pressure control   Indication:  High Blood Pressure     metFORMIN 1000  MG tablet  Commonly known as:  GLUCOPHAGE  Take 0.5 tablets (500 mg total) by mouth 2 (two) times daily with a meal. For high blood sugar control   Indication:  Type 2 Diabetes     potassium chloride 10 MEQ tablet  Commonly known as:  K-DUR  Take 1 tablet (10 mEq total) by mouth daily. For low potassium supplement   Indication:  Low Amount of Potassium in the Blood     pravastatin 10 MG tablet  Commonly known as:  PRAVACHOL  Take 1 tablet (10 mg total) by mouth daily. For high cholesterol control   Indication:  Inherited Heterozygous Hypercholesterolemia, Type II A Hyperlipidemia     sertraline 100 MG tablet  Commonly known as:  ZOLOFT  Take 1 tablet (100  mg total) by mouth at bedtime. For depression   Indication:  Major Depressive Disorder     traZODone 100 MG tablet  Commonly known as:  DESYREL  Take 1 tablet (100 mg total) by mouth at bedtime. For sleep   Indication:  Trouble Sleeping     traZODone 150 MG tablet  Commonly known as:  DESYREL  Take 1 tablet (150 mg total) by mouth at bedtime.   Indication:  Trouble Sleeping           Follow-up Information   Follow up with [facility] On 01/01/2013. (Arrive at 8AM with ID, 30 day medication supply, and clothing. Take bus to [place] on [address]. [facility] will be coming to pick you up at 8AM sharp from the [place]. They were notified yesterday that you need transportation to [facility].  )    Contact information:   [address] [place], [place] [address] Phone: [phone] Fax: [phone]      Follow up with [facility]. (Walk in between 8am-9am for hospital followup/medication mangement. )    Contact information:   [address] [place] [address] Phone: [phone] Fax: [phone]      Follow-up recommendations:  Activity:  as tolerated Diet:  regular  Comments:  Continue to work a relapse prevention plan once D/C from [facility]  Total Discharge Time:  Greater than 30  minutes.  Signed: Danna Casella A 01/02/2013, 7:53 PM

## 2013-01-04 NOTE — Progress Notes (Signed)
Patient Discharge Instructions:  After Visit Summary (AVS):   Faxed to:  01/04/13 Discharge Summary Note:   Faxed to:  01/04/13 Psychiatric Admission Assessment Note:   Faxed to:  01/04/13 Suicide Risk Assessment - Discharge Assessment:   Faxed to:  01/04/13 Faxed/Sent to the Next Level Care provider:  01/04/13 Faxed to [facility] @ [phone] Faxed to [facility] @ [phone]  Jerelene Redden, 01/04/2013, 3:56 PM

## 2013-06-08 ENCOUNTER — Encounter (HOSPITAL_COMMUNITY): Payer: Self-pay | Admitting: Emergency Medicine

## 2013-06-08 ENCOUNTER — Emergency Department (HOSPITAL_COMMUNITY): Payer: Medicare Other

## 2013-06-08 ENCOUNTER — Emergency Department (HOSPITAL_COMMUNITY)
Admission: EM | Admit: 2013-06-08 | Discharge: 2013-06-08 | Disposition: A | Discharge status: Home / Self Care | Payer: Medicare Other | Attending: Emergency Medicine | Admitting: Emergency Medicine

## 2013-06-08 DIAGNOSIS — I252 Old myocardial infarction: Secondary | ICD-10-CM | POA: Insufficient documentation

## 2013-06-08 DIAGNOSIS — F17200 Nicotine dependence, unspecified, uncomplicated: Secondary | ICD-10-CM | POA: Insufficient documentation

## 2013-06-08 DIAGNOSIS — R69 Illness, unspecified: Secondary | ICD-10-CM

## 2013-06-08 DIAGNOSIS — F489 Nonpsychotic mental disorder, unspecified: Secondary | ICD-10-CM | POA: Insufficient documentation

## 2013-06-08 DIAGNOSIS — Z794 Long term (current) use of insulin: Secondary | ICD-10-CM | POA: Insufficient documentation

## 2013-06-08 DIAGNOSIS — Z7982 Long term (current) use of aspirin: Secondary | ICD-10-CM | POA: Insufficient documentation

## 2013-06-08 DIAGNOSIS — E119 Type 2 diabetes mellitus without complications: Secondary | ICD-10-CM | POA: Insufficient documentation

## 2013-06-08 DIAGNOSIS — R110 Nausea: Secondary | ICD-10-CM | POA: Insufficient documentation

## 2013-06-08 DIAGNOSIS — F329 Major depressive disorder, single episode, unspecified: Secondary | ICD-10-CM | POA: Insufficient documentation

## 2013-06-08 DIAGNOSIS — F3289 Other specified depressive episodes: Secondary | ICD-10-CM | POA: Insufficient documentation

## 2013-06-08 DIAGNOSIS — Z79899 Other long term (current) drug therapy: Secondary | ICD-10-CM | POA: Insufficient documentation

## 2013-06-08 DIAGNOSIS — J111 Influenza due to unidentified influenza virus with other respiratory manifestations: Secondary | ICD-10-CM | POA: Insufficient documentation

## 2013-06-08 DIAGNOSIS — I10 Essential (primary) hypertension: Secondary | ICD-10-CM | POA: Insufficient documentation

## 2013-06-08 DIAGNOSIS — I2510 Atherosclerotic heart disease of native coronary artery without angina pectoris: Secondary | ICD-10-CM | POA: Insufficient documentation

## 2013-06-08 LAB — BASIC METABOLIC PANEL
BUN: 13 mg/dL (ref 6–23)
CALCIUM: 9.2 mg/dL (ref 8.4–10.5)
CO2: 26 mEq/L (ref 19–32)
Chloride: 96 mEq/L (ref 96–112)
Creatinine, Ser: 0.89 mg/dL (ref 0.50–1.35)
GFR calc Af Amer: >90 mL/min (ref >90)
Glucose, Bld: 125 mg/dL — ABNORMAL HIGH (ref 70–99)
POTASSIUM: 4.3 meq/L (ref 3.7–5.3)
Sodium: 136 mEq/L — ABNORMAL LOW (ref 137–147)

## 2013-06-08 LAB — CBC WITH DIFFERENTIAL/PLATELET
Basophils Absolute: 0 10*3/uL (ref 0.0–0.1)
Basophils Relative: 1 % (ref 0–1)
EOS ABS: 0 10*3/uL (ref 0.0–0.7)
EOS PCT: 0 % (ref 0–5)
HEMATOCRIT: 44 % (ref 39.0–52.0)
HEMOGLOBIN: 15.2 g/dL (ref 13.0–17.0)
LYMPHS PCT: 22 % (ref 12–46)
Lymphs Abs: 1.1 10*3/uL (ref 0.7–4.0)
MCH: 29 pg (ref 26.0–34.0)
MCHC: 34.5 g/dL (ref 30.0–36.0)
MCV: 84 fL (ref 78.0–100.0)
MONOS PCT: 10 % (ref 3–12)
Monocytes Absolute: 0.5 10*3/uL (ref 0.1–1.0)
Neutro Abs: 3.3 10*3/uL (ref 1.7–7.7)
Neutrophils Relative %: 67 % (ref 43–77)
Platelets: 203 10*3/uL (ref 150–400)
RBC: 5.24 MIL/uL (ref 4.22–5.81)
RDW: 12.1 % (ref 11.5–15.5)
WBC: 4.9 10*3/uL (ref 4.0–10.5)

## 2013-06-08 LAB — GLUCOSE, CAPILLARY: Glucose-Capillary: 114 mg/dL — ABNORMAL HIGH (ref 70–99)

## 2013-06-08 MED ORDER — SODIUM CHLORIDE 0.9 % IV BOLUS (SEPSIS)
1000.0000 mL | Freq: Once | INTRAVENOUS | Status: AC
  Administered 2013-06-08: 1000 mL via INTRAVENOUS

## 2013-06-08 MED ORDER — OSELTAMIVIR PHOSPHATE 75 MG PO CAPS: 75.0000 mg | ORAL_CAPSULE | Freq: Two times a day (BID) | ORAL | Status: DC

## 2013-06-08 MED ORDER — BENZONATATE 100 MG PO CAPS: 100.0000 mg | ORAL_CAPSULE | Freq: Three times a day (TID) | ORAL | Status: DC

## 2013-06-08 MED ORDER — OSELTAMIVIR PHOSPHATE 75 MG PO CAPS
75.0000 mg | ORAL_CAPSULE | Freq: Once | ORAL | Status: AC
  Administered 2013-06-08: 75 mg via ORAL
  Filled 2013-06-08: qty 1

## 2013-06-08 NOTE — ED Provider Notes (Signed)
CSN: [id number]     Arrival date & time 06/08/13  1711 History   First MD Initiated Contact with Patient 06/08/13 1749     Chief Complaint  Patient presents with  . Sore Throat  . Cough  . Generalized Body Aches     (Consider location/radiation/quality/duration/timing/severity/associated sxs/prior Treatment) HPI Comments: Patient with history of diabetes on insulin, MI, alcohol use, CHF -- presents with complaint of nasal congestion, sore throat, productive cough, myalgias that began yesterday. Patient has had nausea but no vomiting. He has had multiple episodes of watery diarrhea. He denies ear pain. He has had chills and sweats but no documented fevers. He did get a flu shot this year. No abdominal pain, CP, SOB. He has taken Robitussin without relief. The onset of this condition was acute. The course is constant. Aggravating factors: none. Alleviating factors: none.    Patient is a 58 y.o. male presenting with pharyngitis and cough. The history is provided by the patient and medical records.  Sore Throat Associated symptoms include chills, congestion, coughing, fatigue, myalgias, nausea and a sore throat. Pertinent negatives include no abdominal pain, fever, headaches, rash or vomiting.  Cough Associated symptoms: chills, myalgias, rhinorrhea and sore throat   Associated symptoms: no ear pain, no fever, no headaches, no rash, no shortness of breath and no wheezing     Past Medical History  Diagnosis Date  . Diabetes mellitus   . Hypertension   . Coronary artery disease   . MI (myocardial infarction) 681984    age 58 per patient  . Mental disorder   . Depression    Past Surgical History  Procedure Laterality Date  . Angioplasty  1984    at age 58   No family history on file. History  Substance Use Topics  . Smoking status: Current Every Day Smoker -- 0.50 packs/day    Types: Cigarettes  . Smokeless tobacco: Never Used  . Alcohol Use: Yes     Comment: drinking daily for 4  months    Review of Systems  Constitutional: Positive for chills and fatigue. Negative for fever.  HENT: Positive for congestion, rhinorrhea and sore throat. Negative for ear pain and sinus pressure.   Eyes: Negative for redness.  Respiratory: Positive for cough. Negative for shortness of breath and wheezing.   Gastrointestinal: Positive for nausea. Negative for vomiting, abdominal pain, diarrhea, constipation and blood in stool.  Genitourinary: Negative for dysuria.  Musculoskeletal: Positive for myalgias. Negative for neck stiffness.  Skin: Negative for rash.  Neurological: Negative for headaches.  Hematological: Negative for adenopathy.      Allergies  Review of patient's allergies indicates no known allergies.  Home Medications   Current Outpatient Rx  Name  Route  Sig  Dispense  Refill  . aspirin 81 MG EC tablet   Oral   Take 1 tablet (81 mg total) by mouth daily. For heart health   30 tablet   1   . buPROPion (WELLBUTRIN XL) 150 MG 24 hr tablet   Oral   Take 1 tablet (150 mg total) by mouth daily. For depression   30 tablet   o   . furosemide (LASIX) 20 MG tablet   Oral   Take 0.5 tablets (10 mg total) by mouth daily.   30 tablet   0   . insulin glargine (LANTUS) 100 UNIT/ML injection   Subcutaneous   Inject 0.3 mLs (30 Units total) into the skin at bedtime. For high blood sugar control  10 mL   12   . lisinopril (PRINIVIL,ZESTRIL) 10 MG tablet   Oral   Take 1 tablet (10 mg total) by mouth daily. For high blood pressure control   30 tablet   0   . metFORMIN (GLUCOPHAGE) 1000 MG tablet   Oral   Take 0.5 tablets (500 mg total) by mouth 2 (two) times daily with a meal. For high blood sugar control         . potassium chloride (K-DUR) 10 MEQ tablet   Oral   Take 1 tablet (10 mEq total) by mouth daily. For low potassium supplement   14 tablet   0   . pravastatin (PRAVACHOL) 10 MG tablet   Oral   Take 1 tablet (10 mg total) by mouth daily. For  high cholesterol control   30 tablet   0   . sertraline (ZOLOFT) 100 MG tablet   Oral   Take 1 tablet (100 mg total) by mouth at bedtime. For depression   30 tablet   0   . traZODone (DESYREL) 100 MG tablet   Oral   Take 1 tablet (100 mg total) by mouth at bedtime. For sleep   30 tablet   0   . traZODone (DESYREL) 150 MG tablet   Oral   Take 1 tablet (150 mg total) by mouth at bedtime.   30 tablet   0    BP 136/72  Pulse 95  Temp(Src) 98.4 F (36.9 C) (Oral)  Resp 16  SpO2 97% Physical Exam  Nursing note and vitals reviewed. Constitutional: He appears well-developed and well-nourished.  HENT:  Head: Normocephalic and atraumatic.  Right Ear: Hearing, tympanic membrane and ear canal normal.  Left Ear: Hearing, tympanic membrane and ear canal normal.  Nose: Nose normal. No mucosal edema or rhinorrhea.  Mouth/Throat: Oropharynx is clear and moist and mucous membranes are normal.  Eyes: Conjunctivae are normal. Right eye exhibits no discharge. Left eye exhibits no discharge.  Neck: Normal range of motion. Neck supple.  Cardiovascular: Normal rate, regular rhythm and normal heart sounds.   No murmur heard. Pulmonary/Chest: Effort normal and breath sounds normal. No respiratory distress. He has no wheezes. He has no rales.  Abdominal: Soft. There is no tenderness.  Neurological: He is alert.  Skin: Skin is warm and dry.  Psychiatric: He has a normal mood and affect.    ED Course  Procedures (including critical care time) Labs Review Labs Reviewed  GLUCOSE, CAPILLARY - Abnormal; Notable for the following:    Glucose-Capillary 114 (*)    All other components within normal limits  BASIC METABOLIC PANEL - Abnormal; Notable for the following:    Sodium 136 (*)    Glucose, Bld 125 (*)    All other components within normal limits  CBC WITH DIFFERENTIAL   Imaging Review Dg Chest 2 View  06/08/2013   CLINICAL DATA:  Fever, chills, cough, dizziness  EXAM: CHEST  2 VIEW   COMPARISON:  DG CHEST 2V dated 05/20/2013; DG CHEST 2 VIEW dated 08/11/2012; DG CHEST 2 VIEW dated 11/23/2008; DG CHEST 2 VIEW dated 01/23/2008; DG CHEST 2 VIEW dated 06/09/2003  FINDINGS: Grossly unchanged cardiac silhouette and mediastinal contours. The lungs are hyperexpanded with flattening of the bilateral hemidiaphragms. No focal airspace opacities. No pleural effusion pneumothorax, though note, the bilateral costophrenic angles are excluded on the provided lateral radiograph. A nipple shadow again overlies the right lower lung, similar to the prior examination performed 12/2007. There is minimal pleural  parenchymal thickening about the right minor and bilateral major fissures. No evidence of edema. Unchanged bones.  IMPRESSION: Hyperexpanded lungs without acute cardiopulmonary disease.   Electronically Signed   By: Simonne Come M.D.   On: 06/08/2013 18:25    EKG Interpretation   None      6:10 PM Patient seen and examined. Work-up initiated. Medications ordered.   Vital signs reviewed and are as follows: Filed Vitals:   06/08/13 1715  BP: 136/72  Pulse: 95  Temp: 98.4 F (36.9 C)  Resp: 16   8:00 PM Pt has received fluids. Informed of all results. Cannot exclude influenza. Will start on Tamiflu given comorbidities. Chest x-ray reassuring. Patient discharged to home. Encouraged to rest and drink plenty of fluids.  Patient told to return to ED or see their primary doctor if their symptoms worsen, high fever not controlled with tylenol, persistent vomiting, they feel they are dehydrated, or if they have any other concerns.  Patient verbalized understanding and agreed with plan.     MDM   Final diagnoses:  Influenza-like illness   Patient with symptoms consistent with influenza or viral illness. Chest x-ray does not show any signs of CHF or signs of pneumonia. Vitals are stable, no fever in emergency department.  No signs of dehydration, tolerating PO's.  Lungs are clear.  Supportive therapy +  Tamiflu indicated with return if symptoms worsen.       Renne Crigler, PA-C 06/08/13 2002

## 2013-06-08 NOTE — ED Notes (Addendum)
Pt from home c/o sore throat, productive cough with yellow sputum, body aches x1 day. Pt denies N/V/D. Pt is A&O and in NAD. Pt has taken ibuprofen and Nyquil cold and flu with no relief.

## 2013-06-08 NOTE — ED Notes (Signed)
Patient transported to X-ray 

## 2013-06-08 NOTE — Discharge Instructions (Signed)
Please read and follow all provided instructions.  Your diagnoses today include:  1. Influenza-like illness     Tests performed today include:  Blood counts and electrolytes - normal  Chest x-ray - no pneumonia, no fluid in lungs  Vital signs. See below for your results today.   Medications prescribed:   Tamiflu - medication for flu   Tessalon Perles - cough suppressant medication  Take any prescribed medications only as directed.  Home care instructions:  Follow any educational materials contained in this packet. Please continue drinking plenty of fluids. Use over-the-counter cold and flu medications as needed as directed on packaging for symptom relief. You may also use ibuprofen or tylenol as directed on packaging for pain or fever.    Follow-up instructions: Please follow-up with your primary care provider in the next 3 days for further evaluation of your symptoms. If you do not have a primary care doctor -- see below for referral information.   Return instructions:   Please return to the Emergency Department if you experience worsening symptoms.  Please return if you have a high fever greater than 101 degrees not controlled with over-the-counter medications, persistent vomiting and cannot keep down fluids, or worsening trouble breathing.  Please return if you have any other emergent concerns.  Additional Information:  Your vital signs today were: BP 136/72   Pulse 95   Temp(Src) 98.4 F (36.9 C) (Oral)   Resp 16   SpO2 97% If your blood pressure (BP) was elevated above 135/85 this visit, please have this repeated by your doctor within one month.

## 2013-06-11 NOTE — ED Provider Notes (Signed)
Medical screening examination/treatment/procedure(s) were performed by non-physician practitioner and as supervising physician I was immediately available for consultation/collaboration.  EKG Interpretation   None         Shelda Jakes, MD 06/11/13 [phone]

## 2013-12-28 ENCOUNTER — Emergency Department (HOSPITAL_COMMUNITY): Payer: Medicare Other

## 2013-12-28 ENCOUNTER — Encounter (HOSPITAL_COMMUNITY): Payer: Self-pay | Admitting: Emergency Medicine

## 2013-12-28 ENCOUNTER — Emergency Department (HOSPITAL_COMMUNITY)
Admission: EM | Admit: 2013-12-28 | Discharge: 2013-12-28 | Disposition: A | Discharge status: Home / Self Care | Payer: Medicare Other | Attending: Emergency Medicine | Admitting: Emergency Medicine

## 2013-12-28 DIAGNOSIS — I252 Old myocardial infarction: Secondary | ICD-10-CM | POA: Insufficient documentation

## 2013-12-28 DIAGNOSIS — IMO0002 Reserved for concepts with insufficient information to code with codable children: Secondary | ICD-10-CM | POA: Diagnosis not present

## 2013-12-28 DIAGNOSIS — E119 Type 2 diabetes mellitus without complications: Secondary | ICD-10-CM | POA: Insufficient documentation

## 2013-12-28 DIAGNOSIS — Z7982 Long term (current) use of aspirin: Secondary | ICD-10-CM | POA: Insufficient documentation

## 2013-12-28 DIAGNOSIS — Z794 Long term (current) use of insulin: Secondary | ICD-10-CM | POA: Insufficient documentation

## 2013-12-28 DIAGNOSIS — S99929A Unspecified injury of unspecified foot, initial encounter: Secondary | ICD-10-CM

## 2013-12-28 DIAGNOSIS — F3289 Other specified depressive episodes: Secondary | ICD-10-CM | POA: Insufficient documentation

## 2013-12-28 DIAGNOSIS — W19XXXA Unspecified fall, initial encounter: Secondary | ICD-10-CM

## 2013-12-28 DIAGNOSIS — X500XXA Overexertion from strenuous movement or load, initial encounter: Secondary | ICD-10-CM | POA: Diagnosis not present

## 2013-12-28 DIAGNOSIS — I2510 Atherosclerotic heart disease of native coronary artery without angina pectoris: Secondary | ICD-10-CM | POA: Insufficient documentation

## 2013-12-28 DIAGNOSIS — S99919A Unspecified injury of unspecified ankle, initial encounter: Secondary | ICD-10-CM | POA: Diagnosis present

## 2013-12-28 DIAGNOSIS — S8990XA Unspecified injury of unspecified lower leg, initial encounter: Secondary | ICD-10-CM | POA: Diagnosis present

## 2013-12-28 DIAGNOSIS — Z79899 Other long term (current) drug therapy: Secondary | ICD-10-CM | POA: Insufficient documentation

## 2013-12-28 DIAGNOSIS — F329 Major depressive disorder, single episode, unspecified: Secondary | ICD-10-CM | POA: Insufficient documentation

## 2013-12-28 DIAGNOSIS — F17200 Nicotine dependence, unspecified, uncomplicated: Secondary | ICD-10-CM | POA: Diagnosis not present

## 2013-12-28 DIAGNOSIS — Y92009 Unspecified place in unspecified non-institutional (private) residence as the place of occurrence of the external cause: Secondary | ICD-10-CM | POA: Insufficient documentation

## 2013-12-28 DIAGNOSIS — S8391XA Sprain of unspecified site of right knee, initial encounter: Secondary | ICD-10-CM

## 2013-12-28 DIAGNOSIS — I10 Essential (primary) hypertension: Secondary | ICD-10-CM | POA: Diagnosis not present

## 2013-12-28 DIAGNOSIS — Y9389 Activity, other specified: Secondary | ICD-10-CM | POA: Diagnosis not present

## 2013-12-28 MED ORDER — ACETAMINOPHEN 325 MG PO TABS
650.0000 mg | ORAL_TABLET | Freq: Once | ORAL | Status: AC
  Administered 2013-12-28: 650 mg via ORAL
  Filled 2013-12-28: qty 2

## 2013-12-28 MED ORDER — NAPROXEN 500 MG PO TABS: 500.0000 mg | ORAL_TABLET | Freq: Two times a day (BID) | ORAL | Status: DC

## 2013-12-28 NOTE — ED Notes (Signed)
Stepped in hole in backyard 2 days ago and heard pop in right knee. Pain and swelling in right knee increasing over last two days. Able to bear weight with support. Reports mechanical fall, asymptomatic prior to stepping in the hole.

## 2013-12-28 NOTE — ED Notes (Signed)
Patient transported to X-ray 

## 2013-12-28 NOTE — ED Provider Notes (Signed)
CSN: [id number]     Arrival date & time 12/28/13  1158 History   First MD Initiated Contact with Patient 12/28/13 1211     Chief Complaint  Patient presents with  . Knee Pain     (Consider location/radiation/quality/duration/timing/severity/associated sxs/prior Treatment) HPI  58 year old male with hx of DM, CAD, mental disorder, presents c/o R knee pain.  Report mechanical fall when he accidentally stepped in a hole in backyard 2 days ago and heard a pop in R knee.  Report acute onset of sharp pain to medial aspect of R knee, and also pain to L ankle from the fall. Pain has been 10/10, persistent, worsening with movement. Denies hitting head of LOC from the fall.  Pt was unable to ambulate afterward, requiring help to bed and reportedly being bedbound since.  No specific treatment tried except drinking alcohol.  Denies hips pain, numbness or weakness.  Denies prior injury to the same knee.    Past Medical History  Diagnosis Date  . Diabetes mellitus   . Hypertension   . Coronary artery disease   . MI (myocardial infarction) 37    age 58 per patient  . Mental disorder   . Depression    Past Surgical History  Procedure Laterality Date  . Angioplasty  1984    at age 72   History reviewed. No pertinent family history. History  Substance Use Topics  . Smoking status: Current Every Day Smoker -- 0.50 packs/day    Types: Cigarettes  . Smokeless tobacco: Never Used  . Alcohol Use: 1.2 oz/week    2 Cans of beer per week     Comment: drinking daily (2 40oz beer a day)    Review of Systems  Constitutional: Negative for fever.  Musculoskeletal: Positive for arthralgias. Negative for back pain.  Skin: Negative for rash and wound.  Neurological: Negative for numbness.      Allergies  Review of patient's allergies indicates no known allergies.  Home Medications   Prior to Admission medications   Medication Sig Start Date End Date Taking? Authorizing Provider  aspirin 81 MG  EC tablet Take 1 tablet (81 mg total) by mouth daily. For heart health 12/27/12   Sanjuana Kava, NP  benzonatate (TESSALON) 100 MG capsule Take 1 capsule (100 mg total) by mouth every 8 (eight) hours. 06/08/13   Renne Crigler, PA-C  buPROPion (WELLBUTRIN XL) 150 MG 24 hr tablet Take 1 tablet (150 mg total) by mouth daily. For depression 12/27/12   Sanjuana Kava, NP  furosemide (LASIX) 20 MG tablet Take 0.5 tablets (10 mg total) by mouth daily. 12/31/12   Fransisca Kaufmann, NP  ibuprofen (ADVIL,MOTRIN) 200 MG tablet Take 600 mg by mouth every 6 (six) hours as needed for fever.    Historical Provider, MD  insulin glargine (LANTUS) 100 UNIT/ML injection Inject 0.3 mLs (30 Units total) into the skin at bedtime. For high blood sugar control 12/27/12   Sanjuana Kava, NP  lisinopril (PRINIVIL,ZESTRIL) 20 MG tablet Take 20 mg by mouth daily.    Historical Provider, MD  metFORMIN (GLUCOPHAGE) 1000 MG tablet Take 0.5 tablets (500 mg total) by mouth 2 (two) times daily with a meal. For high blood sugar control 12/27/12   Sanjuana Kava, NP  oseltamivir (TAMIFLU) 75 MG capsule Take 1 capsule (75 mg total) by mouth every 12 (twelve) hours. 06/08/13   Renne Crigler, PA-C  potassium chloride (K-DUR) 10 MEQ tablet Take 1 tablet (10 mEq total)  by mouth daily. For low potassium supplement 12/27/12   Sanjuana Kava, NP  pravastatin (PRAVACHOL) 10 MG tablet Take 1 tablet (10 mg total) by mouth daily. For high cholesterol control 12/27/12   Sanjuana Kava, NP  Pseudoeph-Doxylamine-DM-APAP (NYQUIL PO) Take 30 mLs by mouth at bedtime as needed (flu-like symptoms).    Historical Provider, MD  sertraline (ZOLOFT) 100 MG tablet Take 1 tablet (100 mg total) by mouth at bedtime. For depression 12/27/12   Sanjuana Kava, NP  traZODone (DESYREL) 100 MG tablet Take 1 tablet (100 mg total) by mouth at bedtime. For sleep 12/27/12   Sanjuana Kava, NP   Ht 6\' 2"  (1.88 m)  Wt 175 lb (79.379 kg)  BMI 22.46 kg/m2  SpO2 98% Physical Exam  Constitutional: He  appears well-developed and well-nourished. No distress.  HENT:  Head: Atraumatic.  Eyes: Conjunctivae are normal.  Neck: Normal range of motion. Neck supple.  Cardiovascular: Intact distal pulses.   Musculoskeletal: He exhibits tenderness (R knee: point tenderness to R medial joint line, no crepitus, no deformity, no overlying skin changes.  negative anterior/posterior drawer test.  pain with valgus/varus.  R hip/ankle nontender).  L knee, hip, ankle without focal tenderness or deformity.    Neurological: He is alert.  Skin: No rash noted.  Psychiatric: He has a normal mood and affect.    ED Course  Procedures (including critical care time)  12:24 PM Pt here with c/o R knee injury.  Focal point tenderness to R medial joint line without obvious trauma or overlying skin changes.  Xray ordered, pain medication given.  Pt is NVI.  History of polysubstance abuse.  1:42 PM Xray of R knee without acute abnormality, suspect a sprain but cannot r/o internal derangement of the knee.  Knee sleeve and crutches provided.  When asked in today, patient refused stating that he cannot bear any weight. As mentioned earlier, aside from point tenderness to medial right knee there is no overlying skin changes, no knee effusion, no signs of obvious trauma an x-ray without any acute fracture or dislocation. At this time I do not think narcotic pain medication is appropriate, will provide NSAIDs and Tylenol for pain, and close followup referral to orthopedic Dr. for further evaluation.  Labs Review Labs Reviewed - No data to display  Imaging Review Dg Knee Complete 4 Views Right  12/28/2013   CLINICAL DATA:  Right knee trauma and popping sensation  EXAM: RIGHT KNEE - COMPLETE 4+ VIEW  COMPARISON:  None.  FINDINGS: There is no evidence of fracture, dislocation, or joint effusion. There is no evidence of arthropathy or other focal bone abnormality. Soft tissues are unremarkable. Vascular calcifications are  identified.  IMPRESSION: Negative.   Electronically Signed   By: Christiana Pellant M.D.   On: 12/28/2013 13:18     EKG Interpretation None      MDM   Final diagnoses:  Fall at home, initial encounter  Right knee sprain, initial encounter    BP 160/104  Pulse 93  Temp(Src) 98.4 F (36.9 C) (Oral)  Resp 16  Ht 6\' 2"  (1.88 m)  Wt 175 lb (79.379 kg)  BMI 22.46 kg/m2  SpO2 100%  I have reviewed nursing notes and vital signs. I personally reviewed the imaging tests through PACS system  I reviewed available ER/hospitalization records thought the EMR     Fayrene Helper, PA-C 12/28/13 1350

## 2013-12-28 NOTE — ED Provider Notes (Signed)
Medical screening examination/treatment/procedure(s) were performed by non-physician practitioner and as supervising physician I was immediately available for consultation/collaboration.   EKG Interpretation None       Derwood Kaplan, MD 12/28/13 1743

## 2013-12-28 NOTE — Discharge Instructions (Signed)
Knee Sprain A knee sprain is a tear in the strong bands of tissue that connect the bones (ligaments) of your knee. HOME CARE  Raise (elevate) your injured knee to lessen puffiness (swelling).  To ease pain and puffiness, put ice on the injured area.  Put ice in a plastic bag.  Place a towel between your skin and the bag.  Leave the ice on for 20 minutes, 2-3 times a day.  Only take medicine as told by your doctor.  Do not leave your knee unprotected until pain and stiffness go away (usually 4-6 weeks).  If you have a cast or splint, do not get it wet. If your doctor told you to not take it off, cover it with a plastic bag when you shower or bathe. Do not swim.  Your doctor may have you do exercises to prevent or limit permanent weakness and stiffness. GET HELP RIGHT AWAY IF:   Your cast or splint becomes damaged.  Your pain gets worse.  You have a lot of pain, puffiness, or numbness below the cast or splint. MAKE SURE YOU:   Understand these instructions.  Will watch your condition.  Will get help right away if you are not doing well or get worse. Document Released: 03/30/2009 Document Revised: 04/16/2013 Document Reviewed: 12/18/2012 [facility] Patient Information 2015 [place], [place]. This information is not intended to replace advice given to you by your health care provider. Make sure you discuss any questions you have with your health care provider.   Elastic Bandage and RICE Elastic bandages come in different shapes and sizes. They perform different functions. Your caregiver will help you to decide what is best for your protection, recovery, or rehabilitation following an injury. The following are some general tips to help you use an elastic bandage.  Use the bandage as directed by the maker of the bandage you are using.  Do not wrap it too tight. This may cut off the circulation of the arm or leg below the bandage.  If part of your body beyond the bandage becomes  blue, numb, or swollen, it is too tight. Loosen the bandage as needed to prevent these problems.  See your caregiver or trainer if the bandage seems to be making your problems worse rather than better. Bandages may be a reminder to you that you have an injury. However, they provide very little support. The few pounds of support they provide are minor considering the pressure it takes to injure a joint or tear ligaments. Therefore, the joint will not be able to handle all of the wear and tear it could before the injury. The routine care of many injuries includes Rest, Ice, Compression, and Elevation (RICE).  Rest is required to allow your body to heal. Generally, routine activities can be resumed when comfortable. Injured tendons and bones take about 6 weeks to heal.  Icing the injury helps keep the swelling down and reduces pain. Do not apply ice directly to the skin. Put ice in a plastic bag. Place a towel between the skin and the bag. This will prevent frostbite to the skin. Apply ice bags to the injured area for 15-20 minutes, every 2 hours while awake. Do this for the first 24 to 48 hours, then as directed by your caregiver.  Compression helps keep swelling down, gives support, and helps with discomfort. If an elastic bandage has been applied today, it should be removed and reapplied every 3 to 4 hours. It should not be applied tightly,  but firmly enough to keep swelling down. Watch fingers or toes for swelling, bluish discoloration, coldness, numbness, or increased pain. If any of these problems occur, remove the bandage and reapply it more loosely. If these problems persist, contact your caregiver.  Elevation helps reduce swelling and decreases pain. The injured area (arms, hands, legs, or feet) should be placed near to or above the heart (center of the chest) if able. Persistent pain and inability to use the injured area for more than 2 to 3 days are warning signs. You should see a caregiver for  a follow-up visit as soon as possible. Initially, a minor broken bone (hairline fracture) may not be seen on X-rays. It may take 7 to 10 days to finally show up. Continued pain and swelling show that further evaluation and/or X-rays are needed. Make a follow-up visit with your caregiver. A specialist in reading X-rays (radiologist) will read your X-rays again. Finding out the results of your test Not all test results are available during your visit. If your test results are not back during the visit, make an appointment with your caregiver to find out the results. Do not assume everything is normal if you have not heard from your caregiver or the medical facility. It is important for you to follow up on all of your test results. Document Released: 10/01/2001 Document Revised: 07/04/2011 Document Reviewed: 08/13/2007 [facility] Patient Information 2015 [place], [place]. This information is not intended to replace advice given to you by your health care provider. Make sure you discuss any questions you have with your health care provider.  Crutch Use Crutches take weight off one of your legs or feet when you stand or walk. It is important to use crutches that fit right. Your crutches fit right if:  You can fit 2-3 fingers between your armpit and the crutch.  You use your hands, not your armpits, to hold yourself up. Do not put your armpits on the crutches. This can damage the nerves in your hands and arms. Crutches should be a little below your armpits. HOW TO USE YOUR CRUTCHES Walking 1. Step with the crutches. 2. Swing the good leg a little bit in front of the crutches. Going Up Steps If there is no handrail: 1. Step up with the good leg. 2. Step up with the crutches and hurt leg. 3. Continue in this way. If there is a handrail: 1. Hold both crutches in one hand. 2. Place your free hand on the handrail. 3. Put your weight on your arms and lift your good leg to the step. 4. Bring the crutches  and the hurt leg up to that step. 5. Continue in this way. Going Down Steps Be very careful, as going down stairs with crutches is very challenging. If there is no handrail: 1. Step down with the hurt leg and crutches. 2. Step down with the good leg. If there is a handrail: 1. Place your hand on the handrail. 2. Hold both crutches with your free hand. 3. Lower your hurt leg and crutch to the step below you. Make sure to keep the crutch tips in the center of the step, never on the edge. 4. Lower your good leg to that step. 5. Continue in this way. Standing Up 1. Hold the hurt leg forward. 2. Grab the armrest with one hand and the top of the crutches with the other hand. 3. Pull yourself up to a standing position. Sitting Down 1. Hold the hurt leg forward. 2.  Grab the armrest with one hand and the top of the crutches with the other hand. 3.  Lower yourself to a sitting position. GET HELP IF:  You still feel wobbly on your feet.  You develop new pain, for example in your armpits, back, shoulder, wrist, or hip.  You cannot feel a part of your body (numb).  You have tingling. GET HELP RIGHT AWAY IF: You fall. Document Released: 09/28/2007 Document Revised: 01/30/2013 Document Reviewed: 12/17/2012 [facility] Patient Information 2015 [place], [place]. This information is not intended to replace advice given to you by your health care provider. Make sure you discuss any questions you have with your health care provider.

## 2013-12-28 NOTE — ED Notes (Signed)
Patient returned from X-ray 

## 2013-12-28 NOTE — ED Notes (Signed)
Applied knee sleeve; patient very resistant to attempting to ambulate. Encouraged attempting. Patient stood up, put full weight on left leg, put partial weight on right leg and then sat back down stating "I can't, it hurts too bad."

## 2014-02-20 ENCOUNTER — Inpatient Hospital Stay (HOSPITAL_COMMUNITY): Payer: Medicare Other

## 2014-02-20 ENCOUNTER — Emergency Department (HOSPITAL_COMMUNITY): Payer: Medicare Other

## 2014-02-20 ENCOUNTER — Inpatient Hospital Stay (HOSPITAL_COMMUNITY)
Admission: EM | Admit: 2014-02-20 | Discharge: 2014-02-27 | DRG: 177 | Disposition: A | Discharge status: Skilled Nursing Facility | Payer: Medicare Other | Attending: Internal Medicine | Admitting: Internal Medicine

## 2014-02-20 ENCOUNTER — Encounter (HOSPITAL_COMMUNITY): Payer: Self-pay | Admitting: Emergency Medicine

## 2014-02-20 DIAGNOSIS — F10231 Alcohol dependence with withdrawal delirium: Secondary | ICD-10-CM | POA: Diagnosis not present

## 2014-02-20 DIAGNOSIS — I5043 Acute on chronic combined systolic (congestive) and diastolic (congestive) heart failure: Secondary | ICD-10-CM | POA: Diagnosis present

## 2014-02-20 DIAGNOSIS — R748 Abnormal levels of other serum enzymes: Secondary | ICD-10-CM | POA: Diagnosis not present

## 2014-02-20 DIAGNOSIS — F17210 Nicotine dependence, cigarettes, uncomplicated: Secondary | ICD-10-CM | POA: Diagnosis present

## 2014-02-20 DIAGNOSIS — E876 Hypokalemia: Secondary | ICD-10-CM

## 2014-02-20 DIAGNOSIS — J690 Pneumonitis due to inhalation of food and vomit: Principal | ICD-10-CM | POA: Diagnosis present

## 2014-02-20 DIAGNOSIS — J9600 Acute respiratory failure, unspecified whether with hypoxia or hypercapnia: Secondary | ICD-10-CM | POA: Diagnosis present

## 2014-02-20 DIAGNOSIS — E785 Hyperlipidemia, unspecified: Secondary | ICD-10-CM | POA: Diagnosis not present

## 2014-02-20 DIAGNOSIS — E8729 Other acidosis: Secondary | ICD-10-CM | POA: Diagnosis present

## 2014-02-20 DIAGNOSIS — R768 Other specified abnormal immunological findings in serum: Secondary | ICD-10-CM | POA: Diagnosis present

## 2014-02-20 DIAGNOSIS — K761 Chronic passive congestion of liver: Secondary | ICD-10-CM | POA: Diagnosis not present

## 2014-02-20 DIAGNOSIS — I2510 Atherosclerotic heart disease of native coronary artery without angina pectoris: Secondary | ICD-10-CM | POA: Diagnosis not present

## 2014-02-20 DIAGNOSIS — R7401 Elevation of levels of liver transaminase levels: Secondary | ICD-10-CM

## 2014-02-20 DIAGNOSIS — R740 Nonspecific elevation of levels of transaminase and lactic acid dehydrogenase [LDH]: Secondary | ICD-10-CM | POA: Diagnosis not present

## 2014-02-20 DIAGNOSIS — Z9861 Coronary angioplasty status: Secondary | ICD-10-CM

## 2014-02-20 DIAGNOSIS — F329 Major depressive disorder, single episode, unspecified: Secondary | ICD-10-CM | POA: Diagnosis not present

## 2014-02-20 DIAGNOSIS — E872 Acidosis: Secondary | ICD-10-CM | POA: Diagnosis present

## 2014-02-20 DIAGNOSIS — E875 Hyperkalemia: Secondary | ICD-10-CM | POA: Diagnosis present

## 2014-02-20 DIAGNOSIS — Z791 Long term (current) use of non-steroidal anti-inflammatories (NSAID): Secondary | ICD-10-CM | POA: Diagnosis not present

## 2014-02-20 DIAGNOSIS — K2920 Alcoholic gastritis without bleeding: Secondary | ICD-10-CM | POA: Diagnosis present

## 2014-02-20 DIAGNOSIS — K859 Acute pancreatitis, unspecified: Secondary | ICD-10-CM | POA: Diagnosis present

## 2014-02-20 DIAGNOSIS — Z23 Encounter for immunization: Secondary | ICD-10-CM

## 2014-02-20 DIAGNOSIS — E118 Type 2 diabetes mellitus with unspecified complications: Secondary | ICD-10-CM

## 2014-02-20 DIAGNOSIS — I420 Dilated cardiomyopathy: Secondary | ICD-10-CM | POA: Diagnosis present

## 2014-02-20 DIAGNOSIS — I5022 Chronic systolic (congestive) heart failure: Secondary | ICD-10-CM

## 2014-02-20 DIAGNOSIS — Y901 Blood alcohol level of 20-39 mg/100 ml: Secondary | ICD-10-CM | POA: Diagnosis present

## 2014-02-20 DIAGNOSIS — E871 Hypo-osmolality and hyponatremia: Secondary | ICD-10-CM | POA: Diagnosis present

## 2014-02-20 DIAGNOSIS — E46 Unspecified protein-calorie malnutrition: Secondary | ICD-10-CM | POA: Diagnosis not present

## 2014-02-20 DIAGNOSIS — M25561 Pain in right knee: Secondary | ICD-10-CM

## 2014-02-20 DIAGNOSIS — Z7982 Long term (current) use of aspirin: Secondary | ICD-10-CM

## 2014-02-20 DIAGNOSIS — I252 Old myocardial infarction: Secondary | ICD-10-CM

## 2014-02-20 DIAGNOSIS — Z6821 Body mass index (BMI) 21.0-21.9, adult: Secondary | ICD-10-CM

## 2014-02-20 DIAGNOSIS — K7010 Alcoholic hepatitis without ascites: Secondary | ICD-10-CM | POA: Diagnosis not present

## 2014-02-20 DIAGNOSIS — E11649 Type 2 diabetes mellitus with hypoglycemia without coma: Secondary | ICD-10-CM | POA: Diagnosis not present

## 2014-02-20 DIAGNOSIS — F10939 Alcohol use, unspecified with withdrawal, unspecified: Secondary | ICD-10-CM

## 2014-02-20 DIAGNOSIS — Z91148 Patient's other noncompliance with medication regimen for other reason: Secondary | ICD-10-CM

## 2014-02-20 DIAGNOSIS — Z79891 Long term (current) use of opiate analgesic: Secondary | ICD-10-CM

## 2014-02-20 DIAGNOSIS — Z8249 Family history of ischemic heart disease and other diseases of the circulatory system: Secondary | ICD-10-CM

## 2014-02-20 DIAGNOSIS — Z9114 Patient's other noncompliance with medication regimen: Secondary | ICD-10-CM | POA: Diagnosis present

## 2014-02-20 DIAGNOSIS — I5041 Acute combined systolic (congestive) and diastolic (congestive) heart failure: Secondary | ICD-10-CM

## 2014-02-20 DIAGNOSIS — J9601 Acute respiratory failure with hypoxia: Secondary | ICD-10-CM | POA: Diagnosis present

## 2014-02-20 DIAGNOSIS — I426 Alcoholic cardiomyopathy: Secondary | ICD-10-CM | POA: Diagnosis present

## 2014-02-20 DIAGNOSIS — I428 Other cardiomyopathies: Secondary | ICD-10-CM

## 2014-02-20 DIAGNOSIS — J449 Chronic obstructive pulmonary disease, unspecified: Secondary | ICD-10-CM | POA: Diagnosis present

## 2014-02-20 DIAGNOSIS — F10239 Alcohol dependence with withdrawal, unspecified: Secondary | ICD-10-CM | POA: Insufficient documentation

## 2014-02-20 DIAGNOSIS — R0600 Dyspnea, unspecified: Secondary | ICD-10-CM

## 2014-02-20 DIAGNOSIS — Z79899 Other long term (current) drug therapy: Secondary | ICD-10-CM

## 2014-02-20 DIAGNOSIS — I10 Essential (primary) hypertension: Secondary | ICD-10-CM | POA: Diagnosis not present

## 2014-02-20 DIAGNOSIS — I110 Hypertensive heart disease with heart failure: Secondary | ICD-10-CM | POA: Diagnosis present

## 2014-02-20 DIAGNOSIS — R112 Nausea with vomiting, unspecified: Secondary | ICD-10-CM | POA: Diagnosis present

## 2014-02-20 DIAGNOSIS — I471 Supraventricular tachycardia: Secondary | ICD-10-CM | POA: Diagnosis not present

## 2014-02-20 DIAGNOSIS — Z9119 Patient's noncompliance with other medical treatment and regimen: Secondary | ICD-10-CM | POA: Diagnosis not present

## 2014-02-20 DIAGNOSIS — Z833 Family history of diabetes mellitus: Secondary | ICD-10-CM | POA: Diagnosis not present

## 2014-02-20 DIAGNOSIS — F1020 Alcohol dependence, uncomplicated: Secondary | ICD-10-CM

## 2014-02-20 DIAGNOSIS — G8929 Other chronic pain: Secondary | ICD-10-CM | POA: Diagnosis not present

## 2014-02-20 DIAGNOSIS — D61818 Other pancytopenia: Secondary | ICD-10-CM | POA: Diagnosis present

## 2014-02-20 DIAGNOSIS — I5023 Acute on chronic systolic (congestive) heart failure: Secondary | ICD-10-CM | POA: Diagnosis present

## 2014-02-20 DIAGNOSIS — F10129 Alcohol abuse with intoxication, unspecified: Secondary | ICD-10-CM

## 2014-02-20 LAB — COMPREHENSIVE METABOLIC PANEL
ALBUMIN: 2.8 g/dL — AB (ref 3.5–5.2)
ALT: 120 U/L — ABNORMAL HIGH (ref 0–53)
ANION GAP: 21 — AB (ref 5–15)
AST: 285 U/L — ABNORMAL HIGH (ref 0–37)
Alkaline Phosphatase: 98 U/L (ref 39–117)
BUN: 7 mg/dL (ref 6–23)
CALCIUM: 7.8 mg/dL — AB (ref 8.4–10.5)
CO2: 21 mEq/L (ref 19–32)
CREATININE: 0.6 mg/dL (ref 0.50–1.35)
Chloride: 98 mEq/L (ref 96–112)
GFR calc non Af Amer: >90 mL/min (ref >90)
GLUCOSE: 192 mg/dL — AB (ref 70–99)
Potassium: 3.4 mEq/L — ABNORMAL LOW (ref 3.7–5.3)
Sodium: 140 mEq/L (ref 137–147)
TOTAL PROTEIN: 7.3 g/dL (ref 6.0–8.3)
Total Bilirubin: 2 mg/dL — ABNORMAL HIGH (ref 0.3–1.2)

## 2014-02-20 LAB — BLOOD GAS, ARTERIAL
Acid-base deficit: 2.1 mmol/L — ABNORMAL HIGH (ref 0.0–2.0)
BICARBONATE: 19.9 meq/L — AB (ref 20.0–24.0)
DRAWN BY: 331471
O2 Content: 6 L/min
O2 SAT: 89.7 %
PO2 ART: 58.1 mmHg — AB (ref 80.0–100.0)
Patient temperature: 98.6
TCO2: 17.6 mmol/L (ref 0–100)
pCO2 arterial: 27.1 mmHg — ABNORMAL LOW (ref 35.0–45.0)
pH, Arterial: 7.479 — ABNORMAL HIGH (ref 7.350–7.450)

## 2014-02-20 LAB — RAPID URINE DRUG SCREEN, HOSP PERFORMED
Amphetamines: NOT DETECTED
Barbiturates: NOT DETECTED
Benzodiazepines: NOT DETECTED
Cocaine: NOT DETECTED
OPIATES: POSITIVE — AB
Tetrahydrocannabinol: POSITIVE — AB

## 2014-02-20 LAB — URINALYSIS, ROUTINE W REFLEX MICROSCOPIC
Glucose, UA: 250 mg/dL — AB
Ketones, ur: 15 mg/dL — AB
Leukocytes, UA: NEGATIVE
Nitrite: NEGATIVE
Protein, ur: 100 mg/dL — AB
SPECIFIC GRAVITY, URINE: 1.019 (ref 1.005–1.030)
Urobilinogen, UA: 2 mg/dL — ABNORMAL HIGH (ref 0.0–1.0)
pH: 6 (ref 5.0–8.0)

## 2014-02-20 LAB — LIPASE, BLOOD: LIPASE: 103 U/L — AB (ref 11–59)

## 2014-02-20 LAB — BLOOD GAS, VENOUS
Acid-base deficit: 1.7 mmol/L (ref 0.0–2.0)
Bicarbonate: 21.8 mEq/L (ref 20.0–24.0)
O2 Saturation: 35.8 %
PATIENT TEMPERATURE: 98.6
PCO2 VEN: 34.6 mmHg — AB (ref 45.0–50.0)
PH VEN: 7.416 — AB (ref 7.250–7.300)
TCO2: 20.1 mmol/L (ref 0–100)

## 2014-02-20 LAB — TROPONIN I

## 2014-02-20 LAB — CBC WITH DIFFERENTIAL/PLATELET
Basophils Absolute: 0 10*3/uL (ref 0.0–0.1)
Basophils Relative: 0 % (ref 0–1)
EOS ABS: 0 10*3/uL (ref 0.0–0.7)
EOS PCT: 0 % (ref 0–5)
HCT: 34.7 % — ABNORMAL LOW (ref 39.0–52.0)
HEMOGLOBIN: 12.5 g/dL — AB (ref 13.0–17.0)
LYMPHS ABS: 0.8 10*3/uL (ref 0.7–4.0)
Lymphocytes Relative: 30 % (ref 12–46)
MCH: 32.4 pg (ref 26.0–34.0)
MCHC: 36 g/dL (ref 30.0–36.0)
MCV: 89.9 fL (ref 78.0–100.0)
MONOS PCT: 11 % (ref 3–12)
Monocytes Absolute: 0.3 10*3/uL (ref 0.1–1.0)
NEUTROS PCT: 59 % (ref 43–77)
Neutro Abs: 1.6 10*3/uL — ABNORMAL LOW (ref 1.7–7.7)
Platelets: 158 10*3/uL (ref 150–400)
RBC: 3.86 MIL/uL — AB (ref 4.22–5.81)
RDW: 13.5 % (ref 11.5–15.5)
WBC: 2.8 10*3/uL — ABNORMAL LOW (ref 4.0–10.5)

## 2014-02-20 LAB — HEMOGLOBIN A1C
HEMOGLOBIN A1C: 7.3 % — AB (ref <5.7)
Mean Plasma Glucose: 163 mg/dL — ABNORMAL HIGH (ref <117)

## 2014-02-20 LAB — URINE MICROSCOPIC-ADD ON

## 2014-02-20 LAB — PROTIME-INR
INR: 1.15 (ref 0.00–1.49)
Prothrombin Time: 14.8 seconds (ref 11.6–15.2)

## 2014-02-20 LAB — APTT: aPTT: 30 seconds (ref 24–37)

## 2014-02-20 LAB — MAGNESIUM: Magnesium: 1.3 mg/dL — ABNORMAL LOW (ref 1.5–2.5)

## 2014-02-20 LAB — ETHANOL: ALCOHOL ETHYL (B): 31 mg/dL — AB (ref 0–11)

## 2014-02-20 LAB — OCCULT BLOOD GASTRIC / DUODENUM (SPECIMEN CUP)
OCCULT BLOOD, GASTRIC: NEGATIVE
PH, GASTRIC: 2

## 2014-02-20 LAB — URIC ACID: Uric Acid, Serum: 7.1 mg/dL (ref 4.0–7.8)

## 2014-02-20 LAB — MRSA PCR SCREENING: MRSA by PCR: NEGATIVE

## 2014-02-20 LAB — PRO B NATRIURETIC PEPTIDE: PRO B NATRI PEPTIDE: 5707 pg/mL — AB (ref 0–125)

## 2014-02-20 LAB — GLUCOSE, CAPILLARY: Glucose-Capillary: 272 mg/dL — ABNORMAL HIGH (ref 70–99)

## 2014-02-20 MED ORDER — FOLIC ACID 1 MG PO TABS
1.0000 mg | ORAL_TABLET | Freq: Every day | ORAL | Status: DC
  Administered 2014-02-20 – 2014-02-27 (×8): 1 mg via ORAL
  Filled 2014-02-20 (×8): qty 1

## 2014-02-20 MED ORDER — ACETAMINOPHEN 650 MG RE SUPP: 650.0000 mg | Freq: Four times a day (QID) | RECTAL | Status: DC | PRN

## 2014-02-20 MED ORDER — LISINOPRIL 10 MG PO TABS
20.0000 mg | ORAL_TABLET | Freq: Every day | ORAL | Status: DC
  Administered 2014-02-20 – 2014-02-23 (×4): 20 mg via ORAL
  Filled 2014-02-20 (×5): qty 2

## 2014-02-20 MED ORDER — ACETAMINOPHEN 325 MG PO TABS: 650.0000 mg | ORAL_TABLET | Freq: Four times a day (QID) | ORAL | Status: DC | PRN

## 2014-02-20 MED ORDER — MAGNESIUM SULFATE 40 MG/ML IJ SOLN
2.0000 g | Freq: Once | INTRAMUSCULAR | Status: AC
  Administered 2014-02-20: 2 g via INTRAVENOUS
  Filled 2014-02-20: qty 50

## 2014-02-20 MED ORDER — ONDANSETRON HCL 4 MG/2ML IJ SOLN
4.0000 mg | Freq: Once | INTRAMUSCULAR | Status: AC
  Administered 2014-02-20: 4 mg via INTRAVENOUS
  Filled 2014-02-20: qty 2

## 2014-02-20 MED ORDER — FUROSEMIDE 10 MG/ML IJ SOLN
40.0000 mg | Freq: Once | INTRAMUSCULAR | Status: AC
  Administered 2014-02-20: 40 mg via INTRAVENOUS
  Filled 2014-02-20: qty 4

## 2014-02-20 MED ORDER — PIPERACILLIN-TAZOBACTAM 3.375 G IVPB 30 MIN
3.3750 g | Freq: Once | INTRAVENOUS | Status: AC
  Administered 2014-02-20: 3.375 g via INTRAVENOUS

## 2014-02-20 MED ORDER — PIPERACILLIN-TAZOBACTAM 3.375 G IVPB 30 MIN: 3.3750 g | Freq: Three times a day (TID) | INTRAVENOUS | Status: DC

## 2014-02-20 MED ORDER — LORAZEPAM 2 MG/ML IJ SOLN
1.0000 mg | Freq: Once | INTRAMUSCULAR | Status: AC
  Administered 2014-02-20: 1 mg via INTRAVENOUS
  Filled 2014-02-20: qty 1

## 2014-02-20 MED ORDER — THIAMINE HCL 100 MG/ML IJ SOLN
100.0000 mg | Freq: Every day | INTRAMUSCULAR | Status: DC
  Administered 2014-02-21: 100 mg via INTRAVENOUS
  Filled 2014-02-20: qty 2
  Filled 2014-02-20 (×3): qty 1

## 2014-02-20 MED ORDER — LORAZEPAM 2 MG/ML IJ SOLN
1.0000 mg | Freq: Four times a day (QID) | INTRAMUSCULAR | Status: AC | PRN
  Administered 2014-02-21 – 2014-02-22 (×2): 1 mg via INTRAVENOUS
  Filled 2014-02-20: qty 1

## 2014-02-20 MED ORDER — THIAMINE HCL 100 MG/ML IJ SOLN
Freq: Once | INTRAVENOUS | Status: AC
  Administered 2014-02-20: 13:00:00 via INTRAVENOUS
  Filled 2014-02-20: qty 1000

## 2014-02-20 MED ORDER — INSULIN GLARGINE 100 UNIT/ML ~~LOC~~ SOLN
15.0000 [IU] | Freq: Every day | SUBCUTANEOUS | Status: DC
  Administered 2014-02-20 – 2014-02-24 (×5): 15 [IU] via SUBCUTANEOUS
  Filled 2014-02-20 (×6): qty 0.15

## 2014-02-20 MED ORDER — HYDROCODONE-ACETAMINOPHEN 5-325 MG PO TABS: 1.0000 | ORAL_TABLET | Freq: Once | ORAL | Status: DC

## 2014-02-20 MED ORDER — ENOXAPARIN SODIUM 40 MG/0.4ML ~~LOC~~ SOLN
40.0000 mg | SUBCUTANEOUS | Status: DC
  Administered 2014-02-20 – 2014-02-26 (×7): 40 mg via SUBCUTANEOUS
  Filled 2014-02-20 (×8): qty 0.4

## 2014-02-20 MED ORDER — IPRATROPIUM-ALBUTEROL 0.5-2.5 (3) MG/3ML IN SOLN
3.0000 mL | Freq: Once | RESPIRATORY_TRACT | Status: AC
  Administered 2014-02-20: 3 mL via RESPIRATORY_TRACT
  Filled 2014-02-20: qty 3

## 2014-02-20 MED ORDER — VITAMIN B-1 100 MG PO TABS
100.0000 mg | ORAL_TABLET | Freq: Every day | ORAL | Status: DC
  Administered 2014-02-20 – 2014-02-27 (×7): 100 mg via ORAL
  Filled 2014-02-20 (×8): qty 1

## 2014-02-20 MED ORDER — HYDROCODONE-ACETAMINOPHEN 5-325 MG PO TABS: ORAL_TABLET | ORAL | Status: DC

## 2014-02-20 MED ORDER — INFLUENZA VAC SPLIT QUAD 0.5 ML IM SUSY
0.5000 mL | PREFILLED_SYRINGE | INTRAMUSCULAR | Status: AC
  Administered 2014-02-21: 0.5 mL via INTRAMUSCULAR
  Filled 2014-02-20 (×2): qty 0.5

## 2014-02-20 MED ORDER — PANTOPRAZOLE SODIUM 40 MG PO TBEC
40.0000 mg | DELAYED_RELEASE_TABLET | Freq: Two times a day (BID) | ORAL | Status: DC
  Administered 2014-02-20: 40 mg via ORAL
  Filled 2014-02-20: qty 1

## 2014-02-20 MED ORDER — IPRATROPIUM-ALBUTEROL 0.5-2.5 (3) MG/3ML IN SOLN
3.0000 mL | Freq: Three times a day (TID) | RESPIRATORY_TRACT | Status: DC
  Administered 2014-02-21 – 2014-02-24 (×10): 3 mL via RESPIRATORY_TRACT
  Filled 2014-02-20 (×10): qty 3

## 2014-02-20 MED ORDER — ASPIRIN EC 81 MG PO TBEC
81.0000 mg | DELAYED_RELEASE_TABLET | Freq: Every day | ORAL | Status: DC
  Administered 2014-02-21 – 2014-02-27 (×7): 81 mg via ORAL
  Filled 2014-02-20 (×8): qty 1

## 2014-02-20 MED ORDER — BUPROPION HCL ER (XL) 150 MG PO TB24
150.0000 mg | ORAL_TABLET | Freq: Every day | ORAL | Status: DC
  Administered 2014-02-20 – 2014-02-27 (×8): 150 mg via ORAL
  Filled 2014-02-20 (×8): qty 1

## 2014-02-20 MED ORDER — PRAVASTATIN SODIUM 10 MG PO TABS
10.0000 mg | ORAL_TABLET | Freq: Every day | ORAL | Status: DC
  Administered 2014-02-20 – 2014-02-22 (×3): 10 mg via ORAL
  Filled 2014-02-20 (×3): qty 1

## 2014-02-20 MED ORDER — IPRATROPIUM-ALBUTEROL 0.5-2.5 (3) MG/3ML IN SOLN
3.0000 mL | RESPIRATORY_TRACT | Status: DC
  Administered 2014-02-20: 3 mL via RESPIRATORY_TRACT
  Filled 2014-02-20: qty 3

## 2014-02-20 MED ORDER — LORAZEPAM 1 MG PO TABS
1.0000 mg | ORAL_TABLET | Freq: Four times a day (QID) | ORAL | Status: AC | PRN
  Administered 2014-02-21 – 2014-02-22 (×3): 1 mg via ORAL
  Filled 2014-02-20 (×3): qty 1

## 2014-02-20 MED ORDER — INSULIN ASPART 100 UNIT/ML ~~LOC~~ SOLN
0.0000 [IU] | Freq: Three times a day (TID) | SUBCUTANEOUS | Status: DC
  Administered 2014-02-21 (×2): 2 [IU] via SUBCUTANEOUS
  Administered 2014-02-21 – 2014-02-22 (×3): 1 [IU] via SUBCUTANEOUS
  Administered 2014-02-22: 3 [IU] via SUBCUTANEOUS
  Administered 2014-02-23: 1 [IU] via SUBCUTANEOUS
  Administered 2014-02-23: 2 [IU] via SUBCUTANEOUS
  Administered 2014-02-24 – 2014-02-27 (×4): 1 [IU] via SUBCUTANEOUS

## 2014-02-20 MED ORDER — PIPERACILLIN-TAZOBACTAM 3.375 G IVPB
3.3750 g | Freq: Three times a day (TID) | INTRAVENOUS | Status: DC
  Administered 2014-02-21 – 2014-02-24 (×10): 3.375 g via INTRAVENOUS
  Filled 2014-02-20 (×11): qty 50

## 2014-02-20 MED ORDER — LORAZEPAM 2 MG/ML IJ SOLN
0.0000 mg | Freq: Two times a day (BID) | INTRAMUSCULAR | Status: DC
  Administered 2014-02-22 – 2014-02-23 (×2): 2 mg via INTRAVENOUS
  Filled 2014-02-20 (×2): qty 1

## 2014-02-20 MED ORDER — MORPHINE SULFATE 4 MG/ML IJ SOLN
4.0000 mg | Freq: Once | INTRAMUSCULAR | Status: AC
Start: 2014-02-20 — End: 2014-02-20
  Administered 2014-02-20: 4 mg via INTRAMUSCULAR
  Filled 2014-02-20: qty 1

## 2014-02-20 MED ORDER — SODIUM CHLORIDE 0.9 % IV BOLUS (SEPSIS)
1000.0000 mL | Freq: Once | INTRAVENOUS | Status: AC
  Administered 2014-02-20: 1000 mL via INTRAVENOUS

## 2014-02-20 MED ORDER — ADULT MULTIVITAMIN W/MINERALS CH
1.0000 | ORAL_TABLET | Freq: Every day | ORAL | Status: DC
  Administered 2014-02-20 – 2014-02-27 (×8): 1 via ORAL
  Filled 2014-02-20 (×8): qty 1

## 2014-02-20 MED ORDER — OXYCODONE HCL 5 MG PO TABS
5.0000 mg | ORAL_TABLET | ORAL | Status: DC | PRN
  Administered 2014-02-20 – 2014-02-27 (×8): 5 mg via ORAL
  Filled 2014-02-20 (×8): qty 1

## 2014-02-20 MED ORDER — TRAZODONE HCL 100 MG PO TABS
100.0000 mg | ORAL_TABLET | Freq: Every day | ORAL | Status: DC
  Administered 2014-02-20 – 2014-02-26 (×7): 100 mg via ORAL
  Filled 2014-02-20: qty 1
  Filled 2014-02-20: qty 2
  Filled 2014-02-20: qty 1
  Filled 2014-02-20 (×3): qty 2
  Filled 2014-02-20 (×2): qty 1

## 2014-02-20 MED ORDER — SODIUM CHLORIDE 0.9 % IJ SOLN
3.0000 mL | Freq: Two times a day (BID) | INTRAMUSCULAR | Status: DC
  Administered 2014-02-20 – 2014-02-27 (×7): 3 mL via INTRAVENOUS

## 2014-02-20 MED ORDER — LORAZEPAM 2 MG/ML IJ SOLN
0.0000 mg | Freq: Four times a day (QID) | INTRAMUSCULAR | Status: AC
  Administered 2014-02-21 (×2): 1 mg via INTRAVENOUS
  Administered 2014-02-22 (×2): 2 mg via INTRAVENOUS
  Filled 2014-02-20 (×5): qty 1

## 2014-02-20 MED ORDER — SERTRALINE HCL 100 MG PO TABS
100.0000 mg | ORAL_TABLET | Freq: Every day | ORAL | Status: DC
  Administered 2014-02-20 – 2014-02-26 (×7): 100 mg via ORAL
  Filled 2014-02-20 (×8): qty 1

## 2014-02-20 NOTE — Progress Notes (Signed)
  CARE MANAGEMENT ED NOTE 02/20/2014  Patient:  [name], [name]   Account Number:  [id number]  Date Initiated:  02/20/2014  Documentation initiated by:  Radford Pax  Subjective/Objective Assessment:   Patient presents to ED with pain to right knee post fall three months ago.  Patient reports increased pain to right knee     Subjective/Objective Assessment Detail:   Patient with pmhx of DM, HTN, CAD, MI, mental disorder, deprssion     Action/Plan:   Action/Plan Detail:   Anticipated DC Date:       Status Recommendation to Physician:   Result of Recommendation:    Other ED Services  Consult Working Plan    DC Planning Services  Other  PCP issues    Choice offered to / List presented to:            Status of service:  Completed, signed off  ED Comments:   ED Comments Detail:  EDCM spoke to patient at bedside.  Patient reports his pcp is at [facility] in [place].  Patient unable to tell [facility] pcp name as patient reports, "I haven't been there is a long time."  Patient confirms he gets his medications for free through the [facility].  Patient reports , "I used to get my medications delivered to my house but I stopped ordering them because I went on a drunk."  Patient noted to be wearing oxygen in the ED, patient does NOT wear oxygen at home.  SW consult placed for alcohol abuse.  Encouraged patient to make a follow up appointment with his pcp at the [facility] so that he may get his medications reodered and delivered to his home.  Patient verbalized understanding. No further EDCM needs at this time.

## 2014-02-20 NOTE — ED Notes (Signed)
Pt resting in bed.  CIWA score of 2.  Pt states he feels tired since first dose of ativan.   O2 at 2L/[place] placed for RA sats of 88%.  Sats up to 90% at 2L.  O2 increased to 3L/[place]

## 2014-02-20 NOTE — ED Provider Notes (Signed)
CSN: [id number]     Arrival date & time 02/20/14  [id number] History   First MD Initiated Contact with Patient 02/20/14 1011     Chief Complaint  Patient presents with  . Knee Pain     (Consider location/radiation/quality/duration/timing/severity/associated sxs/prior Treatment) HPI  Baird CancerJerry W [name] is a 58 y.o. male complaining of severe atraumatic right knee pain starting 3 days ago. Patient states he had a slip and fall 3 months ago and it's been bothering him a little bit since then but not severely. He has taken acetaminophen at home with little relief. He denies any recent trauma, reduced range of motion, numbness, tingling, paresthesia, severe trauma or surgery to the affected joint. Pain is 8 out of 10, aching and sharp, exacerbated by movement and weightbearing.   Patient has been noncompliant with any of his medications including insulin for 2 months. States that he is been drinking and noncompliant with his meds. Patient denies chest pain, abdominal pain, nausea, vomiting, change in bowel or bladder habits, headache, change in vision, cervicalgia, dysarthria, ataxia, suicidal ideation, homicidal ideation, auditory or visual hallucinations. States that he drinks beer daily, last alcohol intake was yesterday. He denies any history of DTs or seizures from alcohol withdrawal.  Past Medical History  Diagnosis Date  . Diabetes mellitus   . Hypertension   . Coronary artery disease   . MI (myocardial infarction) 261984    age 58 per patient  . Mental disorder   . Depression    Past Surgical History  Procedure Laterality Date  . Angioplasty  1984    at age 58   History reviewed. No pertinent family history. History  Substance Use Topics  . Smoking status: Current Every Day Smoker -- 0.50 packs/day    Types: Cigarettes  . Smokeless tobacco: Never Used  . Alcohol Use: 1.2 oz/week    2 Cans of beer per week     Comment: drinking daily (2 40oz beer a day)    Review of Systems  10  systems reviewed and found to be negative, except as noted in the HPI.   Allergies  Review of patient's allergies indicates no known allergies.  Home Medications   Prior to Admission medications   Medication Sig Start Date End Date Taking? Authorizing Provider  aspirin 81 MG EC tablet Take 1 tablet (81 mg total) by mouth daily. For heart health 12/27/12   Sanjuana KavaAgnes I Nwoko, NP  buPROPion (WELLBUTRIN XL) 150 MG 24 hr tablet Take 1 tablet (150 mg total) by mouth daily. For depression 12/27/12   Sanjuana KavaAgnes I Nwoko, NP  furosemide (LASIX) 20 MG tablet Take 0.5 tablets (10 mg total) by mouth daily. 12/31/12   Fransisca KaufmannLaura Davis, NP  HYDROcodone-acetaminophen (NORCO/VICODIN) 5-325 MG per tablet Take 1-2 tablets by mouth every 6 hours as needed for pain. 02/20/14   Ever Gustafson, PA-C  insulin glargine (LANTUS) 100 UNIT/ML injection Inject 0.3 mLs (30 Units total) into the skin at bedtime. For high blood sugar control 12/27/12   Sanjuana KavaAgnes I Nwoko, NP  lisinopril (PRINIVIL,ZESTRIL) 20 MG tablet Take 20 mg by mouth daily.    Historical Provider, MD  metFORMIN (GLUCOPHAGE) 1000 MG tablet Take 0.5 tablets (500 mg total) by mouth 2 (two) times daily with a meal. For high blood sugar control 12/27/12   Sanjuana KavaAgnes I Nwoko, NP  naproxen (NAPROSYN) 500 MG tablet Take 1 tablet (500 mg total) by mouth 2 (two) times daily. 12/28/13   Fayrene HelperBowie Tran, PA-C  potassium chloride (K-DUR) 10  MEQ tablet Take 1 tablet (10 mEq total) by mouth daily. For low potassium supplement 12/27/12   Sanjuana Kava, NP  pravastatin (PRAVACHOL) 10 MG tablet Take 1 tablet (10 mg total) by mouth daily. For high cholesterol control 12/27/12   Sanjuana Kava, NP  sertraline (ZOLOFT) 100 MG tablet Take 1 tablet (100 mg total) by mouth at bedtime. For depression 12/27/12   Sanjuana Kava, NP  traZODone (DESYREL) 100 MG tablet Take 1 tablet (100 mg total) by mouth at bedtime. For sleep 12/27/12   Sanjuana Kava, NP   BP 173/91  Pulse 138  Temp(Src) 97.7 F (36.5 C) (Oral)  Resp 18   SpO2 100% Physical Exam  Nursing note and vitals reviewed. Constitutional: He is oriented to person, place, and time. He appears well-developed and well-nourished. No distress.  HENT:  Head: Normocephalic and atraumatic.  Mouth/Throat: Oropharynx is clear and moist.  Eyes: Conjunctivae and EOM are normal. Pupils are equal, round, and reactive to light.  Neck: Normal range of motion. Neck supple.  Cardiovascular: Regular rhythm and intact distal pulses.   tachy  Pulmonary/Chest: Effort normal and breath sounds normal. No stridor. No respiratory distress. He has no wheezes. He has no rales. He exhibits no tenderness.  Abdominal: Soft. Bowel sounds are normal. He exhibits no distension and no mass. There is no tenderness. There is no rebound and no guarding.  Musculoskeletal: Normal range of motion. He exhibits tenderness. He exhibits no edema.       Legs: Right knee: No deformity, erythema or abrasions. FROM. No effusion; ++crepitance. Anterior and posterior drawer show no abnormal laxity. Stable to valgus and varus stress. Joint lines are non-tender. Neurovascularly intact. Pt ambulates with an antalgic gait.    Neurological: He is alert and oriented to person, place, and time.  Tongue fasciculations and hand tremors.  Psychiatric: He has a normal mood and affect.    ED Course  Procedures (including critical care time) Labs Review Labs Reviewed  CBC WITH DIFFERENTIAL - Abnormal; Notable for the following:    WBC 2.8 (*)    RBC 3.86 (*)    Hemoglobin 12.5 (*)    HCT 34.7 (*)    Neutro Abs 1.6 (*)    All other components within normal limits  COMPREHENSIVE METABOLIC PANEL - Abnormal; Notable for the following:    Potassium 3.4 (*)    Glucose, Bld 192 (*)    Calcium 7.8 (*)    Albumin 2.8 (*)    AST 285 (*)    ALT 120 (*)    Total Bilirubin 2.0 (*)    Anion gap 21 (*)    All other components within normal limits  LIPASE, BLOOD - Abnormal; Notable for the following:    Lipase  103 (*)    All other components within normal limits  ETHANOL - Abnormal; Notable for the following:    Alcohol, Ethyl (B) 31 (*)    All other components within normal limits  MAGNESIUM - Abnormal; Notable for the following:    Magnesium 1.3 (*)    All other components within normal limits  URINE RAPID DRUG SCREEN (HOSP PERFORMED) - Abnormal; Notable for the following:    Opiates POSITIVE (*)    Tetrahydrocannabinol POSITIVE (*)    All other components within normal limits  URINALYSIS, ROUTINE W REFLEX MICROSCOPIC - Abnormal; Notable for the following:    Color, Urine AMBER (*)    APPearance CLOUDY (*)    Glucose, UA 250 (*)  Hgb urine dipstick TRACE (*)    Bilirubin Urine SMALL (*)    Ketones, ur 15 (*)    Protein, ur 100 (*)    Urobilinogen, UA 2.0 (*)    All other components within normal limits  BLOOD GAS, VENOUS - Abnormal; Notable for the following:    pH, Ven 7.416 (*)    pCO2, Ven 34.6 (*)    All other components within normal limits  BLOOD GAS, ARTERIAL - Abnormal; Notable for the following:    pH, Arterial 7.479 (*)    pCO2 arterial 27.1 (*)    pO2, Arterial 58.1 (*)    Bicarbonate 19.9 (*)    Acid-base deficit 2.1 (*)    All other components within normal limits  PRO B NATRIURETIC PEPTIDE - Abnormal; Notable for the following:    Pro B Natriuretic peptide (BNP) 5707.0 (*)    All other components within normal limits  GLUCOSE, CAPILLARY - Abnormal; Notable for the following:    Glucose-Capillary 272 (*)    All other components within normal limits  MRSA PCR SCREENING  OCCULT BLOOD GASTRIC / DUODENUM (SPECIMEN CUP)  PROTIME-INR  APTT  TROPONIN I  URINE MICROSCOPIC-ADD ON  URIC ACID  TROPONIN I  COMPREHENSIVE METABOLIC PANEL  CBC  MAGNESIUM  HEMOGLOBIN A1C  HIV ANTIBODY (ROUTINE TESTING)  TROPONIN I    Imaging Review Dg Knee Complete 4 Views Right  02/20/2014   CLINICAL DATA:  Knee pain  EXAM: RIGHT KNEE - COMPLETE 4+ VIEW  COMPARISON:  12/28/2013   FINDINGS: No acute fracture or dislocation is identified. No gross soft tissue abnormality is seen. Diffuse vascular calcifications are noted.  IMPRESSION: No acute abnormality noted.   Electronically Signed   By: Alcide Clever M.D.   On: 02/20/2014 10:44     EKG Interpretation   Date/Time:  Thursday February 20 2014 13:47:17 EDT Ventricular Rate:  108 PR Interval:  149 QRS Duration: 87 QT Interval:  365 QTC Calculation: 489 R Axis:   86 Text Interpretation:  Sinus tachycardia Probable left atrial enlargement  Probable left ventricular hypertrophy Nonspecific T abnormalities, lateral  leads Borderline prolonged QT interval Overall similar to previous.  Confirmed by Jodi Mourning  MD, JOSHUA ([id number]) on 02/20/2014 2:23:02 PM      MDM   Final diagnoses:  Arthralgia of right knee  Alcohol withdrawal, with unspecified complication  High anion gap metabolic acidosis  Non compliance w medication regimen  Type 2 diabetes mellitus with complication  Essential hypertension    Filed Vitals:   02/20/14 1005 02/20/14 1144  BP: 158/91 173/91  Pulse: 106 138  Temp: 97.7 F (36.5 C)   TempSrc: Oral   Resp: 18 18  SpO2: 98% 100%    Medications  sodium chloride 0.9 % bolus 1,000 mL (not administered)  ondansetron (ZOFRAN) injection 4 mg (not administered)  LORazepam (ATIVAN) injection 1 mg (not administered)  sodium chloride 0.9 % 1,000 mL with thiamine 100 mg, folic acid 1 mg, multivitamins adult 10 mL infusion (not administered)  morphine 4 MG/ML injection 4 mg (4 mg Intramuscular Given 02/20/14 1132)    [name] is a 58 y.o. male presenting with atraumatic right knee pain. No swelling, full range of motion no warmth, I doubt septic joint. X-ray negative. Patient has crutches at home. I've advised him to use these. Patient will be given knee sleeve and injection of morphine. I will send him home with Vicodin. I advised him to follow closely with both his primary  care physician at the  [facility] and I will refer him to a local orthopedist.  This is a shared visit with the attending physician who personally evaluated the patient and agrees with the care plan.   Patient remained tachycardic through this visit. He has not had any alcohol in 24 hours. Patient also started vomiting. Patient reveals he has been noncompliant with any medication in over 2 months. I believe the patient is in alcohol withdrawal, he also may be in DKA from insulin noncompliance. Patient is moved to a higher acuity room or he can be monitored more closely. Patient is requesting detox from alcohol, psychiatric clearing orders placed.  Patient with normal pH via a VBG. Small amount of ketones in urine. Blood glucose is only mildly elevated under 200. I don't think this is DKA, I think this is more likely AKA. Patient will be aggressively hydrated, replace his magnesium and plan is admit to medicine  Patient is having runs of V. tach lasting approximately 8 seconds, on further evaluation of the rhythm strip the tracing appears very regular, I think this may be a wide complex A. fib. 12-lead is unchanged from prior and troponin is negative. Patient is remained asymptomatic with no chest pain, shortness of breath, chest pressure or nausea.  Patient will be admitted to [facility] Dr. Arbutus Leas.  Patient was still in the ED he became hypoxic and diaphoretic with increased work of breathing, denying chest pain. Patient was evaluated by attending physician Dr. Unknown Foley who has updated Tat as to the patient's condition, Dr. Arbutus Leas is in the room evaluating the patient.  Wynetta Emery, PA-C 02/20/14 2227

## 2014-02-20 NOTE — ED Notes (Addendum)
Pt reports he fell 3 months ago and injured right knee. Pain has continued. Reports pain keeps him up at night. Pain 10/10. Pt ambulatory

## 2014-02-20 NOTE — ED Notes (Signed)
Pt given meal tray.  IVF's.."banana bag" now infusing after receiving an IV pump.  Cardiac monitor noted to have several 6-8second sustained runs of Vtach as pt continued to sit up eating and not have any complaints.  ED PA made aware.  Will continue to monitor.

## 2014-02-20 NOTE — ED Notes (Signed)
Pt is still unable to provide urine specimen, urinal left at bedside and pt instructed to call out when sample is available.

## 2014-02-20 NOTE — H&P (Signed)
History and Physical  [name] ZOX:[id number] DOB: 05/03/1955 DOA: 02/20/2014   PCP: PROVIDER NOT IN SYSTEM   Chief Complaint: N/V, R-knee pain  HPI:  58 y/o male with hx of CAD, Etoh dependence, DM2, HTN, depression, HLD, presents with one-month history of worsening right knee pain. The patient states that he had a mechanical fall approximately 3 months ago injuring his right knee. Since that period of time he has had worsening knee pain, particularly in the past 1 month. He has not noted increasing erythema or edema and his right knee. In Emergency Department, right knee x-rays were negative for any acute fractures or dislocations. In addition, the patient began having intractable nausea and vomiting manifesting as dry heaves on the morning of admission. He denied any coffee grounds emesis or hematemesis. The patient states that he has been taking Motrin as well as Aleve on a daily basis for the past 2-3 weeks to relieve his knee pain. In addition, the patient has been drinking alcohol chronically. On routine day, the patient drinks 440 ounce beers per day, and he states that he occasionally mixes some liquor with it.  He states that he has been also using cannabis to help relieve his knee pain. He denies any other illegal drug use. Lab work in emergency department revealed a metabolic acidosis with anion gap of 21. Potassium was 3.4. Magnesium was 1.3. The CBC was 2.8, hemoglobin 10.5, platelets 150,000. Urinalysis was negative for any pyuria but showed ketonuria and proteinuria.  Gastro-occult was negative. As a result of the patient's metabolic acidosis and persistent vomiting, admission was requested. In addition, the patient states that he receives most of his medical care at the [facility] in [place].  Unfortunately, the patient has not taken any of his medications including his insulin, antihypertensives, or antidepressants in the past 2 months.  Assessment/Plan: Alcoholic  ketoacidosis -Alcohol level-31 -IV fluids -Alcohol withdrawal protocol -Last drink was in the a.m. of 02/19/2014  Intractable nausea and vomiting  -Likely due to the patient's alcoholic gastritis in addition to NSAID use  -Gastro-occult was negative  -Continue antiemetics, IV fluids  -Mildly elevated lipases of uncertain significance given the patient's chronic alcohol use  -Start PPI Transaminasemia/Mildly elevated Lipase -Abdominal US -trend Tachycardia  -EKG in the emergency department revealed sinus tachycardia  -However, during my examination patient had brief episodes of tachycardia up to 140s with telemetry appearing to have multifocal atrial tachycardia  -Plan to start calcium channel blocker if the patient continues to have sustained/recurrent elevated heart rate poorly responsive to IV fluids Tobacco use -Patient likely has underlying COPD -Approximately 20-pack-year history Diabetes mellitus type 2 -Noncompliant with medications -Start half home dose of Lantus insulin -NovoLog sliding scale -Hemoglobin A1c Depression -Restart home anti-depressants including Zoloft, bupropion, and trazodone Coronary artery disease with hx MI -continue ASA -presently no chest pain Hypoxemia/Acute Respiratory Failure -start with CXR -supplemental oxygen -presently stable on 4L without distress       Past Medical History  Diagnosis Date  . Diabetes mellitus   . Hypertension   . Coronary artery disease   . MI (myocardial infarction) 62    age 67 per patient  . Mental disorder   . Depression    Past Surgical History  Procedure Laterality Date  . Angioplasty  1984    at age 79   Social History:  reports that he has been smoking Cigarettes.  He has been smoking about 0.50 packs per day. He has  never used smokeless tobacco. He reports that he drinks about 1.2 ounces of alcohol per week. He reports that he uses illicit drugs (Cocaine and Marijuana) about 7 times per  week.   History reviewed. No pertinent family history.   No Active Allergies    Prior to Admission medications   Medication Sig Start Date End Date Taking? Authorizing Provider  aspirin 81 MG tablet Take 81 mg by mouth daily.    Historical Provider, MD  buPROPion (WELLBUTRIN XL) 150 MG 24 hr tablet Take 150 mg by mouth daily.    Historical Provider, MD  furosemide (LASIX) 20 MG tablet Take 10 mg by mouth daily.    Historical Provider, MD  HYDROcodone-acetaminophen (NORCO/VICODIN) 5-325 MG per tablet Take 1-2 tablets by mouth every 6 hours as needed for pain. 02/20/14   Nicole Pisciotta, PA-C  insulin glargine (LANTUS) 100 UNIT/ML injection Inject 0.3 mLs (30 Units total) into the skin at bedtime. For high blood sugar control 12/27/12   Sanjuana Kava, NP  lisinopril (PRINIVIL,ZESTRIL) 20 MG tablet Take 20 mg by mouth daily.    Historical Provider, MD  metFORMIN (GLUCOPHAGE) 1000 MG tablet Take 0.5 tablets (500 mg total) by mouth 2 (two) times daily with a meal. For high blood sugar control 12/27/12   Sanjuana Kava, NP  naproxen (NAPROSYN) 500 MG tablet Take 1 tablet (500 mg total) by mouth 2 (two) times daily. 12/28/13   Fayrene Helper, PA-C  potassium chloride (K-DUR) 10 MEQ tablet Take 1 tablet (10 mEq total) by mouth daily. For low potassium supplement 12/27/12   Sanjuana Kava, NP  pravastatin (PRAVACHOL) 10 MG tablet Take 1 tablet (10 mg total) by mouth daily. For high cholesterol control 12/27/12   Sanjuana Kava, NP  sertraline (ZOLOFT) 100 MG tablet Take 1 tablet (100 mg total) by mouth at bedtime. For depression 12/27/12   Sanjuana Kava, NP  traZODone (DESYREL) 100 MG tablet Take 1 tablet (100 mg total) by mouth at bedtime. For sleep 12/27/12   Sanjuana Kava, NP    Review of Systems:  Constitutional:  No weight loss, night sweats, Fevers, chills, fatigue.  Head&Eyes: No headache.  No vision loss.  No eye pain or scotoma ENT:  No Difficulty swallowing,Tooth/dental problems,Sore throat,    Cardio-vascular:  No chest pain, Orthopnea, PND, swelling in lower extremities,  dizziness, palpitations  GI:  No  abdominal pain, nausea, vomiting, diarrhea, loss of appetite, hematochezia, melena, heartburn, indigestion, Resp:  No  No cough. No coughing up of blood .No wheezing.No chest wall deformity  Skin:  no rash or lesions.  GU:  no dysuria, change in color of urine, no urgency or frequency. No flank pain.  Musculoskeletal:  No joint pain or swelling. No decreased range of motion. No back pain.  Psych:  No change in mood or affect.  Neurologic: No headache, no dysesthesia, no focal weakness, no vision loss. No syncope  Physical Exam: Filed Vitals:   02/20/14 1409 02/20/14 1412 02/20/14 1450 02/20/14 1529  BP: 166/102  157/100 169/97  Pulse:  115 114 117  Temp:      TempSrc:      Resp:  16 22 23   SpO2:  90% 93% 94%   General:  A&O x 3, NAD, nontoxic, pleasant/cooperative Head/Eye: No conjunctival hemorrhage, no icterus, [place]/AT, No nystagmus ENT:  No icterus,  No thrush, good dentition, no pharyngeal exudate Neck:  No masses, no lymphadenpathy, no bruits CV:  RRR, no rub, no gallop,  no S3 Lung:  Right basilar crackles. Diminished breath sounds right base. No wheezing.  Abdomen: soft/NT, +BS, nondistended, no peritoneal signs Ext: No cyanosis, No rashes, No petechiae, No lymphangitis, No edema--right knee without any erythema, effusion, warmth, open wounds, crepitance; mild to moderate pain with flexion and extension of the right knee  Neuro: CNII-XII intact, strength 4/5 in bilateral upper and lower extremities, no dysmetria  Labs on Admission:  Basic Metabolic Panel:  Recent Labs Lab 02/20/14 1228  NA 140  K 3.4*  CL 98  CO2 21  GLUCOSE 192*  BUN 7  CREATININE 0.60  CALCIUM 7.8*  MG 1.3*   Liver Function Tests:  Recent Labs Lab 02/20/14 1228  AST 285*  ALT 120*  ALKPHOS 98  BILITOT 2.0*  PROT 7.3  ALBUMIN 2.8*    Recent Labs Lab  02/20/14 1228  LIPASE 103*   No results found for this basename: AMMONIA,  in the last 168 hours CBC:  Recent Labs Lab 02/20/14 1228  WBC 2.8*  NEUTROABS 1.6*  HGB 12.5*  HCT 34.7*  MCV 89.9  PLT 158   Cardiac Enzymes:  Recent Labs Lab 02/20/14 1408  TROPONINI <0.30   BNP: No components found with this basename: POCBNP,  CBG: No results found for this basename: GLUCAP,  in the last 168 hours  Radiological Exams on Admission: Dg Knee Complete 4 Views Right  02/20/2014   CLINICAL DATA:  Knee pain  EXAM: RIGHT KNEE - COMPLETE 4+ VIEW  COMPARISON:  12/28/2013  FINDINGS: No acute fracture or dislocation is identified. No gross soft tissue abnormality is seen. Diffuse vascular calcifications are noted.  IMPRESSION: No acute abnormality noted.   Electronically Signed   By: Alcide CleverMark  Lukens M.D.   On: 02/20/2014 10:44    EKG: Independently reviewed. Sinus tachycardia, no ST-T wave change     Time spent:70 minutes Code Status:   FULL Family Communication:   No Family at bedside   Quavion Boule, DO  [facility] Pager [phone]  If 7PM-7AM, please contact night-coverage [email or web address] Password [email or web address] 02/20/2014, 4:44 PM

## 2014-02-20 NOTE — Progress Notes (Signed)
Addendum to history and physical-- Prior to the patient's transfer to the floor, the patient tried to get up to go to the bathroom and became extremely diaphoretic with worsening shortness of breath.  Patient's oxygenation worsened requiring 6 L nasal cannula with oxygenation 91-93 percent. Patient denied any chest discomfort but complained of some shortness of breath and some lightheadedness. Denied any coughing or hemoptysis or abdominal pain or vomiting.  --I have ordered a stat chest x-ray, stat ABG --Admission is changed to stepdown unit Chest x-ray revealed vague right lower lobe opacity when compared to previous chest x-rays as well as interstitial prominence--> concerned about patient having pneumonia, possibly aspiration given the patient's recurrent vomiting as well as possible CHF -I have ordered a dose of Zosyn while the patient remains in the emergency department --ABG  7.47/27/58/19 (6L) --placed pt on venturi mask 50%-->oxygen sat 95% --cycle troponins --Echo --previous echo 08/11/12 showed EF 40-45% with grade 2 diastolic dysfunction --ordered BDs -proBNP -saline lock IVF and give furosemide IV x1--ordered in ED -repeat CXR in am -Total time spent 45 additional minutes  DTat

## 2014-02-20 NOTE — Progress Notes (Signed)
Utilization Review completed.  Saiya Crist RN CM  

## 2014-02-21 ENCOUNTER — Inpatient Hospital Stay (HOSPITAL_COMMUNITY): Payer: Medicare Other

## 2014-02-21 DIAGNOSIS — I059 Rheumatic mitral valve disease, unspecified: Secondary | ICD-10-CM

## 2014-02-21 DIAGNOSIS — J690 Pneumonitis due to inhalation of food and vomit: Secondary | ICD-10-CM | POA: Diagnosis not present

## 2014-02-21 DIAGNOSIS — E872 Acidosis: Secondary | ICD-10-CM

## 2014-02-21 DIAGNOSIS — F10129 Alcohol abuse with intoxication, unspecified: Secondary | ICD-10-CM

## 2014-02-21 DIAGNOSIS — J9601 Acute respiratory failure with hypoxia: Secondary | ICD-10-CM

## 2014-02-21 LAB — COMPREHENSIVE METABOLIC PANEL
ALT: 97 U/L — AB (ref 0–53)
AST: 198 U/L — ABNORMAL HIGH (ref 0–37)
Albumin: 2.4 g/dL — ABNORMAL LOW (ref 3.5–5.2)
Alkaline Phosphatase: 87 U/L (ref 39–117)
Anion gap: 15 (ref 5–15)
BUN: 5 mg/dL — AB (ref 6–23)
CALCIUM: 7.2 mg/dL — AB (ref 8.4–10.5)
CO2: 23 mEq/L (ref 19–32)
CREATININE: 0.58 mg/dL (ref 0.50–1.35)
Chloride: 96 mEq/L (ref 96–112)
GFR calc Af Amer: >90 mL/min (ref >90)
GLUCOSE: 243 mg/dL — AB (ref 70–99)
Potassium: 3 mEq/L — ABNORMAL LOW (ref 3.7–5.3)
Sodium: 134 mEq/L — ABNORMAL LOW (ref 137–147)
TOTAL PROTEIN: 6.4 g/dL (ref 6.0–8.3)
Total Bilirubin: 2.2 mg/dL — ABNORMAL HIGH (ref 0.3–1.2)

## 2014-02-21 LAB — GLUCOSE, CAPILLARY
GLUCOSE-CAPILLARY: 165 mg/dL — AB (ref 70–99)
Glucose-Capillary: 157 mg/dL — ABNORMAL HIGH (ref 70–99)
Glucose-Capillary: 125 mg/dL — ABNORMAL HIGH (ref 70–99)
Glucose-Capillary: 172 mg/dL — ABNORMAL HIGH (ref 70–99)

## 2014-02-21 LAB — CBC
HEMATOCRIT: 33.8 % — AB (ref 39.0–52.0)
Hemoglobin: 11.4 g/dL — ABNORMAL LOW (ref 13.0–17.0)
MCH: 30.6 pg (ref 26.0–34.0)
MCHC: 33.7 g/dL (ref 30.0–36.0)
MCV: 90.6 fL (ref 78.0–100.0)
PLATELETS: 139 10*3/uL — AB (ref 150–400)
RBC: 3.73 MIL/uL — AB (ref 4.22–5.81)
RDW: 13.5 % (ref 11.5–15.5)
WBC: 3.2 10*3/uL — AB (ref 4.0–10.5)

## 2014-02-21 LAB — TROPONIN I

## 2014-02-21 LAB — MAGNESIUM: Magnesium: 1.4 mg/dL — ABNORMAL LOW (ref 1.5–2.5)

## 2014-02-21 LAB — HIV ANTIBODY (ROUTINE TESTING W REFLEX): HIV 1&2 Ab, 4th Generation: NONREACTIVE

## 2014-02-21 MED ORDER — FLUMAZENIL 0.5 MG/5ML IV SOLN
INTRAVENOUS | Status: AC
  Filled 2014-02-21: qty 5

## 2014-02-21 MED ORDER — PANTOPRAZOLE SODIUM 40 MG IV SOLR
40.0000 mg | Freq: Two times a day (BID) | INTRAVENOUS | Status: DC
  Administered 2014-02-21 – 2014-02-25 (×9): 40 mg via INTRAVENOUS
  Filled 2014-02-21 (×10): qty 40

## 2014-02-21 MED ORDER — CHLORHEXIDINE GLUCONATE 0.12 % MT SOLN
15.0000 mL | Freq: Two times a day (BID) | OROMUCOSAL | Status: DC
  Administered 2014-02-21 – 2014-02-27 (×10): 15 mL via OROMUCOSAL
  Filled 2014-02-21 (×14): qty 15

## 2014-02-21 MED ORDER — POTASSIUM CHLORIDE 10 MEQ/100ML IV SOLN
10.0000 meq | INTRAVENOUS | Status: AC
  Administered 2014-02-21 (×3): 10 meq via INTRAVENOUS
  Filled 2014-02-21 (×3): qty 100

## 2014-02-21 MED ORDER — MAGNESIUM SULFATE 40 MG/ML IJ SOLN
2.0000 g | Freq: Once | INTRAMUSCULAR | Status: AC
  Administered 2014-02-21: 2 g via INTRAVENOUS
  Filled 2014-02-21: qty 50

## 2014-02-21 MED ORDER — POTASSIUM CHLORIDE CRYS ER 20 MEQ PO TBCR
40.0000 meq | EXTENDED_RELEASE_TABLET | Freq: Once | ORAL | Status: AC
  Administered 2014-02-21: 40 meq via ORAL
  Filled 2014-02-21: qty 2

## 2014-02-21 MED ORDER — CETYLPYRIDINIUM CHLORIDE 0.05 % MT LIQD
7.0000 mL | Freq: Two times a day (BID) | OROMUCOSAL | Status: DC
  Administered 2014-02-21 – 2014-02-27 (×9): 7 mL via OROMUCOSAL

## 2014-02-21 NOTE — Progress Notes (Signed)
Echocardiogram 2D Echocardiogram has been performed.  [name] 02/21/2014, 2:43 PM

## 2014-02-21 NOTE — Progress Notes (Addendum)
Patient ID: [name], male   DOB: 1955/11/05, 58 y.o.   MRN: [id number] [facility] PROGRESS NOTE  [name] OEH:[id number] DOB: 06-15-1955 DOA: 02/20/2014 PCP: PROVIDER NOT IN SYSTEM  Brief narrative: 58 y/o male with hx of CAD, EtOH dependence, DM2, HTN, depression, HLD who presented to [facility] ED 02/20/2014 with 1 month history of worsening right knee pain which occurred status post fall abbot 3 months prior to this admsision. Pt reported increased swelling and redness over the knee area. Pt reported being on aleve or motrin for chronic knee pain and states daily NSAID's use. He also reported drinking alcohol chronically. Additionally, patient started having intractable nausea and vomiting started on the morning of this admission. In ED, vitals were stable. X ray of the knee did not reveal acute fractures.  Blood work revealed WBC count of 2.8, hemoglobin of 12.5, potassium 3.4, calcium 7.8, magnesium 1.3. UA showed ketonuria and proteinuria. FOBT was negative. Alcohol level was 31, UDS positive for opiates and THC. Lipase level was 103 in addition to elevated AST 285, ALT 120, normal ALP and bilirubin 2.0. Anion gap 21.  Of note, more respiratory distress and O2 sats in 80's noted once pt on the unit. CXR ordered and noted possible pneumonia. Pt transferred to SDU for further monitoring.   Assessment/Plan:    Principal Problem: Acute alcohol intoxication / Alcohol ketoacidosis  Patient had alcohol level on admission 31. CIWA protocol initiated.   Woud minimize IV fluids due to possibility of acute decompensated CHF.  Repleted magnesium this morning since level was 1.4. Repeat blood work in the morning.   Monitor for withdrawals   Active Problems: Community acquired pneumonia / probable aspiration pneumonia  CPA or possible aspiration pneumnia considering alcohol ingestion and high risk of aspiration.  Pt started on zosyn.  Blood cultures not obtained at the time of  admission.  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 BNP on this admission 5700.  Pt received bolus 40 mg IV lasix on admission.  Minimize IV fluids if possible (which may present medical conundrum since pt has alcohol ketoacidosis and likely acute pancreatitis)  Daily weight. Strict intake and output.  Replete electrolytes, potassium and magnesium.   Continue lisinopril.  Follow up 2 D ECHO on this admission.  Acute pancreatitis  Likely related to alcohol abuse  Lipase level 103 on admission. Diet now clear liquid. Intractable nausea and vomiting   Likely due to acute alcohol related pancreatitis versus withdrawals.  Provided supportive care with IV fluids, analgesia and antiemetics.   Tolerated clear liquid diet so will minimize IVF due to CHF.  Change protonix to IV regimen Q 12 hours. Abnormal LFT's  Likely alcohol related hepatitis  Continue to monitor the LFT's trend.  Essential hypertension  Continue lisinopril Tachycardia   EKG in the emergency department revealed sinus tachycardia  Tobacco use   Patient likely has underlying COPD   Approximately 20-pack-year history  Diabetes mellitus type 2, controlled, no complications   A1c 7.3 on this admission indicating good glycemic control.   Now on SSI. His insulin has been resumed, lantus 15 units at bedtime  Dyslipidemia  Resumed statin therapy  Depression   Resumed home anti-depressants including Zoloft, bupropion, and trazodone  Coronary artery disease with hx MI   Continue ASA    DVT Prophylaxis   enoxaparin  Code Status: Full.  Family  Communication:  plan of care discussed with the patient Disposition Plan: Home when stable.    IV Access:   Peripheral IV Procedures and diagnostic studies:   Dg Chest Port 1 View 02/20/2014   1. New bilateral lower lobe airspace disease, worse on  the right, is concerning for pneumonia, particularly on the right. 2. New diffuse interstitial pattern compatible with edema.   Electronically Signed   By: Gennette Pac M.D.   On: 02/20/2014 17:36   Dg Knee Complete 4 Views Right 02/20/2014  No acute abnormality noted.   Electronically Signed   By: Alcide Clever M.D.   On: 02/20/2014 10:44   Medical Consultants:   None  Other Consultants:   None  Anti-Infectives:   Zosyn 02/20/2014 -->    Manson Passey, MD  [facility] Pager [phone]  If 7PM-7AM, please contact night-coverage [email or web address] Password [email or web address] 02/21/2014, 6:51 AM   LOS: 1 day    HPI/Subjective: No acute overnight events.  Objective: Filed Vitals:   02/21/14 0100 02/21/14 0200 02/21/14 0300 02/21/14 0350  BP: 129/93 128/70 125/78   Pulse: 102 92 92   Temp:    98.2 F (36.8 C)  TempSrc:    Oral  Resp: 14 17 19    Height:      Weight:      SpO2: 98% 98% 96%     Intake/Output Summary (Last 24 hours) at 02/21/14 0651 Last data filed at 02/21/14 0320  Gross per 24 hour  Intake   1300 ml  Output   2025 ml  Net   -725 ml    Exam:   General:  Pt is alert, follows commands appropriately, not in acute distress  Cardiovascular: Regular rate and rhythm, S1/S2, no murmurs  Respiratory: Clear to auscultation bilaterally, no wheezing, no crackles, no rhonchi  Abdomen: Soft, non tender, non distended, bowel sounds present  Extremities: No edema, pulses DP and PT palpable bilaterally  Neuro: Grossly nonfocal  Data Reviewed: Basic Metabolic Panel:  Recent Labs Lab 02/20/14 1228 02/21/14 0150  NA 140 134*  K 3.4* 3.0*  CL 98 96  CO2 21 23  GLUCOSE 192* 243*  BUN 7 5*  CREATININE 0.60 0.58  CALCIUM 7.8* 7.2*  MG 1.3* 1.4*   Liver Function Tests:  Recent Labs Lab 02/20/14 1228 02/21/14 0150  AST 285* 198*  ALT 120* 97*  ALKPHOS 98 87  BILITOT 2.0* 2.2*  PROT 7.3 6.4  ALBUMIN 2.8* 2.4*    Recent Labs Lab 02/20/14 1228  LIPASE  103*   No results found for this basename: AMMONIA,  in the last 168 hours CBC:  Recent Labs Lab 02/20/14 1228 02/21/14 0150  WBC 2.8* 3.2*  NEUTROABS 1.6*  --   HGB 12.5* 11.4*  HCT 34.7* 33.8*  MCV 89.9 90.6  PLT 158 139*   Cardiac Enzymes:  Recent Labs Lab 02/20/14 1408 02/20/14 1941 02/21/14 0150  TROPONINI <0.30 <0.30 <0.30   BNP: No components found with this basename: POCBNP,  CBG:  Recent Labs Lab 02/20/14 2156  GLUCAP 272*    Recent Results (from the past 240 hour(s))  MRSA PCR SCREENING     Status: None   Collection Time    02/20/14  6:48 PM      Result Value Ref Range Status   MRSA by PCR NEGATIVE  NEGATIVE Final   Comment:            The GeneXpert MRSA Assay (FDA     approved for NASAL  specimens     only), is one component of a     comprehensive MRSA colonization     surveillance program. It is not     intended to diagnose MRSA     infection nor to guide or     monitor treatment for     MRSA infections.     Scheduled Meds: . aspirin EC  81 mg Oral Daily  . buPROPion  150 mg Oral Daily  . enoxaparin (LOVENOX) injection  40 mg Subcutaneous Q24H  . folic acid  1 mg Oral Daily  . Influenza vac split quadrivalent PF  0.5 mL Intramuscular Tomorrow-1000  . insulin aspart  0-9 Units Subcutaneous TID WC  . insulin glargine  15 Units Subcutaneous QHS  . ipratropium-albuterol  3 mL Nebulization TID  . lisinopril  20 mg Oral Daily  . LORazepam  0-4 mg Intravenous 4 times per day   Followed by  . [START ON 02/22/2014] LORazepam  0-4 mg Intravenous Q12H  . multivitamin with minerals  1 tablet Oral Daily  . pantoprazole  40 mg Oral BID  . piperacillin-tazobactam (ZOSYN)  IV  3.375 g Intravenous Q8H  . pravastatin  10 mg Oral Daily  . sertraline  100 mg Oral QHS  . sodium chloride  3 mL Intravenous Q12H  . thiamine  100 mg Oral Daily   Or  . thiamine  100 mg Intravenous Daily  . traZODone  100 mg Oral QHS   Continuous Infusions:

## 2014-02-22 DIAGNOSIS — E46 Unspecified protein-calorie malnutrition: Secondary | ICD-10-CM | POA: Diagnosis present

## 2014-02-22 DIAGNOSIS — E876 Hypokalemia: Secondary | ICD-10-CM

## 2014-02-22 DIAGNOSIS — D61818 Other pancytopenia: Secondary | ICD-10-CM | POA: Diagnosis present

## 2014-02-22 DIAGNOSIS — R7401 Elevation of levels of liver transaminase levels: Secondary | ICD-10-CM | POA: Diagnosis present

## 2014-02-22 DIAGNOSIS — E875 Hyperkalemia: Secondary | ICD-10-CM | POA: Diagnosis present

## 2014-02-22 DIAGNOSIS — I429 Cardiomyopathy, unspecified: Secondary | ICD-10-CM

## 2014-02-22 DIAGNOSIS — R740 Nonspecific elevation of levels of transaminase and lactic acid dehydrogenase [LDH]: Secondary | ICD-10-CM

## 2014-02-22 DIAGNOSIS — F10239 Alcohol dependence with withdrawal, unspecified: Secondary | ICD-10-CM

## 2014-02-22 LAB — GLUCOSE, CAPILLARY
GLUCOSE-CAPILLARY: 121 mg/dL — AB (ref 70–99)
GLUCOSE-CAPILLARY: 207 mg/dL — AB (ref 70–99)
Glucose-Capillary: 134 mg/dL — ABNORMAL HIGH (ref 70–99)
Glucose-Capillary: 165 mg/dL — ABNORMAL HIGH (ref 70–99)

## 2014-02-22 LAB — BASIC METABOLIC PANEL
Anion gap: 15 (ref 5–15)
BUN: 6 mg/dL (ref 6–23)
CHLORIDE: 94 meq/L — AB (ref 96–112)
CO2: 23 meq/L (ref 19–32)
Calcium: 8 mg/dL — ABNORMAL LOW (ref 8.4–10.5)
Creatinine, Ser: 0.67 mg/dL (ref 0.50–1.35)
GFR calc Af Amer: >90 mL/min (ref >90)
GFR calc non Af Amer: >90 mL/min (ref >90)
GLUCOSE: 135 mg/dL — AB (ref 70–99)
Potassium: 3.4 mEq/L — ABNORMAL LOW (ref 3.7–5.3)
SODIUM: 132 meq/L — AB (ref 137–147)

## 2014-02-22 LAB — CBC
HEMATOCRIT: 36.3 % — AB (ref 39.0–52.0)
Hemoglobin: 12.6 g/dL — ABNORMAL LOW (ref 13.0–17.0)
MCH: 31.9 pg (ref 26.0–34.0)
MCHC: 34.7 g/dL (ref 30.0–36.0)
MCV: 91.9 fL (ref 78.0–100.0)
Platelets: 146 10*3/uL — ABNORMAL LOW (ref 150–400)
RBC: 3.95 MIL/uL — ABNORMAL LOW (ref 4.22–5.81)
RDW: 13.1 % (ref 11.5–15.5)
WBC: 3.7 10*3/uL — AB (ref 4.0–10.5)

## 2014-02-22 LAB — HEPATIC FUNCTION PANEL
ALBUMIN: 2.3 g/dL — AB (ref 3.5–5.2)
ALK PHOS: 89 U/L (ref 39–117)
ALT: 82 U/L — ABNORMAL HIGH (ref 0–53)
AST: 138 U/L — ABNORMAL HIGH (ref 0–37)
BILIRUBIN INDIRECT: 1 mg/dL — AB (ref 0.3–0.9)
Bilirubin, Direct: 1.8 mg/dL — ABNORMAL HIGH (ref 0.0–0.3)
Total Bilirubin: 2.8 mg/dL — ABNORMAL HIGH (ref 0.3–1.2)
Total Protein: 6.7 g/dL (ref 6.0–8.3)

## 2014-02-22 LAB — MAGNESIUM: MAGNESIUM: 1.6 mg/dL (ref 1.5–2.5)

## 2014-02-22 MED ORDER — DIAZEPAM 5 MG PO TABS
5.0000 mg | ORAL_TABLET | Freq: Three times a day (TID) | ORAL | Status: DC
  Administered 2014-02-22 – 2014-02-27 (×14): 5 mg via ORAL
  Filled 2014-02-22 (×14): qty 1

## 2014-02-22 MED ORDER — POTASSIUM CHLORIDE CRYS ER 20 MEQ PO TBCR
40.0000 meq | EXTENDED_RELEASE_TABLET | Freq: Once | ORAL | Status: AC
  Administered 2014-02-22: 40 meq via ORAL
  Filled 2014-02-22: qty 2

## 2014-02-22 MED ORDER — POTASSIUM CHLORIDE CRYS ER 20 MEQ PO TBCR
40.0000 meq | EXTENDED_RELEASE_TABLET | Freq: Every day | ORAL | Status: AC
  Administered 2014-02-23 – 2014-02-24 (×2): 40 meq via ORAL
  Filled 2014-02-22 (×2): qty 2

## 2014-02-22 MED ORDER — VITAMINS A & D EX OINT
TOPICAL_OINTMENT | CUTANEOUS | Status: AC
  Administered 2014-02-22: 19:00:00
  Filled 2014-02-22: qty 5

## 2014-02-22 MED ORDER — MAGNESIUM SULFATE 40 MG/ML IJ SOLN
2.0000 g | Freq: Once | INTRAMUSCULAR | Status: AC
  Administered 2014-02-22: 2 g via INTRAVENOUS
  Filled 2014-02-22: qty 50

## 2014-02-22 NOTE — Progress Notes (Signed)
[facility] PROGRESS NOTE  [name] ZOX:[id number] DOB: 1955-07-11 DOA: 02/20/2014 PCP: [facility]   Brief narrative 58 year old male with history of coronary artery disease, history of MI, cardiomyopathy, alcohol abuse, type 2 diabetes mellitus, hypertension, depression and hyperlipidemia presented with worsening right knee pain following a fall for past 3 months. Patient reported taking NSAIDs on a daily basis for swelling and redness over the area. Patient also drinks heavily (about 240 ounces daily). He also had intractable nausea and vomiting on the morning of admission. On admission patient had mild neutropenia with WBC of 2.8, hemoglobin of 12.5 and potassium of 3.4 with magnesium of 1.3. UA was positive for ketonuria and proteinuria with an alcohol level of 31. Urine drug screen was positive for opiates and THC. Lipase was mildly elevated to 103 with elevated AST of 285 and AST of 120, bilirubin of 2 and normal alkaline phosphatase . He had an anion gap of 21. Patient was admitted to the floor for alcoholic ketoacidosis along with alcoholic gastritis versus mild pancreatitis. However prior to transfer to the floor patient became extremely dyspneic and was placed on a 50% Venturi mask with chest x-ray showing vague right lower lobe opacity concerning for pneumonia. Patient transferred to stepdown unit for close monitoring. Patient now having signs of alcohol withdrawal.  Assessment/Plan: Principal problem Alcohol intoxication with withdrawal/alcoholic ketoacidosis Monitoring stepdown and continue CIWA. Patient is confused with early active withdrawal, having mild tachycardia, confusion and tremulous. High risk of going into DTs. I would place him on scheduled Valium 5 mg 3 times a day. -Continue thiamine, folate and multivitamin. IV fluids stopped due to acute decompensated CHF. Repeat low potassium and magnesium. Check phosphorous level. -Social work consult.  Needs counseling on alcohol cessation once more sober   Active problems Community  acquired pneumonia/? Aspiration pneumonia Patient at high risk for aspiration. Continue empiric Zosyn. Continue with DuoNeb's Clear liquids  Acute on chronic systolic CHF Ostium in the setting of alcoholic cardiomyopathy. Will BNP of 5700 on admission. holding fluids. Does not appear volume overloaded at this time. Monitor with daily weights and strict I/O 2-D echo showing EF of 25% with diffuse hypokinesis. -Continue aspirin. Will add lisinopril and low-dose metoprolol. Will need outpt cardiology follow up prior to discharge.   Acute alcoholic gastritis/ etoh hepatitis/ mild pancreatitis Continue with clear liquids. Continue twice a day PPI. Supportive care with antiemetics and pain medications. Ultrasound of the liver showing fatty changes. Monitor LFTs. Check hepatitis panel  Type 2 diabetes mellitus Continue bedtime Lantus and writing scale insulin. hb A1c of 7.3. Change SSI to moderate.    Essential hypertension Continue lisinopril. Will add low-dose metoprolol  Depression Continue home dose trazodone , Zoloft and bupropion  CAD with hx of  MI Continue aspirin. Hold statin given transaminitis. We'll add low-dose metoprolol.  Protein calorie malnutrition Nutrition consult.  Diet: Clear liquids  DVT prophylaxis: Lovenox  Code Status: Full code Family Communication: Sister  and niece at bedside. Patient lives [place]mate [place] Disposition Plan: Continue monitoring stepdown unit.   Consultants:  None  Procedures:  None  Antibiotics:  IV Zosyn  HPI/Subjective: Patient seen and examined. He is confused and tremulous.  Objective: Filed Vitals:   02/22/14 0800  BP: 125/65  Pulse: 111  Temp: 98 F (36.7 C)  Resp: 25    Intake/Output Summary (Last 24 hours) at 02/22/14 1156 Last data filed at 02/22/14 0959  Gross per 24 hour  Intake    618 ml   Output    125 ml  Net    493 ml   Filed Weights   02/20/14 1825  Weight: 78.5 kg (173 lb 1 oz)    Exam:   General:  Elderly male lying in bed appears confused and tremulous  HEENT: No pallor, moist oral mucosa  Cardiovascular: S1 and S2 tachycardic, no murmurs rubs or gallop  Respiratory: Clear to auscultation bilaterally, no added sounds  Abdomen: , Nondistended, nontender, bowel sounds present  Musculoskeletal: On, no edema  CNS: Awake and oriented to self, recognizes his sister and his niece. Has slurred speech and tremulous  Data Reviewed: Basic Metabolic Panel:  Recent Labs Lab 02/20/14 1228 02/21/14 0150 02/22/14 0340  NA 140 134* 132*  K 3.4* 3.0* 3.4*  CL 98 96 94*  CO2 21 23 23   GLUCOSE 192* 243* 135*  BUN 7 5* 6  CREATININE 0.60 0.58 0.67  CALCIUM 7.8* 7.2* 8.0*  MG 1.3* 1.4* 1.6   Liver Function Tests:  Recent Labs Lab 02/20/14 1228 02/21/14 0150  AST 285* 198*  ALT 120* 97*  ALKPHOS 98 87  BILITOT 2.0* 2.2*  PROT 7.3 6.4  ALBUMIN 2.8* 2.4*    Recent Labs Lab 02/20/14 1228  LIPASE 103*   No results found for this basename: AMMONIA,  in the last 168 hours CBC:  Recent Labs Lab 02/20/14 1228 02/21/14 0150 02/22/14 0340  WBC 2.8* 3.2* 3.7*  NEUTROABS 1.6*  --   --   HGB 12.5* 11.4* 12.6*  HCT 34.7* 33.8* 36.3*  MCV 89.9 90.6 91.9  PLT 158 139* 146*   Cardiac Enzymes:  Recent Labs Lab 02/20/14 1408 02/20/14 1941 02/21/14 0150  TROPONINI <0.30 <0.30 <0.30   BNP (last 3 results)  Recent Labs  02/20/14 1939  PROBNP 5707.0*   CBG:  Recent Labs Lab 02/21/14 0821 02/21/14 1206 02/21/14 1631 02/21/14 2134 02/22/14 0747  GLUCAP 165* 157* 125* 172* 121*    Recent Results (from the past 240 hour(s))  MRSA PCR SCREENING     Status: None   Collection Time    02/20/14  6:48 PM      Result Value Ref Range Status   MRSA by PCR NEGATIVE  NEGATIVE Final   Comment:            The GeneXpert MRSA Assay (FDA      approved for NASAL specimens     only), is one component of a     comprehensive MRSA colonization     surveillance program. It is not     intended to diagnose MRSA     infection nor to guide or     monitor treatment for     MRSA infections.     Studies: US Abdomen Complete  02/21/2014   CLINICAL DATA:  Elevated liver function tests and lipase  EXAM: ULTRASOUND ABDOMEN COMPLETE  COMPARISON:  CT 10/23/2008  FINDINGS: Gallbladder: No gallstones or wall thickening visualized. No sonographic Murphy sign noted.  Common bile duct: Diameter: 3 mm  Liver: Hepatic increased echogenicity likely indicates underlying steatosis without focal abnormality or intrahepatic ductal dilatation.  IVC: No abnormality visualized.  Pancreas: Visualized portion unremarkable.  Spleen: Size and appearance within normal limits.  Right Kidney: Length: 13 cm. Echogenicity within normal limits. No mass or hydronephrosis visualized.  Left Kidney: Length: 12.7 cm. Echogenicity within normal limits. No mass or hydronephrosis visualized.  Abdominal aorta: No aneurysm visualized.  Other findings: None.  IMPRESSION: Hepatic steatosis without acute intra-abdominal abnormality.   Electronically Signed   By: Christiana PellantGretchen  Green M.D.   On: 02/21/2014 08:21   Dg Chest Port 1 View  02/20/2014   CLINICAL DATA:  Progressive shortness of breath and hypoxemia. Earlier presentation on the same day for knee pain.  EXAM: PORTABLE CHEST - 1 VIEW  COMPARISON:  Two-view chest 06/18/2013.  FINDINGS: Heart size is normal. Right lower lobe pneumonia is noted. Mild to moderate generalized interstitial pattern is new, compatible with edema. Mild left basilar airspace disease could reflect infection is well. Atelectasis is considered. The visualized soft tissues and bony thorax are unremarkable.  IMPRESSION: 1. New bilateral lower lobe airspace disease, worse on the right, is concerning for pneumonia, particularly on the right. 2. New diffuse interstitial  pattern compatible with edema.   Electronically Signed   By: Gennette Pachris  Mattern M.D.   On: 02/20/2014 17:36    Scheduled Meds: . antiseptic oral rinse  7 mL Mouth Rinse q12n4p  . aspirin EC  81 mg Oral Daily  . buPROPion  150 mg Oral Daily  . chlorhexidine  15 mL Mouth Rinse BID  . diazepam  5 mg Oral 3 times per day  . enoxaparin (LOVENOX) injection  40 mg Subcutaneous Q24H  . folic acid  1 mg Oral Daily  . insulin aspart  0-9 Units Subcutaneous TID WC  . insulin glargine  15 Units Subcutaneous QHS  . ipratropium-albuterol  3 mL Nebulization TID  . lisinopril  20 mg Oral Daily  . LORazepam  0-4 mg Intravenous 4 times per day   Followed by  . LORazepam  0-4 mg Intravenous Q12H  . magnesium sulfate 1 - 4 g bolus IVPB  2 g Intravenous Once  . multivitamin with minerals  1 tablet Oral Daily  . pantoprazole (PROTONIX) IV  40 mg Intravenous Q12H  . piperacillin-tazobactam (ZOSYN)  IV  3.375 g Intravenous Q8H  . [START ON 02/23/2014] potassium chloride  40 mEq Oral Daily  . pravastatin  10 mg Oral Daily  . sertraline  100 mg Oral QHS  . sodium chloride  3 mL Intravenous Q12H  . thiamine  100 mg Oral Daily   Or  . thiamine  100 mg Intravenous Daily  . traZODone  100 mg Oral QHS   Continuous Infusions:     Time spent: 35 minutes    Zyad Boomer  [facility] Pager [phone]. If 7PM-7AM, please contact night-coverage at [email or web address], password [email or web address] 02/22/2014, 11:56 AM  LOS: 2 days

## 2014-02-22 NOTE — ED Provider Notes (Signed)
Medical screening examination/treatment/procedure(s) were conducted as a shared visit with non-physician practitioner(s) or resident and myself. I personally evaluated the patient during the encounter and agree with the findings.  I have personally reviewed any xrays and/ or EKG's with the provider and I agree with interpretation.  Patient with worsening knee pain for the past 3 days, no injuries. Patient has had knee pain on the right side for 3 months. No significant swelling, warmth or fevers. Patient had mild swelling that is improved during the day. On exam patient has mild tenderness with flexion and palpation of the medial aspect of the right knee no significant edema, no significant warmth or rash. Patient will need orthopedic follow-up for this. Patient drinks regular alcohol start having withdrawal symptoms in the ER becoming tachycardic, patient had an episode of V. tach without symptoms. Withdrawal protocol ordered and plan for admission to telemetry.  Rhythm strip reviewed, transient episode of ventricular tachycardia, patient no symptoms during the event.  Alcohol withdrawal, Transient V tach, right knee pain/ arthritis   Enid Skeens, MD 02/22/14 1123

## 2014-02-23 DIAGNOSIS — R768 Other specified abnormal immunological findings in serum: Secondary | ICD-10-CM | POA: Diagnosis present

## 2014-02-23 DIAGNOSIS — F10231 Alcohol dependence with withdrawal delirium: Secondary | ICD-10-CM | POA: Diagnosis not present

## 2014-02-23 DIAGNOSIS — E871 Hypo-osmolality and hyponatremia: Secondary | ICD-10-CM

## 2014-02-23 LAB — COMPREHENSIVE METABOLIC PANEL
ALT: 66 U/L — AB (ref 0–53)
AST: 93 U/L — ABNORMAL HIGH (ref 0–37)
Albumin: 2.2 g/dL — ABNORMAL LOW (ref 3.5–5.2)
Alkaline Phosphatase: 85 U/L (ref 39–117)
Anion gap: 14 (ref 5–15)
BUN: 6 mg/dL (ref 6–23)
CALCIUM: 8.1 mg/dL — AB (ref 8.4–10.5)
CO2: 22 meq/L (ref 19–32)
CREATININE: 0.67 mg/dL (ref 0.50–1.35)
Chloride: 92 mEq/L — ABNORMAL LOW (ref 96–112)
Glucose, Bld: 153 mg/dL — ABNORMAL HIGH (ref 70–99)
Potassium: 3.7 mEq/L (ref 3.7–5.3)
SODIUM: 128 meq/L — AB (ref 137–147)
TOTAL PROTEIN: 6.4 g/dL (ref 6.0–8.3)
Total Bilirubin: 3.2 mg/dL — ABNORMAL HIGH (ref 0.3–1.2)

## 2014-02-23 LAB — OSMOLALITY: Osmolality: 271 mOsm/kg — ABNORMAL LOW (ref 275–300)

## 2014-02-23 LAB — GLUCOSE, CAPILLARY
GLUCOSE-CAPILLARY: 145 mg/dL — AB (ref 70–99)
GLUCOSE-CAPILLARY: 168 mg/dL — AB (ref 70–99)
Glucose-Capillary: 164 mg/dL — ABNORMAL HIGH (ref 70–99)
Glucose-Capillary: 117 mg/dL — ABNORMAL HIGH (ref 70–99)

## 2014-02-23 LAB — HEPATITIS PANEL, ACUTE
HCV AB: REACTIVE — AB
HEP B S AG: NEGATIVE
Hep A IgM: NONREACTIVE
Hep B C IgM: NONREACTIVE

## 2014-02-23 LAB — MAGNESIUM: MAGNESIUM: 1.6 mg/dL (ref 1.5–2.5)

## 2014-02-23 LAB — LIPASE, BLOOD: Lipase: 13 U/L (ref 11–59)

## 2014-02-23 LAB — PHOSPHORUS: Phosphorus: 3.8 mg/dL (ref 2.3–4.6)

## 2014-02-23 MED ORDER — SODIUM CHLORIDE 0.9 % IV SOLN: INTRAVENOUS | Status: DC

## 2014-02-23 MED ORDER — MAGNESIUM SULFATE 40 MG/ML IJ SOLN
2.0000 g | Freq: Once | INTRAMUSCULAR | Status: AC
  Administered 2014-02-23: 2 g via INTRAVENOUS
  Filled 2014-02-23: qty 50

## 2014-02-23 MED ORDER — METOPROLOL TARTRATE 25 MG PO TABS
25.0000 mg | ORAL_TABLET | Freq: Two times a day (BID) | ORAL | Status: DC
  Administered 2014-02-23: 25 mg via ORAL
  Filled 2014-02-23 (×2): qty 1

## 2014-02-23 NOTE — Progress Notes (Signed)
INITIAL NUTRITION ASSESSMENT  DOCUMENTATION CODES Per approved criteria  -Not Applicable   INTERVENTION:  When diet is advanced past clear liquids, provide Glucerna Shake po TID, each supplement provides 220 kcal and 10 grams of protein  Encourage PO intake  RD to continue to monitor  NUTRITION DIAGNOSIS: Increased nutrient (protein) needs related to Hx of substance and ETOH abuse as evidenced by estimated nutritional needs.   Goal: Pt to meet >/= 90% of their estimated nutrition needs   Monitor:  PO and supplemental intake, weight, labs, I/O's  Reason for Assessment: Consult for nutritional assessment   Admitting Dx: Alcohol intoxication with withdrawal/alcoholic ketoacidosis  ASSESSMENT: 58 y/o male with hx of CAD, EtOH dependence, DM2, HTN, depression, HLD who presented to [facility] ED 02/20/2014 with 1 month history of worsening right knee pain which occurred status post fall abbot 3 months prior to this admsision.He also reported drinking alcohol chronically. Additionally, patient started having intractable nausea and vomiting started on the morning of this admission.  PO intake: 40%  Pt was sleeping during visit, no family present. Per RN, pt has been sleeping most of the day. This morning he ate pretty well but did not eat well during lunch. Diet order is clear liquids.  Unable to perform nutrition-focused physical exam at this time.  Pt has LE edema: RLE & LLE  Pt is at nutritional risk d/t Hx of substance and ETOH abuse and poor PO intake. Suspect sub-optimal intake d/t substance abuse and alcohol intake. Per documentation: pt consumes 2 40oz beers a day.   RD to order Glucerna TID when diet is advanced past clear liquids.  Labs reviewed: Low Na Glucose 153 Height: Ht Readings from Last 1 Encounters:  02/20/14 6\' 3"  (1.905 m)    Weight: Wt Readings from Last 1 Encounters:  02/23/14 177 lb 0.5 oz (80.3 kg)    Ideal Body Weight: 196 lb  % Ideal Body Weight:  90%  Wt Readings from Last 10 Encounters:  02/23/14 177 lb 0.5 oz (80.3 kg)  12/28/13 175 lb (79.379 kg)  12/20/12 170 lb (77.111 kg)  08/24/12 174 lb 4.8 oz (79.062 kg)  08/14/12 172 lb (78.019 kg)  04/30/12 170 lb (77.111 kg)  11/29/11 185 lb (83.915 kg)    Usual Body Weight: unable to obtain at this time  % Usual Body Weight: NA  BMI:  Body mass index is 22.13 kg/(m^2).  Estimated Nutritional Needs: Kcal: 2400-2600 Protein: 115-125g Fluid: 2.4L/day  Skin: intact  Diet Order: Diet clear liquid  EDUCATION NEEDS: -No education needs identified at this time   Intake/Output Summary (Last 24 hours) at 02/23/14 1012 Last data filed at 02/23/14 0823  Gross per 24 hour  Intake    820 ml  Output    355 ml  Net    465 ml    Last BM: 10/29  Labs:   Recent Labs Lab 02/21/14 0150 02/22/14 0340 02/23/14 0350  NA 134* 132* 128*  K 3.0* 3.4* 3.7  CL 96 94* 92*  CO2 23 23 22   BUN 5* 6 6  CREATININE 0.58 0.67 0.67  CALCIUM 7.2* 8.0* 8.1*  MG 1.4* 1.6 1.6  GLUCOSE 243* 135* 153*    CBG (last 3)   Recent Labs  02/22/14 1657 02/22/14 2130 02/23/14 0800  GLUCAP 134* 165* 145*    Scheduled Meds: . antiseptic oral rinse  7 mL Mouth Rinse q12n4p  . aspirin EC  81 mg Oral Daily  . buPROPion  150 mg  Oral Daily  . chlorhexidine  15 mL Mouth Rinse BID  . diazepam  5 mg Oral 3 times per day  . enoxaparin (LOVENOX) injection  40 mg Subcutaneous Q24H  . folic acid  1 mg Oral Daily  . insulin aspart  0-9 Units Subcutaneous TID WC  . insulin glargine  15 Units Subcutaneous QHS  . ipratropium-albuterol  3 mL Nebulization TID  . lisinopril  20 mg Oral Daily  . LORazepam  0-4 mg Intravenous Q12H  . multivitamin with minerals  1 tablet Oral Daily  . pantoprazole (PROTONIX) IV  40 mg Intravenous Q12H  . piperacillin-tazobactam (ZOSYN)  IV  3.375 g Intravenous Q8H  . potassium chloride  40 mEq Oral Daily  . sertraline  100 mg Oral QHS  . sodium chloride  3 mL  Intravenous Q12H  . thiamine  100 mg Oral Daily   Or  . thiamine  100 mg Intravenous Daily  . traZODone  100 mg Oral QHS    Continuous Infusions:   Past Medical History  Diagnosis Date  . Diabetes mellitus   . Hypertension   . Coronary artery disease   . MI (myocardial infarction) [id number]    age 58 per patient  . Mental disorder   . Depression     Past Surgical History  Procedure Laterality Date  . Angioplasty  1984    at age [address]    Myisha Pickerel, [place], [place]RD, [place]LDN Pager: [phone] After Hours Pager: [phone]

## 2014-02-23 NOTE — Progress Notes (Addendum)
[facility] PROGRESS NOTE  Thomas CancerJerry W [name] ZOX:[id number] DOB: 03-27-1956 DOA: 02/20/2014 [facility]   Brief narrative 58 year old male with history of coronary artery disease, history of MI, cardiomyopathy, alcohol abuse, type 2 diabetes mellitus, hypertension, depression and hyperlipidemia presented with worsening right knee pain following a fall for past 3 months. Patient reported taking NSAIDs on a daily basis for swelling and redness over the area. Patient also drinks heavily (about 240 ounces daily). He also had intractable nausea and vomiting on the morning of admission. On admission patient had mild neutropenia with WBC of 2.8, hemoglobin of 12.5 and potassium of 3.4 with magnesium of 1.3. UA was positive for ketonuria and proteinuria with an alcohol level of 31. Urine drug screen was positive for opiates and THC. Lipase was mildly elevated to 103 with elevated AST of 285 and AST of 120, bilirubin of 2 and normal alkaline phosphatase . He had an anion gap of 21. Patient was admitted to the floor for alcoholic ketoacidosis along with alcoholic gastritis versus mild pancreatitis. However prior to transfer to the floor patient became extremely dyspneic and was placed on a 50% Venturi mask with chest x-ray showing vague right lower lobe opacity concerning for pneumonia. Patient transferred to stepdown unit for close monitoring. Patient now having alcohol withdrawal symptoms.   Assessment/Plan: Principal problem Alcohol intoxication with withdrawal/alcoholic ketoacidosis -continue step down monitoring on CIWA. Patient is confused with early active withdrawal, having mild tachycardia, confusion and tremulous. High risk of going into DTs.  placed him on scheduled Valium 5 mg 3 times a day. -Continue thiamine, folate and multivitamin. IV fluids stopped due to acute decompensated CHF. repleted low potassium and magnesium. Check phosphorous level. -Social work consult.  Needs counseling on alcohol cessation once more sober.    Active problems Community  acquired pneumonia/? Aspiration pneumonia Patient at high risk for aspiration. Continue empiric Zosyn. Continue with DuoNeb's On Clear liquids  Acute on chronic systolic CHF Secondary to alcoholic cardiomyopathy. Pro  BNP of 5700 on admission. holding fluids. Does not appear volume overloaded at this time. Monitor with daily weights and strict I/O 2-D echo showing EF of 25% with diffuse hypokinesis. -Continue aspirin. added lisinopril and low-dose metoprolol. Will need outpt cardiology follow up prior to discharge.  Hyponatremia Likely hypovolemic. Less likely to be cardiorenal as patient does not appear volume overloaded. No signs of cirrhosis or ascites.check urine sodium, serum and urine osmolality.  Acute alcoholic gastritis/ etoh hepatitis/ mild pancreatitis/ hep C antibody positive Continue with clear liquids. Continue twice a day PPI. Supportive care with antiemetics and pain medications. Ultrasound of the liver showing fatty changes. LFTs are trending down. Noted for elevated total bili. Patient positive for hep C antibody. Likely has not been treated in the past. Needs outpatient GI follow-up. -lipase is now normal. We'll advance diet once more awake and oriented   Type 2 diabetes mellitus Continue bedtime Lantus with moderate sliding scale insulin. hb A1c of 7.3.    Essential hypertension Continue lisinopril. Added low-dose metoprolol  Depression Continue home dose trazodone , Zoloft and bupropion  CAD with hx of  MI Continue aspirin. Hold statin given transaminitis.  low-dose metoprolol.needs cardiology follow-up as outpatient  Protein calorie malnutrition Nutrition consult.  Diet: Clear liquids   DVT prophylaxis: Lovenox  Code Status: Full code Family Communication: none at bedside today. Spoke with sister and niece on 10/31. [place] [facility] [place]  Disposition Plan: Continue monitoring stepdown  unit today. Can't transfer out to telemetry in the morning if more stable.   Consultants:  None  Procedures:  None  Antibiotics:  IV Zosyn(10/29--)  HPI/Subjective: Patient seen and examined.he is still confused and sleepy however less tremulous and less tachycardic today  Objective: Filed Vitals:   02/23/14 0800  BP: 148/98  Pulse: 110  Temp: 98.7 F (37.1 C)  Resp: 23    Intake/Output Summary (Last 24 hours) at 02/23/14 1111 Last data filed at 02/23/14 1610  Gross per 24 hour  Intake    820 ml  Output    355 ml  Net    465 ml   Filed Weights   02/20/14 1825 02/23/14 0400  Weight: 78.5 kg (173 lb 1 oz) 80.3 kg (177 lb 0.5 oz)    Exam:   General:  Elderly male lying in bed appears confused and sleepy  HEENT: No pallor, moist oral mucosa  Cardiovascular: S1 and S2 tachycardic, no murmurs rubs or gallop  Respiratory: Clear to auscultation bilaterally, no added sounds  Abdomen: , Nondistended, nontender, bowel sounds present  Musculoskeletal: warm, no edema  CNS: Awake and oriented to self,  Has slurred speech and tremulous( less today)  Data Reviewed: Basic Metabolic Panel:  Recent Labs Lab 02/20/14 1228 02/21/14 0150 02/22/14 0340 02/23/14 0350  NA 140 134* 132* 128*  K 3.4* 3.0* 3.4* 3.7  CL 98 96 94* 92*  CO2 21 23 23 22   GLUCOSE 192* 243* 135* 153*  BUN 7 5* 6 6  CREATININE 0.60 0.58 0.67 0.67  CALCIUM 7.8* 7.2* 8.0* 8.1*  MG 1.3* 1.4* 1.6 1.6   Liver Function Tests:  Recent Labs Lab 02/20/14 1228 02/21/14 0150 02/22/14 1253 02/23/14 0350  AST 285* 198* 138* 93*  ALT 120* 97* 82* 66*  ALKPHOS 98 87 89 85  BILITOT 2.0* 2.2* 2.8* 3.2*  PROT 7.3 6.4 6.7 6.4  ALBUMIN 2.8* 2.4* 2.3* 2.2*    Recent Labs Lab 02/20/14 1228 02/23/14 0800  LIPASE 103* 13   No results for input(s): AMMONIA in the last 168 hours. CBC:  Recent Labs Lab 02/20/14 1228 02/21/14 0150  02/22/14 0340  WBC 2.8* 3.2* 3.7*  NEUTROABS 1.6*  --   --   HGB 12.5* 11.4* 12.6*  HCT 34.7* 33.8* 36.3*  MCV 89.9 90.6 91.9  PLT 158 139* 146*   Cardiac Enzymes:  Recent Labs Lab 02/20/14 1408 02/20/14 1941 02/21/14 0150  TROPONINI <0.30 <0.30 <0.30   BNP (last 3 results)  Recent Labs  02/20/14 1939  PROBNP 5707.0*   CBG:  Recent Labs Lab 02/22/14 0747 02/22/14 1134 02/22/14 1657 02/22/14 2130 02/23/14 0800  GLUCAP 121* 207* 134* 165* 145*    Recent Results (from the past 240 hour(s))  MRSA PCR Screening     Status: None   Collection Time: 02/20/14  6:48 PM  Result Value Ref Range Status   MRSA by PCR NEGATIVE NEGATIVE Final    Comment:        The GeneXpert MRSA Assay (FDA approved for NASAL specimens only), is one component of a comprehensive MRSA colonization surveillance program. It is not intended to diagnose MRSA infection nor to guide or monitor treatment for MRSA infections.     Studies: No results found.  Scheduled Meds: . antiseptic oral rinse  7 mL Mouth Rinse q12n4p  . aspirin EC  81 mg Oral Daily  . buPROPion  150 mg Oral Daily  . chlorhexidine  15 mL Mouth Rinse BID  .  diazepam  5 mg Oral 3 times per day  . enoxaparin (LOVENOX) injection  40 mg Subcutaneous Q24H  . folic acid  1 mg Oral Daily  . insulin aspart  0-9 Units Subcutaneous TID WC  . insulin glargine  15 Units Subcutaneous QHS  . ipratropium-albuterol  3 mL Nebulization TID  . lisinopril  20 mg Oral Daily  . LORazepam  0-4 mg Intravenous Q12H  . multivitamin with minerals  1 tablet Oral Daily  . pantoprazole (PROTONIX) IV  40 mg Intravenous Q12H  . piperacillin-tazobactam (ZOSYN)  IV  3.375 g Intravenous Q8H  . potassium chloride  40 mEq Oral Daily  . sertraline  100 mg Oral QHS  . sodium chloride  3 mL Intravenous Q12H  . thiamine  100 mg Oral Daily   Or  . thiamine  100 mg Intravenous Daily  . traZODone  100 mg Oral QHS   Continuous Infusions:     Time  spent: 35 minutes    Aneira Cavitt  [facility] Pager [phone]. If 7PM-7AM, please contact night-coverage at [email or web address], password [facility] 02/23/2014, 11:11 AM  LOS: 3 days

## 2014-02-24 ENCOUNTER — Encounter (HOSPITAL_COMMUNITY): Payer: Self-pay | Admitting: Physician Assistant

## 2014-02-24 DIAGNOSIS — F10129 Alcohol abuse with intoxication, unspecified: Secondary | ICD-10-CM | POA: Diagnosis present

## 2014-02-24 DIAGNOSIS — I5022 Chronic systolic (congestive) heart failure: Secondary | ICD-10-CM

## 2014-02-24 DIAGNOSIS — I5023 Acute on chronic systolic (congestive) heart failure: Secondary | ICD-10-CM | POA: Diagnosis present

## 2014-02-24 DIAGNOSIS — I110 Hypertensive heart disease with heart failure: Secondary | ICD-10-CM | POA: Diagnosis present

## 2014-02-24 DIAGNOSIS — I10 Essential (primary) hypertension: Secondary | ICD-10-CM

## 2014-02-24 DIAGNOSIS — J9601 Acute respiratory failure with hypoxia: Secondary | ICD-10-CM | POA: Diagnosis present

## 2014-02-24 LAB — COMPREHENSIVE METABOLIC PANEL
ALK PHOS: 81 U/L (ref 39–117)
ALT: 50 U/L (ref 0–53)
AST: 58 U/L — ABNORMAL HIGH (ref 0–37)
Albumin: 2.1 g/dL — ABNORMAL LOW (ref 3.5–5.2)
Anion gap: 14 (ref 5–15)
BUN: 9 mg/dL (ref 6–23)
CALCIUM: 8.1 mg/dL — AB (ref 8.4–10.5)
CO2: 22 meq/L (ref 19–32)
Chloride: 95 mEq/L — ABNORMAL LOW (ref 96–112)
Creatinine, Ser: 0.72 mg/dL (ref 0.50–1.35)
GFR calc non Af Amer: >90 mL/min (ref >90)
Glucose, Bld: 123 mg/dL — ABNORMAL HIGH (ref 70–99)
POTASSIUM: 3.8 meq/L (ref 3.7–5.3)
SODIUM: 131 meq/L — AB (ref 137–147)
Total Bilirubin: 3 mg/dL — ABNORMAL HIGH (ref 0.3–1.2)
Total Protein: 6.3 g/dL (ref 6.0–8.3)

## 2014-02-24 LAB — GLUCOSE, CAPILLARY
GLUCOSE-CAPILLARY: 142 mg/dL — AB (ref 70–99)
Glucose-Capillary: 94 mg/dL (ref 70–99)
Glucose-Capillary: 142 mg/dL — ABNORMAL HIGH (ref 70–99)
Glucose-Capillary: 83 mg/dL (ref 70–99)

## 2014-02-24 LAB — PRO B NATRIURETIC PEPTIDE: Pro B Natriuretic peptide (BNP): 5308 pg/mL — ABNORMAL HIGH (ref 0–125)

## 2014-02-24 MED ORDER — GLUCERNA SHAKE PO LIQD
237.0000 mL | Freq: Three times a day (TID) | ORAL | Status: DC
  Administered 2014-02-25 – 2014-02-27 (×7): 237 mL via ORAL
  Filled 2014-02-24 (×10): qty 237

## 2014-02-24 MED ORDER — CARVEDILOL 3.125 MG PO TABS
3.1250 mg | ORAL_TABLET | Freq: Two times a day (BID) | ORAL | Status: DC
  Administered 2014-02-24 – 2014-02-27 (×6): 3.125 mg via ORAL
  Filled 2014-02-24 (×9): qty 1

## 2014-02-24 MED ORDER — LISINOPRIL 5 MG PO TABS
5.0000 mg | ORAL_TABLET | Freq: Every day | ORAL | Status: DC
Start: 2014-02-24 — End: 2014-02-27
  Administered 2014-02-24 – 2014-02-27 (×4): 5 mg via ORAL
  Filled 2014-02-24 (×4): qty 1

## 2014-02-24 MED ORDER — FUROSEMIDE 10 MG/ML IJ SOLN
40.0000 mg | Freq: Two times a day (BID) | INTRAMUSCULAR | Status: DC
  Administered 2014-02-24 – 2014-02-25 (×2): 40 mg via INTRAVENOUS
  Filled 2014-02-24 (×4): qty 4

## 2014-02-24 MED ORDER — IPRATROPIUM-ALBUTEROL 0.5-2.5 (3) MG/3ML IN SOLN: 3.0000 mL | RESPIRATORY_TRACT | Status: DC | PRN

## 2014-02-24 MED ORDER — FUROSEMIDE 10 MG/ML IJ SOLN: 40.0000 mg | Freq: Once | INTRAMUSCULAR | Status: DC

## 2014-02-24 MED ORDER — POTASSIUM CHLORIDE CRYS ER 20 MEQ PO TBCR
40.0000 meq | EXTENDED_RELEASE_TABLET | Freq: Once | ORAL | Status: AC
  Administered 2014-02-24: 40 meq via ORAL
  Filled 2014-02-24: qty 2

## 2014-02-24 MED ORDER — LEVOFLOXACIN 750 MG PO TABS
750.0000 mg | ORAL_TABLET | Freq: Every day | ORAL | Status: DC
  Administered 2014-02-24 – 2014-02-27 (×4): 750 mg via ORAL
  Filled 2014-02-24 (×4): qty 1

## 2014-02-24 MED ORDER — LORAZEPAM 2 MG/ML IJ SOLN
1.0000 mg | Freq: Three times a day (TID) | INTRAMUSCULAR | Status: DC | PRN
  Administered 2014-02-24 – 2014-02-25 (×2): 1 mg via INTRAVENOUS
  Filled 2014-02-24 (×2): qty 1

## 2014-02-24 NOTE — Progress Notes (Addendum)
Patient ID: [name], male   DOB: Oct 23, 1955, 58 y.o.   MRN: [id number] [facility] PROGRESS NOTE  [name] EVO:[id number] DOB: 1955-09-06 DOA: 02/20/2014 PCP: PROVIDER NOT IN SYSTEM  Brief narrative: 58 year old male with history of coronary artery disease, history of MI, cardiomyopathy, alcohol abuse, type 2 diabetes mellitus, hypertension, depression and hyperlipidemia presented with worsening right knee pain following a fall for past 3 months. Patient reported taking NSAIDs on a daily basis for swelling and redness over the knee area. Patient also drinks heavily (about 240 ounces daily). He also had intractable nausea and vomiting on the morning of admission. On admission patient had mild neutropenia with WBC of 2.8, hemoglobin of 12.5 and potassium of 3.4 with magnesium of 1.3. UA was positive for ketonuria and proteinuria with an alcohol level of 31. Urine drug screen was positive for opiates and THC. Lipase was mildly elevated to 103 with elevated AST of 285 and AST of 120, bilirubin of 2 and normal alkaline phosphatase . He had an anion gap of 21. Patient was admitted to the floor for alcoholic ketoacidosis along with alcoholic gastritis versus mild pancreatitis. However prior to transfer to the floor patient became extremely dyspneic and was placed on a 50% Venti mask with chest x-ray showing vague right lower lobe opacity concerning for pneumonia. Patient transferred to stepdown unit for close monitoring.  Over past 24 hours patient remains stable and is stable for transfer to telemetry floor today.   Assessment/Plan:    Principal problem Alcohol intoxication with withdrawal / alcoholic ketoacidosis  Alcohol level was 31 on the admission. Urine drug screen positive for opiates and THC. Patient was started on CIWA protocol.  On admission, patient was confused and had signs of early active withdrawal considering mild tachycardia, confusion and tremulousness.  Over  past 24 - 48 hours, patient remains stable. No further signs of withdrawal.  Patient is on scheduled Valium 5 mg by mouth every 8 hours.   Patient is medically stable to be transferred to telemetry floor today. Order placed.  Continue folic acid, thiamine and multivitamin.Stopped IV Fluids. Regular diet.   Active problems Community acquired pneumonia/? Aspiration pneumonia  Patient at high risk for aspiration considering history of alcohol abuse, alcohol intoxication.   Patient was started on empiric Zosyn. Respiratory status is stable. We will change from Zosyn to Levaquin starting today.  Continue nebulizer treatments on an as-needed basis.  Acute on chronic systolic and diastolic CHF  Secondary to alcoholic cardiomyopathy. Pro BNP of 5700 on admission. Stopped IVF. Does not appear volume overloaded, no LE edema. Monitor with daily weights and strict I/O. Will ask for cardiology consultation and recommendations.  Weight seems to be up since admission, 173 lbs --> 177 lbs. Will give 1 dose of bolus lasix and recheck BNP.   2-D echo on this admission showing EF of 25% with diffuse hypokinesis.  Continue aspirin.Now on lisinopril and metoprolol but those medication can be changed by cardiology.  Hyponatremia  Patient does not appear volume overloaded. Serum osmolarity 271. For some reason urine osmolarity not collected but it was ordered. Order placed for urine osmolarity today.  Hyponatremia likely because of alcohol abuse.  Sodium 131 this morning.  Acute alcoholic gastritis/ etoh hepatitis/ mild pancreatitis/ hep C antibody positive  Ultrasound of the liver showed fatty changes. LFTs are improving. Noted for elevated total bili. Patient positive for hep C antibody. Likely has not been treated in the past. Needs outpatient GI follow-up.  Lipase  is now normal, was 103 on admission. Diet advacned to regular this am.   Continue Protonix 40 mg IV every 12  hours.  Pancytopenia  Likely secondary to bone marrow suppression from alcohol abuse. White blood cell count is 3.7, hemoglobin 12.6 and platelets 146.  Type 2 diabetes mellitus  Continue bedtime Lantus with moderate sliding scale insulin. hgb A1c of 7.3 indicating good glycemic control.  CBG's 94 - 168.  Essential hypertension  Continue lisinopril and metoprolol.  Depression  Continue home dose trazodone, Zoloft and bupropion  CAD with hx of MI  Continue aspirin. Held statin given transaminitis. Started metoprolol 25 mg PO BID.   Protein calorie malnutrition  Nutrition consulted.    DVT prophylaxis  Lovenox subQ ordered.   Code Status: Full code Family Communication: none at bedside today. Sister and niece updated on 10/31. [facility] lives [facility] roommate [facility] Disposition Plan: Continue monitoring stepdown unit today. Can't transfer out to telemetry in the morning if more stable.   IV Access:   Peripheral IV Procedures and diagnostic studies:   No results found. Medical Consultants:   Cardiology 02/24/2014   Other Consultants:   Physical therapy  Anti-Infectives:   Zosyn 02/20/2014 --> 02/24/2014 Levaquin 02/24/2014 --!>   Manson PasseyEVINE, Kavya Haag, MD  [facility] Pager [phone]  If 7PM-7AM, please contact night-coverage [email or web address] Password [email or web address] 02/24/2014, 8:16 AM   LOS: 4 days    HPI/Subjective: No acute overnight events.  Objective: Filed Vitals:   02/23/14 2200 02/24/14 0000 02/24/14 0400 02/24/14 0500  BP: 101/87 99/76 87/42    Pulse:  84 88   Temp:    98 F (36.7 C)  TempSrc:    Axillary  Resp:  18 32   Height:      Weight:      SpO2:  99% 96%     Intake/Output Summary (Last 24 hours) at 02/24/14 0816 Last data filed at 02/24/14 0600  Gross per 24 hour  Intake 878.33 ml  Output    250 ml  Net 628.33 ml    Exam:   General:  Pt is not in acute distress  Cardiovascular: Regular rate and rhythm, S1/S2,  no murmurs  Respiratory: no wheezing, no crackles, no rhonchi  Abdomen: Soft, non tender, non distended, bowel sounds present  Extremities: No edema, pulses DP and PT palpable bilaterally  Neuro: Grossly nonfocal  Data Reviewed: Basic Metabolic Panel:  Recent Labs Lab 02/20/14 1228 02/21/14 0150 02/22/14 0340 02/23/14 0350 02/23/14 0825 02/24/14 0500  NA 140 134* 132* 128*  --  131*  K 3.4* 3.0* 3.4* 3.7  --  3.8  CL 98 96 94* 92*  --  95*  CO2 21 23 23 22   --  22  GLUCOSE 192* 243* 135* 153*  --  123*  BUN 7 5* 6 6  --  9  CREATININE 0.60 0.58 0.67 0.67  --  0.72  CALCIUM 7.8* 7.2* 8.0* 8.1*  --  8.1*  MG 1.3* 1.4* 1.6 1.6  --   --   PHOS  --   --   --   --  3.8  --    Liver Function Tests:  Recent Labs Lab 02/20/14 1228 02/21/14 0150 02/22/14 1253 02/23/14 0350 02/24/14 0500  AST 285* 198* 138* 93* 58*  ALT 120* 97* 82* 66* 50  ALKPHOS 98 87 89 85 81  BILITOT 2.0* 2.2* 2.8* 3.2* 3.0*  PROT 7.3 6.4 6.7 6.4 6.3  ALBUMIN 2.8* 2.4* 2.3*  2.2* 2.1*    Recent Labs Lab 02/20/14 1228 02/23/14 0800  LIPASE 103* 13   No results for input(s): AMMONIA in the last 168 hours. CBC:  Recent Labs Lab 02/20/14 1228 02/21/14 0150 02/22/14 0340  WBC 2.8* 3.2* 3.7*  NEUTROABS 1.6*  --   --   HGB 12.5* 11.4* 12.6*  HCT 34.7* 33.8* 36.3*  MCV 89.9 90.6 91.9  PLT 158 139* 146*   Cardiac Enzymes:  Recent Labs Lab 02/20/14 1408 02/20/14 1941 02/21/14 0150  TROPONINI <0.30 <0.30 <0.30   BNP: Invalid input(s): POCBNP CBG:  Recent Labs Lab 02/22/14 2130 02/23/14 0800 02/23/14 1203 02/23/14 1757 02/23/14 2148  GLUCAP 165* 145* 164* 117* 168*     Collection Time: 02/20/14  6:48 PM  Result Value Ref Range Status   MRSA by PCR NEGATIVE NEGATIVE Final    Comment:           Scheduled Meds: . aspirin EC  81 mg Oral Daily  . buPROPion  150 mg Oral Daily  . diazepam  5 mg Oral 3 times per day  . enoxaparin (LOVENOX) injection  40 mg Subcutaneous Q24H   . folic acid  1 mg Oral Daily  . insulin aspart  0-9 Units Subcutaneous TID WC  . insulin glargine  15 Units Subcutaneous QHS  . ipratropium-albuterol  3 mL Nebulization TID  . lisinopril  20 mg Oral Daily  . LORazepam  0-4 mg Intravenous Q12H  . metoprolol tartrate  25 mg Oral BID  . multivitamin   1 tablet Oral Daily  . pantoprazole   40 mg Intravenous Q12H  . piperacillin-tazobactam   3.375 g Intravenous Q8H  . potassium chloride  40 mEq Oral Daily  . sertraline  100 mg Oral QHS  . thiamine  100 mg Oral Daily  . traZODone  100 mg Oral QHS   Continuous Infusions: . sodium chloride 50 mL/hr at 02/23/14 1850

## 2014-02-24 NOTE — Progress Notes (Addendum)
ANTIBIOTIC CONSULT NOTE - INITIAL  Pharmacy Consult for levofloxacin Indication: pneumonia, possible aspiration  No Active Allergies  Patient Measurements: Height: 6\' 3"  (190.5 cm) Weight: 177 lb 0.5 oz (80.3 kg) IBW/kg (Calculated) : 84.5 Adjusted Body Weight:   Vital Signs: Temp: 98.4 F (36.9 C) (11/02 0800) Temp Source: Oral (11/02 0800) BP: 118/81 mmHg (11/02 1029) Pulse Rate: 87 (11/02 1029) Intake/Output from previous day: 11/01 0701 - 11/02 0700 In: 998.3 [P.O.:240; I.V.:558.3; IV Piggyback:200] Out: 250 [Urine:250] Intake/Output from this shift: Total I/O In: 100 [I.V.:100] Out: -   Labs:  Recent Labs  02/22/14 0340 02/23/14 0350 02/24/14 0500  WBC 3.7*  --   --   HGB 12.6*  --   --   PLT 146*  --   --   CREATININE 0.67 0.67 0.72   Estimated Creatinine Clearance: 112.9 mL/min (by C-G formula based on Cr of 0.72). No results for input(s): VANCOTROUGH, VANCOPEAK, VANCORANDOM, GENTTROUGH, GENTPEAK, GENTRANDOM, TOBRATROUGH, TOBRAPEAK, TOBRARND, AMIKACINPEAK, AMIKACINTROU, AMIKACIN in the last 72 hours.   Microbiology: Recent Results (from the past 720 hour(s))  MRSA PCR Screening     Status: None   Collection Time: 02/20/14  6:48 PM  Result Value Ref Range Status   MRSA by PCR NEGATIVE NEGATIVE Final    Comment:        The GeneXpert MRSA Assay (FDA approved for NASAL specimens only), is one component of a comprehensive MRSA colonization surveillance program. It is not intended to diagnose MRSA infection nor to guide or monitor treatment for MRSA infections.    Medical History: Past Medical History  Diagnosis Date  . Diabetes mellitus   . Hypertension   . Coronary artery disease   . MI (myocardial infarction) 71    age 58 per patient  . Mental disorder   . Depression    Assessment: 58 YOM admitted for EtOH intoxication and EtOH ketoacidosis.  Patient was started on zosyn for possible CAP/aspiration.  Orders to change to levofloxacin per  pharmacy  10/29 >> zosyn  >> 11/1 11/1 >> levofloxacin  >>    Tmax:m afeb WBCs: leukopenia Renal: Scr WNL  No culture data  Drug level / dose changes info:   Goal of Therapy:  Dose for renal function and indication  Plan:   Start levofloxacin 750mg  PO q24h as patient taking other PO meds and on regular diet  No further dose adjustment needed  Juliette Alcide, PharmD, BCPS.   Pager: [phone]  02/24/2014,11:41 AM

## 2014-02-24 NOTE — Consult Note (Signed)
CARDIOLOGY CONSULT NOTE   Patient ID: CHRISTINA HODSDON MRN: [id number], DOB/AGE: [birth year]   Admit date: 02/20/2014 Date of Consult: 02/24/2014   Primary Physician: [facility] in [place] Primary Cardiologist: previously seen by Dr. Salayah Meares/Dr. Clifton James  Pt. Profile  58 year old African-American male with past medical history of hypertension, hyperlipidemia, diabetes, chronic systolic and diastolic HF, NICM, remote history of coronary artery disease status post angioplasty in 1984, depression and polysubstance abuse presented with N/V, AKA, R knee pain and had worsening SOB after admission. EF down from previous 40% to 25%   Problem List  Past Medical History  Diagnosis Date  . Diabetes mellitus   . Hypertension   . Coronary artery disease   . MI (myocardial infarction) 58    age 62 per patient  . Mental disorder   . Depression     Past Surgical History  Procedure Laterality Date  . Angioplasty  1984    at age 69     Allergies  No Active Allergies  HPI   The patient is a 58 year old African-American male with past medical history of hypertension, hyperlipidemia, diabetes, chronic systolic and diastolic HF, NICM, remote history of coronary artery disease status post angioplasty in 1984, depression and polysubstance abuse. He was admitted in April 2014 for acute CHF, at that time echocardiogram was obtained which showed EF 40-45%, akinesis of mid inferoseptal myocardium, akinesis of basal mid inferior myocardium, and akinesis of basal anteroseptal epicardium, grade 2 diastolic dysfunction, mild MR. He eventually underwent cardiac catheterization on 08/13/2012 which showed cardiac index 2.89, cardiac output 5.96, 30% ostial left circumflex stenosis, 40% ostial ramus intermedius stenosis, 30% proximal RCA stenosis, 50% proximal LAD stenosis. It was felt he has nonischemic cardiomyopathy and moderate nonobstructive CAD. Patient was supposed to follow-up in May 2014 with Dr.  Shirlee Latch, however failed to follow-up.  Since his April 2014 visit, patient continued to smoke half a pack per day, drink 2-3 bottles of beer per day, and does occasional cocaine and marijuana. He was recently discharged in September 2014 from psychiatric service for uncontrolled alcohol and cocaine abuse. According to the patient, he has not been very active at home, however denies any exertional chest pain for the last several years. He does however has some mild dyspnea on exertion at baseline. He states his lower extremity edema has been going on and off, he has occasional paroxysmal nocturnal dyspnea, however nothing recent. He sleeps on one pillow at night. He denies any recent fever, chill, or cough. According to the patient, he recently injured his right knee after a fall 3 months ago. He has been taking lots of NSAID for swelling and redness of the area. According to his sister, he has been having trouble sleeping due to the pain. Two days before his this hospital visit, patient began to have significant amount of nausea and vomiting. He was also noncompliant with his medication for the past month. Patient finally decided to seek medical attention at [facility] on 02/20/2014.  He was hydrated aggressively in the ED and was admitted by internal medicine group. Afterward, he had significant shortness of breath require 50% Venturi mask. Chest x-ray was concerning for pneumonia/heart failure. proBNP > 5000. He was given 40 mg IV Lasix. Echocardiogram was obtained on 10/30 which showed EF 25%, diffuse hypokinesis, mild MR, PA peak pressure 44. Cardiology has been consulted for heart failure.  Inpatient Medications  . antiseptic oral rinse  7 mL Mouth Rinse q12n4p  . aspirin EC  81 mg Oral Daily  . buPROPion  150 mg Oral Daily  . chlorhexidine  15 mL Mouth Rinse BID  . diazepam  5 mg Oral 3 times per day  . enoxaparin (LOVENOX) injection  40 mg Subcutaneous Q24H  . feeding supplement  (GLUCERNA SHAKE)  237 mL Oral TID BM  . folic acid  1 mg Oral Daily  . furosemide  40 mg Intravenous Once  . insulin aspart  0-9 Units Subcutaneous TID WC  . insulin glargine  15 Units Subcutaneous QHS  . levofloxacin  750 mg Oral Daily  . lisinopril  20 mg Oral Daily  . metoprolol tartrate  25 mg Oral BID  . multivitamin with minerals  1 tablet Oral Daily  . pantoprazole (PROTONIX) IV  40 mg Intravenous Q12H  . sertraline  100 mg Oral QHS  . sodium chloride  3 mL Intravenous Q12H  . thiamine  100 mg Oral Daily   Or  . thiamine  100 mg Intravenous Daily  . traZODone  100 mg Oral QHS    Family History Family History  Problem Relation Age of Onset  . Heart attack Father     >60s  . Hypertension Father   . Diabetes Father   . Heart attack Sister   . Hypertension Mother      Social History History   Social History  . Marital Status: Widowed    Spouse Name: N/A    Number of Children: N/A  . Years of Education: N/A   Occupational History  . Not on file.   Social History Main Topics  . Smoking status: Current Every Day Smoker -- 0.50 packs/day    Types: Cigarettes  . Smokeless tobacco: Never Used  . Alcohol Use: 1.2 oz/week    2 Cans of beer per week     Comment: drinking daily (2 40oz beer a day)  . Drug Use: 7.00 per week    Special: Cocaine, Marijuana     Comment: last use about 6 months ago  . Sexual Activity: Yes   Other Topics Concern  . Not on file   Social History Narrative     Review of Systems  General:  No chills, fever, night sweats or weight changes.  Cardiovascular:  No chest pain, orthopnea, palpitations, paroxysmal nocturnal dyspnea. +Edema, dyspnea on exertion Dermatological: No rash, lesions/masses Respiratory: No cough + dyspnea Urologic: No hematuria, dysuria Abdominal:   No nausea, vomiting, diarrhea, bright red blood per rectum, melena, or hematemesis Neurologic:  No visual changes, wkns, changes in mental status. All other systems  reviewed and are otherwise negative except as noted above.  Physical Exam  Blood pressure 118/81, pulse 87, temperature 98.9 F (37.2 C), temperature source Oral, resp. rate 19, height 6\' 3"  (1.905 m), weight 177 lb 0.5 oz (80.3 kg), SpO2 100 %.  General: Pleasant, NAD Psych: Normal affect. Neuro: Alert and oriented X 3. Moves all extremities spontaneously. HEENT: Normal  Neck: Supple without bruits.  JVP 8-9 cm. Lungs:  Decreased breath sounds at bases.  Heart: RRR no s3, s4, or murmurs. Abdomen: Soft, non-tender, non-distended, BS + x 4.  Extremities: No clubbing, cyanosis or edema. DP/PT/Radials 2+ and equal bilaterally.  Labs  No results for input(s): CKTOTAL, CKMB, TROPONINI in the last 72 hours. Lab Results  Component Value Date   WBC 3.7* 02/22/2014   HGB 12.6* 02/22/2014   HCT 36.3* 02/22/2014   MCV 91.9 02/22/2014   PLT 146* 02/22/2014     Recent  Labs Lab 02/24/14 0500  NA 131*  K 3.8  CL 95*  CO2 22  BUN 9  CREATININE 0.72  CALCIUM 8.1*  PROT 6.3  BILITOT 3.0*  ALKPHOS 81  ALT 50  AST 58*  GLUCOSE 123*   Lab Results  Component Value Date   CHOL 138 08/12/2012   HDL 43 08/12/2012   LDLCALC 81 08/12/2012   TRIG 72 08/12/2012   No results found for: DDIMER  Radiology/Studies  US Abdomen Complete  02/21/2014   CLINICAL DATA:  Elevated liver function tests and lipase  EXAM: ULTRASOUND ABDOMEN COMPLETE  COMPARISON:  CT 10/23/2008  FINDINGS: Gallbladder: No gallstones or wall thickening visualized. No sonographic Murphy sign noted.  Common bile duct: Diameter: 3 mm  Liver: Hepatic increased echogenicity likely indicates underlying steatosis without focal abnormality or intrahepatic ductal dilatation.  IVC: No abnormality visualized.  Pancreas: Visualized portion unremarkable.  Spleen: Size and appearance within normal limits.  Right Kidney: Length: 13 cm. Echogenicity within normal limits. No mass or hydronephrosis visualized.  Left Kidney: Length: 12.7  cm. Echogenicity within normal limits. No mass or hydronephrosis visualized.  Abdominal aorta: No aneurysm visualized.  Other findings: None.  IMPRESSION: Hepatic steatosis without acute intra-abdominal abnormality.   Electronically Signed   By: Christiana Pellant M.D.   On: 02/21/2014 08:21   Dg Chest Port 1 View  02/20/2014   CLINICAL DATA:  Progressive shortness of breath and hypoxemia. Earlier presentation on the same day for knee pain.  EXAM: PORTABLE CHEST - 1 VIEW  COMPARISON:  Two-view chest 06/18/2013.  FINDINGS: Heart size is normal. Right lower lobe pneumonia is noted. Mild to moderate generalized interstitial pattern is new, compatible with edema. Mild left basilar airspace disease could reflect infection is well. Atelectasis is considered. The visualized soft tissues and bony thorax are unremarkable.  IMPRESSION: 1. New bilateral lower lobe airspace disease, worse on the right, is concerning for pneumonia, particularly on the right. 2. New diffuse interstitial pattern compatible with edema.   Electronically Signed   By: Gennette Pac M.D.   On: 02/20/2014 17:36   Dg Knee Complete 4 Views Right  02/20/2014   CLINICAL DATA:  Knee pain  EXAM: RIGHT KNEE - COMPLETE 4+ VIEW  COMPARISON:  12/28/2013  FINDINGS: No acute fracture or dislocation is identified. No gross soft tissue abnormality is seen. Diffuse vascular calcifications are noted.  IMPRESSION: No acute abnormality noted.   Electronically Signed   By: Alcide Clever M.D.   On: 02/20/2014 10:44    ECG  10/29, sinus tach HR 110, nonspecific ST-T wave changes  ASSESSMENT AND PLAN  1. Acute on chronic systolic and diastolic HF  - maybe related to worsening LV function and aggressive IV hydration on initial admission, NSAID use and dietary indiscretion  - EF 40-45% in Apr 2015, down to 25% on echo this admission, likely nonischemic, denies any CP for yrs. No urgent need for ischemic eval per Dr. Shirlee Latch  - elevated proBNP, will continue  diuresis  - change metoprolol to coreg, decrease lisinopril down to 5mg . Will uptitrate med tomorrow as BP allow  2. H/o NICM  - diagnosed with cath 07/2012 nonobstructive CAD  3. CAD with nonobstructive CAD on cath 07/2012  - remote angioplasty in 1984 at age 62  - Left and Right heart cath 07/2012 nonobstructive CAD  4. Alcoholic ketoacidosis: per primary team 5. Hypertension 6. Hyperlipidemia 7. Diabetes 8. depression  9. polysubstance abuse (tobacco, EtOH, cocaine and marijuana) 10. Medical  noncompliance  Signed, Azalee CourseMeng, Hao, [place] JerseyPA-C 02/24/2014, 1:17 PM  Patient seen with PA, agree with the above note.  1. Alcoholic ketoacidosis: Per primary service.  2. Acute on chronic systolic CHF: Echo with EF 25%, diffuse hypokinesis.  He had cardiac cath in 2014 with nonobstructive CAD; EF was 40-45% at that time.  He has not taken any medications or followed up with cardiology and has continued to drink heavily and to use cocaine.  On exam today, he is mildly volume overloaded.  No chest pain. Has chronic exertional dyspnea.  - Would decrease lisinopril to 5 mg daily given soft BP.  - Stop metoprolol, start Coreg 3.125 mg bid (has alpha blocking properties given risk patient will use cocaine).   - I will give him Lasix 40 mg IV bid today and reassess volume status in am.  - Needs to stop ETOH and cocaine completely, discussed with patient.   Marca AnconaDalton Temeca Somma 02/24/2014 1:49 PM

## 2014-02-24 NOTE — Progress Notes (Signed)
Nutrition Note  RD received consult for assessment. Pt seen and evaluated on 02/23/2014. Will order Glucerna shakes TID per previous RD recommendations.  Will continue to monitor. Please consult as needed.  Lloyd Huger MS RD LDN Clinical Dietitian Pager:[phone]

## 2014-02-24 NOTE — Care Management Note (Signed)
    Page 1 of 2   02/24/2014     3:43:36 PM CARE MANAGEMENT NOTE 02/24/2014  Patient:  ALEXAN, MIGNOGNA   Account Number:  [id number]  Date Initiated:    Documentation initiated by:    Subjective/Objective Assessment:   58 year old African-American male /past medical history of htn,hyperlipidemia, diabetes, ef 35%, NICM, remote hx. of coronary artery disease status post angioplasty in 1984, depression and polysubstance and etoh abuse     Action/Plan:   tbd by psych   Anticipated DC Date:  02/27/2014   Anticipated DC Plan:  HOME/SELF CARE  In-house referral  Clinical Social Worker      DC Planning Services  CM consult      [facility] Facility Choice  NA   Choice offered to / List presented to:  NA   DME arranged  NA      DME agency  NA     HH arranged  NA      HH agency  NA   Status of service:  In process, will continue to follow Medicare Important Message given?   (If response is "NO", the following Medicare IM given date fields will be blank) Date Medicare IM given:   Medicare IM given by:   Date Additional Medicare IM given:   Additional Medicare IM given by:    Discharge Disposition:    Per UR Regulation:  Reviewed for med. necessity/level of care/duration of stay  If discussed at Long Length of Stay Meetings, dates discussed:    Comments:  [id number]/Zailee Vallely Earlene Plater, RN, BSN, CCM Chart reviewed. Discharge needs and patient's stay to be reviewed and followed by case manager.

## 2014-02-25 DIAGNOSIS — E118 Type 2 diabetes mellitus with unspecified complications: Secondary | ICD-10-CM

## 2014-02-25 DIAGNOSIS — D61818 Other pancytopenia: Secondary | ICD-10-CM

## 2014-02-25 DIAGNOSIS — E46 Unspecified protein-calorie malnutrition: Secondary | ICD-10-CM

## 2014-02-25 LAB — CBC
HCT: 33 % — ABNORMAL LOW (ref 39.0–52.0)
Hemoglobin: 11.6 g/dL — ABNORMAL LOW (ref 13.0–17.0)
MCH: 32 pg (ref 26.0–34.0)
MCHC: 35.2 g/dL (ref 30.0–36.0)
MCV: 91.2 fL (ref 78.0–100.0)
PLATELETS: 191 10*3/uL (ref 150–400)
RBC: 3.62 MIL/uL — AB (ref 4.22–5.81)
RDW: 13.6 % (ref 11.5–15.5)
WBC: 4.4 10*3/uL (ref 4.0–10.5)

## 2014-02-25 LAB — BASIC METABOLIC PANEL
ANION GAP: 17 — AB (ref 5–15)
BUN: 11 mg/dL (ref 6–23)
CO2: 20 mEq/L (ref 19–32)
Calcium: 8.1 mg/dL — ABNORMAL LOW (ref 8.4–10.5)
Chloride: 98 mEq/L (ref 96–112)
Creatinine, Ser: 0.76 mg/dL (ref 0.50–1.35)
Glucose, Bld: 60 mg/dL — ABNORMAL LOW (ref 70–99)
POTASSIUM: 4.1 meq/L (ref 3.7–5.3)
SODIUM: 135 meq/L — AB (ref 137–147)

## 2014-02-25 LAB — GLUCOSE, CAPILLARY
GLUCOSE-CAPILLARY: 75 mg/dL (ref 70–99)
GLUCOSE-CAPILLARY: 96 mg/dL (ref 70–99)
Glucose-Capillary: 61 mg/dL — ABNORMAL LOW (ref 70–99)
Glucose-Capillary: 69 mg/dL — ABNORMAL LOW (ref 70–99)
Glucose-Capillary: 93 mg/dL (ref 70–99)
Glucose-Capillary: 111 mg/dL — ABNORMAL HIGH (ref 70–99)

## 2014-02-25 LAB — OSMOLALITY, URINE: OSMOLALITY UR: 404 mosm/kg (ref 390–1090)

## 2014-02-25 LAB — SODIUM, URINE, RANDOM: SODIUM UR: 99 meq/L

## 2014-02-25 MED ORDER — FUROSEMIDE 10 MG/ML IJ SOLN
40.0000 mg | Freq: Every day | INTRAMUSCULAR | Status: DC
  Filled 2014-02-25: qty 4

## 2014-02-25 MED ORDER — PANTOPRAZOLE SODIUM 40 MG PO TBEC
40.0000 mg | DELAYED_RELEASE_TABLET | Freq: Two times a day (BID) | ORAL | Status: DC
  Administered 2014-02-25 – 2014-02-27 (×4): 40 mg via ORAL
  Filled 2014-02-25 (×5): qty 1

## 2014-02-25 MED ORDER — INSULIN GLARGINE 100 UNIT/ML ~~LOC~~ SOLN
5.0000 [IU] | Freq: Every day | SUBCUTANEOUS | Status: DC
  Administered 2014-02-25 – 2014-02-26 (×2): 5 [IU] via SUBCUTANEOUS
  Filled 2014-02-25 (×2): qty 0.05

## 2014-02-25 NOTE — Progress Notes (Signed)
Clinical Social Work Department BRIEF PSYCHOSOCIAL ASSESSMENT 02/25/2014  Patient:  [name], [name]     Account Number:  [id number]     Admit date:  02/20/2014  Clinical Social Worker:  Hampton Abbot, CLINICAL SOCIAL WORKER  Date/Time:  02/25/2014 01:57 PM  Referred by:  Physician  Date Referred:  02/25/2014 Referred for  SNF Placement   Other Referral:   Substance abuse SNF placement   Interview type:  Patient Other interview type:   And patient's mother via phone    PSYCHOSOCIAL DATA Living Status:  OTHER Admitted from facility:   Level of care:   Primary support name:  Shubham Kniffin Primary support relationship to patient:  PARENT Degree of support available:   Adequate    CURRENT CONCERNS Current Concerns  Post-Acute Placement Substance Abuse   Other Concerns:   Substance and alcohol abuse    SOCIAL WORK ASSESSMENT / PLAN CSW gave patient a list of SNF that he could possibly choose from, CSW faxed out FL2 to [facility] for bed availability.   Assessment/plan status:  Psychosocial Support/Ongoing Assessment of Needs Other assessment/ plan:   Information/referral to community resources:   After SNF, continuing help with alcohol abuse    PATIENT'S/FAMILY'S RESPONSE TO PLAN OF CARE: CSW spoke with patient regarding PT order to be placed in a SNF for short term rehab. CSW introduced herself and role to patient and explained her purpose. CSW talked to patient about living situation prior to being admitted to the hospital. Patient reports that he lived at home with a roommate. CSW told patient that PT recommends SNF so that he can gain his strength back prior to returning home. Patient agreeable to utilize this help through the SNF. Patient also admits to needing help with alcohol abuse and plans to utilize resources after being DC from SNF. CSW also spoke with patient's mother regarding plans after DC and what PT recommends. CSW told mother that we provided a  list of facilities in the [facility] area and the help that the facility could be to patient. Mother agreeable to SNF placement and her first choice was [facility]. CSW assured the patient's mother that she would contact [facility] to see if they had bed availability and will contact mother tomorrow with further information. CSW will continue to follow patient.        Hampton Abbot BSW Intern   CSW supervisor in room during assessment and allowed intern to discuss SNF options and supervisor discussed substance abuse in further detail. Patient reports he started drinking alcohol and using drugs in his 30s due to trauma. Patient reports he went to [facility] for detox and followed up at [facility]where he received residential treatment for about 3 months. Patient was sober but reports he started drinking again about 7 years ago when his wife passed away. Patient agreeable to complete SBIRT and reports he was trying to reduce his drinking habits prior to admission in order to improve his health. Patient reports he wants to stay sober at DC but agrees that SNF is top priority and then he will follow up on SA outpatient basis when he is stronger. CSW will follow up with outpatient resources for follow up after SNF.  Note reviewed by Unk Lightning, LCSW [phone]

## 2014-02-25 NOTE — Progress Notes (Signed)
Progress Note   [name]:[id number] DOB: 1955-07-05 DOA: 02/20/2014 PCP: PROVIDER NOT IN SYSTEM   Brief Narrative:   [name] is an 58 y.o. male with a PMH of CAD, history of MI, cardiomyopathy, alcohol abuse, type 2 diabetes, hypertension, depression and hyperlipidemia who was admitted on 02/20/14 with a chief complaint of a 3 month history of right knee pain following a fall. He was admitted with alcoholic ketoacidosis, alcoholic gastritis, elevated LFTs and an anion gap of 21. During the course of his hospital stay, the patient required transfer to the SDU secondary to severe dyspnea requiring a 50% Ventimask. Chest x-ray showed findings concerning for pneumonia.  Assessment/Plan:   Principal Problem:   Alcohol dependence with withdrawal delirium / alcohol abuse with intoxication  Patient was fully detoxed with Ativan per CIWA protocol.  Currently being managed with Valium 5 mg every 8 hours.  Continue folic acid, thiamine and multivitamin.  Active Problems:   Type II DM / hypoglycemia  Hemoglobin A1c 7.3%. CBG 61-93  Decrease Lantus/change SSI2 sensitive scale given hypoglycemic episodes.    Acute alcoholic gastritis / mild pancreatitis  Continue PPI therapy.  Pancreatitis resolved with abstinence.    Alcoholic ketoacidosis  Resolved with IV fluids and diet advancement.    Acute respiratory failure with hypoxia / aspiration pneumonia  Multifactorial with aspiration pneumonia, acute on chronic systolic heart failure in the setting of altered mental status all contributory.  Respiratory status now stable with diuresis and empiric antibiotics.    Systolic and diastolic CHF, acute on chronic / congestive dilated cardiomyopathy  Been followed by cardiology.  Diurese as tolerated.  2-D echo shows significant systolic dysfunction with an EF of 25% and diffuse hypokinesis.  Continue lisinopril and Coreg.    Transaminitis / Alcoholic  hepatitis  Likely from alcoholic hepatitis or hepatic congestion from heart failure.  Improved with diuresis and abstinence from alcohol.    Protein-calorie malnutrition  Continue Glucerna shakes.  Seen/evaluated by dietitian.    Other pancytopenia  Likely from toxic effects of alcohol on bone marrow. Counts stable/improving.    Hypokalemia / Hypomagnesemia  Corrected with supplementation.    HCV antibody positive  Transaminitis improving.    Hyponatremia  Mild. Likely from CHF physiology.    Essential hypertension  Controlled on Coreg.    DVT Prophylaxis  Code Status: Full. Family Communication: No family currently at the bedside. Disposition Plan: Home when stable.   IV Access:    Peripheral IV   Procedures and diagnostic studies:   US Abdomen Complete 02/21/2014: Hepatic steatosis without acute intra-abdominal abnormality.     Dg Chest Port 1 View 02/20/2014: 1. New bilateral lower lobe airspace disease, worse on the right, is concerning for pneumonia, particularly on the right. 2. New diffuse interstitial pattern compatible with edema.   Electronically Signed   By: Gennette Pac M.D.   On: 02/20/2014 17:36   Dg Knee Complete 4 Views Right 02/20/2014: No acute abnormality noted.     Medical Consultants:    Dr. Marca Ancona, Cardiology.  Anti-Infectives:    Zosyn 02/20/14 --->02/24/14  Levaquin 02/24/14--->  Subjective:   [name] has had some nausea but no vomiting. Denies frank dyspnea, says his breathing is "so-so", no diarrhea.  Poor appetite.  Objective:    Filed Vitals:   02/25/14 0509 02/25/14 0734 02/25/14 0958 02/25/14 1416  BP: 111/80 113/83 110/76 118/80  Pulse: 91 84 83 85  Temp: 97.4 F (36.3 C)  97.5 F (36.4 C) 97.5 F (36.4 C) 98 F (36.7 C)  TempSrc: Oral Oral Oral Oral  Resp: 20 18 18 20   Height:      Weight:      SpO2: 93% 98% 100% 100%    Intake/Output Summary (Last 24 hours) at 02/25/14 1652 Last  data filed at 02/25/14 1601  Gross per 24 hour  Intake    370 ml  Output   1950 ml  Net  -1580 ml    Exam: Gen:  NAD Cardiovascular:  RRR, No M/R/G Respiratory:  Lungs CTAB Gastrointestinal:  Abdomen soft, NT/ND, + BS Extremities:  No C/E/C   Data Reviewed:    Labs: Basic Metabolic Panel:  Recent Labs Lab 02/20/14 1228 02/21/14 0150 02/22/14 0340 02/23/14 0350 02/23/14 0825 02/24/14 0500 02/25/14 0455  NA 140 134* 132* 128*  --  131* 135*  K 3.4* 3.0* 3.4* 3.7  --  3.8 4.1  CL 98 96 94* 92*  --  95* 98  CO2 21 23 23 22   --  22 20  GLUCOSE 192* 243* 135* 153*  --  123* 60*  BUN 7 5* 6 6  --  9 11  CREATININE 0.60 0.58 0.67 0.67  --  0.72 0.76  CALCIUM 7.8* 7.2* 8.0* 8.1*  --  8.1* 8.1*  MG 1.3* 1.4* 1.6 1.6  --   --   --   PHOS  --   --   --   --  3.8  --   --    GFR Estimated Creatinine Clearance: 108.6 mL/min (by C-G formula based on Cr of 0.76). Liver Function Tests:  Recent Labs Lab 02/20/14 1228 02/21/14 0150 02/22/14 1253 02/23/14 0350 02/24/14 0500  AST 285* 198* 138* 93* 58*  ALT 120* 97* 82* 66* 50  ALKPHOS 98 87 89 85 81  BILITOT 2.0* 2.2* 2.8* 3.2* 3.0*  PROT 7.3 6.4 6.7 6.4 6.3  ALBUMIN 2.8* 2.4* 2.3* 2.2* 2.1*    Recent Labs Lab 02/20/14 1228 02/23/14 0800  LIPASE 103* 13   Coagulation profile  Recent Labs Lab 02/20/14 1228  INR 1.15    CBC:  Recent Labs Lab 02/20/14 1228 02/21/14 0150 02/22/14 0340 02/25/14 0455  WBC 2.8* 3.2* 3.7* 4.4  NEUTROABS 1.6*  --   --   --   HGB 12.5* 11.4* 12.6* 11.6*  HCT 34.7* 33.8* 36.3* 33.0*  MCV 89.9 90.6 91.9 91.2  PLT 158 139* 146* 191   Cardiac Enzymes:  Recent Labs Lab 02/20/14 1408 02/20/14 1941 02/21/14 0150  TROPONINI <0.30 <0.30 <0.30   BNP (last 3 results)  Recent Labs  02/20/14 1939 02/24/14 0340  PROBNP 5707.0* 5308.0*   CBG:  Recent Labs Lab 02/24/14 2152 02/25/14 0714 02/25/14 0755 02/25/14 0824 02/25/14 1120  GLUCAP 83 61* 69* 75 93     Microbiology Recent Results (from the past 240 hour(s))  MRSA PCR Screening     Status: None   Collection Time: 02/20/14  6:48 PM  Result Value Ref Range Status   MRSA by PCR NEGATIVE NEGATIVE Final    Comment:        The GeneXpert MRSA Assay (FDA approved for NASAL specimens only), is one component of a comprehensive MRSA colonization surveillance program. It is not intended to diagnose MRSA infection nor to guide or monitor treatment for MRSA infections.     Medications:   . antiseptic oral rinse  7 mL Mouth Rinse q12n4p  . aspirin EC  81 mg  Oral Daily  . buPROPion  150 mg Oral Daily  . carvedilol  3.125 mg Oral BID WC  . chlorhexidine  15 mL Mouth Rinse BID  . diazepam  5 mg Oral 3 times per day  . enoxaparin (LOVENOX) injection  40 mg Subcutaneous Q24H  . feeding supplement (GLUCERNA SHAKE)  237 mL Oral TID BM  . folic acid  1 mg Oral Daily  . furosemide  40 mg Intravenous Daily  . insulin aspart  0-9 Units Subcutaneous TID WC  . insulin glargine  5 Units Subcutaneous QHS  . levofloxacin  750 mg Oral Daily  . lisinopril  5 mg Oral Daily  . multivitamin with minerals  1 tablet Oral Daily  . pantoprazole (PROTONIX) IV  40 mg Intravenous Q12H  . sertraline  100 mg Oral QHS  . sodium chloride  3 mL Intravenous Q12H  . thiamine  100 mg Oral Daily   Or  . thiamine  100 mg Intravenous Daily  . traZODone  100 mg Oral QHS   Continuous Infusions: . sodium chloride 10 mL/hr at 02/24/14 1628    Time spent: 25 minutes.   LOS: 5 days   Atticus Wedin  [facility] Pager [phone]. If unable to reach me by pager, please call my cell phone at [phone].  *Please refer to [email or web address], password [email or web address] to get updated schedule on who will round on this patient, as hospitalists switch teams weekly. If 7PM-7AM, please contact night-coverage at [email or web address], password [email or web address] for any overnight needs.  02/25/2014, 4:52 PM

## 2014-02-25 NOTE — Evaluation (Signed)
Physical Therapy Evaluation Patient Details Name: [name] MRN: [id number] DOB: 1956/03/12 Today's Date: 02/25/2014   History of Present Illness  58 y/o male with hx of CAD, Etoh dependence, DM2, HTN, depression, HLD, presents with one-month history of worsening right knee pain. The patient states that he had a mechanical fall approximately 3 months ago injuring his right knee. Since that period of time he has had worsening knee pain, particularly in the past 1 month.Dx of alcoholic ketoacidosis, CHF, PNA.   Clinical Impression  *Pt admitted with alcoholic ketoacidosis, CHF, PNA*. Pt currently with functional limitations due to the deficits listed below (see PT Problem List).  Pt will benefit from skilled PT to increase their independence and safety with mobility to allow discharge to the venue listed below.   With encouragement, pt agreed to bed to recliner transfer. He is lethargic and has slurred speech, but is oriented. He is requiring assist of 2 to transfer. He has poor sitting balance and is deconditioned. At present he will need ST-SNF level of care.     **    Follow Up Recommendations SNF (assistance for mobility)    Equipment Recommendations  Wheelchair (measurements PT)    Recommendations for Other Services       Precautions / Restrictions Precautions Precautions: Fall Restrictions Weight Bearing Restrictions: No      Mobility  Bed Mobility Overal bed mobility: +2 for physical assistance;Needs Assistance Bed Mobility: Supine to Sit     Supine to sit: +2 for physical assistance;Max assist     General bed mobility comments: pt 40%, assist to raise trunk and advance BLEs  Transfers Overall transfer level: Needs assistance Equipment used: None Transfers: Stand Pivot Transfers;Sit to/from Stand Sit to Stand: +2 physical assistance;+2 safety/equipment;Max assist Stand pivot transfers: +2 physical assistance;+2 safety/equipment;Max assist;From elevated  surface       General transfer comment: pt 50%, assist to rise and to pivot  Ambulation/Gait                Stairs            Wheelchair Mobility    Modified Rankin (Stroke Patients Only)       Balance Overall balance assessment: Needs assistance Sitting-balance support: Bilateral upper extremity supported;Feet supported Sitting balance-Leahy Scale: Poor Sitting balance - Comments: pt sat on EOB x 6 minutes Postural control: Posterior lean                                   Pertinent Vitals/Pain Pain Assessment: 0-10 Pain Score: 8  Pain Descriptors / Indicators: Sore Pain Intervention(s): Limited activity within patient's tolerance;Monitored during session    Home Living Family/patient expects to be discharged to:: Private residence Living Arrangements: Other (Comment) (room mate)     Home Access: Stairs to enter   Entrance Stairs-Number of Steps: 6   Home Equipment: Walker - 2 wheels;Cane - single point      Prior Function Level of Independence: Independent               Hand Dominance        Extremity/Trunk Assessment   Upper Extremity Assessment: Generalized weakness (B shoulder elevation 80* AROM)           Lower Extremity Assessment: RLE deficits/detail;LLE deficits/detail RLE Deficits / Details: knee extension AROM -25* in sitting LLE Deficits / Details: L knee extension AROM -15*, strength +3/5  Cervical / Trunk  Assessment: Normal  Communication   Communication: Expressive difficulties (slurred speech)  Cognition Arousal/Alertness: Lethargic Behavior During Therapy: WFL for tasks assessed/performed Overall Cognitive Status: Within Functional Limits for tasks assessed                      General Comments      Exercises        Assessment/Plan    PT Assessment Patient needs continued PT services  PT Diagnosis Difficulty walking;Generalized weakness;Acute pain   PT Problem List Decreased  strength;Decreased range of motion;Decreased activity tolerance;Decreased balance;Pain;Decreased knowledge of use of DME;Decreased mobility  PT Treatment Interventions DME instruction;Gait training;Functional mobility training;Therapeutic activities;Patient/family education;Balance training   PT Goals (Current goals can be found in the Care Plan section) Acute Rehab PT Goals Patient Stated Goal: to walk PT Goal Formulation: With patient/family Time For Goal Achievement: 03/11/14 Potential to Achieve Goals: Fair    Frequency Min 3X/week   Barriers to discharge        Co-evaluation               End of Session Equipment Utilized During Treatment: Gait belt Activity Tolerance: Patient limited by pain;Patient limited by fatigue;Patient limited by lethargy Patient left: in chair;with call bell/phone within reach;with family/visitor present Nurse Communication: Mobility status         Time: 1135-1155 PT Time Calculation (min): 20 min   Charges:   PT Evaluation $Initial PT Evaluation Tier I: 1 Procedure PT Treatments $Therapeutic Activity: 8-22 mins   PT G Codes:          Tamala Ser 02/25/2014, 12:42 PM [phone]

## 2014-02-25 NOTE — Progress Notes (Addendum)
Clinical Social Work Department CLINICAL SOCIAL WORK PLACEMENT NOTE 02/25/2014  Patient:  [name], [name]  Account Number:  [id number] Admit date:  02/20/2014  Clinical Social Worker:  Hampton Abbot, CLINICAL SOCIAL WORKER  Date/time:  02/25/2014 02:10 PM  Clinical Social Work is seeking post-discharge placement for this patient at the following level of care:   SKILLED NURSING   (*CSW will update this form in Epic as items are completed)   02/25/2014  Patient/family provided with [facility] Department of Clinical Social Work's list of facilities offering this level of care within the geographic area requested by the patient (or if unable, by the patient's family).  02/25/2014  Patient/family informed of their freedom to choose among providers that offer the needed level of care, that participate in Medicare, Medicaid or managed care program needed by the patient, have an available bed and are willing to accept the patient.  02/25/2014  Patient/family informed of [facility]' ownership interest in [facility], as well as of the fact that they are under no obligation to receive care at this facility.  PASARR submitted to [facility] on 02/25/2014 PASARR number received on 02/26/2014  FL2 transmitted to all facilities in geographic area requested by pt/family on  02/25/2014 FL2 transmitted to all facilities within larger geographic area on   Patient informed that his/her managed care company has contracts with or will negotiate with  certain facilities, including the following:     Patient/family informed of bed offers received:  11/4/115 Patient chooses bed at [facility] Physician recommends and patient [facility] bed [facility]    [facility]  Patient to be transferred to facility by [facility] Patient and family notified of transfer on 02/27/14 Name of family member notified:  Mom-Edna  The following physician request were entered in  Epic:   Additional Comments:     Hampton Abbot BSW Intern    02/25/14-Clinicals submitted to [facility] for [employer]

## 2014-02-25 NOTE — Progress Notes (Signed)
Hypoglycemic Event  CBG: 61  Treatment: 15 GM carbohydrate snack  Symptoms: None  Follow-up CBG: Time: 0751 CBG Result: 69  Possible Reasons for Event: Inadequate meal intake  Comments/MD notified:     Justin Mend  Remember to initiate Hypoglycemia Order Set & complete

## 2014-02-25 NOTE — Progress Notes (Signed)
Patient ID: [name], male   DOB: 01-Jan-1956, 59 y.o.   MRN: [id number]    SUBJECTIVE: patient is lethargic. He is not complaining of pain. He's had slight diuresis since yesterday. Renal function appears to be stable.   Filed Vitals:   02/24/14 2155 02/25/14 0500 02/25/14 0509 02/25/14 0734  BP: 96/64  111/80 113/83  Pulse: 92  91 84  Temp: 98.2 F (36.8 C)  97.4 F (36.3 C) 97.5 F (36.4 C)  TempSrc: Oral  Oral Oral  Resp: 20  20 18   Height:      Weight:  170 lb 3.1 oz (77.2 kg)    SpO2: 94%  93% 98%     Intake/Output Summary (Last 24 hours) at 02/25/14 0804 Last data filed at 02/25/14 0618  Gross per 24 hour  Intake    760 ml  Output   1350 ml  Net   -590 ml    LABS: Basic Metabolic Panel:  Recent Labs  24/46/28 0350 02/23/14 0825 02/24/14 0500 02/25/14 0455  NA 128*  --  131* 135*  K 3.7  --  3.8 4.1  CL 92*  --  95* 98  CO2 22  --  22 20  GLUCOSE 153*  --  123* 60*  BUN 6  --  9 11  CREATININE 0.67  --  0.72 0.76  CALCIUM 8.1*  --  8.1* 8.1*  MG 1.6  --   --   --   PHOS  --  3.8  --   --    Liver Function Tests:  Recent Labs  02/23/14 0350 02/24/14 0500  AST 93* 58*  ALT 66* 50  ALKPHOS 85 81  BILITOT 3.2* 3.0*  PROT 6.4 6.3  ALBUMIN 2.2* 2.1*    Recent Labs  02/23/14 0800  LIPASE 13   CBC:  Recent Labs  02/25/14 0455  WBC 4.4  HGB 11.6*  HCT 33.0*  MCV 91.2  PLT 191   Cardiac Enzymes: No results for input(s): CKTOTAL, CKMB, CKMBINDEX, TROPONINI in the last 72 hours. BNP: Invalid input(s): POCBNP D-Dimer: No results for input(s): DDIMER in the last 72 hours. Hemoglobin A1C: No results for input(s): HGBA1C in the last 72 hours. Fasting Lipid Panel: No results for input(s): CHOL, HDL, LDLCALC, TRIG, CHOLHDL, LDLDIRECT in the last 72 hours. Thyroid Function Tests: No results for input(s): TSH, T4TOTAL, T3FREE, THYROIDAB in the last 72 hours.  Invalid input(s): FREET3  RADIOLOGY: US Abdomen Complete  02/21/2014    CLINICAL DATA:  Elevated liver function tests and lipase  EXAM: ULTRASOUND ABDOMEN COMPLETE  COMPARISON:  CT 10/23/2008  FINDINGS: Gallbladder: No gallstones or wall thickening visualized. No sonographic Murphy sign noted.  Common bile duct: Diameter: 3 mm  Liver: Hepatic increased echogenicity likely indicates underlying steatosis without focal abnormality or intrahepatic ductal dilatation.  IVC: No abnormality visualized.  Pancreas: Visualized portion unremarkable.  Spleen: Size and appearance within normal limits.  Right Kidney: Length: 13 cm. Echogenicity within normal limits. No mass or hydronephrosis visualized.  Left Kidney: Length: 12.7 cm. Echogenicity within normal limits. No mass or hydronephrosis visualized.  Abdominal aorta: No aneurysm visualized.  Other findings: None.  IMPRESSION: Hepatic steatosis without acute intra-abdominal abnormality.   Electronically Signed   By: Christiana Pellant M.D.   On: 02/21/2014 08:21   Dg Chest Port 1 View  02/20/2014   CLINICAL DATA:  Progressive shortness of breath and hypoxemia. Earlier presentation on the same day for knee pain.  EXAM: PORTABLE  CHEST - 1 VIEW  COMPARISON:  Two-view chest 06/18/2013.  FINDINGS: Heart size is normal. Right lower lobe pneumonia is noted. Mild to moderate generalized interstitial pattern is new, compatible with edema. Mild left basilar airspace disease could reflect infection is well. Atelectasis is considered. The visualized soft tissues and bony thorax are unremarkable.  IMPRESSION: 1. New bilateral lower lobe airspace disease, worse on the right, is concerning for pneumonia, particularly on the right. 2. New diffuse interstitial pattern compatible with edema.   Electronically Signed   By: Gennette Pachris  Mattern M.D.   On: 02/20/2014 17:36   Dg Knee Complete 4 Views Right  02/20/2014   CLINICAL DATA:  Knee pain  EXAM: RIGHT KNEE - COMPLETE 4+ VIEW  COMPARISON:  12/28/2013  FINDINGS: No acute fracture or dislocation is identified. No  gross soft tissue abnormality is seen. Diffuse vascular calcifications are noted.  IMPRESSION: No acute abnormality noted.   Electronically Signed   By: Alcide CleverMark  Lukens M.D.   On: 02/20/2014 10:44    PHYSICAL EXAM   Patient is lethargic. Head is atraumatic. Lungs reveal scattered rhonchi. Cardiac exam reveals S1 and S2. The abdomen is soft. There is no significant peripheral edema.    ASSESSMENT AND PLAN:    Acute on chronic systolic CHF (congestive heart failure)   Congestive dilated cardiomyopathy     The patient had mild diuresis yesterday. His overall volume status seems stable today. I have cut his Lasix back from twice daily to once daily. Plan to continue to follow his labs and his input and output.   [name] 02/25/2014 8:04 AM

## 2014-02-25 NOTE — Progress Notes (Signed)
Clinical Social Work  CSW went to room in order to complete assessment for substance abuse but patient's RN and a visitor were in the room. CSW will follow up at later time to complete full assessment.  [place], [place] [phone]

## 2014-02-25 NOTE — Progress Notes (Signed)
NUTRITION FOLLOW UP  Intervention:   Regular diet Glucerna tid Encouraged intake RD to follow  Nutrition Dx:   Increased nutrient (protein) needs related to hx of substance and etoh abuse as evidenced by estimated nutritional needs.-continues  Goal:   Patient to meet >/=90% of their estimated nutrition needs.-not yet met  Monitor:   PO and supplemental intake, weight, labs, i/o  Assessment:   Patient lethargic today.  Drinking Glucerna.  Meal intake poor.    Height: Ht Readings from Last 1 Encounters:  02/20/14 6' 3"  (1.905 m)    Weight Status:   Wt Readings from Last 1 Encounters:  02/25/14 170 lb 3.1 oz (77.2 kg)    Re-estimated needs:  Kcal: 2200-2400 Protein: 100-110 gm Fluid: >2.2 L daily  Skin: intact  Diet Order: Diet regular   Intake/Output Summary (Last 24 hours) at 02/25/14 1636 Last data filed at 02/25/14 1601  Gross per 24 hour  Intake    370 ml  Output   1950 ml  Net  -1580 ml     Labs:   Recent Labs Lab 02/21/14 0150 02/22/14 0340 02/23/14 0350 02/23/14 0825 02/24/14 0500 02/25/14 0455  NA 134* 132* 128*  --  131* 135*  K 3.0* 3.4* 3.7  --  3.8 4.1  CL 96 94* 92*  --  95* 98  CO2 23 23 22   --  22 20  BUN 5* 6 6  --  9 11  CREATININE 0.58 0.67 0.67  --  0.72 0.76  CALCIUM 7.2* 8.0* 8.1*  --  8.1* 8.1*  MG 1.4* 1.6 1.6  --   --   --   PHOS  --   --   --  3.8  --   --   GLUCOSE 243* 135* 153*  --  123* 60*    CBG (last 3)   Recent Labs  02/25/14 0755 02/25/14 0824 02/25/14 1120  GLUCAP 69* 75 93    Scheduled Meds: . antiseptic oral rinse  7 mL Mouth Rinse q12n4p  . aspirin EC  81 mg Oral Daily  . buPROPion  150 mg Oral Daily  . carvedilol  3.125 mg Oral BID WC  . chlorhexidine  15 mL Mouth Rinse BID  . diazepam  5 mg Oral 3 times per day  . enoxaparin (LOVENOX) injection  40 mg Subcutaneous Q24H  . feeding supplement (GLUCERNA SHAKE)  237 mL Oral TID BM  . folic acid  1 mg Oral Daily  . furosemide  40 mg  Intravenous Daily  . insulin aspart  0-9 Units Subcutaneous TID WC  . insulin glargine  5 Units Subcutaneous QHS  . levofloxacin  750 mg Oral Daily  . lisinopril  5 mg Oral Daily  . multivitamin with minerals  1 tablet Oral Daily  . pantoprazole (PROTONIX) IV  40 mg Intravenous Q12H  . sertraline  100 mg Oral QHS  . sodium chloride  3 mL Intravenous Q12H  . thiamine  100 mg Oral Daily   Or  . thiamine  100 mg Intravenous Daily  . traZODone  100 mg Oral QHS    Continuous Infusions: . sodium chloride 10 mL/hr at 02/24/14 1628    Antonieta Iba, RD, LDN Clinical Inpatient Dietitian Pager:  [phone] Weekend and after hours pager:  [phone]

## 2014-02-25 NOTE — Progress Notes (Signed)
Hypoglycemic Event  CBG: 69  Treatment: 15 GM carbohydrate snack  Symptoms: None  Follow-up CBG: Time: 0820 CBG Result: 75  Possible Reasons for Event: Inadequate meal intake  Comments/MD notified:     Justin Mend  Remember to initiate Hypoglycemia Order Set & complete

## 2014-02-26 DIAGNOSIS — F10939 Alcohol use, unspecified with withdrawal, unspecified: Secondary | ICD-10-CM | POA: Insufficient documentation

## 2014-02-26 DIAGNOSIS — F10239 Alcohol dependence with withdrawal, unspecified: Secondary | ICD-10-CM | POA: Insufficient documentation

## 2014-02-26 LAB — GLUCOSE, CAPILLARY
GLUCOSE-CAPILLARY: 133 mg/dL — AB (ref 70–99)
GLUCOSE-CAPILLARY: 200 mg/dL — AB (ref 70–99)
Glucose-Capillary: 87 mg/dL (ref 70–99)
Glucose-Capillary: 94 mg/dL (ref 70–99)

## 2014-02-26 MED ORDER — FUROSEMIDE 40 MG PO TABS
40.0000 mg | ORAL_TABLET | Freq: Every day | ORAL | Status: DC
  Administered 2014-02-26 – 2014-02-27 (×2): 40 mg via ORAL
  Filled 2014-02-26 (×2): qty 1

## 2014-02-26 MED ORDER — DRONABINOL 2.5 MG PO CAPS
2.5000 mg | ORAL_CAPSULE | Freq: Two times a day (BID) | ORAL | Status: DC
  Administered 2014-02-26 – 2014-02-27 (×2): 2.5 mg via ORAL
  Filled 2014-02-26 (×2): qty 1

## 2014-02-26 NOTE — Progress Notes (Signed)
Physical Therapy Treatment Patient Details Name: [name] MRN: [id number] DOB: [birth year] Today's Date: 02/26/2014    History of Present Illness 58 y/o male with hx of CAD, Etoh dependence, DM2, HTN, depression, HLD, presents with one-month history of worsening right knee pain. The patient states that he had a mechanical fall approximately 3 months ago injuring his right knee. Since that period of time he has had worsening knee pain, particularly in the past 1 month.Dx of alcoholic ketoacidosis, CHF, PNA.     PT Comments    Patient is min-mod A +2 for all mobility this session.  He requires max encouragement to perform transfers and gait d/t R knee pain and fatigue.  Patient is shaky and unsteady with transfers and gait.  Patient c/o some dizziness upon standing; BP 91/67.    Follow Up Recommendations  SNF     Equipment Recommendations  Wheelchair (measurements PT)    Recommendations for Other Services       Precautions / Restrictions Precautions Precautions: Fall Restrictions Weight Bearing Restrictions: No    Mobility  Bed Mobility Overal bed mobility: Needs Assistance Bed Mobility: Rolling;Sidelying to Sit Rolling: Mod assist Sidelying to sit: Mod assist       General bed mobility comments: VCs for sequencing; HOB elevated; Pt requires heavy use of siderails for rolling; requires significant handheld A for sidelying to sitting  Transfers Overall transfer level: Needs assistance Equipment used: Rolling walker (2 wheeled) Transfers: Sit to/from Stand Sit to Stand: Min assist;+2 physical assistance         General transfer comment: VCs for hand/foot placement and sequencing; pt very unsteady/shaky  Ambulation/Gait Ambulation/Gait assistance: Min assist;+2 physical assistance;+2 safety/equipment Ambulation Distance (Feet): 30 Feet Assistive device: Rolling walker (2 wheeled) Gait Pattern/deviations: Step-through pattern;Decreased stride  length;Shuffle;Antalgic Gait velocity: decreased   General Gait Details: VCs for RW management and sequencing; pt requires max motivation to perform; pt is shaky/unsteady, high fall risk;    Stairs            Wheelchair Mobility    Modified Rankin (Stroke Patients Only)       Balance                                    Cognition Arousal/Alertness: Lethargic Behavior During Therapy: WFL for tasks assessed/performed Overall Cognitive Status: Within Functional Limits for tasks assessed                      Exercises      General Comments        Pertinent Vitals/Pain Pain Assessment: Faces Pain Score: 6  Faces Pain Scale: Hurts even more Pain Location: R knee Pain Intervention(s): Repositioned;Monitored during session;Limited activity within patient's tolerance    Home Living                      Prior Function            PT Goals (current goals can now be found in the care plan section) Acute Rehab PT Goals Patient Stated Goal: to walk Progress towards PT goals: Progressing toward goals    Frequency  Min 3X/week    PT Plan Current plan remains appropriate    Co-evaluation             End of Session Equipment Utilized During Treatment: Gait belt Activity Tolerance: Patient limited by pain;Patient limited by  fatigue;Patient limited by lethargy Patient left: in chair;with call bell/phone within reach;with family/visitor present     Time: 1349-1413 PT Time Calculation (min): 24 min  Charges:                       G Codes:      Shatia Sindoni, SPTA 02/26/2014, 4:09 PM

## 2014-02-26 NOTE — Progress Notes (Addendum)
Clinical Social Work  CSW met with patient at bedside to provide bed offers. Patient reviewed list and reports he is agreeable to placement at [facility] since his mother was wanting to visit him often. Patient asked that CSW contact his mom and keep her updated on plans. CSW called and left a message with mom. CSW alerted [facility] of patient's choice of their facility and will keep SNF updated when patient is ready to DC. CSW is still awaiting [name].  Sindy Messing, [place] [phone]  Addendum [phone] Mom returned CSW phone call and reports she is happy that patient can DC to [facility] at DC. Mom wants to be kept updated on DC plans.   Addendum 1500 [name] received and SNF is agreeable to accept patient when medically stable.

## 2014-02-26 NOTE — Progress Notes (Addendum)
Progress Note   [name] ZOX:[id number] DOB: 1955-09-29 DOA: 02/20/2014 PCP: PROVIDER NOT IN SYSTEM   Brief Narrative:   [name] is an 58 y.o. male with a PMH of CAD, history of MI, cardiomyopathy, alcohol abuse, type 2 diabetes, hypertension, depression and hyperlipidemia who was admitted on 02/20/14 with a chief complaint of a 3 month history of right knee pain following a fall. He was admitted with alcoholic ketoacidosis, alcoholic gastritis, elevated LFTs and an anion gap of 21. During the course of his hospital stay, the patient required transfer to the SDU secondary to severe dyspnea requiring a 50% Ventimask. Chest x-ray showed findings concerning for pneumonia.  Assessment/Plan:   Principal Problem:   Alcohol dependence with withdrawal delirium / alcohol abuse with intoxication  Patient was fully detoxed with Ativan per CIWA protocol.  Currently being managed with Valium 5 mg every 8 hours.  Continue folic acid, thiamine and multivitamin.  Active Problems:   Type II DM / hypoglycemia  Hemoglobin A1c 7.3%. CBG 87-111.  Insulin adjusted 02/25/14 to address hypoglycemic episodes.    Acute alcoholic gastritis / mild pancreatitis  Continue PPI therapy.  Pancreatitis resolved with abstinence.    Alcoholic ketoacidosis  Resolved with IV fluids and diet advancement.    Acute respiratory failure with hypoxia / aspiration pneumonia  Multifactorial with aspiration pneumonia, acute on chronic systolic heart failure in the setting of altered mental status all contributory.  Respiratory status now stable with diuresis and empiric antibiotics.    Systolic and diastolic CHF, acute on chronic / congestive dilated cardiomyopathy  Been followed by cardiology.  Diurese as tolerated.  I/O -780 L/24 hours, but weight up.  Respiratory status stable.  2-D echo shows significant systolic dysfunction with an EF of 25% and diffuse hypokinesis.  Continue  lisinopril and Coreg.    Transaminitis / Alcoholic hepatitis  Likely from alcoholic hepatitis or hepatic congestion from heart failure.  Improved with diuresis and abstinence from alcohol.    Protein-calorie malnutrition  Continue Glucerna shakes.  Seen/evaluated by dietitian.  Add Marinol.    Other pancytopenia  Likely from toxic effects of alcohol on bone marrow. Counts stable/improving.    Hypokalemia / Hypomagnesemia  Corrected with supplementation.    HCV antibody positive  Transaminitis improving.    Hyponatremia  Mild. Likely from CHF physiology.    Essential hypertension  Controlled on Coreg.    DVT Prophylaxis  Continue Lovenox.  Code Status: Full. Family Communication: Mother updated at the bedside. Disposition Plan: Home when stable.   IV Access:    Peripheral IV   Procedures and diagnostic studies:   US Abdomen Complete 02/21/2014: Hepatic steatosis without acute intra-abdominal abnormality.     Dg Chest Port 1 View 02/20/2014: 1. New bilateral lower lobe airspace disease, worse on the right, is concerning for pneumonia, particularly on the right. 2. New diffuse interstitial pattern compatible with edema.   Electronically Signed   By: Gennette Pac M.D.   On: 02/20/2014 17:36   Dg Knee Complete 4 Views Right 02/20/2014: No acute abnormality noted.     Medical Consultants:    Dr. Marca Ancona, Cardiology.  Anti-Infectives:    Zosyn 02/20/14 --->02/24/14  Levaquin 02/24/14--->  Subjective:   [name] denies N/V.  Has occasional dyspnea.  Appetite fair.  Reports some right knee pain.  Objective:    Filed Vitals:   02/25/14 0958 02/25/14 1416 02/25/14 2131 02/26/14 0530  BP: 110/76 118/80 122/86 96/57  Pulse: 83 85 85 73  Temp: 97.5 F (36.4 C) 98 F (36.7 C) 98.4 F (36.9 C) 98.2 F (36.8 C)  TempSrc: Oral Oral Oral Oral  Resp: 18 20 20 18   Height:      Weight:    78.9 kg (173 lb 15.1 oz)  SpO2: 100% 100%  100% 95%    Intake/Output Summary (Last 24 hours) at 02/26/14 1303 Last data filed at 02/26/14 1218  Gross per 24 hour  Intake    120 ml  Output    900 ml  Net   -780 ml    Exam: Gen:  NAD Cardiovascular:  RRR, No M/R/G Respiratory:  Lungs CTAB Gastrointestinal:  Abdomen soft, NT/ND, + BS Extremities:  No C/E/C   Data Reviewed:    Labs: Basic Metabolic Panel:  Recent Labs Lab 02/20/14 1228 02/21/14 0150 02/22/14 0340 02/23/14 0350 02/23/14 0825 02/24/14 0500 02/25/14 0455  NA 140 134* 132* 128*  --  131* 135*  K 3.4* 3.0* 3.4* 3.7  --  3.8 4.1  CL 98 96 94* 92*  --  95* 98  CO2 21 23 23 22   --  22 20  GLUCOSE 192* 243* 135* 153*  --  123* 60*  BUN 7 5* 6 6  --  9 11  CREATININE 0.60 0.58 0.67 0.67  --  0.72 0.76  CALCIUM 7.8* 7.2* 8.0* 8.1*  --  8.1* 8.1*  MG 1.3* 1.4* 1.6 1.6  --   --   --   PHOS  --   --   --   --  3.8  --   --    GFR Estimated Creatinine Clearance: 111 mL/min (by C-G formula based on Cr of 0.76). Liver Function Tests:  Recent Labs Lab 02/20/14 1228 02/21/14 0150 02/22/14 1253 02/23/14 0350 02/24/14 0500  AST 285* 198* 138* 93* 58*  ALT 120* 97* 82* 66* 50  ALKPHOS 98 87 89 85 81  BILITOT 2.0* 2.2* 2.8* 3.2* 3.0*  PROT 7.3 6.4 6.7 6.4 6.3  ALBUMIN 2.8* 2.4* 2.3* 2.2* 2.1*    Recent Labs Lab 02/20/14 1228 02/23/14 0800  LIPASE 103* 13   Coagulation profile  Recent Labs Lab 02/20/14 1228  INR 1.15    CBC:  Recent Labs Lab 02/20/14 1228 02/21/14 0150 02/22/14 0340 02/25/14 0455  WBC 2.8* 3.2* 3.7* 4.4  NEUTROABS 1.6*  --   --   --   HGB 12.5* 11.4* 12.6* 11.6*  HCT 34.7* 33.8* 36.3* 33.0*  MCV 89.9 90.6 91.9 91.2  PLT 158 139* 146* 191   Cardiac Enzymes:  Recent Labs Lab 02/20/14 1408 02/20/14 1941 02/21/14 0150  TROPONINI <0.30 <0.30 <0.30   BNP (last 3 results)  Recent Labs  02/20/14 1939 02/24/14 0340  PROBNP 5707.0* 5308.0*   CBG:  Recent Labs Lab 02/25/14 1120 02/25/14 1649  02/25/14 2150 02/26/14 0729 02/26/14 1135  GLUCAP 93 96 111* 87 94    Microbiology Recent Results (from the past 240 hour(s))  MRSA PCR Screening     Status: None   Collection Time: 02/20/14  6:48 PM  Result Value Ref Range Status   MRSA by PCR NEGATIVE NEGATIVE Final    Comment:        The GeneXpert MRSA Assay (FDA approved for NASAL specimens only), is one component of a comprehensive MRSA colonization surveillance program. It is not intended to diagnose MRSA infection nor to guide or monitor treatment for MRSA infections.     Medications:   .  antiseptic oral rinse  7 mL Mouth Rinse q12n4p  . aspirin EC  81 mg Oral Daily  . buPROPion  150 mg Oral Daily  . carvedilol  3.125 mg Oral BID WC  . chlorhexidine  15 mL Mouth Rinse BID  . diazepam  5 mg Oral 3 times per day  . enoxaparin (LOVENOX) injection  40 mg Subcutaneous Q24H  . feeding supplement (GLUCERNA SHAKE)  237 mL Oral TID BM  . folic acid  1 mg Oral Daily  . furosemide  40 mg Oral Daily  . insulin aspart  0-9 Units Subcutaneous TID WC  . insulin glargine  5 Units Subcutaneous QHS  . levofloxacin  750 mg Oral Daily  . lisinopril  5 mg Oral Daily  . multivitamin with minerals  1 tablet Oral Daily  . pantoprazole  40 mg Oral BID  . sertraline  100 mg Oral QHS  . sodium chloride  3 mL Intravenous Q12H  . thiamine  100 mg Oral Daily   Or  . thiamine  100 mg Intravenous Daily  . traZODone  100 mg Oral QHS   Continuous Infusions: . sodium chloride 10 mL/hr at 02/24/14 1628    Time spent: 25 minutes.   LOS: 6 days   RAMA,CHRISTINA  [facility] Pager [phone]. If unable to reach me by pager, please call my cell phone at [phone].  *Please refer to [email or web address], password [email or web address] to get updated schedule on who will round on this patient, as hospitalists switch teams weekly. If 7PM-7AM, please contact night-coverage at [email or web address], password [email or web address] for any overnight needs.  02/26/2014, 1:03 PM

## 2014-02-26 NOTE — Progress Notes (Signed)
Patient ID: [name], male   DOB: Aug 28, 1955, 58 y.o.   MRN: [id number]    SUBJECTIVE: patient is seen to follow-up congestive heart failure. I cut his diuretic dose pack yesterday. He still diuresis 1200 cc. There are no labs available yet today. He is comfortable lying flat in bed.   Filed Vitals:   02/25/14 0958 02/25/14 1416 02/25/14 2131 02/26/14 0530  BP: 110/76 118/80 122/86 96/57  Pulse: 83 85 85 73  Temp: 97.5 F (36.4 C) 98 F (36.7 C) 98.4 F (36.9 C) 98.2 F (36.8 C)  TempSrc: Oral Oral Oral Oral  Resp: 18 20 20 18   Height:      Weight:    173 lb 15.1 oz (78.9 kg)  SpO2: 100% 100% 100% 95%     Intake/Output Summary (Last 24 hours) at 02/26/14 0628 Last data filed at 02/26/14 0556  Gross per 24 hour  Intake    370 ml  Output   1650 ml  Net  -1280 ml    LABS: Basic Metabolic Panel:  Recent Labs  40/98/1109/05/09 0825 02/24/14 0500 02/25/14 0455  NA  --  131* 135*  K  --  3.8 4.1  CL  --  95* 98  CO2  --  22 20  GLUCOSE  --  123* 60*  BUN  --  9 11  CREATININE  --  0.72 0.76  CALCIUM  --  8.1* 8.1*  PHOS 3.8  --   --    Liver Function Tests:  Recent Labs  02/24/14 0500  AST 58*  ALT 50  ALKPHOS 81  BILITOT 3.0*  PROT 6.3  ALBUMIN 2.1*    Recent Labs  02/23/14 0800  LIPASE 13   CBC:  Recent Labs  02/25/14 0455  WBC 4.4  HGB 11.6*  HCT 33.0*  MCV 91.2  PLT 191   Cardiac Enzymes: No results for input(s): CKTOTAL, CKMB, CKMBINDEX, TROPONINI in the last 72 hours. BNP: Invalid input(s): POCBNP D-Dimer: No results for input(s): DDIMER in the last 72 hours. Hemoglobin A1C: No results for input(s): HGBA1C in the last 72 hours. Fasting Lipid Panel: No results for input(s): CHOL, HDL, LDLCALC, TRIG, CHOLHDL, LDLDIRECT in the last 72 hours. Thyroid Function Tests: No results for input(s): TSH, T4TOTAL, T3FREE, THYROIDAB in the last 72 hours.  Invalid input(s): FREET3  RADIOLOGY: [place] Abdomen Complete  02/21/2014   CLINICAL  DATA:  Elevated liver function tests and lipase  EXAM: ULTRASOUND ABDOMEN COMPLETE  COMPARISON:  CT 10/23/2008  FINDINGS: Gallbladder: No gallstones or wall thickening visualized. No sonographic Murphy sign noted.  Common bile duct: Diameter: 3 mm  Liver: Hepatic increased echogenicity likely indicates underlying steatosis without focal abnormality or intrahepatic ductal dilatation.  IVC: No abnormality visualized.  Pancreas: Visualized portion unremarkable.  Spleen: Size and appearance within normal limits.  Right Kidney: Length: 13 cm. Echogenicity within normal limits. No mass or hydronephrosis visualized.  Left Kidney: Length: 12.7 cm. Echogenicity within normal limits. No mass or hydronephrosis visualized.  Abdominal aorta: No aneurysm visualized.  Other findings: None.  IMPRESSION: Hepatic steatosis without acute intra-abdominal abnormality.   Electronically Signed   By: Christiana PellantGretchen  Green M.D.   On: 02/21/2014 08:21   Dg Chest Port 1 View  02/20/2014   CLINICAL DATA:  Progressive shortness of breath and hypoxemia. Earlier presentation on the same day for knee pain.  EXAM: PORTABLE CHEST - 1 VIEW  COMPARISON:  Two-view chest 06/18/2013.  FINDINGS: Heart size is normal. Right lower  lobe pneumonia is noted. Mild to moderate generalized interstitial pattern is new, compatible with edema. Mild left basilar airspace disease could reflect infection is well. Atelectasis is considered. The visualized soft tissues and bony thorax are unremarkable.  IMPRESSION: 1. New bilateral lower lobe airspace disease, worse on the right, is concerning for pneumonia, particularly on the right. 2. New diffuse interstitial pattern compatible with edema.   Electronically Signed   By: Gennette Pac M.D.   On: 02/20/2014 17:36   Dg Knee Complete 4 Views Right  02/20/2014   CLINICAL DATA:  Knee pain  EXAM: RIGHT KNEE - COMPLETE 4+ VIEW  COMPARISON:  12/28/2013  FINDINGS: No acute fracture or dislocation is identified. No gross soft  tissue abnormality is seen. Diffuse vascular calcifications are noted.  IMPRESSION: No acute abnormality noted.   Electronically Signed   By: Alcide Clever M.D.   On: 02/20/2014 10:44    PHYSICAL EXAM   The patient is comfortable lying flat in bed. He does respond to his name. Cardiac exam reveals S1 and S2. Lungs reveal a few scattered rhonchi. The abdomen is soft. There is no significant peripheral edema.    ASSESSMENT AND PLAN:     Alcohol dependence with withdrawal delirium   Alcoholic ketoacidosis   Acute respiratory failure    Acute on chronic systolic CHF (congestive heart failure)   Congestive dilated cardiomyopathy      Patient is lying flat in bed. He diuresed with only a single dose of IV diuretics yesterday. I will change his diuretics to oral.     [name] 02/26/2014 6:28 AM

## 2014-02-27 DIAGNOSIS — I420 Dilated cardiomyopathy: Secondary | ICD-10-CM

## 2014-02-27 LAB — BASIC METABOLIC PANEL
ANION GAP: 14 (ref 5–15)
BUN: 11 mg/dL (ref 6–23)
CALCIUM: 8.2 mg/dL — AB (ref 8.4–10.5)
CO2: 21 mEq/L (ref 19–32)
Chloride: 101 mEq/L (ref 96–112)
Creatinine, Ser: 0.87 mg/dL (ref 0.50–1.35)
GFR calc non Af Amer: >90 mL/min (ref >90)
Glucose, Bld: 112 mg/dL — ABNORMAL HIGH (ref 70–99)
Potassium: 3.7 mEq/L (ref 3.7–5.3)
SODIUM: 136 meq/L — AB (ref 137–147)

## 2014-02-27 LAB — GLUCOSE, CAPILLARY
Glucose-Capillary: 104 mg/dL — ABNORMAL HIGH (ref 70–99)
Glucose-Capillary: 143 mg/dL — ABNORMAL HIGH (ref 70–99)

## 2014-02-27 MED ORDER — DIAZEPAM 5 MG PO TABS: 5.0000 mg | ORAL_TABLET | Freq: Three times a day (TID) | ORAL | Status: DC

## 2014-02-27 MED ORDER — THIAMINE HCL 100 MG PO TABS: 100.0000 mg | ORAL_TABLET | Freq: Every day | ORAL | Status: DC

## 2014-02-27 MED ORDER — FUROSEMIDE 10 MG/ML IJ SOLN
40.0000 mg | Freq: Once | INTRAMUSCULAR | Status: AC
  Administered 2014-02-27: 40 mg via INTRAVENOUS
  Filled 2014-02-27: qty 4

## 2014-02-27 MED ORDER — GLUCERNA SHAKE PO LIQD: 237.0000 mL | Freq: Three times a day (TID) | ORAL | Status: DC

## 2014-02-27 MED ORDER — FUROSEMIDE 40 MG PO TABS: 40.0000 mg | ORAL_TABLET | Freq: Every day | ORAL | Status: DC

## 2014-02-27 MED ORDER — FOLIC ACID 1 MG PO TABS: 1.0000 mg | ORAL_TABLET | Freq: Every day | ORAL | Status: DC

## 2014-02-27 MED ORDER — DRONABINOL 2.5 MG PO CAPS: 2.5000 mg | ORAL_CAPSULE | Freq: Two times a day (BID) | ORAL | Status: DC

## 2014-02-27 MED ORDER — HYDROCODONE-ACETAMINOPHEN 5-325 MG PO TABS: ORAL_TABLET | ORAL | Status: DC

## 2014-02-27 MED ORDER — CARVEDILOL 6.25 MG PO TABS
6.2500 mg | ORAL_TABLET | Freq: Two times a day (BID) | ORAL | Status: DC
  Filled 2014-02-27 (×2): qty 1

## 2014-02-27 MED ORDER — CARVEDILOL 6.25 MG PO TABS: 6.2500 mg | ORAL_TABLET | Freq: Two times a day (BID) | ORAL | Status: DC

## 2014-02-27 MED ORDER — ADULT MULTIVITAMIN W/MINERALS CH: 1.0000 | ORAL_TABLET | Freq: Every day | ORAL | Status: DC

## 2014-02-27 MED ORDER — INSULIN GLARGINE 100 UNIT/ML ~~LOC~~ SOLN: 5.0000 [IU] | Freq: Every day | SUBCUTANEOUS | Status: DC

## 2014-02-27 MED ORDER — POTASSIUM CHLORIDE ER 20 MEQ PO TBCR: 20.0000 meq | EXTENDED_RELEASE_TABLET | Freq: Every day | ORAL | Status: DC

## 2014-02-27 NOTE — Discharge Summary (Signed)
Physician Discharge Summary  [name] OZD:[id number] DOB: 07/10/1955 DOA: 02/20/2014  PCP: PROVIDER NOT IN SYSTEM  Admit date: 02/20/2014 Discharge date: 02/27/2014   Recommendations for Outpatient Follow-Up:    Weigh patient daily and notify MD for a 3 lb weight gain so that diuretic therapy can be adjusted.   Discharge Diagnosis:   Principal Problem:    Alcohol dependence with withdrawal delirium Active Problems:    Alcoholic ketoacidosis    Acute respiratory failure    Systolic and diastolic CHF, acute on chronic    Transaminitis    Protein-calorie malnutrition    Other pancytopenia    Hypokalemia    Hypomagnesemia    HCV antibody positive    Hyponatremia    Acute respiratory failure with hypoxia    Alcohol abuse with intoxication    Essential hypertension    Acute on chronic systolic CHF (congestive heart failure)    Congestive dilated cardiomyopathy    Type 2 diabetes mellitus with complication    Alcohol withdrawal   Discharge Condition: Improved.  Diet recommendation: Low sodium, heart healthy.  Carbohydrate-modified.     History of Present Illness:   Thomas CancerJerry W [name] is an 58 y.o. male with a PMH of CAD, history of MI, cardiomyopathy, alcohol abuse, type 2 diabetes, hypertension, depression and hyperlipidemia who was admitted on 02/20/14 with a chief complaint of a 3 month history of right knee pain following a fall. He was admitted with alcoholic ketoacidosis, alcoholic gastritis, elevated LFTs and an anion gap of 21. During the course of his hospital stay, the patient required transfer to the SDU secondary to severe dyspnea requiring a 50% Ventimask. Chest x-ray showed findings concerning for pneumonia.  Hospital Course by Problem:   Principal Problem:  Alcohol dependence with withdrawal delirium / alcohol abuse with intoxication  Patient was fully detoxed with Ativan per CIWA protocol.  Currently being managed with Valium  5 mg every 8 hours, which can be slowly weaned.  Continue folic acid, thiamine and multivitamin.  Active Problems:  Type II DM / hypoglycemia  Hemoglobin A1c 7.3%. CBG 87-111.  Resume Metformin at D/C.  Lantus dose decreased given hypoglycemic episodes, poor oral intake.   Acute alcoholic gastritis / mild pancreatitis  Treated with PPI therapy.  Pancreatitis resolved with abstinence.   Alcoholic ketoacidosis  Resolved with IV fluids and diet advancement.   Acute respiratory failure with hypoxia / aspiration pneumonia  Multifactorial with aspiration pneumonia, acute on chronic systolic heart failure in the setting of altered mental status all contributory.  Respiratory status now stable with diuresis and empiric antibiotics.   Systolic and diastolic CHF, acute on chronic / congestive dilated cardiomyopathy  Followed by cardiology.  Diuresed as tolerated.Respiratory status stable at D/C.  2-D echo shows significant systolic dysfunction with an EF of 25% and diffuse hypokinesis.  Continue lisinopril and Coreg.   Transaminitis / Alcoholic hepatitis  Likely from alcoholic hepatitis or hepatic congestion from heart failure.  Improved with diuresis and abstinence from alcohol.   Protein-calorie malnutrition  Continue Glucerna shakes.  Seen/evaluated by dietitian.  Added Marinol.   Other pancytopenia  Likely from toxic effects of alcohol on bone marrow. Counts stable/improving.   Hypokalemia / Hypomagnesemia  Corrected with supplementation.   HCV antibody positive  Transaminitis improving.   Hyponatremia  Mild. Likely from CHF physiology.   Essential hypertension  Controlled on Coreg.    Medical Consultants:    Dr. Marca Anconaalton McLean, Cardiology.   Discharge Exam:   Ceasar MonsFiled  Vitals:   02/27/14 0730  BP: 114/76  Pulse: 80  Temp: 98.1 F (36.7 C)  Resp: 17   Filed Vitals:   02/27/14 0500 02/27/14 0536 02/27/14 0622 02/27/14 0730    BP:  116/76  114/76  Pulse:  86  80  Temp:  98.3 F (36.8 C)  98.1 F (36.7 C)  TempSrc:  Oral  Oral  Resp:  18  17  Height:      Weight: 78.4 kg (172 lb 13.5 oz)  77.2 kg (170 lb 3.1 oz)   SpO2:  94%  92%    Gen:  NAD Cardiovascular:  RRR, No M/R/G Respiratory: Lungs CTAB Gastrointestinal: Abdomen soft, NT/ND with normal active bowel sounds. Extremities: No C/E/C   The results of significant diagnostics from this hospitalization (including imaging, microbiology, ancillary and laboratory) are listed below for reference.     Procedures and Diagnostic Studies:   US Abdomen Complete 02/21/2014: Hepatic steatosis without acute intra-abdominal abnormality.   Dg Chest Port 1 View 02/20/2014: 1. New bilateral lower lobe airspace disease, worse on the right, is concerning for pneumonia, particularly on the right. 2. New diffuse interstitial pattern compatible with edema. Electronically Signed By: Gennette Pac M.D. On: 02/20/2014 17:36   Dg Knee Complete 4 Views Right 02/20/2014: No acute abnormality noted.    Labs:   Basic Metabolic Panel:  Recent Labs Lab 02/20/14 1228 02/21/14 0150 02/22/14 0340 02/23/14 0350 02/23/14 0825 02/24/14 0500 02/25/14 0455 02/27/14 0520  NA 140 134* 132* 128*  --  131* 135* 136*  K 3.4* 3.0* 3.4* 3.7  --  3.8 4.1 3.7  CL 98 96 94* 92*  --  95* 98 101  CO2 21 23 23 22   --  22 20 21   GLUCOSE 192* 243* 135* 153*  --  123* 60* 112*  BUN 7 5* 6 6  --  9 11 11   CREATININE 0.60 0.58 0.67 0.67  --  0.72 0.76 0.87  CALCIUM 7.8* 7.2* 8.0* 8.1*  --  8.1* 8.1* 8.2*  MG 1.3* 1.4* 1.6 1.6  --   --   --   --   PHOS  --   --   --   --  3.8  --   --   --    GFR Estimated Creatinine Clearance: 99.8 mL/min (by C-G formula based on Cr of 0.87). Liver Function Tests:  Recent Labs Lab 02/20/14 1228 02/21/14 0150 02/22/14 1253 02/23/14 0350 02/24/14 0500  AST 285* 198* 138* 93* 58*  ALT 120* 97* 82* 66* 50  ALKPHOS 98 87 89 85 81   BILITOT 2.0* 2.2* 2.8* 3.2* 3.0*  PROT 7.3 6.4 6.7 6.4 6.3  ALBUMIN 2.8* 2.4* 2.3* 2.2* 2.1*    Recent Labs Lab 02/20/14 1228 02/23/14 0800  LIPASE 103* 13   Coagulation profile  Recent Labs Lab 02/20/14 1228  INR 1.15    CBC:  Recent Labs Lab 02/20/14 1228 02/21/14 0150 02/22/14 0340 02/25/14 0455  WBC 2.8* 3.2* 3.7* 4.4  NEUTROABS 1.6*  --   --   --   HGB 12.5* 11.4* 12.6* 11.6*  HCT 34.7* 33.8* 36.3* 33.0*  MCV 89.9 90.6 91.9 91.2  PLT 158 139* 146* 191   Cardiac Enzymes:  Recent Labs Lab 02/20/14 1408 02/20/14 1941 02/21/14 0150  TROPONINI <0.30 <0.30 <0.30   BNP: Invalid input(s): POCBNP CBG:  Recent Labs Lab 02/26/14 0729 02/26/14 1135 02/26/14 1647 02/26/14 2111 02/27/14 0746  GLUCAP 87 94 133* 200* 104*  Microbiology Recent Results (from the past 240 hour(s))  MRSA PCR Screening     Status: None   Collection Time: 02/20/14  6:48 PM  Result Value Ref Range Status   MRSA by PCR NEGATIVE NEGATIVE Final    Comment:        The GeneXpert MRSA Assay (FDA approved for NASAL specimens only), is one component of a comprehensive MRSA colonization surveillance program. It is not intended to diagnose MRSA infection nor to guide or monitor treatment for MRSA infections.     Discharge Instructions:   Discharge Instructions    (HEART FAILURE PATIENTS) Call MD:  Anytime you have any of the following symptoms: 1) 3 pound weight gain in 24 hours or 5 pounds in 1 week 2) shortness of breath, with or without a dry hacking cough 3) swelling in the hands, feet or stomach 4) if you have to sleep on extra pillows at night in order to breathe.    Complete by:  As directed      Diet - low sodium heart healthy    Complete by:  As directed      Diet Carb Modified    Complete by:  As directed      Discharge instructions    Complete by:  As directed   You were cared for by Dr. Hillery Aldo  (a hospitalist) during your hospital stay. If you have any  questions about your discharge medications or the care you received while you were in the hospital after you are discharged, you can call the unit and ask to speak with the hospitalist on call if the hospitalist that took care of you is not available. Once you are discharged, your primary care physician will handle any further medical issues. Please note that NO REFILLS for any discharge medications will be authorized once you are discharged, as it is imperative that you return to your primary care physician (or establish a relationship with a primary care physician if you do not have one) for your aftercare needs so that they can reassess your need for medications and monitor your lab values.  Any outstanding tests can be reviewed by your PCP at your follow up visit.  It is also important to review any medicine changes with your PCP.  Please bring these d/c instructions with you to your next visit so your physician can review these changes with you.  If you do not have a primary care physician, you can call [phone] for a physician referral.  It is highly recommended that you obtain a PCP for hospital follow up.     Increase activity slowly    Complete by:  As directed      Walk with assistance    Complete by:  As directed      Walker     Complete by:  As directed             Medication List    TAKE these medications        aspirin 81 MG tablet  Take 81 mg by mouth daily.     buPROPion 150 MG 24 hr tablet  Commonly known as:  WELLBUTRIN XL  Take 150 mg by mouth daily.     carvedilol 6.25 MG tablet  Commonly known as:  COREG  Take 1 tablet (6.25 mg total) by mouth 2 (two) times daily with a meal.     diazepam 5 MG tablet  Commonly known as:  VALIUM  Take 1 tablet (  5 mg total) by mouth every 8 (eight) hours.     dronabinol 2.5 MG capsule  Commonly known as:  MARINOL  Take 1 capsule (2.5 mg total) by mouth 2 (two) times daily before lunch and supper.     feeding supplement  (GLUCERNA SHAKE) Liqd  Take 237 mLs by mouth 3 (three) times daily between meals.     folic acid 1 MG tablet  Commonly known as:  FOLVITE  Take 1 tablet (1 mg total) by mouth daily.     furosemide 40 MG tablet  Commonly known as:  LASIX  Take 1 tablet (40 mg total) by mouth daily.     HYDROcodone-acetaminophen 5-325 MG per tablet  Commonly known as:  NORCO/VICODIN  Take 1-2 tablets by mouth every 6 hours as needed for pain.     insulin glargine 100 UNIT/ML injection  Commonly known as:  LANTUS  Inject 0.05 mLs (5 Units total) into the skin at bedtime. For high blood sugar control     lisinopril 20 MG tablet  Commonly known as:  PRINIVIL,ZESTRIL  Take 20 mg by mouth daily.     metFORMIN 1000 MG tablet  Commonly known as:  GLUCOPHAGE  Take 0.5 tablets (500 mg total) by mouth 2 (two) times daily with a meal. For high blood sugar control     multivitamin with minerals Tabs tablet  Take 1 tablet by mouth daily.     naproxen 500 MG tablet  Commonly known as:  NAPROSYN  Take 1 tablet (500 mg total) by mouth 2 (two) times daily.     Potassium Chloride ER 20 MEQ Tbcr  Take 20 mEq by mouth daily. For low potassium supplement     pravastatin 10 MG tablet  Commonly known as:  PRAVACHOL  Take 1 tablet (10 mg total) by mouth daily. For high cholesterol control     sertraline 100 MG tablet  Commonly known as:  ZOLOFT  Take 1 tablet (100 mg total) by mouth at bedtime. For depression     thiamine 100 MG tablet  Take 1 tablet (100 mg total) by mouth daily.     traZODone 100 MG tablet  Commonly known as:  DESYREL  Take 1 tablet (100 mg total) by mouth at bedtime. For sleep           Follow-up Information    Follow up with Javier Docker, MD.   Specialty:  Orthopedic Surgery   Why:  For specialized orthopedic evaluation   Contact information:   [address] [place] [address] [phone]       Follow up with Your primary care doctor. Schedule an  appointment as soon as possible for a visit in 1 month.   Why:  Hospital follow up of your heart failure, diabetes.       Time coordinating discharge: 35 minutes.  Signed:  Debe Anfinson  Pager [phone] [facility] 02/27/2014, 9:56 AM

## 2014-02-27 NOTE — Progress Notes (Signed)
Clinical Social Work  CSW faxed DC summary to [facility] who is agreeable to accept patient. CSW prepared DC packet with FL2, DC summary, and hard scripts included. CSW informed patient at bedside of DC plans and patient agreeable to SNF with PTAR for transportation. CSW informed mom via phone who reports she is appreciative of CSW assistance and glad that patient will be close to home.  RN to call report to SNF. PTAR #: [id number].  CSW is signing off but available if needed.  [place], [place] [phone]

## 2014-02-27 NOTE — Progress Notes (Signed)
Report called to [facility] and given to [employer]. No questions at this time. Phone number left with Sue Lush for further questions.

## 2014-02-27 NOTE — Discharge Instructions (Signed)
Rest, Ice intermittently (in the first 24-48 hours), Gentle compression with an Ace wrap, and elevate (Limb above the level of the heart)   Take up to 400mg  of ibuprofen (that is usually 2 over the counter pills)  3 times a day for 5 days. Take with food.  Please follow with your primary care doctor in the next 2 days for a check-up. They must obtain records for further management.   Do not hesitate to return to the Emergency Department for any new, worsening or concerning symptoms.    Arthralgia Arthralgia is joint pain. A joint is a place where two bones meet. Joint pain can happen for many reasons. The joint can be bruised, stiff, infected, or weak from aging. Pain usually goes away after resting and taking medicine for soreness.  HOME CARE  Rest the joint as told by your doctor.  Keep the sore joint raised (elevated) for the first 24 hours.  Put ice on the joint area.  Put ice in a plastic bag.  Place a towel between your skin and the bag.  Leave the ice on for 15-20 minutes, 03-04 times a day.  Wear your splint, casting, elastic bandage, or sling as told by your doctor.  Only take medicine as told by your doctor. Do not take aspirin.  Use crutches as told by your doctor. Do not put weight on the joint until told to by your doctor. GET HELP RIGHT AWAY IF:   You have bruising, puffiness (swelling), or more pain.  Your fingers or toes turn blue or start to lose feeling (numb).  Your medicine does not lessen the pain.  Your pain becomes severe.  You have a temperature by mouth above 102 F (38.9 C), not controlled by medicine.  You cannot move or use the joint. MAKE SURE YOU:   Understand these instructions.  Will watch your condition.  Will get help right away if you are not doing well or get worse. Document Released: 03/30/2009 Document Revised: 07/04/2011 Document Reviewed: 03/30/2009 [facility] Patient Information 2015 [place], [place]. This information is not  intended to replace advice given to you by your health care provider. Make sure you discuss any questions you have with your health care provider.  Finding Treatment for Alcohol and Drug Addiction It can be hard to find the right place to get professional treatment. Here are some important things to consider:  There are different types of treatment to choose from.  Some programs are live-in (residential) while others are not (outpatient). Sometimes a combination is offered.  No single type of program is right for everyone.  Most treatment programs involve a combination of education, counseling, and a 12-step, spiritually-based approach.  There are non-spiritually based programs (not 12-step).  Some treatment programs are government sponsored. They are geared for patients without private insurance.  Treatment programs can vary in many respects such as:  Cost and types of insurance accepted.  Types of on-site medical services offered.  Length of stay, setting, and size.  Overall philosophy of treatment. A person may need specialized treatment or have needs not addressed by all programs. For example, adolescents need treatment appropriate for their age. Other people have secondary disorders that must be managed as well. Secondary conditions can include mental illness, such as depression or diabetes. Often, a period of detoxification from alcohol or drugs is needed. This requires medical supervision and not all programs offer this. THINGS TO CONSIDER WHEN SELECTING A TREATMENT PROGRAM   Is the program  certified by the appropriate government agency? Even private programs must be certified and employ certified professionals.  Does the program accept your insurance? If not, can a payment plan be set up?  Is the facility clean, organized, and well run? Do they allow you to speak with graduates who can share their treatment experience with you? Can you tour the facility? Can you meet with  staff?  Does the program meet the full range of individual needs?  Does the treatment program address sexual orientation and physical disabilities? Do they provide age, gender, and culturally appropriate treatment services?  Is treatment available in languages other than English?  Is long-term aftercare support or guidance encouraged and provided?  Is assessment of an individual's treatment plan ongoing to ensure it meets changing needs?  Does the program use strategies to encourage reluctant patients to remain in treatment long enough to increase the likelihood of success?  Does the program offer counseling (individual or group) and other behavioral therapies?  Does the program offer medicine as part of the treatment regimen, if needed?  Is there ongoing monitoring of possible relapse? Is there a defined relapse prevention program? Are services or referrals offered to family members to ensure they understand addiction and the recovery process? This would help them support the recovering individual.  Are 12-step meetings held at the center or is transport available for patients to attend outside meetings? In countries outside of the [place], Magazine features editorsee local directories for contact information for services in your area. Document Released: 03/10/2005 Document Revised: 07/04/2011 Document Reviewed: 09/20/2007 [facility] Patient Information 2015 [place], [place]. This information is not intended to replace advice given to you by your health care provider. Make sure you discuss any questions you have with your health care provider.  Heart Failure Heart failure means your heart has trouble pumping blood. This makes it hard for your body to work well. Heart failure is usually a long-term (chronic) condition. You must take good care of yourself and follow your doctor's treatment plan. HOME CARE  Take your heart medicine as told by your doctor.  Do not stop taking medicine unless your doctor  tells you to.  Do not skip any dose of medicine.  Refill your medicines before they run out.  Take other medicines only as told by your doctor or pharmacist.  Stay active if told by your doctor. The elderly and people with severe heart failure should talk with a doctor about physical activity.  Eat heart-healthy foods. Choose foods that are without trans fat and are low in saturated fat, cholesterol, and salt (sodium). This includes fresh or frozen fruits and vegetables, fish, lean meats, fat-free or low-fat dairy foods, whole grains, and high-fiber foods. Lentils and dried peas and beans (legumes) are also good choices.  Limit salt if told by your doctor.  Cook in a healthy way. Roast, grill, broil, bake, poach, steam, or stir-fry foods.  Limit fluids as told by your doctor.  Weigh yourself every morning. Do this after you pee (urinate) and before you eat breakfast. Write down your weight to give to your doctor.  Take your blood pressure and write it down if your doctor tells you to.  Ask your doctor how to check your pulse. Check your pulse as told.  Lose weight if told by your doctor.  Stop smoking or chewing tobacco. Do not use gum or patches that help you quit without your doctor's approval.  Schedule and go to doctor visits as told.  Nonpregnant women should have no more than 1 drink a day. Men should have no more than 2 drinks a day. Talk to your doctor about drinking alcohol.  Stop illegal drug use.  Stay current with shots (immunizations).  Manage your health conditions as told by your doctor.  Learn to manage your stress.  Rest when you are tired.  If it is really hot outside:  Avoid intense activities.  Use air conditioning or fans, or get in a cooler place.  Avoid caffeine and alcohol.  Wear loose-fitting, lightweight, and light-colored clothing.  If it is really cold outside:  Avoid intense activities.  Layer your clothing.  Wear mittens or  gloves, a hat, and a scarf when going outside.  Avoid alcohol.  Learn about heart failure and get support as needed.  Get help to maintain or improve your quality of life and your ability to care for yourself as needed. GET HELP IF:   You gain 03 lb/1.4 kg or more in 1 day or 05 lb/2.3 kg in a week.  You are more short of breath than usual.  You cannot do your normal activities.  You tire easily.  You cough more than normal, especially with activity.  You have any or more puffiness (swelling) in areas such as your hands, feet, ankles, or belly (abdomen).  You cannot sleep because it is hard to breathe.  You feel like your heart is beating fast (palpitations).  You get dizzy or light-headed when you stand up. GET HELP RIGHT AWAY IF:   You have trouble breathing.  There is a change in mental status, such as becoming less alert or not being able to focus.  You have chest pain or discomfort.  You faint. MAKE SURE YOU:   Understand these instructions.  Will watch your condition.  Will get help right away if you are not doing well or get worse. Document Released: 01/19/2008 Document Revised: 08/26/2013 Document Reviewed: 05/28/2012 [facility] Patient Information 2015 [place], [place]. This information is not intended to replace advice given to you by your health care provider. Make sure you discuss any questions you have with your health care provider.

## 2014-02-27 NOTE — Progress Notes (Signed)
   Subjective: Patient says breathing is better but not at best.  Denies CP   Objective: Filed Vitals:   02/26/14 2114 02/27/14 0500 02/27/14 0536 02/27/14 0622  BP: 125/89  116/76   Pulse: 81  86   Temp: 98.1 F (36.7 C)  98.3 F (36.8 C)   TempSrc: Oral  Oral   Resp: 18  18   Height:      Weight:  172 lb 13.5 oz (78.4 kg)  170 lb 3.1 oz (77.2 kg)  SpO2: 97%  94%    Weight change: -1 lb 1.6 oz (-0.5 kg)  Intake/Output Summary (Last 24 hours) at 02/27/14 1552 Last data filed at 02/27/14 0802  Gross per 24 hour  Intake    120 ml  Output    800 ml  Net   -680 ml   I/O incomplete    General: Alert, awake, oriented x3, in no acute distress Neck:  JVP is normal Heart: Regular rate and rhythm, without murmurs, rubs, gallops.  Lungs: Clear to auscultation.  No rales or wheezes. Exemities:  No edema.   Neuro: Grossly intact, nonfocal.   Lab Results: Results for orders placed or performed during the hospital encounter of 02/20/14 (from the past 24 hour(s))  Glucose, capillary     Status: None   Collection Time: 02/26/14  7:29 AM  Result Value Ref Range   Glucose-Capillary 87 70 - 99 mg/dL  Glucose, capillary     Status: None   Collection Time: 02/26/14 11:35 AM  Result Value Ref Range   Glucose-Capillary 94 70 - 99 mg/dL  Glucose, capillary     Status: Abnormal   Collection Time: 02/26/14  4:47 PM  Result Value Ref Range   Glucose-Capillary 133 (H) 70 - 99 mg/dL  Glucose, capillary     Status: Abnormal   Collection Time: 02/26/14  9:11 PM  Result Value Ref Range   Glucose-Capillary 200 (H) 70 - 99 mg/dL  Basic metabolic panel     Status: Abnormal   Collection Time: 02/27/14  5:20 AM  Result Value Ref Range   Sodium 136 (L) 137 - 147 mEq/L   Potassium 3.7 3.7 - 5.3 mEq/L   Chloride 101 96 - 112 mEq/L   CO2 21 19 - 32 mEq/L   Glucose, Bld 112 (H) 70 - 99 mg/dL   BUN 11 6 - 23 mg/dL   Creatinine, Ser 2.33 0.50 - 1.35 mg/dL   Calcium 8.2 (L) 8.4 - 10.5 mg/dL   GFR  calc non Af Amer >90 >90 mL/min   GFR calc Af Amer >90 >90 mL/min   Anion gap 14 5 - 15    Studies/Results: No results found.  Medications: Reviewed   @PROBHOSP @ 1.  Acute on chronic systolic CHF.  Patient has diuresed since admit  BNP on 11/2 was 5300 I would give additional dose of lasix IV today  Strict I/O Increase coreg to 6.25 bid.    OVerall concerned about compliance with meds as outpatinet     2  CAD  No symtpoms of angina  3. Substance abuse.  EtoH and cocaine.  Limiting  Needs to stop.     LOS: 7 days   Dietrich Pates 02/27/2014, 7:12 AM

## 2014-02-27 NOTE — Progress Notes (Signed)
Attempted to call report to [facility]. Writer left on hold.

## 2014-02-27 NOTE — Progress Notes (Addendum)
Physical Therapy Treatment Patient Details Name: [name] CancerJerry W Feehan MRN: [id number] DOB: 08-23-1955 Today's Date: 02/27/2014    History of Present Illness 58 y/o male with hx of CAD, Etoh dependence, DM2, HTN, depression, HLD, presents with one-month history of worsening right knee pain. The patient states that he had a mechanical fall approximately 3 months ago injuring his right knee. Since that period of time he has had worsening knee pain, particularly in the past 1 month.Dx of alcoholic ketoacidosis, CHF, PNA.     PT Comments    **Pt required assist for supine to sit, for sit to stand, and to pivot to recliner. He reported dizziness in sitting and standing and was therefore unable to ambulate. BP 114/75 in sitting. 119/78 in supine earlier this morning.  Per Dr Darnelle Catalanama pt may be dizzy due to diuretics. *  Follow Up Recommendations  SNF     Equipment Recommendations  Wheelchair (measurements PT)    Recommendations for Other Services       Precautions / Restrictions Precautions Precautions: Fall Restrictions Weight Bearing Restrictions: No    Mobility  Bed Mobility Overal bed mobility: Needs Assistance Bed Mobility: Supine to Sit     Supine to sit: Mod assist     General bed mobility comments: mod A to raise trunk  Transfers Overall transfer level: Needs assistance Equipment used: Rolling walker (2 wheeled) Transfers: Sit to/from BJ'sStand;Stand Pivot Transfers Sit to Stand: Max assist;From elevated surface Stand pivot transfers: Mod assist       General transfer comment: assist to rise from elevated bed, pt unable to tolerate standing with RW due to reported dizziness and weakness, pt also dizzy in sitting, seated BP 115/74, unable to obtain standing BP as pt unable to tolerate standing  Ambulation/Gait     Assistive device: Rolling walker (2 wheeled)           Stairs            Wheelchair Mobility    Modified Rankin (Stroke Patients Only)        Balance   Sitting-balance support: Feet supported;Bilateral upper extremity supported Sitting balance-Leahy Scale: Fair Sitting balance - Comments: pt sat on EOB x 4 minutes, limited tolerance due to dizziness     Standing balance-Leahy Scale: Poor                      Cognition Arousal/Alertness: Lethargic Behavior During Therapy: WFL for tasks assessed/performed Overall Cognitive Status: Within Functional Limits for tasks assessed                      Exercises      General Comments        Pertinent Vitals/Pain Pain Assessment: No/denies pain    Home Living                      Prior Function            PT Goals (current goals can now be found in the care plan section) Acute Rehab PT Goals Patient Stated Goal: to walk PT Goal Formulation: With patient Time For Goal Achievement: 03/11/14 Potential to Achieve Goals: Fair Progress towards PT goals: Not progressing toward goals - comment (dizziness in sitting/standing)    Frequency  Min 3X/week    PT Plan Current plan remains appropriate    Co-evaluation             End of Session Equipment Utilized During Treatment:  Gait belt Activity Tolerance: Patient limited by fatigue;Patient limited by lethargy (dizziness) Patient left: in chair;with call bell/phone within reach;with family/visitor present;Other (comment)     Time: 1110-1130 PT Time Calculation (min): 20 min  Charges:  $Therapeutic Activity: 8-22 mins                    G Codes:      Tamala Ser 02/27/2014, 11:44 AM [phone]

## 2014-04-03 ENCOUNTER — Encounter (HOSPITAL_COMMUNITY): Payer: Self-pay | Admitting: Cardiovascular Disease

## 2015-03-10 ENCOUNTER — Inpatient Hospital Stay (HOSPITAL_COMMUNITY): Payer: Medicare Other

## 2015-03-10 ENCOUNTER — Emergency Department (HOSPITAL_COMMUNITY): Payer: Medicare Other

## 2015-03-10 ENCOUNTER — Inpatient Hospital Stay (HOSPITAL_COMMUNITY)
Admission: EM | Admit: 2015-03-10 | Discharge: 2015-03-12 | DRG: 293 | Disposition: A | Discharge status: Home Health | Payer: Medicare Other | Attending: Internal Medicine | Admitting: Internal Medicine

## 2015-03-10 ENCOUNTER — Encounter (HOSPITAL_COMMUNITY): Payer: Self-pay

## 2015-03-10 ENCOUNTER — Other Ambulatory Visit (HOSPITAL_COMMUNITY): Payer: Self-pay

## 2015-03-10 DIAGNOSIS — Z7984 Long term (current) use of oral hypoglycemic drugs: Secondary | ICD-10-CM

## 2015-03-10 DIAGNOSIS — F329 Major depressive disorder, single episode, unspecified: Secondary | ICD-10-CM | POA: Diagnosis present

## 2015-03-10 DIAGNOSIS — R7989 Other specified abnormal findings of blood chemistry: Secondary | ICD-10-CM

## 2015-03-10 DIAGNOSIS — I2510 Atherosclerotic heart disease of native coronary artery without angina pectoris: Secondary | ICD-10-CM | POA: Diagnosis present

## 2015-03-10 DIAGNOSIS — Z7982 Long term (current) use of aspirin: Secondary | ICD-10-CM | POA: Diagnosis not present

## 2015-03-10 DIAGNOSIS — I272 Other secondary pulmonary hypertension: Secondary | ICD-10-CM | POA: Diagnosis present

## 2015-03-10 DIAGNOSIS — I5023 Acute on chronic systolic (congestive) heart failure: Secondary | ICD-10-CM | POA: Diagnosis present

## 2015-03-10 DIAGNOSIS — F419 Anxiety disorder, unspecified: Secondary | ICD-10-CM | POA: Diagnosis present

## 2015-03-10 DIAGNOSIS — F1010 Alcohol abuse, uncomplicated: Secondary | ICD-10-CM | POA: Diagnosis present

## 2015-03-10 DIAGNOSIS — Z9114 Patient's other noncompliance with medication regimen: Secondary | ICD-10-CM | POA: Diagnosis not present

## 2015-03-10 DIAGNOSIS — Z794 Long term (current) use of insulin: Secondary | ICD-10-CM

## 2015-03-10 DIAGNOSIS — E1100 Type 2 diabetes mellitus with hyperosmolarity without nonketotic hyperglycemic-hyperosmolar coma (NKHHC): Secondary | ICD-10-CM

## 2015-03-10 DIAGNOSIS — I110 Hypertensive heart disease with heart failure: Principal | ICD-10-CM | POA: Diagnosis present

## 2015-03-10 DIAGNOSIS — F17210 Nicotine dependence, cigarettes, uncomplicated: Secondary | ICD-10-CM | POA: Diagnosis present

## 2015-03-10 DIAGNOSIS — E1165 Type 2 diabetes mellitus with hyperglycemia: Secondary | ICD-10-CM

## 2015-03-10 DIAGNOSIS — I509 Heart failure, unspecified: Secondary | ICD-10-CM

## 2015-03-10 DIAGNOSIS — I252 Old myocardial infarction: Secondary | ICD-10-CM | POA: Diagnosis not present

## 2015-03-10 DIAGNOSIS — R778 Other specified abnormalities of plasma proteins: Secondary | ICD-10-CM

## 2015-03-10 DIAGNOSIS — F10220 Alcohol dependence with intoxication, uncomplicated: Secondary | ICD-10-CM | POA: Diagnosis not present

## 2015-03-10 DIAGNOSIS — Z79899 Other long term (current) drug therapy: Secondary | ICD-10-CM | POA: Diagnosis not present

## 2015-03-10 DIAGNOSIS — R0602 Shortness of breath: Secondary | ICD-10-CM | POA: Diagnosis not present

## 2015-03-10 DIAGNOSIS — E118 Type 2 diabetes mellitus with unspecified complications: Secondary | ICD-10-CM | POA: Diagnosis present

## 2015-03-10 LAB — CBC WITH DIFFERENTIAL/PLATELET
Basophils Absolute: 0 10*3/uL (ref 0.0–0.1)
Basophils Relative: 1 %
Eosinophils Absolute: 0.1 10*3/uL (ref 0.0–0.7)
Eosinophils Relative: 3 %
HEMATOCRIT: 40 % (ref 39.0–52.0)
HEMOGLOBIN: 13.8 g/dL (ref 13.0–17.0)
LYMPHS ABS: 2.7 10*3/uL (ref 0.7–4.0)
Lymphocytes Relative: 53 %
MCH: 30.5 pg (ref 26.0–34.0)
MCHC: 34.5 g/dL (ref 30.0–36.0)
MCV: 88.5 fL (ref 78.0–100.0)
MONO ABS: 0.4 10*3/uL (ref 0.1–1.0)
MONOS PCT: 8 %
Neutro Abs: 1.9 10*3/uL (ref 1.7–7.7)
Neutrophils Relative %: 37 %
Platelets: 194 10*3/uL (ref 150–400)
RBC: 4.52 MIL/uL (ref 4.22–5.81)
RDW: 12.4 % (ref 11.5–15.5)
WBC: 5.2 10*3/uL (ref 4.0–10.5)

## 2015-03-10 LAB — BASIC METABOLIC PANEL
ANION GAP: 13 (ref 5–15)
Anion gap: 9 (ref 5–15)
BUN: 7 mg/dL (ref 6–20)
BUN: 8 mg/dL (ref 6–20)
CALCIUM: 8.4 mg/dL — AB (ref 8.9–10.3)
CHLORIDE: 104 mmol/L (ref 101–111)
CHLORIDE: 104 mmol/L (ref 101–111)
CO2: 22 mmol/L (ref 22–32)
CO2: 19 mmol/L — AB (ref 22–32)
CREATININE: 1 mg/dL (ref 0.61–1.24)
Calcium: 8.1 mg/dL — ABNORMAL LOW (ref 8.9–10.3)
Creatinine, Ser: 1.1 mg/dL (ref 0.61–1.24)
GFR calc Af Amer: >60 mL/min (ref >60)
GFR calc non Af Amer: >60 mL/min (ref >60)
GFR calc non Af Amer: >60 mL/min (ref >60)
Glucose, Bld: 440 mg/dL — ABNORMAL HIGH (ref 65–99)
Glucose, Bld: 306 mg/dL — ABNORMAL HIGH (ref 65–99)
Potassium: 3.4 mmol/L — ABNORMAL LOW (ref 3.5–5.1)
Potassium: 3.4 mmol/L — ABNORMAL LOW (ref 3.5–5.1)
Sodium: 135 mmol/L (ref 135–145)
Sodium: 136 mmol/L (ref 135–145)

## 2015-03-10 LAB — TROPONIN I: Troponin I: 0.08 ng/mL — ABNORMAL HIGH (ref <0.031)

## 2015-03-10 LAB — CBG MONITORING, ED: GLUCOSE-CAPILLARY: 574 mg/dL — AB (ref 65–99)

## 2015-03-10 LAB — GLUCOSE, CAPILLARY
GLUCOSE-CAPILLARY: 348 mg/dL — AB (ref 65–99)
GLUCOSE-CAPILLARY: 201 mg/dL — AB (ref 65–99)
Glucose-Capillary: 279 mg/dL — ABNORMAL HIGH (ref 65–99)
Glucose-Capillary: 526 mg/dL — ABNORMAL HIGH (ref 65–99)

## 2015-03-10 LAB — BRAIN NATRIURETIC PEPTIDE: B Natriuretic Peptide: 1361.4 pg/mL — ABNORMAL HIGH (ref 0.0–100.0)

## 2015-03-10 MED ORDER — SODIUM CHLORIDE 0.9 % IV SOLN: 250.0000 mL | INTRAVENOUS | Status: DC | PRN

## 2015-03-10 MED ORDER — TRAZODONE HCL 100 MG PO TABS
100.0000 mg | ORAL_TABLET | Freq: Every day | ORAL | Status: DC
  Administered 2015-03-10 – 2015-03-11 (×2): 100 mg via ORAL
  Filled 2015-03-10 (×2): qty 1

## 2015-03-10 MED ORDER — INFLUENZA VAC SPLIT QUAD 0.5 ML IM SUSY
0.5000 mL | PREFILLED_SYRINGE | INTRAMUSCULAR | Status: DC
  Filled 2015-03-10: qty 0.5

## 2015-03-10 MED ORDER — POTASSIUM CHLORIDE CRYS ER 20 MEQ PO TBCR
40.0000 meq | EXTENDED_RELEASE_TABLET | Freq: Two times a day (BID) | ORAL | Status: AC
  Administered 2015-03-10 (×2): 40 meq via ORAL
  Filled 2015-03-10 (×2): qty 2

## 2015-03-10 MED ORDER — ASPIRIN EC 81 MG PO TBEC
81.0000 mg | DELAYED_RELEASE_TABLET | Freq: Every day | ORAL | Status: DC
  Administered 2015-03-10 – 2015-03-12 (×3): 81 mg via ORAL
  Filled 2015-03-10 (×3): qty 1

## 2015-03-10 MED ORDER — CARVEDILOL 6.25 MG PO TABS
6.2500 mg | ORAL_TABLET | Freq: Two times a day (BID) | ORAL | Status: DC
  Administered 2015-03-10 – 2015-03-12 (×5): 6.25 mg via ORAL
  Filled 2015-03-10 (×4): qty 1

## 2015-03-10 MED ORDER — ASPIRIN 81 MG PO CHEW
324.0000 mg | CHEWABLE_TABLET | Freq: Once | ORAL | Status: AC
  Administered 2015-03-10: 324 mg via ORAL
  Filled 2015-03-10: qty 4

## 2015-03-10 MED ORDER — BUPROPION HCL ER (XL) 150 MG PO TB24
150.0000 mg | ORAL_TABLET | Freq: Every day | ORAL | Status: DC
  Administered 2015-03-10 – 2015-03-12 (×3): 150 mg via ORAL
  Filled 2015-03-10 (×3): qty 1

## 2015-03-10 MED ORDER — ONDANSETRON HCL 4 MG/2ML IJ SOLN: 4.0000 mg | Freq: Four times a day (QID) | INTRAMUSCULAR | Status: DC | PRN

## 2015-03-10 MED ORDER — POTASSIUM CHLORIDE CRYS ER 20 MEQ PO TBCR
20.0000 meq | EXTENDED_RELEASE_TABLET | Freq: Every day | ORAL | Status: DC
  Administered 2015-03-11 – 2015-03-12 (×2): 20 meq via ORAL
  Filled 2015-03-10 (×2): qty 1

## 2015-03-10 MED ORDER — FUROSEMIDE 10 MG/ML IJ SOLN: 40.0000 mg | Freq: Every day | INTRAMUSCULAR | Status: DC

## 2015-03-10 MED ORDER — PRAVASTATIN SODIUM 10 MG PO TABS
10.0000 mg | ORAL_TABLET | Freq: Every day | ORAL | Status: DC
  Administered 2015-03-10 – 2015-03-11 (×2): 10 mg via ORAL
  Filled 2015-03-10 (×4): qty 1

## 2015-03-10 MED ORDER — FOLIC ACID 1 MG PO TABS
1.0000 mg | ORAL_TABLET | Freq: Every day | ORAL | Status: DC
  Administered 2015-03-10 – 2015-03-12 (×3): 1 mg via ORAL
  Filled 2015-03-10 (×3): qty 1

## 2015-03-10 MED ORDER — IPRATROPIUM-ALBUTEROL 0.5-2.5 (3) MG/3ML IN SOLN
3.0000 mL | Freq: Once | RESPIRATORY_TRACT | Status: AC
  Administered 2015-03-10: 3 mL via RESPIRATORY_TRACT
  Filled 2015-03-10: qty 3

## 2015-03-10 MED ORDER — LORAZEPAM 1 MG PO TABS
1.0000 mg | ORAL_TABLET | Freq: Four times a day (QID) | ORAL | Status: DC | PRN
  Administered 2015-03-11: 1 mg via ORAL
  Filled 2015-03-10: qty 1

## 2015-03-10 MED ORDER — INSULIN ASPART 100 UNIT/ML ~~LOC~~ SOLN
10.0000 [IU] | Freq: Once | SUBCUTANEOUS | Status: AC
  Administered 2015-03-10: 10 [IU] via SUBCUTANEOUS
  Filled 2015-03-10: qty 1

## 2015-03-10 MED ORDER — LISINOPRIL 20 MG PO TABS: 20.0000 mg | ORAL_TABLET | Freq: Every day | ORAL | Status: DC

## 2015-03-10 MED ORDER — INSULIN GLARGINE 100 UNIT/ML ~~LOC~~ SOLN
20.0000 [IU] | Freq: Two times a day (BID) | SUBCUTANEOUS | Status: DC
  Administered 2015-03-10 – 2015-03-11 (×3): 20 [IU] via SUBCUTANEOUS
  Filled 2015-03-10 (×4): qty 0.2

## 2015-03-10 MED ORDER — FUROSEMIDE 10 MG/ML IJ SOLN
40.0000 mg | Freq: Once | INTRAMUSCULAR | Status: AC
  Administered 2015-03-10: 40 mg via INTRAVENOUS
  Filled 2015-03-10: qty 4

## 2015-03-10 MED ORDER — ACETAMINOPHEN 325 MG PO TABS
650.0000 mg | ORAL_TABLET | ORAL | Status: DC | PRN
  Administered 2015-03-10 – 2015-03-11 (×2): 650 mg via ORAL
  Filled 2015-03-10 (×2): qty 2

## 2015-03-10 MED ORDER — SPIRONOLACTONE 25 MG PO TABS
12.5000 mg | ORAL_TABLET | Freq: Every day | ORAL | Status: DC
  Administered 2015-03-10 – 2015-03-12 (×3): 12.5 mg via ORAL
  Filled 2015-03-10 (×3): qty 1

## 2015-03-10 MED ORDER — INSULIN ASPART 100 UNIT/ML ~~LOC~~ SOLN
0.0000 [IU] | Freq: Three times a day (TID) | SUBCUTANEOUS | Status: DC
Start: 2015-03-10 — End: 2015-03-10
  Administered 2015-03-10: 8 [IU] via SUBCUTANEOUS

## 2015-03-10 MED ORDER — VITAMIN B-1 100 MG PO TABS
100.0000 mg | ORAL_TABLET | Freq: Every day | ORAL | Status: DC
  Administered 2015-03-10 – 2015-03-12 (×3): 100 mg via ORAL
  Filled 2015-03-10 (×3): qty 1

## 2015-03-10 MED ORDER — SODIUM CHLORIDE 0.9 % IJ SOLN: 3.0000 mL | INTRAMUSCULAR | Status: DC | PRN

## 2015-03-10 MED ORDER — INSULIN ASPART 100 UNIT/ML ~~LOC~~ SOLN
0.0000 [IU] | Freq: Every day | SUBCUTANEOUS | Status: DC
  Administered 2015-03-10: 2 [IU] via SUBCUTANEOUS
  Administered 2015-03-11: 3 [IU] via SUBCUTANEOUS

## 2015-03-10 MED ORDER — NITROGLYCERIN 0.4 MG SL SUBL
SUBLINGUAL_TABLET | SUBLINGUAL | Status: AC
  Administered 2015-03-10: 0.8 mg
  Filled 2015-03-10: qty 3

## 2015-03-10 MED ORDER — FUROSEMIDE 10 MG/ML IJ SOLN
40.0000 mg | Freq: Two times a day (BID) | INTRAMUSCULAR | Status: DC
  Administered 2015-03-10 – 2015-03-12 (×5): 40 mg via INTRAVENOUS
  Filled 2015-03-10 (×5): qty 4

## 2015-03-10 MED ORDER — GLUCERNA SHAKE PO LIQD
237.0000 mL | Freq: Three times a day (TID) | ORAL | Status: DC
  Administered 2015-03-10: 237 mL via ORAL

## 2015-03-10 MED ORDER — GLUCERNA SHAKE PO LIQD
237.0000 mL | ORAL | Status: DC
  Administered 2015-03-11: 237 mL via ORAL

## 2015-03-10 MED ORDER — ENOXAPARIN SODIUM 40 MG/0.4ML ~~LOC~~ SOLN
40.0000 mg | Freq: Every day | SUBCUTANEOUS | Status: DC
  Administered 2015-03-10 – 2015-03-12 (×3): 40 mg via SUBCUTANEOUS
  Filled 2015-03-10 (×3): qty 0.4

## 2015-03-10 MED ORDER — LORAZEPAM 2 MG/ML IJ SOLN: 1.0000 mg | Freq: Four times a day (QID) | INTRAMUSCULAR | Status: DC | PRN

## 2015-03-10 MED ORDER — INSULIN ASPART 100 UNIT/ML ~~LOC~~ SOLN
0.0000 [IU] | Freq: Three times a day (TID) | SUBCUTANEOUS | Status: DC
  Administered 2015-03-10: 11 [IU] via SUBCUTANEOUS
  Administered 2015-03-10: 15 [IU] via SUBCUTANEOUS
  Administered 2015-03-11: 2 [IU] via SUBCUTANEOUS
  Administered 2015-03-11: 5 [IU] via SUBCUTANEOUS
  Administered 2015-03-11 – 2015-03-12 (×2): 3 [IU] via SUBCUTANEOUS
  Administered 2015-03-12: 5 [IU] via SUBCUTANEOUS

## 2015-03-10 MED ORDER — ADULT MULTIVITAMIN W/MINERALS CH
1.0000 | ORAL_TABLET | Freq: Every day | ORAL | Status: DC
  Administered 2015-03-10 – 2015-03-12 (×3): 1 via ORAL
  Filled 2015-03-10 (×3): qty 1

## 2015-03-10 MED ORDER — LISINOPRIL 20 MG PO TABS
20.0000 mg | ORAL_TABLET | Freq: Every day | ORAL | Status: DC
  Administered 2015-03-10 – 2015-03-12 (×3): 20 mg via ORAL
  Filled 2015-03-10 (×3): qty 1

## 2015-03-10 MED ORDER — INSULIN ASPART 100 UNIT/ML ~~LOC~~ SOLN
3.0000 [IU] | Freq: Three times a day (TID) | SUBCUTANEOUS | Status: DC
  Administered 2015-03-10 – 2015-03-11 (×3): 3 [IU] via SUBCUTANEOUS

## 2015-03-10 MED ORDER — POTASSIUM CHLORIDE CRYS ER 20 MEQ PO TBCR: 20.0000 meq | EXTENDED_RELEASE_TABLET | Freq: Every day | ORAL | Status: DC

## 2015-03-10 MED ORDER — SODIUM CHLORIDE 0.9 % IJ SOLN
3.0000 mL | Freq: Two times a day (BID) | INTRAMUSCULAR | Status: DC
  Administered 2015-03-10 – 2015-03-12 (×5): 3 mL via INTRAVENOUS

## 2015-03-10 MED ORDER — ASPIRIN EC 81 MG PO TBEC: 81.0000 mg | DELAYED_RELEASE_TABLET | Freq: Every day | ORAL | Status: DC

## 2015-03-10 MED ORDER — INSULIN GLARGINE 100 UNIT/ML ~~LOC~~ SOLN
5.0000 [IU] | Freq: Every day | SUBCUTANEOUS | Status: DC
  Filled 2015-03-10: qty 0.05

## 2015-03-10 MED ORDER — NAPROXEN 500 MG PO TABS
500.0000 mg | ORAL_TABLET | Freq: Two times a day (BID) | ORAL | Status: DC | PRN
  Administered 2015-03-11: 500 mg via ORAL
  Filled 2015-03-10 (×2): qty 1

## 2015-03-10 MED ORDER — METFORMIN HCL 500 MG PO TABS: 500.0000 mg | ORAL_TABLET | Freq: Two times a day (BID) | ORAL | Status: DC

## 2015-03-10 MED ORDER — SERTRALINE HCL 100 MG PO TABS
100.0000 mg | ORAL_TABLET | Freq: Every day | ORAL | Status: DC
  Administered 2015-03-10 – 2015-03-11 (×2): 100 mg via ORAL
  Filled 2015-03-10 (×2): qty 1

## 2015-03-10 NOTE — Progress Notes (Signed)
Pt complained of 8/10 squeazing midsternal  chest pain.EKg was done and nitro 0.8mg  was given sublingually. Pt chest pain is relieved at this moment. Md David Stall informed about the situation. Order given for a serial troponin. Will report of to the incoming nurse.

## 2015-03-10 NOTE — ED Notes (Signed)
Admitting at bedside 

## 2015-03-10 NOTE — Progress Notes (Signed)
  Echocardiogram 2D Echocardiogram has been performed.  Delcie Roch 03/10/2015, 5:54 PM

## 2015-03-10 NOTE — Progress Notes (Signed)
Initial Nutrition Assessment   INTERVENTION:  Provide low-carb snacks BID Provide Glucerna Shake once daily, provides 220 kcal and 10 grams of protein   NUTRITION DIAGNOSIS:   Inadequate oral intake related to social / environmental circumstances as evidenced by per patient/family report, percent weight loss.   GOAL:   Patient will meet greater than or equal to 90% of their needs   MONITOR:   PO intake, Supplement acceptance, Labs, Weight trends, Skin  REASON FOR ASSESSMENT:   Malnutrition Screening Tool    ASSESSMENT:   59 y.o. male with h/o CHF, EtOH abuse that is ongoing. Patient presents to the ED with c/o moderate SOB onset 3 days ago.  Pt states that for the past 2 months he has not been eating well due to picking up drinking again. He usually eats 3 meals daily, but for the past 2 months has only been eating 1-2 meals daily and drinking more alcohol. He reports losing 6 lbs during this time with a usual body weight of 186 lbs. Patient has been snacking on crackers and peanut butter in between meals and blood glucose has been very high. Encouraged low carb snacks which pt requests RD to order. Pt declines nutrition-focused physical exam. He denies any nutrition or diet education needs at this time.   Labs: high glucose, low calcium  Diet Order:  Diet 2 gram sodium Room service appropriate?: Yes; Fluid consistency:: Thin; Fluid restriction:: 1200 mL Fluid  Skin:  Reviewed, no issues  Last BM:  11/14  Height:   Ht Readings from Last 1 Encounters:  03/10/15 6\' 3"  (1.905 m)    Weight:   Wt Readings from Last 1 Encounters:  03/10/15 168 lb 9.6 oz (76.476 kg)    Ideal Body Weight:  89.1 kg  BMI:  Body mass index is 21.07 kg/(m^2).  Estimated Nutritional Needs:   Kcal:  2000-2300  Protein:  90-100 grams  Fluid:  2-2.3 L/day (1.2 L restriction currently per MD)  EDUCATION NEEDS:   No education needs identified at this time  Dorothea Ogle RD,  LDN Inpatient Clinical Dietitian Pager: [phone] After Hours Pager: [phone]

## 2015-03-10 NOTE — ED Provider Notes (Signed)
CSN: [id number]   Arrival date & time 03/10/15 0034  History  By signing my name below, I, [name], attest that this documentation has been prepared under the direction and in the presence of [name], MD. Electronically Signed: Bethel [name], ED Scribe. 03/10/2015. 1:55 AM.  Chief Complaint  Patient presents with  . Shortness of Breath    HPI The history is provided by the patient. No language interpreter was used.   Brought in by EMS from home, [name] is a 59 y.o. male with history of CAD, MI, HTN, and DM  who presents to the Emergency Department complaining of constant and moderate SOB with onset 3 days ago. His breathing is worse with laying flat and significantly worsened tonight. Associated symptoms include congestion and dry cough. Pt denies chest pain, fever, chills, nausea, vomiting, LE swelling. He has been using alcohol. No recent long distance travel.   Past Medical History  Diagnosis Date  . Diabetes mellitus   . Hypertension   . Coronary artery disease   . MI (myocardial infarction) (HCC) 43    age 4 per patient  . Mental disorder   . Depression     Past Surgical History  Procedure Laterality Date  . Angioplasty  1984    at age 67  . Left and right heart catheterization with coronary/graft angiogram  08/13/2012    Procedure: LEFT AND RIGHT HEART CATHETERIZATION WITH [name];  Surgeon: Kathleene Hazel, MD;  Location: [facility] CATH LAB;  Service: Cardiovascular;;    Family History  Problem Relation Age of Onset  . Heart attack Father     >60s  . Hypertension Father   . Diabetes Father   . Heart attack Sister   . Hypertension Mother     Social History  Substance Use Topics  . Smoking status: Current Every Day Smoker -- 0.50 packs/day    Types: Cigarettes  . Smokeless tobacco: Never Used  . Alcohol Use: 1.2 oz/week    2 Cans of beer per week     Comment: drinking daily (2 40oz beer a day)     Review of Systems   Constitutional: Negative for fever and chills.  HENT: Positive for congestion.   Respiratory: Positive for cough and shortness of breath.   Cardiovascular: Negative for chest pain and leg swelling.  Gastrointestinal: Negative for nausea and vomiting.   Home Medications   Prior to Admission medications   Medication Sig Start Date End Date Taking? Authorizing Provider  aspirin 81 MG tablet Take 81 mg by mouth daily.   Yes Historical Provider, MD  buPROPion (WELLBUTRIN XL) 150 MG 24 hr tablet Take 150 mg by mouth daily.   Yes Historical Provider, MD  carvedilol (COREG) 6.25 MG tablet Take 1 tablet (6.25 mg total) by mouth 2 (two) times daily with a meal. 02/27/14  Yes Christina P Rama, MD  feeding supplement, GLUCERNA SHAKE, (GLUCERNA SHAKE) LIQD Take 237 mLs by mouth 3 (three) times daily between meals. 02/27/14  Yes Maryruth Bun Rama, MD  folic acid (FOLVITE) 1 MG tablet Take 1 tablet (1 mg total) by mouth daily. 02/27/14  Yes Christina P Rama, MD  furosemide (LASIX) 40 MG tablet Take 1 tablet (40 mg total) by mouth daily. 02/27/14  Yes Maryruth Bun Rama, MD  HYDROcodone-acetaminophen (NORCO/VICODIN) 5-325 MG per tablet Take 1-2 tablets by mouth every 6 hours as needed for pain. 02/27/14  Yes Christina P Rama, MD  insulin glargine (LANTUS) 100 UNIT/ML injection Inject  0.05 mLs (5 Units total) into the skin at bedtime. For high blood sugar control Patient taking differently: Inject 5 Units into the skin daily as needed (high blood sugar). For high blood sugar control 02/27/14  Yes Christina P Rama, MD  lisinopril (PRINIVIL,ZESTRIL) 20 MG tablet Take 20 mg by mouth daily.   Yes Historical Provider, MD  metFORMIN (GLUCOPHAGE) 1000 MG tablet Take 0.5 tablets (500 mg total) by mouth 2 (two) times daily with a meal. For high blood sugar control 12/27/12  Yes Sanjuana Kava, NP  Multiple Vitamin (MULTIVITAMIN WITH MINERALS) TABS tablet Take 1 tablet by mouth daily. 02/27/14  Yes Maryruth Bun Rama, MD  naproxen  (NAPROSYN) 500 MG tablet Take 1 tablet (500 mg total) by mouth 2 (two) times daily. Patient taking differently: Take 500 mg by mouth 2 (two) times daily as needed for mild pain.  12/28/13  Yes Fayrene Helper, PA-C  potassium chloride 20 MEQ TBCR Take 20 mEq by mouth daily. For low potassium supplement 02/27/14  Yes Christina P Rama, MD  pravastatin (PRAVACHOL) 10 MG tablet Take 1 tablet (10 mg total) by mouth daily. For high cholesterol control 12/27/12  Yes Sanjuana Kava, NP  sertraline (ZOLOFT) 100 MG tablet Take 1 tablet (100 mg total) by mouth at bedtime. For depression 12/27/12  Yes Sanjuana Kava, NP  thiamine 100 MG tablet Take 1 tablet (100 mg total) by mouth daily. 02/27/14  Yes Maryruth Bun Rama, MD  traZODone (DESYREL) 100 MG tablet Take 1 tablet (100 mg total) by mouth at bedtime. For sleep 12/27/12  Yes Sanjuana Kava, NP  diazepam (VALIUM) 5 MG tablet Take 1 tablet (5 mg total) by mouth every 8 (eight) hours. Patient not taking: Reported on 03/10/2015 02/27/14   Maryruth Bun Rama, MD  dronabinol (MARINOL) 2.5 MG capsule Take 1 capsule (2.5 mg total) by mouth 2 (two) times daily before lunch and supper. Patient not taking: Reported on 03/10/2015 02/27/14   Maryruth Bun Rama, MD    Allergies  Review of patient's allergies indicates no known allergies.  Triage Vitals: BP 160/99 mmHg  Pulse 89  Resp 18  Ht  (1.905 m)  Wt 172 lb (78.019 kg)  BMI 21.50 kg/m2  SpO2 100%  Physical Exam  Constitutional: He is oriented to person, place, and time. He appears well-developed and well-nourished.  HENT:  Head: Normocephalic and atraumatic.  Eyes: EOM are normal. Pupils are equal, round, and reactive to light.  Neck: Normal range of motion. Neck supple. No JVD present.  Cardiovascular: Normal rate, regular rhythm, normal heart sounds and intact distal pulses.   No murmur heard. Pulmonary/Chest: Effort normal and breath sounds normal. He has no wheezes. He has no rales. He exhibits no tenderness.   Slightly prolonged exhalation phase  Abdominal: Soft. Bowel sounds are normal. He exhibits no distension and no mass. There is no tenderness.  Musculoskeletal: Normal range of motion. He exhibits no edema.  Lymphadenopathy:    He has no cervical adenopathy.  Neurological: He is alert and oriented to person, place, and time. No cranial nerve deficit. He exhibits normal muscle tone. Coordination normal.  Skin: Skin is warm and dry. No rash noted.  Psychiatric: He has a normal mood and affect. His behavior is normal. Judgment and thought content normal.  Nursing note and vitals reviewed.   ED Course  Procedures   DIAGNOSTIC STUDIES: Oxygen Saturation is 100% on RA, normal by my interpretation.    COORDINATION OF CARE:  1:51 AM Discussed treatment plan which includes lab work, CXR, EKG, and a breathing treatment with pt at bedside and pt agreed to plan.  Results for orders placed or performed during the hospital encounter of 03/10/15  Basic metabolic panel  Result Value Ref Range   Sodium 135 135 - 145 mmol/L   Potassium 3.4 (L) 3.5 - 5.1 mmol/L   Chloride 104 101 - 111 mmol/L   CO2 22 22 - 32 mmol/L   Glucose, Bld 440 (H) 65 - 99 mg/dL   BUN 7 6 - 20 mg/dL   Creatinine, Ser 1.61 0.61 - 1.24 mg/dL   Calcium 8.1 (L) 8.9 - 10.3 mg/dL   GFR calc non Af Amer >60 >60 mL/min   GFR calc Af Amer >60 >60 mL/min   Anion gap 9 5 - 15  CBC with Differential  Result Value Ref Range   WBC 5.2 4.0 - 10.5 K/uL   RBC 4.52 4.22 - 5.81 MIL/uL   Hemoglobin 13.8 13.0 - 17.0 g/dL   HCT 09.6 04.5 - 40.9 %   MCV 88.5 78.0 - 100.0 fL   MCH 30.5 26.0 - 34.0 pg   MCHC 34.5 30.0 - 36.0 g/dL   RDW 81.1 91.4 - 78.2 %   Platelets 194 150 - 400 K/uL   Neutrophils Relative % 37 %   Neutro Abs 1.9 1.7 - 7.7 K/uL   Lymphocytes Relative 53 %   Lymphs Abs 2.7 0.7 - 4.0 K/uL   Monocytes Relative 8 %   Monocytes Absolute 0.4 0.1 - 1.0 K/uL   Eosinophils Relative 3 %   Eosinophils Absolute 0.1 0.0 - 0.7  K/uL   Basophils Relative 1 %   Basophils Absolute 0.0 0.0 - 0.1 K/uL  Brain natriuretic peptide  Result Value Ref Range   B Natriuretic Peptide 1361.4 (H) 0.0 - 100.0 pg/mL  Troponin I  Result Value Ref Range   Troponin I 0.08 (H) <0.031 ng/mL   Imaging Review Dg Chest 2 View  03/10/2015  CLINICAL DATA:  Shortness of breath tonight. EXAM: CHEST  2 VIEW COMPARISON:  Radiographs 02/20/2014 FINDINGS: Lungs are hyperinflated, chronic. There are small bilateral pleural effusions. Ill-defined bibasilar opacities, favor atelectasis. Mild perihilar pulmonary edema, however improved from prior exam. Heart is normal in size, mediastinal contours are unchanged. No pneumothorax. No acute osseous abnormality. IMPRESSION: Small pleural effusions with mild pulmonary edema, question CHF. Ill-defined bibasilar opacities, favor atelectasis, aspiration or pneumonia not entirely excluded based on imaging findings alone. Electronically Signed   By: Rubye Oaks M.D.   On: 03/10/2015 01:22    I personally reviewed and evaluated these images and lab results as a part of my medical decision-making.   EKG Interpretation   Date/Time:  Tuesday March 10 2015 00:46:32 EST Ventricular Rate:  87 PR Interval:  167 QRS Duration: 84 QT Interval:  424 QTC Calculation: 510 R Axis:   86 Text Interpretation:  Sinus rhythm Borderline right axis deviation  Probable LVH with secondary repol abnrm Prolonged QT interval When  compared with ECG of 02/20/2014, No significant change was found Confirmed  by [facility]  MD, Fatumata Kashani ([id number]) on 03/10/2015 1:44:34 AM        MDM   Final diagnoses:  Acute on chronic congestive heart failure, unspecified congestive heart failure type (HCC)  Elevated troponin    Dyspnea with orthopnea worrisome for CHF although no peripheral edema or JVD. Old records are reviewed and he has previous admission for CHF. Last admission was  one year ago for alcoholic ketoacidosis. Patient does  admit to resuming alcohol consumption. Chest x-ray is consistent with congestive heart failure, and BNP is elevated. Troponin is mildly elevated of uncertain significance. Case is discussed with Dr. Julian Reil of [facility] who agrees to admit the patient under observation status.   I personally performed the services described in this documentation, which was scribed in my presence. The recorded information has been reviewed and is accurate.      [name], MD 03/10/15 [phone]

## 2015-03-10 NOTE — Progress Notes (Addendum)
[facility] PROGRESS NOTE Interim History: 59 year old with past medical history of heart failure with an ejection fraction of 25%, and mild pulmonary hypertension of 44 mmHg and the past medical history of alcohol abuse comes into the ED for shortness of breath for 3 days orthopnea nonproductive cough. He relates he has not been compliant with his medication due to alcohol abuse. Filed Weights   03/10/15 0042 03/10/15 0452  Weight: 78.019 kg (172 lb) 76.476 kg (168 lb 9.6 oz)        Intake/Output Summary (Last 24 hours) at 03/10/15 0802 Last data filed at 03/10/15 0756  Gross per 24 hour  Intake    440 ml  Output   2300 ml  Net  -1860 ml     Assessment/Plan: Acute on chronic systolic CHF (congestive heart failure) (HCC): - I agree with IV diuresis, started on heart failure pathway. - Strict I's and O's daily weights supplement potassium. - Continue Coreg add Aldactone. - He relates his dyspnea has improved denies any chest pain. First set of troponins was barely elevated at this was likely due to acute decompensated systolic heart failure.  Elevated troponins: In the setting of acute decompensated heart failure likely demand ischemia.  Type 2 diabetes mellitus with complication (HCC): - D/c metformin, agree with sliding scale insulin - Increase Lantus. A1c pending.  ETOH abuse: - Cont thiamine and folate. - monitor with CIWA.   Code Status: full Family Communication: none  Disposition Plan: home in 4 -5 days   Consultants:  none  Procedures: ECHO: His echo 2015 showed an EF 25%  Antibiotics:  none  HPI/Subjective: He relates this is better, he is still mildly flat to sleep. His shortness of breath is improved.  Objective: Filed Vitals:   03/10/15 0452 03/10/15 0525 03/10/15 0710 03/10/15 0714  BP: 169/84  151/94 143/86  Pulse: 84     Temp: 97.8 F (36.6 C)     TempSrc: Oral     Resp: 22     Height:  (1.905 m)     Weight: 76.476 kg  (168 lb 9.6 oz)     SpO2: 97% 96%       Exam:  General: Alert, awake, oriented x3, in no acute distress.  HEENT: No bruits, no goiter.  Heart: Regular rate and rhythm, _JVD Lungs: Good air movement, crackles at bases with decreased sounds of the right lower lobe Abdomen: Soft, nontender, nondistended, positive bowel sounds.  Neuro: Grossly intact, nonfocal.   Data Reviewed: Basic Metabolic Panel:  Recent Labs Lab 03/10/15 0150  NA 135  K 3.4*  CL 104  CO2 22  GLUCOSE 440*  BUN 7  CREATININE 1.00  CALCIUM 8.1*   Liver Function Tests: No results for input(s): AST, ALT, ALKPHOS, BILITOT, PROT, ALBUMIN in the last 168 hours. No results for input(s): LIPASE, AMYLASE in the last 168 hours. No results for input(s): AMMONIA in the last 168 hours. CBC:  Recent Labs Lab 03/10/15 0150  WBC 5.2  NEUTROABS 1.9  HGB 13.8  HCT 40.0  MCV 88.5  PLT 194   Cardiac Enzymes:  Recent Labs Lab 03/10/15 0150  TROPONINI 0.08*   BNP (last 3 results)  Recent Labs  03/10/15 0150  BNP 1361.4*    ProBNP (last 3 results) No results for input(s): PROBNP in the last 8760 hours.  CBG:  Recent Labs Lab 03/10/15 0437 03/10/15 0644  GLUCAP 574* 279*    No results found for this or any previous  visit (from the past 240 hour(s)).   Studies: Dg Chest 2 View  03/10/2015  CLINICAL DATA:  Shortness of breath tonight. EXAM: CHEST  2 VIEW COMPARISON:  Radiographs 02/20/2014 FINDINGS: Lungs are hyperinflated, chronic. There are small bilateral pleural effusions. Ill-defined bibasilar opacities, favor atelectasis. Mild perihilar pulmonary edema, however improved from prior exam. Heart is normal in size, mediastinal contours are unchanged. No pneumothorax. No acute osseous abnormality. IMPRESSION: Small pleural effusions with mild pulmonary edema, question CHF. Ill-defined bibasilar opacities, favor atelectasis, aspiration or pneumonia not entirely excluded based on imaging findings  alone. Electronically Signed   By: Rubye Oaks M.D.   On: 03/10/2015 01:22    Scheduled Meds: . aspirin EC  81 mg Oral Daily  . buPROPion  150 mg Oral Daily  . carvedilol  6.25 mg Oral BID WC  . enoxaparin (LOVENOX) injection  40 mg Subcutaneous Daily  . feeding supplement (GLUCERNA SHAKE)  237 mL Oral TID BM  . folic acid  1 mg Oral Daily  . furosemide  40 mg Intravenous Q12H  . [START ON 03/11/2015] Influenza vac split quadrivalent PF  0.5 mL Intramuscular Tomorrow-1000  . insulin aspart  0-15 Units Subcutaneous TID WC  . insulin glargine  5 Units Subcutaneous QHS  . lisinopril  20 mg Oral Daily  . metFORMIN  500 mg Oral BID WC  . multivitamin with minerals  1 tablet Oral Daily  . potassium chloride SA  20 mEq Oral Daily  . pravastatin  10 mg Oral q1800  . sertraline  100 mg Oral QHS  . sodium chloride  3 mL Intravenous Q12H  . spironolactone  12.5 mg Oral Daily  . thiamine  100 mg Oral Daily  . traZODone  100 mg Oral QHS   Continuous Infusions:    Marinda Elk  [facility] Pager [phone] If 7PM-7AM, please contact night-coverage at [email or web address], password [facility] 03/10/2015, 8:02 AM

## 2015-03-10 NOTE — Progress Notes (Signed)
Pt cbg 526, pt asymptomatic.  MD notified, new order placed.  Will carry out MD orders and continue to monitor.

## 2015-03-10 NOTE — ED Notes (Signed)
Pt comes from home via [facility], since yesterday c/o increased SOB, rhonchi in all lobes, stats on room air was 88% placed on 5L and came to 94%. PTA received 5 albuterol, 125 solumedrol.

## 2015-03-10 NOTE — Care Management Note (Signed)
Case Management Note  Patient Details  Name: [name] MRN: [id number] Date of Birth: 13-Oct-1955  Subjective/Objective:            Admitted with CHF        Action/Plan: Patient lives with his mother (39+ years old) and helps her around the home. Patient has private insurance with Medicare and the [place]. Patient served in [employer] for 18 yrs and goes to the [place] in [place] for medical treatment. No problem getting his medication. He stated that he has been dealing with a lot of stressful situations with his family and dealing with the loss of his wife 5 yrs ago. Patient stated that he eats out a lot. CM educated patient on monitoring his sodium intake and to weigh himself every day. Patient states that he walks 3 - 4 miles every day ( because his car needs to be repaired). CM also talked to patient about his alcohol consumption. Patient stated that he is working on this.  Expected Discharge Date:  03/13/2015             Expected Discharge Plan:  Home/Self Care  Discharge planning Services  CM Consult  Post Acute Care Choice:    Choice offered to:     DME Arranged:    DME   Status of Service:  In process, will continue to follow  Reola Mosher [phone] 03/10/2015, 2:13 PM

## 2015-03-10 NOTE — ED Notes (Signed)
Patient was offered food and drink... Patient and ate and drink with no complaints or problems.Marland Kitchen

## 2015-03-10 NOTE — Care Management Obs Status (Signed)
MEDICARE OBSERVATION STATUS NOTIFICATION   Patient Details  Name: [name] MRN: [id number] Date of Birth: Apr 17, 1956   Medicare Observation Status Notification Given:       Cherrie Distance, RN 03/10/2015, 2:20 PM

## 2015-03-10 NOTE — H&P (Signed)
[facility] History and Physical  DANDY BREVIG [id number]:[id number] DOB: October 10, 1955 DOA: 03/10/2015  Referring physician: EDP PCP: PROVIDER NOT IN SYSTEM   Chief Complaint: SOB   HPI: [name] is a 59 y.o. male with h/o CHF, EtOH abuse that is ongoing.  Patient presents to the ED with c/o moderate SOB onset 3 days ago.  Worse when lying flat.  Significantly worsened tonight.  Associated with non-productive cough.  No fever, chills, CP, N/V.  Review of Systems: Systems reviewed.  As above, otherwise negative  Past Medical History  Diagnosis Date  . Diabetes mellitus   . Hypertension   . Coronary artery disease   . MI (myocardial infarction) (HCC) 2    age 67 per patient  . Mental disorder   . Depression    Past Surgical History  Procedure Laterality Date  . Angioplasty  1984    at age 55  . Left and right heart catheterization with coronary/graft angiogram  08/13/2012    Procedure: LEFT AND RIGHT HEART CATHETERIZATION WITH [name];  Surgeon: Kathleene Hazel, MD;  Location: [facility] CATH LAB;  Service: Cardiovascular;;   Social History:  reports that he has been smoking Cigarettes.  He has been smoking about 0.50 packs per day. He has never used smokeless tobacco. He reports that he drinks about 1.2 oz of alcohol per week. He reports that he uses illicit drugs (Cocaine and Marijuana) about 7 times per week.  No Known Allergies  Family History  Problem Relation Age of Onset  . Heart attack Father     >60s  . Hypertension Father   . Diabetes Father   . Heart attack Sister   . Hypertension Mother      Prior to Admission medications   Medication Sig Start Date End Date Taking? Authorizing Provider  aspirin 81 MG tablet Take 81 mg by mouth daily.   Yes Historical Provider, MD  buPROPion (WELLBUTRIN XL) 150 MG 24 hr tablet Take 150 mg by mouth daily.   Yes Historical Provider, MD  carvedilol (COREG) 6.25 MG tablet Take 1 tablet (6.25 mg  total) by mouth 2 (two) times daily with a meal. 02/27/14  Yes Christina P Rama, MD  feeding supplement, GLUCERNA SHAKE, (GLUCERNA SHAKE) LIQD Take 237 mLs by mouth 3 (three) times daily between meals. 02/27/14  Yes Maryruth Bun Rama, MD  folic acid (FOLVITE) 1 MG tablet Take 1 tablet (1 mg total) by mouth daily. 02/27/14  Yes Christina P Rama, MD  furosemide (LASIX) 40 MG tablet Take 1 tablet (40 mg total) by mouth daily. 02/27/14  Yes Maryruth Bun Rama, MD  HYDROcodone-acetaminophen (NORCO/VICODIN) 5-325 MG per tablet Take 1-2 tablets by mouth every 6 hours as needed for pain. 02/27/14  Yes Christina P Rama, MD  insulin glargine (LANTUS) 100 UNIT/ML injection Inject 0.05 mLs (5 Units total) into the skin at bedtime. For high blood sugar control Patient taking differently: Inject 5 Units into the skin daily as needed (high blood sugar). For high blood sugar control 02/27/14  Yes Christina P Rama, MD  lisinopril (PRINIVIL,ZESTRIL) 20 MG tablet Take 20 mg by mouth daily.   Yes Historical Provider, MD  metFORMIN (GLUCOPHAGE) 1000 MG tablet Take 0.5 tablets (500 mg total) by mouth 2 (two) times daily with a meal. For high blood sugar control 12/27/12  Yes Sanjuana Kava, NP  Multiple Vitamin (MULTIVITAMIN WITH MINERALS) TABS tablet Take 1 tablet by mouth daily. 02/27/14  Yes Maryruth Bun Rama, MD  naproxen (NAPROSYN) 500 MG tablet Take 1 tablet (500 mg total) by mouth 2 (two) times daily. Patient taking differently: Take 500 mg by mouth 2 (two) times daily as needed for mild pain.  12/28/13  Yes Fayrene Helper, PA-C  potassium chloride 20 MEQ TBCR Take 20 mEq by mouth daily. For low potassium supplement 02/27/14  Yes Christina P Rama, MD  pravastatin (PRAVACHOL) 10 MG tablet Take 1 tablet (10 mg total) by mouth daily. For high cholesterol control 12/27/12  Yes Sanjuana Kava, NP  sertraline (ZOLOFT) 100 MG tablet Take 1 tablet (100 mg total) by mouth at bedtime. For depression 12/27/12  Yes Sanjuana Kava, NP  thiamine 100 MG  tablet Take 1 tablet (100 mg total) by mouth daily. 02/27/14  Yes Maryruth Bun Rama, MD  traZODone (DESYREL) 100 MG tablet Take 1 tablet (100 mg total) by mouth at bedtime. For sleep 12/27/12  Yes Sanjuana Kava, NP  diazepam (VALIUM) 5 MG tablet Take 1 tablet (5 mg total) by mouth every 8 (eight) hours. Patient not taking: Reported on 03/10/2015 02/27/14   Maryruth Bun Rama, MD  dronabinol (MARINOL) 2.5 MG capsule Take 1 capsule (2.5 mg total) by mouth 2 (two) times daily before lunch and supper. Patient not taking: Reported on 03/10/2015 02/27/14   Maryruth Bun Rama, MD   Physical Exam: Filed Vitals:   03/10/15 0415  BP:   Pulse: 89  Resp: 26    BP 142/78 mmHg  Pulse 89  Resp 26  Ht  (1.905 m)  Wt 78.019 kg (172 lb)  BMI 21.50 kg/m2  SpO2 93%  General Appearance:    Alert, oriented, no distress, appears stated age  Head:    Normocephalic, atraumatic  Eyes:    PERRL, EOMI, sclera non-icteric        Nose:   Nares without drainage or epistaxis. Mucosa, turbinates normal  Throat:   Moist mucous membranes. Oropharynx without erythema or exudate.  Neck:   Supple. No carotid bruits.  No thyromegaly.  No lymphadenopathy.   Back:     No CVA tenderness, no spinal tenderness  Lungs:     Clear to auscultation bilaterally, without wheezes, rhonchi or rales  Chest wall:    No tenderness to palpitation  Heart:    Regular rate and rhythm without murmurs, gallops, rubs  Abdomen:     Soft, non-tender, nondistended, normal bowel sounds, no organomegaly  Genitalia:    deferred  Rectal:    deferred  Extremities:   No clubbing, cyanosis or edema.  Pulses:   2+ and symmetric all extremities  Skin:   Skin color, texture, turgor normal, no rashes or lesions  Lymph nodes:   Cervical, supraclavicular, and axillary nodes normal  Neurologic:   CNII-XII intact. Normal strength, sensation and reflexes      throughout    Labs on Admission:  Basic Metabolic Panel:  Recent Labs Lab 03/10/15 0150  NA  135  K 3.4*  CL 104  CO2 22  GLUCOSE 440*  BUN 7  CREATININE 1.00  CALCIUM 8.1*   Liver Function Tests: No results for input(s): AST, ALT, ALKPHOS, BILITOT, PROT, ALBUMIN in the last 168 hours. No results for input(s): LIPASE, AMYLASE in the last 168 hours. No results for input(s): AMMONIA in the last 168 hours. CBC:  Recent Labs Lab 03/10/15 0150  WBC 5.2  NEUTROABS 1.9  HGB 13.8  HCT 40.0  MCV 88.5  PLT 194   Cardiac Enzymes:  Recent  Labs Lab 03/10/15 0150  TROPONINI 0.08*    BNP (last 3 results) No results for input(s): PROBNP in the last 8760 hours. CBG: No results for input(s): GLUCAP in the last 168 hours.  Radiological Exams on Admission: Dg Chest 2 View  03/10/2015  CLINICAL DATA:  Shortness of breath tonight. EXAM: CHEST  2 VIEW COMPARISON:  Radiographs 02/20/2014 FINDINGS: Lungs are hyperinflated, chronic. There are small bilateral pleural effusions. Ill-defined bibasilar opacities, favor atelectasis. Mild perihilar pulmonary edema, however improved from prior exam. Heart is normal in size, mediastinal contours are unchanged. No pneumothorax. No acute osseous abnormality. IMPRESSION: Small pleural effusions with mild pulmonary edema, question CHF. Ill-defined bibasilar opacities, favor atelectasis, aspiration or pneumonia not entirely excluded based on imaging findings alone. Electronically Signed   By: Rubye Oaks M.D.   On: 03/10/2015 01:22    EKG: Independently reviewed.  Assessment/Plan Active Problems:   Diabetes mellitus, type II (HCC)   Alcohol dependence (HCC)   Acute on chronic systolic CHF (congestive heart failure) (HCC)   Type 2 diabetes mellitus with complication (HCC)   1. Acute on chronic systolic CHF - 1. Lasix  IV daily, got 40 in ED and already has large amount of UOP 2. CHF pathway 2. EtOH dependence - CIWA 3. DM2 - continue metformin, lantus, add mod dose SSI AC/HS, giving 10 units insulin now for BGL of over 400 in  ED    Code Status: Full Code  Family Communication: No family in room Disposition Plan: Admit to obs   Time spent: 50 min  Sheneika Walstad M. [facility] Pager [phone]  If 7AM-7PM, please contact the day team taking care of the patient [email or web address] Password [facility] 03/10/2015, 4:34 AM

## 2015-03-10 NOTE — Progress Notes (Addendum)
Inpatient Diabetes Program Recommendations  AACE/ADA: New Consensus Statement on Inpatient Glycemic Control (2015)  Target Ranges:  Prepandial:   less than 140 mg/dL      Peak postprandial:   less than 180 mg/dL (1-2 hours)      Critically ill patients:  140 - 180 mg/dL   Review of Glycemic Control  Results for OJANI, FARNAM (MRN [id number]) as of 03/10/2015 11:25  Ref. Range 03/10/2015 04:37 03/10/2015 06:44  Glucose-Capillary Latest Ref Range: 65-99 mg/dL 683 (HH) 419 (H)   Diabetes history: Type 2, A1C pending  Outpatient Diabetes medications: Lantus 5 units qhs, Metformin 500mg  bid Current orders for Inpatient glycemic control: Lantus 5 units qhs, Novolog 0-5 unit qhs, Novolog 0-15 units tid, Novolog 3 units tid  Inpatient Diabetes Program Recommendations: Agree with current medication orders. Will follow.   Susette Racer, RN, BA, MHA, CDE Diabetes Coordinator Inpatient Diabetes Program  [phone] (Team Pager) [phone] [facility] Office) 03/10/2015 11:32 AM

## 2015-03-11 DIAGNOSIS — F1010 Alcohol abuse, uncomplicated: Secondary | ICD-10-CM

## 2015-03-11 DIAGNOSIS — I5023 Acute on chronic systolic (congestive) heart failure: Secondary | ICD-10-CM

## 2015-03-11 LAB — BASIC METABOLIC PANEL
ANION GAP: 8 (ref 5–15)
BUN: 16 mg/dL (ref 6–20)
CHLORIDE: 104 mmol/L (ref 101–111)
CO2: 24 mmol/L (ref 22–32)
Calcium: 8.6 mg/dL — ABNORMAL LOW (ref 8.9–10.3)
Creatinine, Ser: 1.13 mg/dL (ref 0.61–1.24)
GFR calc Af Amer: >60 mL/min (ref >60)
GFR calc non Af Amer: >60 mL/min (ref >60)
GLUCOSE: 249 mg/dL — AB (ref 65–99)
POTASSIUM: 3.6 mmol/L (ref 3.5–5.1)
SODIUM: 136 mmol/L (ref 135–145)

## 2015-03-11 LAB — GLUCOSE, CAPILLARY
GLUCOSE-CAPILLARY: 277 mg/dL — AB (ref 65–99)
Glucose-Capillary: 197 mg/dL — ABNORMAL HIGH (ref 65–99)
Glucose-Capillary: 227 mg/dL — ABNORMAL HIGH (ref 65–99)
Glucose-Capillary: 226 mg/dL — ABNORMAL HIGH (ref 65–99)

## 2015-03-11 LAB — HEMOGLOBIN A1C
Hgb A1c MFr Bld: 10.5 % — ABNORMAL HIGH (ref 4.8–5.6)
Mean Plasma Glucose: 255 mg/dL

## 2015-03-11 LAB — TROPONIN I: TROPONIN I: 0.06 ng/mL — AB (ref <0.031)

## 2015-03-11 MED ORDER — INSULIN GLARGINE 100 UNIT/ML ~~LOC~~ SOLN
22.0000 [IU] | Freq: Two times a day (BID) | SUBCUTANEOUS | Status: DC
  Administered 2015-03-11 – 2015-03-12 (×2): 22 [IU] via SUBCUTANEOUS
  Filled 2015-03-11 (×3): qty 0.22

## 2015-03-11 MED ORDER — INSULIN ASPART 100 UNIT/ML ~~LOC~~ SOLN
5.0000 [IU] | Freq: Three times a day (TID) | SUBCUTANEOUS | Status: DC
  Administered 2015-03-11 – 2015-03-12 (×3): 5 [IU] via SUBCUTANEOUS

## 2015-03-11 NOTE — Progress Notes (Signed)
I stopped in to review HF recommendations with [name].  He requested that I come back another time as he was trying to rest.  I will plan to return tomorrow.

## 2015-03-11 NOTE — Progress Notes (Addendum)
[facility] Progress Note   [name]  GHW:[id number]  DOB: 01-14-1956  DOA: 03/10/2015 PCP: PROVIDER NOT IN SYSTEM  Brief narrative: [name] is a 59 y.o. male with chronic systolic heart failure who has been noncompliant with his medications and presents to the hospital for orthopnea and cough for 3 days.   Subjective: Feels that his breathing is improving but he is still unable to lay flat. Does not have a cough or chest pain.  Assessment/Plan: Principal Problem:   Acute on chronic systolic CHF (congestive heart failure) -2-D echo reveals an EF of 30-35% and grade 3 diastolic dysfunction -Continue current dose of furosemide, Aldactone, carvedilol and lisinopril -DC naproxen  Active Problems: Mild troponin elevation -Decreased secondary to heart failure-will recheck to ensure that it is improving    Diabetes mellitus, type II  --Continue Lantus and NovoLog- increase dosages as he is still significantly hyperglycemic - A1c 10.5    ETOH abuse -He admits to drinking 2 40 ounces of beer a day-no signs of withdrawal  Depression/anxiety -Continue Zoloft, Wellbutrin and trazodone  Noncompliance with medications -I have discussed the importance of taking his medications appropriately-he appears to understand    Code Status:     Code Status Orders        Start     Ordered   03/10/15 0433  Full code   Continuous     03/10/15 0433     Family Communication: Disposition Plan: Home when stable DVT prophylaxis: Lovenox Consultants: Procedures:  Antibiotics: Anti-infectives    None      Objective: Filed Weights   03/10/15 0042 03/10/15 0452 03/11/15 0449  Weight: 78.019 kg (172 lb) 76.476 kg (168 lb 9.6 oz) 76.93 kg (169 lb 9.6 oz)    Intake/Output Summary (Last 24 hours) at 03/11/15 1523 Last data filed at 03/11/15 1211  Gross per 24 hour  Intake    800 ml  Output   1400 ml  Net   -600 ml     Vitals Filed Vitals:   03/10/15  2133 03/11/15 0449 03/11/15 0800 03/11/15 1210  BP: 131/85 111/85 114/77 127/78  Pulse: 76 73 68 72  Temp: 97.7 F (36.5 C) 98 F (36.7 C) 97.6 F (36.4 C) 97.9 F (36.6 C)  TempSrc: Oral Oral Oral Oral  Resp: 20 18 16 16   Height:      Weight:  76.93 kg (169 lb 9.6 oz)    SpO2: 98% 98% 98% 99%    Exam:  General:  Pt is alert, not in acute distress  HEENT: No icterus, No thrush, oral mucosa moist  Cardiovascular: regular rate and rhythm, S1/S2 No murmur  Respiratory: Crackles at bilateral bases   Abdomen: Soft, +Bowel sounds, non tender, non distended, no guarding  MSK: No LE edema, cyanosis or clubbing  Data Reviewed: Basic Metabolic Panel:  Recent Labs Lab 03/10/15 0150 03/10/15 0846 03/11/15 0318  NA 135 136 136  K 3.4* 3.4* 3.6  CL 104 104 104  CO2 22 19* 24  GLUCOSE 440* 306* 249*  BUN 7 8 16   CREATININE 1.00 1.10 1.13  CALCIUM 8.1* 8.4* 8.6*   Liver Function Tests: No results for input(s): AST, ALT, ALKPHOS, BILITOT, PROT, ALBUMIN in the last 168 hours. No results for input(s): LIPASE, AMYLASE in the last 168 hours. No results for input(s): AMMONIA in the last 168 hours. CBC:  Recent Labs Lab 03/10/15 0150  WBC 5.2  NEUTROABS 1.9  HGB 13.8  HCT 40.0  MCV 88.5  PLT 194   Cardiac Enzymes:  Recent Labs Lab 03/10/15 0150  TROPONINI 0.08*   BNP (last 3 results)  Recent Labs  03/10/15 0150  BNP 1361.4*    ProBNP (last 3 results) No results for input(s): PROBNP in the last 8760 hours.  CBG:  Recent Labs Lab 03/10/15 1147 03/10/15 1655 03/10/15 2136 03/11/15 0630 03/11/15 1146  GLUCAP 526* 348* 201* 197* 227*    No results found for this or any previous visit (from the past 240 hour(s)).   Studies: Dg Chest 2 View  03/10/2015  CLINICAL DATA:  Shortness of breath tonight. EXAM: CHEST  2 VIEW COMPARISON:  Radiographs 02/20/2014 FINDINGS: Lungs are hyperinflated, chronic. There are small bilateral pleural effusions.  Ill-defined bibasilar opacities, favor atelectasis. Mild perihilar pulmonary edema, however improved from prior exam. Heart is normal in size, mediastinal contours are unchanged. No pneumothorax. No acute osseous abnormality. IMPRESSION: Small pleural effusions with mild pulmonary edema, question CHF. Ill-defined bibasilar opacities, favor atelectasis, aspiration or pneumonia not entirely excluded based on imaging findings alone. Electronically Signed   By: Rubye Oaks M.D.   On: 03/10/2015 01:22    Scheduled Meds:  Scheduled Meds: . aspirin EC  81 mg Oral Daily  . buPROPion  150 mg Oral Daily  . carvedilol  6.25 mg Oral BID WC  . enoxaparin (LOVENOX) injection  40 mg Subcutaneous Daily  . feeding supplement (GLUCERNA SHAKE)  237 mL Oral Q24H  . folic acid  1 mg Oral Daily  . furosemide  40 mg Intravenous Q12H  . Influenza vac split quadrivalent PF  0.5 mL Intramuscular Tomorrow-1000  . insulin aspart  0-15 Units Subcutaneous TID WC  . insulin aspart  0-5 Units Subcutaneous QHS  . insulin aspart  3 Units Subcutaneous TID WC  . insulin glargine  20 Units Subcutaneous BID  . lisinopril  20 mg Oral Daily  . multivitamin with minerals  1 tablet Oral Daily  . potassium chloride SA  20 mEq Oral Daily  . pravastatin  10 mg Oral q1800  . sertraline  100 mg Oral QHS  . sodium chloride  3 mL Intravenous Q12H  . spironolactone  12.5 mg Oral Daily  . thiamine  100 mg Oral Daily  . traZODone  100 mg Oral QHS   Continuous Infusions:   Time spent on care of this patient: 5 min   Jackee Glasner, MD 03/11/2015, 3:23 PM  LOS: 1 day   [facility] Office  [phone] Pager - Text Page per [email or web address] If 7PM-7AM, please contact night-coverage [email or web address]

## 2015-03-12 DIAGNOSIS — R7989 Other specified abnormal findings of blood chemistry: Secondary | ICD-10-CM

## 2015-03-12 DIAGNOSIS — E118 Type 2 diabetes mellitus with unspecified complications: Secondary | ICD-10-CM

## 2015-03-12 LAB — BASIC METABOLIC PANEL
ANION GAP: 9 (ref 5–15)
BUN: 20 mg/dL (ref 6–20)
CALCIUM: 8.3 mg/dL — AB (ref 8.9–10.3)
CHLORIDE: 100 mmol/L — AB (ref 101–111)
CO2: 25 mmol/L (ref 22–32)
Creatinine, Ser: 1.08 mg/dL (ref 0.61–1.24)
GFR calc non Af Amer: >60 mL/min (ref >60)
Glucose, Bld: 136 mg/dL — ABNORMAL HIGH (ref 65–99)
Potassium: 4 mmol/L (ref 3.5–5.1)
Sodium: 134 mmol/L — ABNORMAL LOW (ref 135–145)

## 2015-03-12 LAB — GLUCOSE, CAPILLARY
GLUCOSE-CAPILLARY: 213 mg/dL — AB (ref 65–99)
Glucose-Capillary: 165 mg/dL — ABNORMAL HIGH (ref 65–99)

## 2015-03-12 MED ORDER — INSULIN ASPART 100 UNIT/ML ~~LOC~~ SOLN: 12.0000 [IU] | Freq: Three times a day (TID) | SUBCUTANEOUS | Status: DC

## 2015-03-12 MED ORDER — INSULIN GLARGINE 100 UNIT/ML ~~LOC~~ SOLN: 45.0000 [IU] | Freq: Every day | SUBCUTANEOUS | Status: DC

## 2015-03-12 MED ORDER — INSULIN GLARGINE 100 UNIT/ML SOLOSTAR PEN: 45.0000 [IU] | PEN_INJECTOR | Freq: Every morning | SUBCUTANEOUS | Status: DC

## 2015-03-12 MED ORDER — INSULIN ASPART 100 UNIT/ML FLEXPEN: 12.0000 [IU] | PEN_INJECTOR | Freq: Three times a day (TID) | SUBCUTANEOUS | Status: DC

## 2015-03-12 NOTE — Discharge Summary (Signed)
Pt got discharged, discharge instructions provided and patient showed understanding to it, IV taken out,Telemonitor DC,pt left unit in wheelchair with all of the belongings. 

## 2015-03-12 NOTE — Progress Notes (Signed)
Patient could benefit from a Disease Management for CHF, needs ongoing education with disease process. Patient is agreeable to [facility] services, HHC choice offered, patient chose [facility]. Mary with [facility] called for referral. Alexis Goodell [phone]

## 2015-03-12 NOTE — Discharge Summary (Signed)
Physician Discharge Summary  HADY BRINER EHO:[id number] DOB: 02/10/1956 DOA: 03/10/2015  PCP: [facility] IN SYSTEM [facility]  Admit date: 03/10/2015 Discharge date: 03/12/2015  Time spent: 60 minutes   Follow up: - home health RN for CHF education, monitoring for compliance  - needs Bmet in 1 wks  Discharge Condition: stable    Discharge Diagnoses:  Principal Problem:   Acute on chronic systolic CHF (congestive heart failure) (HCC) Active Problems:   Diabetes mellitus, type II (HCC)   ETOH abuse   Type 2 diabetes mellitus with complication (HCC)   Elevated troponin I level   History of present illness:  [name] is a 59 y.o. male with ETOH abuse, DM 2, depression/ anxiety and chronic systolic heart failure who has been noncompliant with his medications and presents to the hospital for orthopnea and cough for 3 days. He is found to have acute heart failure.   Hospital Course:  Principal Problem:  Acute on chronic systolic CHF (congestive heart failure) -2-D echo reveals an EF of 30-35% and grade 3 diastolic dysfunction -Continue furosemide,  carvedilol and lisinopril -DC naproxen - dry weight 77 kg- oxygen level 94 % on room air while ambulating- he did become dizzy with ambulation- orthostatic vitals negative  Active Problems:  Mild troponin elevation -Decreased secondary to heart failure- - no rise in troponin trend noted    Diabetes mellitus, type II - uncontrolled --Continue Lantus and NovoLog- he was not taking Lantus regularly at home and was on 5 U "PRN" for high glucose- he was not using Novolog- at this time he is requiring about 45 U of Lantus and 12 U of Novolog with meals - A1c 10.5 - have prescribed both pens and vials - if he is not able to afford pens, he knows to get the vials instead   ETOH abuse -He admits to drinking 2 40 ounces of beer a day-no signs of withdrawal  Depression/anxiety -Continue Zoloft, Wellbutrin and  trazodone  Noncompliance with medications -I have discussed the importance of taking his medications appropriately-he appears to understand   Procedures: 2 D ECHO Left ventricle: The cavity size was normal. There was mild hypertrophy of the posterior wall. Systolic function was normal. The estimated ejection fraction was in the range of 30% to 35%. Diffuse hypokinesis. Doppler parameters are consistent with a reversible restrictive pattern, indicative of decreased left ventricular diastolic compliance and/or increased left atrial pressure (grade 3 diastolic dysfunction). Doppler parameters are consistent with high ventricular filling pressure. - Mitral valve: There was mild regurgitation. - Left atrium: The atrium was mildly dilated. - Right ventricle: The cavity size was normal. Wall thickness was normal. Systolic function was normal. RV systolic pressure (S, est): 39 mm Hg. - Tricuspid valve: There was trivial regurgitation. - Pulmonary arteries: PA peak pressure: 39 mm Hg (S). - Inferior vena cava: The vessel was normal in size. The respirophasic diameter changes were in the normal range (>= 50%), consistent with normal central venous pressure. - Pericardium, extracardiac: There was a moderate-sized left pleural effusion.  Consultations:  none  Discharge Exam: Filed Weights   03/10/15 0452 03/11/15 0449 03/12/15 0503  Weight: 76.476 kg (168 lb 9.6 oz) 76.93 kg (169 lb 9.6 oz) 77.474 kg (170 lb 12.8 oz)   Filed Vitals:   03/12/15 1304  BP: 110/68  Pulse: 62  Temp: 97.5 F (36.4 C)  Resp: 18    General: AAO x 3, no distress Cardiovascular: RRR, no murmurs  Respiratory: clear to auscultation bilaterally GI: soft, non-tender, non-distended, bowel sound positive  Discharge Instructions You were cared for by a hospitalist during your hospital stay. If you have any questions about your discharge medications or the care you received while you  were in the hospital after you are discharged, you can call the unit and asked to speak with the hospitalist on call if the hospitalist that took care of you is not available. Once you are discharged, your primary care physician will handle any further medical issues. Please note that NO REFILLS for any discharge medications will be authorized once you are discharged, as it is imperative that you return to your primary care physician (or establish a relationship with a primary care physician if you do not have one) for your aftercare needs so that they can reassess your need for medications and monitor your lab values.      Discharge Instructions    (HEART FAILURE PATIENTS) Call MD:  Anytime you have any of the following symptoms: 1) 3 pound weight gain in 24 hours or 5 pounds in 1 week 2) shortness of breath, with or without a dry hacking cough 3) swelling in the hands, feet or stomach 4) if you have to sleep on extra pillows at night in order to breathe.    Complete by:  As directed      Discharge instructions    Complete by:  As directed   Diabetic, low salt, low fat diet     Increase activity slowly    Complete by:  As directed             Medication List    STOP taking these medications        HYDROcodone-acetaminophen 5-325 MG tablet  Commonly known as:  NORCO/VICODIN     naproxen 500 MG tablet  Commonly known as:  NAPROSYN      TAKE these medications        aspirin 81 MG tablet  Take 81 mg by mouth daily.     buPROPion 150 MG 24 hr tablet  Commonly known as:  WELLBUTRIN XL  Take 150 mg by mouth daily.     carvedilol 6.25 MG tablet  Commonly known as:  COREG  Take 1 tablet (6.25 mg total) by mouth 2 (two) times daily with a meal.     feeding supplement (GLUCERNA SHAKE) Liqd  Take 237 mLs by mouth 3 (three) times daily between meals.     folic acid 1 MG tablet  Commonly known as:  FOLVITE  Take 1 tablet (1 mg total) by mouth daily.     furosemide 40 MG tablet   Commonly known as:  LASIX  Take 1 tablet (40 mg total) by mouth daily.     insulin aspart 100 UNIT/ML FlexPen  Commonly known as:  NOVOLOG FLEXPEN  Inject 12 Units into the skin 3 (three) times daily with meals.     insulin aspart 100 UNIT/ML injection  Commonly known as:  novoLOG  Inject 12 Units into the skin 3 (three) times daily with meals.     insulin glargine 100 UNIT/ML injection  Commonly known as:  LANTUS  Inject 0.45 mLs (45 Units total) into the skin daily. For high blood sugar control     Insulin Glargine 100 UNIT/ML Solostar Pen  Commonly known as:  LANTUS SOLOSTAR  Inject 45 Units into the skin every morning.  Start taking on:  03/13/2015     lisinopril 20 MG tablet  Commonly known as:  PRINIVIL,ZESTRIL  Take 20 mg by mouth daily.     metFORMIN 1000 MG tablet  Commonly known as:  GLUCOPHAGE  Take 0.5 tablets (500 mg total) by mouth 2 (two) times daily with a meal. For high blood sugar control     multivitamin with minerals Tabs tablet  Take 1 tablet by mouth daily.     Potassium Chloride ER 20 MEQ Tbcr  Take 20 mEq by mouth daily. For low potassium supplement     pravastatin 10 MG tablet  Commonly known as:  PRAVACHOL  Take 1 tablet (10 mg total) by mouth daily. For high cholesterol control     sertraline 100 MG tablet  Commonly known as:  ZOLOFT  Take 1 tablet (100 mg total) by mouth at bedtime. For depression     thiamine 100 MG tablet  Take 1 tablet (100 mg total) by mouth daily.     traZODone 100 MG tablet  Commonly known as:  DESYREL  Take 1 tablet (100 mg total) by mouth at bedtime. For sleep       No Known Allergies    The results of significant diagnostics from this hospitalization (including imaging, microbiology, ancillary and laboratory) are listed below for reference.    Significant Diagnostic Studies: Dg Chest 2 View  03/10/2015  CLINICAL DATA:  Shortness of breath tonight. EXAM: CHEST  2 VIEW COMPARISON:  Radiographs 02/20/2014  FINDINGS: Lungs are hyperinflated, chronic. There are small bilateral pleural effusions. Ill-defined bibasilar opacities, favor atelectasis. Mild perihilar pulmonary edema, however improved from prior exam. Heart is normal in size, mediastinal contours are unchanged. No pneumothorax. No acute osseous abnormality. IMPRESSION: Small pleural effusions with mild pulmonary edema, question CHF. Ill-defined bibasilar opacities, favor atelectasis, aspiration or pneumonia not entirely excluded based on imaging findings alone. Electronically Signed   By: Rubye Oaks M.D.   On: 03/10/2015 01:22    Microbiology: No results found for this or any previous visit (from the past 240 hour(s)).   Labs: Basic Metabolic Panel:  Recent Labs Lab 03/10/15 0150 03/10/15 0846 03/11/15 0318 03/12/15 0447  NA 135 136 136 134*  K 3.4* 3.4* 3.6 4.0  CL 104 104 104 100*  CO2 22 19* 24 25  GLUCOSE 440* 306* 249* 136*  BUN CREATININE 1.00 1.10 1.13 1.08  CALCIUM 8.1* 8.4* 8.6* 8.3*   Liver Function Tests: No results for input(s): AST, ALT, ALKPHOS, BILITOT, PROT, ALBUMIN in the last 168 hours. No results for input(s): LIPASE, AMYLASE in the last 168 hours. No results for input(s): AMMONIA in the last 168 hours. CBC:  Recent Labs Lab 03/10/15 0150  WBC 5.2  NEUTROABS 1.9  HGB 13.8  HCT 40.0  MCV 88.5  PLT 194   Cardiac Enzymes:  Recent Labs Lab 03/10/15 0150 03/11/15 1633  TROPONINI 0.08* 0.06*   BNP: BNP (last 3 results)  Recent Labs  03/10/15 0150  BNP 1361.4*    ProBNP (last 3 results) No results for input(s): PROBNP in the last 8760 hours.  CBG:  Recent Labs Lab 03/11/15 1146 03/11/15 1617 03/11/15 2049 03/12/15 0633 03/12/15 1115  GLUCAP 227* 226* 277* 165* 213*       SignedCalvert Cantor, MD [facility] 03/12/2015, 3:30 PM

## 2015-03-12 NOTE — Progress Notes (Signed)
Heart Failure Navigator Consult Note  Presentation: [name] is a 59 y.o. male with h/o CHF, EtOH abuse that is ongoing. Patient presents to the ED with c/o moderate SOB onset 3 days ago. Worse when lying flat. Significantly worsened tonight. Associated with non-productive cough. No fever, chills, CP, N/V  Past Medical History  Diagnosis Date  . Diabetes mellitus   . Hypertension   . Coronary artery disease   . MI (myocardial infarction) (HCC) 62    age 26 per patient  . Mental disorder   . Depression     Social History   Social History  . Marital Status: Widowed    Spouse Name: N/A  . Number of Children: N/A  . Years of Education: N/A   Social History Main Topics  . Smoking status: Current Every Day Smoker -- 0.50 packs/day    Types: Cigarettes  . Smokeless tobacco: Never Used  . Alcohol Use: 1.2 oz/week    2 Cans of beer per week     Comment: drinking daily (2 40oz beer a day)  . Drug Use: 7.00 per week    Special: Cocaine, Marijuana     Comment: last use about 6 months ago  . Sexual Activity: Yes   Other Topics Concern  . None   Social History Narrative    ECHO:--03/10/15  LV EF: 30% -  35%  ------------------------------------------------------------------- Indications:   CHF - 428.0.  ------------------------------------------------------------------- History:  Risk factors: Elevated troponin. Alcohol abuse. Diabetes mellitus.  ------------------------------------------------------------------- Study Conclusions  - Left ventricle: The cavity size was normal. There was mild hypertrophy of the posterior wall. Systolic function was normal. The estimated ejection fraction was in the range of 30% to 35%. Diffuse hypokinesis. Doppler parameters are consistent with a reversible restrictive pattern, indicative of decreased left ventricular diastolic compliance and/or increased left atrial pressure (grade 3 diastolic  dysfunction). Doppler parameters are consistent with high ventricular filling pressure. - Mitral valve: There was mild regurgitation. - Left atrium: The atrium was mildly dilated. - Right ventricle: The cavity size was normal. Wall thickness was normal. Systolic function was normal. RV systolic pressure (S, est): 39 mm Hg. - Tricuspid valve: There was trivial regurgitation. - Pulmonary arteries: PA peak pressure: 39 mm Hg (S). - Inferior vena cava: The vessel was normal in size. The respirophasic diameter changes were in the normal range (>= 50%), consistent with normal central venous pressure. - Pericardium, extracardiac: There was a moderate-sized left pleural effusion.  Transthoracic echocardiography. M-mode, complete 2D, spectral Doppler, and color Doppler. Birthdate: Patient birthdate: Apr 06, 1956. Age: Patient is 59 yr old. Sex: Gender: male. BMI: 21 kg/m^2. Blood pressure:   127/73 Patient status: Inpatient. Study date: Study date: 03/10/2015. Study time: 05:00 PM. Location: Bedside.  BNP    Component Value Date/Time   BNP 1361.4* 03/10/2015 0150    ProBNP    Component Value Date/Time   PROBNP 5308.0* 02/24/2014 0340     Education Assessment and Provision:  Detailed education and instructions provided on heart failure disease management including the following:  Signs and symptoms of Heart Failure When to call the physician Importance of daily weights Low sodium diet Fluid restriction Medication management Anticipated future follow-up appointments  Patient education given on each of the above topics.  Patient acknowledges understanding and acceptance of all instructions.  I spoke briefly with [name] regarding his HF.  He has received education just before I have entered room.  He tells me that he has a scale at home  and we discussed the importance of daily weights and how they relate to signs and symptoms of HF.  I also reinforced  a low sodium diet and high sodium foods to avoid.  His girlfriend admits that he has not been taking his medications at home as prescribed-yet he denies issue with getting medications.  I encouraged him to take his medications.  He currently lives in [place] with his mother.  I will speak to Case Manager regarding a F/U appt as he says that he has PCP at the [place].  Education Materials:  "Living Better With Heart Failure" Booklet, Daily Weight Tracker Tool .   High Risk Criteria for Readmission and/or Poor Patient Outcomes:   EF <30%- 30-35% with grade 3 dias dys  2 or more admissions in 6 months-No  Difficult social situation- No  Demonstrates medication noncompliance- yes per girlfriend does not take medications as prescribed   Barriers of Care:  Poor Health Literacy, Poor Insight, Knowledge of HF and compliance  Discharge Planning:   Plans to return home with family (mother).  [facility] for ongoing education, compliance reinforcement and symptom recognition.

## 2015-03-12 NOTE — Progress Notes (Signed)
SATURATION QUALIFICATIONS: (This note is used to comply with regulatory documentation for home oxygen)  Patient Saturations on Room Air at Rest = 94%  Patient Saturations on Room Air while Ambulating = 94%  Patient Saturations on 2Liters of oxygen while Ambulating = 96%  Pt felt light headed and dizzy with short distance ambulation almost 8 ft, got back to bed, oxygen started via nasal canula @2l /min

## 2015-03-12 NOTE — Progress Notes (Signed)
Orthostatic Vitals for room 3E-30 done,  Informed to MD.

## 2015-03-16 ENCOUNTER — Telehealth (HOSPITAL_COMMUNITY): Payer: Self-pay

## 2015-03-16 NOTE — Telephone Encounter (Signed)
Opened in error

## 2015-03-17 ENCOUNTER — Telehealth (HOSPITAL_COMMUNITY): Payer: Self-pay

## 2015-03-17 NOTE — Telephone Encounter (Signed)
Ms [name] from [facility] would like an order for PT twice a week for 4 weeks I don't see that he is or is going to be a patient at the [facility]. Do we need to get him a follow up appointment for HF clinic?

## 2015-03-27 ENCOUNTER — Telehealth (HOSPITAL_COMMUNITY): Payer: Self-pay

## 2015-03-27 NOTE — Telephone Encounter (Signed)
[facility] for [facility] called to report patient missed a visit on 03/26/15

## 2015-04-30 ENCOUNTER — Telehealth (HOSPITAL_COMMUNITY): Payer: Self-pay

## 2015-04-30 NOTE — Telephone Encounter (Signed)
VO given to RN Vickie per Dr. Gala Romney for patient to have [facility] skilled RN visits by [employer]

## 2015-05-26 ENCOUNTER — Telehealth (HOSPITAL_COMMUNITY): Payer: Self-pay

## 2015-05-26 NOTE — Telephone Encounter (Signed)
Patient had orders for home health initiated in November.  Needs plan of care signed.  Aggie Cosier is faxing to [place] plan of care to be signed [phone]).  Needs back 1/31

## 2015-05-28 ENCOUNTER — Telehealth (HOSPITAL_COMMUNITY): Payer: Self-pay

## 2015-05-28 NOTE — Telephone Encounter (Signed)
Joh RN from [facility] called and said that patient missed visit today because patient was not at home

## 2015-09-07 ENCOUNTER — Emergency Department (HOSPITAL_COMMUNITY): Payer: Medicare Other

## 2015-09-07 ENCOUNTER — Encounter (HOSPITAL_COMMUNITY): Payer: Self-pay

## 2015-09-07 ENCOUNTER — Inpatient Hospital Stay (HOSPITAL_COMMUNITY)
Admission: EM | Admit: 2015-09-07 | Discharge: 2015-09-16 | DRG: 292 | Disposition: A | Discharge status: Skilled Nursing Facility | Payer: Medicare Other | Attending: Internal Medicine | Admitting: Internal Medicine

## 2015-09-07 DIAGNOSIS — I252 Old myocardial infarction: Secondary | ICD-10-CM

## 2015-09-07 DIAGNOSIS — I493 Ventricular premature depolarization: Secondary | ICD-10-CM | POA: Diagnosis not present

## 2015-09-07 DIAGNOSIS — E1165 Type 2 diabetes mellitus with hyperglycemia: Secondary | ICD-10-CM | POA: Diagnosis present

## 2015-09-07 DIAGNOSIS — F17210 Nicotine dependence, cigarettes, uncomplicated: Secondary | ICD-10-CM | POA: Diagnosis present

## 2015-09-07 DIAGNOSIS — I959 Hypotension, unspecified: Secondary | ICD-10-CM | POA: Diagnosis not present

## 2015-09-07 DIAGNOSIS — R7989 Other specified abnormal findings of blood chemistry: Secondary | ICD-10-CM

## 2015-09-07 DIAGNOSIS — Z9114 Patient's other noncompliance with medication regimen: Secondary | ICD-10-CM | POA: Diagnosis not present

## 2015-09-07 DIAGNOSIS — I2510 Atherosclerotic heart disease of native coronary artery without angina pectoris: Secondary | ICD-10-CM | POA: Diagnosis present

## 2015-09-07 DIAGNOSIS — Z8249 Family history of ischemic heart disease and other diseases of the circulatory system: Secondary | ICD-10-CM | POA: Diagnosis not present

## 2015-09-07 DIAGNOSIS — F1410 Cocaine abuse, uncomplicated: Secondary | ICD-10-CM | POA: Diagnosis present

## 2015-09-07 DIAGNOSIS — R0902 Hypoxemia: Secondary | ICD-10-CM | POA: Diagnosis not present

## 2015-09-07 DIAGNOSIS — I429 Cardiomyopathy, unspecified: Secondary | ICD-10-CM | POA: Diagnosis present

## 2015-09-07 DIAGNOSIS — Z7189 Other specified counseling: Secondary | ICD-10-CM | POA: Insufficient documentation

## 2015-09-07 DIAGNOSIS — Z66 Do not resuscitate: Secondary | ICD-10-CM | POA: Diagnosis not present

## 2015-09-07 DIAGNOSIS — Z9119 Patient's noncompliance with other medical treatment and regimen: Secondary | ICD-10-CM

## 2015-09-07 DIAGNOSIS — I248 Other forms of acute ischemic heart disease: Secondary | ICD-10-CM | POA: Diagnosis present

## 2015-09-07 DIAGNOSIS — R945 Abnormal results of liver function studies: Secondary | ICD-10-CM | POA: Diagnosis not present

## 2015-09-07 DIAGNOSIS — B199 Unspecified viral hepatitis without hepatic coma: Secondary | ICD-10-CM | POA: Diagnosis present

## 2015-09-07 DIAGNOSIS — F1010 Alcohol abuse, uncomplicated: Secondary | ICD-10-CM | POA: Diagnosis present

## 2015-09-07 DIAGNOSIS — B1920 Unspecified viral hepatitis C without hepatic coma: Secondary | ICD-10-CM | POA: Diagnosis present

## 2015-09-07 DIAGNOSIS — J9811 Atelectasis: Secondary | ICD-10-CM | POA: Diagnosis not present

## 2015-09-07 DIAGNOSIS — F329 Major depressive disorder, single episode, unspecified: Secondary | ICD-10-CM | POA: Diagnosis present

## 2015-09-07 DIAGNOSIS — J948 Other specified pleural conditions: Secondary | ICD-10-CM | POA: Diagnosis not present

## 2015-09-07 DIAGNOSIS — I5043 Acute on chronic combined systolic (congestive) and diastolic (congestive) heart failure: Secondary | ICD-10-CM | POA: Diagnosis present

## 2015-09-07 DIAGNOSIS — I472 Ventricular tachycardia: Secondary | ICD-10-CM | POA: Diagnosis not present

## 2015-09-07 DIAGNOSIS — R0602 Shortness of breath: Secondary | ICD-10-CM | POA: Diagnosis not present

## 2015-09-07 DIAGNOSIS — F10220 Alcohol dependence with intoxication, uncomplicated: Secondary | ICD-10-CM | POA: Diagnosis present

## 2015-09-07 DIAGNOSIS — I509 Heart failure, unspecified: Secondary | ICD-10-CM | POA: Diagnosis not present

## 2015-09-07 DIAGNOSIS — J90 Pleural effusion, not elsewhere classified: Secondary | ICD-10-CM | POA: Diagnosis not present

## 2015-09-07 DIAGNOSIS — J9601 Acute respiratory failure with hypoxia: Secondary | ICD-10-CM | POA: Diagnosis not present

## 2015-09-07 DIAGNOSIS — R05 Cough: Secondary | ICD-10-CM

## 2015-09-07 DIAGNOSIS — Z515 Encounter for palliative care: Secondary | ICD-10-CM | POA: Insufficient documentation

## 2015-09-07 DIAGNOSIS — I10 Essential (primary) hypertension: Secondary | ICD-10-CM | POA: Diagnosis not present

## 2015-09-07 DIAGNOSIS — N179 Acute kidney failure, unspecified: Secondary | ICD-10-CM | POA: Diagnosis present

## 2015-09-07 DIAGNOSIS — Z833 Family history of diabetes mellitus: Secondary | ICD-10-CM

## 2015-09-07 DIAGNOSIS — F10231 Alcohol dependence with withdrawal delirium: Secondary | ICD-10-CM | POA: Diagnosis present

## 2015-09-07 DIAGNOSIS — E119 Type 2 diabetes mellitus without complications: Secondary | ICD-10-CM | POA: Diagnosis not present

## 2015-09-07 DIAGNOSIS — F1910 Other psychoactive substance abuse, uncomplicated: Secondary | ICD-10-CM | POA: Insufficient documentation

## 2015-09-07 DIAGNOSIS — I110 Hypertensive heart disease with heart failure: Principal | ICD-10-CM | POA: Diagnosis present

## 2015-09-07 DIAGNOSIS — Z794 Long term (current) use of insulin: Secondary | ICD-10-CM | POA: Diagnosis not present

## 2015-09-07 DIAGNOSIS — R059 Cough, unspecified: Secondary | ICD-10-CM

## 2015-09-07 DIAGNOSIS — I428 Other cardiomyopathies: Secondary | ICD-10-CM

## 2015-09-07 LAB — COMPREHENSIVE METABOLIC PANEL
ALBUMIN: 3.1 g/dL — AB (ref 3.5–5.0)
ALT: 42 U/L (ref 17–63)
AST: 55 U/L — AB (ref 15–41)
Alkaline Phosphatase: 85 U/L (ref 38–126)
Anion gap: 11 (ref 5–15)
BUN: 9 mg/dL (ref 6–20)
CHLORIDE: 102 mmol/L (ref 101–111)
CO2: 22 mmol/L (ref 22–32)
CREATININE: 0.91 mg/dL (ref 0.61–1.24)
Calcium: 8.8 mg/dL — ABNORMAL LOW (ref 8.9–10.3)
GFR calc Af Amer: >60 mL/min (ref >60)
GFR calc non Af Amer: >60 mL/min (ref >60)
GLUCOSE: 335 mg/dL — AB (ref 65–99)
POTASSIUM: 4 mmol/L (ref 3.5–5.1)
SODIUM: 135 mmol/L (ref 135–145)
Total Bilirubin: 1.4 mg/dL — ABNORMAL HIGH (ref 0.3–1.2)
Total Protein: 7 g/dL (ref 6.5–8.1)

## 2015-09-07 LAB — GLUCOSE, CAPILLARY
GLUCOSE-CAPILLARY: 305 mg/dL — AB (ref 65–99)
GLUCOSE-CAPILLARY: 118 mg/dL — AB (ref 65–99)
Glucose-Capillary: 81 mg/dL (ref 65–99)

## 2015-09-07 LAB — BRAIN NATRIURETIC PEPTIDE: B NATRIURETIC PEPTIDE 5: 1076.5 pg/mL — AB (ref 0.0–100.0)

## 2015-09-07 LAB — CBC WITH DIFFERENTIAL/PLATELET
BASOS ABS: 0 10*3/uL (ref 0.0–0.1)
BASOS PCT: 1 %
EOS PCT: 1 %
Eosinophils Absolute: 0.1 10*3/uL (ref 0.0–0.7)
HCT: 43.2 % (ref 39.0–52.0)
Hemoglobin: 15.2 g/dL (ref 13.0–17.0)
LYMPHS PCT: 37 %
Lymphs Abs: 2.3 10*3/uL (ref 0.7–4.0)
MCH: 30.2 pg (ref 26.0–34.0)
MCHC: 35.2 g/dL (ref 30.0–36.0)
MCV: 85.9 fL (ref 78.0–100.0)
MONO ABS: 0.5 10*3/uL (ref 0.1–1.0)
Monocytes Relative: 7 %
Neutro Abs: 3.4 10*3/uL (ref 1.7–7.7)
Neutrophils Relative %: 54 %
PLATELETS: 209 10*3/uL (ref 150–400)
RBC: 5.03 MIL/uL (ref 4.22–5.81)
RDW: 12.2 % (ref 11.5–15.5)
WBC: 6.3 10*3/uL (ref 4.0–10.5)

## 2015-09-07 LAB — MRSA PCR SCREENING: MRSA BY PCR: NEGATIVE

## 2015-09-07 LAB — I-STAT TROPONIN, ED: Troponin i, poc: 0.08 ng/mL (ref 0.00–0.08)

## 2015-09-07 MED ORDER — PRAVASTATIN SODIUM 20 MG PO TABS
10.0000 mg | ORAL_TABLET | Freq: Every day | ORAL | Status: DC
  Administered 2015-09-08 – 2015-09-16 (×9): 10 mg via ORAL
  Filled 2015-09-07 (×10): qty 1

## 2015-09-07 MED ORDER — ONDANSETRON HCL 4 MG/2ML IJ SOLN: 4.0000 mg | Freq: Four times a day (QID) | INTRAMUSCULAR | Status: DC | PRN

## 2015-09-07 MED ORDER — FOLIC ACID 1 MG PO TABS
1.0000 mg | ORAL_TABLET | Freq: Every day | ORAL | Status: DC
  Administered 2015-09-08 – 2015-09-16 (×9): 1 mg via ORAL
  Filled 2015-09-07 (×10): qty 1

## 2015-09-07 MED ORDER — ASPIRIN EC 81 MG PO TBEC
81.0000 mg | DELAYED_RELEASE_TABLET | Freq: Every day | ORAL | Status: DC
  Administered 2015-09-08 – 2015-09-16 (×9): 81 mg via ORAL
  Filled 2015-09-07 (×10): qty 1

## 2015-09-07 MED ORDER — CETYLPYRIDINIUM CHLORIDE 0.05 % MT LIQD
7.0000 mL | Freq: Two times a day (BID) | OROMUCOSAL | Status: DC
  Administered 2015-09-07 – 2015-09-16 (×14): 7 mL via OROMUCOSAL

## 2015-09-07 MED ORDER — VITAMIN B-1 100 MG PO TABS: 100.0000 mg | ORAL_TABLET | Freq: Every day | ORAL | Status: DC

## 2015-09-07 MED ORDER — INSULIN ASPART 100 UNIT/ML ~~LOC~~ SOLN
12.0000 [IU] | Freq: Three times a day (TID) | SUBCUTANEOUS | Status: DC
  Administered 2015-09-07: 12 [IU] via SUBCUTANEOUS

## 2015-09-07 MED ORDER — ADULT MULTIVITAMIN W/MINERALS CH
1.0000 | ORAL_TABLET | Freq: Every day | ORAL | Status: DC
  Administered 2015-09-08 – 2015-09-16 (×9): 1 via ORAL
  Filled 2015-09-07 (×10): qty 1

## 2015-09-07 MED ORDER — LORAZEPAM 2 MG/ML IJ SOLN: 1.0000 mg | Freq: Four times a day (QID) | INTRAMUSCULAR | Status: AC | PRN

## 2015-09-07 MED ORDER — THIAMINE HCL 100 MG/ML IJ SOLN
100.0000 mg | Freq: Every day | INTRAMUSCULAR | Status: DC
  Administered 2015-09-07: 100 mg via INTRAVENOUS
  Filled 2015-09-07: qty 2

## 2015-09-07 MED ORDER — LORAZEPAM 2 MG/ML IJ SOLN
0.0000 mg | Freq: Four times a day (QID) | INTRAMUSCULAR | Status: AC
  Administered 2015-09-07: 2 mg via INTRAVENOUS
  Filled 2015-09-07: qty 1

## 2015-09-07 MED ORDER — CARVEDILOL 6.25 MG PO TABS: 6.2500 mg | ORAL_TABLET | Freq: Two times a day (BID) | ORAL | Status: DC

## 2015-09-07 MED ORDER — ACETAMINOPHEN 325 MG PO TABS: 650.0000 mg | ORAL_TABLET | ORAL | Status: DC | PRN

## 2015-09-07 MED ORDER — BUPROPION HCL ER (XL) 150 MG PO TB24
150.0000 mg | ORAL_TABLET | Freq: Every day | ORAL | Status: DC
  Filled 2015-09-07 (×2): qty 1

## 2015-09-07 MED ORDER — SERTRALINE HCL 100 MG PO TABS
100.0000 mg | ORAL_TABLET | Freq: Every day | ORAL | Status: DC
  Administered 2015-09-07: 100 mg via ORAL
  Filled 2015-09-07 (×2): qty 1

## 2015-09-07 MED ORDER — ISOSORBIDE MONONITRATE ER 60 MG PO TB24
30.0000 mg | ORAL_TABLET | Freq: Every day | ORAL | Status: DC
  Administered 2015-09-08: 30 mg via ORAL
  Filled 2015-09-07: qty 1

## 2015-09-07 MED ORDER — INSULIN ASPART 100 UNIT/ML ~~LOC~~ SOLN
0.0000 [IU] | Freq: Every day | SUBCUTANEOUS | Status: DC
  Administered 2015-09-08: 3 [IU] via SUBCUTANEOUS
  Administered 2015-09-09 – 2015-09-10 (×2): 4 [IU] via SUBCUTANEOUS
  Administered 2015-09-11: 2 [IU] via SUBCUTANEOUS
  Administered 2015-09-11 – 2015-09-12 (×2): 3 [IU] via SUBCUTANEOUS
  Administered 2015-09-13: 4 [IU] via SUBCUTANEOUS

## 2015-09-07 MED ORDER — LORAZEPAM 2 MG/ML IJ SOLN: 0.0000 mg | Freq: Two times a day (BID) | INTRAMUSCULAR | Status: DC

## 2015-09-07 MED ORDER — INSULIN ASPART 100 UNIT/ML ~~LOC~~ SOLN
0.0000 [IU] | Freq: Three times a day (TID) | SUBCUTANEOUS | Status: DC
  Administered 2015-09-07: 11 [IU] via SUBCUTANEOUS
  Administered 2015-09-08: 5 [IU] via SUBCUTANEOUS
  Administered 2015-09-08: 3 [IU] via SUBCUTANEOUS
  Administered 2015-09-09 (×2): 5 [IU] via SUBCUTANEOUS
  Administered 2015-09-10: 3 [IU] via SUBCUTANEOUS
  Administered 2015-09-10: 2 [IU] via SUBCUTANEOUS
  Administered 2015-09-10: 8 [IU] via SUBCUTANEOUS
  Administered 2015-09-11: 5 [IU] via SUBCUTANEOUS
  Administered 2015-09-11 – 2015-09-12 (×3): 8 [IU] via SUBCUTANEOUS
  Administered 2015-09-12: 3 [IU] via SUBCUTANEOUS
  Administered 2015-09-12: 5 [IU] via SUBCUTANEOUS
  Administered 2015-09-13: 3 [IU] via SUBCUTANEOUS
  Administered 2015-09-13: 15 [IU] via SUBCUTANEOUS
  Administered 2015-09-14 (×2): 5 [IU] via SUBCUTANEOUS
  Administered 2015-09-14: 8 [IU] via SUBCUTANEOUS
  Administered 2015-09-15: 2 [IU] via SUBCUTANEOUS
  Administered 2015-09-15: 5 [IU] via SUBCUTANEOUS
  Administered 2015-09-15: 8 [IU] via SUBCUTANEOUS
  Administered 2015-09-16: 3 [IU] via SUBCUTANEOUS
  Administered 2015-09-16: 8 [IU] via SUBCUTANEOUS
  Administered 2015-09-16: 3 [IU] via SUBCUTANEOUS

## 2015-09-07 MED ORDER — LORAZEPAM 1 MG PO TABS
1.0000 mg | ORAL_TABLET | Freq: Four times a day (QID) | ORAL | Status: AC | PRN
  Administered 2015-09-08: 1 mg via ORAL
  Filled 2015-09-07: qty 1

## 2015-09-07 MED ORDER — VITAMIN B-1 100 MG PO TABS
100.0000 mg | ORAL_TABLET | Freq: Every day | ORAL | Status: DC
  Administered 2015-09-09 – 2015-09-16 (×8): 100 mg via ORAL
  Filled 2015-09-07 (×9): qty 1

## 2015-09-07 MED ORDER — LORAZEPAM 2 MG/ML IJ SOLN: 0.0000 mg | Freq: Two times a day (BID) | INTRAMUSCULAR | Status: AC

## 2015-09-07 MED ORDER — ENOXAPARIN SODIUM 40 MG/0.4ML ~~LOC~~ SOLN
40.0000 mg | SUBCUTANEOUS | Status: DC
  Administered 2015-09-07 – 2015-09-15 (×9): 40 mg via SUBCUTANEOUS
  Filled 2015-09-07 (×10): qty 0.4

## 2015-09-07 MED ORDER — LISINOPRIL 10 MG PO TABS
20.0000 mg | ORAL_TABLET | Freq: Every day | ORAL | Status: DC
  Filled 2015-09-07: qty 1

## 2015-09-07 MED ORDER — SODIUM CHLORIDE 0.9% FLUSH
3.0000 mL | Freq: Two times a day (BID) | INTRAVENOUS | Status: DC
  Administered 2015-09-07 – 2015-09-16 (×19): 3 mL via INTRAVENOUS

## 2015-09-07 MED ORDER — CETYLPYRIDINIUM CHLORIDE 0.05 % MT LIQD: 7.0000 mL | Freq: Two times a day (BID) | OROMUCOSAL | Status: DC

## 2015-09-07 MED ORDER — TRAZODONE HCL 50 MG PO TABS
100.0000 mg | ORAL_TABLET | Freq: Every day | ORAL | Status: DC
  Administered 2015-09-07 – 2015-09-15 (×9): 100 mg via ORAL
  Filled 2015-09-07 (×2): qty 2
  Filled 2015-09-07: qty 1
  Filled 2015-09-07 (×7): qty 2

## 2015-09-07 MED ORDER — SODIUM CHLORIDE 0.9% FLUSH: 3.0000 mL | INTRAVENOUS | Status: DC | PRN

## 2015-09-07 MED ORDER — INSULIN GLARGINE 100 UNIT/ML ~~LOC~~ SOLN
45.0000 [IU] | Freq: Every morning | SUBCUTANEOUS | Status: DC
  Administered 2015-09-07: 45 [IU] via SUBCUTANEOUS
  Filled 2015-09-07 (×2): qty 0.45

## 2015-09-07 MED ORDER — THIAMINE HCL 100 MG/ML IJ SOLN
100.0000 mg | Freq: Every day | INTRAMUSCULAR | Status: DC
  Administered 2015-09-08: 100 mg via INTRAVENOUS
  Filled 2015-09-07: qty 1
  Filled 2015-09-07: qty 2

## 2015-09-07 MED ORDER — CHLORHEXIDINE GLUCONATE 0.12 % MT SOLN
15.0000 mL | Freq: Two times a day (BID) | OROMUCOSAL | Status: DC
  Filled 2015-09-07: qty 15

## 2015-09-07 MED ORDER — FUROSEMIDE 10 MG/ML IJ SOLN
40.0000 mg | Freq: Two times a day (BID) | INTRAMUSCULAR | Status: DC
  Administered 2015-09-07 – 2015-09-08 (×3): 40 mg via INTRAVENOUS
  Filled 2015-09-07 (×3): qty 4

## 2015-09-07 MED ORDER — SODIUM CHLORIDE 0.9 % IV SOLN: 250.0000 mL | INTRAVENOUS | Status: DC | PRN

## 2015-09-07 MED ORDER — FUROSEMIDE 10 MG/ML IJ SOLN
60.0000 mg | Freq: Once | INTRAMUSCULAR | Status: AC
Start: 2015-09-07 — End: 2015-09-07
  Administered 2015-09-07: 60 mg via INTRAVENOUS
  Filled 2015-09-07: qty 8

## 2015-09-07 MED ORDER — LORAZEPAM 2 MG/ML IJ SOLN
0.0000 mg | Freq: Four times a day (QID) | INTRAMUSCULAR | Status: DC
  Administered 2015-09-07: 1 mg via INTRAVENOUS
  Filled 2015-09-07: qty 1

## 2015-09-07 NOTE — Progress Notes (Signed)
Patient transferring to room 1238, report given to [facility]

## 2015-09-07 NOTE — Progress Notes (Signed)
Received pt from ED, VS obtained, telemetry box applied, call light placed in reach

## 2015-09-07 NOTE — ED Notes (Addendum)
Delay in EKG related to equipment failure; portable EKG completed.

## 2015-09-07 NOTE — Progress Notes (Signed)
Paged MD to make aware of 6, 10, and 16 beat runs of multifocal PVCs

## 2015-09-07 NOTE — ED Provider Notes (Signed)
CSN: [id number]     Arrival date & time 09/07/15  0614 History   First MD Initiated Contact with Patient 09/07/15 0719     Chief Complaint  Patient presents with  . Shortness of Breath     (Consider location/radiation/quality/duration/timing/severity/associated sxs/prior Treatment) HPI Comments: 60 year old male with history of CHF, insulin-dependent diabetes presents for shortness of breath. The patient reports that over the last month he has been binge drinking and smoking and has not taken his medications. He said that he was living with his fiance and his mother and he just left and has been living on the streets. He reports that he has been having progressively worse shortness of breath over this period of time. He denies any fever but has had a cough and has been coughing up clear phlegm. He says that this feels like when he filling up with fluid. He said he cannot lay flat because he becomes so short of breath.  Patient is a 60 y.o. male presenting with shortness of breath.  Shortness of Breath Associated symptoms: chest pain and cough   Associated symptoms: no abdominal pain, no fever, no headaches, no rash and no vomiting     Past Medical History  Diagnosis Date  . Diabetes mellitus   . Hypertension   . Coronary artery disease   . MI (myocardial infarction) (HCC) 63    age 36 per patient  . Mental disorder   . Depression    Past Surgical History  Procedure Laterality Date  . Angioplasty  1984    at age 53  . Left and right heart catheterization with coronary/graft angiogram  08/13/2012    Procedure: LEFT AND RIGHT HEART CATHETERIZATION WITH [name];  Surgeon: Kathleene Hazel, MD;  Location: [facility] CATH LAB;  Service: Cardiovascular;;   Family History  Problem Relation Age of Onset  . Heart attack Father     >60s  . Hypertension Father   . Diabetes Father   . Heart attack Sister   . Hypertension Mother    Social History  Substance Use Topics   . Smoking status: Current Every Day Smoker -- 0.50 packs/day    Types: Cigarettes  . Smokeless tobacco: Never Used  . Alcohol Use: 1.2 oz/week    2 Cans of beer per week     Comment: drinking daily (2 40oz beer a day)    Review of Systems  Constitutional: Negative for fever, chills and fatigue.  HENT: Negative for congestion, postnasal drip and rhinorrhea.   Eyes: Negative for visual disturbance.  Respiratory: Positive for cough and shortness of breath. Negative for chest tightness.   Cardiovascular: Positive for chest pain. Negative for palpitations and leg swelling.  Gastrointestinal: Negative for nausea, vomiting, abdominal pain, diarrhea and constipation.  Genitourinary: Negative for dysuria, urgency, frequency and hematuria.  Musculoskeletal: Negative for myalgias and back pain.  Skin: Negative for rash.  Neurological: Negative for dizziness, weakness, light-headedness and headaches.  Hematological: Does not bruise/bleed easily.      Allergies  Review of patient's allergies indicates no known allergies.  Home Medications   Prior to Admission medications   Medication Sig Start Date End Date Taking? Authorizing Provider  aspirin 81 MG tablet Take 81 mg by mouth daily.    Historical Provider, MD  buPROPion (WELLBUTRIN XL) 150 MG 24 hr tablet Take 150 mg by mouth daily.    Historical Provider, MD  carvedilol (COREG) 6.25 MG tablet Take 1 tablet (6.25 mg total) by mouth 2 (two)  times daily with a meal. 02/27/14   Christina P Rama, MD  feeding supplement, GLUCERNA SHAKE, (GLUCERNA SHAKE) LIQD Take 237 mLs by mouth 3 (three) times daily between meals. 02/27/14   Maryruth Bun Rama, MD  folic acid (FOLVITE) 1 MG tablet Take 1 tablet (1 mg total) by mouth daily. 02/27/14   Maryruth Bun Rama, MD  furosemide (LASIX) 40 MG tablet Take 1 tablet (40 mg total) by mouth daily. 02/27/14   Maryruth Bun Rama, MD  insulin aspart (NOVOLOG FLEXPEN) 100 UNIT/ML FlexPen Inject 12 Units into the skin 3  (three) times daily with meals. 03/12/15   Calvert Cantor, MD  insulin aspart (NOVOLOG) 100 UNIT/ML injection Inject 12 Units into the skin 3 (three) times daily with meals. 03/12/15   Calvert Cantor, MD  Insulin Glargine (LANTUS SOLOSTAR) 100 UNIT/ML Solostar Pen Inject 45 Units into the skin every morning. 03/13/15   Calvert Cantor, MD  insulin glargine (LANTUS) 100 UNIT/ML injection Inject 0.45 mLs (45 Units total) into the skin daily. For high blood sugar control 03/12/15   Calvert Cantor, MD  lisinopril (PRINIVIL,ZESTRIL) 20 MG tablet Take 20 mg by mouth daily.    Historical Provider, MD  metFORMIN (GLUCOPHAGE) 1000 MG tablet Take 0.5 tablets (500 mg total) by mouth 2 (two) times daily with a meal. For high blood sugar control 12/27/12   Sanjuana Kava, NP  Multiple Vitamin (MULTIVITAMIN WITH MINERALS) TABS tablet Take 1 tablet by mouth daily. 02/27/14   Maryruth Bun Rama, MD  potassium chloride 20 MEQ TBCR Take 20 mEq by mouth daily. For low potassium supplement 02/27/14   Maryruth Bun Rama, MD  pravastatin (PRAVACHOL) 10 MG tablet Take 1 tablet (10 mg total) by mouth daily. For high cholesterol control 12/27/12   Sanjuana Kava, NP  sertraline (ZOLOFT) 100 MG tablet Take 1 tablet (100 mg total) by mouth at bedtime. For depression 12/27/12   Sanjuana Kava, NP  thiamine 100 MG tablet Take 1 tablet (100 mg total) by mouth daily. 02/27/14   Maryruth Bun Rama, MD  traZODone (DESYREL) 100 MG tablet Take 1 tablet (100 mg total) by mouth at bedtime. For sleep 12/27/12   Sanjuana Kava, NP   BP 158/94 mmHg  Pulse 106  Temp(Src) 97.7 F (36.5 C) (Oral)  Resp 21  Ht 6' 2.5" (1.892 m)  Wt 158 lb (71.668 kg)  BMI 20.02 kg/m2  SpO2 91% Physical Exam  Constitutional: He is oriented to person, place, and time. He appears well-developed and well-nourished. No distress.  HENT:  Head: Normocephalic and atraumatic.  Right Ear: External ear normal.  Left Ear: External ear normal.  Mouth/Throat: Oropharynx is clear and moist.  No oropharyngeal exudate.  Eyes: EOM are normal. Pupils are equal, round, and reactive to light.  Neck: Normal range of motion. Neck supple.  Cardiovascular: Regular rhythm, normal heart sounds and intact distal pulses.  Tachycardia present.   No murmur heard. Pulmonary/Chest: Effort normal. No respiratory distress. He has no wheezes. He has no rales.  Abdominal: Soft. He exhibits no distension. There is no tenderness.  Musculoskeletal: He exhibits no edema.  Neurological: He is alert and oriented to person, place, and time.  Skin: Skin is warm and dry. No rash noted. He is not diaphoretic.  Vitals reviewed.   ED Course  Procedures (including critical care time) Labs Review Labs Reviewed  COMPREHENSIVE METABOLIC PANEL - Abnormal; Notable for the following:    Glucose, Bld 335 (*)    Calcium 8.8 (*)  Albumin 3.1 (*)    AST 55 (*)    Total Bilirubin 1.4 (*)    All other components within normal limits  BRAIN NATRIURETIC PEPTIDE - Abnormal; Notable for the following:    B Natriuretic Peptide 1076.5 (*)    All other components within normal limits  CBC WITH DIFFERENTIAL/PLATELET  Rosezena Sensor, ED    Imaging Review Dg Chest 2 View  09/07/2015  CLINICAL DATA:  Increased shortness of breath, weakness and lethargy. History of smoking. EXAM: CHEST  2 VIEW COMPARISON:  03/10/2015; 02/20/2014; 08/12/2012 FINDINGS: Grossly unchanged cardiac silhouette and mediastinal contours with atherosclerotic plaque within the thoracic aorta. The pulmonary vasculature is indistinct with cephalization of flow. Interval development of small to moderate sized layering bilateral effusions with associated bilateral mid and lower lung heterogeneous / consolidative opacities. No pneumothorax. No acute osseous abnormalities. IMPRESSION: Findings compatible with pulmonary edema with small to moderate size bilateral effusions and associated bilateral mid and lower lung opacities, atelectasis versus infiltrate.  Electronically Signed   By: Simonne Come M.D.   On: 09/07/2015 08:45   I have personally reviewed and evaluated these images and lab results as part of my medical decision-making.   EKG Interpretation None      MDM  Patient was seen and evaluated in stable condition. Patient required supplemental oxygen. He was given a dose of IV Lasix. Results were consistent with CHF exacerbation. Patient was placed on Cipro protocol and given thiamine. Discussed with [facility] hospitalist who agreed with admission. Patient was admitted for continued diuresis and treatment of his CHF. Patient was updated on all results and plan of care. Patient was noted to be hyperglycemic but was not acidotic. Final diagnoses:  None    1.  Acute CHF exacerbation 2. Hypoxia 3. Hyperglycemia 4. Medical noncompliance    Leta Baptist, MD 09/07/15 1110

## 2015-09-07 NOTE — Care Management Note (Signed)
Case Management Note  Patient Details  Name: [name] MRN: [id number] Date of Birth: 03-23-1956  Subjective/Objective: 60 y/o m admitted w/CHF. From home.                   Action/Plan:d/c plan home.   Expected Discharge Date:   (UNKNOWN)               Expected Discharge Plan:  Home/Self Care  In-House Referral:     Discharge planning Services  CM Consult  Post Acute Care Choice:    Choice offered to:     DME Arranged:    DME Agency:     HH Arranged:    HH Agency:     Status of Service:  In process, will continue to follow  Medicare Important Message Given:    Date Medicare IM Given:    Medicare IM give by:    Date Additional Medicare IM Given:    Additional Medicare Important Message give by:     If discussed at Long Length of Stay Meetings, dates discussed:    Additional Comments:  Lanier Clam, RN 09/07/2015, 2:50 PM

## 2015-09-07 NOTE — H&P (Signed)
History and Physical    [name]:[id number] DOB: 08-14-1955 DOA: 09/07/2015  PCP: [facility]  Patient coming from: Home  Chief Complaint: Shortness of breath  HPI: [name] is a 60 y.o. male with medical history significant of chronic combined systolic and diastolic congestive heart failure, last transthoracic echocardiogram performed on 03/10/2015 that showed an EF of 30-35% with grade 3 diastolic dysfunction, history of hypertension, insulin-dependent diabetes mellitus, coronary artery disease, as well as having ongoing alcohol abuse as well as history of cocaine use. He presents to the emergency room, is intoxicated, reports last drinking this morning. He came in with complaints of shortness of breath becoming progressively worse over the past week. This is associated with orthopnea. Denies fevers, chills, cough, sputum production. He states drinking both beer and liquor and "anything I can get my hands on." Poor historian, appears intoxicated. He tells me that he has not been taking his medications recently.  ED Course: In the emergency department had a BNP of 1076, chest x-ray showing evidence of pulmonary edema. He was given 60 mg of IV Lasix.  Review of Systems: Difficult to obtain a reliable review of systems, he appears intoxicated, poor historian.  Past Medical History  Diagnosis Date  . Diabetes mellitus   . Hypertension   . Coronary artery disease   . MI (myocardial infarction) (HCC) 65    age 75 per patient  . Mental disorder   . Depression     Past Surgical History  Procedure Laterality Date  . Angioplasty  1984    at age 31  . Left and right heart catheterization with coronary/graft angiogram  08/13/2012    Procedure: LEFT AND RIGHT HEART CATHETERIZATION WITH [name];  Surgeon: Kathleene Hazel, MD;  Location: [facility] CATH LAB;  Service: Cardiovascular;;     reports that he has been smoking Cigarettes.  He has been  smoking about 0.50 packs per day. He has never used smokeless tobacco. He reports that he drinks about 1.2 oz of alcohol per week. He reports that he uses illicit drugs (Cocaine and Marijuana) about 7 times per week.  No Known Allergies  Family History  Problem Relation Age of Onset  . Heart attack Father     >60s  . Hypertension Father   . Diabetes Father   . Heart attack Sister   . Hypertension Mother     Prior to Admission medications   Medication Sig Start Date End Date Taking? Authorizing Provider  carvedilol (COREG) 6.25 MG tablet Take 1 tablet (6.25 mg total) by mouth 2 (two) times daily with a meal. 02/27/14  Yes Christina P Rama, MD  feeding supplement, GLUCERNA SHAKE, (GLUCERNA SHAKE) LIQD Take 237 mLs by mouth 3 (three) times daily between meals. 02/27/14  Yes Maryruth Bun Rama, MD  folic acid (FOLVITE) 1 MG tablet Take 1 tablet (1 mg total) by mouth daily. 02/27/14  Yes Christina P Rama, MD  furosemide (LASIX) 40 MG tablet Take 1 tablet (40 mg total) by mouth daily. 02/27/14  Yes Christina P Rama, MD  insulin aspart (NOVOLOG FLEXPEN) 100 UNIT/ML FlexPen Inject 12 Units into the skin 3 (three) times daily with meals. 03/12/15  Yes Calvert Cantor, MD  insulin glargine (LANTUS) 100 UNIT/ML injection Inject 0.45 mLs (45 Units total) into the skin daily. For high blood sugar control 03/12/15  Yes Calvert Cantor, MD  metFORMIN (GLUCOPHAGE) 1000 MG tablet Take 0.5 tablets (500 mg total) by mouth 2 (two) times daily  with a meal. For high blood sugar control Patient taking differently: Take 1,000 mg by mouth 2 (two) times daily with a meal. For high blood sugar control 12/27/12  Yes Sanjuana Kava, NP  aspirin 81 MG tablet Take 81 mg by mouth daily.    Historical Provider, MD  buPROPion (WELLBUTRIN XL) 150 MG 24 hr tablet Take 150 mg by mouth daily.    Historical Provider, MD  insulin aspart (NOVOLOG) 100 UNIT/ML injection Inject 12 Units into the skin 3 (three) times daily with meals. 03/12/15    Calvert Cantor, MD  Insulin Glargine (LANTUS SOLOSTAR) 100 UNIT/ML Solostar Pen Inject 45 Units into the skin every morning. 03/13/15   Calvert Cantor, MD  lisinopril (PRINIVIL,ZESTRIL) 20 MG tablet Take 20 mg by mouth daily.    Historical Provider, MD  Multiple Vitamin (MULTIVITAMIN WITH MINERALS) TABS tablet Take 1 tablet by mouth daily. 02/27/14   Maryruth Bun Rama, MD  potassium chloride 20 MEQ TBCR Take 20 mEq by mouth daily. For low potassium supplement 02/27/14   Maryruth Bun Rama, MD  pravastatin (PRAVACHOL) 10 MG tablet Take 1 tablet (10 mg total) by mouth daily. For high cholesterol control 12/27/12   Sanjuana Kava, NP  sertraline (ZOLOFT) 100 MG tablet Take 1 tablet (100 mg total) by mouth at bedtime. For depression 12/27/12   Sanjuana Kava, NP  thiamine 100 MG tablet Take 1 tablet (100 mg total) by mouth daily. 02/27/14   Maryruth Bun Rama, MD  traZODone (DESYREL) 100 MG tablet Take 1 tablet (100 mg total) by mouth at bedtime. For sleep 12/27/12   Sanjuana Kava, NP    Physical Exam: Filed Vitals:   09/07/15 0930 09/07/15 0937 09/07/15 1059 09/07/15 1127  BP: 155/105 152/98 158/94 153/99  Pulse: 105 103 106 102  Temp:    98.2 F (36.8 C)  TempSrc:    Oral  Resp: 23 22 21 22   Height:    6\' 2"  (1.88 m)  Weight:    73.029 kg (161 lb)  SpO2: 94% 93% 91% 93%      Constitutional: Patient appearing intoxicated, alcohol in his breath, slurring his words. Filed Vitals:   09/07/15 0930 09/07/15 0937 09/07/15 1059 09/07/15 1127  BP: 155/105 152/98 158/94 153/99  Pulse: 105 103 106 102  Temp:    98.2 F (36.8 C)  TempSrc:    Oral  Resp: 23 22 21 22   Height:    6\' 2"  (1.88 m)  Weight:    73.029 kg (161 lb)  SpO2: 94% 93% 91% 93%   Eyes: PERRL, lids and conjunctivae normal ENMT: Mucous membranes are moist. Posterior pharynx clear of any exudate or lesions.Normal dentition.  Neck: normal, supple, no masses, no thyromegaly Respiratory:  Diminished breath sounds with bilateral crackles and  rhonchi. On supplemental oxygen. Cardiovascular: Regular rate and rhythm, 2 at a 6 systolic ejection murmur, no rubs / gallops. He has pedal edema. No carotid bruits.  Abdomen: no tenderness, no masses palpated. No hepatosplenomegaly. Bowel sounds positive.  Musculoskeletal: no clubbing / cyanosis. No joint deformity upper and lower extremities. Good ROM, no contractures. Normal muscle tone.  Skin: no rashes, lesions, ulcers. No induration Neurologic: CN 2-12 grossly intact. Sensation intact, DTR normal. Strength 5/5 in all 4.  Psychiatric: He appears intoxicated   Labs on Admission: I have personally reviewed following labs and imaging studies  CBC:  Recent Labs Lab 09/07/15 0805  WBC 6.3  NEUTROABS 3.4  HGB 15.2  HCT 43.2  MCV 85.9  PLT 209   Basic Metabolic Panel:  Recent Labs Lab 09/07/15 0805  NA 135  K 4.0  CL 102  CO2 22  GLUCOSE 335*  BUN 9  CREATININE 0.91  CALCIUM 8.8*   GFR: Estimated Creatinine Clearance: 88 mL/min (by C-G formula based on Cr of 0.91). Liver Function Tests:  Recent Labs Lab 09/07/15 0805  AST 55*  ALT 42  ALKPHOS 85  BILITOT 1.4*  PROT 7.0  ALBUMIN 3.1*   No results for input(s): LIPASE, AMYLASE in the last 168 hours. No results for input(s): AMMONIA in the last 168 hours. Coagulation Profile: No results for input(s): INR, PROTIME in the last 168 hours. Cardiac Enzymes: No results for input(s): CKTOTAL, CKMB, CKMBINDEX, TROPONINI in the last 168 hours. BNP (last 3 results) No results for input(s): PROBNP in the last 8760 hours. HbA1C: No results for input(s): HGBA1C in the last 72 hours. CBG:  Recent Labs Lab 09/07/15 1142  GLUCAP 305*   Lipid Profile: No results for input(s): CHOL, HDL, LDLCALC, TRIG, CHOLHDL, LDLDIRECT in the last 72 hours. Thyroid Function Tests: No results for input(s): TSH, T4TOTAL, FREET4, T3FREE, THYROIDAB in the last 72 hours. Anemia Panel: No results for input(s): VITAMINB12, FOLATE,  FERRITIN, TIBC, IRON, RETICCTPCT in the last 72 hours. Urine analysis:    Component Value Date/Time   COLORURINE AMBER* 02/20/2014 1339   APPEARANCEUR CLOUDY* 02/20/2014 1339   LABSPEC 1.019 02/20/2014 1339   PHURINE 6.0 02/20/2014 1339   GLUCOSEU 250* 02/20/2014 1339   HGBUR TRACE* 02/20/2014 1339   BILIRUBINUR SMALL* 02/20/2014 1339   KETONESUR 15* 02/20/2014 1339   PROTEINUR 100* 02/20/2014 1339   UROBILINOGEN 2.0* 02/20/2014 1339   NITRITE NEGATIVE 02/20/2014 1339   LEUKOCYTESUR NEGATIVE 02/20/2014 1339   Sepsis Labs: !!!!!!!!!!!!!!!!!!!!!!!!!!!!!!!!!!!!!!!!!!!! @LABRCNTIP (procalcitonin:4,lacticidven:4) )No results found for this or any previous visit (from the past 240 hour(s)).   Radiological Exams on Admission: Dg Chest 2 View  09/07/2015  CLINICAL DATA:  Increased shortness of breath, weakness and lethargy. History of smoking. EXAM: CHEST  2 VIEW COMPARISON:  03/10/2015; 02/20/2014; 08/12/2012 FINDINGS: Grossly unchanged cardiac silhouette and mediastinal contours with atherosclerotic plaque within the thoracic aorta. The pulmonary vasculature is indistinct with cephalization of flow. Interval development of small to moderate sized layering bilateral effusions with associated bilateral mid and lower lung heterogeneous / consolidative opacities. No pneumothorax. No acute osseous abnormalities. IMPRESSION: Findings compatible with pulmonary edema with small to moderate size bilateral effusions and associated bilateral mid and lower lung opacities, atelectasis versus infiltrate. Electronically Signed   By: Simonne Come M.D.   On: 09/07/2015 08:45    EKG: Independently reviewed.   Assessment/Plan Principal Problem:   Acute on chronic combined systolic (congestive) and diastolic (congestive) heart failure (HCC) Active Problems:   Diabetes mellitus, type II (HCC)   Hypertension   Cardiomyopathy, nonischemic (HCC)   ETOH abuse   Alcohol dependence with withdrawal delirium (HCC)    CHF (congestive heart failure) (HCC)   1.  Acute on chronic combined systolic and diastolic congestive heart failure. [name] is a 60 year old gentleman with a past medical history of alcohol abuse, binge drinking for the past month not taking his medications. He has a history of combined systolic and diastolic congestive heart failure with his last transthoracic echocardiogram performed on 03/10/2015 that showed an EF of 30-35% with grade 3 diastolic dysfunction. Acute on chronic CHF likely secondary to alcohol abuse and medication nonadherence. Will diuresis him with IV Lasix 40 mg twice  daily. He was given 60 mg in the emergency department. Medication records show any was supposed be on 40 mg by mouth daily. Follow ins and outs and daily weights. Restart lisinopril 20 mg by mouth daily. Hold beta blocker therapy since he has not been taking his medications.   2.  Chronic alcohol abuse. Despite having advanced heart failure unfortunately [name] continues to drink. Medical records indicate he has a history of delirium tremens. States his last alcoholic beverage was this morning. He is at a high risk for developing DTs. Will place him on CIWA protocol with IV Ativan. Monitor closely.  3.  Type 2 diabetes mellitus, insulin-dependent. He presents with hyperglycemia having blood sugars in the 300s, likely resulting from not taking his insulin. Will restart his home dose with Lantus 45 units subcutaneous daily. Discontinue metformin provide sinus kill coverage with Accu-Cheks.   4.  Hypertension. He presents with elevated blood pressures secondary to not taking his medications and alcohol abuse. Restarting lisinopril 20 mg by mouth daily, add Imdur 30 mg PO q daily  5.  History of coronary artery disease. Currently denies chest pain, troponin negative at 0.08 in the emergency department. Place him on continuous cardiac monitoring.    DVT prophylaxis: Lovenox Code Status: Full code Family  Communication: His family is not present Disposition Plan: Anticipate he'll require greater than 2 nights hospitalization Consults called:  Admission status: Admit to telemetry   Jeralyn Bennett MD [facility] Pager [phone]  If 7PM-7AM, please contact night-coverage [email or web address] Password [facility]  09/07/2015, 12:36 PM

## 2015-09-07 NOTE — ED Notes (Signed)
Patient is complaining of SOB due to drinking, smoking, and not taken his medicine. Patient states that it is hard to breath.

## 2015-09-08 LAB — GLUCOSE, CAPILLARY
GLUCOSE-CAPILLARY: 106 mg/dL — AB (ref 65–99)
GLUCOSE-CAPILLARY: 173 mg/dL — AB (ref 65–99)
Glucose-Capillary: 249 mg/dL — ABNORMAL HIGH (ref 65–99)
Glucose-Capillary: 279 mg/dL — ABNORMAL HIGH (ref 65–99)

## 2015-09-08 LAB — BASIC METABOLIC PANEL
Anion gap: 11 (ref 5–15)
BUN: 18 mg/dL (ref 6–20)
CHLORIDE: 106 mmol/L (ref 101–111)
CO2: 23 mmol/L (ref 22–32)
Calcium: 8.7 mg/dL — ABNORMAL LOW (ref 8.9–10.3)
Creatinine, Ser: 1.01 mg/dL (ref 0.61–1.24)
GFR calc Af Amer: >60 mL/min (ref >60)
GFR calc non Af Amer: >60 mL/min (ref >60)
Glucose, Bld: 130 mg/dL — ABNORMAL HIGH (ref 65–99)
POTASSIUM: 3.7 mmol/L (ref 3.5–5.1)
SODIUM: 140 mmol/L (ref 135–145)

## 2015-09-08 LAB — MAGNESIUM: MAGNESIUM: 1.9 mg/dL (ref 1.7–2.4)

## 2015-09-08 MED ORDER — SODIUM CHLORIDE 0.9 % IV BOLUS (SEPSIS)
250.0000 mL | Freq: Once | INTRAVENOUS | Status: AC
  Administered 2015-09-08: 250 mL via INTRAVENOUS

## 2015-09-08 MED ORDER — CARVEDILOL 6.25 MG PO TABS
6.2500 mg | ORAL_TABLET | Freq: Two times a day (BID) | ORAL | Status: DC
  Administered 2015-09-08 (×2): 6.25 mg via ORAL
  Filled 2015-09-08 (×2): qty 1

## 2015-09-08 MED ORDER — POTASSIUM CHLORIDE CRYS ER 20 MEQ PO TBCR
40.0000 meq | EXTENDED_RELEASE_TABLET | Freq: Once | ORAL | Status: AC
  Administered 2015-09-08: 40 meq via ORAL
  Filled 2015-09-08: qty 2

## 2015-09-08 NOTE — Progress Notes (Signed)
PROGRESS NOTE    [name]  OFB:[id number] DOB: 09/03/1955 DOA: 09/07/2015 PCP: [facility]    Brief Narrative: [name] is a 60 y.o. male with medical history significant of chronic combined systolic and diastolic congestive heart failure, last transthoracic echocardiogram performed on 03/10/2015 that showed an EF of 30-35% with grade 3 diastolic dysfunction, history of hypertension, insulin-dependent diabetes mellitus, coronary artery disease, as well as having ongoing alcohol abuse as well as history of cocaine use. He presents to the emergency room, intoxicated, reports last drinking the  Morning of admission. He came in with complaints of shortness of breath becoming progressively worse over the past week.  Patient admitted with acute on chronic combine Heart failure exacerbation, he will be treated for the same with IV lasix. He was started on CIWA due to high risk for alcohol withdrawal.     Assessment & Plan:   Principal Problem:   Acute on chronic combined systolic (congestive) and diastolic (congestive) heart failure (HCC) Active Problems:   Diabetes mellitus, type II (HCC)   Hypertension   Cardiomyopathy, nonischemic (HCC)   ETOH abuse   Alcohol dependence with withdrawal delirium (HCC)   CHF (congestive heart failure) (HCC)  Acute on chronic combined systolic and diastolic congestive heart failure Last ECHO ; 03/10/2015 that showed an EF of 30-35% with grade 3 diastolic dysfunction Continue with IV Lasix 40 mg twice daily Weight; 73---71---73   Negative 1.7 L.  Hold lisinopril SBP soft this am in the 100/  Resume coreg due to PVC.  Continue with Imdur.   Chronic alcohol abuse; at risk for DT. CIWA protocol.   Type 2 diabetes mellitus, insulin-dependent  Hypertension hold lisinopril, SBP soft to avoid hypotension   History of coronary artery disease. Currently denies chest pain, troponin negative at 0.08 in the emergency department. Place him  on continuous cardiac monitoring.  Depression;  unclear if he has been taking Zoloft, Wellbutrin, hold to avoid sedation.   DVT prophylaxis: Lovenox Code Status: Full code.  Family Communication: care discussed with patient  Disposition Plan: Remain in the step down unit for Treatment HF and high risk alcohol withdrawal. Home when stable, might benefit from Rehab program.    Consultants:   none  Procedures:   none  Antimicrobials:   none   Subjective: He is sleepy but wake up and answer some questions. He relates dyspnea has improved. He is asking for water. No significant tremors of Upper extremities.   Objective: Filed Vitals:   09/08/15 0300 09/08/15 0400 09/08/15 0500 09/08/15 0600  BP:  100/56  116/68  Pulse: 102 101  97  Temp: 98.6 F (37 C)     TempSrc: Oral     Resp: 22 22  22   Height:      Weight:   73.8 kg (162 lb 11.2 oz)   SpO2: 95% 94%  97%    Intake/Output Summary (Last 24 hours) at 09/08/15 0745 Last data filed at 09/08/15 0000  Gross per 24 hour  Intake      3 ml  Output   1750 ml  Net  -1747 ml   Filed Weights   09/07/15 1127 09/07/15 1736 09/08/15 0500  Weight: 73.029 kg (161 lb) 71.8 kg (158 lb 4.6 oz) 73.8 kg (162 lb 11.2 oz)    Examination:  General exam: Appears calm and comfortable  Respiratory system: Respiratory effort normal. Bilateral crackles.  Cardiovascular system: S1 & S2 heard, RRR. No pedal edema. Gastrointestinal system:  Abdomen is nondistended, soft and nontender. No organomegaly or masses felt. Normal bowel sounds heard. Central nervous system: Sleepy, answer some questions.  Extremities: Symmetric 5 x 5 power. Skin: No rashes, lesions or ulcers     Data Reviewed: I have personally reviewed following labs and imaging studies  CBC:  Recent Labs Lab 09/07/15 0805  WBC 6.3  NEUTROABS 3.4  HGB 15.2  HCT 43.2  MCV 85.9  PLT 209   Basic Metabolic Panel:  Recent Labs Lab 09/07/15 0805 09/08/15 0304  NA  135 140  K 4.0 3.7  CL 102 106  CO2 22 23  GLUCOSE 335* 130*  BUN 9 18  CREATININE 0.91 1.01  CALCIUM 8.8* 8.7*   GFR: Estimated Creatinine Clearance: 80.2 mL/min (by C-G formula based on Cr of 1.01). Liver Function Tests:  Recent Labs Lab 09/07/15 0805  AST 55*  ALT 42  ALKPHOS 85  BILITOT 1.4*  PROT 7.0  ALBUMIN 3.1*   No results for input(s): LIPASE, AMYLASE in the last 168 hours. No results for input(s): AMMONIA in the last 168 hours. Coagulation Profile: No results for input(s): INR, PROTIME in the last 168 hours. Cardiac Enzymes: No results for input(s): CKTOTAL, CKMB, CKMBINDEX, TROPONINI in the last 168 hours. BNP (last 3 results) No results for input(s): PROBNP in the last 8760 hours. HbA1C: No results for input(s): HGBA1C in the last 72 hours. CBG:  Recent Labs Lab 09/07/15 1142 09/07/15 1606 09/07/15 2153  GLUCAP 305* 81 118*   Lipid Profile: No results for input(s): CHOL, HDL, LDLCALC, TRIG, CHOLHDL, LDLDIRECT in the last 72 hours. Thyroid Function Tests: No results for input(s): TSH, T4TOTAL, FREET4, T3FREE, THYROIDAB in the last 72 hours. Anemia Panel: No results for input(s): VITAMINB12, FOLATE, FERRITIN, TIBC, IRON, RETICCTPCT in the last 72 hours. Sepsis Labs: No results for input(s): PROCALCITON, LATICACIDVEN in the last 168 hours.  Recent Results (from the past 240 hour(s))  MRSA PCR Screening     Status: None   Collection Time: 09/07/15  5:28 PM  Result Value Ref Range Status   MRSA by PCR NEGATIVE NEGATIVE Final    Comment:        The GeneXpert MRSA Assay (FDA approved for NASAL specimens only), is one component of a comprehensive MRSA colonization surveillance program. It is not intended to diagnose MRSA infection nor to guide or monitor treatment for MRSA infections.          Radiology Studies: Dg Chest 2 View  09/07/2015  CLINICAL DATA:  Increased shortness of breath, weakness and lethargy. History of smoking. EXAM:  CHEST  2 VIEW COMPARISON:  03/10/2015; 02/20/2014; 08/12/2012 FINDINGS: Grossly unchanged cardiac silhouette and mediastinal contours with atherosclerotic plaque within the thoracic aorta. The pulmonary vasculature is indistinct with cephalization of flow. Interval development of small to moderate sized layering bilateral effusions with associated bilateral mid and lower lung heterogeneous / consolidative opacities. No pneumothorax. No acute osseous abnormalities. IMPRESSION: Findings compatible with pulmonary edema with small to moderate size bilateral effusions and associated bilateral mid and lower lung opacities, atelectasis versus infiltrate. Electronically Signed   By: Simonne Come M.D.   On: 09/07/2015 08:45        Scheduled Meds: . antiseptic oral rinse  7 mL Mouth Rinse BID  . aspirin EC  81 mg Oral Daily  . enoxaparin (LOVENOX) injection  40 mg Subcutaneous Q24H  . folic acid  1 mg Oral Daily  . furosemide  40 mg Intravenous BID  . insulin  aspart  0-15 Units Subcutaneous TID WC  . insulin aspart  0-5 Units Subcutaneous QHS  . isosorbide mononitrate  30 mg Oral Daily  . LORazepam  0-4 mg Intravenous Q6H   Followed by  . [START ON 09/09/2015] LORazepam  0-4 mg Intravenous Q12H  . multivitamin with minerals  1 tablet Oral Daily  . pravastatin  10 mg Oral q1800  . sertraline  100 mg Oral QHS  . sodium chloride flush  3 mL Intravenous Q12H  . thiamine  100 mg Oral Daily   Or  . thiamine  100 mg Intravenous Daily  . traZODone  100 mg Oral QHS   Continuous Infusions:    LOS: 1 day    Time spent: 35 minutes.     Alba Cory, MD [facility] Pager [phone]  If 7PM-7AM, please contact night-coverage [email or web address] Password [email or web address] 09/08/2015, 7:45 AM

## 2015-09-08 NOTE — Progress Notes (Signed)
Completed medication history but I'm not sure how of the accuracy as Mr. Piraino is quite drowsy. He gets his medications at the [facility] in [place] and it is difficult to get information from them. He hasn't taken any of his medications for approximately one month.   Charolotte Eke, PharmD, pager [phone]. 09/08/2015,11:11 AM.

## 2015-09-09 ENCOUNTER — Inpatient Hospital Stay (HOSPITAL_COMMUNITY): Payer: Medicare Other

## 2015-09-09 DIAGNOSIS — E119 Type 2 diabetes mellitus without complications: Secondary | ICD-10-CM

## 2015-09-09 DIAGNOSIS — R748 Abnormal levels of other serum enzymes: Secondary | ICD-10-CM

## 2015-09-09 DIAGNOSIS — Z9114 Patient's other noncompliance with medication regimen: Secondary | ICD-10-CM

## 2015-09-09 DIAGNOSIS — Z794 Long term (current) use of insulin: Secondary | ICD-10-CM

## 2015-09-09 LAB — CBC
HCT: 38.7 % — ABNORMAL LOW (ref 39.0–52.0)
HEMOGLOBIN: 13.2 g/dL (ref 13.0–17.0)
MCH: 29.7 pg (ref 26.0–34.0)
MCHC: 34.1 g/dL (ref 30.0–36.0)
MCV: 87.2 fL (ref 78.0–100.0)
PLATELETS: 213 10*3/uL (ref 150–400)
RBC: 4.44 MIL/uL (ref 4.22–5.81)
RDW: 12.2 % (ref 11.5–15.5)
WBC: 6.4 10*3/uL (ref 4.0–10.5)

## 2015-09-09 LAB — RAPID URINE DRUG SCREEN, HOSP PERFORMED
AMPHETAMINES: NOT DETECTED
BENZODIAZEPINES: POSITIVE — AB
Barbiturates: NOT DETECTED
Cocaine: POSITIVE — AB
Opiates: NOT DETECTED
TETRAHYDROCANNABINOL: NOT DETECTED

## 2015-09-09 LAB — URINALYSIS, ROUTINE W REFLEX MICROSCOPIC
GLUCOSE, UA: 250 mg/dL — AB
HGB URINE DIPSTICK: NEGATIVE
Ketones, ur: NEGATIVE mg/dL
LEUKOCYTES UA: NEGATIVE
Nitrite: NEGATIVE
PH: 5 (ref 5.0–8.0)
PROTEIN: NEGATIVE mg/dL
SPECIFIC GRAVITY, URINE: 1.022 (ref 1.005–1.030)

## 2015-09-09 LAB — BASIC METABOLIC PANEL
ANION GAP: 7 (ref 5–15)
BUN: 29 mg/dL — ABNORMAL HIGH (ref 6–20)
CHLORIDE: 104 mmol/L (ref 101–111)
CO2: 27 mmol/L (ref 22–32)
CREATININE: 1.49 mg/dL — AB (ref 0.61–1.24)
Calcium: 8.5 mg/dL — ABNORMAL LOW (ref 8.9–10.3)
GFR calc non Af Amer: 49 mL/min — ABNORMAL LOW (ref >60)
GFR, EST AFRICAN AMERICAN: 57 mL/min — AB (ref >60)
Glucose, Bld: 150 mg/dL — ABNORMAL HIGH (ref 65–99)
POTASSIUM: 4 mmol/L (ref 3.5–5.1)
SODIUM: 138 mmol/L (ref 135–145)

## 2015-09-09 LAB — GLUCOSE, CAPILLARY
GLUCOSE-CAPILLARY: 223 mg/dL — AB (ref 65–99)
GLUCOSE-CAPILLARY: 83 mg/dL (ref 65–99)
Glucose-Capillary: 225 mg/dL — ABNORMAL HIGH (ref 65–99)
Glucose-Capillary: 328 mg/dL — ABNORMAL HIGH (ref 65–99)

## 2015-09-09 MED ORDER — GUAIFENESIN ER 600 MG PO TB12
600.0000 mg | ORAL_TABLET | Freq: Two times a day (BID) | ORAL | Status: DC
  Administered 2015-09-09 – 2015-09-16 (×15): 600 mg via ORAL
  Filled 2015-09-09 (×15): qty 1

## 2015-09-09 MED ORDER — SODIUM CHLORIDE 0.9 % IV BOLUS (SEPSIS): 250.0000 mL | Freq: Once | INTRAVENOUS | Status: DC | PRN

## 2015-09-09 MED ORDER — CARVEDILOL 3.125 MG PO TABS
3.1250 mg | ORAL_TABLET | Freq: Two times a day (BID) | ORAL | Status: DC
  Administered 2015-09-09 – 2015-09-13 (×8): 3.125 mg via ORAL
  Filled 2015-09-09 (×8): qty 1

## 2015-09-09 NOTE — Progress Notes (Signed)
Inpatient Diabetes Program Recommendations  AACE/ADA: New Consensus Statement on Inpatient Glycemic Control (2015)  Target Ranges:  Prepandial:   less than 140 mg/dL      Peak postprandial:   less than 180 mg/dL (1-2 hours)      Critically ill patients:  140 - 180 mg/dL   Review of Glycemic Control  Results for SARGENT, GAPP (MRN [id number]) as of 09/09/2015 15:06  Ref. Range 09/08/2015 22:17 09/09/2015 08:06 09/09/2015 11:45  Glucose-Capillary Latest Ref Range: 65-99 mg/dL 782 (H) 956 (H) 213 (H)   Last HgbA1C - 10.5% in Nov 2016.   Inpatient Diabetes Program Recommendations:    Add Lantus 10 units QHS May benefit from meal coverage insulin - Novolog 3 units tidwc HgbA1C pending.  Will continue to follow. Thank you. Ailene Ards, RD, LDN, CDE Inpatient Diabetes Coordinator [phone]

## 2015-09-09 NOTE — Progress Notes (Signed)
PROGRESS NOTE    Thomas ANTILLA  [name]:[id number] DOB: 12/15/1955 DOA: 09/07/2015 PCP: [facility]    Brief Narrative: [name] is a 60 y.o. male with medical history significant of chronic combined systolic and diastolic congestive heart failure, last transthoracic echocardiogram performed on 03/10/2015 that showed an EF of 30-35% with grade 3 diastolic dysfunction, history of hypertension, insulin-dependent diabetes mellitus, coronary artery disease, as well as having ongoing alcohol abuse as well as history of cocaine use. He presents to the emergency room, intoxicated, reports last drinking the  Morning of admission. He came in with complaints of shortness of breath becoming progressively worse over the past week.  Patient admitted with acute on chronic combine Heart failure exacerbation, he was treated for the same with IV lasix. He was started on CIWA due to high risk for alcohol withdrawal.      Assessment & Plan:   Principal Problem:   Acute on chronic combined systolic (congestive) and diastolic (congestive) heart failure (HCC) Active Problems:   Diabetes mellitus, type II (HCC)   Hypertension   Cardiomyopathy, nonischemic (HCC)   ETOH abuse   Alcohol dependence with withdrawal delirium (HCC)   CHF (congestive heart failure) (HCC)  Acute on chronic combined systolic and diastolic congestive heart failure Last ECHO ; 03/10/2015 that showed an EF of 30-35% with grade 3 diastolic dysfunction s/p IV Lasix 40 mg twice daily since admission, lasix d/ced on 5/17 due to elevation of cr Negative 2.2 L. No edema, no room air  lisinopril, imdur held , coreg dose reduced due to soft bp and elevation of cr.   Cr elevation: from overdiuresing? Hold lasix,   Cough, ct chest pending  Chronic alcohol abuse; reported history of seizure from alcohol withdrawal, at risk for DT. CIWA protocol.   Type 2 diabetes mellitus, insulin-dependent, metformin held, on ssi here, a1c  pending  Hypertension: lisinopril, imdur held and coreg dose resuded due to soft SBP   History of coronary artery disease. Currently denies chest pain, troponin negative at 0.08 in the emergency department. Place him on continuous cardiac monitoring. Last cath in 2014 moderate nonobstructive, nonischemic cardiomyopathy, patient has not been following up with cardiology  Depression;  unclear if he has been taking Zoloft, Wellbutrin, hold to avoid sedation.   Substance abuse; reported continued drink heavily and use drug, urine tox screen pending, reported substance abuse due to stress at home, advise patent to seem family counseling by pmd at [facility].  DVT prophylaxis: Lovenox Code Status: Full code.  Family Communication: care discussed with patient  Disposition Plan: out of stepdown to med tele. Monitor sign of alcohol withdrawal  Home when stable, might benefit from Rehab program.    Consultants:   none  Procedures:   none  Antimicrobials:   none   Subjective: He is aaox3 this am, He relates dyspnea has improved. , remain productive cough, no fever, no chest pain No edema, no room air.  Objective: Filed Vitals:   09/09/15 0300 09/09/15 0400 09/09/15 0500 09/09/15 0800  BP: 91/63 90/62  109/82  Pulse: 78 73  86  Temp:  98.3 F (36.8 C)  97.8 F (36.6 C)  TempSrc:  Axillary  Oral  Resp: 20 17  21   Height:      Weight:   73.4 kg (161 lb 13.1 oz)   SpO2: 94% 93%  97%    Intake/Output Summary (Last 24 hours) at 09/09/15 0850 Last data filed at 09/08/15 2200  Gross per  24 hour  Intake    483 ml  Output   1000 ml  Net   -517 ml   Filed Weights   09/07/15 1736 09/08/15 0500 09/09/15 0500  Weight: 71.8 kg (158 lb 4.6 oz) 73.8 kg (162 lb 11.2 oz) 73.4 kg (161 lb 13.1 oz)    Examination:  General exam: Appears calm and comfortable  Respiratory system: Respiratory effort normal. Bilateral crackles. No wheezing, no rhonchi Cardiovascular system: S1 & S2 heard, RRR. No  pedal edema. Gastrointestinal system: Abdomen is nondistended, soft and nontender. No organomegaly or masses felt. Normal bowel sounds heard. Central nervous system: aaox3, no focal weakness, no asterixis Extremities: Symmetric 5 x 5 power. Skin: No rashes, lesions or ulcers     Data Reviewed: I have personally reviewed following labs and imaging studies  CBC:  Recent Labs Lab 09/07/15 0805 09/09/15 0317  WBC 6.3 6.4  NEUTROABS 3.4  --   HGB 15.2 13.2  HCT 43.2 38.7*  MCV 85.9 87.2  PLT 209 213   Basic Metabolic Panel:  Recent Labs Lab 09/07/15 0805 09/08/15 0304 09/09/15 0317  NA 135 140 138  K 4.0 3.7 4.0  CL 102 106 104  CO2 22 23 27   GLUCOSE 335* 130* 150*  BUN 9 18 29*  CREATININE 0.91 1.01 1.49*  CALCIUM 8.8* 8.7* 8.5*  MG  --  1.9  --    GFR: Estimated Creatinine Clearance: 54.1 mL/min (by C-G formula based on Cr of 1.49). Liver Function Tests:  Recent Labs Lab 09/07/15 0805  AST 55*  ALT 42  ALKPHOS 85  BILITOT 1.4*  PROT 7.0  ALBUMIN 3.1*   No results for input(s): LIPASE, AMYLASE in the last 168 hours. No results for input(s): AMMONIA in the last 168 hours. Coagulation Profile: No results for input(s): INR, PROTIME in the last 168 hours. Cardiac Enzymes: No results for input(s): CKTOTAL, CKMB, CKMBINDEX, TROPONINI in the last 168 hours. BNP (last 3 results) No results for input(s): PROBNP in the last 8760 hours. HbA1C: No results for input(s): HGBA1C in the last 72 hours. CBG:  Recent Labs Lab 09/08/15 0820 09/08/15 1225 09/08/15 1617 09/08/15 2217 09/09/15 0806  GLUCAP 106* 249* 173* 279* 225*   Lipid Profile: No results for input(s): CHOL, HDL, LDLCALC, TRIG, CHOLHDL, LDLDIRECT in the last 72 hours. Thyroid Function Tests: No results for input(s): TSH, T4TOTAL, FREET4, T3FREE, THYROIDAB in the last 72 hours. Anemia Panel: No results for input(s): VITAMINB12, FOLATE, FERRITIN, TIBC, IRON, RETICCTPCT in the last 72  hours. Sepsis Labs: No results for input(s): PROCALCITON, LATICACIDVEN in the last 168 hours.  Recent Results (from the past 240 hour(s))  MRSA PCR Screening     Status: None   Collection Time: 09/07/15  5:28 PM  Result Value Ref Range Status   MRSA by PCR NEGATIVE NEGATIVE Final    Comment:        The GeneXpert MRSA Assay (FDA approved for NASAL specimens only), is one component of a comprehensive MRSA colonization surveillance program. It is not intended to diagnose MRSA infection nor to guide or monitor treatment for MRSA infections.          Radiology Studies: No results found.      Scheduled Meds: . antiseptic oral rinse  7 mL Mouth Rinse BID  . aspirin EC  81 mg Oral Daily  . carvedilol  3.125 mg Oral BID WC  . enoxaparin (LOVENOX) injection  40 mg Subcutaneous Q24H  . folic acid  1 mg Oral Daily  . guaiFENesin  600 mg Oral BID  . insulin aspart  0-15 Units Subcutaneous TID WC  . insulin aspart  0-5 Units Subcutaneous QHS  . LORazepam  0-4 mg Intravenous Q6H   Followed by  . LORazepam  0-4 mg Intravenous Q12H  . multivitamin with minerals  1 tablet Oral Daily  . pravastatin  10 mg Oral q1800  . sodium chloride flush  3 mL Intravenous Q12H  . thiamine  100 mg Oral Daily   Or  . thiamine  100 mg Intravenous Daily  . traZODone  100 mg Oral QHS   Continuous Infusions:    LOS: 2 days    Time spent: 35 minutes.     Albertine Grates, MD PhD [facility] Pager [phone]  If 7PM-7AM, please contact night-coverage [email or web address] Password [email or web address] 09/09/2015, 8:50 AM

## 2015-09-10 ENCOUNTER — Inpatient Hospital Stay (HOSPITAL_COMMUNITY): Payer: Medicare Other

## 2015-09-10 DIAGNOSIS — I429 Cardiomyopathy, unspecified: Secondary | ICD-10-CM

## 2015-09-10 DIAGNOSIS — I5043 Acute on chronic combined systolic (congestive) and diastolic (congestive) heart failure: Secondary | ICD-10-CM

## 2015-09-10 DIAGNOSIS — I509 Heart failure, unspecified: Secondary | ICD-10-CM

## 2015-09-10 DIAGNOSIS — F1010 Alcohol abuse, uncomplicated: Secondary | ICD-10-CM

## 2015-09-10 LAB — ECHOCARDIOGRAM COMPLETE
Height: 74 in
Weight: 2576 oz

## 2015-09-10 LAB — GLUCOSE, CAPILLARY
GLUCOSE-CAPILLARY: 293 mg/dL — AB (ref 65–99)
GLUCOSE-CAPILLARY: 126 mg/dL — AB (ref 65–99)
Glucose-Capillary: 198 mg/dL — ABNORMAL HIGH (ref 65–99)
Glucose-Capillary: 349 mg/dL — ABNORMAL HIGH (ref 65–99)

## 2015-09-10 LAB — COMPREHENSIVE METABOLIC PANEL
ALBUMIN: 2.5 g/dL — AB (ref 3.5–5.0)
ALK PHOS: 82 U/L (ref 38–126)
ALT: 35 U/L (ref 17–63)
AST: 50 U/L — AB (ref 15–41)
Anion gap: 7 (ref 5–15)
BILIRUBIN TOTAL: 1 mg/dL (ref 0.3–1.2)
BUN: 25 mg/dL — AB (ref 6–20)
CALCIUM: 7.9 mg/dL — AB (ref 8.9–10.3)
CO2: 23 mmol/L (ref 22–32)
CREATININE: 1.09 mg/dL (ref 0.61–1.24)
Chloride: 103 mmol/L (ref 101–111)
GFR calc Af Amer: >60 mL/min (ref >60)
GLUCOSE: 231 mg/dL — AB (ref 65–99)
Potassium: 3.6 mmol/L (ref 3.5–5.1)
Sodium: 133 mmol/L — ABNORMAL LOW (ref 135–145)
TOTAL PROTEIN: 6 g/dL — AB (ref 6.5–8.1)

## 2015-09-10 LAB — MAGNESIUM: MAGNESIUM: 1.8 mg/dL (ref 1.7–2.4)

## 2015-09-10 MED ORDER — ZOLPIDEM TARTRATE 5 MG PO TABS: 5.0000 mg | ORAL_TABLET | Freq: Once | ORAL | Status: DC | PRN

## 2015-09-10 MED ORDER — ACETAMINOPHEN 325 MG PO TABS
650.0000 mg | ORAL_TABLET | Freq: Four times a day (QID) | ORAL | Status: DC | PRN
  Administered 2015-09-15: 650 mg via ORAL
  Filled 2015-09-10: qty 2

## 2015-09-10 MED ORDER — POTASSIUM CHLORIDE CRYS ER 20 MEQ PO TBCR
40.0000 meq | EXTENDED_RELEASE_TABLET | Freq: Once | ORAL | Status: AC
  Administered 2015-09-10: 40 meq via ORAL
  Filled 2015-09-10: qty 2

## 2015-09-10 MED ORDER — FUROSEMIDE 10 MG/ML IJ SOLN
40.0000 mg | Freq: Every day | INTRAMUSCULAR | Status: DC
  Administered 2015-09-10 – 2015-09-11 (×2): 40 mg via INTRAVENOUS
  Filled 2015-09-10 (×2): qty 4

## 2015-09-10 NOTE — Progress Notes (Signed)
PROGRESS NOTE    [name]  LKT:[id number] DOB: 14-Nov-1955 DOA: 09/07/2015 PCP: [facility]    Brief Narrative: [name] is a 60 y.o. male with medical history significant of chronic combined systolic and diastolic congestive heart failure, last transthoracic echocardiogram performed on 03/10/2015 that showed an EF of 30-35% with grade 3 diastolic dysfunction, history of hypertension, insulin-dependent diabetes mellitus, coronary artery disease, as well as having ongoing alcohol abuse as well as history of cocaine use. He presents to the emergency room, intoxicated, reports last drinking the  Morning of admission. He came in with complaints of shortness of breath becoming progressively worse over the past week.  Patient admitted with acute on chronic combine Heart failure exacerbation, he was treated for the same with IV lasix. He was started on CIWA due to high risk for alcohol withdrawal.      Assessment & Plan:   Principal Problem:   Acute on chronic combined systolic (congestive) and diastolic (congestive) heart failure (HCC) Active Problems:   Diabetes mellitus, type II (HCC)   Hypertension   Cardiomyopathy, nonischemic (HCC)   ETOH abuse   Alcohol dependence with withdrawal delirium (HCC)   CHF (congestive heart failure) (HCC)  Acute on chronic combined systolic and diastolic congestive heart failure Last ECHO ; 03/10/2015 that showed an EF of 30-35% with grade 3 diastolic dysfunction s/p IV Lasix 40 mg twice daily since admission, lasix d/ced on 5/17 due to elevation of cr Negative 2.2 L. No edema, no room air  lisinopril, imdur held , coreg dose reduced due to soft bp and elevation of cr.  Repeat echo pending,Cardiology cnsulted  Cr elevation: from overdiuresing? Lasix held on 5/17, resumed on 5/18  Cough, ct chest + moderate pleural effusion bilaterally with extensive lower lobe airspace consolidation, + atherosclerotic calcification. Currently no  leukocytosis, no fever, on mucinex,  Provide mucinex, restart lasix, will monitor  Chronic alcohol abuse; reported history of seizure from alcohol withdrawal, at risk for DT. CIWA protocol.   Type 2 diabetes mellitus, insulin-dependent, metformin held, on ssi here, a1c pending  Hypertension: lisinopril, imdur held and coreg dose resuded due to soft SBP   History of coronary artery disease. Currently denies chest pain, troponin negative at 0.08 in the emergency department. Place him on continuous cardiac monitoring. Last cath in 2014 moderate nonobstructive, nonischemic cardiomyopathy, patient has not been following up with cardiology  Depression;  unclear if he has been taking Zoloft, Wellbutrin, hold to avoid sedation.   Substance abuse; reported continued drink heavily and use drug, urine tox screen pending, reported substance abuse due to stress at home, advise patent to seem family counseling by pmd at [place].  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  Family Communication: care discussed with patient  Disposition Plan: out of stepdown to med tele. Monitor sign of alcohol withdrawal  Home when stable, might benefit from alcohol Rehab program.    Consultants:   cardiology  Procedures:   none  Antimicrobials:   none   Subjective: Has an episode of sob was put on oxygen supplement, currently reported dyspnea has improved, remain productive cough, no fever, no chest pain No edema,  No overt sign of alcohol withdrawal  Objective: Filed Vitals:   09/09/15 1114 09/09/15 1347 09/09/15 2231 09/10/15 0612  BP: 90/64 114/78 114/85 102/79  Pulse:  78 78 81 80  Temp: 98.6 F (37 C) 98.4 F (36.9 C) 97.9 F (36.6 C) 98.5 F (36.9 C)  TempSrc: Oral Oral Oral Oral  Resp: 20 20 20 20   Height:      Weight:    73.029 kg (161 lb)    SpO2: 96% 97% 96% 98%    Intake/Output Summary (Last 24 hours) at 09/10/15 0827 Last data filed at 09/10/15 1610  Gross per 24 hour  Intake    600 ml  Output    950 ml  Net   -350 ml   Filed Weights   09/08/15 0500 09/09/15 0500 09/10/15 0612  Weight: 73.8 kg (162 lb 11.2 oz) 73.4 kg (161 lb 13.1 oz) 73.029 kg (161 lb)    Examination:  General exam: Appears calm and comfortable  Respiratory system: Respiratory effort normal. Bilateral crackles. No wheezing, no rhonchi Cardiovascular system: S1 & S2 heard, RRR. No pedal edema. Gastrointestinal system: Abdomen is nondistended, soft and nontender. No organomegaly or masses felt. Normal bowel sounds heard. Central nervous system: aaox3, no focal weakness, no asterixis Extremities: Symmetric 5 x 5 power. Skin: No rashes, lesions or ulcers     Data Reviewed: I have personally reviewed following labs and imaging studies  CBC:  Recent Labs Lab 09/07/15 0805 09/09/15 0317  WBC 6.3 6.4  NEUTROABS 3.4  --   HGB 15.2 13.2  HCT 43.2 38.7*  MCV 85.9 87.2  PLT 209 213   Basic Metabolic Panel:  Recent Labs Lab 09/07/15 0805 09/08/15 0304 09/09/15 0317 09/10/15 0516  NA 135 140 138 133*  K 4.0 3.7 4.0 3.6  CL 102 106 104 103  CO2 22 23 27 23   GLUCOSE 335* 130* 150* 231*  BUN 9 18 29* 25*  CREATININE 0.91 1.01 1.49* 1.09  CALCIUM 8.8* 8.7* 8.5* 7.9*  MG  --  1.9  --  1.8   GFR: Estimated Creatinine Clearance: 73.5 mL/min (by C-G formula based on Cr of 1.09). Liver Function Tests:  Recent Labs Lab 09/07/15 0805 09/10/15 0516  AST 55* 50*  ALT 42 35  ALKPHOS 85 82  BILITOT 1.4* 1.0  PROT 7.0 6.0*  ALBUMIN 3.1* 2.5*   No results for input(s): LIPASE, AMYLASE in the last 168 hours. No results for input(s): AMMONIA in the last 168 hours. Coagulation Profile: No results for input(s): INR, PROTIME in the last 168 hours. Cardiac Enzymes: No results for input(s): CKTOTAL, CKMB, CKMBINDEX, TROPONINI in the last  168 hours. BNP (last 3 results) No results for input(s): PROBNP in the last 8760 hours. HbA1C: No results for input(s): HGBA1C in the last 72 hours. CBG:  Recent Labs Lab 09/08/15 2217 09/09/15 0806 09/09/15 1145 09/09/15 1738 09/09/15 2228  GLUCAP 279* 225* 223* 83 328*   Lipid Profile: No results for input(s): CHOL, HDL, LDLCALC, TRIG, CHOLHDL, LDLDIRECT in the last 72 hours. Thyroid Function Tests: No results for input(s): TSH, T4TOTAL, FREET4, T3FREE, THYROIDAB in the last 72 hours. Anemia Panel: No results for input(s): VITAMINB12, FOLATE, FERRITIN, TIBC, IRON, RETICCTPCT in the last 72 hours. Sepsis Labs: No results for input(s): PROCALCITON, LATICACIDVEN in the last 168 hours.  Recent Results (from the past 240 hour(s))  MRSA PCR Screening     Status: None   Collection Time: 09/07/15  5:28 PM  Result Value Ref Range Status   MRSA by PCR NEGATIVE NEGATIVE Final    Comment:        The GeneXpert MRSA Assay (FDA approved for  NASAL specimens only), is one component of a comprehensive MRSA colonization surveillance program. It is not intended to diagnose MRSA infection nor to guide or monitor treatment for MRSA infections.          Radiology Studies: Ct Chest Wo Contrast  09/09/2015  CLINICAL DATA:  Progressive shortness of breath for 1 week EXAM: CT CHEST WITHOUT CONTRAST TECHNIQUE: Multidetector CT imaging of the chest was performed following the standard protocol without IV contrast. COMPARISON:  Chest radiograph Sep 07, 2015 FINDINGS: Mediastinum/Lymph Nodes: Thyroid appears unremarkable. There are multiple small lymph nodes throughout the mediastinum. There are mildly enlarged lymph nodes anterior to the carina, largest lymph node measuring 1.7 x 1.4 cm. There are borderline prominent lymph nodes in the hila ; the absence of intravenous contrast makes delineation of lymph nodes from adjacent vessels difficult in this circumstance. There is prominence of the  ascending aorta with a maximum transverse diameter of 4.4 x 4.0 cm. The visualized great vessels appear unremarkable on this noncontrast enhanced study except for mild calcification at the origins of the left and right common carotid arteries. There are foci of atherosclerotic calcification in the aorta. There are multiple foci of coronary artery calcification. The pericardium is not thickened. Lungs/Pleura: There are sizable free-flowing pleural effusions bilaterally. There is bibasilar airspace consolidation. There is patchy infiltrate in the posterior segment of the left upper lobe. There are areas of bronchiectatic change in the upper lobes bilaterally. No interstitial edema is evident. Upper abdomen: In the visualized upper abdomen, atherosclerotic calcification is noted in the aorta and left renal artery. Visualized upper abdominal structures otherwise appear unremarkable. Musculoskeletal: There is degenerative change in the thoracic spine. There are no blastic or lytic bone lesions. IMPRESSION: Moderate free-flowing pleural effusions bilaterally with extensive lower lobe airspace consolidation bilaterally. Patchy infiltrate is noted in the posterior segment of the left upper lobe. No focal interstitial edema appreciable. Atherosclerotic calcification is noted in the aorta with multiple foci of coronary artery calcification noted. Prominence of the ascending thoracic aorta with a maximum transverse diameter 4.4 x 4.0 cm. Recommend annual imaging followup by CTA or MRA. This recommendation follows 2010 ACCF/AHA/AATS/ACR/ASA/SCA/SCAI/SIR/STS/SVM Guidelines for the Diagnosis and Management of Patients with Thoracic Aortic Disease. Circulation. 2010; 121: J478-G956. Mildly prominent lymph nodes, likely reactive in etiology. Electronically Signed   By: Bretta Bang III M.D.   On: 09/09/2015 14:27        Scheduled Meds: . antiseptic oral rinse  7 mL Mouth Rinse BID  . aspirin EC  81 mg Oral Daily  .  carvedilol  3.125 mg Oral BID WC  . enoxaparin (LOVENOX) injection  40 mg Subcutaneous Q24H  . folic acid  1 mg Oral Daily  . guaiFENesin  600 mg Oral BID  . insulin aspart  0-15 Units Subcutaneous TID WC  . insulin aspart  0-5 Units Subcutaneous QHS  . LORazepam  0-4 mg Intravenous Q12H  . multivitamin with minerals  1 tablet Oral Daily  . pravastatin  10 mg Oral q1800  . sodium chloride flush  3 mL Intravenous Q12H  . thiamine  100 mg Oral Daily   Or  . thiamine  100 mg Intravenous Daily  . traZODone  100 mg Oral QHS   Continuous Infusions:    LOS: 3 days    Time spent: 25 minutes.     Albertine Grates, MD PhD [facility] Pager [phone]  If 7PM-7AM, please contact night-coverage [email or web address] Password [facility] 09/10/2015, 8:27 AM

## 2015-09-10 NOTE — Progress Notes (Signed)
  Echocardiogram 2D Echocardiogram has been performed.  Leta Jungling M 09/10/2015, 1:57 PM

## 2015-09-10 NOTE — Care Management Note (Signed)
Case Management Note  Patient Details  Name: MURAD PAMPHILE MRN: [id number] Date of Birth: December 30, 1955  Subjective/Objective:CHF improving. Iv lasix, abd u/s.                    Action/Plan:d/c plan home.   Expected Discharge Date:   (UNKNOWN)               Expected Discharge Plan:  Home/Self Care  In-House Referral:     Discharge planning Services  CM Consult  Post Acute Care Choice:    Choice offered to:     DME Arranged:    DME Agency:     HH Arranged:    HH Agency:     Status of Service:  In process, will continue to follow  Medicare Important Message Given:  Yes Date Medicare IM Given:    Medicare IM give by:    Date Additional Medicare IM Given:    Additional Medicare Important Message give by:     If discussed at Long Length of Stay Meetings, dates discussed:    Additional Comments:  Lanier Clam, RN 09/10/2015, 3:21 PM

## 2015-09-10 NOTE — Consult Note (Signed)
CARDIOLOGY CONSULT NOTE   Patient ID: JASN XIA MRN: [id number] DOB/AGE: [birth year] 60 y.o.  Admit date: 09/07/2015  Requesting Physician: [name] Primary Physician:   [facility] Primary Cardiologist:  previously seen by Dr. McLean/Dr. Clifton James Reason for Consultation:  CHF  HPI: [name] is a 60 y.o. 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   He was admitted in 2014 with dyspnea and found to have segmental LV systolic dysfunction on ECHO. He was taken back for [facility] which showed mod non obst CAD and NICM.   2D ECHO 02/2015 with EF 30-35%, diffuse HK, G3DD, mild MR, mild LAE, PA pressure 39.   He presented to the emergency room, intoxicated, reports last drinking themorning of admission and cocaine the day previous. He came in with complaints of shortness of breath becoming progressively worse over the past week. He has been using drugs and alcohol daily for the past year. He was able to have 5 years sober in the past but has had stressors at home with his mother and girlfriend who do not get along. He denies chest pain. He was initially feeling better in terms of SOB until today when he became more dyspneic and was placed on 02. He has had no chest pain. No LE edema, orthopnea or PND currently. No dizziness or syncope. Patient a difficult historian.   ED Course: In the emergency department had a BNP of 1076, chest x-ray showing evidence of pulmonary edema. He was given 60 mg of IV Lasix.   Past Medical History  Diagnosis Date  . Diabetes mellitus   . Hypertension   . Coronary artery disease   . MI (myocardial infarction) (HCC) 82    age 56 per patient  . Mental disorder   . Depression      Past Surgical History  Procedure Laterality Date  . Angioplasty  1984    at age 10  . Left and right heart  catheterization with coronary/graft angiogram  08/13/2012    Procedure: LEFT AND RIGHT HEART CATHETERIZATION WITH [name];  Surgeon: Kathleene Hazel, MD;  Location: [facility] CATH LAB;  Service: Cardiovascular;;    No Known Allergies  I have reviewed the patient's current medications . antiseptic oral rinse  7 mL Mouth Rinse BID  . aspirin EC  81 mg Oral Daily  . carvedilol  3.125 mg Oral BID WC  . enoxaparin (LOVENOX) injection  40 mg Subcutaneous Q24H  . folic acid  1 mg Oral Daily  . furosemide  40 mg Intravenous Daily  . guaiFENesin  600 mg Oral BID  . insulin aspart  0-15 Units Subcutaneous TID WC  . insulin aspart  0-5 Units Subcutaneous QHS  . LORazepam  0-4 mg Intravenous Q12H  . multivitamin with minerals  1 tablet Oral Daily  . pravastatin  10 mg Oral q1800  . sodium chloride flush  3 mL Intravenous Q12H  . thiamine  100 mg Oral Daily   Or  . thiamine  100 mg Intravenous Daily  . traZODone  100 mg Oral QHS     sodium chloride, acetaminophen, LORazepam **OR** LORazepam, ondansetron (ZOFRAN) IV, sodium chloride, sodium chloride flush  Prior to Admission medications   Medication Sig Start Date End Date Taking? Authorizing Provider  aspirin 81 MG tablet Take 81 mg by mouth daily.  Yes Historical Provider, MD  buPROPion (WELLBUTRIN XL) 150 MG 24 hr tablet Take 150 mg by mouth daily.   Yes Historical Provider, MD  carvedilol (COREG) 6.25 MG tablet Take 1 tablet (6.25 mg total) by mouth 2 (two) times daily with a meal. 02/27/14  Yes Christina P Rama, MD  feeding supplement, GLUCERNA SHAKE, (GLUCERNA SHAKE) LIQD Take 237 mLs by mouth 3 (three) times daily between meals. 02/27/14  Yes Maryruth Bun Rama, MD  folic acid (FOLVITE) 1 MG tablet Take 1 tablet (1 mg total) by mouth daily. 02/27/14  Yes Christina P Rama, MD  furosemide (LASIX) 40 MG tablet Take 1 tablet (40 mg total) by mouth daily. 02/27/14  Yes Maryruth Bun Rama, MD  Insulin Glargine (LANTUS SOLOSTAR) 100  UNIT/ML Solostar Pen Inject 45 Units into the skin every morning. Patient taking differently: Inject 40 Units into the skin every morning. Holds if CBG <200 03/13/15  Yes Calvert Cantor, MD  lisinopril (PRINIVIL,ZESTRIL) 20 MG tablet Take 20 mg by mouth daily.   Yes Historical Provider, MD  metFORMIN (GLUCOPHAGE) 1000 MG tablet Take 0.5 tablets (500 mg total) by mouth 2 (two) times daily with a meal. For high blood sugar control Patient taking differently: Take 1,000 mg by mouth 2 (two) times daily with a meal. For high blood sugar control 12/27/12  Yes Sanjuana Kava, NP  Multiple Vitamin (MULTIVITAMIN WITH MINERALS) TABS tablet Take 1 tablet by mouth daily. 02/27/14  Yes Maryruth Bun Rama, MD  potassium chloride 20 MEQ TBCR Take 20 mEq by mouth daily. For low potassium supplement 02/27/14  Yes Christina P Rama, MD  pravastatin (PRAVACHOL) 10 MG tablet Take 1 tablet (10 mg total) by mouth daily. For high cholesterol control 12/27/12  Yes Sanjuana Kava, NP  sertraline (ZOLOFT) 100 MG tablet Take 1 tablet (100 mg total) by mouth at bedtime. For depression 12/27/12  Yes Sanjuana Kava, NP  traZODone (DESYREL) 100 MG tablet Take 1 tablet (100 mg total) by mouth at bedtime. For sleep 12/27/12  Yes Sanjuana Kava, NP  insulin aspart (NOVOLOG FLEXPEN) 100 UNIT/ML FlexPen Inject 12 Units into the skin 3 (three) times daily with meals. Patient not taking: Reported on 09/08/2015 03/12/15   Calvert Cantor, MD  thiamine 100 MG tablet Take 1 tablet (100 mg total) by mouth daily. Patient not taking: Reported on 09/08/2015 02/27/14   Maryruth Bun Rama, MD     Social History   Social History  . Marital Status: Widowed    Spouse Name: N/A  . Number of Children: N/A  . Years of Education: N/A   Occupational History  . Not on file.   Social History Main Topics  . Smoking status: Current Every Day Smoker -- 0.50 packs/day    Types: Cigarettes  . Smokeless tobacco: Never Used  . Alcohol Use: 1.2 oz/week    2 Cans of beer per  week     Comment: drinking daily (2 40oz beer a day)  . Drug Use: 7.00 per week    Special: Cocaine, Marijuana     Comment: last use about 6 months ago  . Sexual Activity: Yes   Other Topics Concern  . Not on file   Social History Narrative    No family status information on file.   Family History  Problem Relation Age of Onset  . Heart attack Father     >60s  . Hypertension Father   . Diabetes Father   . Heart attack Sister   .  Hypertension Mother      ROS:  Full 14 point review of systems complete and found to be negative unless listed above.  Physical Exam: Blood pressure 102/79, pulse 80, temperature 98.5 F (36.9 C), temperature source Oral, resp. rate 20, height  (1.88 m), weight 161 lb (73.029 kg), SpO2 98 %.  General: Well developed, well nourished, male in no acute distress. chroinally ill appearing, thin AA male Head: Eyes PERRLA, No xanthomas.   Normocephalic and atraumatic, oropharynx without edema or exudate.  Lungs: decreased breath sounds at bases Heart: HRRR S1 S2, no rub/gallop, Heart regular rate and rhythm with S1, S2  murmur. pulses are 2+ extrem.   Neck: No carotid bruits. No lymphadenopathy. No JVD. Abdomen: Bowel sounds present, abdomen soft and non-tender without masses or hernias noted. Msk:  No spine or cva tenderness. No weakness, no joint deformities or effusions. Extremities: No clubbing or cyanosis. No LE  edema.  Neuro: Alert and oriented X 3. No focal deficits noted. Psych:  Good affect, responds appropriately Skin: No rashes or lesions noted.  Labs:   Lab Results  Component Value Date   WBC 6.4 09/09/2015   HGB 13.2 09/09/2015   HCT 38.7* 09/09/2015   MCV 87.2 09/09/2015   PLT 213 09/09/2015   No results for input(s): INR in the last 72 hours.  Recent Labs Lab 09/10/15 0516  NA 133*  K 3.6  CL 103  CO2 23  BUN 25*  CREATININE 1.09  CALCIUM 7.9*  PROT 6.0*  BILITOT 1.0  ALKPHOS 82  ALT 35  AST 50*  GLUCOSE 231*    ALBUMIN 2.5*   MAGNESIUM  Date Value Ref Range Status  09/10/2015 1.8 1.7 - 2.4 mg/dL Final   Echo: Study Date: 03/10/2015 LV EF: 30% - 35% Study Conclusions - Left ventricle: The cavity size was normal. There was mild  hypertrophy of the posterior wall. Systolic function was normal.  The estimated ejection fraction was in the range of 30% to 35%.  Diffuse hypokinesis. Doppler parameters are consistent with a  reversible restrictive pattern, indicative of decreased left  ventricular diastolic compliance and/or increased left atrial  pressure (grade 3 diastolic dysfunction). Doppler parameters are  consistent with high ventricular filling pressure. - Mitral valve: There was mild regurgitation. - Left atrium: The atrium was mildly dilated. - Right ventricle: The cavity size was normal. Wall thickness was  normal. Systolic function was normal. RV systolic pressure (S,  est): 39 mm Hg. - Tricuspid valve: There was trivial regurgitation. - Pulmonary arteries: PA peak pressure: 39 mm Hg (S). - Inferior vena cava: The vessel was normal in size. The  respirophasic diameter changes were in the normal range (>= 50%),  consistent with normal central venous pressure. - Pericardium, extracardiac: There was a moderate-sized left  pleural effusion.  ECG:  Sinus tahcy with PVC with non specific ST TW changes.   Radiology:  Ct Chest Wo Contrast  09/09/2015  CLINICAL DATA:  Progressive shortness of breath for 1 week EXAM: CT CHEST WITHOUT CONTRAST TECHNIQUE: Multidetector CT imaging of the chest was performed following the standard protocol without IV contrast. COMPARISON:  Chest radiograph Sep 07, 2015 FINDINGS: Mediastinum/Lymph Nodes: Thyroid appears unremarkable. There are multiple small lymph nodes throughout the mediastinum. There are mildly enlarged lymph nodes anterior to the carina, largest lymph node measuring 1.7 x 1.4 cm. There are borderline prominent lymph nodes in the  hila ; the absence of intravenous contrast makes delineation of lymph nodes  from adjacent vessels difficult in this circumstance. There is prominence of the ascending aorta with a maximum transverse diameter of 4.4 x 4.0 cm. The visualized great vessels appear unremarkable on this noncontrast enhanced study except for mild calcification at the origins of the left and right common carotid arteries. There are foci of atherosclerotic calcification in the aorta. There are multiple foci of coronary artery calcification. The pericardium is not thickened. Lungs/Pleura: There are sizable free-flowing pleural effusions bilaterally. There is bibasilar airspace consolidation. There is patchy infiltrate in the posterior segment of the left upper lobe. There are areas of bronchiectatic change in the upper lobes bilaterally. No interstitial edema is evident. Upper abdomen: In the visualized upper abdomen, atherosclerotic calcification is noted in the aorta and left renal artery. Visualized upper abdominal structures otherwise appear unremarkable. Musculoskeletal: There is degenerative change in the thoracic spine. There are no blastic or lytic bone lesions. IMPRESSION: Moderate free-flowing pleural effusions bilaterally with extensive lower lobe airspace consolidation bilaterally. Patchy infiltrate is noted in the posterior segment of the left upper lobe. No focal interstitial edema appreciable. Atherosclerotic calcification is noted in the aorta with multiple foci of coronary artery calcification noted. Prominence of the ascending thoracic aorta with a maximum transverse diameter 4.4 x 4.0 cm. Recommend annual imaging followup by CTA or MRA. This recommendation follows 2010 ACCF/AHA/AATS/ACR/ASA/SCA/SCAI/SIR/STS/SVM Guidelines for the Diagnosis and Management of Patients with Thoracic Aortic Disease. Circulation. 2010; 121: Z610-R604. Mildly prominent lymph nodes, likely reactive in etiology. Electronically Signed   By: Bretta Bang III M.D.   On: 09/09/2015 14:27    ASSESSMENT AND PLAN:    Principal Problem:   Acute on chronic combined systolic (congestive) and diastolic (congestive) heart failure (HCC) Active Problems:   Diabetes mellitus, type II (HCC)   Hypertension   Cardiomyopathy, nonischemic (HCC)   ETOH abuse   Alcohol dependence with withdrawal delirium (HCC)   CHF (congestive heart failure) (HCC)  [name] is a 60 y.o. 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   Acute on chronic combined S/D CHF: 2D ECHO 02/2015 with EF 30-35%, diffuse HK, G3DD, mild MR, mild LAE, PA pressure 39. Repeat 2D ECHO pending -- BNP of 1076, chest x-ray showing evidence of pulmonary edema. -- Started on IV lasix. Currently on Lasix 40mg  daily. Net neg 2.2L. Weights are up and down but down at least 1 lb (132--> 161).  He was initially feeling better in terms of SOB until today when he became more dyspneic and was placed on 02.  -- Continue Coreg 3.125mg  BID and lisinopril held due to AKI and soft BPs. Not overtly volume overloaded on exam. Creat is improving with diuresis. Would continue IV lasix today.   Elevated troponin: 0.08--> 0.06. Low and flat c/w demand ischemia.   HTN: BPs soft. Lisinopril and imdur held.   Type 2 diabetes mellitus: metformin held, on ssi here, a1c pending  History of coronary artery disease: non obst on cath in 2014.   Polysubstance abuse: UDS + cocaine, benzos and was intoxicated on alcohol on admission. On CIWA protocol. Long term prognosis very poor if he cannot quit drinking and using drugs.   Non compliance: does not follow up as an outpatient and continues to abuse drugs and alcohol    Signed: Cline Crock, PA-C 09/10/2015 9:49 AM  Pager [phone]  Co-Sign MD  Patient  examined chart reviewed. Decomensated NIDCM.  Poor compliance  and drug use. Exam With bilateral effusions right worse than left. No murmur. No LE edema. Will increase lasix To iv bid. Still dyspnea. May need to consider throracentesis start on right side as he as a moderate Effusion with atelectasis of lung tissue.  Discused issue of drug abuse and need for cardiology F/u with Dr Shirlee Latch and Clifton James.  F/U CXR with left lateral decubitus in am Repeat echo pending To see if EF even worse than prior one of 30-35%   Charlton Haws

## 2015-09-10 NOTE — Care Management Important Message (Signed)
Important Message  Patient Details  Name: GIEZI BURBACH MRN: [id number] Date of Birth: 1955-07-10   Medicare Important Message Given:  Yes    Haskell Flirt 09/10/2015, 8:52 AMImportant Message  Patient Details  Name: SHARIFF CORNEAU MRN: [id number] Date of Birth: 02-02-1956   Medicare Important Message Given:  Yes    Haskell Flirt 09/10/2015, 8:52 AM

## 2015-09-11 ENCOUNTER — Inpatient Hospital Stay (HOSPITAL_COMMUNITY): Payer: Medicare Other

## 2015-09-11 DIAGNOSIS — J9601 Acute respiratory failure with hypoxia: Secondary | ICD-10-CM

## 2015-09-11 LAB — BASIC METABOLIC PANEL
ANION GAP: 5 (ref 5–15)
BUN: 24 mg/dL — ABNORMAL HIGH (ref 6–20)
CALCIUM: 8.1 mg/dL — AB (ref 8.9–10.3)
CHLORIDE: 102 mmol/L (ref 101–111)
CO2: 26 mmol/L (ref 22–32)
Creatinine, Ser: 1.17 mg/dL (ref 0.61–1.24)
GFR calc non Af Amer: >60 mL/min (ref >60)
GLUCOSE: 295 mg/dL — AB (ref 65–99)
POTASSIUM: 4.3 mmol/L (ref 3.5–5.1)
Sodium: 133 mmol/L — ABNORMAL LOW (ref 135–145)

## 2015-09-11 LAB — GLUCOSE, CAPILLARY
GLUCOSE-CAPILLARY: 299 mg/dL — AB (ref 65–99)
GLUCOSE-CAPILLARY: 267 mg/dL — AB (ref 65–99)
GLUCOSE-CAPILLARY: 270 mg/dL — AB (ref 65–99)
GLUCOSE-CAPILLARY: 246 mg/dL — AB (ref 65–99)

## 2015-09-11 LAB — BODY FLUID CELL COUNT WITH DIFFERENTIAL
Lymphs, Fluid: 78 %
Monocyte-Macrophage-Serous Fluid: 16 % — ABNORMAL LOW (ref 50–90)
Neutrophil Count, Fluid: 6 % (ref 0–25)
WBC FLUID: 594 uL (ref 0–1000)

## 2015-09-11 LAB — GRAM STAIN

## 2015-09-11 LAB — GLUCOSE, SEROUS FLUID: Glucose, Fluid: 300 mg/dL

## 2015-09-11 LAB — LACTATE DEHYDROGENASE, PLEURAL OR PERITONEAL FLUID: LD, Fluid: 45 U/L — ABNORMAL HIGH (ref 3–23)

## 2015-09-11 LAB — MAGNESIUM: Magnesium: 1.8 mg/dL (ref 1.7–2.4)

## 2015-09-11 LAB — PROTEIN, BODY FLUID

## 2015-09-11 LAB — HEMOGLOBIN A1C
Hgb A1c MFr Bld: 11.7 % — ABNORMAL HIGH (ref 4.8–5.6)
MEAN PLASMA GLUCOSE: 289 mg/dL

## 2015-09-11 MED ORDER — LORAZEPAM 2 MG/ML IJ SOLN
0.5000 mg | Freq: Four times a day (QID) | INTRAMUSCULAR | Status: AC | PRN
  Administered 2015-09-11 – 2015-09-15 (×6): 0.5 mg via INTRAVENOUS
  Filled 2015-09-11 (×6): qty 1

## 2015-09-11 MED ORDER — INSULIN ASPART 100 UNIT/ML ~~LOC~~ SOLN
3.0000 [IU] | Freq: Three times a day (TID) | SUBCUTANEOUS | Status: DC
  Administered 2015-09-11 – 2015-09-16 (×16): 3 [IU] via SUBCUTANEOUS

## 2015-09-11 MED ORDER — FUROSEMIDE 10 MG/ML IJ SOLN
20.0000 mg | Freq: Once | INTRAMUSCULAR | Status: AC
  Administered 2015-09-11: 20 mg via INTRAVENOUS
  Filled 2015-09-11: qty 2

## 2015-09-11 MED ORDER — INSULIN GLARGINE 100 UNIT/ML ~~LOC~~ SOLN
10.0000 [IU] | Freq: Every day | SUBCUTANEOUS | Status: DC
  Administered 2015-09-11 – 2015-09-13 (×3): 10 [IU] via SUBCUTANEOUS
  Filled 2015-09-11 (×4): qty 0.1

## 2015-09-11 NOTE — Progress Notes (Signed)
PROGRESS NOTE    [name]  BMW:[id number] DOB: [birth year] DOA: 09/07/2015 PCP: [facility]    Brief Narrative: [name] is a 60 y.o. male with medical history significant of chronic combined systolic and diastolic congestive heart failure, last transthoracic echocardiogram performed on 03/10/2015 that showed an EF of 30-35% with grade 3 diastolic dysfunction, history of hypertension, insulin-dependent diabetes mellitus, coronary artery disease, as well as having ongoing alcohol abuse as well as history of cocaine use. He presents to the emergency room, intoxicated, reports last drinking the  Morning of admission. He came in with complaints of shortness of breath becoming progressively worse over the past week.  Patient admitted with acute on chronic combine Heart failure exacerbation, he was treated for the same with IV lasix. He was started on CIWA due to high risk for alcohol withdrawal.      Assessment & Plan:   Principal Problem:   Acute on chronic combined systolic (congestive) and diastolic (congestive) heart failure (HCC) Active Problems:   Diabetes mellitus, type II (HCC)   Hypertension   Cardiomyopathy, nonischemic (HCC)   ETOH abuse   Alcohol dependence with withdrawal delirium (HCC)   CHF (congestive heart failure) (HCC)  Acute on chronic combined systolic and diastolic congestive heart failure Last ECHO ; 03/10/2015 that showed an EF of 30-35% with grade 3 diastolic dysfunction s/p IV Lasix 40 mg twice daily since admission, lasix d/ced on 5/17 due to elevation of cr Negative 2.2 L. No edema, no room air  lisinopril, imdur held , coreg dose reduced due to soft bp and elevation of cr.  Repeat echo with reduced EF at 25-35%,Cardiology following  Cr elevation: from overdiuresing? Lasix held on 5/17, resumed on 5/18, lasix titration per cardiology  Cough, ct chest + moderate pleural effusion bilaterally with extensive lower lobe airspace  consolidation, + atherosclerotic calcification. Currently no leukocytosis, no fever, on mucinex,  Provide mucinex, restart lasix, will monitor Will proceed with thoracentesis due to persistent sob  Chronic alcohol abuse; reported history of seizure from alcohol withdrawal, at risk for DT. CIWA protocol.   Type 2 diabetes mellitus, insulin-dependent, metformin held, on ssi here, a1c 11.7, patient likely noncompliant with meds and diet, Start lantus and ssi and optimize dose  Hypertension: lisinopril, imdur held and coreg dose resuded due to soft SBP   History of coronary artery disease. Currently denies chest pain, troponin negative at 0.08 in the emergency department. Place him on continuous cardiac monitoring. Last cath in 2014 moderate nonobstructive, nonischemic cardiomyopathy, patient has not been following up with cardiology  Depression;  unclear if he has been taking Zoloft, Wellbutrin, hold to avoid sedation.   Substance abuse; reported continued drink heavily and use drug, urine tox screen pending, reported substance abuse due to stress at home, advise patent to seem family counseling by pmd at [place].  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  Family Communication: care discussed with patient  Disposition Plan: case Production designer, theatre/television/film and social worker to assist in discharge planning Pre RN pam : Pt was staying with a friend before this admission and will not be able to go back. patient's sister Lupita Leash said that his wife is 80 and unable to take care of him, so disposition after discharge is uncertain.  Consultants:   cardiology  Procedures:   US guided thoracentesis  with 1.8 liter removed  Antimicrobials:   none   Subjective: Reports sob not getting better, now have visible tachypnea,though he is able to talk in full sentences and  does not use accessory muscle  remain productive cough, no fever, no chest pain No edema,  No overt sign of alcohol withdrawal  Objective: Filed Vitals:   09/10/15 0612 09/10/15 1440 09/10/15 2205 09/11/15 0444  BP: 102/79 109/77 100/70 105/68  Pulse: 80 72 81 86  Temp: 98.5 F (36.9 C) 97.6 F (36.4 C) 97.5 F (36.4 C) 98 F (36.7 C)  TempSrc: Oral Oral Oral Oral  Resp: Height:      Weight: 73.029 kg (161 lb)   73.2 kg (161 lb 6 oz)  SpO2: 98% 100% 99% 98%    Intake/Output Summary (Last 24 hours) at 09/11/15 0816 Last data filed at 09/11/15 0629  Gross per 24 hour  Intake    960 ml  Output   1575 ml  Net   -615 ml   Filed Weights   09/09/15 0500 09/10/15 0612 09/11/15 0444  Weight: 73.4 kg (161 lb 13.1 oz) 73.029 kg (161 lb) 73.2 kg (161 lb 6 oz)    Examination:  General exam: Appears calm and comfortable  Respiratory system: Respiratory effort normal. Bilateral crackles. No wheezing, no rhonchi Cardiovascular system: S1 & S2 heard, RRR. No pedal edema. Gastrointestinal system: Abdomen is nondistended, soft and nontender. No organomegaly or masses felt. Normal bowel sounds heard. Central nervous system: aaox3, no focal weakness, no asterixis Extremities: Symmetric 5 x 5 power. Skin: No rashes, lesions or ulcers     Data Reviewed: I have personally reviewed following labs and imaging studies  CBC:  Recent Labs Lab 09/07/15 0805 09/09/15 0317  WBC 6.3 6.4  NEUTROABS 3.4  --   HGB 15.2 13.2  HCT 43.2 38.7*  MCV 85.9 87.2  PLT 209 213   Basic Metabolic Panel:  Recent Labs Lab 09/07/15 0805 09/08/15 0304 09/09/15 0317 09/10/15 0516 09/11/15 0507  NA 135 140 138 133* 133*  K 4.0 3.7 4.0 3.6 4.3  CL 102 106 104 103 102  CO2 GLUCOSE 335* 130* 150* 231* 295*  BUN 9 18 29* 25* 24*  CREATININE 0.91 1.01 1.49* 1.09 1.17  CALCIUM 8.8* 8.7* 8.5* 7.9* 8.1*  MG  --  1.9  --  1.8 1.8   GFR: Estimated Creatinine Clearance:  68.6 mL/min (by C-G formula based on Cr of 1.17). Liver Function Tests:  Recent Labs Lab 09/07/15 0805 09/10/15 0516  AST 55* 50*  ALT 42 35  ALKPHOS 85 82  BILITOT 1.4* 1.0  PROT 7.0 6.0*  ALBUMIN 3.1* 2.5*   No results for input(s): LIPASE, AMYLASE in the last 168 hours. No results for input(s): AMMONIA in the last 168 hours. Coagulation Profile: No results for input(s): INR, PROTIME in the last 168 hours. Cardiac Enzymes: No results for input(s): CKTOTAL, CKMB, CKMBINDEX, TROPONINI in the last 168 hours. BNP (last 3 results) No results for input(s): PROBNP in the last 8760 hours. HbA1C:  Recent Labs  09/10/15 0516  HGBA1C 11.7*   CBG:  Recent Labs Lab 09/09/15 2228 09/10/15 0734 09/10/15 1155 09/10/15 1701 09/10/15 2204  GLUCAP 328* 198* 293* 126* 349*   Lipid Profile: No results for input(s): CHOL, HDL, LDLCALC, TRIG, CHOLHDL, LDLDIRECT in the last 72 hours. Thyroid Function Tests: No results for input(s): TSH, T4TOTAL, FREET4, T3FREE, THYROIDAB in the last  72 hours. Anemia Panel: No results for input(s): VITAMINB12, FOLATE, FERRITIN, TIBC, IRON, RETICCTPCT in the last 72 hours. Sepsis Labs: No results for input(s): PROCALCITON, LATICACIDVEN in the last 168 hours.  Recent Results (from the past 240 hour(s))  MRSA PCR Screening     Status: None   Collection Time: 09/07/15  5:28 PM  Result Value Ref Range Status   MRSA by PCR NEGATIVE NEGATIVE Final    Comment:        The GeneXpert MRSA Assay (FDA approved for NASAL specimens only), is one component of a comprehensive MRSA colonization surveillance program. It is not intended to diagnose MRSA infection nor to guide or monitor treatment for MRSA infections.          Radiology Studies: Ct Chest Wo Contrast  09/09/2015  CLINICAL DATA:  Progressive shortness of breath for 1 week EXAM: CT CHEST WITHOUT CONTRAST TECHNIQUE: Multidetector CT imaging of the chest was performed following the standard  protocol without IV contrast. COMPARISON:  Chest radiograph Sep 07, 2015 FINDINGS: Mediastinum/Lymph Nodes: Thyroid appears unremarkable. There are multiple small lymph nodes throughout the mediastinum. There are mildly enlarged lymph nodes anterior to the carina, largest lymph node measuring 1.7 x 1.4 cm. There are borderline prominent lymph nodes in the hila ; the absence of intravenous contrast makes delineation of lymph nodes from adjacent vessels difficult in this circumstance. There is prominence of the ascending aorta with a maximum transverse diameter of 4.4 x 4.0 cm. The visualized great vessels appear unremarkable on this noncontrast enhanced study except for mild calcification at the origins of the left and right common carotid arteries. There are foci of atherosclerotic calcification in the aorta. There are multiple foci of coronary artery calcification. The pericardium is not thickened. Lungs/Pleura: There are sizable free-flowing pleural effusions bilaterally. There is bibasilar airspace consolidation. There is patchy infiltrate in the posterior segment of the left upper lobe. There are areas of bronchiectatic change in the upper lobes bilaterally. No interstitial edema is evident. Upper abdomen: In the visualized upper abdomen, atherosclerotic calcification is noted in the aorta and left renal artery. Visualized upper abdominal structures otherwise appear unremarkable. Musculoskeletal: There is degenerative change in the thoracic spine. There are no blastic or lytic bone lesions. IMPRESSION: Moderate free-flowing pleural effusions bilaterally with extensive lower lobe airspace consolidation bilaterally. Patchy infiltrate is noted in the posterior segment of the left upper lobe. No focal interstitial edema appreciable. Atherosclerotic calcification is noted in the aorta with multiple foci of coronary artery calcification noted. Prominence of the ascending thoracic aorta with a maximum transverse  diameter 4.4 x 4.0 cm. Recommend annual imaging followup by CTA or MRA. This recommendation follows 2010 ACCF/AHA/AATS/ACR/ASA/SCA/SCAI/SIR/STS/SVM Guidelines for the Diagnosis and Management of Patients with Thoracic Aortic Disease. Circulation. 2010; 121: Z610-R604. Mildly prominent lymph nodes, likely reactive in etiology. Electronically Signed   By: Bretta Bang III M.D.   On: 09/09/2015 14:27   US Abdomen Limited Ruq  09/10/2015  CLINICAL DATA:  Elevated liver function tests.  Initial encounter. EXAM: US ABDOMEN LIMITED - RIGHT UPPER QUADRANT COMPARISON:  Abdominal ultrasound 02/21/2014. FINDINGS: Gallbladder: No gallstones or wall thickening visualized. No sonographic Murphy sign noted by sonographer. Common bile duct: Diameter: 0.2 cm Liver: No focal lesion identified. Within normal limits in parenchymal echogenicity. IMPRESSION: Negative exam. Electronically Signed   By: Drusilla Kanner M.D.   On: 09/10/2015 15:50        Scheduled Meds: . antiseptic oral rinse  7 mL Mouth Rinse BID  .  aspirin EC  81 mg Oral Daily  . carvedilol  3.125 mg Oral BID WC  . enoxaparin (LOVENOX) injection  40 mg Subcutaneous Q24H  . folic acid  1 mg Oral Daily  . furosemide  40 mg Intravenous Daily  . guaiFENesin  600 mg Oral BID  . insulin aspart  0-15 Units Subcutaneous TID WC  . insulin aspart  0-5 Units Subcutaneous QHS  . insulin aspart  3 Units Subcutaneous TID WC  . insulin glargine  10 Units Subcutaneous QHS  . LORazepam  0-4 mg Intravenous Q12H  . multivitamin with minerals  1 tablet Oral Daily  . pravastatin  10 mg Oral q1800  . sodium chloride flush  3 mL Intravenous Q12H  . thiamine  100 mg Oral Daily   Or  . thiamine  100 mg Intravenous Daily  . traZODone  100 mg Oral QHS   Continuous Infusions:    LOS: 4 days    Time spent: 35 minutes.     Albertine Grates, MD PhD [facility] Pager [phone]  If 7PM-7AM, please contact night-coverage [email or web address] Password  [email or web address] 09/11/2015, 8:16 AM

## 2015-09-11 NOTE — Procedures (Signed)
Successful US guided right thoracentesis. Yielded 1.8L of clear, amber colored fluid. Pt tolerated procedure well. No immediate complications.  Specimen was sent for labs. CXR ordered.  Brayton El PA-C 09/11/2015 1:45 PM

## 2015-09-11 NOTE — Progress Notes (Signed)
Spoke with Lupita Leash, patient's sister about his living arrangements.  Pt was staying with a friend before this admission and will not be able to go back.  Lupita Leash said that his wife is 84 and  unable to take care of him, so disposition after discharge is uncertain.

## 2015-09-11 NOTE — Progress Notes (Signed)
Patient Name: [name] Date of Encounter: 09/11/2015     Principal Problem:   Acute on chronic combined systolic (congestive) and diastolic (congestive) heart failure (HCC) Active Problems:   Diabetes mellitus, type II (HCC)   Hypertension   Cardiomyopathy, nonischemic (HCC)   ETOH abuse   Alcohol dependence with withdrawal delirium (HCC)   CHF (congestive heart failure) (HCC)    SUBJECTIVE  Resting comfortably. He slept okay last night once he was able to fall asleep. Breathing is better at night and more labored during the day.   CURRENT MEDS . antiseptic oral rinse  7 mL Mouth Rinse BID  . aspirin EC  81 mg Oral Daily  . carvedilol  3.125 mg Oral BID WC  . enoxaparin (LOVENOX) injection  40 mg Subcutaneous Q24H  . folic acid  1 mg Oral Daily  . furosemide  40 mg Intravenous Daily  . guaiFENesin  600 mg Oral BID  . insulin aspart  0-15 Units Subcutaneous TID WC  . insulin aspart  0-5 Units Subcutaneous QHS  . LORazepam  0-4 mg Intravenous Q12H  . multivitamin with minerals  1 tablet Oral Daily  . pravastatin  10 mg Oral q1800  . sodium chloride flush  3 mL Intravenous Q12H  . thiamine  100 mg Oral Daily   Or  . thiamine  100 mg Intravenous Daily  . traZODone  100 mg Oral QHS    OBJECTIVE  Filed Vitals:   09/10/15 0612 09/10/15 1440 09/10/15 2205 09/11/15 0444  BP: 102/79 109/77 100/70 105/68  Pulse: 80 72 81 86  Temp: 98.5 F (36.9 C) 97.6 F (36.4 C) 97.5 F (36.4 C) 98 F (36.7 C)  TempSrc: Oral Oral Oral Oral  Resp: 20 20 20 20   Height:      Weight: 161 lb (73.029 kg)   161 lb 6 oz (73.2 kg)  SpO2: 98% 100% 99% 98%    Intake/Output Summary (Last 24 hours) at 09/11/15 0654 Last data filed at 09/11/15 0629  Gross per 24 hour  Intake    960 ml  Output   1575 ml  Net   -615 ml   Filed Weights   09/09/15 0500 09/10/15 0612 09/11/15 0444  Weight: 161 lb 13.1 oz (73.4 kg) 161 lb (73.029 kg) 161 lb 6 oz (73.2 kg)    PHYSICAL  EXAM  General: Pleasant, NAD. Neuro: Alert and oriented X 3. Moves all extremities spontaneously. Psych: Normal affect. HEENT:  Normal  Neck: Supple without bruits or JVD. Lungs:  Resp regular and unlabored, CTA. Heart: RRR no s3, s4, or murmurs. Abdomen: Soft, non-tender, non-distended, BS + x 4.  Extremities: No clubbing, cyanosis or edema. DP/PT/Radials 2+ and equal bilaterally.  Accessory Clinical Findings  CBC  Recent Labs  09/09/15 0317  WBC 6.4  HGB 13.2  HCT 38.7*  MCV 87.2  PLT 213   Basic Metabolic Panel  Recent Labs  09/10/15 0516 09/11/15 0507  NA 133* 133*  K 3.6 4.3  CL 103 102  CO2 23 26  GLUCOSE 231* 295*  BUN 25* 24*  CREATININE 1.09 1.17  CALCIUM 7.9* 8.1*  MG 1.8 1.8   Liver Function Tests  Recent Labs  09/10/15 0516  AST 50*  ALT 35  ALKPHOS 82  BILITOT 1.0  PROT 6.0*  ALBUMIN 2.5*    TELE  nsr with pacs and pvcs  Radiology/Studies  Dg Chest 2 View  09/07/2015  CLINICAL DATA:  Increased shortness of breath,  weakness and lethargy. History of smoking. EXAM: CHEST  2 VIEW COMPARISON:  03/10/2015; 02/20/2014; 08/12/2012 FINDINGS: Grossly unchanged cardiac silhouette and mediastinal contours with atherosclerotic plaque within the thoracic aorta. The pulmonary vasculature is indistinct with cephalization of flow. Interval development of small to moderate sized layering bilateral effusions with associated bilateral mid and lower lung heterogeneous / consolidative opacities. No pneumothorax. No acute osseous abnormalities. IMPRESSION: Findings compatible with pulmonary edema with small to moderate size bilateral effusions and associated bilateral mid and lower lung opacities, atelectasis versus infiltrate. Electronically Signed   By: Simonne Come M.D.   On: 09/07/2015 08:45   Ct Chest Wo Contrast  09/09/2015  CLINICAL DATA:  Progressive shortness of breath for 1 week EXAM: CT CHEST WITHOUT CONTRAST TECHNIQUE: Multidetector CT imaging of the  chest was performed following the standard protocol without IV contrast. COMPARISON:  Chest radiograph Sep 07, 2015 FINDINGS: Mediastinum/Lymph Nodes: Thyroid appears unremarkable. There are multiple small lymph nodes throughout the mediastinum. There are mildly enlarged lymph nodes anterior to the carina, largest lymph node measuring 1.7 x 1.4 cm. There are borderline prominent lymph nodes in the hila ; the absence of intravenous contrast makes delineation of lymph nodes from adjacent vessels difficult in this circumstance. There is prominence of the ascending aorta with a maximum transverse diameter of 4.4 x 4.0 cm. The visualized great vessels appear unremarkable on this noncontrast enhanced study except for mild calcification at the origins of the left and right common carotid arteries. There are foci of atherosclerotic calcification in the aorta. There are multiple foci of coronary artery calcification. The pericardium is not thickened. Lungs/Pleura: There are sizable free-flowing pleural effusions bilaterally. There is bibasilar airspace consolidation. There is patchy infiltrate in the posterior segment of the left upper lobe. There are areas of bronchiectatic change in the upper lobes bilaterally. No interstitial edema is evident. Upper abdomen: In the visualized upper abdomen, atherosclerotic calcification is noted in the aorta and left renal artery. Visualized upper abdominal structures otherwise appear unremarkable. Musculoskeletal: There is degenerative change in the thoracic spine. There are no blastic or lytic bone lesions. IMPRESSION: Moderate free-flowing pleural effusions bilaterally with extensive lower lobe airspace consolidation bilaterally. Patchy infiltrate is noted in the posterior segment of the left upper lobe. No focal interstitial edema appreciable. Atherosclerotic calcification is noted in the aorta with multiple foci of coronary artery calcification noted. Prominence of the ascending  thoracic aorta with a maximum transverse diameter 4.4 x 4.0 cm. Recommend annual imaging followup by CTA or MRA. This recommendation follows 2010 ACCF/AHA/AATS/ACR/ASA/SCA/SCAI/SIR/STS/SVM Guidelines for the Diagnosis and Management of Patients with Thoracic Aortic Disease. Circulation. 2010; 121: W119-J478. Mildly prominent lymph nodes, likely reactive in etiology. Electronically Signed   By: Bretta Bang III M.D.   On: 09/09/2015 14:27   US Abdomen Limited Ruq  09/10/2015  CLINICAL DATA:  Elevated liver function tests.  Initial encounter. EXAM: US ABDOMEN LIMITED - RIGHT UPPER QUADRANT COMPARISON:  Abdominal ultrasound 02/21/2014. FINDINGS: Gallbladder: No gallstones or wall thickening visualized. No sonographic Murphy sign noted by sonographer. Common bile duct: Diameter: 0.2 cm Liver: No focal lesion identified. Within normal limits in parenchymal echogenicity. IMPRESSION: Negative exam. Electronically Signed   By: Drusilla Kanner M.D.   On: 09/10/2015 15:50   2D ECHO: 09/10/2015 LV EF: 25% - 30% Study Conclusions - Left ventricle: The cavity size was moderately dilated. Systolic  function was severely reduced. The estimated ejection fraction  was in the range of 25% to 30%. - Aortic valve:  There was trivial regurgitation. - Mitral valve: There was moderate regurgitation. - Left atrium: The atrium was mildly dilated. - Right ventricle: Systolic function was mildly to moderately  reduced. - Pulmonary arteries: Systolic pressure was moderately increased.  PA peak pressure: 40 mm Hg (S). - Pericardium, extracardiac: A trivial pericardial effusion was  identified. There was a left pleural effusion.   ASSESSMENT AND PLAN  [name] is a 60 y.o. male with a history of NICM/chronic combined S/D CHF (EF 30-35%/G3DD), HTN, IDDM, CAD and ongoing alcohol and cocaine abuse who presented to [facility] on 09/07/15 with SOB while intoxicated.   Acute on chronic combined S/D CHF: reapeat  2D ECHO with mildly worsening EF to 25-30%, mod MR, mild LAE, mild-mod RV dysfunction, PA pressure 40, trivial pericardial effusion with left pleural effusion.  -- BNP of 1076, chest x-ray showing evidence of pulmonary edema. May need to consider throracentesis- start on right side as he as a moderate effusion with atelectasis of lung tissue. I have ordered a left lateral decubitus cxr this AM per Dr. Fabio Bering rec yesterday. -- Started on IV lasix. Currently on IV Lasix 40mg  daily (plan was to increase to 40mg  BID yesterday but order never placed). I will give an extra dose of IV lasix 20mg  x1 now. Net neg 3.2L.  -- Continue Coreg 3.125mg  BID and lisinopril held due to AKI and soft BPs. Not overtly volume overloaded on exam. Creat is stable with diuresis.   Elevated troponin: 0.08--> 0.06. Low and flat c/w demand ischemia.   HTN: BPs soft. Lisinopril and imdur held.   Type 2 diabetes mellitus: metformin held, HgA1c 11.7  History of coronary artery disease: non obst on cath in 2014.   Polysubstance abuse: UDS + cocaine, benzos and was intoxicated on alcohol on admission. On CIWA protocol. Long term prognosis very poor if he cannot quit drinking and using drugs.   Non compliance: does not follow up as an outpatient and continues to abuse drugs and alcohol    Signed, Cline Crock PA-C  Pager [phone]  Modest diuresis over night Echo pending exam with no LE edema but bilateral effusions For possible thoracentesis today lateral decubitus film pending. Continue iv lasix  Charlton Haws

## 2015-09-12 DIAGNOSIS — B1920 Unspecified viral hepatitis C without hepatic coma: Secondary | ICD-10-CM

## 2015-09-12 DIAGNOSIS — B199 Unspecified viral hepatitis without hepatic coma: Secondary | ICD-10-CM | POA: Insufficient documentation

## 2015-09-12 LAB — BASIC METABOLIC PANEL
Anion gap: 8 (ref 5–15)
BUN: 19 mg/dL (ref 6–20)
CALCIUM: 8 mg/dL — AB (ref 8.9–10.3)
CHLORIDE: 100 mmol/L — AB (ref 101–111)
CO2: 24 mmol/L (ref 22–32)
CREATININE: 1.15 mg/dL (ref 0.61–1.24)
GFR calc non Af Amer: >60 mL/min (ref >60)
GLUCOSE: 271 mg/dL — AB (ref 65–99)
Potassium: 4.1 mmol/L (ref 3.5–5.1)
Sodium: 132 mmol/L — ABNORMAL LOW (ref 135–145)

## 2015-09-12 LAB — HCV RNA QUANT

## 2015-09-12 LAB — GLUCOSE, CAPILLARY
GLUCOSE-CAPILLARY: 219 mg/dL — AB (ref 65–99)
GLUCOSE-CAPILLARY: 262 mg/dL — AB (ref 65–99)
Glucose-Capillary: 264 mg/dL — ABNORMAL HIGH (ref 65–99)
Glucose-Capillary: 193 mg/dL — ABNORMAL HIGH (ref 65–99)

## 2015-09-12 LAB — MAGNESIUM: Magnesium: 1.8 mg/dL (ref 1.7–2.4)

## 2015-09-12 LAB — HCV RNA (INTERNATIONAL UNITS)
HCV RNA (INTERNATIONAL UNITS): 31300000 [IU]/mL
HCV log10: 7.496 log10 IU/mL

## 2015-09-12 LAB — BRAIN NATRIURETIC PEPTIDE: B Natriuretic Peptide: 753.4 pg/mL — ABNORMAL HIGH (ref 0.0–100.0)

## 2015-09-12 LAB — TSH: TSH: 2.455 u[IU]/mL (ref 0.350–4.500)

## 2015-09-12 MED ORDER — FUROSEMIDE 20 MG PO TABS
20.0000 mg | ORAL_TABLET | Freq: Every day | ORAL | Status: DC
  Administered 2015-09-12 – 2015-09-13 (×2): 20 mg via ORAL
  Filled 2015-09-12 (×2): qty 1

## 2015-09-12 MED ORDER — MAGNESIUM SULFATE 2 GM/50ML IV SOLN
2.0000 g | Freq: Once | INTRAVENOUS | Status: AC
  Administered 2015-09-12: 2 g via INTRAVENOUS
  Filled 2015-09-12: qty 50

## 2015-09-12 NOTE — Progress Notes (Signed)
PT Cancellation Note  Patient Details Name: [name] MRN: [id number] DOB: [birth year]   Cancelled Treatment:    Reason Eval/Treat Not Completed: Patient declined, no reason specified ("tomorrow")   [facility] 09/12/2015, 12:55 PM

## 2015-09-12 NOTE — Progress Notes (Addendum)
PROGRESS NOTE    CASHAWN YANKO  XLK:[id number] DOB: [birth year] DOA: 09/07/2015 PCP: [facility]    Brief Narrative: TAIGE HOUSMAN is a 60 y.o. male with medical history significant of chronic combined systolic and diastolic congestive heart failure, last transthoracic echocardiogram performed on 03/10/2015 that showed an EF of 30-35% with grade 3 diastolic dysfunction, history of hypertension, insulin-dependent diabetes mellitus, coronary artery disease, as well as having ongoing alcohol abuse as well as history of cocaine use. He presents to the emergency room, intoxicated, reports last drinking the  Morning of admission. He came in with complaints of shortness of breath becoming progressively worse over the past week.  Patient admitted with acute on chronic combine Heart failure exacerbation, he was treated for the same with IV lasix. He was started on CIWA due to high risk for alcohol withdrawal.      Assessment & Plan:   Principal Problem:   Acute on chronic combined systolic (congestive) and diastolic (congestive) heart failure (HCC) Active Problems:   Diabetes mellitus, type II (HCC)   Hypertension   Cardiomyopathy, nonischemic (HCC)   ETOH abuse   Alcohol dependence with withdrawal delirium (HCC)   CHF (congestive heart failure) (HCC)   Pleural effusion on right  Acute on chronic combined systolic and diastolic congestive heart failure Last ECHO ; 03/10/2015 that showed an EF of 30-35% with grade 3 diastolic dysfunction  lisinopril, imdur held , coreg dose reduced due to soft bp and elevation of cr.  Repeat echo with reduced EF at 25-35%,Cardiology following s/p IV Lasix 40 mg twice daily since admission, lasix d/ced on 5/17 due to elevation of cr, low dose oral lasix restarted on 5/18, cr stable so far on low dose lasix  Cr elevation: from overdiuresing? Lasix held on 5/17, resumed on 5/18, lasix titration per cardiology, cr stable  Pleural effusion: ct chest  + moderate pleural effusion bilaterally with extensive lower lobe airspace consolidation, + atherosclerotic calcification.  s/p thoracentesis on 5/19 due to persistent sob, with 1.8 liter of  pleural fluids drained. Pleural fluids lymphocyte predominent, total protein less than 3, ldh 45, gram stain no organism, likely transudate Currently no leukocytosis, no fever, continue  lasix  NSVT: 6 beats on tele on 5/20, iv mag given, continue coreg. Keep k >4, mag>2.  Chronic alcohol abuse; reported history of seizure from alcohol withdrawal, at risk for DT. CIWA protocol.   Type 2 diabetes mellitus, insulin-dependent, a1c 11.7, metformin held, on ssi here,  patient likely noncompliant with meds and diet, Start lantus and ssi and optimize dose  Hypertension: lisinopril, imdur held and coreg dose resuded due to soft SBP   History of coronary artery disease. Currently denies chest pain, troponin negative at 0.08 in the emergency department. Place him on continuous cardiac monitoring. Last cath in 2014 moderate nonobstructive, nonischemic cardiomyopathy, patient has not been following up with cardiology  Depression;  unclear if he has been taking Zoloft, Wellbutrin, hold to avoid sedation.   Substance abuse;  reported continued drink heavily and use drug, urine tox screen +cocaine ,  Patient reported substance abuse due to stress at home, advise patent to seem family counseling by pmd at [place].  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  hcv rna significantly elevated at [id number]. [facility]. He is advised to stop alcohol and illicit  drug use  DVT prophylaxis: Lovenox Code Status: Full code.  Family Communication: care discussed with patient  Disposition Plan:  Pre RN pam : Pt was staying with a friend before this admission and will not be able to go  back. patient's sister Lupita Leash said that his wife is 19 and unable to take care of him, so disposition after discharge is uncertain. [facility] states that [facility] has some kinds of housing plan, case manager and social worker to assist in discharge planning  Consultants:   cardiology  Procedures:   US guided thoracentesis with 1.8 liter removed  Antimicrobials:   none   Subjective: Feeling better, denies chest pain,No edema, no cough, no fever, on 3liter oxygen, denies sob at rest  No overt sign of alcohol withdrawal  Objective: Filed Vitals:   09/11/15 1401 09/11/15 2118 09/12/15 0621 09/12/15 1433  BP: 111/67 115/73 98/65 115/76  Pulse: 70 83 76 75  Temp: 97.5 F (36.4 C) 98.1 F (36.7 C) 97.4 F (36.3 C) 97.4 F (36.3 C)  TempSrc: Oral Oral Oral Oral  Resp: 20 20 20 20   Height:      Weight:   71.85 kg (158 lb 6.4 oz)   SpO2: 100% 99% 100% 100%    Intake/Output Summary (Last 24 hours) at 09/12/15 1747 Last data filed at 09/12/15 1000  Gross per 24 hour  Intake    240 ml  Output   1150 ml  Net   -910 ml   Filed Weights   09/10/15 0612 09/11/15 0444 09/12/15 0621  Weight: 73.029 kg (161 lb) 73.2 kg (161 lb 6 oz) 71.85 kg (158 lb 6.4 oz)    Examination:  General exam: Appears calm and comfortable  Respiratory system: Respiratory effort normal. Bilateral crackles has largely resolved, No wheezing, no rhonchi,  Cardiovascular system: S1 & S2 heard, RRR. No pedal edema. Gastrointestinal system: Abdomen is nondistended, soft and nontender. No organomegaly or masses felt. Normal bowel sounds heard. Central nervous system: aaox3, no focal weakness, no asterixis Extremities: Symmetric 5 x 5 power. Skin: No rashes, lesions or ulcers     Data Reviewed: I have personally reviewed following labs and imaging studies  CBC:  Recent Labs Lab 09/07/15 0805 09/09/15 0317  WBC 6.3 6.4  NEUTROABS 3.4  --   HGB 15.2 13.2  HCT 43.2 38.7*  MCV 85.9 87.2  PLT 209 213    Basic Metabolic Panel:  Recent Labs Lab 09/08/15 0304 09/09/15 0317 09/10/15 0516 09/11/15 0507 09/12/15 0516  NA 140 138 133* 133* 132*  K 3.7 4.0 3.6 4.3 4.1  CL 106 104 103 102 100*  CO2 23 27 23 26 24   GLUCOSE 130* 150* 231* 295* 271*  BUN 18 29* 25* 24* 19  CREATININE 1.01 1.49* 1.09 1.17 1.15  CALCIUM 8.7* 8.5* 7.9* 8.1* 8.0*  MG 1.9  --  1.8 1.8 1.8   GFR: Estimated Creatinine Clearance: 68.6 mL/min (by C-G formula based on Cr of 1.15). Liver Function Tests:  Recent Labs Lab 09/07/15 0805 09/10/15 0516  AST 55* 50*  ALT 42 35  ALKPHOS 85 82  BILITOT 1.4* 1.0  PROT 7.0 6.0*  ALBUMIN 3.1* 2.5*   No results for input(s): LIPASE, AMYLASE in the last 168 hours. No results for input(s): AMMONIA in the last 168 hours. Coagulation Profile: No results for input(s): INR, PROTIME in the last 168 hours. Cardiac Enzymes: No results for input(s): CKTOTAL, CKMB, CKMBINDEX, TROPONINI in the last 168 hours. BNP (last 3 results)  No results for input(s): PROBNP in the last 8760 hours. HbA1C:  Recent Labs  09/10/15 0516  HGBA1C 11.7*   CBG:  Recent Labs Lab 09/11/15 1649 09/11/15 2123 09/12/15 0801 09/12/15 1152 09/12/15 1712  GLUCAP 270* 246* 264* 219* 193*   Lipid Profile: No results for input(s): CHOL, HDL, LDLCALC, TRIG, CHOLHDL, LDLDIRECT in the last 72 hours. Thyroid Function Tests:  Recent Labs  09/12/15 0516  TSH 2.455   Anemia Panel: No results for input(s): VITAMINB12, FOLATE, FERRITIN, TIBC, IRON, RETICCTPCT in the last 72 hours. Sepsis Labs: No results for input(s): PROCALCITON, LATICACIDVEN in the last 168 hours.  Recent Results (from the past 240 hour(s))  MRSA PCR Screening     Status: None   Collection Time: 09/07/15  5:28 PM  Result Value Ref Range Status   MRSA by PCR NEGATIVE NEGATIVE Final    Comment:        The GeneXpert MRSA Assay (FDA approved for NASAL specimens only), is one component of a comprehensive MRSA  colonization surveillance program. It is not intended to diagnose MRSA infection nor to guide or monitor treatment for MRSA infections.   Culture, body fluid-bottle     Status: None (Preliminary result)   Collection Time: 09/11/15  1:44 PM  Result Value Ref Range Status   Specimen Description FLUID PLEURAL  Final   Special Requests BOTTLES DRAWN AEROBIC AND ANAEROBIC 10CC  Final   Culture   Final    NO GROWTH < 24 HOURS Performed at [facility]    Report Status PENDING  Incomplete  Gram stain     Status: None   Collection Time: 09/11/15  1:44 PM  Result Value Ref Range Status   Specimen Description FLUID PLEURAL  Final   Special Requests NONE  Final   Gram Stain   Final    ABUNDANT WBC PRESENT, PREDOMINANTLY MONONUCLEAR NO ORGANISMS SEEN Performed at [facility]    Report Status 09/11/2015 FINAL  Final         Radiology Studies: Dg Chest 1 View  09/11/2015  CLINICAL DATA:  Status post right thoracentesis EXAM: CHEST 1 VIEW COMPARISON:  09/07/2015 chest radiographs and 09/11/2015 left lateral decubitus chest radiograph. FINDINGS: Stable cardiomediastinal silhouette with top-normal heart size. No pneumothorax. Small residual right pleural effusion, decreased. Small left pleural effusion, stable. No overt pulmonary edema. Left greater than right bibasilar patchy lung opacities, stable on the left and decreased on the right. IMPRESSION: 1. No pneumothorax. 2. Small residual right pleural effusion, decreased. Mild right lung base opacity, decreased, favor atelectasis. 3. Stable small left pleural effusion and left basilar lung opacity, favor atelectasis. Electronically Signed   By: Delbert Phenix M.D.   On: 09/11/2015 14:10   Dg Chest Left Decubitus  09/11/2015  CLINICAL DATA:  Short of breath. History of hypertension and diabetes. Pleural effusions on recent CT. EXAM: CHEST - LEFT DECUBITUS COMPARISON:  Chest CT, 09/09/2015.  Chest radiograph, 09/07/2015. FINDINGS:  Pleural fluid layers along the dependent aspects of the right and left hemi thoraces on the decubitus view. There is additional lung base opacity bilaterally that is likely atelectasis. Pneumonia is not excluded. There is no evidence of pulmonary edema. IMPRESSION: Bilateral free-flowing pleural effusions. Electronically Signed   By: Amie Portland M.D.   On: 09/11/2015 09:55   US Thoracentesis Asp Pleural Space W/img Guide  09/11/2015  INDICATION: Shortness of breath, congestive heart failure. Large right pleural effusion. Request diagnostic and therapeutic thoracentesis. EXAM: ULTRASOUND GUIDED RIGHT  THORACENTESIS MEDICATIONS: None. COMPLICATIONS: None immediate. PROCEDURE: An ultrasound guided thoracentesis was thoroughly discussed with the patient and questions answered. The benefits, risks, alternatives and complications were also discussed. The patient understands and wishes to proceed with the procedure. Written consent was obtained. Ultrasound was performed to localize and mark an adequate pocket of fluid in the right chest. The area was then prepped and draped in the normal sterile fashion. 1% Lidocaine was used for local anesthesia. Under ultrasound guidance a Safe-T-Centesis catheter was introduced. Thoracentesis was performed. The catheter was removed and a dressing applied. FINDINGS: A total of approximately 1.8 L of clear, amber colored fluid was removed. Samples were sent to the laboratory as requested by the clinical team. IMPRESSION: Successful ultrasound guided right thoracentesis yielding 1.8 L of pleural fluid. Read by: Brayton El PA-C Electronically Signed   By: Gilmer Mor D.O.   On: 09/11/2015 13:54        Scheduled Meds: . antiseptic oral rinse  7 mL Mouth Rinse BID  . aspirin EC  81 mg Oral Daily  . carvedilol  3.125 mg Oral BID WC  . enoxaparin (LOVENOX) injection  40 mg Subcutaneous Q24H  . folic acid  1 mg Oral Daily  . furosemide  20 mg Oral Daily  . guaiFENesin  600  mg Oral BID  . insulin aspart  0-15 Units Subcutaneous TID WC  . insulin aspart  0-5 Units Subcutaneous QHS  . insulin aspart  3 Units Subcutaneous TID WC  . insulin glargine  10 Units Subcutaneous QHS  . magnesium sulfate 1 - 4 g bolus IVPB  2 g Intravenous Once  . multivitamin with minerals  1 tablet Oral Daily  . pravastatin  10 mg Oral q1800  . sodium chloride flush  3 mL Intravenous Q12H  . thiamine  100 mg Oral Daily  . traZODone  100 mg Oral QHS   Continuous Infusions:    LOS: 5 days    Time spent: 25 minutes.     Albertine Grates, MD PhD [facility] Pager [phone]  If 7PM-7AM, please contact night-coverage [email or web address] Password [email or web address] 09/12/2015, 5:47 PM

## 2015-09-12 NOTE — Progress Notes (Signed)
Patient ID: [name], male   DOB: 04-29-1955, 60 y.o.   MRN: [id number]   Patient Name: [name] Date of Encounter: 09/12/2015     Principal Problem:   Acute on chronic combined systolic (congestive) and diastolic (congestive) heart failure (HCC) Active Problems:   Diabetes mellitus, type II (HCC)   Hypertension   Cardiomyopathy, nonischemic (HCC)   ETOH abuse   Alcohol dependence with withdrawal delirium (HCC)   CHF (congestive heart failure) (HCC)   Pleural effusion on right    SUBJECTIVE  Eating breakfast feeling much better Wants to try to get into [place] facility at [facility] post d/c  CURRENT MEDS . antiseptic oral rinse  7 mL Mouth Rinse BID  . aspirin EC  81 mg Oral Daily  . carvedilol  3.125 mg Oral BID WC  . enoxaparin (LOVENOX) injection  40 mg Subcutaneous Q24H  . folic acid  1 mg Oral Daily  . furosemide  40 mg Intravenous Daily  . guaiFENesin  600 mg Oral BID  . insulin aspart  0-15 Units Subcutaneous TID WC  . insulin aspart  0-5 Units Subcutaneous QHS  . insulin aspart  3 Units Subcutaneous TID WC  . insulin glargine  10 Units Subcutaneous QHS  . multivitamin with minerals  1 tablet Oral Daily  . pravastatin  10 mg Oral q1800  . sodium chloride flush  3 mL Intravenous Q12H  . thiamine  100 mg Oral Daily  . traZODone  100 mg Oral QHS    OBJECTIVE  Filed Vitals:   09/11/15 1346 09/11/15 1401 09/11/15 2118 09/12/15 0621  BP: 102/61 111/67 115/73 98/65  Pulse:  70 83 76  Temp:  97.5 F (36.4 C) 98.1 F (36.7 C) 97.4 F (36.3 C)  TempSrc:  Oral Oral Oral  Resp:  20 20 20   Height:      Weight:    71.85 kg (158 lb 6.4 oz)  SpO2: 100% 100% 99% 100%    Intake/Output Summary (Last 24 hours) at 09/12/15 0816 Last data filed at 09/12/15 0626  Gross per 24 hour  Intake    600 ml  Output   1876 ml  Net  -1276 ml   Filed Weights   09/10/15 0612 09/11/15 0444 09/12/15 0621  Weight: 73.029 kg (161 lb) 73.2 kg (161 lb 6 oz) 71.85 kg (158 lb  6.4 oz)    PHYSICAL EXAM  General: Pleasant, NAD. Neuro: Alert and oriented X 3. Moves all extremities spontaneously. Psych: Normal affect. HEENT:  Normal  Neck: Supple without bruits or JVD. Lungs:  Resp regular and improved aeration on rgith  Heart: RRR no s3, s4, or murmurs. Abdomen: Soft, non-tender, non-distended, BS + x 4.  Extremities: No clubbing, cyanosis or edema. DP/PT/Radials 2+ and equal bilaterally.  Accessory Clinical Findings  CBC No results for input(s): WBC, NEUTROABS, HGB, HCT, MCV, PLT in the last 72 hours. Basic Metabolic Panel  Recent Labs  09/11/15 0507 09/12/15 0516  NA 133* 132*  K 4.3 4.1  CL 102 100*  CO2 26 24  GLUCOSE 295* 271*  BUN 24* 19  CREATININE 1.17 1.15  CALCIUM 8.1* 8.0*  MG 1.8 1.8   Liver Function Tests  Recent Labs  09/10/15 0516  AST 50*  ALT 35  ALKPHOS 82  BILITOT 1.0  PROT 6.0*  ALBUMIN 2.5*    TELE  NSR rates 80's no arrhythmia 09/12/2015   Radiology/Studies  Dg Chest 1 View  09/11/2015  CLINICAL DATA:  Status post right thoracentesis EXAM: CHEST 1 VIEW COMPARISON:  09/07/2015 chest radiographs and 09/11/2015 left lateral decubitus chest radiograph. FINDINGS: Stable cardiomediastinal silhouette with top-normal heart size. No pneumothorax. Small residual right pleural effusion, decreased. Small left pleural effusion, stable. No overt pulmonary edema. Left greater than right bibasilar patchy lung opacities, stable on the left and decreased on the right. IMPRESSION: 1. No pneumothorax. 2. Small residual right pleural effusion, decreased. Mild right lung base opacity, decreased, favor atelectasis. 3. Stable small left pleural effusion and left basilar lung opacity, favor atelectasis. Electronically Signed   By: Delbert Phenix M.D.   On: 09/11/2015 14:10   Dg Chest 2 View  09/07/2015  CLINICAL DATA:  Increased shortness of breath, weakness and lethargy. History of smoking. EXAM: CHEST  2 VIEW COMPARISON:  03/10/2015;  02/20/2014; 08/12/2012 FINDINGS: Grossly unchanged cardiac silhouette and mediastinal contours with atherosclerotic plaque within the thoracic aorta. The pulmonary vasculature is indistinct with cephalization of flow. Interval development of small to moderate sized layering bilateral effusions with associated bilateral mid and lower lung heterogeneous / consolidative opacities. No pneumothorax. No acute osseous abnormalities. IMPRESSION: Findings compatible with pulmonary edema with small to moderate size bilateral effusions and associated bilateral mid and lower lung opacities, atelectasis versus infiltrate. Electronically Signed   By: Simonne Come M.D.   On: 09/07/2015 08:45   Ct Chest Wo Contrast  09/09/2015  CLINICAL DATA:  Progressive shortness of breath for 1 week EXAM: CT CHEST WITHOUT CONTRAST TECHNIQUE: Multidetector CT imaging of the chest was performed following the standard protocol without IV contrast. COMPARISON:  Chest radiograph Sep 07, 2015 FINDINGS: Mediastinum/Lymph Nodes: Thyroid appears unremarkable. There are multiple small lymph nodes throughout the mediastinum. There are mildly enlarged lymph nodes anterior to the carina, largest lymph node measuring 1.7 x 1.4 cm. There are borderline prominent lymph nodes in the hila ; the absence of intravenous contrast makes delineation of lymph nodes from adjacent vessels difficult in this circumstance. There is prominence of the ascending aorta with a maximum transverse diameter of 4.4 x 4.0 cm. The visualized great vessels appear unremarkable on this noncontrast enhanced study except for mild calcification at the origins of the left and right common carotid arteries. There are foci of atherosclerotic calcification in the aorta. There are multiple foci of coronary artery calcification. The pericardium is not thickened. Lungs/Pleura: There are sizable free-flowing pleural effusions bilaterally. There is bibasilar airspace consolidation. There is patchy  infiltrate in the posterior segment of the left upper lobe. There are areas of bronchiectatic change in the upper lobes bilaterally. No interstitial edema is evident. Upper abdomen: In the visualized upper abdomen, atherosclerotic calcification is noted in the aorta and left renal artery. Visualized upper abdominal structures otherwise appear unremarkable. Musculoskeletal: There is degenerative change in the thoracic spine. There are no blastic or lytic bone lesions. IMPRESSION: Moderate free-flowing pleural effusions bilaterally with extensive lower lobe airspace consolidation bilaterally. Patchy infiltrate is noted in the posterior segment of the left upper lobe. No focal interstitial edema appreciable. Atherosclerotic calcification is noted in the aorta with multiple foci of coronary artery calcification noted. Prominence of the ascending thoracic aorta with a maximum transverse diameter 4.4 x 4.0 cm. Recommend annual imaging followup by CTA or MRA. This recommendation follows 2010 ACCF/AHA/AATS/ACR/ASA/SCA/SCAI/SIR/STS/SVM Guidelines for the Diagnosis and Management of Patients with Thoracic Aortic Disease. Circulation. 2010; 121: W119-J478. Mildly prominent lymph nodes, likely reactive in etiology. Electronically Signed   By: Bretta Bang III M.D.   On: 09/09/2015 14:27  Dg Chest Left Decubitus  09/11/2015  CLINICAL DATA:  Short of breath. History of hypertension and diabetes. Pleural effusions on recent CT. EXAM: CHEST - LEFT DECUBITUS COMPARISON:  Chest CT, 09/09/2015.  Chest radiograph, 09/07/2015. FINDINGS: Pleural fluid layers along the dependent aspects of the right and left hemi thoraces on the decubitus view. There is additional lung base opacity bilaterally that is likely atelectasis. Pneumonia is not excluded. There is no evidence of pulmonary edema. IMPRESSION: Bilateral free-flowing pleural effusions. Electronically Signed   By: Amie Portland M.D.   On: 09/11/2015 09:55   US Abdomen  Limited Ruq  09/10/2015  CLINICAL DATA:  Elevated liver function tests.  Initial encounter. EXAM: US ABDOMEN LIMITED - RIGHT UPPER QUADRANT COMPARISON:  Abdominal ultrasound 02/21/2014. FINDINGS: Gallbladder: No gallstones or wall thickening visualized. No sonographic Murphy sign noted by sonographer. Common bile duct: Diameter: 0.2 cm Liver: No focal lesion identified. Within normal limits in parenchymal echogenicity. IMPRESSION: Negative exam. Electronically Signed   By: Drusilla Kanner M.D.   On: 09/10/2015 15:50   US Thoracentesis Asp Pleural Space W/img Guide  09/11/2015  INDICATION: Shortness of breath, congestive heart failure. Large right pleural effusion. Request diagnostic and therapeutic thoracentesis. EXAM: ULTRASOUND GUIDED RIGHT THORACENTESIS MEDICATIONS: None. COMPLICATIONS: None immediate. PROCEDURE: An ultrasound guided thoracentesis was thoroughly discussed with the patient and questions answered. The benefits, risks, alternatives and complications were also discussed. The patient understands and wishes to proceed with the procedure. Written consent was obtained. Ultrasound was performed to localize and mark an adequate pocket of fluid in the right chest. The area was then prepped and draped in the normal sterile fashion. 1% Lidocaine was used for local anesthesia. Under ultrasound guidance a Safe-T-Centesis catheter was introduced. Thoracentesis was performed. The catheter was removed and a dressing applied. FINDINGS: A total of approximately 1.8 L of clear, amber colored fluid was removed. Samples were sent to the laboratory as requested by the clinical team. IMPRESSION: Successful ultrasound guided right thoracentesis yielding 1.8 L of pleural fluid. Read by: Brayton El PA-C Electronically Signed   By: Gilmer Mor D.O.   On: 09/11/2015 13:54   2D ECHO: 09/10/2015 LV EF: 25% - 30% Study Conclusions - Left ventricle: The cavity size was moderately dilated. Systolic  function was  severely reduced. The estimated ejection fraction  was in the range of 25% to 30%. - Aortic valve: There was trivial regurgitation. - Mitral valve: There was moderate regurgitation. - Left atrium: The atrium was mildly dilated. - Right ventricle: Systolic function was mildly to moderately  reduced. - Pulmonary arteries: Systolic pressure was moderately increased.  PA peak pressure: 40 mm Hg (S). - Pericardium, extracardiac: A trivial pericardial effusion was  identified. There was a left pleural effusion.   ASSESSMENT AND PLAN  [name] is a 60 y.o. male with a history of NICM/chronic combined S/D CHF (EF 30-35%/G3DD), HTN, IDDM, CAD and ongoing alcohol and cocaine abuse who presented to [facility] on 09/07/15 with SOB while intoxicated.   Acute on chronic combined S/D CHF: reapeat 2D ECHO with mildly worsening EF to 25-30%, mod MR, mild LAE, mild-mod RV dysfunction, PA pressure 40, trivial pericardial effusion with left pleural effusion.  -- BNP of 1076, chest x-ray showing evidence of pulmonary edema. S/P thoracentesis with 1.8 Liters removed.  Needs flutter valve or IS -- continue lasix can change to PO follow Cr   -- Continue Coreg 3.125mg  BID and lisinopril held due to AKI and soft BPs. Not overtly volume  overloaded on exam. Creat is stable with diuresis.   Elevated troponin: 0.08--> 0.06. Low and flat c/w demand ischemia. No plans for cath   HTN: BPs soft. Lisinopril and imdur held.   Type 2 diabetes mellitus: metformin held, HgA1c 11.7  History of coronary artery disease: non obst on cath in 2014.   Polysubstance abuse: UDS + cocaine, benzos and was intoxicated on alcohol on admission. On CIWA protocol. Long term prognosis very poor if he cannot quit drinking and using drugs.   Non compliance: does not follow up as an outpatient and continues to abuse drugs and alcohol   Disposition unclear that he has a place to go home to    [employer]

## 2015-09-13 ENCOUNTER — Inpatient Hospital Stay (HOSPITAL_COMMUNITY): Payer: Medicare Other

## 2015-09-13 LAB — BASIC METABOLIC PANEL
Anion gap: 6 (ref 5–15)
BUN: 17 mg/dL (ref 6–20)
CALCIUM: 8.1 mg/dL — AB (ref 8.9–10.3)
CO2: 24 mmol/L (ref 22–32)
CREATININE: 1.2 mg/dL (ref 0.61–1.24)
Chloride: 103 mmol/L (ref 101–111)
GFR calc Af Amer: >60 mL/min (ref >60)
GFR calc non Af Amer: >60 mL/min (ref >60)
GLUCOSE: 216 mg/dL — AB (ref 65–99)
Potassium: 4.1 mmol/L (ref 3.5–5.1)
Sodium: 133 mmol/L — ABNORMAL LOW (ref 135–145)

## 2015-09-13 LAB — EXPECTORATED SPUTUM ASSESSMENT W REFEX TO RESP CULTURE

## 2015-09-13 LAB — EXPECTORATED SPUTUM ASSESSMENT W GRAM STAIN, RFLX TO RESP C

## 2015-09-13 LAB — GLUCOSE, CAPILLARY
GLUCOSE-CAPILLARY: 353 mg/dL — AB (ref 65–99)
GLUCOSE-CAPILLARY: 118 mg/dL — AB (ref 65–99)
Glucose-Capillary: 194 mg/dL — ABNORMAL HIGH (ref 65–99)
Glucose-Capillary: 314 mg/dL — ABNORMAL HIGH (ref 65–99)

## 2015-09-13 LAB — MAGNESIUM: Magnesium: 2 mg/dL (ref 1.7–2.4)

## 2015-09-13 MED ORDER — SODIUM CHLORIDE 0.9 % IV BOLUS (SEPSIS)
500.0000 mL | Freq: Once | INTRAVENOUS | Status: AC
  Administered 2015-09-13: 500 mL via INTRAVENOUS

## 2015-09-13 NOTE — Progress Notes (Signed)
Patient family called back this afternoon to discuss plan of care for patient. Sister has been in touch with patient since last contact. Reports:  Patient wants to go home with his mother and sister at discharge. [facility] with family assistance. Sister has spoken to [place] and reports SW can make contact for a time for patient to come in for assessment. [place] number:  [phone]  [phone]. Number was called and message left for SW to call back regarding assistance with setting patient up for date of assessment.  Patient at this time not in agreement with SNF placement.  Family is all right with this as long as patient goes into rehab at [facility] for SA.  Patient mother will be caring for patient at home and he will DC to her home per sister.   Sister can be reached at:  [phone].  Will have weekday SW follow up.  Deretha Emory, MSW Clinical Social Work: [employer] [phone]

## 2015-09-13 NOTE — Clinical Social Work Note (Signed)
Clinical Social Work Assessment  Patient Details  Name: [name] MRN: [id number] Date of Birth: 07-12-1955  Date of referral:  09/13/15               Reason for consult:  Facility Placement                Permission sought to share information with:  Case Manager, Family Supports Permission granted to share information::  Yes, Verbal Permission Granted  Name::        Agency::     Relationship::  sister: [name] Information:     Housing/Transportation Living arrangements for the past 2 months:  Actor of Information:  Medical Team, Other (Comment Required) (other family members: sister) Patient Interpreter Needed:  None Criminal Activity/Legal Involvement Pertinent to Current Situation/Hospitalization:  No - Comment as needed Significant Relationships:  Other Family Members, Parents, Friend Lives with:  Friends Do you feel safe going back to the place where you live?  Yes Need for family participation in patient care:  Yes (Comment) (family calling and giving information)  Care giving concerns:  LCSW received call back from patient sister: [name]. [name] reports patient has not been taking care of himself prior to hospitalization. Reports pt uses drugs and alcohol which is how he has medically gotten to this point.  Reports he lives with friends in the community. Patient does have a 32 year old mother involved in supportive care only and his sister who is also supportive, but reports she is having back surgery and unable to care for patient.  [name] feels patient would be best cared for in SNF/nursing home.   Social Worker assessment / plan:  LCSW attempted to meet with patient. Patient sleeping soundly and would not awake. Sister has given most information regarding patient care.  Sister reports he cannot go back to where he was living/or should not go back as this would not be helpful to getting him stronger or better. She and his mother have been in constant  contact with him and urging him to go to SNF.  Patient has declined and LCSW reports he has that right, but will also discuss placement with him.  At this time, no consent from patient to fax information for ST rehab. Patient has been to SNF in past:  [facility]:  2015.  [facility] is exisiting. If patient is in agreement, will begin work up for SNF. If not he will dc back home with home health and self care. Will have weekday SW to follow up.  Employment status:  Retired, Disabled (Comment on whether or not currently receiving Disability) Insurance information:  Teacher, English as a foreign language PT Recommendations:  Skilled Nursing Facility Information / Referral to community resources:  Skilled Nursing Facility  Patient/Family's Response to care:  Patient is not in agreement at this time for SNF  Patient/Family's Understanding of and Emotional Response to Diagnosis, Current Treatment, and Prognosis:  Family seems very understanding and open to SNF as this is patient's best options for remaining healthy . Patient unable to wake up and unknown of his thoughts with regards to SNF at DC. Family reports he has been reluctant and refusing, but they continue to push him for SNF to keep him alive.  Emotional Assessment Appearance:  Appears older than stated age Attitude/Demeanor/Rapport:  Uncooperative, Other (sleeping and would not take up) Affect (typically observed):  Stable Orientation:  Oriented to Self, Oriented to Place Alcohol / Substance use:  Alcohol Use, Illicit Drugs, Tobacco  Use Psych involvement (Current and /or in the community):  No (Comment), Outpatient Provider  Discharge Needs  Concerns to be addressed:  Care Coordination Readmission within the last 30 days:  No Current discharge risk:  Substance Abuse Barriers to Discharge:  English as a second language teacher, Continued Medical Work up   Cordella Register 09/13/2015, 2:50 PM

## 2015-09-13 NOTE — Evaluation (Signed)
Physical Therapy Evaluation Patient Details Name: [name] MRN: [id number] DOB: Jun 02, 1955 Today's Date: 09/13/2015   History of Present Illness  60 yo male admitted with heart failure. Hx of ETOH abuse, DM, HTN, HF, CAD, drug use, MI, mental d/o, CHF  Clinical Impression  On eval, pt required Min assist for mobility-walked ~15 feet with RW. Pt is very unsteady and unsafe at this time. High fall risk. Will continue to follow and progress activity as able. Recommend ST rehab at SNF at this time.     Follow Up Recommendations SNF    Equipment Recommendations  None recommended by PT    Recommendations for Other Services       Precautions / Restrictions Precautions Precautions: Fall Restrictions Weight Bearing Restrictions: No      Mobility  Bed Mobility Overal bed mobility: Needs Assistance Bed Mobility: Supine to Sit;Sit to Supine     Supine to sit: Min guard Sit to supine: Min guard   General bed mobility comments: close guard for safety  Transfers Overall transfer level: Needs assistance Equipment used: Rolling walker (2 wheeled) Transfers: Sit to/from Stand Sit to Stand: Min assist         General transfer comment: Assist to rise, stabilize, control descent.   Ambulation/Gait Ambulation/Gait assistance: Min assist Ambulation Distance (Feet): 15 Feet (x2) Assistive device: Rolling walker (2 wheeled) Gait Pattern/deviations: Step-through pattern;Decreased stride length     General Gait Details: assist to stabiize and maneuver safely with walker. very unsteady. high risk for falls.   Stairs            Wheelchair Mobility    Modified Rankin (Stroke Patients Only)       Balance Overall balance assessment: Needs assistance         Standing balance support: Bilateral upper extremity supported;During functional activity Standing balance-Leahy Scale: Poor                               Pertinent Vitals/Pain Pain  Assessment: No/denies pain    Home Living Family/patient expects to be discharged to:: Unsure                      Prior Function Level of Independence: Independent               Hand Dominance        Extremity/Trunk Assessment   Upper Extremity Assessment: Generalized weakness           Lower Extremity Assessment: Generalized weakness      Cervical / Trunk Assessment: Normal  Communication   Communication: Expressive difficulties  Cognition Arousal/Alertness: Awake/alert Behavior During Therapy: WFL for tasks assessed/performed Overall Cognitive Status: Within Functional Limits for tasks assessed                      General Comments      Exercises        Assessment/Plan    PT Assessment Patient needs continued PT services  PT Diagnosis Difficulty walking;Generalized weakness   PT Problem List Decreased strength;Decreased activity tolerance;Decreased balance;Decreased mobility;Decreased knowledge of use of DME  PT Treatment Interventions DME instruction;Gait training;Functional mobility training;Therapeutic activities;Patient/family education;Balance training;Therapeutic exercise   PT Goals (Current goals can be found in the Care Plan section) Acute Rehab PT Goals Patient Stated Goal: to possibly go to rehab (detox) PT Goal Formulation: With patient Time For Goal Achievement: 09/27/15 Potential to Achieve  Goals: Good    Frequency Min 3X/week   Barriers to discharge        Co-evaluation               End of Session Equipment Utilized During Treatment: Gait belt Activity Tolerance: Patient tolerated treatment well Patient left: in bed;with call bell/phone within reach;with bed alarm set           Time: 1130-1155 PT Time Calculation (min) (ACUTE ONLY): 25 min   Charges:   PT Evaluation $PT Eval Low Complexity: 1 Procedure PT Treatments $Gait Training: 8-22 mins   PT G Codes:        Rebeca Alert,  MPT Pager: [phone]

## 2015-09-13 NOTE — Progress Notes (Signed)
PROGRESS NOTE    [name]  WUJ:[id number] DOB: [birth year] DOA: 09/07/2015 [facility]    Brief Narrative: [name] is a 60 y.o. male with medical history significant of chronic combined systolic and diastolic congestive heart failure, last transthoracic echocardiogram performed on 03/10/2015 that showed an EF of 30-35% with grade 3 diastolic dysfunction, history of hypertension, insulin-dependent diabetes mellitus, coronary artery disease, as well as having ongoing alcohol abuse as well as history of cocaine use. He presents to the emergency room, intoxicated, reports last drinking the  Morning of admission. He came in with complaints of shortness of breath becoming progressively worse over the past week.  Patient admitted with acute on chronic combine Heart failure exacerbation, he was treated for the same with IV lasix. He was started on CIWA due to high risk for alcohol withdrawal.      Assessment & Plan:   Principal Problem:   Acute on chronic combined systolic (congestive) and diastolic (congestive) heart failure (HCC) Active Problems:   Diabetes mellitus, type II (HCC)   Hypertension   Cardiomyopathy, nonischemic (HCC)   ETOH abuse   Alcohol dependence with withdrawal delirium (HCC)   CHF (congestive heart failure) (HCC)   Pleural effusion on right   Insulin dependent diabetes mellitus (HCC)   Hepatitis, viral  Acute on chronic combined systolic and diastolic congestive heart failure Last ECHO ; 03/10/2015 that showed an EF of 30-35% with grade 3 diastolic dysfunction  lisinopril, imdur held , coreg dose reduced due to soft bp and elevation of cr.  Repeat echo with reduced EF at 25-35%,Cardiology following s/p IV Lasix 40 mg twice daily since admission, lasix d/ced on 5/17 due to elevation of cr, low dose oral lasix restarted on 5/18, cr stable so far on low dose lasix Developed hypotension on 5/21, coreg and lasix held, 500c ns bolus given  Cr  elevation: from overdiuresing? Lasix held on 5/17, resumed on 5/18, lasix titration per cardiology, cr stable  Pleural effusion: ct chest + moderate pleural effusion bilaterally with extensive lower lobe airspace consolidation, + atherosclerotic calcification.  s/p thoracentesis on 5/19 due to persistent sob, with 1.8 liter of  pleural fluids drained. Pleural fluids lymphocyte predominent, total protein less than 3, ldh 45, gram stain no organism, likely transudate Currently no leukocytosis, no fever, Lasix held on 5/21 due to hypotension, sbp in 80's.  NSVT: 6 beats on tele on 5/20, iv mag given, continue coreg. Keep k >4, mag>2.  Chronic alcohol abuse; reported history of seizure from alcohol withdrawal, at risk for DT. CIWA protocol.   Type 2 diabetes mellitus, insulin-dependent, a1c 11.7, metformin held, on ssi here,  patient likely noncompliant with meds and diet, Start lantus and ssi and optimize dose  Hypertension: lisinopril, imdur held and coreg dose resuded due to soft SBP  Hypotension sbp in the 80's on 5/21, hold coreg and lasix, 500cc ns bolus ordered.  History of coronary artery disease. Currently denies chest pain, troponin negative at 0.08 in the emergency department. Place him on continuous cardiac monitoring. Last cath in 2014 moderate nonobstructive, nonischemic cardiomyopathy, patient has not been following up with cardiology  Depression;  unclear if he has been taking Zoloft, Wellbutrin, hold to avoid sedation.   Substance abuse;  reported continued drink heavily and use drug, urine tox screen +cocaine ,  Patient reported substance abuse due to stress at home, advise patent to seem family counseling by pmd at [place].  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  hcv rna significantly elevated at [id number]. [facility] is [facility]. He is  advised to stop alcohol and illicit drug use Patient report [facility] plan to treat his hepc in the near future.  DVT prophylaxis: Lovenox Code Status: Full code.  Family Communication: care discussed with patient  Disposition Plan:  Pre RN pam : Pt was staying with a friend before this admission and will not be able to go back. patient's sister Lupita Leash said that his wife is 59 and unable to take care of him, so disposition after discharge is uncertain. Patient states that [facility] has some kinds of housing plan, case manager and social worker to assist in discharge planning  Consultants:   cardiology  Procedures:   US guided thoracentesis with 1.8 liter removed  Antimicrobials:   none   Subjective: Weak , + productive cough, bp low, no fever, no leukocytosis,  denies chest pain,No edema,  Now weaned off to room air  No overt sign of alcohol withdrawal  Objective: Filed Vitals:   09/12/15 1936 09/13/15 0604 09/13/15 1432 09/13/15 1452  BP: 113/78 106/67 89/49 96/62   Pulse: 78 82 65 68  Temp: 97.8 F (36.6 C) 98 F (36.7 C) 97.9 F (36.6 C)   TempSrc: Oral Oral Oral   Resp: 20 20 20    Height:      Weight:  72.8 kg (160 lb 7.9 oz)    SpO2: 100% 98% 98%     Intake/Output Summary (Last 24 hours) at 09/13/15 1501 Last data filed at 09/13/15 1856  Gross per 24 hour  Intake     50 ml  Output    650 ml  Net   -600 ml   Filed Weights   09/11/15 0444 09/12/15 0621 09/13/15 0604  Weight: 73.2 kg (161 lb 6 oz) 71.85 kg (158 lb 6.4 oz) 72.8 kg (160 lb 7.9 oz)    Examination:  General exam: weak Respiratory system: Respiratory effort normal. Bilateral crackles has largely resolved, diminished at basis, No wheezing, no rhonchi,  Cardiovascular system: S1 & S2 heard, RRR. No pedal edema. Gastrointestinal system: Abdomen is nondistended, soft and nontender. No organomegaly or masses felt. Normal bowel sounds heard. Central nervous system: aaox3, no focal weakness, no  asterixis Extremities: Symmetric 5 x 5 power. Skin: No rashes, lesions or ulcers     Data Reviewed: I have personally reviewed following labs and imaging studies  CBC:  Recent Labs Lab 09/07/15 0805 09/09/15 0317  WBC 6.3 6.4  NEUTROABS 3.4  --   HGB 15.2 13.2  HCT 43.2 38.7*  MCV 85.9 87.2  PLT 209 213   Basic Metabolic Panel:  Recent Labs Lab 09/08/15 0304 09/09/15 0317 09/10/15 0516 09/11/15 0507 09/12/15 0516 09/13/15 0538  NA 140 138 133* 133* 132* 133*  K 3.7 4.0 3.6 4.3 4.1 4.1  CL 106 104 103 102 100* 103  CO2 23 27 23 26 24 24   GLUCOSE 130* 150* 231* 295* 271* 216*  BUN 18 29* 25* 24* 19 17  CREATININE 1.01 1.49* 1.09 1.17 1.15 1.20  CALCIUM 8.7* 8.5* 7.9* 8.1* 8.0* 8.1*  MG 1.9  --  1.8 1.8 1.8 2.0   GFR: Estimated Creatinine Clearance: 66.6 mL/min (by C-G formula based on Cr of 1.2). Liver Function Tests:  Recent Labs Lab 09/07/15 0805 09/10/15 0516  AST 55* 50*  ALT 42 35  ALKPHOS 85 82  BILITOT 1.4* 1.0  PROT 7.0 6.0*  ALBUMIN 3.1* 2.5*   No results for input(s): LIPASE, AMYLASE in the last 168 hours. No results for input(s): AMMONIA in the last 168 hours. Coagulation Profile: No results for input(s): INR, PROTIME in the last 168 hours. Cardiac Enzymes: No results for input(s): CKTOTAL, CKMB, CKMBINDEX, TROPONINI in the last 168 hours. BNP (last 3 results) No results for input(s): PROBNP in the last 8760 hours. HbA1C: No results for input(s): HGBA1C in the last 72 hours. CBG:  Recent Labs Lab 09/12/15 1152 09/12/15 1712 09/12/15 2112 09/13/15 0733 09/13/15 1217  GLUCAP 219* 193* 262* 194* 353*   Lipid Profile: No results for input(s): CHOL, HDL, LDLCALC, TRIG, CHOLHDL, LDLDIRECT in the last 72 hours. Thyroid Function Tests:  Recent Labs  09/12/15 0516  TSH 2.455   Anemia Panel: No results for input(s): VITAMINB12, FOLATE, FERRITIN, TIBC, IRON, RETICCTPCT in the last 72 hours. Sepsis Labs: No results for input(s):  PROCALCITON, LATICACIDVEN in the last 168 hours.  Recent Results (from the past 240 hour(s))  MRSA PCR Screening     Status: None   Collection Time: 09/07/15  5:28 PM  Result Value Ref Range Status   MRSA by PCR NEGATIVE NEGATIVE Final    Comment:        The GeneXpert MRSA Assay (FDA approved for NASAL specimens only), is one component of a comprehensive MRSA colonization surveillance program. It is not intended to diagnose MRSA infection nor to guide or monitor treatment for MRSA infections.   Culture, body fluid-bottle     Status: None (Preliminary result)   Collection Time: 09/11/15  1:44 PM  Result Value Ref Range Status   Specimen Description FLUID PLEURAL  Final   Special Requests BOTTLES DRAWN AEROBIC AND ANAEROBIC 10CC  Final   Culture   Final    NO GROWTH 2 DAYS Performed at [facility]    Report Status PENDING  Incomplete  Gram stain     Status: None   Collection Time: 09/11/15  1:44 PM  Result Value Ref Range Status   Specimen Description FLUID PLEURAL  Final   Special Requests NONE  Final   Gram Stain   Final    ABUNDANT WBC PRESENT, PREDOMINANTLY MONONUCLEAR NO ORGANISMS SEEN Performed at [facility]    Report Status 09/11/2015 FINAL  Final         Radiology Studies: No results found.      Scheduled Meds: . antiseptic oral rinse  7 mL Mouth Rinse BID  . aspirin EC  81 mg Oral Daily  . enoxaparin (LOVENOX) injection  40 mg Subcutaneous Q24H  . folic acid  1 mg Oral Daily  . guaiFENesin  600 mg Oral BID  . insulin aspart  0-15 Units Subcutaneous TID WC  . insulin aspart  0-5 Units Subcutaneous QHS  . insulin aspart  3 Units Subcutaneous TID WC  . insulin glargine  10 Units Subcutaneous QHS  . multivitamin with minerals  1 tablet Oral Daily  . pravastatin  10 mg Oral q1800  . sodium chloride flush  3 mL Intravenous Q12H  . thiamine  100 mg Oral Daily  . traZODone  100 mg Oral QHS   Continuous Infusions:    LOS: 6 days     Time spent: 25 minutes.     Albertine Grates, MD PhD [facility] Pager [phone]  If 7PM-7AM, please contact night-coverage [email or web address] Password [email or web address] 09/13/2015, 3:01  PM  

## 2015-09-13 NOTE — Progress Notes (Signed)
Patient ID: [name], male   DOB: [birth year], 60 y.o.   MRN: [id number]   Patient Name: [name] Date of Encounter: 09/13/2015     Principal Problem:   Acute on chronic combined systolic (congestive) and diastolic (congestive) heart failure (HCC) Active Problems:   Diabetes mellitus, type II (HCC)   Hypertension   Cardiomyopathy, nonischemic (HCC)   ETOH abuse   Alcohol dependence with withdrawal delirium (HCC)   CHF (congestive heart failure) (HCC)   Pleural effusion on right   Insulin dependent diabetes mellitus (HCC)   Hepatitis, viral    SUBJECTIVE  NO Dyspnea or chest pain   CURRENT MEDS . antiseptic oral rinse  7 mL Mouth Rinse BID  . aspirin EC  81 mg Oral Daily  . carvedilol  3.125 mg Oral BID WC  . enoxaparin (LOVENOX) injection  40 mg Subcutaneous Q24H  . folic acid  1 mg Oral Daily  . furosemide  20 mg Oral Daily  . guaiFENesin  600 mg Oral BID  . insulin aspart  0-15 Units Subcutaneous TID WC  . insulin aspart  0-5 Units Subcutaneous QHS  . insulin aspart  3 Units Subcutaneous TID WC  . insulin glargine  10 Units Subcutaneous QHS  . multivitamin with minerals  1 tablet Oral Daily  . pravastatin  10 mg Oral q1800  . sodium chloride flush  3 mL Intravenous Q12H  . thiamine  100 mg Oral Daily  . traZODone  100 mg Oral QHS    OBJECTIVE  Filed Vitals:   09/12/15 0621 09/12/15 1433 09/12/15 1936 09/13/15 0604  BP: 98/65 115/76 113/78 106/67  Pulse: 76 75 78 82  Temp: 97.4 F (36.3 C) 97.4 F (36.3 C) 97.8 F (36.6 C) 98 F (36.7 C)  TempSrc: Oral Oral Oral Oral  Resp: 20 20 20 20   Height:      Weight: 71.85 kg (158 lb 6.4 oz)   72.8 kg (160 lb 7.9 oz)  SpO2: 100% 100% 100% 98%    Intake/Output Summary (Last 24 hours) at 09/13/15 0951 Last data filed at 09/13/15 4098  Gross per 24 hour  Intake      0 ml  Output   1000 ml  Net  -1000 ml   Filed Weights   09/11/15 0444 09/12/15 0621 09/13/15 0604  Weight: 73.2 kg (161 lb 6 oz)  71.85 kg (158 lb 6.4 oz) 72.8 kg (160 lb 7.9 oz)    PHYSICAL EXAM  General: Pleasant, NAD. Neuro: Alert and oriented X 3. Moves all extremities spontaneously. Psych: Normal affect. HEENT:  Normal  Neck: Supple without bruits or JVD. Lungs:  Resp regular and improved aeration on rgith  Heart: RRR no s3, s4, or murmurs. Abdomen: Soft, non-tender, non-distended, BS + x 4.  Extremities: No clubbing, cyanosis or edema. DP/PT/Radials 2+ and equal bilaterally.  Accessory Clinical Findings  CBC No results for input(s): WBC, NEUTROABS, HGB, HCT, MCV, PLT in the last 72 hours. Basic Metabolic Panel  Recent Labs  09/12/15 0516 09/13/15 0538  NA 132* 133*  K 4.1 4.1  CL 100* 103  CO2 24 24  GLUCOSE 271* 216*  BUN 19 17  CREATININE 1.15 1.20  CALCIUM 8.0* 8.1*  MG 1.8 2.0   Liver Function Tests No results for input(s): AST, ALT, ALKPHOS, BILITOT, PROT, ALBUMIN in the last 72 hours.  TELE  NSR rates 80's no arrhythmia 09/13/2015   Radiology/Studies  Dg Chest 1 View  09/11/2015  CLINICAL DATA:  Status post right thoracentesis EXAM: CHEST 1 VIEW COMPARISON:  09/07/2015 chest radiographs and 09/11/2015 left lateral decubitus chest radiograph. FINDINGS: Stable cardiomediastinal silhouette with top-normal heart size. No pneumothorax. Small residual right pleural effusion, decreased. Small left pleural effusion, stable. No overt pulmonary edema. Left greater than right bibasilar patchy lung opacities, stable on the left and decreased on the right. IMPRESSION: 1. No pneumothorax. 2. Small residual right pleural effusion, decreased. Mild right lung base opacity, decreased, favor atelectasis. 3. Stable small left pleural effusion and left basilar lung opacity, favor atelectasis. Electronically Signed   By: Delbert Phenix M.D.   On: 09/11/2015 14:10   Dg Chest 2 View  09/07/2015  CLINICAL DATA:  Increased shortness of breath, weakness and lethargy. History of smoking. EXAM: CHEST  2 VIEW  COMPARISON:  03/10/2015; 02/20/2014; 08/12/2012 FINDINGS: Grossly unchanged cardiac silhouette and mediastinal contours with atherosclerotic plaque within the thoracic aorta. The pulmonary vasculature is indistinct with cephalization of flow. Interval development of small to moderate sized layering bilateral effusions with associated bilateral mid and lower lung heterogeneous / consolidative opacities. No pneumothorax. No acute osseous abnormalities. IMPRESSION: Findings compatible with pulmonary edema with small to moderate size bilateral effusions and associated bilateral mid and lower lung opacities, atelectasis versus infiltrate. Electronically Signed   By: Simonne Come M.D.   On: 09/07/2015 08:45   Ct Chest Wo Contrast  09/09/2015  CLINICAL DATA:  Progressive shortness of breath for 1 week EXAM: CT CHEST WITHOUT CONTRAST TECHNIQUE: Multidetector CT imaging of the chest was performed following the standard protocol without IV contrast. COMPARISON:  Chest radiograph Sep 07, 2015 FINDINGS: Mediastinum/Lymph Nodes: Thyroid appears unremarkable. There are multiple small lymph nodes throughout the mediastinum. There are mildly enlarged lymph nodes anterior to the carina, largest lymph node measuring 1.7 x 1.4 cm. There are borderline prominent lymph nodes in the hila ; the absence of intravenous contrast makes delineation of lymph nodes from adjacent vessels difficult in this circumstance. There is prominence of the ascending aorta with a maximum transverse diameter of 4.4 x 4.0 cm. The visualized great vessels appear unremarkable on this noncontrast enhanced study except for mild calcification at the origins of the left and right common carotid arteries. There are foci of atherosclerotic calcification in the aorta. There are multiple foci of coronary artery calcification. The pericardium is not thickened. Lungs/Pleura: There are sizable free-flowing pleural effusions bilaterally. There is bibasilar airspace  consolidation. There is patchy infiltrate in the posterior segment of the left upper lobe. There are areas of bronchiectatic change in the upper lobes bilaterally. No interstitial edema is evident. Upper abdomen: In the visualized upper abdomen, atherosclerotic calcification is noted in the aorta and left renal artery. Visualized upper abdominal structures otherwise appear unremarkable. Musculoskeletal: There is degenerative change in the thoracic spine. There are no blastic or lytic bone lesions. IMPRESSION: Moderate free-flowing pleural effusions bilaterally with extensive lower lobe airspace consolidation bilaterally. Patchy infiltrate is noted in the posterior segment of the left upper lobe. No focal interstitial edema appreciable. Atherosclerotic calcification is noted in the aorta with multiple foci of coronary artery calcification noted. Prominence of the ascending thoracic aorta with a maximum transverse diameter 4.4 x 4.0 cm. Recommend annual imaging followup by CTA or MRA. This recommendation follows 2010 ACCF/AHA/AATS/ACR/ASA/SCA/SCAI/SIR/STS/SVM Guidelines for the Diagnosis and Management of Patients with Thoracic Aortic Disease. Circulation. 2010; 121: V361-Q244. Mildly prominent lymph nodes, likely reactive in etiology. Electronically Signed   By: Bretta Bang III M.D.   On: 09/09/2015 14:27  Dg Chest Left Decubitus  09/11/2015  CLINICAL DATA:  Short of breath. History of hypertension and diabetes. Pleural effusions on recent CT. EXAM: CHEST - LEFT DECUBITUS COMPARISON:  Chest CT, 09/09/2015.  Chest radiograph, 09/07/2015. FINDINGS: Pleural fluid layers along the dependent aspects of the right and left hemi thoraces on the decubitus view. There is additional lung base opacity bilaterally that is likely atelectasis. Pneumonia is not excluded. There is no evidence of pulmonary edema. IMPRESSION: Bilateral free-flowing pleural effusions. Electronically Signed   By: Amie Portland M.D.   On:  09/11/2015 09:55   US Abdomen Limited Ruq  09/10/2015  CLINICAL DATA:  Elevated liver function tests.  Initial encounter. EXAM: US ABDOMEN LIMITED - RIGHT UPPER QUADRANT COMPARISON:  Abdominal ultrasound 02/21/2014. FINDINGS: Gallbladder: No gallstones or wall thickening visualized. No sonographic Murphy sign noted by sonographer. Common bile duct: Diameter: 0.2 cm Liver: No focal lesion identified. Within normal limits in parenchymal echogenicity. IMPRESSION: Negative exam. Electronically Signed   By: Drusilla Kanner M.D.   On: 09/10/2015 15:50   US Thoracentesis Asp Pleural Space W/img Guide  09/11/2015  INDICATION: Shortness of breath, congestive heart failure. Large right pleural effusion. Request diagnostic and therapeutic thoracentesis. EXAM: ULTRASOUND GUIDED RIGHT THORACENTESIS MEDICATIONS: None. COMPLICATIONS: None immediate. PROCEDURE: An ultrasound guided thoracentesis was thoroughly discussed with the patient and questions answered. The benefits, risks, alternatives and complications were also discussed. The patient understands and wishes to proceed with the procedure. Written consent was obtained. Ultrasound was performed to localize and mark an adequate pocket of fluid in the right chest. The area was then prepped and draped in the normal sterile fashion. 1% Lidocaine was used for local anesthesia. Under ultrasound guidance a Safe-T-Centesis catheter was introduced. Thoracentesis was performed. The catheter was removed and a dressing applied. FINDINGS: A total of approximately 1.8 L of clear, amber colored fluid was removed. Samples were sent to the laboratory as requested by the clinical team. IMPRESSION: Successful ultrasound guided right thoracentesis yielding 1.8 L of pleural fluid. Read by: Brayton El PA-C Electronically Signed   By: Gilmer Mor D.O.   On: 09/11/2015 13:54   2D ECHO: 09/10/2015 LV EF: 25% - 30% Study Conclusions - Left ventricle: The cavity size was moderately  dilated. Systolic  function was severely reduced. The estimated ejection fraction  was in the range of 25% to 30%. - Aortic valve: There was trivial regurgitation. - Mitral valve: There was moderate regurgitation. - Left atrium: The atrium was mildly dilated. - Right ventricle: Systolic function was mildly to moderately  reduced. - Pulmonary arteries: Systolic pressure was moderately increased.  PA peak pressure: 40 mm Hg (S). - Pericardium, extracardiac: A trivial pericardial effusion was  identified. There was a left pleural effusion.   ASSESSMENT AND PLAN  LAM MCCUBBINS is a 60 y.o. male with a history of NICM/chronic combined S/D CHF (EF 30-35%/G3DD), HTN, IDDM, CAD and ongoing alcohol and cocaine abuse who presented to [facility] on 09/07/15 with SOB while intoxicated.   Acute on chronic combined S/D CHF: reapeat 2D ECHO with mildly worsening EF to 25-30%, mod MR, mild LAE, mild-mod RV dysfunction, PA pressure 40, trivial pericardial effusion with left pleural effusion.  -- BNP of 1076, chest x-ray showing evidence of pulmonary edema. S/P thoracentesis with 1.8 Liters removed.   -- continue lasix now PO  -- Continue Coreg 3.125mg  BID and lisinopril held due to AKI and soft BPs. Not overtly volume overloaded on exam. Creat is stable with diuresis.  Elevated troponin: 0.08--> 0.06. Low and flat c/w demand ischemia. No plans for cath   HTN: BPs soft. Lisinopril and imdur held.   Type 2 diabetes mellitus: metformin held, HgA1c 11.7  History of coronary artery disease: non obst on cath in 2014.   Polysubstance abuse: UDS + cocaine, benzos and was intoxicated on alcohol on admission. On CIWA protocol. Long term prognosis very poor if he cannot quit drinking and using drugs.   Non compliance: does not follow up as an outpatient and continues to abuse drugs and alcohol   Disposition unclear that he has a place to go home to  But should be able to go home or be placed Monday     [facility]

## 2015-09-13 NOTE — Progress Notes (Signed)
LCSW has been consulted for SNF/discharge planning Patient originally from home, but PT has seen patient and recommending SNF. Patient sleeping soundly in room, RN reports he has been this way most of the day. Currently not in withdrawal, CIWA was reviewed.  LCSW called patient sister Lupita Leash for more information and assistance with disposition. Message left for sister.    Will complete assessment of needs once call returned and able to meet with patient.  Deretha Emory, MSW Clinical Social Work: [facility] [employer] [phone]

## 2015-09-14 DIAGNOSIS — F10231 Alcohol dependence with withdrawal delirium: Secondary | ICD-10-CM

## 2015-09-14 DIAGNOSIS — Z515 Encounter for palliative care: Secondary | ICD-10-CM

## 2015-09-14 DIAGNOSIS — J948 Other specified pleural conditions: Secondary | ICD-10-CM

## 2015-09-14 LAB — MAGNESIUM: Magnesium: 1.9 mg/dL (ref 1.7–2.4)

## 2015-09-14 LAB — GLUCOSE, CAPILLARY
GLUCOSE-CAPILLARY: 232 mg/dL — AB (ref 65–99)
GLUCOSE-CAPILLARY: 188 mg/dL — AB (ref 65–99)
Glucose-Capillary: 233 mg/dL — ABNORMAL HIGH (ref 65–99)
Glucose-Capillary: 284 mg/dL — ABNORMAL HIGH (ref 65–99)

## 2015-09-14 LAB — COMPREHENSIVE METABOLIC PANEL
ALK PHOS: 81 U/L (ref 38–126)
ALT: 32 U/L (ref 17–63)
AST: 38 U/L (ref 15–41)
Albumin: 2.5 g/dL — ABNORMAL LOW (ref 3.5–5.0)
Anion gap: 6 (ref 5–15)
BUN: 19 mg/dL (ref 6–20)
CALCIUM: 8.1 mg/dL — AB (ref 8.9–10.3)
CO2: 23 mmol/L (ref 22–32)
CREATININE: 1.12 mg/dL (ref 0.61–1.24)
Chloride: 105 mmol/L (ref 101–111)
GFR calc non Af Amer: >60 mL/min (ref >60)
GLUCOSE: 251 mg/dL — AB (ref 65–99)
Potassium: 4.4 mmol/L (ref 3.5–5.1)
SODIUM: 134 mmol/L — AB (ref 135–145)
Total Bilirubin: 0.4 mg/dL (ref 0.3–1.2)
Total Protein: 5.9 g/dL — ABNORMAL LOW (ref 6.5–8.1)

## 2015-09-14 LAB — CBC
HCT: 39.4 % (ref 39.0–52.0)
HEMOGLOBIN: 13.4 g/dL (ref 13.0–17.0)
MCH: 29.7 pg (ref 26.0–34.0)
MCHC: 34 g/dL (ref 30.0–36.0)
MCV: 87.4 fL (ref 78.0–100.0)
Platelets: 216 10*3/uL (ref 150–400)
RBC: 4.51 MIL/uL (ref 4.22–5.81)
RDW: 12.2 % (ref 11.5–15.5)
WBC: 4.8 10*3/uL (ref 4.0–10.5)

## 2015-09-14 LAB — PH, BODY FLUID: PH, BODY FLUID: 7.9

## 2015-09-14 LAB — CORTISOL: Cortisol, Plasma: 9.9 ug/dL

## 2015-09-14 LAB — BRAIN NATRIURETIC PEPTIDE: B NATRIURETIC PEPTIDE 5: 862.7 pg/mL — AB (ref 0.0–100.0)

## 2015-09-14 MED ORDER — LORAZEPAM 2 MG/ML IJ SOLN
0.5000 mg | Freq: Once | INTRAMUSCULAR | Status: AC
  Administered 2015-09-14: 0.5 mg via INTRAVENOUS
  Filled 2015-09-14: qty 1

## 2015-09-14 MED ORDER — FUROSEMIDE 20 MG PO TABS: 20.0000 mg | ORAL_TABLET | Freq: Every day | ORAL | Status: DC

## 2015-09-14 MED ORDER — FUROSEMIDE 10 MG/ML IJ SOLN
40.0000 mg | Freq: Every day | INTRAMUSCULAR | Status: DC
  Administered 2015-09-14 – 2015-09-16 (×3): 40 mg via INTRAVENOUS
  Filled 2015-09-14 (×3): qty 4

## 2015-09-14 MED ORDER — INSULIN GLARGINE 100 UNIT/ML ~~LOC~~ SOLN
15.0000 [IU] | Freq: Every day | SUBCUTANEOUS | Status: DC
Start: 2015-09-14 — End: 2015-09-16
  Administered 2015-09-14 – 2015-09-15 (×2): 15 [IU] via SUBCUTANEOUS
  Filled 2015-09-14 (×4): qty 0.15

## 2015-09-14 MED ORDER — CARVEDILOL 3.125 MG PO TABS
3.1250 mg | ORAL_TABLET | Freq: Two times a day (BID) | ORAL | Status: DC
  Administered 2015-09-14 – 2015-09-15 (×3): 3.125 mg via ORAL
  Filled 2015-09-14 (×3): qty 1

## 2015-09-14 MED ORDER — MAGNESIUM SULFATE 2 GM/50ML IV SOLN
2.0000 g | Freq: Once | INTRAVENOUS | Status: AC
  Administered 2015-09-14: 2 g via INTRAVENOUS
  Filled 2015-09-14: qty 50

## 2015-09-14 NOTE — Care Management Important Message (Signed)
Important Message  Patient Details  Name: TAHJEE MONDAY MRN: [id number] Date of Birth: 1956/01/02   Medicare Important Message Given:  Yes    Haskell Flirt 09/14/2015, 9:29 AMImportant Message  Patient Details  Name: WALE FERRELLI MRN: [id number] Date of Birth: 14-Mar-1956   Medicare Important Message Given:  Yes    Haskell Flirt 09/14/2015, 9:29 AM

## 2015-09-14 NOTE — Progress Notes (Signed)
PROGRESS NOTE    [name]  ZOX:[id number] DOB: 02-17-1956 DOA: 09/07/2015 PCP: [facility]    Brief Narrative: [name] is a 60 y.o. male with medical history significant of chronic combined systolic and diastolic congestive heart failure, last transthoracic echocardiogram performed on 03/10/2015 that showed an EF of 30-35% with grade 3 diastolic dysfunction, history of hypertension, insulin-dependent diabetes mellitus, coronary artery disease, as well as having ongoing alcohol abuse as well as history of cocaine use. He presents to the emergency room, intoxicated, reports last drinking the  Morning of admission. He came in with complaints of shortness of breath becoming progressively worse over the past week.  Patient admitted with acute on chronic combine Heart failure exacerbation, he was treated for the same with IV lasix. He was started on CIWA due to high risk for alcohol withdrawal.      Assessment & Plan:   Principal Problem:   Acute on chronic combined systolic (congestive) and diastolic (congestive) heart failure (HCC) Active Problems:   Diabetes mellitus, type II (HCC)   Hypertension   Cardiomyopathy, nonischemic (HCC)   ETOH abuse   Alcohol dependence with withdrawal delirium (HCC)   CHF (congestive heart failure) (HCC)   Pleural effusion on right   Insulin dependent diabetes mellitus (HCC)   Hepatitis, viral  Acute on chronic combined systolic and diastolic congestive heart failure Last ECHO ; 03/10/2015 that showed an EF of 30-35% with grade 3 diastolic dysfunction  lisinopril, imdur held , coreg dose reduced due to soft bp and elevation of cr.  Repeat echo with reduced EF at 25-35%,Cardiology following s/p IV Lasix 40 mg twice daily since admission, lasix d/ced on 5/17 due to elevation of cr, low dose oral lasix restarted on 5/18, cr stable so far on low dose lasix Developed hypotension on 5/21, coreg and lasix held, 500c ns bolus given on  5/21 bp better on 5/22, cardiology restart lasix, monitor bp and cr, repeat cxr with re accumulation of pleural effusion, cardiology recommended palliative care consult for goal of care, palliative care consulted.  Cr elevation: from overdiuresing? Lasix held on 5/17, resumed on 5/18, lasix titration per cardiology, cr stable  Pleural effusion: ct chest + moderate pleural effusion bilaterally with extensive lower lobe airspace consolidation, + atherosclerotic calcification.  s/p thoracentesis on 5/19 due to persistent sob, with 1.8 liter of  pleural fluids drained. Pleural fluids lymphocyte predominent, total protein less than 3, ldh 45, gram stain no organism, likely transudate Currently no leukocytosis, no fever, Lasix held on 5/21 due to hypotension, sbp in 80's. Restart lasix on 5/22, repeat cxr with re accumulation of pleural effusion  NSVT: 6 beats on tele on 5/20, iv mag given, continue coreg. Keep k >4, mag>2.  Chronic alcohol abuse; reported history of seizure from alcohol withdrawal, at risk for DT. on CIWA protocol.  No overt withdrawal problem.  Type 2 diabetes mellitus, insulin-dependent, a1c 11.7, metformin held, on ssi here,  patient admits has been noncompliant with meds and diet,  Start lantus and ssi and optimize dose  Hypertension:  lisinopril, imdur held and coreg dose resuded due to soft SBP  Hypotension sbp in the 80's on 5/21, hold coreg and lasix, 500cc ns bolus ordered. Restarted lasix on 5/22. Continue hold coreg/lisinopril and imdur.  History of coronary artery disease. Currently denies chest pain, troponin negative at 0.08 in the emergency department. Place him on continuous cardiac monitoring. Last cath in 2014 moderate nonobstructive, nonischemic cardiomyopathy, patient has not been  following up with cardiology  Depression;  unclear if he has been taking Zoloft, Wellbutrin, hold to avoid sedation.   Substance abuse;  reported continued drink heavily and use  drug, urine tox screen +cocaine ,  Patient reported substance abuse due to stress at home, advise patent to seem family counseling by pmd at [place].  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  hcv rna significantly elevated at [id number]. [facility]. He is advised to stop alcohol and illicit drug use Patient report [facility] plan to treat his hepc in the near future.  DVT prophylaxis: Lovenox Code Status: Full code.  Family Communication: care discussed with patient and his fiance in room Disposition Plan:  Pre RN pam : Pt was staying with a friend before this admission and will not be able to go back. patient's sister Lupita Leash said that his mother is 58 and unable to take care of him, so disposition after discharge is uncertain. Patient states that [facility] has some kinds of housing plan, case manager and social worker to assist in discharge planning Now palliative care consulted for goal of care  Consultants:   Cardiology  Palliative care  Procedures:   US guided thoracentesis with 1.8 liter removed  Antimicrobials:   none   Subjective: Weak , seems less cough today,  no fever, no leukocytosis, on room air at rest,  denies chest pain,No edema,    No overt sign of alcohol withdrawal  Objective: Filed Vitals:   09/13/15 1452 09/13/15 2036 09/14/15 0549 09/14/15 1318  BP: 96/62 122/77 112/69 139/88  Pulse: 68 84 76 85  Temp:  98.4 F (36.9 C) 97.8 F (36.6 C) 97.3 F (36.3 C)  TempSrc:  Oral Oral Oral  Resp:  Height:      Weight:   72.485 kg (159 lb 12.8 oz)   SpO2:  96% 98% 97%    Intake/Output Summary (Last 24 hours) at 09/14/15 1515 Last data filed at 09/14/15 1400  Gross per 24 hour  Intake    960 ml  Output   1150 ml  Net   -190 ml   Filed Weights   09/12/15 0621 09/13/15 0604 09/14/15 0549  Weight: 71.85 kg (158  lb 6.4 oz) 72.8 kg (160 lb 7.9 oz) 72.485 kg (159 lb 12.8 oz)    Examination:  General exam: weak Respiratory system: Respiratory effort normal. Bilateral crackles has largely resolved, diminished at basis, No wheezing, no rhonchi,  Cardiovascular system: S1 & S2 heard, RRR. No pedal edema. Gastrointestinal system: Abdomen is nondistended, soft and nontender. No organomegaly or masses felt. Normal bowel sounds heard. Central nervous system: aaox3, no focal weakness, no asterixis Extremities: Symmetric 5 x 5 power. Skin: No rashes, lesions or ulcers     Data Reviewed: I have personally reviewed following labs and imaging studies  CBC:  Recent Labs Lab 09/09/15 0317 09/14/15 0518  WBC 6.4 4.8  HGB 13.2 13.4  HCT 38.7* 39.4  MCV 87.2 87.4  PLT 213 216   Basic Metabolic Panel:  Recent Labs Lab 09/10/15 0516 09/11/15 0507 09/12/15 0516 09/13/15 0538 09/14/15 0518  NA 133* 133* 132* 133* 134*  K 3.6 4.3 4.1 4.1 4.4  CL 103 102 100* 103 105  CO2 GLUCOSE 231* 295* 271* 216* 251*  BUN 25*  24* 19 17 19   CREATININE 1.09 1.17 1.15 1.20 1.12  CALCIUM 7.9* 8.1* 8.0* 8.1* 8.1*  MG 1.8 1.8 1.8 2.0 1.9   GFR: Estimated Creatinine Clearance: 71 mL/min (by C-G formula based on Cr of 1.12). Liver Function Tests:  Recent Labs Lab 09/10/15 0516 09/14/15 0518  AST 50* 38  ALT 35 32  ALKPHOS 82 81  BILITOT 1.0 0.4  PROT 6.0* 5.9*  ALBUMIN 2.5* 2.5*   No results for input(s): LIPASE, AMYLASE in the last 168 hours. No results for input(s): AMMONIA in the last 168 hours. Coagulation Profile: No results for input(s): INR, PROTIME in the last 168 hours. Cardiac Enzymes: No results for input(s): CKTOTAL, CKMB, CKMBINDEX, TROPONINI in the last 168 hours. BNP (last 3 results) No results for input(s): PROBNP in the last 8760 hours. HbA1C: No results for input(s): HGBA1C in the last 72 hours. CBG:  Recent Labs Lab 09/13/15 0733 09/13/15 1217 09/13/15 1652  09/13/15 2041 09/14/15 0750  GLUCAP 194* 353* 118* 314* 233*   Lipid Profile: No results for input(s): CHOL, HDL, LDLCALC, TRIG, CHOLHDL, LDLDIRECT in the last 72 hours. Thyroid Function Tests:  Recent Labs  09/12/15 0516  TSH 2.455   Anemia Panel: No results for input(s): VITAMINB12, FOLATE, FERRITIN, TIBC, IRON, RETICCTPCT in the last 72 hours. Sepsis Labs: No results for input(s): PROCALCITON, LATICACIDVEN in the last 168 hours.  Recent Results (from the past 240 hour(s))  MRSA PCR Screening     Status: None   Collection Time: 09/07/15  5:28 PM  Result Value Ref Range Status   MRSA by PCR NEGATIVE NEGATIVE Final    Comment:        The GeneXpert MRSA Assay (FDA approved for NASAL specimens only), is one component of a comprehensive MRSA colonization surveillance program. It is not intended to diagnose MRSA infection nor to guide or monitor treatment for MRSA infections.   Culture, body fluid-bottle     Status: None (Preliminary result)   Collection Time: 09/11/15  1:44 PM  Result Value Ref Range Status   Specimen Description FLUID PLEURAL  Final   Special Requests BOTTLES DRAWN AEROBIC AND ANAEROBIC 10CC  Final   Culture   Final    NO GROWTH 3 DAYS Performed at [facility]    Report Status PENDING  Incomplete  Gram stain     Status: None   Collection Time: 09/11/15  1:44 PM  Result Value Ref Range Status   Specimen Description FLUID PLEURAL  Final   Special Requests NONE  Final   Gram Stain   Final    ABUNDANT WBC PRESENT, PREDOMINANTLY MONONUCLEAR NO ORGANISMS SEEN Performed at [facility]    Report Status 09/11/2015 FINAL  Final  Culture, expectorated sputum-assessment     Status: None   Collection Time: 09/13/15  6:27 PM  Result Value Ref Range Status   Specimen Description SPUTUM  Final   Special Requests NONE  Final   Sputum evaluation   Final    THIS SPECIMEN IS ACCEPTABLE. RESPIRATORY CULTURE REPORT TO FOLLOW.   Report Status  09/13/2015 FINAL  Final  Culture, respiratory (NON-Expectorated)     Status: None (Preliminary result)   Collection Time: 09/13/15  6:30 PM  Result Value Ref Range Status   Specimen Description SPUTUM  Final   Special Requests NONE  Final   Gram Stain   Final    FEW WBC PRESENT,BOTH PMN AND MONONUCLEAR NO ORGANISMS SEEN FEW GRAM POSITIVE COCCI IN CLUSTERS  IN PAIRS Performed at [facility]    Culture PENDING  Incomplete   Report Status PENDING  Incomplete         Radiology Studies: Dg Chest 2 View  09/14/2015  CLINICAL DATA:  Cough for 2 days. Patient admitted with heart failure and shortness of breath. Thoracentesis 2 days prior. EXAM: CHEST  2 VIEW COMPARISON:  Radiograph 09/11/2015 FINDINGS: Bilateral pleural effusions, small to moderate moderate in size, increased on the right from prior exam. Left pleural effusion is unchanged. There are associated bibasilar opacities, favor to be compressive atelectasis. Increasing interstitial prominence, likely minimal pulmonary edema. Cardiomediastinal contours are unchanged. No pneumothorax. IMPRESSION: 1. Increasing size of right pleural effusion post thoracentesis, small to moderate in size. Unchanged small to moderate left pleural effusion. Associated compressive atelectasis. 2. Increased interstitial opacities suspicious for mild pulmonary edema. 3. Overall findings suspicious for worsening CHF. Electronically Signed   By: Rubye Oaks M.D.   On: 09/14/2015 01:33        Scheduled Meds: . antiseptic oral rinse  7 mL Mouth Rinse BID  . aspirin EC  81 mg Oral Daily  . enoxaparin (LOVENOX) injection  40 mg Subcutaneous Q24H  . folic acid  1 mg Oral Daily  . furosemide  40 mg Intravenous Daily  . guaiFENesin  600 mg Oral BID  . insulin aspart  0-15 Units Subcutaneous TID WC  . insulin aspart  0-5 Units Subcutaneous QHS  . insulin aspart  3 Units Subcutaneous TID WC  . insulin glargine  15 Units Subcutaneous QHS  .  multivitamin with minerals  1 tablet Oral Daily  . pravastatin  10 mg Oral q1800  . sodium chloride flush  3 mL Intravenous Q12H  . thiamine  100 mg Oral Daily  . traZODone  100 mg Oral QHS   Continuous Infusions:    LOS: 7 days    Time spent: 25 minutes.     Albertine Grates, MD PhD [facility] Pager [phone]  If 7PM-7AM, please contact night-coverage [email or web address] Password [facility] 09/14/2015, 3:15 PM

## 2015-09-14 NOTE — Progress Notes (Signed)
Notified by CMT, pt had 6 beat run of vtach. No c/o pain. VSS. MD aware

## 2015-09-14 NOTE — Clinical Social Work Placement (Signed)
CSW received call from PMT NP, Lorinda Creed that patient is asking about SNF at [facility] in [place] or [place]. CSW spoke with social work Diplomatic Services operational officer at [employer] who confirmed that if patient has Medicare, they have to utilize that insurance rather than going to [place] SNF. Patient made aware & is now agreeable. CSW sent information out to [facility] & will follow-up with bed offers.     Lincoln Maxin, LCSW [facility] Clinical Social Worker cell #: [phone]    CLINICAL SOCIAL WORK PLACEMENT  NOTE  Date:  09/14/2015  Patient Details  Name: [name] MRN: [id number] Date of Birth: 07/22/1955  Clinical Social Work is seeking post-discharge placement for this patient at the Skilled  Nursing Facility level of care (*CSW will initial, date and re-position this form in  chart as items are completed):  Yes   Patient/family provided with [facility] Clinical Social Work Department's list of facilities offering this level of care within the geographic area requested by the patient (or if unable, by the patient's family).  Yes   Patient/family informed of their freedom to choose among providers that offer the needed level of care, that participate in Medicare, Medicaid or managed care program needed by the patient, have an available bed and are willing to accept the patient.  Yes   Patient/family informed of 's ownership interest in [facility] and [facility], as well as of the fact that they are under no obligation to receive care at these facilities.  [name] submitted to EDS on 09/14/15     [name] number received on       Existing [name] number confirmed on       FL2 transmitted to all facilities in geographic area requested by pt/family on 09/14/15     FL2 transmitted to all facilities within larger geographic area on       Patient informed that his/her managed care company has contracts with or will negotiate with certain facilities,  including the following:            Patient/family informed of bed offers received.  Patient chooses bed at       Physician recommends and patient chooses bed at      Patient to be transferred to   on  .  Patient to be transferred to facility by       Patient family notified on   of transfer.  Name of family member notified:        PHYSICIAN       Additional Comment:    _______________________________________________ Arlyss Repress, LCSW 09/14/2015, 3:41 PM

## 2015-09-14 NOTE — Consult Note (Signed)
Consultation Note Date: 09/14/2015   Patient Name: [name]  DOB: 1956/01/20  MRN: [id number]  Age / Sex: 60 y.o., male  PCP: [facility] Referring Physician: Albertine Grates, MD  Reason for Consultation:  Establishing GOC and psych-social support  HPI/Patient Profile: 60 y.o. male  Admitted on  09/07/2015 with PMH significant of chronic combined systolic and diastolic congestive heart failure, last transthoracic echocardiogram performed on 03/10/2015 that showed an EF of 30-35% with grade 3 diastolic dysfunction, history of hypertension, insulin-dependent diabetes mellitus, coronary artery disease, as well as having ongoing alcohol abuse as well as history of cocaine use.  Self reported non compliance with medications over the past one year.   He presents to the emergency room,  intoxicated, reports last drinking this morning. He came in with complaints of shortness of breath becoming progressively worse over the past week,  associated with orthopnea.   Admitted for further work-up and evaluation  Patient is faced with advanced directive decisions and anticipatory care needs.   Clinical Assessment and Goals of Care:   This NP Lorinda Creed reviewed medical records, received report from team, assessed the patient and then meet at the patient's bedside along with his sister [name] # [phone]   to discuss diagnosis,  prognosis, GOC, EOL wishes disposition and options.  Discussed the limitations of medical interventions and treatment in ES disease processes, and concept of mortality.   A detailed discussion was had today regarding advanced directives.  Concepts specific to code status, artifical feeding and hydration, continued IV antibiotics and rehospitalization was had.  The difference between a aggressive medical intervention path  and a palliative comfort care path for this patient at this  time was had.  Values and goals of care important to patient and family were attempted to be elicited.  Concept of Hospice and Palliative Care were discussed  Natural trajectory and expectations at EOL were discussed.  Questions and concerns addressed.  Hard Choices booklet left for review. Family encouraged to call with questions or concerns.  PMT will continue to support holistically.    No HPOA documented but patient tells me his sister [name] is his main support person and would act as decsion maker if necessary    SUMMARY OF RECOMMENDATIONS    - treat the treatable, patient is hopeful for improvement.  Open to all offered and available medical interventions to prolong life  - open to disposition to SNF for rehabilitation  -hopeful for substance abuse counseling asap, SW tells me he [facility]  - he does verbalize an understanding of long term poor prognosis     Code Status/Advance Care Planning:  Limited code   Symptom Management:   Medical management of chronic disease  Weakness: PT/OT as indicated  Palliative Prophylaxis:    Fall prevention, delirium prevention, pain assessment, oral care   Psycho-social/Spiritual:   Desire for further Chaplaincy support: yes  Additional Recommendations: Information of Hospice benefit  Prognosis:  Dependent on life style changes and desire for life prolonging measures  Likely less than 6 months  Discharge Planning:  SNF for rehabilitation,  Family is unable to take patient home     Primary Diagnoses: Present on Admission:  . Acute on chronic combined systolic (congestive) and diastolic (congestive) heart failure (HCC) . Hypertension . ETOH abuse . Alcohol dependence with withdrawal delirium (HCC)  I have reviewed the medical record, interviewed the patient and family, and examined the patient. The following aspects are pertinent.  Past Medical History    Diagnosis Date  . Diabetes mellitus   . Hypertension   . Coronary artery disease   . MI (myocardial infarction) (HCC) 62    age 44 per patient  . Mental disorder   . Depression    Social History   Social History  . Marital Status: Widowed    Spouse Name: N/A  . Number of Children: N/A  . Years of Education: N/A   Social History Main Topics  . Smoking status: Current Every Day Smoker -- 0.50 packs/day    Types: Cigarettes  . Smokeless tobacco: Never Used  . Alcohol Use: 1.2 oz/week    2 Cans of beer per week     Comment: drinking daily (2 40oz beer a day)  . Drug Use: 7.00 per week    Special: Cocaine, Marijuana     Comment: last use about 6 months ago  . Sexual Activity: Yes   Other Topics Concern  . None   Social History Narrative   Family History  Problem Relation Age of Onset  . Heart attack Father     >60s  . Hypertension Father   . Diabetes Father   . Heart attack Sister   . Hypertension Mother    Scheduled Meds: . antiseptic oral rinse  7 mL Mouth Rinse BID  . aspirin EC  81 mg Oral Daily  . enoxaparin (LOVENOX) injection  40 mg Subcutaneous Q24H  . folic acid  1 mg Oral Daily  . furosemide  40 mg Intravenous Daily  . guaiFENesin  600 mg Oral BID  . insulin aspart  0-15 Units Subcutaneous TID WC  . insulin aspart  0-5 Units Subcutaneous QHS  . insulin aspart  3 Units Subcutaneous TID WC  . insulin glargine  15 Units Subcutaneous QHS  . multivitamin with minerals  1 tablet Oral Daily  . pravastatin  10 mg Oral q1800  . sodium chloride flush  3 mL Intravenous Q12H  . thiamine  100 mg Oral Daily  . traZODone  100 mg Oral QHS   Continuous Infusions:  PRN Meds:.sodium chloride, acetaminophen, LORazepam, ondansetron (ZOFRAN) IV, sodium chloride, sodium chloride flush, zolpidem Medications Prior to Admission:  Prior to Admission medications   Medication Sig Start Date End Date Taking? Authorizing Provider  aspirin 81 MG tablet Take 81 mg by mouth  daily.   Yes Historical Provider, MD  buPROPion (WELLBUTRIN XL) 150 MG 24 hr tablet Take 150 mg by mouth daily.   Yes Historical Provider, MD  carvedilol (COREG) 6.25 MG tablet Take 1 tablet (6.25 mg total) by mouth 2 (two) times daily with a meal. 02/27/14  Yes Christina P Rama, MD  feeding supplement, GLUCERNA SHAKE, (GLUCERNA SHAKE) LIQD Take 237 mLs by mouth 3 (three) times daily between meals. 02/27/14  Yes Maryruth Bun Rama, MD  folic acid (FOLVITE) 1 MG tablet Take 1 tablet (1 mg total) by mouth daily. 02/27/14  Yes Maryruth Bun Rama, MD  furosemide (LASIX) 40 MG tablet Take 1 tablet (40 mg total) by mouth daily. 02/27/14  Yes Maryruth Bun Rama, MD  Insulin Glargine (LANTUS SOLOSTAR) 100 UNIT/ML Solostar Pen Inject 45 Units into the skin every morning. Patient taking differently: Inject 40 Units into the skin every morning. Holds if CBG <200 03/13/15  Yes Calvert Cantor, MD  lisinopril (PRINIVIL,ZESTRIL) 20 MG tablet Take 20 mg by mouth daily.   Yes Historical Provider, MD  metFORMIN (GLUCOPHAGE) 1000 MG tablet Take 0.5 tablets (500 mg total) by mouth 2 (two) times daily with a meal. For high blood sugar control Patient taking differently: Take 1,000 mg by mouth 2 (two) times daily with a meal. For high blood sugar control 12/27/12  Yes Sanjuana Kava, NP  Multiple Vitamin (MULTIVITAMIN WITH MINERALS) TABS tablet Take 1 tablet by mouth daily. 02/27/14  Yes Maryruth Bun Rama, MD  potassium chloride 20 MEQ TBCR Take 20 mEq by mouth daily. For low potassium supplement 02/27/14  Yes Christina P Rama, MD  pravastatin (PRAVACHOL) 10 MG tablet Take 1 tablet (10 mg total) by mouth daily. For high cholesterol control 12/27/12  Yes Sanjuana Kava, NP  sertraline (ZOLOFT) 100 MG tablet Take 1 tablet (100 mg total) by mouth at bedtime. For depression 12/27/12  Yes Sanjuana Kava, NP  traZODone (DESYREL) 100 MG tablet Take 1 tablet (100 mg total) by mouth at bedtime. For sleep 12/27/12  Yes Sanjuana Kava, NP  insulin aspart  (NOVOLOG FLEXPEN) 100 UNIT/ML FlexPen Inject 12 Units into the skin 3 (three) times daily with meals. Patient not taking: Reported on 09/08/2015 03/12/15   Calvert Cantor, MD  thiamine 100 MG tablet Take 1 tablet (100 mg total) by mouth daily. Patient not taking: Reported on 09/08/2015 02/27/14   Maryruth Bun Rama, MD   No Known Allergies Review of Systems  Constitutional: Positive for fatigue.  Respiratory: Positive for shortness of breath.     Physical Exam  Vital Signs: BP 139/88 mmHg  Pulse 85  Temp(Src) 97.3 F (36.3 C) (Oral)  Resp 20  Ht  (1.88 m)  Wt 72.485 kg (159 lb 12.8 oz)  BMI 20.51 kg/m2  SpO2 97% Pain Assessment: No/denies pain   Pain Score: 0-No pain   SpO2: SpO2: 97 % O2 Device:SpO2: 97 % O2 Flow Rate: .O2 Flow Rate (L/min): 2 L/min  IO: Intake/output summary:  Intake/Output Summary (Last 24 hours) at 09/14/15 1519 Last data filed at 09/14/15 1400  Gross per 24 hour  Intake    960 ml  Output   1150 ml  Net   -190 ml    LBM: Last BM Date: 09/13/15 Baseline Weight: Weight: 71.668 kg (158 lb) Most recent weight: Weight: 72.485 kg (159 lb 12.8 oz)      Palliative Assessment/Data: 40 %   Discussed with Dr Roda Shutters  Time In: 1400 Time Out: 1530 Time Total: 90 min Greater than 50%  of this time was spent counseling and coordinating care related to the above assessment and plan.  Signed by: Lorinda Creed, NP   Please contact Palliative Medicine Team phone at [phone] for questions and concerns.  For individual provider: See Loretha Stapler

## 2015-09-14 NOTE — [place] FL2 (Signed)
[place] MEDICAID FL2 LEVEL OF CARE SCREENING TOOL     IDENTIFICATION  Patient Name: [name] Birthdate: 1955/10/03 Sex: male Admission Date (Current Location): 09/07/2015  [facility] and [place] Number:  Producer, television/film/video and Address:  [facility],  [address] [place] [address]      Provider Number: [id number]  Attending Physician Name and Address:  Albertine Grates, MD  Relative Name and Phone Number:       Current Level of Care: Hospital Recommended Level of Care: Skilled Nursing Facility Prior Approval Number:    Date Approved/Denied:   [name] Number:    Discharge Plan: Home    Current Diagnoses: Patient Active Problem List   Diagnosis Date Noted  . Insulin dependent diabetes mellitus (HCC)   . Hepatitis, viral   . Pleural effusion on right   . CHF (congestive heart failure) (HCC) 09/07/2015  . Acute on chronic combined systolic (congestive) and diastolic (congestive) heart failure (HCC) 09/07/2015  . Elevated troponin I level 03/10/2015  . Alcohol withdrawal (HCC)   . Type 2 diabetes mellitus with complication (HCC)   . 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 Acute respiratory failure with hypoxia (HCC)   . Alcohol abuse with intoxication (HCC)   . Essential hypertension   . Acute on chronic systolic CHF (congestive heart failure) (HCC)   . 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 Alcohol dependence with withdrawal delirium (HCC) 02/23/2014  . Hyponatremia 02/23/2014  . Transaminitis 02/22/2014  . Protein-calorie malnutrition (HCC) 02/22/2014  . Other pancytopenia (HCC) 02/22/2014  . Hypokalemia 02/22/2014  . Hypomagnesemia 02/22/2014  . Alcoholic ketoacidosis 02/20/2014  . Acute respiratory failure (HCC) 02/20/2014  . Systolic and diastolic CHF, acute on chronic (HCC) 02/20/2014  . ETOH abuse 12/21/2012  . Cocaine dependence (HCC) 12/21/2012  . Substance induced mood disorder (HCC) 12/21/2012  . Non Obstructive CAD  (coronary artery disease) 08/14/2012  . Cardiomyopathy, nonischemic (HCC) 08/14/2012  . Systolic and diastolic CHF, acute (HCC) 08/14/2012  . Diabetes mellitus, type II (HCC) 08/11/2012  . Hypertension 08/11/2012  . History of substance abuse 08/11/2012    Orientation RESPIRATION BLADDER Height & Weight     Self, Time, Situation, Place  Normal Continent Weight: 159 lb 12.8 oz (72.485 kg) Height:   (188 cm)  BEHAVIORAL SYMPTOMS/MOOD NEUROLOGICAL BOWEL NUTRITION STATUS      Incontinent Diet (Carb Modified/Heart Healthy)  AMBULATORY STATUS COMMUNICATION OF NEEDS Skin   Extensive Assist Verbally Normal                       Personal Care Assistance Level of Assistance  Bathing, Dressing Bathing Assistance: Limited assistance   Dressing Assistance: Limited assistance     Functional Limitations Info             SPECIAL CARE FACTORS FREQUENCY  PT (By licensed PT), OT (By licensed OT)     PT Frequency: 5 OT Frequency: 5            Contractures      Additional Factors Info  Code Status, Allergies Code Status Info: Partial Allergies Info: NKDA           Current Medications (09/14/2015):  This is the current hospital active medication list Current Facility-Administered Medications  Medication Dose Route Frequency Provider Last Rate Last Dose  . 0.9 %  sodium chloride infusion  250 mL Intravenous PRN Jeralyn Bennett, MD      . acetaminophen (TYLENOL) tablet 650  mg  650 mg Oral Q6H PRN Albertine Grates, MD      . antiseptic oral rinse (CPC / CETYLPYRIDINIUM CHLORIDE 0.05%) solution 7 mL  7 mL Mouth Rinse BID Jeralyn Bennett, MD   7 mL at 09/14/15 0854  . aspirin EC tablet 81 mg  81 mg Oral Daily Jeralyn Bennett, MD   81 mg at 09/14/15 0853  . enoxaparin (LOVENOX) injection 40 mg  40 mg Subcutaneous Q24H Jeralyn Bennett, MD   40 mg at 09/13/15 2104  . folic acid (FOLVITE) tablet 1 mg  1 mg Oral Daily Jeralyn Bennett, MD   1 mg at 09/14/15 0853  . furosemide (LASIX)  injection 40 mg  40 mg Intravenous Daily Chrystie Nose, MD   40 mg at 09/14/15 1237  . guaiFENesin (MUCINEX) 12 hr tablet 600 mg  600 mg Oral BID Albertine Grates, MD   600 mg at 09/14/15 0854  . insulin aspart (novoLOG) injection 0-15 Units  0-15 Units Subcutaneous TID WC Jeralyn Bennett, MD   8 Units at 09/14/15 1237  . insulin aspart (novoLOG) injection 0-5 Units  0-5 Units Subcutaneous QHS Jeralyn Bennett, MD   4 Units at 09/13/15 2217  . insulin aspart (novoLOG) injection 3 Units  3 Units Subcutaneous TID WC Albertine Grates, MD   3 Units at 09/14/15 1238  . insulin glargine (LANTUS) injection 15 Units  15 Units Subcutaneous QHS Albertine Grates, MD      . LORazepam (ATIVAN) injection 0.5 mg  0.5 mg Intravenous Q6H PRN Albertine Grates, MD   0.5 mg at 09/14/15 0536  . multivitamin with minerals tablet 1 tablet  1 tablet Oral Daily Jeralyn Bennett, MD   1 tablet at 09/14/15 0853  . ondansetron (ZOFRAN) injection 4 mg  4 mg Intravenous Q6H PRN Jeralyn Bennett, MD      . pravastatin (PRAVACHOL) tablet 10 mg  10 mg Oral q1800 Jeralyn Bennett, MD   10 mg at 09/13/15 1740  . sodium chloride 0.9 % bolus 250 mL  250 mL Intravenous Once PRN Leda Gauze, NP      . sodium chloride flush (NS) 0.9 % injection 3 mL  3 mL Intravenous Q12H Jeralyn Bennett, MD   3 mL at 09/14/15 0854  . sodium chloride flush (NS) 0.9 % injection 3 mL  3 mL Intravenous PRN Jeralyn Bennett, MD      . thiamine (VITAMIN B-1) tablet 100 mg  100 mg Oral Daily Jeralyn Bennett, MD   100 mg at 09/14/15 0854  . traZODone (DESYREL) tablet 100 mg  100 mg Oral QHS Jeralyn Bennett, MD   100 mg at 09/13/15 2103  . zolpidem (AMBIEN) tablet 5 mg  5 mg Oral Once PRN Leda Gauze, NP         Discharge Medications: Please see discharge summary for a list of discharge medications.  Relevant Imaging Results:  Relevant Lab Results:   Additional Information SSN: [id number]  Arlyss Repress, LCSW

## 2015-09-14 NOTE — Progress Notes (Signed)
Subjective: He reports orthopnea and PND  Objective: Vital signs in last 24 hours: Temp:  [97.8 F (36.6 C)-98.4 F (36.9 C)] 97.8 F (36.6 C) (05/22 0549) Pulse Rate:  [65-84] 76 (05/22 0549) Resp:  [20] 20 (05/22 0549) BP: (89-122)/(49-77) 112/69 mmHg (05/22 0549) SpO2:  [96 %-98 %] 98 % (05/22 0549) Weight:  [159 lb 12.8 oz (72.485 kg)] 159 lb 12.8 oz (72.485 kg) (05/22 0549) Last BM Date: 09/12/15  Intake/Output from previous day: 05/21 0701 - 05/22 0700 In: 600 [P.O.:600] Out: 800 [Urine:800] Intake/Output this shift:    Medications Scheduled Meds: . antiseptic oral rinse  7 mL Mouth Rinse BID  . aspirin EC  81 mg Oral Daily  . enoxaparin (LOVENOX) injection  40 mg Subcutaneous Q24H  . folic acid  1 mg Oral Daily  . guaiFENesin  600 mg Oral BID  . insulin aspart  0-15 Units Subcutaneous TID WC  . insulin aspart  0-5 Units Subcutaneous QHS  . insulin aspart  3 Units Subcutaneous TID WC  . insulin glargine  10 Units Subcutaneous QHS  . multivitamin with minerals  1 tablet Oral Daily  . pravastatin  10 mg Oral q1800  . sodium chloride flush  3 mL Intravenous Q12H  . thiamine  100 mg Oral Daily  . traZODone  100 mg Oral QHS   Continuous Infusions:  PRN Meds:.sodium chloride, acetaminophen, LORazepam, ondansetron (ZOFRAN) IV, sodium chloride, sodium chloride flush, zolpidem  PE: General appearance: alert, cooperative and no distress Neck: no JVD Lungs: Decreased BS bilateral bases.  Heart: regular rate and rhythm, S1, S2 normal, no murmur, click, rub or gallop Abdomen: +BS, nontender, no distension Extremities: No LEE Pulses: 2+ and symmetric Skin: Warm and dry Neurologic: Grossly normal  Lab Results:   Recent Labs  09/14/15 0518  WBC 4.8  HGB 13.4  HCT 39.4  PLT 216   BMET  Recent Labs  09/12/15 0516 09/13/15 0538 09/14/15 0518  NA 132* 133* 134*  K 4.1 4.1 4.4  CL 100* 103 105  CO2 24 24 23   GLUCOSE 271* 216* 251*  BUN 19 17 19     CREATININE 1.15 1.20 1.12  CALCIUM 8.0* 8.1* 8.1*     Assessment/Plan  Principal Problem:   Acute on chronic combined systolic (congestive) and diastolic (congestive) heart failure (HCC) Active Problems:   Diabetes mellitus, type II (HCC)   Hypertension   Cardiomyopathy, nonischemic (HCC)   ETOH abuse   Alcohol dependence with withdrawal delirium (HCC)   CHF (congestive heart failure) (HCC)   Pleural effusion on right   Insulin dependent diabetes mellitus (HCC)   Hepatitis, viral  [name] is a 60 y.o. male with a history of NICM/chronic combined S/D CHF (EF 30-35%/G3DD), HTN, IDDM, CAD and ongoing alcohol and cocaine abuse who presented to [facility] on 09/07/15 with SOB while intoxicated.   Acute on chronic combined S/D CHF:  reapeat 2D ECHO with mildly worsening EF to 25-30%, mod MR, mild LAE, mild-mod RV dysfunction, PA pressure 40, trivial pericardial effusion with left pleural effusion.  -- BNP of 1076(5/15), 753(5/20), 862-today,  chest x-ray 5/21: Increasing size of right pleural effusion post thoracentesis, small to moderate in size. Unchanged small to moderate left pleural effusion. Associated compressive atelectasis. 2. Increased interstitial opacities suspicious for mild pulmonary edema. 3. Overall findings suspicious for worsening CHF.  S/P thoracentesis 5/19 with 1.8 Liters removed.  --Net fluids: -0.2L/-5.4L  -- he received PO lasix 20mg  daily Sat and Sun  then [facility] --Still with orthopnea/PND.  Resume PO lasix.  Give 20 now. Follow BP and SCr.    -- Coreg 3.125mg  BID and lisinopril discontinued due to AKI(resolved) and soft BPs.  Not overtly volume overloaded on exam but decreased BS from reaccumulating pleural eff.. Creat is stable with diuresis.  BP will not support adding back at this time.   Repeat thoracentesis?   Elevated troponin: 0.08--> 0.06. Low and flat c/w demand ischemia. No plans for cath   HTN: BPs soft. Lisinopril and imdur stopped   Type 2  diabetes mellitus: metformin held, HgA1c 11.7  History of coronary artery disease: non obst on cath in 2014.   Polysubstance abuse: UDS + cocaine, benzos and was intoxicated on alcohol on admission. On CIWA protocol. Long term prognosis very poor if he cannot quit drinking and using drugs.   Non compliance: does not follow up as an outpatient and continues to abuse drugs and alcohol   Disposition unclear that he has a place to go home     LOS: 7 days    Wilburt Finlay PA-C 09/14/2015 8:32 AM

## 2015-09-14 NOTE — Progress Notes (Signed)
Resumed care of patient. Agree with previous assessment. Orders reviewed. Will continue to monitor. 

## 2015-09-14 NOTE — Progress Notes (Signed)
Physical Therapy Treatment Patient Details Name: [name] MRN: [id number] DOB: 26-Nov-1955 Today's Date: 09/14/2015    History of Present Illness 60 yo male admitted with heart failure. Hx of ETOH abuse, DM, HTN, HF, CAD, drug use, MI, mental d/o, CHF    PT Comments    Progressing with mobility. Pt is unsteady at times-still at risk for falls. Pt declined walker use during session. Recommend daily ambulation in hallway with nursing supervision/assist.   Follow Up Recommendations  Home health PT (depending on progress and if pt is agreeable);Supervision/Assistance - 24 hour. (pt refusing SNF placement)     Equipment Recommendations  None recommended by PT (pt refusing use of walker)    Recommendations for Other Services       Precautions / Restrictions Precautions Precautions: Fall Restrictions Weight Bearing Restrictions: No    Mobility  Bed Mobility Overal bed mobility: Modified Independent                Transfers Overall transfer level: Needs assistance Equipment used: None Transfers: Sit to/from Stand Sit to Stand: Supervision         General transfer comment: close supervision for safety. Pt is unsteady at times  Ambulation/Gait Ambulation/Gait assistance: Min assist Ambulation Distance (Feet): 275 Feet Assistive device: None Gait Pattern/deviations: Step-through pattern;Staggering right;Staggering left;Drifts right/left     General Gait Details: intermittent assist to stabilize. Dyspnea 2/4 with ambulation. Staggering initially but this improved as distance increased.   Stairs            Wheelchair Mobility    Modified Rankin (Stroke Patients Only)       Balance Overall balance assessment: Needs assistance         Standing balance support: During functional activity Standing balance-Leahy Scale: Fair                      Cognition Arousal/Alertness: Awake/alert Behavior During Therapy: WFL for tasks  assessed/performed Overall Cognitive Status: Within Functional Limits for tasks assessed                      Exercises      General Comments        Pertinent Vitals/Pain Pain Assessment: No/denies pain    Home Living                      Prior Function            PT Goals (current goals can now be found in the care plan section) Progress towards PT goals: Progressing toward goals    Frequency  Min 3X/week    PT Plan Current plan remains appropriate    Co-evaluation             End of Session Equipment Utilized During Treatment: Gait belt Activity Tolerance: Patient tolerated treatment well Patient left: in bed;with call bell/phone within reach;with bed alarm set;with family/visitor present     Time: 7867-6720 PT Time Calculation (min) (ACUTE ONLY): 13 min  Charges:  $Gait Training: 8-22 mins                    G Codes:      Rebeca Alert, MPT Pager: [phone]

## 2015-09-14 NOTE — Care Management Note (Signed)
Case Management Note  Patient Details  Name: [name] MRN: [id number] Date of Birth: 02-Jul-1955  Subjective/Objective:  Palliative following. CSW faxing out to SNF/medical issues/detox-?[facility].                 Action/Plan:d/c SNF.   Expected Discharge Date:   (UNKNOWN)               Expected Discharge Plan:  Skilled Nursing Facility  In-House Referral:  Clinical Social Work  Discharge planning Services  CM Consult  Post Acute Care Choice:    Choice offered to:     DME Arranged:    DME Agency:     HH Arranged:    HH Agency:     Status of Service:  In process, will continue to follow  Medicare Important Message Given:  Yes Date Medicare IM Given:    Medicare IM give by:    Date Additional Medicare IM Given:    Additional Medicare Important Message give by:     If discussed at Long Length of Stay Meetings, dates discussed:    Additional Comments:  Lanier Clam, RN 09/14/2015, 3:52 PM

## 2015-09-15 DIAGNOSIS — Z7189 Other specified counseling: Secondary | ICD-10-CM

## 2015-09-15 DIAGNOSIS — J90 Pleural effusion, not elsewhere classified: Secondary | ICD-10-CM

## 2015-09-15 DIAGNOSIS — F1910 Other psychoactive substance abuse, uncomplicated: Secondary | ICD-10-CM | POA: Insufficient documentation

## 2015-09-15 DIAGNOSIS — I10 Essential (primary) hypertension: Secondary | ICD-10-CM

## 2015-09-15 DIAGNOSIS — Z515 Encounter for palliative care: Secondary | ICD-10-CM | POA: Insufficient documentation

## 2015-09-15 LAB — GLUCOSE, CAPILLARY
GLUCOSE-CAPILLARY: 249 mg/dL — AB (ref 65–99)
GLUCOSE-CAPILLARY: 212 mg/dL — AB (ref 65–99)
Glucose-Capillary: 143 mg/dL — ABNORMAL HIGH (ref 65–99)
Glucose-Capillary: 262 mg/dL — ABNORMAL HIGH (ref 65–99)

## 2015-09-15 MED ORDER — INSULIN ASPART 100 UNIT/ML ~~LOC~~ SOLN: 3.0000 [IU] | Freq: Three times a day (TID) | SUBCUTANEOUS | Status: DC

## 2015-09-15 MED ORDER — MAGNESIUM OXIDE 400 (241.3 MG) MG PO TABS
400.0000 mg | ORAL_TABLET | Freq: Every day | ORAL | Status: AC
  Administered 2015-09-15 – 2015-09-16 (×2): 400 mg via ORAL
  Filled 2015-09-15 (×2): qty 1

## 2015-09-15 MED ORDER — CARVEDILOL 3.125 MG PO TABS: 3.1250 mg | ORAL_TABLET | Freq: Two times a day (BID) | ORAL | Status: DC

## 2015-09-15 MED ORDER — LISINOPRIL 10 MG PO TABS
5.0000 mg | ORAL_TABLET | Freq: Every day | ORAL | Status: DC
  Administered 2015-09-15 – 2015-09-16 (×2): 5 mg via ORAL
  Filled 2015-09-15 (×2): qty 1

## 2015-09-15 MED ORDER — LISINOPRIL 5 MG PO TABS: 5.0000 mg | ORAL_TABLET | Freq: Every day | ORAL | Status: DC

## 2015-09-15 MED ORDER — GUAIFENESIN ER 600 MG PO TB12: 600.0000 mg | ORAL_TABLET | Freq: Two times a day (BID) | ORAL | Status: DC

## 2015-09-15 MED ORDER — SENNOSIDES-DOCUSATE SODIUM 8.6-50 MG PO TABS: 1.0000 | ORAL_TABLET | Freq: Every evening | ORAL | Status: DC | PRN

## 2015-09-15 MED ORDER — HYDROCODONE-ACETAMINOPHEN 5-325 MG PO TABS
1.0000 | ORAL_TABLET | Freq: Once | ORAL | Status: AC
  Administered 2015-09-15: 1 via ORAL
  Filled 2015-09-15: qty 1

## 2015-09-15 MED ORDER — POTASSIUM CHLORIDE ER 20 MEQ PO TBCR: 20.0000 meq | EXTENDED_RELEASE_TABLET | ORAL | Status: DC

## 2015-09-15 MED ORDER — LORAZEPAM 2 MG/ML IJ SOLN
0.5000 mg | Freq: Once | INTRAMUSCULAR | Status: AC
  Administered 2015-09-15: 0.5 mg via INTRAVENOUS
  Filled 2015-09-15: qty 1

## 2015-09-15 MED ORDER — INSULIN GLARGINE 100 UNIT/ML SOLOSTAR PEN: 20.0000 [IU] | PEN_INJECTOR | Freq: Every morning | SUBCUTANEOUS | Status: DC

## 2015-09-15 MED ORDER — INSULIN ASPART 100 UNIT/ML ~~LOC~~ SOLN: 0.0000 [IU] | Freq: Three times a day (TID) | SUBCUTANEOUS | Status: DC

## 2015-09-15 MED ORDER — MAGNESIUM OXIDE 400 (241.3 MG) MG PO TABS: 400.0000 mg | ORAL_TABLET | Freq: Every day | ORAL | Status: DC

## 2015-09-15 MED ORDER — SENNOSIDES-DOCUSATE SODIUM 8.6-50 MG PO TABS
1.0000 | ORAL_TABLET | Freq: Two times a day (BID) | ORAL | Status: DC
  Administered 2015-09-15 – 2015-09-16 (×3): 1 via ORAL
  Filled 2015-09-15 (×3): qty 1

## 2015-09-15 NOTE — Discharge Summary (Addendum)
Discharge Summary  [name] ZOX:[id number] DOB: 09/27/1955  PCP: [facility]  Admit date: 09/07/2015 Discharge date: 2  Time spent: >75mins  Recommendations for Outpatient Follow-up:  1. F/u with PMD at snf within a week  for hospital discharge follow up, repeat cbc/bmp at follow up 2. F/u with cardiology for chf  Discharge Diagnoses:  Active Hospital Problems   Diagnosis Date Noted  . Acute on chronic combined systolic (congestive) and diastolic (congestive) heart failure (HCC) 09/07/2015    Priority: High  . Cough   . DNR (do not resuscitate) discussion   . Palliative care encounter   . Polysubstance abuse   . Pleural effusion   . Insulin dependent diabetes mellitus (HCC)   . Hepatitis, viral   . Pleural effusion on right   . CHF (congestive heart failure) (HCC) 09/07/2015  . Alcohol dependence with withdrawal delirium (HCC) 02/23/2014  . ETOH abuse 12/21/2012  . Cardiomyopathy, nonischemic (HCC) 08/14/2012  . Diabetes mellitus, type II (HCC) 08/11/2012  . Hypertension 08/11/2012    Resolved Hospital Problems   Diagnosis Date Noted Date Resolved  No resolved problems to display.    Discharge Condition: stable  Diet recommendation: heart healthy/carb modified  Filed Weights   09/14/15 0549 09/15/15 0635 09/16/15 0447  Weight: 72.485 kg (159 lb 12.8 oz) 74.571 kg (164 lb 6.4 oz) 74.1 kg (163 lb 5.8 oz)    History of present illness:  Chief Complaint: Shortness of breath  HPI: [name] is a 60 y.o. male with medical history significant of chronic combined systolic and diastolic congestive heart failure, last transthoracic echocardiogram performed on 03/10/2015 that showed an EF of 30-35% with grade 3 diastolic dysfunction, history of hypertension, insulin-dependent diabetes mellitus, coronary artery disease, as well as having ongoing alcohol abuse as well as history of cocaine use. He presents to the emergency room, is intoxicated, reports  last drinking this morning. He came in with complaints of shortness of breath becoming progressively worse over the past week. This is associated with orthopnea. Denies fevers, chills, cough, sputum production. He states drinking both beer and liquor and "anything I can get my hands on." Poor historian, appears intoxicated. He tells me that he has not been taking his medications recently.  ED Course: In the emergency department had a BNP of 1076, chest x-ray showing evidence of pulmonary edema. He was given 60 mg of IV Lasix.  Patient admitted with acute on chronic combine Heart failure exacerbation, he was treated for the same with IV lasix. He was started on CIWA due to high risk for alcohol withdrawal.    Hospital Course:  Principal Problem:   Acute on chronic combined systolic (congestive) and diastolic (congestive) heart failure (HCC) Active Problems:   Diabetes mellitus, type II (HCC)   Hypertension   Cardiomyopathy, nonischemic (HCC)   ETOH abuse   Alcohol dependence with withdrawal delirium (HCC)   CHF (congestive heart failure) (HCC)   Pleural effusion on right   Insulin dependent diabetes mellitus (HCC)   Hepatitis, viral   DNR (do not resuscitate) discussion   Palliative care encounter   Polysubstance abuse   Pleural effusion   Cough  Acute on chronic combined systolic and diastolic congestive heart failure Last ECHO ; 03/10/2015 that showed an EF of 30-35% with grade 3 diastolic dysfunction Repeat echo with reduced EF at 25-35%, lisinopril, imdur held initially due to hypotension and cr elevated, restarted low dose lisinopril at discharge,  coreg dose reduced 3.125 bid,  s/p IV Lasix 40 mg twice daily since admission, lasix d/ced on 5/17 due to elevation of cr, low dose oral lasix restarted on 5/18, cr stable so far on low dose lasix Developed hypotension on 5/21, coreg and lasix held, 500c ns bolus given on 5/21 bp better on 5/22, cardiology restart lasix,  repeat cxr  with re accumulation of pleural effusion, cardiology recommended palliative care consult for goal of care, palliative care consulted, input appreciated, please refer to palliative care note on 5/22 for detail.  On 09/16/2015 patient remained stable. His Coreg was increased to 6.25 mg by mouth twice a day by cardiology. Remains on lisinopril 5 mg by mouth daily. Last blood pressure 110/82.  He was discharged on Lasix 40 mg by mouth daily. Please follow-up on volume status.  Pleural effusion: ct chest + moderate pleural effusion bilaterally with extensive lower lobe airspace consolidation, + atherosclerotic calcification.  s/p thoracentesis on 5/19 due to persistent sob, with 1.8 liter of pleural fluids drained. Pleural fluids lymphocyte predominent, total protein less than 3, ldh 45, gram stain no organism, likely transudate Currently no leukocytosis, no fever, sputum culture no growth Lasix held on 5/21 due to hypotension, sbp in 80's. Restart lasix on 5/22, repeat cxr with re accumulation of pleural effusion Continue lasix, may need to repeat thoracentesis in a few days, close cardiology follow up.  NSVT: 6 beats on tele on 5/20 and 5/22, iv mag given, continue coreg. Keep k >4, mag>2. Discharged on oral mag daily  Cr elevation: from overdiuresing? Lasix held on 5/17, resumed on 5/18, lasix titration per cardiology, cr stable at 1.22 at discharge  Type 2 diabetes mellitus, insulin-dependent, a1c 11.7, Patient admitted that he has been noncompliant with meds and diet, -He was discharged on Lantus 20 units subcutaneous daily -Please follow-up on blood sugars.  Hypertension:  -He was discharged on Coreg 6.25 mg by mouth twice a day and lisinopril 5 mg by mouth daily  History of coronary artery disease. Currently denies chest pain, troponin negative at 0.08 in the emergency department. Place him on continuous cardiac monitoring. Last cath in 2014 moderate nonobstructive, nonischemic  cardiomyopathy, patient has not been following up with cardiology   Depression; unclear if he has been taking Zoloft, Wellbutrin, hold to avoid sedation.  He is on trazodone qhs  Chronic alcohol abuse; reported history of seizure from alcohol withdrawal, at risk for DT. on CIWA protocol. No overt withdrawal problem.   Substance abuse;  reported continued drink heavily and use drug, urine tox screen +cocaine ,  Patient reported substance abuse due to stress at home, advise patent to seem family counseling by pmd at [place]. 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 hcv rna significantly elevated at [id number]. [facility]. He is advised to stop alcohol and illicit drug use Patient report [facility] plan [facility]pc in the near future.   Code Status: partial code  Questions for Current Code Status    Question Answer Comment   In the event of cardiac or respiratory ARREST: Initiate Code Blue, Call Rapid Response Yes    In the event of cardiac or respiratory ARREST: Perform CPR No    In the event of cardiac or respiratory ARREST: Perform Intubation/Mechanical Ventilation No    In the event of cardiac or respiratory  ARREST: Use NIPPV/BiPAp only if indicated No    In the event of cardiac or respiratory ARREST: Administer ACLS medications if indicated Yes    In the event of cardiac or respiratory ARREST: Perform Defibrillation or Cardioversion if indicated No      Family Communication: care discussed with patient and his fiance in room Disposition Plan: SNF  Consultants:   Cardiology  Palliative care, detail please see palliative care note on 5/22  Procedures:   US guided thoracentesis with 1.8 liter removed  Antimicrobials:   none  Discharge Exam: BP 107/68 mmHg  Pulse 69  Temp(Src) 98  F (36.7 C) (Oral)  Resp 18  Ht 6\' 2"  (1.88 m)  Wt 74.1 kg (163 lb 5.8 oz)  BMI 20.97 kg/m2  SpO2 97%  General exam: weak, on room air, not in respiratory distress Respiratory system: Respiratory effort normal. Bilateral crackles has largely resolved, diminished at basis, No wheezing, no rhonchi,  Cardiovascular system: S1 & S2 heard, RRR. No pedal edema. Gastrointestinal system: Abdomen is nondistended, soft and nontender. No organomegaly or masses felt. Normal bowel sounds heard. Central nervous system: aaox3, no focal weakness, no asterixis Extremities: Symmetric 5 x 5 power. Skin: No rashes, lesions or ulcers   Discharge Instructions You were cared for by a hospitalist during your hospital stay. If you have any questions about your discharge medications or the care you received while you were in the hospital after you are discharged, you can call the unit and asked to speak with the hospitalist on call if the hospitalist that took care of you is not available. Once you are discharged, your primary care physician will handle any further medical issues. Please note that NO REFILLS for any discharge medications will be authorized once you are discharged, as it is imperative that you return to your primary care physician (or establish a relationship with a primary care physician if you do not have one) for your aftercare needs so that they can reassess your need for medications and monitor your lab values.      Discharge Instructions    Call MD for:  difficulty breathing, headache or visual disturbances    Complete by:  As directed      Call MD for:  extreme fatigue    Complete by:  As directed      Call MD for:  hives    Complete by:  As directed      Call MD for:  persistant dizziness or light-headedness    Complete by:  As directed      Call MD for:  persistant nausea and vomiting    Complete by:  As directed      Call MD for:  redness, tenderness, or signs of infection (pain,  swelling, redness, odor or green/yellow discharge around incision site)    Complete by:  As directed      Call MD for:  severe uncontrolled pain    Complete by:  As directed      Call MD for:  temperature >100.4    Complete by:  As directed      Call MD for:    Complete by:  As directed      Diet - low sodium heart healthy    Complete by:  As directed   Carb modified     Diet - low sodium heart healthy    Complete by:  As directed      Increase activity slowly    Complete by:  As directed      Increase activity slowly    Complete by:  As directed             Medication List    STOP taking these medications        buPROPion 150 MG 24 hr tablet  Commonly known as:  WELLBUTRIN XL     insulin aspart 100 UNIT/ML FlexPen  Commonly known as:  NOVOLOG FLEXPEN  Replaced by:  insulin aspart 100 UNIT/ML injection     sertraline 100 MG tablet  Commonly known as:  ZOLOFT      TAKE these medications        aspirin 81 MG tablet  Take 81 mg by mouth daily.     carvedilol 6.25 MG tablet  Commonly known as:  COREG  Take 1 tablet (6.25 mg total) by mouth 2 (two) times daily with a meal.     feeding supplement (GLUCERNA SHAKE) Liqd  Take 237 mLs by mouth 3 (three) times daily between meals.     folic acid 1 MG tablet  Commonly known as:  FOLVITE  Take 1 tablet (1 mg total) by mouth daily.     furosemide 40 MG tablet  Commonly known as:  LASIX  Take 1 tablet (40 mg total) by mouth daily.  Start taking on:  09/17/2015     guaiFENesin 600 MG 12 hr tablet  Commonly known as:  MUCINEX  Take 1 tablet (600 mg total) by mouth 2 (two) times daily.     insulin aspart 100 UNIT/ML injection  Commonly known as:  novoLOG  Inject 3 Units into the skin 3 (three) times daily with meals.     insulin aspart 100 UNIT/ML injection  Commonly known as:  novoLOG  Inject 0-15 Units into the skin 3 (three) times daily with meals. Before each meal 3 times a day, 140-199 - 2 units, 200-250 - 4  units, 251-299 - 6 units,  300-349 - 8 units,  350 or above 10 units. Insulin PEN if approved, provide syringes and needles if needed.     Insulin Glargine 100 UNIT/ML Solostar Pen  Commonly known as:  LANTUS SOLOSTAR  Inject 20 Units into the skin every morning.     lisinopril 5 MG tablet  Commonly known as:  PRINIVIL,ZESTRIL  Take 1 tablet (5 mg total) by mouth daily.     magnesium oxide 400 (241.3 Mg) MG tablet  Commonly known as:  MAG-OX  Take 1 tablet (400 mg total) by mouth daily.     metFORMIN 1000 MG tablet  Commonly known as:  GLUCOPHAGE  Take 0.5 tablets (500 mg total) by mouth 2 (two) times daily with a meal. For high blood sugar control     multivitamin with minerals Tabs tablet  Take 1 tablet by mouth daily.     Potassium Chloride ER 20 MEQ Tbcr  Take 20 mEq by mouth every Monday, Wednesday, and Friday. For low potassium supplement     pravastatin 10 MG tablet  Commonly known as:  PRAVACHOL  Take 1 tablet (10 mg total) by mouth daily. For high cholesterol control     senna-docusate 8.6-50 MG tablet  Commonly known as:  Senokot-S  Take 1 tablet by mouth at bedtime as needed for mild constipation.     thiamine 100 MG tablet  Take 1 tablet (100 mg total) by mouth daily.     traZODone 100 MG tablet  Commonly known as:  DESYREL  Take 1 tablet (  100 mg total) by mouth at bedtime. For sleep       No Known Allergies Follow-up Information    Follow up with [facility] In 1 week.   Specialty:  Cardiology   Why:  hospital discharge follow up for chf   Contact information:   [address], [address] [place] [address] [phone]      Follow up with [facility].   Specialty:  Skilled Nursing Facility   Contact information:   [address] [place] [address] [phone]      Follow up with [facility] In 1 week.   Specialty:  General Practice   Why:  hospital discharge follow up    Contact information:   [address] [place] [address] [phone]        The results of significant diagnostics from this hospitalization (including imaging, microbiology, ancillary and laboratory) are listed below for reference.    Significant Diagnostic Studies: Dg Chest 1 View  09/11/2015  CLINICAL DATA:  Status post right thoracentesis EXAM: CHEST 1 VIEW COMPARISON:  09/07/2015 chest radiographs and 09/11/2015 left lateral decubitus chest radiograph. FINDINGS: Stable cardiomediastinal silhouette with top-normal heart size. No pneumothorax. Small residual right pleural effusion, decreased. Small left pleural effusion, stable. No overt pulmonary edema. Left greater than right bibasilar patchy lung opacities, stable on the left and decreased on the right. IMPRESSION: 1. No pneumothorax. 2. Small residual right pleural effusion, decreased. Mild right lung base opacity, decreased, favor atelectasis. 3. Stable small left pleural effusion and left basilar lung opacity, favor atelectasis. Electronically Signed   By: Delbert Phenix M.D.   On: 09/11/2015 14:10   Dg Chest 2 View  09/14/2015  CLINICAL DATA:  Cough for 2 days. Patient admitted with heart failure and shortness of breath. Thoracentesis 2 days prior. EXAM: CHEST  2 VIEW COMPARISON:  Radiograph 09/11/2015 FINDINGS: Bilateral pleural effusions, small to moderate moderate in size, increased on the right from prior exam. Left pleural effusion is unchanged. There are associated bibasilar opacities, favor to be compressive atelectasis. Increasing interstitial prominence, likely minimal pulmonary edema. Cardiomediastinal contours are unchanged. No pneumothorax. IMPRESSION: 1. Increasing size of right pleural effusion post thoracentesis, small to moderate in size. Unchanged small to moderate left pleural effusion. Associated compressive atelectasis. 2. Increased interstitial opacities suspicious for mild pulmonary edema. 3. Overall findings  suspicious for worsening CHF. Electronically Signed   By: Rubye Oaks M.D.   On: 09/14/2015 01:33   Dg Chest 2 View  09/07/2015  CLINICAL DATA:  Increased shortness of breath, weakness and lethargy. History of smoking. EXAM: CHEST  2 VIEW COMPARISON:  03/10/2015; 02/20/2014; 08/12/2012 FINDINGS: Grossly unchanged cardiac silhouette and mediastinal contours with atherosclerotic plaque within the thoracic aorta. The pulmonary vasculature is indistinct with cephalization of flow. Interval development of small to moderate sized layering bilateral effusions with associated bilateral mid and lower lung heterogeneous / consolidative opacities. No pneumothorax. No acute osseous abnormalities. IMPRESSION: Findings compatible with pulmonary edema with small to moderate size bilateral effusions and associated bilateral mid and lower lung opacities, atelectasis versus infiltrate. Electronically Signed   By: Simonne Come M.D.   On: 09/07/2015 08:45   Ct Chest Wo Contrast  09/09/2015  CLINICAL DATA:  Progressive shortness of breath for 1 week EXAM: CT CHEST WITHOUT CONTRAST TECHNIQUE: Multidetector CT imaging of the chest was performed following the standard protocol without IV contrast. COMPARISON:  Chest radiograph Sep 07, 2015 FINDINGS: Mediastinum/Lymph Nodes: Thyroid  appears unremarkable. There are multiple small lymph nodes throughout the mediastinum. There are mildly enlarged lymph nodes anterior to the carina, largest lymph node measuring 1.7 x 1.4 cm. There are borderline prominent lymph nodes in the hila ; the absence of intravenous contrast makes delineation of lymph nodes from adjacent vessels difficult in this circumstance. There is prominence of the ascending aorta with a maximum transverse diameter of 4.4 x 4.0 cm. The visualized great vessels appear unremarkable on this noncontrast enhanced study except for mild calcification at the origins of the left and right common carotid arteries. There are foci of  atherosclerotic calcification in the aorta. There are multiple foci of coronary artery calcification. The pericardium is not thickened. Lungs/Pleura: There are sizable free-flowing pleural effusions bilaterally. There is bibasilar airspace consolidation. There is patchy infiltrate in the posterior segment of the left upper lobe. There are areas of bronchiectatic change in the upper lobes bilaterally. No interstitial edema is evident. Upper abdomen: In the visualized upper abdomen, atherosclerotic calcification is noted in the aorta and left renal artery. Visualized upper abdominal structures otherwise appear unremarkable. Musculoskeletal: There is degenerative change in the thoracic spine. There are no blastic or lytic bone lesions. IMPRESSION: Moderate free-flowing pleural effusions bilaterally with extensive lower lobe airspace consolidation bilaterally. Patchy infiltrate is noted in the posterior segment of the left upper lobe. No focal interstitial edema appreciable. Atherosclerotic calcification is noted in the aorta with multiple foci of coronary artery calcification noted. Prominence of the ascending thoracic aorta with a maximum transverse diameter 4.4 x 4.0 cm. Recommend annual imaging followup by CTA or MRA. This recommendation follows 2010 ACCF/AHA/AATS/ACR/ASA/SCA/SCAI/SIR/STS/SVM Guidelines for the Diagnosis and Management of Patients with Thoracic Aortic Disease. Circulation. 2010; 121: S496-P591. Mildly prominent lymph nodes, likely reactive in etiology. Electronically Signed   By: Bretta Bang III M.D.   On: 09/09/2015 14:27   Dg Chest Left Decubitus  09/11/2015  CLINICAL DATA:  Short of breath. History of hypertension and diabetes. Pleural effusions on recent CT. EXAM: CHEST - LEFT DECUBITUS COMPARISON:  Chest CT, 09/09/2015.  Chest radiograph, 09/07/2015. FINDINGS: Pleural fluid layers along the dependent aspects of the right and left hemi thoraces on the decubitus view. There is additional  lung base opacity bilaterally that is likely atelectasis. Pneumonia is not excluded. There is no evidence of pulmonary edema. IMPRESSION: Bilateral free-flowing pleural effusions. Electronically Signed   By: Amie Portland M.D.   On: 09/11/2015 09:55   US Abdomen Limited Ruq  09/10/2015  CLINICAL DATA:  Elevated liver function tests.  Initial encounter. EXAM: US ABDOMEN LIMITED - RIGHT UPPER QUADRANT COMPARISON:  Abdominal ultrasound 02/21/2014. FINDINGS: Gallbladder: No gallstones or wall thickening visualized. No sonographic Murphy sign noted by sonographer. Common bile duct: Diameter: 0.2 cm Liver: No focal lesion identified. Within normal limits in parenchymal echogenicity. IMPRESSION: Negative exam. Electronically Signed   By: Drusilla Kanner M.D.   On: 09/10/2015 15:50   US Thoracentesis Asp Pleural Space W/img Guide  09/11/2015  INDICATION: Shortness of breath, congestive heart failure. Large right pleural effusion. Request diagnostic and therapeutic thoracentesis. EXAM: ULTRASOUND GUIDED RIGHT THORACENTESIS MEDICATIONS: None. COMPLICATIONS: None immediate. PROCEDURE: An ultrasound guided thoracentesis was thoroughly discussed with the patient and questions answered. The benefits, risks, alternatives and complications were also discussed. The patient understands and wishes to proceed with the procedure. Written consent was obtained. Ultrasound was performed to localize and mark an adequate pocket of fluid in the right chest. The area was then prepped and draped in the  normal sterile fashion. 1% Lidocaine was used for local anesthesia. Under ultrasound guidance a Safe-T-Centesis catheter was introduced. Thoracentesis was performed. The catheter was removed and a dressing applied. FINDINGS: A total of approximately 1.8 L of clear, amber colored fluid was removed. Samples were sent to the laboratory as requested by the clinical team. IMPRESSION: Successful ultrasound guided right thoracentesis yielding 1.8  L of pleural fluid. Read by: Brayton El PA-C Electronically Signed   By: Gilmer Mor D.O.   On: 09/11/2015 13:54    Microbiology: Recent Results (from the past 240 hour(s))  MRSA PCR Screening     Status: None   Collection Time: 09/07/15  5:28 PM  Result Value Ref Range Status   MRSA by PCR NEGATIVE NEGATIVE Final    Comment:        The GeneXpert MRSA Assay (FDA approved for NASAL specimens only), is one component of a comprehensive MRSA colonization surveillance program. It is not intended to diagnose MRSA infection nor to guide or monitor treatment for MRSA infections.   Culture, body fluid-bottle     Status: None   Collection Time: 09/11/15  1:44 PM  Result Value Ref Range Status   Specimen Description FLUID PLEURAL  Final   Special Requests BOTTLES DRAWN AEROBIC AND ANAEROBIC 10CC  Final   Culture   Final    NO GROWTH 5 DAYS Performed at [facility]    Report Status 09/16/2015 FINAL  Final  Gram stain     Status: None   Collection Time: 09/11/15  1:44 PM  Result Value Ref Range Status   Specimen Description FLUID PLEURAL  Final   Special Requests NONE  Final   Gram Stain   Final    ABUNDANT WBC PRESENT, PREDOMINANTLY MONONUCLEAR NO ORGANISMS SEEN Performed at [facility]    Report Status 09/11/2015 FINAL  Final  Culture, expectorated sputum-assessment     Status: None   Collection Time: 09/13/15  6:27 PM  Result Value Ref Range Status   Specimen Description SPUTUM  Final   Special Requests NONE  Final   Sputum evaluation   Final    THIS SPECIMEN IS ACCEPTABLE. RESPIRATORY CULTURE REPORT TO FOLLOW.   Report Status 09/13/2015 FINAL  Final  Culture, respiratory (NON-Expectorated)     Status: None   Collection Time: 09/13/15  6:30 PM  Result Value Ref Range Status   Specimen Description SPUTUM  Final   Special Requests NONE  Final   Gram Stain   Final    FEW WBC PRESENT,BOTH PMN AND MONONUCLEAR NO ORGANISMS SEEN FEW GRAM POSITIVE COCCI IN  CLUSTERS IN PAIRS Performed at [facility]    Culture   Final    NORMAL OROPHARYNGEAL FLORA Performed at [facility]    Report Status 09/16/2015 FINAL  Final     Labs: Basic Metabolic Panel:  Recent Labs Lab 09/11/15 0507 09/12/15 0516 09/13/15 0538 09/14/15 0518 09/16/15 0535  NA 133* 132* 133* 134* 138  K 4.3 4.1 4.1 4.4 4.5  CL 102 100* 103 105 106  CO2 26 24 24 23 26   GLUCOSE 295* 271* 216* 251* 142*  BUN 24* 19 17 19 18   CREATININE 1.17 1.15 1.20 1.12 1.22  CALCIUM 8.1* 8.0* 8.1* 8.1* 8.4*  MG 1.8 1.8 2.0 1.9 1.9   Liver Function Tests:  Recent Labs Lab 09/10/15 0516 09/14/15 0518  AST 50* 38  ALT 35 32  ALKPHOS 82 81  BILITOT 1.0 0.4  PROT 6.0*  5.9*  ALBUMIN 2.5* 2.5*   No results for input(s): LIPASE, AMYLASE in the last 168 hours. No results for input(s): AMMONIA in the last 168 hours. CBC:  Recent Labs Lab 09/14/15 0518  WBC 4.8  HGB 13.4  HCT 39.4  MCV 87.4  PLT 216   Cardiac Enzymes: No results for input(s): CKTOTAL, CKMB, CKMBINDEX, TROPONINI in the last 168 hours. BNP: BNP (last 3 results)  Recent Labs  09/07/15 0805 09/12/15 0516 09/14/15 0518  BNP 1076.5* 753.4* 862.7*    ProBNP (last 3 results) No results for input(s): PROBNP in the last 8760 hours.  CBG:  Recent Labs Lab 09/15/15 1643 09/15/15 2122 09/16/15 0751 09/16/15 1215 09/16/15 1643  GLUCAP 249* 212* 185* 287* 185*       Signed:  Jeralyn Bennett MD, PhD  [facility] 09/16/2015, 5:00 PM

## 2015-09-15 NOTE — Progress Notes (Signed)
PROGRESS NOTE    [name]  ZOX:[id number] DOB: 1955/11/29 DOA: 09/07/2015 PCP: [facility]    Brief Narrative: [name] is a 60 y.o. male with medical history significant of chronic combined systolic and diastolic congestive heart failure, last transthoracic echocardiogram performed on 03/10/2015 that showed an EF of 30-35% with grade 3 diastolic dysfunction, history of hypertension, insulin-dependent diabetes mellitus, coronary artery disease, as well as having ongoing alcohol abuse as well as history of cocaine use. He presents to the emergency room, intoxicated, reports last drinking the  Morning of admission. He came in with complaints of shortness of breath becoming progressively worse over the past week.  Patient admitted with acute on chronic combine Heart failure exacerbation, he was treated for the same with IV lasix. He was started on CIWA due to high risk for alcohol withdrawal.      Assessment & Plan:   Principal Problem:   Acute on chronic combined systolic (congestive) and diastolic (congestive) heart failure (HCC) Active Problems:   Diabetes mellitus, type II (HCC)   Hypertension   Cardiomyopathy, nonischemic (HCC)   ETOH abuse   Alcohol dependence with withdrawal delirium (HCC)   CHF (congestive heart failure) (HCC)   Pleural effusion on right   Insulin dependent diabetes mellitus (HCC)   Hepatitis, viral   DNR (do not resuscitate) discussion   Palliative care encounter   Polysubstance abuse   Pleural effusion  Acute on chronic combined systolic and diastolic congestive heart failure Last ECHO ; 03/10/2015 that showed an EF of 30-35% with grade 3 diastolic dysfunction Repeat echo with reduced EF at 25-35%, lisinopril, imdur held initially due to hypotension and cr elevated, restarted low dose lisinopril at discharge, coreg dose reduced 3.125 bid,  s/p IV Lasix 40 mg twice daily since admission, lasix d/ced on 5/17 due to elevation of cr,  low dose oral lasix restarted on 5/18, cr stable so far on low dose lasix Developed hypotension on 5/21, coreg and lasix held, 500c ns bolus given on 5/21 bp better on 5/22, cardiology restart lasix, repeat cxr with re accumulation of pleural effusion, cardiology recommended palliative care consult for goal of care, palliative care consulted, input appreciated, please refer to palliative care note on 5/22 for detail.  Patient is awaiting Pas[facility] to be discharged to snf  Discharge meds : low dose coreg/lisinopril/lasix, close monitor blood pressure, cr, close follow up with cardiology, may need to repeat thoracentesis in a few days.  Pleural effusion: ct chest + moderate pleural effusion bilaterally with extensive lower lobe airspace consolidation, + atherosclerotic calcification.  s/p thoracentesis on 5/19 due to persistent sob, with 1.8 liter of pleural fluids drained. Pleural fluids lymphocyte predominent, total protein less than 3, ldh 45, gram stain no organism, likely transudate Currently no leukocytosis, no fever, sputum culture no growth Lasix held on 5/21 due to hypotension, sbp in 80's. Restart lasix on 5/22, repeat cxr with re accumulation of pleural effusion Continue lasix, may need to repeat thoracentesis in a few days, close cardiology follow up.  NSVT: 6 beats on tele on 5/20 and 5/22, iv mag given, continue coreg. Keep k >4, mag>2. Discharge on oral mag daily  Cr elevation: from overdiuresing? Lasix held on 5/17, resumed on 5/18, lasix titration per cardiology, cr stable at 1.12 at discharge   Type 2 diabetes mellitus, insulin-dependent, a1c 11.7, Patient admitted that he has been noncompliant with meds and diet, metformin held in the hospital, restart at discharge He is on lantus  and ssi   pmd to continue monitor blood sugar control and adjust insulin dose prn,   Hypertension:  lisinopril, imdur held and coreg dose resuded due to soft SBP  Hypotension sbp in the  80's on 5/21, coreg and lasix held , 500cc ns bolus ordered. Restarted lasix and coreg on 5/22. Continue hold lisinopril and imdur due to borderline bp. bp better at discharge, he is to be discharged on lasix/coreg /lisinopril, close monitor bp and cr,   History of coronary artery disease. Currently denies chest pain, troponin negative at 0.08 in the emergency department. Place him on continuous cardiac monitoring. Last cath in 2014 moderate nonobstructive, nonischemic cardiomyopathy, patient has not been following up with cardiology   Depression; unclear if he has been taking Zoloft, Wellbutrin, hold to avoid sedation.  He is on trazodone qhs  Chronic alcohol abuse; reported history of seizure from alcohol withdrawal, at risk for DT. on CIWA protocol. No overt withdrawal problem.   Substance abuse;  reported continued drink heavily and use drug, urine tox screen +cocaine ,  Patient reported substance abuse due to stress at home, advise patent to seem family counseling by pmd at [place]. 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 hcv rna significantly elevated at [id number]. [facility]. He is advised to stop alcohol and illicit drug use Patient report [facility] plan to [facility] his hepc in the near future.      DVT prophylaxis: Lovenox Code Status: partial code, no cpr, no intubation, ok with acls drugs,  Family Communication: care discussed with patient and his fiance in room Disposition Plan: SNF  Consultants:   Cardiology  Palliative care  Procedures:   US guided thoracentesis with 1.8 liter removed  Antimicrobials:   none   Subjective: Feeling better, still weak, no cough noticed today,  no fever, no leukocytosis, on room air at rest,  denies chest pain,No edema,    No overt sign of alcohol  withdrawal  Objective: Filed Vitals:   09/14/15 2110 09/15/15 0635 09/15/15 0944 09/15/15 1440  BP: 122/86 127/81 127/93 107/79  Pulse: 84 79 79 74  Temp: 97.9 F (36.6 C) 97.4 F (36.3 C)  97.7 F (36.5 C)  TempSrc: Oral Oral    Resp: 20 20  20   Height:      Weight:  74.571 kg (164 lb 6.4 oz)    SpO2: 100% 97%  98%    Intake/Output Summary (Last 24 hours) at 09/15/15 1531 Last data filed at 09/15/15 0645  Gross per 24 hour  Intake    410 ml  Output    825 ml  Net   -415 ml   Filed Weights   09/13/15 0604 09/14/15 0549 09/15/15 0635  Weight: 72.8 kg (160 lb 7.9 oz) 72.485 kg (159 lb 12.8 oz) 74.571 kg (164 lb 6.4 oz)    Examination:  General exam: weak Respiratory system: Respiratory effort normal. Bilateral crackles has largely resolved, diminished at basis, No wheezing, no rhonchi,  Cardiovascular system: S1 & S2 heard, RRR. No pedal edema. Gastrointestinal system: Abdomen is nondistended, soft and nontender. No organomegaly or masses felt. Normal bowel sounds heard. Central nervous system: aaox3, no focal weakness, no asterixis Extremities: Symmetric 5 x 5 power. Skin: No rashes, lesions or ulcers     Data Reviewed: I have personally  reviewed following labs and imaging studies  CBC:  Recent Labs Lab 09/09/15 0317 09/14/15 0518  WBC 6.4 4.8  HGB 13.2 13.4  HCT 38.7* 39.4  MCV 87.2 87.4  PLT 213 216   Basic Metabolic Panel:  Recent Labs Lab 09/10/15 0516 09/11/15 0507 09/12/15 0516 09/13/15 0538 09/14/15 0518  NA 133* 133* 132* 133* 134*  K 3.6 4.3 4.1 4.1 4.4  CL 103 102 100* 103 105  CO2 GLUCOSE 231* 295* 271* 216* 251*  BUN 25* 24* CREATININE 1.09 1.17 1.15 1.20 1.12  CALCIUM 7.9* 8.1* 8.0* 8.1* 8.1*  MG 1.8 1.8 1.8 2.0 1.9   GFR: Estimated Creatinine Clearance: 73.1 mL/min (by C-G formula based on Cr of 1.12). Liver Function Tests:  Recent Labs Lab 09/10/15 0516 09/14/15 0518  AST 50* 38  ALT 35 32   ALKPHOS 82 81  BILITOT 1.0 0.4  PROT 6.0* 5.9*  ALBUMIN 2.5* 2.5*   No results for input(s): LIPASE, AMYLASE in the last 168 hours. No results for input(s): AMMONIA in the last 168 hours. Coagulation Profile: No results for input(s): INR, PROTIME in the last 168 hours. Cardiac Enzymes: No results for input(s): CKTOTAL, CKMB, CKMBINDEX, TROPONINI in the last 168 hours. BNP (last 3 results) No results for input(s): PROBNP in the last 8760 hours. HbA1C: No results for input(s): HGBA1C in the last 72 hours. CBG:  Recent Labs Lab 09/14/15 1225 09/14/15 1703 09/14/15 2108 09/15/15 0751 09/15/15 1142  GLUCAP 284* 232* 188* 143* 262*   Lipid Profile: No results for input(s): CHOL, HDL, LDLCALC, TRIG, CHOLHDL, LDLDIRECT in the last 72 hours. Thyroid Function Tests: No results for input(s): TSH, T4TOTAL, FREET4, T3FREE, THYROIDAB in the last 72 hours. Anemia Panel: No results for input(s): VITAMINB12, FOLATE, FERRITIN, TIBC, IRON, RETICCTPCT in the last 72 hours. Sepsis Labs: No results for input(s): PROCALCITON, LATICACIDVEN in the last 168 hours.  Recent Results (from the past 240 hour(s))  MRSA PCR Screening     Status: None   Collection Time: 09/07/15  5:28 PM  Result Value Ref Range Status   MRSA by PCR NEGATIVE NEGATIVE Final    Comment:        The GeneXpert MRSA Assay (FDA approved for NASAL specimens only), is one component of a comprehensive MRSA colonization surveillance program. It is not intended to diagnose MRSA infection nor to guide or monitor treatment for MRSA infections.   Culture, body fluid-bottle     Status: None (Preliminary result)   Collection Time: 09/11/15  1:44 PM  Result Value Ref Range Status   Specimen Description FLUID PLEURAL  Final   Special Requests BOTTLES DRAWN AEROBIC AND ANAEROBIC 10CC  Final   Culture   Final    NO GROWTH 4 DAYS Performed at [facility]    Report Status PENDING  Incomplete  Gram stain     Status: None    Collection Time: 09/11/15  1:44 PM  Result Value Ref Range Status   Specimen Description FLUID PLEURAL  Final   Special Requests NONE  Final   Gram Stain   Final    ABUNDANT WBC PRESENT, PREDOMINANTLY MONONUCLEAR NO ORGANISMS SEEN Performed at [facility]    Report Status 09/11/2015 FINAL  Final  Culture, expectorated sputum-assessment     Status: None   Collection Time: 09/13/15  6:27 PM  Result Value Ref Range Status   Specimen Description SPUTUM  Final   Special  Requests NONE  Final   Sputum evaluation   Final    THIS SPECIMEN IS ACCEPTABLE. RESPIRATORY CULTURE REPORT TO FOLLOW.   Report Status 09/13/2015 FINAL  Final  Culture, respiratory (NON-Expectorated)     Status: None (Preliminary result)   Collection Time: 09/13/15  6:30 PM  Result Value Ref Range Status   Specimen Description SPUTUM  Final   Special Requests NONE  Final   Gram Stain   Final    FEW WBC PRESENT,BOTH PMN AND MONONUCLEAR NO ORGANISMS SEEN FEW GRAM POSITIVE COCCI IN CLUSTERS IN PAIRS Performed at [facility]    Culture   Final    NORMAL OROPHARYNGEAL FLORA Performed at [facility]    Report Status PENDING  Incomplete         Radiology Studies: Dg Chest 2 View  09/14/2015  CLINICAL DATA:  Cough for 2 days. Patient admitted with heart failure and shortness of breath. Thoracentesis 2 days prior. EXAM: CHEST  2 VIEW COMPARISON:  Radiograph 09/11/2015 FINDINGS: Bilateral pleural effusions, small to moderate moderate in size, increased on the right from prior exam. Left pleural effusion is unchanged. There are associated bibasilar opacities, favor to be compressive atelectasis. Increasing interstitial prominence, likely minimal pulmonary edema. Cardiomediastinal contours are unchanged. No pneumothorax. IMPRESSION: 1. Increasing size of right pleural effusion post thoracentesis, small to moderate in size. Unchanged small to moderate left pleural effusion. Associated compressive  atelectasis. 2. Increased interstitial opacities suspicious for mild pulmonary edema. 3. Overall findings suspicious for worsening CHF. Electronically Signed   By: Rubye Oaks M.D.   On: 09/14/2015 01:33        Scheduled Meds: . antiseptic oral rinse  7 mL Mouth Rinse BID  . aspirin EC  81 mg Oral Daily  . carvedilol  3.125 mg Oral BID WC  . enoxaparin (LOVENOX) injection  40 mg Subcutaneous Q24H  . folic acid  1 mg Oral Daily  . furosemide  40 mg Intravenous Daily  . guaiFENesin  600 mg Oral BID  . insulin aspart  0-15 Units Subcutaneous TID WC  . insulin aspart  0-5 Units Subcutaneous QHS  . insulin aspart  3 Units Subcutaneous TID WC  . insulin glargine  15 Units Subcutaneous QHS  . lisinopril  5 mg Oral Daily  . magnesium oxide  400 mg Oral Daily  . multivitamin with minerals  1 tablet Oral Daily  . pravastatin  10 mg Oral q1800  . senna-docusate  1 tablet Oral BID  . sodium chloride flush  3 mL Intravenous Q12H  . thiamine  100 mg Oral Daily  . traZODone  100 mg Oral QHS   Continuous Infusions:    LOS: 8 days    Time spent: 25 minutes.     Albertine Grates, MD PhD [facility] Pager [phone]  If 7PM-7AM, please contact night-coverage [email or web address] Password [facility] 09/15/2015, 3:31 PM

## 2015-09-15 NOTE — Progress Notes (Signed)
Patient was set to discharge to [name] today, pending [name].   CSW received notification that patient has been referred to [name] for [name] which requires that an evaluation be performed to assess placement and service needs through the [name]) process.  CSW will await call from [name] for evaluation. [name] & [name] at [name] aware.    Thomas Maxin, LCSW [facility] Clinical Social Worker cell #: [phone]

## 2015-09-15 NOTE — Progress Notes (Signed)
Patient Name: [name] Date of Encounter: 09/15/2015  Principal Problem:   Acute on chronic combined systolic (congestive) and diastolic (congestive) heart failure (HCC) Active Problems:   Diabetes mellitus, type II (HCC)   Hypertension   Cardiomyopathy, nonischemic (HCC)   ETOH abuse   Alcohol dependence with withdrawal delirium (HCC)   CHF (congestive heart failure) (HCC)   Pleural effusion on right   Insulin dependent diabetes mellitus (HCC)   Hepatitis, viral   DNR (do not resuscitate) discussion   Palliative care encounter   Primary Cardiologist: Dr Shirlee Latch, Dr Clifton James  Patient Profile: 60 y.o. male with a history of NICM/chronic combined S/D CHF (EF 30-35%/G3DD), HTN, IDDM, CAD and ongoing alcohol and cocaine abuse who presented to [facility] on 09/07/15 with SOB while intoxicated.   SUBJECTIVE: Says breathing at baseline, no chest pain, no palpitations  OBJECTIVE Filed Vitals:   09/14/15 1318 09/14/15 1807 09/14/15 2110 09/15/15 0635  BP: 139/88 127/82 122/86 127/81  Pulse: 85 81 84 79  Temp: 97.3 F (36.3 C)  97.9 F (36.6 C) 97.4 F (36.3 C)  TempSrc: Oral  Oral Oral  Resp: 20 18 20 20   Height:      Weight:    164 lb 6.4 oz (74.571 kg)  SpO2: 97% 97% 100% 97%    Intake/Output Summary (Last 24 hours) at 09/15/15 0756 Last data filed at 09/15/15 0645  Gross per 24 hour  Intake    890 ml  Output   1475 ml  Net   -585 ml   Filed Weights   09/13/15 0604 09/14/15 0549 09/15/15 0635  Weight: 160 lb 7.9 oz (72.8 kg) 159 lb 12.8 oz (72.485 kg) 164 lb 6.4 oz (74.571 kg)    PHYSICAL EXAM General: Well developed, well nourished, male in no acute distress. Head: Normocephalic, atraumatic.  Neck: Supple without bruits, JVD 8 cm. Lungs:  Resp regular and unlabored, decreased BS bases, R>L. Heart: RRR, S1, S2, no S3, S4, or murmur; no rub. Abdomen: Soft, non-tender, non-distended, BS + x 4.  Extremities: No clubbing, cyanosis, edema.  Neuro: Alert and  oriented X 3. Moves all extremities spontaneously. Psych: Normal affect.  LABS: CBC: Recent Labs  09/14/15 0518  WBC 4.8  HGB 13.4  HCT 39.4  MCV 87.4  PLT 216   Basic Metabolic Panel: Recent Labs  09/13/15 0538 09/14/15 0518  NA 133* 134*  K 4.1 4.4  CL 103 105  CO2 24 23  GLUCOSE 216* 251*  BUN 17 19  CREATININE 1.20 1.12  CALCIUM 8.1* 8.1*  MG 2.0 1.9   Liver Function Tests: Recent Labs  09/14/15 0518  AST 38  ALT 32  ALKPHOS 81  BILITOT 0.4  PROT 5.9*  ALBUMIN 2.5*   BNP:  B NATRIURETIC PEPTIDE  Date/Time Value Ref Range Status  09/14/2015 05:18 AM 862.7* 0.0 - 100.0 pg/mL Final  09/12/2015 05:16 AM 753.4* 0.0 - 100.0 pg/mL Final   TELE:  SR, PVCs and pairs      Radiology/Studies: Dg Chest 2 View 09/14/2015  CLINICAL DATA:  Cough for 2 days. Patient admitted with heart failure and shortness of breath. Thoracentesis 2 days prior. EXAM: CHEST  2 VIEW COMPARISON:  Radiograph 09/11/2015 FINDINGS: Bilateral pleural effusions, small to moderate moderate in size, increased on the right from prior exam. Left pleural effusion is unchanged. There are associated bibasilar opacities, favor to be compressive atelectasis. Increasing interstitial prominence, likely minimal pulmonary edema. Cardiomediastinal contours are unchanged. No pneumothorax.  IMPRESSION: 1. Increasing size of right pleural effusion post thoracentesis, small to moderate in size. Unchanged small to moderate left pleural effusion. Associated compressive atelectasis. 2. Increased interstitial opacities suspicious for mild pulmonary edema. 3. Overall findings suspicious for worsening CHF. Electronically Signed   By: Rubye Oaks M.D.   On: 09/14/2015 01:33     Current Medications:  . antiseptic oral rinse  7 mL Mouth Rinse BID  . aspirin EC  81 mg Oral Daily  . carvedilol  3.125 mg Oral BID WC  . enoxaparin (LOVENOX) injection  40 mg Subcutaneous Q24H  . folic acid  1 mg Oral Daily  . furosemide   40 mg Intravenous Daily  . guaiFENesin  600 mg Oral BID  . insulin aspart  0-15 Units Subcutaneous TID WC  . insulin aspart  0-5 Units Subcutaneous QHS  . insulin aspart  3 Units Subcutaneous TID WC  . insulin glargine  15 Units Subcutaneous QHS  . multivitamin with minerals  1 tablet Oral Daily  . pravastatin  10 mg Oral q1800  . sodium chloride flush  3 mL Intravenous Q12H  . thiamine  100 mg Oral Daily  . traZODone  100 mg Oral QHS      ASSESSMENT AND PLAN: Acute on chronic combined S/D CHF:  reapeat 2D ECHO with mildly worsening EF to 25-30%, mod MR, mild LAE, mild-mod RV dysfunction, PA pressure 40, trivial pericardial effusion with left pleural effusion.  -- BNP of 1076(5/15), 753(5/20), 862 (05/22),  chest x-ray 5/22: Increasing size of right pleural effusion post thoracentesis, small to moderate in size. Unchanged small to moderate left pleural effusion. Associated compressive atelectasis. 2. Increased interstitial opacities suspicious for mild pulmonary edema. 3. Overall findings suspicious for worsening CHF.  S/P thoracentesis 5/19 with 1.8 Liters removed.  --Net fluids: -6Lsince admi -- restarted on IV Lasix 40 mg qd 05/22 -- decide on repeat thoracentesis -- on Coreg 3.125mg  BID, lisinopril discontinued due to AKI(resolved) and soft BPs. Creat is stable with diuresis. Would prefer Coreg over lisinopril  Elevated troponin: 0.08--> 0.06. Low and flat c/w demand ischemia. No plans for cath   HTN: BPs soft. Lisinopril and imdur stopped, per IM  Type 2 diabetes mellitus: metformin held, HgA1c 11.7, per IM  History of coronary artery disease: non obst on cath in 2014.   Polysubstance abuse: UDS + cocaine, benzos and was intoxicated on alcohol on admission. On CIWA protocol. Long term prognosis very poor if he cannot quit drinking and using drugs, per IM  Non compliance: does not follow up as an outpatient and continues to abuse drugs and alcohol   Seen by  Palliative Care and is partial code with meds only.  Dispo: possible SNF for rehab, then pt wants drug rehab. Ready for d/c to rehab once cleared by cards.   Otherwise, per IM Principal Problem:   Acute on chronic combined systolic (congestive) and diastolic (congestive) heart failure (HCC) Active Problems:   Diabetes mellitus, type II (HCC)   Hypertension   Cardiomyopathy, nonischemic (HCC)   ETOH abuse   Alcohol dependence with withdrawal delirium (HCC)   CHF (congestive heart failure) (HCC)   Pleural effusion on right   Insulin dependent diabetes mellitus (HCC)   Hepatitis, viral   DNR (do not resuscitate) discussion   Palliative care encounter   Melida Quitter , PA-C 7:56 AM 09/15/2015

## 2015-09-15 NOTE — Care Management Note (Signed)
Case Management Note  Patient Details  Name: [name] MRN: [id number] Date of Birth: Dec 01, 1955  Subjective/Objective:                    Action/Plan:d/c SNF   Expected Discharge Date:   (UNKNOWN)               Expected Discharge Plan:  Skilled Nursing Facility  In-House Referral:  Clinical Social Work  Discharge planning Services  CM Consult  Post Acute Care Choice:    Choice offered to:     DME Arranged:    DME Agency:     HH Arranged:    HH Agency:     Status of Service:  Completed, signed off  Medicare Important Message Given:  Yes Date Medicare IM Given:    Medicare IM give by:    Date Additional Medicare IM Given:    Additional Medicare Important Message give by:     If discussed at Long Length of Stay Meetings, dates discussed:    Additional Comments:  Lanier Clam, RN 09/15/2015, 12:31 PM

## 2015-09-16 DIAGNOSIS — F1910 Other psychoactive substance abuse, uncomplicated: Secondary | ICD-10-CM

## 2015-09-16 DIAGNOSIS — R05 Cough: Secondary | ICD-10-CM

## 2015-09-16 LAB — BASIC METABOLIC PANEL
Anion gap: 6 (ref 5–15)
BUN: 18 mg/dL (ref 6–20)
CO2: 26 mmol/L (ref 22–32)
CREATININE: 1.22 mg/dL (ref 0.61–1.24)
Calcium: 8.4 mg/dL — ABNORMAL LOW (ref 8.9–10.3)
Chloride: 106 mmol/L (ref 101–111)
GFR calc Af Amer: >60 mL/min (ref >60)
Glucose, Bld: 142 mg/dL — ABNORMAL HIGH (ref 65–99)
POTASSIUM: 4.5 mmol/L (ref 3.5–5.1)
SODIUM: 138 mmol/L (ref 135–145)

## 2015-09-16 LAB — CULTURE, BODY FLUID W GRAM STAIN -BOTTLE

## 2015-09-16 LAB — CULTURE, RESPIRATORY W GRAM STAIN: Culture: NORMAL

## 2015-09-16 LAB — CULTURE, BODY FLUID-BOTTLE: CULTURE: NO GROWTH

## 2015-09-16 LAB — GLUCOSE, CAPILLARY
GLUCOSE-CAPILLARY: 185 mg/dL — AB (ref 65–99)
Glucose-Capillary: 185 mg/dL — ABNORMAL HIGH (ref 65–99)
Glucose-Capillary: 287 mg/dL — ABNORMAL HIGH (ref 65–99)

## 2015-09-16 LAB — CULTURE, RESPIRATORY

## 2015-09-16 LAB — MAGNESIUM: MAGNESIUM: 1.9 mg/dL (ref 1.7–2.4)

## 2015-09-16 MED ORDER — PRAVASTATIN SODIUM 10 MG PO TABS: 10.0000 mg | ORAL_TABLET | Freq: Every day | ORAL | Status: DC

## 2015-09-16 MED ORDER — CARVEDILOL 6.25 MG PO TABS: 6.2500 mg | ORAL_TABLET | Freq: Two times a day (BID) | ORAL | Status: DC

## 2015-09-16 MED ORDER — INSULIN GLARGINE 100 UNIT/ML SOLOSTAR PEN: 20.0000 [IU] | PEN_INJECTOR | Freq: Every morning | SUBCUTANEOUS | Status: DC

## 2015-09-16 MED ORDER — FUROSEMIDE 40 MG PO TABS: 40.0000 mg | ORAL_TABLET | Freq: Every day | ORAL | Status: DC

## 2015-09-16 MED ORDER — POTASSIUM CHLORIDE ER 20 MEQ PO TBCR: 20.0000 meq | EXTENDED_RELEASE_TABLET | ORAL | Status: DC

## 2015-09-16 MED ORDER — LISINOPRIL 5 MG PO TABS: 5.0000 mg | ORAL_TABLET | Freq: Every day | ORAL | Status: DC

## 2015-09-16 MED ORDER — CARVEDILOL 6.25 MG PO TABS
6.2500 mg | ORAL_TABLET | Freq: Two times a day (BID) | ORAL | Status: DC
  Administered 2015-09-16: 6.25 mg via ORAL
  Filled 2015-09-16: qty 1

## 2015-09-16 MED ORDER — LORAZEPAM 2 MG/ML IJ SOLN
0.5000 mg | Freq: Once | INTRAMUSCULAR | Status: AC
  Administered 2015-09-16: 0.5 mg via INTRAVENOUS
  Filled 2015-09-16: qty 1

## 2015-09-16 NOTE — Care Management Note (Signed)
Case Management Note  Patient Details  Name: [name] MRN: [id number] Date of Birth: February 22, 1956  Subjective/Objective: PT-recc no f/u.  Spoke to sister-her concerns are that patient is not going back to stay with their mother,& she cannot take hime to stay with her, she feels he needs drug rehab. Informed her of resources-homeless shelters, & drug rehab services that CSW can talk to her more in detail-notified CSW.                   Action/Plan:d/c home.   Expected Discharge Date:   (UNKNOWN)               Expected Discharge Plan:  Home/Self Care  In-House Referral:  Clinical Social Work  Discharge planning Services  CM Consult  Post Acute Care Choice:    Choice offered to:     DME Arranged:    DME Agency:     HH Arranged:    HH Agency:     Status of Service:  Completed, signed off  Medicare Important Message Given:  Yes Date Medicare IM Given:    Medicare IM give by:    Date Additional Medicare IM Given:    Additional Medicare Important Message give by:     If discussed at Long Length of Stay Meetings, dates discussed:    Additional Comments:  Lanier Clam, RN 09/16/2015, 2:42 PM

## 2015-09-16 NOTE — Progress Notes (Signed)
PROGRESS NOTE    [name]  XMI:[id number] DOB: 08-Mar-1956 DOA: 09/07/2015 PCP: [facility]    Brief Narrative: [name] is a 60 y.o. male with medical history significant of chronic combined systolic and diastolic congestive heart failure, last transthoracic echocardiogram performed on 03/10/2015 that showed an EF of 30-35% with grade 3 diastolic dysfunction, history of hypertension, insulin-dependent diabetes mellitus, coronary artery disease, as well as having ongoing alcohol abuse as well as history of cocaine use. He presents to the emergency room, intoxicated, reports last drinking the  Morning of admission. He came in with complaints of shortness of breath becoming progressively worse over the past week.  Patient admitted with acute on chronic combine Heart failure exacerbation, he was treated for the same with IV lasix. He was started on CIWA due to high risk for alcohol withdrawal.    Assessment & Plan:   Principal Problem:   Acute on chronic combined systolic (congestive) and diastolic (congestive) heart failure (HCC) Active Problems:   Diabetes mellitus, type II (HCC)   Hypertension   Cardiomyopathy, nonischemic (HCC)   ETOH abuse   Alcohol dependence with withdrawal delirium (HCC)   CHF (congestive heart failure) (HCC)   Pleural effusion on right   Insulin dependent diabetes mellitus (HCC)   Hepatitis, viral   DNR (do not resuscitate) discussion   Palliative care encounter   Polysubstance abuse   Pleural effusion  Acute on chronic combined systolic and diastolic congestive heart failure Last ECHO ; 03/10/2015 that showed an EF of 30-35% with grade 3 diastolic dysfunction Repeat echo with reduced EF at 25-35%, lisinopril, imdur held initially due to hypotension and cr elevated, restarted low dose lisinopril at discharge, coreg dose reduced 3.125 bid,  s/p IV Lasix 40 mg twice daily since admission, lasix d/ced on 5/17 due to elevation of cr, low  dose oral lasix restarted on 5/18, cr stable so far on low dose lasix Developed hypotension on 5/21, coreg and lasix held, 500c ns bolus given on 5/21 bp better on 5/22, cardiology restart lasix, repeat cxr with re accumulation of pleural effusion, cardiology recommended palliative care consult for goal of care, palliative care consulted, input appreciated, please refer to palliative care note on 5/22 for detail.  Patient is awaiting [facility] to be discharged to  On 09/16/2015 patient remains stable. His Coreg was increased to 6.25 mg by mouth twice a day by cardiology. Remains on lisinopril 5 mg by mouth daily. Last blood pressure 110/82. Remains on Lasix 40 mg IV daily.  Pleural effusion: ct chest + moderate pleural effusion bilaterally with extensive lower lobe airspace consolidation, + atherosclerotic calcification.  s/p thoracentesis on 5/19 due to persistent sob, with 1.8 liter of pleural fluids drained. Pleural fluids lymphocyte predominent, total protein less than 3, ldh 45, gram stain no organism, likely transudate Currently no leukocytosis, no fever, sputum culture no growth Lasix held on 5/21 due to hypotension, sbp in 80's. Restart lasix on 5/22, repeat cxr with re accumulation of pleural effusion Continue lasix, may need to repeat thoracentesis in a few days, close cardiology follow up.  NSVT: 6 beats on tele on 5/20 and 5/22, iv mag given, continue coreg. Keep k >4, mag>2. Discharge on oral mag daily  Cr elevation: from overdiuresing? Lasix held on 5/17, resumed on 5/18, lasix titration per cardiology, cr stable at 1.12 at discharge  Type 2 diabetes mellitus, insulin-dependent, a1c 11.7, Patient admitted that he has been noncompliant with meds and diet, metformin held in  the hospital, restart at discharge  pmd to continue monitor blood sugar control and adjust insulin dose prn, -Continue Lantus 15 units subcutaneous at bedtime with sliding scale coverage  Hypertension:   lisinopril, imdur held and coreg dose resuded due to soft SBP  Hypotension sbp in the 80's on 5/21, coreg and lasix held , 500cc ns bolus ordered. Restarted lasix and coreg on 5/22. Continue hold lisinopril and imdur due to borderline bp. bp better at discharge, he is to be discharged on lasix/coreg /lisinopril, close monitor bp and cr,   History of coronary artery disease. Currently denies chest pain, troponin negative at 0.08 in the emergency department. Place him on continuous cardiac monitoring. Last cath in 2014 moderate nonobstructive, nonischemic cardiomyopathy, patient has not been following up with cardiology  Depression; unclear if he has been taking Zoloft, Wellbutrin, hold to avoid sedation.  He is on trazodone qhs  Chronic alcohol abuse; reported history of seizure from alcohol withdrawal, at risk for DT. on CIWA protocol. No overt withdrawal problem.   Substance abuse;  reported continued drink heavily and use drug, urine tox screen +cocaine ,  Patient reported substance abuse due to stress at home, advise patent to seem family counseling by pmd at [place]. 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 hcv rna significantly elevated at [id number]. [facility]. He is advised to stop alcohol and illicit drug use Patient report [facility] plan to [facility] his hepc in the near future.      DVT prophylaxis: Lovenox Code Status: partial code, no cpr, no intubation, ok with acls drugs,  Family Communication: care discussed with patient and his fiance in room Disposition Plan: SNF  Consultants:   Cardiology  Palliative care  Procedures:   US guided thoracentesis with 1.8 liter removed  Antimicrobials:   none   Subjective: [name] reports feeling about the same, has no complaints. Denies  tremors, hallucinations, or other symptoms suggestive of alcohol withdrawal.  Objective: Filed Vitals:   09/15/15 1440 09/15/15 1706 09/15/15 2119 09/16/15 0447  BP: 107/79 113/83 111/78 110/82  Pulse: 74 74 70 73  Temp: 97.7 F (36.5 C)  97.8 F (36.6 C) 97.8 F (36.6 C)  TempSrc:   Oral Oral  Resp: 20  18 18   Height:      Weight:    74.1 kg (163 lb 5.8 oz)  SpO2: 98%  97% 97%    Intake/Output Summary (Last 24 hours) at 09/16/15 1336 Last data filed at 09/16/15 0526  Gross per 24 hour  Intake    123 ml  Output    625 ml  Net   -502 ml   Filed Weights   09/14/15 0549 09/15/15 0635 09/16/15 0447  Weight: 72.485 kg (159 lb 12.8 oz) 74.571 kg (164 lb 6.4 oz) 74.1 kg (163 lb 5.8 oz)    Examination:  General exam: weak Respiratory system: Respiratory effort normal. Bilateral crackles has largely resolved, diminished at basis, No wheezing, no rhonchi,  Cardiovascular system: S1 & S2 heard, RRR. No pedal edema. Gastrointestinal system: Abdomen is nondistended, soft and nontender. No organomegaly or masses felt. Normal bowel sounds heard. Central nervous system: aaox3, no focal weakness, no asterixis Extremities: Symmetric 5 x 5 power. Skin: No rashes, lesions or ulcers     Data Reviewed: I have personally reviewed  following labs and imaging studies  CBC:  Recent Labs Lab 09/14/15 0518  WBC 4.8  HGB 13.4  HCT 39.4  MCV 87.4  PLT 216   Basic Metabolic Panel:  Recent Labs Lab 09/11/15 0507 09/12/15 0516 09/13/15 0538 09/14/15 0518 09/16/15 0535  NA 133* 132* 133* 134* 138  K 4.3 4.1 4.1 4.4 4.5  CL 102 100* 103 105 106  CO2 26 24 24 23 26   GLUCOSE 295* 271* 216* 251* 142*  BUN 24* 19 17 19 18   CREATININE 1.17 1.15 1.20 1.12 1.22  CALCIUM 8.1* 8.0* 8.1* 8.1* 8.4*  MG 1.8 1.8 2.0 1.9 1.9   GFR: Estimated Creatinine Clearance: 66.6 mL/min (by C-G formula based on Cr of 1.22). Liver Function Tests:  Recent Labs Lab 09/10/15 0516 09/14/15 0518  AST  50* 38  ALT 35 32  ALKPHOS 82 81  BILITOT 1.0 0.4  PROT 6.0* 5.9*  ALBUMIN 2.5* 2.5*   No results for input(s): LIPASE, AMYLASE in the last 168 hours. No results for input(s): AMMONIA in the last 168 hours. Coagulation Profile: No results for input(s): INR, PROTIME in the last 168 hours. Cardiac Enzymes: No results for input(s): CKTOTAL, CKMB, CKMBINDEX, TROPONINI in the last 168 hours. BNP (last 3 results) No results for input(s): PROBNP in the last 8760 hours. HbA1C: No results for input(s): HGBA1C in the last 72 hours. CBG:  Recent Labs Lab 09/15/15 1142 09/15/15 1643 09/15/15 2122 09/16/15 0751 09/16/15 1215  GLUCAP 262* 249* 212* 185* 287*   Lipid Profile: No results for input(s): CHOL, HDL, LDLCALC, TRIG, CHOLHDL, LDLDIRECT in the last 72 hours. Thyroid Function Tests: No results for input(s): TSH, T4TOTAL, FREET4, T3FREE, THYROIDAB in the last 72 hours. Anemia Panel: No results for input(s): VITAMINB12, FOLATE, FERRITIN, TIBC, IRON, RETICCTPCT in the last 72 hours. Sepsis Labs: No results for input(s): PROCALCITON, LATICACIDVEN in the last 168 hours.  Recent Results (from the past 240 hour(s))  MRSA PCR Screening     Status: None   Collection Time: 09/07/15  5:28 PM  Result Value Ref Range Status   MRSA by PCR NEGATIVE NEGATIVE Final    Comment:        The GeneXpert MRSA Assay (FDA approved for NASAL specimens only), is one component of a comprehensive MRSA colonization surveillance program. It is not intended to diagnose MRSA infection nor to guide or monitor treatment for MRSA infections.   Culture, body fluid-bottle     Status: None (Preliminary result)   Collection Time: 09/11/15  1:44 PM  Result Value Ref Range Status   Specimen Description FLUID PLEURAL  Final   Special Requests BOTTLES DRAWN AEROBIC AND ANAEROBIC 10CC  Final   Culture   Final    NO GROWTH 4 DAYS Performed at [facility]    Report Status PENDING  Incomplete  Gram  stain     Status: None   Collection Time: 09/11/15  1:44 PM  Result Value Ref Range Status   Specimen Description FLUID PLEURAL  Final   Special Requests NONE  Final   Gram Stain   Final    ABUNDANT WBC PRESENT, PREDOMINANTLY MONONUCLEAR NO ORGANISMS SEEN Performed at [facility]    Report Status 09/11/2015 FINAL  Final  Culture, expectorated sputum-assessment     Status: None   Collection Time: 09/13/15  6:27 PM  Result Value Ref Range Status   Specimen Description SPUTUM  Final   Special Requests NONE  Final   Sputum evaluation  Final    THIS SPECIMEN IS ACCEPTABLE. RESPIRATORY CULTURE REPORT TO FOLLOW.   Report Status 09/13/2015 FINAL  Final  Culture, respiratory (NON-Expectorated)     Status: None   Collection Time: 09/13/15  6:30 PM  Result Value Ref Range Status   Specimen Description SPUTUM  Final   Special Requests NONE  Final   Gram Stain   Final    FEW WBC PRESENT,BOTH PMN AND MONONUCLEAR NO ORGANISMS SEEN FEW GRAM POSITIVE COCCI IN CLUSTERS IN PAIRS Performed at [facility]    Culture   Final    NORMAL OROPHARYNGEAL FLORA Performed at [facility]    Report Status 09/16/2015 FINAL  Final         Radiology Studies: No results found.      Scheduled Meds: . antiseptic oral rinse  7 mL Mouth Rinse BID  . aspirin EC  81 mg Oral Daily  . carvedilol  6.25 mg Oral BID WC  . enoxaparin (LOVENOX) injection  40 mg Subcutaneous Q24H  . folic acid  1 mg Oral Daily  . furosemide  40 mg Intravenous Daily  . guaiFENesin  600 mg Oral BID  . insulin aspart  0-15 Units Subcutaneous TID WC  . insulin aspart  0-5 Units Subcutaneous QHS  . insulin aspart  3 Units Subcutaneous TID WC  . insulin glargine  15 Units Subcutaneous QHS  . lisinopril  5 mg Oral Daily  . multivitamin with minerals  1 tablet Oral Daily  . pravastatin  10 mg Oral q1800  . senna-docusate  1 tablet Oral BID  . sodium chloride flush  3 mL Intravenous Q12H  . thiamine   100 mg Oral Daily  . traZODone  100 mg Oral QHS   Continuous Infusions:    LOS: 9 days    Time spent: 15 minutes.     Jeralyn Bennett, MD [facility] Pager [phone]  If 7PM-7AM, please contact night-coverage [email or web address] Password [email or web address] 09/16/2015, 1:36 PM

## 2015-09-16 NOTE — Progress Notes (Signed)
Physical Therapy Treatment Patient Details Name: [name] MRN: [id number] DOB: [birth year] Today's Date: Sep 18, 2015    History of Present Illness 60 yo male admitted with heart failure. Hx of ETOH abuse, DM, HTN, HF, CAD, drug use, MI, mental d/o, CHF    PT Comments    Pt doing very well today; amb 1200' I'ly--- goals met,  Will D/C PT  Follow Up Recommendations  No PT follow up     Equipment Recommendations  None recommended by PT    Recommendations for Other Services       Precautions / Restrictions Precautions Precautions: Fall    Mobility  Bed Mobility Overal bed mobility: Modified Independent                Transfers Overall transfer level: Independent Equipment used: None                Ambulation/Gait Ambulation/Gait assistance: Supervision;Independent Ambulation Distance (Feet): 1200 Feet Assistive device: None Gait Pattern/deviations: WFL(Within Functional Limits)     General Gait Details: initial close supervision for safety--pt progressed to I with gait during session;  O2 sats 95% on RA during and after amb   Stairs            Wheelchair Mobility    Modified Rankin (Stroke Patients Only)       Balance             Standing balance-Leahy Scale: Normal               High level balance activites: Backward walking;Direction changes;Turns;Sudden stops;Head turns High Level Balance Comments: no LOB with above or mod/max perturbations; pt also able to lift 3in1 out of shower into bathroom    Cognition Arousal/Alertness: Awake/alert Behavior During Therapy: WFL for tasks assessed/performed Overall Cognitive Status: Within Functional Limits for tasks assessed                      Exercises      General Comments        Pertinent Vitals/Pain Pain Assessment: No/denies pain    Home Living                      Prior Function            PT Goals (current goals can now be found in the  care plan section) Acute Rehab PT Goals Patient Stated Goal: to possibly go to rehab (detox) PT Goal Formulation: All assessment and education complete, DC therapy Time For Goal Achievement: 09/27/15 Potential to Achieve Goals: Good Progress towards PT goals: Progressing toward goals    Frequency  Min 3X/week    PT Plan Current plan remains appropriate;Discharge plan needs to be updated    Co-evaluation             End of Session Equipment Utilized During Treatment: Gait belt Activity Tolerance: Patient tolerated treatment well Patient left: Other (comment) (in bathroom--tech notified)     Time: 4403-4742 PT Time Calculation (min) (ACUTE ONLY): 23 min  Charges:  $Gait Training: 23-37 mins                    G Codes:      [name] 2015-09-18, 11:40 AM

## 2015-09-16 NOTE — Progress Notes (Signed)
LCSW spoke at length with patient and sister regarding disposition. Patient is wanting to be placed into a supportive housing for SA. Discussed options of [employer], [employer] and [employer] [facility].  Patient refused [facility] and [employer] [facility] as the fee and work are not what he wants to do.  He has been to [facility] in past. Patient has completed [facility] and hoping to get into [place] [facility] program in [name].  [place] information given to patient and sister regarding [facility] for transitional living. Patient does not qualify for detox services as he is detoxed Residential Services are difficult to place due to most providers not being in network with [facility] Medicare. Outpatient options also given and resource list.  Sister reports he cannot go back to his previous dwelling with friends due to at risk use of substances. LCSW discussed option of patient going home with family/sister.  This is TBD per sister.  Will follow up  Deretha Emory, MSW Clinical Social Work: [employer] [phone]

## 2015-09-16 NOTE — Progress Notes (Signed)
Patient given discharge, follow up, and medication instructions, verbalized understanding, IV and telemetry removed, patient's mother called family for transport home.

## 2015-09-16 NOTE — Progress Notes (Signed)
Patient Name: [name] Date of Encounter: 09/16/2015  Principal Problem:   Acute on chronic combined systolic (congestive) and diastolic (congestive) heart failure (HCC) Active Problems:   Diabetes mellitus, type II (HCC)   Hypertension   Cardiomyopathy, nonischemic (HCC)   ETOH abuse   Alcohol dependence with withdrawal delirium (HCC)   CHF (congestive heart failure) (HCC)   Pleural effusion on right   Insulin dependent diabetes mellitus (HCC)   Hepatitis, viral   DNR (do not resuscitate) discussion   Palliative care encounter   Polysubstance abuse   Pleural effusion   Primary Cardiologist: Dr Shirlee Latch, Dr Clifton James  Patient Profile: 60 y.o. male with a history of NICM/chronic combined S/D CHF (EF 30-35%/G3DD), HTN, IDDM, CAD and ongoing alcohol and cocaine abuse who presented to [facility] on 09/07/15 with SOB while intoxicated.   SUBJECTIVE: Sleepy but resting comfortably, no SOB, chest pain or palpitations  OBJECTIVE Filed Vitals:   09/15/15 1440 09/15/15 1706 09/15/15 2119 09/16/15 0447  BP: 107/79 113/83 111/78 110/82  Pulse: 74 74 70 73  Temp: 97.7 F (36.5 C)  97.8 F (36.6 C) 97.8 F (36.6 C)  TempSrc:   Oral Oral  Resp: Height:      Weight:    163 lb 5.8 oz (74.1 kg)  SpO2: 98%  97% 97%    Intake/Output Summary (Last 24 hours) at 09/16/15 0742 Last data filed at 09/16/15 0526  Gross per 24 hour  Intake    123 ml  Output    625 ml  Net   -502 ml   Filed Weights   09/14/15 0549 09/15/15 0635 09/16/15 0447  Weight: 159 lb 12.8 oz (72.485 kg) 164 lb 6.4 oz (74.571 kg) 163 lb 5.8 oz (74.1 kg)    PHYSICAL EXAM General: Well developed, well nourished, male in no acute distress. Head: Normocephalic, atraumatic.  Neck: Supple without bruits, JVD elevated. Lungs:  Resp regular and unlabored, few rales bases. Heart: RRR, S1, S2, no S3, S4, or murmur; no rub. Abdomen: Soft, non-tender, non-distended, BS + x 4.  Extremities: No clubbing,  cyanosis, edema.  Neuro: Alert and oriented X 3. Moves all extremities spontaneously. Psych: Normal affect.  LABS: CBC: Recent Labs  09/14/15 0518  WBC 4.8  HGB 13.4  HCT 39.4  MCV 87.4  PLT 216   Basic Metabolic Panel: Recent Labs  09/14/15 0518 09/16/15 0535  NA 134* 138  K 4.4 4.5  CL 105 106  CO2 23 26  GLUCOSE 251* 142*  BUN 19 18  CREATININE 1.12 1.22  CALCIUM 8.1* 8.4*  MG 1.9 1.9   Liver Function Tests: Recent Labs  09/14/15 0518  AST 38  ALT 32  ALKPHOS 81  BILITOT 0.4  PROT 5.9*  ALBUMIN 2.5*   BNP:  B NATRIURETIC PEPTIDE  Date/Time Value Ref Range Status  09/14/2015 05:18 AM 862.7* 0.0 - 100.0 pg/mL Final  09/12/2015 05:16 AM 753.4* 0.0 - 100.0 pg/mL Final    TELE: SR, PVCs and pairs, 3 bt run NSVT       Current Medications:  . antiseptic oral rinse  7 mL Mouth Rinse BID  . aspirin EC  81 mg Oral Daily  . carvedilol  3.125 mg Oral BID WC  . enoxaparin (LOVENOX) injection  40 mg Subcutaneous Q24H  . folic acid  1 mg Oral Daily  . furosemide  40 mg Intravenous Daily  . guaiFENesin  600 mg Oral BID  .  insulin aspart  0-15 Units Subcutaneous TID WC  . insulin aspart  0-5 Units Subcutaneous QHS  . insulin aspart  3 Units Subcutaneous TID WC  . insulin glargine  15 Units Subcutaneous QHS  . lisinopril  5 mg Oral Daily  . magnesium oxide  400 mg Oral Daily  . multivitamin with minerals  1 tablet Oral Daily  . pravastatin  10 mg Oral q1800  . senna-docusate  1 tablet Oral BID  . sodium chloride flush  3 mL Intravenous Q12H  . thiamine  100 mg Oral Daily  . traZODone  100 mg Oral QHS      ASSESSMENT AND PLAN: Acute on chronic combined S/D CHF:  reapeat 2D ECHO with mildly worsening EF to 25-30%, mod MR, mild LAE, mild-mod RV dysfunction, PA pressure 40, trivial pericardial effusion with left pleural effusion.  -- BNP of 1076(5/15), 753(5/20), 862 (05/22),  chest x-ray 5/22: Increasing size of right pleural effusion post  thoracentesis, small to moderate in size. Unchanged small to moderate left pleural effusion. Associated compressive atelectasis. S/P thoracentesis 5/19 with 1.8 Liters removed.  --Net fluids: -6.5Lsince admi -- restarted on IV Lasix 40 mg qd 05/22 --repeat CXR in a week to follow effusion, may need thoracentesis again -- on Coreg 3.125mg  BID, lisinopril discontinued due to AKI(resolved) and soft BPs. Creat is stable with diuresis. Would prefer Coreg over lisinopril. Think BP will tolerate higher dose Coreg (which may decrease ectopy), will increase to 6.25 mg bid  Elevated troponin: 0.08--> 0.06. Low and flat c/w demand ischemia. No plans for cath   HTN: BPs were soft. Lisinopril and imdur stopped, per IM  Type 2 diabetes mellitus: metformin held, HgA1c 11.7, per IM  History of coronary artery disease: non obst on cath in 2014.   Polysubstance abuse: UDS + cocaine, benzos and was intoxicated on alcohol on admission. On CIWA protocol. Long term prognosis very poor if he cannot quit drinking and using drugs, per IM>>medical rehab admit, then drug rehab  Non compliance: has not followed up as an outpatient in the past and continued to abuse drugs and alcohol. He is not asking for rehab   Seen by Palliative Care and is partial code with meds only.    Melida Quitter , PA-C 7:42 AM 09/16/2015

## 2015-09-16 NOTE — Care Management Important Message (Signed)
Important Message  Patient Details  Name: [name] MRN: [id number] Date of Birth: 06-Sep-1955   Medicare Important Message Given:  Yes    Haskell Flirt 09/16/2015, 10:19 AMImportant Message  Patient Details  Name: [name] MRN: [id number] Date of Birth: 12-04-1955   Medicare Important Message Given:  Yes    Haskell Flirt 09/16/2015, 10:19 AM

## 2015-09-16 NOTE — Care Management Note (Signed)
Case Management Note  Patient Details  Name: [name] MRN: [id number] Date of Birth: 1955/06/20  Subjective/Objective:  Medically stable for d/c, d/c summary done. Waiting on [place] for SNF-CSW following.                  Action/Plan:d/c SNF.   Expected Discharge Date:   (UNKNOWN)               Expected Discharge Plan:  Skilled Nursing Facility  In-House Referral:  Clinical Social Work  Discharge planning Services  CM Consult  Post Acute Care Choice:    Choice offered to:     DME Arranged:    DME Agency:     HH Arranged:    HH Agency:     Status of Service:  Completed, signed off  Medicare Important Message Given:  Yes Date Medicare IM Given:    Medicare IM give by:    Date Additional Medicare IM Given:    Additional Medicare Important Message give by:     If discussed at Long Length of Stay Meetings, dates discussed:    Additional Comments:  Lanier Clam, RN 09/16/2015, 10:44 AM

## 2015-09-24 ENCOUNTER — Emergency Department (HOSPITAL_COMMUNITY)
Admission: EM | Admit: 2015-09-24 | Discharge: 2015-09-25 | Disposition: A | Discharge status: Home / Self Care | Payer: Medicare Other | Attending: Emergency Medicine | Admitting: Emergency Medicine

## 2015-09-24 ENCOUNTER — Emergency Department (HOSPITAL_COMMUNITY): Payer: Medicare Other

## 2015-09-24 ENCOUNTER — Encounter (HOSPITAL_COMMUNITY): Payer: Self-pay | Admitting: Emergency Medicine

## 2015-09-24 DIAGNOSIS — Z7982 Long term (current) use of aspirin: Secondary | ICD-10-CM | POA: Insufficient documentation

## 2015-09-24 DIAGNOSIS — Z794 Long term (current) use of insulin: Secondary | ICD-10-CM | POA: Diagnosis not present

## 2015-09-24 DIAGNOSIS — R0602 Shortness of breath: Secondary | ICD-10-CM | POA: Insufficient documentation

## 2015-09-24 DIAGNOSIS — Z79899 Other long term (current) drug therapy: Secondary | ICD-10-CM | POA: Insufficient documentation

## 2015-09-24 DIAGNOSIS — Z951 Presence of aortocoronary bypass graft: Secondary | ICD-10-CM | POA: Diagnosis not present

## 2015-09-24 DIAGNOSIS — I2510 Atherosclerotic heart disease of native coronary artery without angina pectoris: Secondary | ICD-10-CM | POA: Insufficient documentation

## 2015-09-24 DIAGNOSIS — I509 Heart failure, unspecified: Secondary | ICD-10-CM | POA: Insufficient documentation

## 2015-09-24 DIAGNOSIS — I110 Hypertensive heart disease with heart failure: Secondary | ICD-10-CM | POA: Insufficient documentation

## 2015-09-24 DIAGNOSIS — E119 Type 2 diabetes mellitus without complications: Secondary | ICD-10-CM | POA: Diagnosis not present

## 2015-09-24 DIAGNOSIS — Z7984 Long term (current) use of oral hypoglycemic drugs: Secondary | ICD-10-CM | POA: Diagnosis not present

## 2015-09-24 DIAGNOSIS — F17210 Nicotine dependence, cigarettes, uncomplicated: Secondary | ICD-10-CM | POA: Insufficient documentation

## 2015-09-24 DIAGNOSIS — F329 Major depressive disorder, single episode, unspecified: Secondary | ICD-10-CM | POA: Insufficient documentation

## 2015-09-24 HISTORY — DX: Heart failure, unspecified: I50.9

## 2015-09-24 NOTE — ED Notes (Signed)
Patient ambulated in highway and 02 sats were 97%.

## 2015-09-24 NOTE — ED Provider Notes (Signed)
History  By signing my name below, I, [name], attest that this documentation has been prepared under the direction and in the presence of [employer], PA-C. Electronically Signed: Earmon [name], ED Scribe. 09/24/2015. 11:20 PM.  Chief Complaint  Patient presents with  . Shortness of Breath   The history is provided by the patient and medical records. No language interpreter was used.    HPI Comments:  [name] is a 60 y.o. male with PMHx of HTN, DM and CHF who presents to the Emergency Department complaining of SOB that began earlier today. He reports when he gets anxious he experiences this SOB. Pt reports orthopnea. He was seen here about two weeks ago in the ED 09/07/15. He went to the [facility] hospital in [place] a couple days afterwards and was just discharged earlier today. He reports his weight at discharge was 163 pounds. He reports being given Trazodone while in the hospital but was not given a prescription for this when he was discharged. Pt states he has not slept in two days. His wife states he was told that he had two months to live and since then he gets anxious anytime he is about to go to sleep. He has taken Hydroxyzine (2 tablets in 3.5 hours) to treat his symptoms with no significant relief. Lying flat increases his SOB. He denies alleviating factors. He denies fever, chills, nausea, vomiting, CP. He denies any current SOB.    Past Medical History  Diagnosis Date  . Diabetes mellitus   . Hypertension   . Coronary artery disease   . MI (myocardial infarction) (HCC) 37    age 26 per patient  . Mental disorder   . Depression   . CHF (congestive heart failure) [facility])    Past Surgical History  Procedure Laterality Date  . Angioplasty  1984    at age 26  . Left and right heart catheterization with coronary/graft angiogram  08/13/2012    Procedure: LEFT AND RIGHT HEART CATHETERIZATION WITH [name];  Surgeon: Kathleene Hazel, MD;   Location: [facility] CATH LAB;  Service: Cardiovascular;;   Family History  Problem Relation Age of Onset  . Heart attack Father     >60s  . Hypertension Father   . Diabetes Father   . Heart attack Sister   . Hypertension Mother    Social History  Substance Use Topics  . Smoking status: Current Every Day Smoker -- 0.50 packs/day    Types: Cigarettes  . Smokeless tobacco: Never Used  . Alcohol Use: 1.2 oz/week    2 Cans of beer per week     Comment: drinking daily (2 40oz beer a day)    Review of Systems  Respiratory: Positive for shortness of breath.   Psychiatric/Behavioral: Positive for sleep disturbance. The patient is nervous/anxious.   All other systems reviewed and are negative.   Allergies  Review of patient's allergies indicates no known allergies.  Home Medications   Prior to Admission medications   Medication Sig Start Date End Date Taking? Authorizing Provider  aspirin 81 MG tablet Take 81 mg by mouth daily.   Yes Historical Provider, MD  buPROPion (WELLBUTRIN) 100 MG tablet Take 100 mg by mouth daily.   Yes Historical Provider, MD  carvedilol (COREG) 12.5 MG tablet Take 6.25 mg by mouth 2 (two) times daily with a meal.   Yes Historical Provider, MD  feeding supplement, GLUCERNA SHAKE, (GLUCERNA SHAKE) LIQD Take 237 mLs by mouth 3 (three) times daily  between meals. 02/27/14  Yes Christina P Rama, MD  furosemide (LASIX) 40 MG tablet Take 1 tablet (40 mg total) by mouth daily. 09/17/15  Yes Jeralyn Bennett, MD  hydrOXYzine (ATARAX/VISTARIL) 25 MG tablet Take 25 mg by mouth 3 (three) times daily as needed for anxiety or itching.   Yes Historical Provider, MD  Insulin Glargine (LANTUS SOLOSTAR) 100 UNIT/ML Solostar Pen Inject 20 Units into the skin every morning. 09/16/15  Yes Jeralyn Bennett, MD  lisinopril (PRINIVIL,ZESTRIL) 2.5 MG tablet Take 2.5 mg by mouth daily.   Yes Historical Provider, MD  magnesium oxide (MAG-OX) 400 (241.3 Mg) MG tablet Take 1 tablet (400 mg total) by  mouth daily. 09/15/15  Yes Albertine Grates, MD  metFORMIN (GLUCOPHAGE) 1000 MG tablet Take 0.5 tablets (500 mg total) by mouth 2 (two) times daily with a meal. For high blood sugar control Patient taking differently: Take 1,000 mg by mouth 2 (two) times daily with a meal. For high blood sugar control 12/27/12  Yes Sanjuana Kava, NP  Multiple Vitamin (MULTIVITAMIN WITH MINERALS) TABS tablet Take 1 tablet by mouth daily. 02/27/14  Yes Christina P Rama, MD  potassium chloride (MICRO-K) 10 MEQ CR capsule Take 10 mEq by mouth daily.   Yes Historical Provider, MD  pravastatin (PRAVACHOL) 10 MG tablet Take 1 tablet (10 mg total) by mouth daily. For high cholesterol control 09/16/15  Yes Jeralyn Bennett, MD  sertraline (ZOLOFT) 100 MG tablet Take 100 mg by mouth daily.   Yes Historical Provider, MD  traZODone (DESYREL) 100 MG tablet Take 1 tablet (100 mg total) by mouth at bedtime. For sleep 12/27/12  Yes Sanjuana Kava, NP  carvedilol (COREG) 6.25 MG tablet Take 1 tablet (6.25 mg total) by mouth 2 (two) times daily with a meal. Patient not taking: Reported on 09/24/2015 09/16/15   Jeralyn Bennett, MD  folic acid (FOLVITE) 1 MG tablet Take 1 tablet (1 mg total) by mouth daily. Patient not taking: Reported on 09/24/2015 02/27/14   Maryruth Bun Rama, MD  guaiFENesin (MUCINEX) 600 MG 12 hr tablet Take 1 tablet (600 mg total) by mouth 2 (two) times daily. Patient not taking: Reported on 09/24/2015 09/15/15   Albertine Grates, MD  insulin aspart (NOVOLOG) 100 UNIT/ML injection Inject 3 Units into the skin 3 (three) times daily with meals. 09/15/15   Albertine Grates, MD  insulin aspart (NOVOLOG) 100 UNIT/ML injection Inject 0-15 Units into the skin 3 (three) times daily with meals. Before each meal 3 times a day, 140-199 - 2 units, 200-250 - 4 units, 251-299 - 6 units,  300-349 - 8 units,  350 or above 10 units. Insulin PEN if approved, provide syringes and needles if needed. 09/15/15   Albertine Grates, MD  lisinopril (PRINIVIL,ZESTRIL) 5 MG tablet Take 1 tablet (5  mg total) by mouth daily. Patient not taking: Reported on 09/24/2015 09/16/15   Jeralyn Bennett, MD  Potassium Chloride ER 20 MEQ TBCR Take 20 mEq by mouth every Monday, Wednesday, and Friday. For low potassium supplement Patient not taking: Reported on 09/24/2015 09/16/15   Jeralyn Bennett, MD  senna-docusate (SENOKOT-S) 8.6-50 MG tablet Take 1 tablet by mouth at bedtime as needed for mild constipation. 09/15/15   Albertine Grates, MD  thiamine 100 MG tablet Take 1 tablet (100 mg total) by mouth daily. Patient not taking: Reported on 09/08/2015 02/27/14   Maryruth Bun Rama, MD   Triage Vitals: BP 111/80 mmHg  Pulse 71  Temp(Src) 97.7 F (36.5 C) (Oral)  Resp  15  Ht  (1.88 m)  Wt 163 lb (73.936 kg)  BMI 20.92 kg/m2  SpO2 99%  Physical Exam  Constitutional: He is oriented to person, place, and time. He appears well-developed and well-nourished. No distress.  Nontoxic appearing and in no distress  HENT:  Head: Normocephalic and atraumatic.  Eyes: Conjunctivae and EOM are normal. No scleral icterus.  Neck: Normal range of motion.  Cardiovascular: Normal rate, regular rhythm and intact distal pulses.   Pulmonary/Chest: Effort normal and breath sounds normal. No respiratory distress. He has no wheezes. He has no rales.  Good lung sounds diffusely. Chest expansion symmetric. No tachypnea or dyspnea.  Musculoskeletal: Normal range of motion.  No peripheral edema.  Neurological: He is alert and oriented to person, place, and time. He exhibits normal muscle tone. Coordination normal.  GCS 15. Patient moving all extremities.  Skin: Skin is warm and dry. No rash noted. He is not diaphoretic. No erythema. No pallor.  Psychiatric: He has a normal mood and affect. His behavior is normal.  Nursing note and vitals reviewed.   ED Course  Procedures (including critical care time) DIAGNOSTIC STUDIES: Oxygen Saturation is 99% on RA, normal by my interpretation.   COORDINATION OF CARE: 10:24 PM- Will speak  with Dr. Freida Busman about appropriate plan of care. Pt verbalizes understanding and agrees to plan.  Medications - No data to display  Labs Review Labs Reviewed - No data to display  Imaging Review Dg Chest 2 View  09/24/2015  CLINICAL DATA:  Shortness of breath. Patient reports hospital discharge earlier this day for CHF. EXAM: CHEST  2 VIEW COMPARISON:  Radiographs 09/13/2015 FINDINGS: Small bilateral pleural effusions and adjacent compressive atelectasis, diminished from prior radiograph. Decreased pulmonary edema. Cardiomegaly is grossly stable. Upper lungs are clear. No new focal opacity is seen. No pneumothorax. IMPRESSION: Improved CHF with small residual pleural effusions. Electronically Signed   By: Rubye Oaks M.D.   On: 09/24/2015 21:08   I have personally reviewed and evaluated these images and lab results as part of my medical decision-making.   EKG Interpretation None      MDM   Final diagnoses:  Shortness of breath    Patient presents to the emergency department for complaints of shortness of breath. He was discharged from the [facility] today after admission for CHF and diuresis. Patient with no hypoxia in the emergency department. He is able to ambulate today with oxygen saturations of 97% on room air. Chest x-ray shows improving CHF compared to imaging on 09/13/2015. Lung sounds are grossly clear. No signs of infection; no fever.  Patient was recently started on Vistaril for presumed anxiety. It is possible that his shortness of breath may be due to worsening anxiety. He has noted improvement in his shortness of breath since taking 2 of these tablets prior to arrival. No signs of acute CHF exacerbation today. Patient has no peripheral edema. I do not believe further emergent workup is indicated currently. I have advised the patient to follow-up with his primary care doctor. He is stable for discharge. Return precautions given at discharge. Patient agreeable to plan with no  unaddressed concerns.  I personally performed the services described in this documentation, which was scribed in my presence. The recorded information has been reviewed and is accurate.       Antony Madura, PA-C 09/25/15 [phone]

## 2015-09-24 NOTE — ED Notes (Signed)
Pt states he is having shortness of breath esp when he lays down  Pt states he was just discharged from the [place] today with CHF  Pt states he has a hard time sleeping cause he cant breathe  Pt states he may just be anxious

## 2015-09-24 NOTE — ED Provider Notes (Signed)
Medical screening examination/treatment/procedure(s) were conducted as a shared visit with non-physician practitioner(s) and myself.  I personally evaluated the patient during the encounter.   EKG Interpretation   Date/Time:  Thursday September 24 2015 41:58:30 EDT Ventricular Rate:  66 PR Interval:  166 QRS Duration: 101 QT Interval:  416 QTC Calculation: 436 R Axis:   75 Text Interpretation:  Sinus rhythm Atrial premature complex Probable left  atrial enlargement Nonspecific T abnormalities, lateral leads Confirmed by  Aslan Montagna  MD, [place] ([id number]) on 09/24/2015 11:37:59 PM     Patient here complaining of increased anxiety and dyspnea. Recently discharged from the [place] today after being treated for CHF. Took his Vistaril at home and now feels much better. Chest x-ray shows improving CHF. EKG without acute changes. Patient stable for discharge  Lorre Nick, MD 09/24/15 [id number]

## 2015-09-25 NOTE — Discharge Instructions (Signed)

## 2015-12-17 ENCOUNTER — Encounter (HOSPITAL_COMMUNITY)
Admission: RE | Admit: 2015-12-17 | Discharge: 2015-12-17 | Disposition: A | Discharge status: Home / Self Care | Payer: Medicare Other | Source: Ambulatory Visit | Attending: Cardiology | Admitting: Cardiology

## 2015-12-17 VITALS — BP 104/67 | HR 60 | Ht 75.0 in | Wt 162.7 lb

## 2015-12-17 DIAGNOSIS — I5022 Chronic systolic (congestive) heart failure: Secondary | ICD-10-CM

## 2015-12-17 NOTE — Progress Notes (Signed)
Cardiac Rehab Medication Review by a Pharmacist  Does the patient  feel that his/her medications are working for him/her?  yes  Has the patient been experiencing any side effects to the medications prescribed?  yes  Does the patient measure his/her own blood pressure or blood glucose at home?  yes   Does the patient have any problems obtaining medications due to transportation or finances?   no  Understanding of regimen: fair Understanding of indications: good Potential of compliance: good  Pharmacist comments: 60 yo male presents ambulating without assistance for his appointment. He brings a list of medications from the [facility] [place] so we can update his list here. We spoke at length about his carvedilol dosing which patient takes 5 - 3.125 mg tablets twice daily plus 6.25 mg twice daily. He has had a 12.5 mg tablet listed on his profile which I discontinued as he states the "large" tablet is 6.25mg . He states he understands how he is supposed to take these, but that it could get confusing. I recommended speaking with his provider to try and consolidate this dose to one tablet twice daily. He reports checking his sugars and blood pressure at home. He treats his blood sugars with Lantus prn and may benefit from further teaching on this. He does report some lightheadedness in the morning that goes away and I recommended checking blood pressure for this. Time allotted for additional questions and discussion. No further concerns or question from patient at this time.   York Cerise, PharmD Pharmacy Resident  Pager [phone] 12/17/15 9:49 AM

## 2015-12-18 ENCOUNTER — Encounter (HOSPITAL_COMMUNITY): Payer: Self-pay

## 2015-12-18 NOTE — Progress Notes (Signed)
Cardiac Individual Treatment Plan  Patient Details  Name: [name] MRN: [id number] Date of Birth: [birth year] Referring Provider:   Flowsheet Row CARDIAC REHAB PHASE II ORIENTATION from 12/17/2015 in [facility] CARDIAC [facility]  Referring Provider  Lyndle Herrlich MD [place] coverage)      Initial Encounter Date:  Flowsheet Row CARDIAC REHAB PHASE II ORIENTATION from 12/17/2015 in [facility] CARDIAC REHAB  Date  12/17/15  Referring Provider  Lyndle Herrlich MD ([place] coverage)      Visit Diagnosis: 09/18/2015 Heart failure, chronic systolic (HCC)  Patient's Home Medications on Admission:  Current Outpatient Prescriptions:  .  aspirin 81 MG tablet, Take 81 mg by mouth daily., Disp: , Rfl:  .  buPROPion (WELLBUTRIN) 100 MG tablet, Take 100 mg by mouth at bedtime. , Disp: , Rfl:  .  carvedilol (COREG) 3.125 MG tablet, Take 15.625 mg by mouth 2 (two) times daily with a meal., Disp: , Rfl:  .  carvedilol (COREG) 6.25 MG tablet, Take 1 tablet (6.25 mg total) by mouth 2 (two) times daily with a meal., Disp: 60 tablet, Rfl: 1 .  folic acid (FOLVITE) 1 MG tablet, Take 1 tablet (1 mg total) by mouth daily., Disp: , Rfl:  .  furosemide (LASIX) 40 MG tablet, Take 1 tablet (40 mg total) by mouth daily., Disp: 30 tablet, Rfl: 2 .  guaiFENesin (MUCINEX) 600 MG 12 hr tablet, Take 1 tablet (600 mg total) by mouth 2 (two) times daily., Disp: 30 tablet, Rfl: 0 .  hydrOXYzine (ATARAX/VISTARIL) 25 MG tablet, Take 25 mg by mouth 3 (three) times daily as needed for anxiety or itching., Disp: , Rfl:  .  Insulin Glargine (LANTUS SOLOSTAR) 100 UNIT/ML Solostar Pen, Inject 20 Units into the skin every morning., Disp: 15 mL, Rfl: 0 .  lisinopril (PRINIVIL,ZESTRIL) 5 MG tablet, Take 1 tablet (5 mg total) by mouth daily. (Patient taking differently: Take 10 mg by mouth daily. ), Disp: 30 tablet, Rfl: 0 .  magnesium oxide (MAG-OX) 400 (241.3 Mg) MG tablet, Take 1 tablet (400 mg  total) by mouth daily., Disp: 30 tablet, Rfl: 0 .  metFORMIN (GLUCOPHAGE) 1000 MG tablet, Take 0.5 tablets (500 mg total) by mouth 2 (two) times daily with a meal. For high blood sugar control (Patient taking differently: Take 1,000 mg by mouth 2 (two) times daily with a meal. For high blood sugar control), Disp: , Rfl:  .  Multiple Vitamin (MULTIVITAMIN WITH MINERALS) TABS tablet, Take 1 tablet by mouth daily., Disp: , Rfl:  .  pravastatin (PRAVACHOL) 10 MG tablet, Take 1 tablet (10 mg total) by mouth daily. For high cholesterol control, Disp: 30 tablet, Rfl: 0 .  sertraline (ZOLOFT) 100 MG tablet, Take 100 mg by mouth daily., Disp: , Rfl:  .  spironolactone (ALDACTONE) 25 MG tablet, Take 25 mg by mouth daily., Disp: , Rfl:  .  thiamine 100 MG tablet, Take 1 tablet (100 mg total) by mouth daily., Disp: , Rfl:  .  traZODone (DESYREL) 100 MG tablet, Take 1 tablet (100 mg total) by mouth at bedtime. For sleep (Patient taking differently: Take 150 mg by mouth at bedtime. For sleep), Disp: 30 tablet, Rfl: 0 .  feeding supplement, GLUCERNA SHAKE, (GLUCERNA SHAKE) LIQD, Take 237 mLs by mouth 3 (three) times daily between meals. (Patient not taking: Reported on 12/17/2015), Disp: , Rfl: 0  Past Medical History: Past Medical History:  Diagnosis Date  . CHF (congestive heart failure) (HCC)   . Coronary  artery disease   . Depression   . Diabetes mellitus   . Hypertension   . Mental disorder   . MI (myocardial infarction) (HCC) 67   age 35 per patient    Tobacco Use: History  Smoking Status  . Current Every Day Smoker  . Packs/day: 0.50  . Types: Cigarettes  Smokeless Tobacco  . Never Used    Labs: Recent Review Flowsheet Data    Labs for ITP Cardiac and Pulmonary Rehab Latest Ref Rng & Units 02/20/2014 02/20/2014 02/20/2014 03/10/2015 09/10/2015   Cholestrol 0 - 200 mg/dL - - - - -   LDLCALC 0 - 99 mg/dL - - - - -   HDL >16 mg/dL - - - - -   Trlycerides <150 mg/dL - - - - -   Hemoglobin  A1c 4.8 - 5.6 % - - 7.3(H) 10.5(H) 11.7(H)   PHART 7.350 - 7.450 - 7.479(H) - - -   PCO2ART 35.0 - 45.0 mmHg - 27.1(L) - - -   HCO3 20.0 - 24.0 mEq/L 21.8 19.9(L) - - -   TCO2 0 - 100 mmol/L 20.1 17.6 - - -   ACIDBASEDEF 0.0 - 2.0 mmol/L 1.7 2.1(H) - - -   O2SAT % 35.8 89.7 - - -      Capillary Blood Glucose: Lab Results  Component Value Date   GLUCAP 185 (H) 09/16/2015   GLUCAP 287 (H) 09/16/2015   GLUCAP 185 (H) 09/16/2015   GLUCAP 212 (H) 09/15/2015   GLUCAP 249 (H) 09/15/2015     Exercise Target Goals: Date: 12/17/15  Exercise Program Goal: Individual exercise prescription set with THRR, safety & activity barriers. Participant demonstrates ability to understand and report RPE using BORG scale, to self-measure pulse accurately, and to acknowledge the importance of the exercise prescription.  Exercise Prescription Goal: Starting with aerobic activity 30 plus minutes a day, 3 days per week for initial exercise prescription. Provide home exercise prescription and guidelines that participant acknowledges understanding prior to discharge.  Activity Barriers & Risk Stratification:     Activity Barriers & Cardiac Risk Stratification - 12/17/15 0853      Activity Barriers & Cardiac Risk Stratification   Activity Barriers None   Cardiac Risk Stratification High      6 Minute Walk:     6 Minute Walk    Row Name 12/17/15 1428         6 Minute Walk   Phase Initial     Distance 1947 feet     Walk Time 6 minutes     # of Rest Breaks 0     MPH 3.7     METS 4.9     RPE 10     Perceived Dyspnea  2     VO2 Peak 17.3     Symptoms Yes (comment)     Comments SOB with walk test. Dyspnea of  2      Resting HR 60 bpm     Resting BP 104/67     Max Ex. HR 102 bpm     Max Ex. BP 100/60     2 Minute Post BP 106/74        Initial Exercise Prescription:     Initial Exercise Prescription - 12/17/15 1400      Date of Initial Exercise RX and Referring Provider   Date  12/17/15   Referring Provider Lyndle Herrlich MD ([place] coverage)     Treadmill   MPH 3   Grade 1  Minutes 10   METs 3.71     Bike   Level 1   Minutes 10   METs 3.6     NuStep   Level 3   Minutes 10   METs 2.4     Prescription Details   Frequency (times per week) 3   Duration Progress to 30 minutes of continuous aerobic without signs/symptoms of physical distress     Intensity   THRR 40-80% of Max Heartrate 64-127   Ratings of Perceived Exertion 11-13   Perceived Dyspnea 0-4     Progression   Progression Continue to progress workloads to maintain intensity without signs/symptoms of physical distress.     Resistance Training   Training Prescription Yes   Weight 2   Reps 10-12      Perform Capillary Blood Glucose checks as needed.  Exercise Prescription Changes:   Exercise Comments:   Discharge Exercise Prescription (Final Exercise Prescription Changes):   Nutrition:  Target Goals: Understanding of nutrition guidelines, daily intake of sodium 1500mg , cholesterol 200mg , calories 30% from fat and 7% or less from saturated fats, daily to have 5 or more servings of fruits and vegetables.  Biometrics:     Pre Biometrics - 12/17/15 1433      Pre Biometrics   Waist Circumference 33 inches   Hip Circumference 36.75 inches   Waist to Hip Ratio 0.9 %   Triceps Skinfold 13 mm   % Body Fat 20.3 %   Grip Strength 32 kg   Flexibility 16 in   Single Leg Stand 4.4 seconds       Nutrition Therapy Plan and Nutrition Goals:   Nutrition Discharge: Nutrition Scores:   Nutrition Goals Re-Evaluation:   Psychosocial: Target Goals: Acknowledge presence or absence of depression, maximize coping skills, provide positive support system. Participant is able to verbalize types and ability to use techniques and skills needed for reducing stress and depression.  Initial Review & Psychosocial Screening:     Initial Psych Review & Screening - 12/18/15 1122       Family Dynamics   Good Support System? Yes      Quality of Life Scores:     Quality of Life - 12/17/15 1434      Quality of Life Scores   Health/Function Pre 15.5 %   Socioeconomic Pre 16.36 %   Psych/Spiritual Pre 15.07 %   Family Pre 14.75 %   GLOBAL Pre 15.5 %      PHQ-9: Recent Review Flowsheet Data    Depression screen PHQ 2/9 08/24/2012   Decreased Interest 0   Down, Depressed, Hopeless 0   PHQ - 2 Score 0      Psychosocial Evaluation and Intervention:   Psychosocial Re-Evaluation:   Vocational Rehabilitation: Provide vocational rehab assistance to qualifying candidates.   Vocational Rehab Evaluation & Intervention:     Vocational Rehab - 12/18/15 1123      Initial Vocational Rehab Evaluation & Intervention   Assessment shows need for Vocational Rehabilitation No  Pt does not plan to return to competive employment.      Education: Education Goals: Education classes will be provided on a weekly basis, covering required topics. Participant will state understanding/return demonstration of topics presented.  Learning Barriers/Preferences:     Learning Barriers/Preferences - 12/17/15 0854      Learning Barriers/Preferences   Learning Barriers Sight   Learning Preferences Skilled Demonstration      Education Topics: Count Your Pulse:  -Group instruction provided by verbal instruction,  demonstration, patient participation and written materials to support subject.  Instructors address importance of being able to find your pulse and how to count your pulse when at home without a heart monitor.  Patients get hands on experience counting their pulse with staff help and individually.   Heart Attack, Angina, and Risk Factor Modification:  -Group instruction provided by verbal instruction, video, and written materials to support subject.  Instructors address signs and symptoms of angina and heart attacks.    Also discuss risk factors for heart disease and how  to make changes to improve heart health risk factors.   Functional Fitness:  -Group instruction provided by verbal instruction, demonstration, patient participation, and written materials to support subject.  Instructors address safety measures for doing things around the house.  Discuss how to get up and down off the floor, how to pick things up properly, how to safely get out of a chair without assistance, and balance training.   Meditation and Mindfulness:  -Group instruction provided by verbal instruction, patient participation, and written materials to support subject.  Instructor addresses importance of mindfulness and meditation practice to help reduce stress and improve awareness.  Instructor also leads participants through a meditation exercise.    Stretching for Flexibility and Mobility:  -Group instruction provided by verbal instruction, patient participation, and written materials to support subject.  Instructors lead participants through series of stretches that are designed to increase flexibility thus improving mobility.  These stretches are additional exercise for major muscle groups that are typically performed during regular warm up and cool down.   Hands Only CPR Anytime:  -Group instruction provided by verbal instruction, video, patient participation and written materials to support subject.  Instructors co-teach with AHA video for hands only CPR.  Participants get hands on experience with mannequins.   Nutrition I class: Heart Healthy Eating:  -Group instruction provided by PowerPoint slides, verbal discussion, and written materials to support subject matter. The instructor gives an explanation and review of the Therapeutic Lifestyle Changes diet recommendations, which includes a discussion on lipid goals, dietary fat, sodium, fiber, plant stanol/sterol esters, sugar, and the components of a well-balanced, healthy diet.   Nutrition II class: Lifestyle Skills:  -Group  instruction provided by PowerPoint slides, verbal discussion, and written materials to support subject matter. The instructor gives an explanation and review of label reading, grocery shopping for heart health, heart healthy recipe modifications, and ways to make healthier choices when eating out.   Diabetes Question & Answer:  -Group instruction provided by PowerPoint slides, verbal discussion, and written materials to support subject matter. The instructor gives an explanation and review of diabetes co-morbidities, pre- and post-prandial blood glucose goals, pre-exercise blood glucose goals, signs, symptoms, and treatment of hypoglycemia and hyperglycemia, and foot care basics.   Diabetes Blitz:  -Group instruction provided by PowerPoint slides, verbal discussion, and written materials to support subject matter. The instructor gives an explanation and review of the physiology behind type 1 and type 2 diabetes, diabetes medications and rational behind using different medications, pre- and post-prandial blood glucose recommendations and Hemoglobin A1c goals, diabetes diet, and exercise including blood glucose guidelines for exercising safely.    Portion Distortion:  -Group instruction provided by PowerPoint slides, verbal discussion, written materials, and food models to support subject matter. The instructor gives an explanation of serving size versus portion size, changes in portions sizes over the last 20 years, and what consists of a serving from each food group.   Stress Management:  -  Group instruction provided by verbal instruction, video, and written materials to support subject matter.  Instructors review role of stress in heart disease and how to cope with stress positively.     Exercising on Your Own:  -Group instruction provided by verbal instruction, power point, and written materials to support subject.  Instructors discuss benefits of exercise, components of exercise, frequency and  intensity of exercise, and end points for exercise.  Also discuss use of nitroglycerin and activating EMS.  Review options of places to exercise outside of rehab.  Review guidelines for sex with heart disease.   Cardiac Drugs I:  -Group instruction provided by verbal instruction and written materials to support subject.  Instructor reviews cardiac drug classes: antiplatelets, anticoagulants, beta blockers, and statins.  Instructor discusses reasons, side effects, and lifestyle considerations for each drug class.   Cardiac Drugs II:  -Group instruction provided by verbal instruction and written materials to support subject.  Instructor reviews cardiac drug classes: angiotensin converting enzyme inhibitors (ACE-I), angiotensin II receptor blockers (ARBs), nitrates, and calcium channel blockers.  Instructor discusses reasons, side effects, and lifestyle considerations for each drug class.   Anatomy and Physiology of the Circulatory System:  -Group instruction provided by verbal instruction, video, and written materials to support subject.  Reviews functional anatomy of heart, how it relates to various diagnoses, and what role the heart plays in the overall system.   Knowledge Questionnaire Score:     Knowledge Questionnaire Score - 12/17/15 1428      Knowledge Questionnaire Score   Pre Score 23/28      Core Components/Risk Factors/Patient Goals at Admission:     Personal Goals and Risk Factors at Admission - 12/17/15 0856      Core Components/Risk Factors/Patient Goals on Admission    Weight Management Weight Gain   Increase Strength and Stamina Yes   Intervention Provide advice, education, support and counseling about physical activity/exercise needs.;Develop an individualized exercise prescription for aerobic and resistive training based on initial evaluation findings, risk stratification, comorbidities and participant's personal goals.   Expected Outcomes Achievement of increased  cardiorespiratory fitness and enhanced flexibility, muscular endurance and strength shown through measurements of functional capacity and personal statement of participant.   Tobacco Cessation Yes   Intervention Assist the participant in steps to quit. Provide individualized education and counseling about committing to Tobacco Cessation, relapse prevention, and pharmacological support that can be provided by physician.;Education officer, environmentalffer self-teaching materials, assist with locating and accessing local/national Quit Smoking programs, and support quit date choice.   Expected Outcomes Short Term: Will demonstrate readiness to quit, by selecting a quit date.;Long Term: Complete abstinence from all tobacco products for at least 12 months from quit date.;Short Term: Will quit all tobacco product use, adhering to prevention of relapse plan.   Improve shortness of breath with ADL's Yes   Intervention Provide education, individualized exercise plan and daily activity instruction to help decrease symptoms of SOB with activities of daily living.   Expected Outcomes Short Term: Achieves a reduction of symptoms when performing activities of daily living.   Diabetes Yes   Intervention Provide education about signs/symptoms and action to take for hypo/hyperglycemia.;Provide education about proper nutrition, including hydration, and aerobic/resistive exercise prescription along with prescribed medications to achieve blood glucose in normal ranges: Fasting glucose 65-99 mg/dL   Expected Outcomes Short Term: Participant verbalizes understanding of the signs/symptoms and immediate care of hyper/hypoglycemia, proper foot care and importance of medication, aerobic/resistive exercise and nutrition plan for blood glucose control.;Long  Term: Attainment of HbA1C < 7%.   Heart Failure Yes   Intervention Provide a combined exercise and nutrition program that is supplemented with education, support and counseling about heart failure. Directed  toward relieving symptoms such as shortness of breath, decreased exercise tolerance, and extremity edema.   Expected Outcomes Improve functional capacity of life;Short term: Attendance in program 2-3 days a week with increased exercise capacity. Reported lower sodium intake. Reported increased fruit and vegetable intake. Reports medication compliance.;Short term: Daily weights obtained and reported for increase. Utilizing diuretic protocols set by physician.;Long term: Adoption of self-care skills and reduction of barriers for early signs and symptoms recognition and intervention leading to self-care maintenance.   Hypertension Yes   Intervention Provide education on lifestyle modifcations including regular physical activity/exercise, weight management, moderate sodium restriction and increased consumption of fresh fruit, vegetables, and low fat dairy, alcohol moderation, and smoking cessation.;Monitor prescription use compliance.   Expected Outcomes Short Term: Continued assessment and intervention until BP is < 140/108mm HG in hypertensive participants. < 130/102mm HG in hypertensive participants with diabetes, heart failure or chronic kidney disease.;Long Term: Maintenance of blood pressure at goal levels.   Lipids Yes   Intervention Provide education and support for participant on nutrition & aerobic/resistive exercise along with prescribed medications to achieve LDL 70mg , HDL >40mg .   Expected Outcomes Short Term: Participant states understanding of desired cholesterol values and is compliant with medications prescribed. Participant is following exercise prescription and nutrition guidelines.;Long Term: Cholesterol controlled with medications as prescribed, with individualized exercise RX and with personalized nutrition plan. Value goals: LDL < 70mg , HDL > 40 mg.   Personal Goal Other Yes   Personal Goal short: get healthy, gain weight and increase muscle tone  long: improve EF   Intervention Provide  exercise programming and nutiriton to assist with weight gain and improve cardiovascular fitness and strength   Expected Outcomes Pt will gain weight, have better nutrition and cardiovascular fitness      Core Components/Risk Factors/Patient Goals Review:      Goals and Risk Factor Review    Row Name 12/17/15 0856             Core Components/Risk Factors/Patient Goals Review   Personal Goals Review Increase Strength and Stamina          Core Components/Risk Factors/Patient Goals at Discharge (Final Review):      Goals and Risk Factor Review - 12/17/15 0856      Core Components/Risk Factors/Patient Goals Review   Personal Goals Review Increase Strength and Stamina      ITP Comments:     ITP Comments    Row Name 12/17/15 0851           ITP Comments Dr. Armanda Magic, Medical Director          Comments: Pt in on 12/17/15 for cardiac rehab orientation from 0800-1030.  As a part of cardiac rehab orientation, pt completed 6 minute walk test. Pt tolerated well with no complaints of cp or sob.  Monitor showed SR with PAC's.  These are present on his most recent 12 lead ekg. Medications reviewed, pt brought his medications from the [place].  These differ from those listed in the [facility] system. Pt has upcoming appt at the [facility] and plans to have his medications consolidated so that he may have a clearer understanding of his medication regimen.  Pt is eager to begin exercising in cardiac rehab. Alanson Aly, BSN

## 2015-12-21 ENCOUNTER — Encounter (HOSPITAL_COMMUNITY)
Admission: RE | Admit: 2015-12-21 | Discharge: 2015-12-21 | Disposition: A | Discharge status: Home / Self Care | Payer: Medicare Other | Source: Ambulatory Visit | Attending: Cardiology | Admitting: Cardiology

## 2015-12-21 DIAGNOSIS — I5022 Chronic systolic (congestive) heart failure: Secondary | ICD-10-CM | POA: Diagnosis not present

## 2015-12-21 LAB — GLUCOSE, CAPILLARY
Glucose-Capillary: 299 mg/dL — ABNORMAL HIGH (ref 65–99)
Glucose-Capillary: 216 mg/dL — ABNORMAL HIGH (ref 65–99)

## 2015-12-21 NOTE — Progress Notes (Signed)
Daily Session Note  Patient Details  Name: [name] MRN: [id number] Date of Birth: [birth year] Referring Provider:   Flowsheet Row CARDIAC REHAB PHASE II ORIENTATION from 12/17/2015 in [place]  Referring Provider  [facility] MD [place] Pax coverage)      Encounter Date: 12/21/2015  Check In:     Session Check In - 12/21/15 1018      Check-In   Location [facility]   Staff Present Training and development officer, MS, ACSM RCEP, Exercise Physiologist;Yoselyn Mcglade, RN, BSN;Ramon Dredge, RN, MHA;Portia Rollene Rotunda, RN, BSN;Joann Rion, RN, BSN   Supervising physician immediately available to respond to emergencies [facility] immediately available   Physician(s) Dr. Marily Memos   Medication changes reported     No   Fall or balance concerns reported    No   Warm-up and Cool-down Performed as group-led instruction   Resistance Training Performed Yes   VAD Patient? No     Pain Assessment   Currently in Pain? No/denies   Multiple Pain Sites No      Capillary Blood Glucose: Results for orders placed or performed during the hospital encounter of 12/21/15 (from the past 24 hour(s))  Glucose, capillary     Status: Abnormal   Collection Time: 12/21/15  9:36 AM  Result Value Ref Range   Glucose-Capillary 299 (H) 65 - 99 mg/dL  Glucose, capillary     Status: Abnormal   Collection Time: 12/21/15 11:04 AM  Result Value Ref Range   Glucose-Capillary 216 (H) 65 - 99 mg/dL     Goals Met:  Exercise tolerated well  Goals Unmet:  Not Applicable  Comments: Pt started cardiac rehab today.  Pt tolerated light exercise without difficulty. VSS, telemetry-Sinus rhythm with arythmia, asymptomatic.  Medication list reconciled. Pt denies barriers to medicaiton compliance.  PSYCHOSOCIAL ASSESSMENT:  PHQ-0. Pt exhibits positive coping skills, hopeful outlook with supportive family. No psychosocial needs identified at this time, no psychosocial  interventions necessary.    Pt enjoys fishing and playing horseshoes.   Pt oriented to exercise equipment and routine.    Understanding verbalized. [name] was instructed to check his blood sugar prior to coming to exercise and to call if it is above 300.Barnet Pall, RN,BSN 12/21/2015 5:25 PM   Dr. Fransico Him is Medical Director for Cardiac Rehab at [facility].

## 2015-12-23 ENCOUNTER — Encounter (HOSPITAL_COMMUNITY)
Admission: RE | Admit: 2015-12-23 | Discharge: 2015-12-23 | Disposition: A | Discharge status: Home / Self Care | Payer: Medicare Other | Source: Ambulatory Visit | Attending: Cardiology | Admitting: Cardiology

## 2015-12-23 VITALS — BP 108/72 | HR 59 | Ht 75.0 in | Wt 167.3 lb

## 2015-12-23 DIAGNOSIS — I5022 Chronic systolic (congestive) heart failure: Secondary | ICD-10-CM

## 2015-12-23 LAB — GLUCOSE, CAPILLARY
Glucose-Capillary: 269 mg/dL — ABNORMAL HIGH (ref 65–99)
Glucose-Capillary: 262 mg/dL — ABNORMAL HIGH (ref 65–99)

## 2015-12-25 ENCOUNTER — Other Ambulatory Visit: Payer: Self-pay

## 2015-12-25 ENCOUNTER — Encounter (HOSPITAL_COMMUNITY): Payer: Self-pay

## 2015-12-25 ENCOUNTER — Encounter (HOSPITAL_COMMUNITY)
Admission: RE | Admit: 2015-12-25 | Discharge: 2015-12-25 | Disposition: A | Discharge status: Home / Self Care | Payer: Medicare Other | Source: Ambulatory Visit | Attending: Cardiology | Admitting: Cardiology

## 2015-12-25 ENCOUNTER — Emergency Department (HOSPITAL_COMMUNITY)
Admission: EM | Admit: 2015-12-25 | Discharge: 2015-12-25 | Disposition: A | Discharge status: Home / Self Care | Payer: Medicare Other | Attending: Emergency Medicine | Admitting: Emergency Medicine

## 2015-12-25 DIAGNOSIS — I2510 Atherosclerotic heart disease of native coronary artery without angina pectoris: Secondary | ICD-10-CM | POA: Diagnosis not present

## 2015-12-25 DIAGNOSIS — I5022 Chronic systolic (congestive) heart failure: Secondary | ICD-10-CM | POA: Insufficient documentation

## 2015-12-25 DIAGNOSIS — I110 Hypertensive heart disease with heart failure: Secondary | ICD-10-CM | POA: Insufficient documentation

## 2015-12-25 DIAGNOSIS — I959 Hypotension, unspecified: Secondary | ICD-10-CM | POA: Diagnosis present

## 2015-12-25 DIAGNOSIS — X58XXXA Exposure to other specified factors, initial encounter: Secondary | ICD-10-CM | POA: Insufficient documentation

## 2015-12-25 DIAGNOSIS — I252 Old myocardial infarction: Secondary | ICD-10-CM | POA: Diagnosis not present

## 2015-12-25 DIAGNOSIS — I952 Hypotension due to drugs: Secondary | ICD-10-CM | POA: Diagnosis not present

## 2015-12-25 DIAGNOSIS — Z79899 Other long term (current) drug therapy: Secondary | ICD-10-CM | POA: Insufficient documentation

## 2015-12-25 DIAGNOSIS — T464X5A Adverse effect of angiotensin-converting-enzyme inhibitors, initial encounter: Secondary | ICD-10-CM | POA: Insufficient documentation

## 2015-12-25 DIAGNOSIS — F17210 Nicotine dependence, cigarettes, uncomplicated: Secondary | ICD-10-CM | POA: Insufficient documentation

## 2015-12-25 DIAGNOSIS — I5043 Acute on chronic combined systolic (congestive) and diastolic (congestive) heart failure: Secondary | ICD-10-CM | POA: Diagnosis not present

## 2015-12-25 DIAGNOSIS — T447X1A Poisoning by beta-adrenoreceptor antagonists, accidental (unintentional), initial encounter: Secondary | ICD-10-CM | POA: Diagnosis not present

## 2015-12-25 DIAGNOSIS — Z794 Long term (current) use of insulin: Secondary | ICD-10-CM | POA: Insufficient documentation

## 2015-12-25 DIAGNOSIS — Z7984 Long term (current) use of oral hypoglycemic drugs: Secondary | ICD-10-CM | POA: Diagnosis not present

## 2015-12-25 DIAGNOSIS — Z7982 Long term (current) use of aspirin: Secondary | ICD-10-CM | POA: Insufficient documentation

## 2015-12-25 DIAGNOSIS — E119 Type 2 diabetes mellitus without complications: Secondary | ICD-10-CM | POA: Diagnosis not present

## 2015-12-25 LAB — BASIC METABOLIC PANEL
Anion gap: 9 (ref 5–15)
BUN: 11 mg/dL (ref 6–20)
CHLORIDE: 101 mmol/L (ref 101–111)
CO2: 25 mmol/L (ref 22–32)
CREATININE: 1.07 mg/dL (ref 0.61–1.24)
Calcium: 8.8 mg/dL — ABNORMAL LOW (ref 8.9–10.3)
GFR calc non Af Amer: >60 mL/min (ref >60)
Glucose, Bld: 276 mg/dL — ABNORMAL HIGH (ref 65–99)
POTASSIUM: 4.2 mmol/L (ref 3.5–5.1)
Sodium: 135 mmol/L (ref 135–145)

## 2015-12-25 LAB — CBC
HEMATOCRIT: 41.4 % (ref 39.0–52.0)
HEMOGLOBIN: 13.7 g/dL (ref 13.0–17.0)
MCH: 28.5 pg (ref 26.0–34.0)
MCHC: 33.1 g/dL (ref 30.0–36.0)
MCV: 86.3 fL (ref 78.0–100.0)
Platelets: 196 10*3/uL (ref 150–400)
RBC: 4.8 MIL/uL (ref 4.22–5.81)
RDW: 13.2 % (ref 11.5–15.5)
WBC: 4.6 10*3/uL (ref 4.0–10.5)

## 2015-12-25 LAB — URINALYSIS, ROUTINE W REFLEX MICROSCOPIC
BILIRUBIN URINE: NEGATIVE
GLUCOSE, UA: 500 mg/dL — AB
Hgb urine dipstick: NEGATIVE
KETONES UR: NEGATIVE mg/dL
LEUKOCYTES UA: NEGATIVE
NITRITE: NEGATIVE
PROTEIN: NEGATIVE mg/dL
Specific Gravity, Urine: 1.015 (ref 1.005–1.030)
pH: 5.5 (ref 5.0–8.0)

## 2015-12-25 LAB — GLUCOSE, CAPILLARY: Glucose-Capillary: 234 mg/dL — ABNORMAL HIGH (ref 65–99)

## 2015-12-25 LAB — I-STAT TROPONIN, ED: TROPONIN I, POC: 0.01 ng/mL (ref 0.00–0.08)

## 2015-12-25 MED ORDER — SODIUM CHLORIDE 0.9 % IV BOLUS (SEPSIS)
1000.0000 mL | Freq: Once | INTRAVENOUS | Status: AC
  Administered 2015-12-25: 1000 mL via INTRAVENOUS

## 2015-12-25 NOTE — Progress Notes (Signed)
Incomplete Session Note  Patient Details  Name: DEIONDRE CHORNEY MRN: [id number] Date of Birth: 01/20/1956 Referring Provider:   Flowsheet Row CARDIAC REHAB PHASE II ORIENTATION from 12/17/2015 in [facility]  Referring Provider  [facility] MD Mayford Knife coverage)      [name] did not complete his rehab session.  Initial blood pressure was 82/50. Patient reported not feeling well at all and looked pale. Delontay said he was started on a new medication from his psychiatrist at the [place]. [facility] [place] and [facility] that Azrael started taking Abilify, he took his first dose this morning. Donnivin is not sure he took his Wellbutrin or not. CBG 234. Recheck blood pressure 69/52 standing. heart rate 57 Sinus brady with arrhythmia. Patient was given Gatorade, placed on a stretcher and placed in Trendelenburg position. Rapid Response RN called for evaluation. Recheck blood pressure 112/68 lying. Sitting blood pressure 107/89. Standing blood pressure 86/69 via automatic cuff on the right arm. 73/52 on the left. Mr Espana agreed to go to the emergency room for further evaluation. Mr Foutch lives with his mother. Cochise lives with his mother. I called her to notify her of this mornings events. Mr Estella was transported to the ED via stretcher on the monitor And reported feeling better during transport. Report given to the ED RN. I called the [facility] and left a message with the cardiology clinic about today's events.

## 2015-12-25 NOTE — Discharge Instructions (Signed)
Please take blood pressure medication only as prescribed.  Follow up with your doctor for further care.  Return if you have any concerns.

## 2015-12-25 NOTE — ED Triage Notes (Signed)
Pt. Was in cardiac rehab from the [place] and they noticed his BP was low. His vitals were orthostatic upon their assessment. He complained of feeling dizzy and just "not right". Walked from stretcher to bed in ED with no problem. A/Ox4, and denies any weakness or dizziness at this time.

## 2015-12-25 NOTE — ED Provider Notes (Signed)
Medications  sodium chloride 0.9 % bolus 1,000 mL (0 mLs Intravenous Stopped 12/25/15 1255)    Patient Vitals for the past 24 hrs:  BP Temp Temp src Pulse Resp SpO2  12/25/15 1245 136/85 - - 65 16 99 %  12/25/15 1230 137/86 - - 62 15 100 %  12/25/15 1215 133/81 - - 60 12 100 %  12/25/15 1200 136/82 - - 63 18 100 %  12/25/15 1145 133/80 - - (!) 56 12 100 %  12/25/15 1130 127/72 - - (!) 53 14 99 %  12/25/15 1100 114/71 97.3 F (36.3 C) Oral (!) 52 18 99 %     Face-to-face evaluation   History: He presents for evaluation of low blood pressure found at cardiac rehabilitation, today. He states it was because he took an extra blood pressure medicine that he was not supposed to.     Physical exam: Alert, oriented. No dysarthria or aphasia. He is lucid. Moves arms and legs normally.  Medical screening examination/treatment/procedure(s) were conducted as a shared visit with non-physician practitioner(s) and myself.  I personally evaluated the patient during the encounter    Mancel Bale, MD 12/25/15 1825

## 2015-12-25 NOTE — ED Provider Notes (Signed)
[facility]-EMERGENCY DEPT Provider Note   CSN: [id number] Arrival date & time: 12/25/15  1052     History   Chief Complaint Chief Complaint  Patient presents with  . Hypotension    HPI [name] is a 60 y.o. male.  HPI   60 year old male with history of CHF, CAD, diabetes, hypertension, prior MI presenting for evaluation of low blood pressure. Patient recently started cardiac rehabilitation approximately a week ago from the [place] area and today when he went in for cardiac rehabilitation, the staff noticed that his blood pressure was low. It was noted that he had a positive orthostatic hypotension upon their assessment. Patient also did report feeling "not right" and expresses a sensation of lightheadedness and dizziness. He was sent here for further evaluation. While waiting the ED, patient states he feels much better. Patient believes his symptoms started due to changes in his blood pressure medication. Patient states he is supposed to stop taking carvedilol and start taking lisinopril but he accidentally took both medication this morning, which he felt may contribute to his low blood pressure and a sensation of lightheadedness. Otherwise, patient denies any other changes. He denies having fever, chills, headache, neck pain, chest pain, shortness of breath, back pain, abdominal pain, nausea vomiting diarrhea, focal numbness or weakness. He did a small breakfast this morning. He feels that he is back to his normal baseline.  Past Medical History:  Diagnosis Date  . CHF (congestive heart failure) (HCC)   . Coronary artery disease   . Depression   . Diabetes mellitus   . Hypertension   . Mental disorder   . MI (myocardial infarction) [facility]) 82   age 52 per patient    Patient Active Problem List   Diagnosis Date Noted  . Cough   . DNR (do not resuscitate) discussion   . Palliative care encounter   . Polysubstance abuse   . Pleural effusion   . Insulin dependent diabetes mellitus  (HCC)   . Hepatitis, viral   . Pleural effusion on right   . CHF (congestive heart failure) (HCC) 09/07/2015  . Acute on chronic combined systolic (congestive) and diastolic (congestive) heart failure (HCC) 09/07/2015  . Elevated troponin I level 03/10/2015  . Alcohol withdrawal (HCC)   . Type 2 diabetes mellitus with complication (HCC)   . 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 Acute respiratory failure with hypoxia (HCC)   . Alcohol abuse with intoxication (HCC)   . Essential hypertension   . Acute on chronic systolic CHF (congestive heart failure) (HCC)   . 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 Alcohol dependence with withdrawal delirium (HCC) 02/23/2014  . Hyponatremia 02/23/2014  . Transaminitis 02/22/2014  . Protein-calorie malnutrition (HCC) 02/22/2014  . Other pancytopenia (HCC) 02/22/2014  . Hypokalemia 02/22/2014  . Hypomagnesemia 02/22/2014  . Alcoholic ketoacidosis 02/20/2014  . Acute respiratory failure (HCC) 02/20/2014  . Systolic and diastolic CHF, acute on chronic (HCC) 02/20/2014  . ETOH abuse 12/21/2012  . Cocaine dependence (HCC) 12/21/2012  . Substance induced mood disorder (HCC) 12/21/2012  . Non Obstructive CAD (coronary artery disease) 08/14/2012  . Cardiomyopathy, nonischemic (HCC) 08/14/2012  . Systolic and diastolic CHF, acute (HCC) 08/14/2012  . Diabetes mellitus, type II (HCC) 08/11/2012  . Hypertension 08/11/2012  . History of substance abuse 08/11/2012    Past Surgical History:  Procedure Laterality Date  . ANGIOPLASTY  1984   at age 55  . LEFT AND RIGHT HEART CATHETERIZATION WITH CORONARY/GRAFT ANGIOGRAM  08/13/2012  Procedure: LEFT AND RIGHT HEART CATHETERIZATION WITH [name];  Surgeon: Kathleene Hazel, MD;  Location: [facility] CATH LAB;  Service: Cardiovascular;;       Home Medications    Prior to Admission medications   Medication Sig Start Date End Date Taking? Authorizing Provider  aspirin 81 MG tablet  Take 81 mg by mouth daily.   Yes Historical Provider, MD  buPROPion (WELLBUTRIN) 100 MG tablet Take 100 mg by mouth at bedtime.    Yes Historical Provider, MD  carvedilol (COREG) 3.125 MG tablet Take 15.625 mg by mouth 2 (two) times daily with a meal.   Yes Historical Provider, MD  carvedilol (COREG) 6.25 MG tablet Take 1 tablet (6.25 mg total) by mouth 2 (two) times daily with a meal. 09/16/15  Yes Jeralyn Bennett, MD  feeding supplement, GLUCERNA SHAKE, (GLUCERNA SHAKE) LIQD Take 237 mLs by mouth 3 (three) times daily between meals. 02/27/14  Yes Maryruth Bun Rama, MD  folic acid (FOLVITE) 1 MG tablet Take 1 tablet (1 mg total) by mouth daily. 02/27/14  Yes Christina P Rama, MD  furosemide (LASIX) 40 MG tablet Take 1 tablet (40 mg total) by mouth daily. 09/17/15  Yes Jeralyn Bennett, MD  guaiFENesin (MUCINEX) 600 MG 12 hr tablet Take 1 tablet (600 mg total) by mouth 2 (two) times daily. 09/15/15  Yes Albertine Grates, MD  hydrOXYzine (ATARAX/VISTARIL) 25 MG tablet Take 25 mg by mouth 3 (three) times daily as needed for anxiety or itching.   Yes Historical Provider, MD  Insulin Glargine (LANTUS SOLOSTAR) 100 UNIT/ML Solostar Pen Inject 20 Units into the skin every morning. 09/16/15  Yes Jeralyn Bennett, MD  lisinopril (PRINIVIL,ZESTRIL) 5 MG tablet Take 1 tablet (5 mg total) by mouth daily. Patient taking differently: Take 10 mg by mouth daily.  09/16/15  Yes Jeralyn Bennett, MD  magnesium oxide (MAG-OX) 400 (241.3 Mg) MG tablet Take 1 tablet (400 mg total) by mouth daily. 09/15/15  Yes Albertine Grates, MD  metFORMIN (GLUCOPHAGE) 1000 MG tablet Take 0.5 tablets (500 mg total) by mouth 2 (two) times daily with a meal. For high blood sugar control Patient taking differently: Take 1,000 mg by mouth 2 (two) times daily with a meal. For high blood sugar control 12/27/12  Yes Sanjuana Kava, NP  Multiple Vitamin (MULTIVITAMIN WITH MINERALS) TABS tablet Take 1 tablet by mouth daily. 02/27/14  Yes Christina P Rama, MD  potassium  chloride (K-DUR,KLOR-CON) 10 MEQ tablet Take 10 mEq by mouth daily.   Yes Historical Provider, MD  pravastatin (PRAVACHOL) 10 MG tablet Take 1 tablet (10 mg total) by mouth daily. For high cholesterol control 09/16/15  Yes Jeralyn Bennett, MD  sertraline (ZOLOFT) 100 MG tablet Take 100 mg by mouth daily.   Yes Historical Provider, MD  spironolactone (ALDACTONE) 25 MG tablet Take 25 mg by mouth daily.   Yes Historical Provider, MD  thiamine 100 MG tablet Take 1 tablet (100 mg total) by mouth daily. 02/27/14  Yes Maryruth Bun Rama, MD  traZODone (DESYREL) 100 MG tablet Take 1 tablet (100 mg total) by mouth at bedtime. For sleep Patient taking differently: Take 150 mg by mouth at bedtime. For sleep 12/27/12  Yes Sanjuana Kava, NP    Family History Family History  Problem Relation Age of Onset  . Heart attack Father     >60s  . Hypertension Father   . Diabetes Father   . Heart attack Sister   . Hypertension Mother     Social  History Social History  Substance Use Topics  . Smoking status: Current Every Day Smoker    Packs/day: 0.50    Types: Cigarettes  . Smokeless tobacco: Never Used  . Alcohol use No     Comment: sts he hasn't drunk in 6 months      Allergies   Review of patient's allergies indicates no known allergies.   Review of Systems Review of Systems  All other systems reviewed and are negative.    Physical Exam Updated Vital Signs BP 136/85   Pulse 65   Temp 97.3 F (36.3 C) (Oral)   Resp 16   SpO2 99%   Physical Exam  Constitutional: He appears well-developed and well-nourished. No distress.  Pt laying in bed resting comfortably, in no acute discomfort.  HENT:  Head: Atraumatic.  Eyes: Conjunctivae are normal.  Neck: Normal range of motion. Neck supple. No JVD present.  Cardiovascular: Normal rate, regular rhythm and intact distal pulses.   Pulmonary/Chest: Effort normal and breath sounds normal.  Abdominal: Soft. There is no tenderness.  Musculoskeletal:  He exhibits no edema.  5 out 5 strength to all 4 extremities  Neurological: He is alert.  Skin: No rash noted.  Psychiatric: He has a normal mood and affect.  Nursing note and vitals reviewed.    ED Treatments / Results  Labs (all labs ordered are listed, but only abnormal results are displayed) Labs Reviewed  BASIC METABOLIC PANEL - Abnormal; Notable for the following:       Result Value   Glucose, Bld 276 (*)    Calcium 8.8 (*)    All other components within normal limits  URINALYSIS, ROUTINE W REFLEX MICROSCOPIC ([facility]) - Abnormal; Notable for the following:    Glucose, UA 500 (*)    All other components within normal limits  CBC  I-STAT TROPOININ, ED  CBG MONITORING, ED    EKG  EKG Interpretation None      Date: 12/25/2015  Rate: 53  Rhythm: sinus bradycardia  QRS Axis: normal  Intervals: normal  ST/T Wave abnormalities: normal  Conduction Disutrbances: none  Narrative Interpretation: premature atrial complex  Old EKG Reviewed: No significant changes noted     Radiology No results found.  Procedures Procedures (including critical care time)  Medications Ordered in ED Medications  sodium chloride 0.9 % bolus 1,000 mL (0 mLs Intravenous Stopped 12/25/15 1255)     Initial Impression / Assessment and Plan / ED Course  I have reviewed the triage vital signs and the nursing notes.  Pertinent labs & imaging results that were available during my care of the patient were reviewed by me and considered in my medical decision making (see chart for details).  Clinical Course    BP 136/85   Pulse 65   Temp 97.3 F (36.3 C) (Oral)   Resp 16   SpO2 99%    Final Clinical Impressions(s) / ED Diagnoses   Final diagnoses:  Hypotension due to drugs    New Prescriptions New Prescriptions   No medications on file   1:33 PM Patient sent here from the cardiac rehabilitation facility for further evaluation of his hypotension. He does not have a  documented hypertension he in the ER. He has a normal orthostatic vital sign. No signs of anemia on his labs. Urine without signs of urine tract infection. Elevated CBG of 286 with a normal anion gap. EKG and troponin as well as chest x-ray are unremarkable. Patient is at his baseline.  Suspect transient hypotension from accidentally taken more than 1 blood pressure medication. Encouraged patient to follow-up for further care. Otherwise patient is stable for discharge. Care discussed with Dr. Effie Shy.     Fayrene Helper, PA-C 12/25/15 1512    Mancel Bale, MD 12/25/15 [phone]

## 2015-12-25 NOTE — Progress Notes (Signed)
Called to cardiac rehab.  Per patient he felt well when he got up this morning.  Then shortly after taking his morning medications he started to feel poorly, he denies feeling dizzy.  He has recently started Abilify and increased his coreg about a month ago.  Upon arrival RN noted that he was pale and didn't look good.  His HR was 55-60  BP 83/50.  He was given some Gatorade.  He states that he has been eating and drinking normally.  Orthostatic BP done  Lying 120/64  Sitting 88/55 Standing 69/40.  Patient agreeable to going to the ED for evaluation.  Transported to ED. Patient mildly dizzy walking from stretcher to stretcher.  RN at bedside.

## 2015-12-25 NOTE — ED Notes (Signed)
DC papers reviewed with pt. Denies pain and questions

## 2015-12-30 ENCOUNTER — Encounter (HOSPITAL_COMMUNITY): Payer: Medicare Other

## 2016-01-01 ENCOUNTER — Encounter (HOSPITAL_COMMUNITY): Payer: Medicare Other

## 2016-01-04 ENCOUNTER — Encounter (HOSPITAL_COMMUNITY)
Admission: RE | Admit: 2016-01-04 | Discharge: 2016-01-04 | Disposition: A | Discharge status: Home / Self Care | Payer: Medicare Other | Source: Ambulatory Visit | Attending: Cardiology | Admitting: Cardiology

## 2016-01-04 NOTE — Progress Notes (Signed)
Incomplete Session Note  Patient Details  Name: [name] MRN: [id number] Date of Birth: June 18, 1955 Referring Provider:   Flowsheet Row CARDIAC REHAB PHASE II ORIENTATION from 12/17/2015 in [facility]  Referring Provider  [facility] MD Mayford Knife coverage)      [name] did not complete his rehab session.  [name] returned to exercise at cardiac rehab per Dr Ernest Haber at the [facility] in [place]. Entry blood pressure was difficult to hear this morning.Doppler Blood pressure was 86 via doppler  Blood pressure 94/50 via automatic cuff. I did not let [name] exercise due to his low blood pressure. After given Gatorade recheck blood pressure 116/ 50 via automatic cuff. I talked with Tamela Oddi RN at the [facility] about toady's events. Will fax exercise flow sheets to Dr. Sherrilee Gilles office for review at the [facility]. [name] left cardiac rehab without complaints. Will get an updated list of [name]'s current medication changes. [name]'s coreg has been changed to metoprolol XL [name] was asymptomatic other than being a little lightheaded upon entry to cardiac rehab.Thomas Lighter, RN,BSN 01/04/2016 5:47 PM

## 2016-01-06 ENCOUNTER — Encounter (HOSPITAL_COMMUNITY): Payer: Medicare Other

## 2016-01-08 ENCOUNTER — Encounter (HOSPITAL_COMMUNITY)
Admission: RE | Admit: 2016-01-08 | Discharge: 2016-01-08 | Disposition: A | Discharge status: Home / Self Care | Payer: Medicare Other | Source: Ambulatory Visit | Attending: Cardiology | Admitting: Cardiology

## 2016-01-08 NOTE — Progress Notes (Signed)
Cardiac Individual Treatment Plan  Patient Details  Name: [name] MRN: [id number] Date of Birth: 10-17-1955 Referring Provider:   Flowsheet Row CARDIAC REHAB PHASE II ORIENTATION from 12/17/2015 in [facility] CARDIAC [facility]  Referring Provider  Lyndle Herrlich MD [place] [facility] coverage)      Initial Encounter Date:  Flowsheet Row CARDIAC REHAB PHASE II ORIENTATION from 12/17/2015 in [facility] CARDIAC REHAB  Date  12/17/15  Referring Provider  Lyndle Herrlich MD ([place] coverage)      Visit Diagnosis: No diagnosis found.  Patient's Home Medications on Admission:  Current Outpatient Prescriptions:  .  aspirin 81 MG tablet, Take 81 mg by mouth daily., Disp: , Rfl:  .  buPROPion (WELLBUTRIN) 100 MG tablet, Take 100 mg by mouth at bedtime. , Disp: , Rfl:  .  carvedilol (COREG) 3.125 MG tablet, Take 15.625 mg by mouth 2 (two) times daily with a meal., Disp: , Rfl:  .  carvedilol (COREG) 6.25 MG tablet, Take 1 tablet (6.25 mg total) by mouth 2 (two) times daily with a meal., Disp: 60 tablet, Rfl: 1 .  feeding supplement, GLUCERNA SHAKE, (GLUCERNA SHAKE) LIQD, Take 237 mLs by mouth 3 (three) times daily between meals., Disp: , Rfl: 0 .  folic acid (FOLVITE) 1 MG tablet, Take 1 tablet (1 mg total) by mouth daily., Disp: , Rfl:  .  furosemide (LASIX) 40 MG tablet, Take 1 tablet (40 mg total) by mouth daily., Disp: 30 tablet, Rfl: 2 .  guaiFENesin (MUCINEX) 600 MG 12 hr tablet, Take 1 tablet (600 mg total) by mouth 2 (two) times daily., Disp: 30 tablet, Rfl: 0 .  hydrOXYzine (ATARAX/VISTARIL) 25 MG tablet, Take 25 mg by mouth 3 (three) times daily as needed for anxiety or itching., Disp: , Rfl:  .  Insulin Glargine (LANTUS SOLOSTAR) 100 UNIT/ML Solostar Pen, Inject 20 Units into the skin every morning., Disp: 15 mL, Rfl: 0 .  lisinopril (PRINIVIL,ZESTRIL) 5 MG tablet, Take 1 tablet (5 mg total) by mouth daily. (Patient taking differently: Take 10 mg by mouth  daily. ), Disp: 30 tablet, Rfl: 0 .  magnesium oxide (MAG-OX) 400 (241.3 Mg) MG tablet, Take 1 tablet (400 mg total) by mouth daily., Disp: 30 tablet, Rfl: 0 .  metFORMIN (GLUCOPHAGE) 1000 MG tablet, Take 0.5 tablets (500 mg total) by mouth 2 (two) times daily with a meal. For high blood sugar control (Patient taking differently: Take 1,000 mg by mouth 2 (two) times daily with a meal. For high blood sugar control), Disp: , Rfl:  .  Multiple Vitamin (MULTIVITAMIN WITH MINERALS) TABS tablet, Take 1 tablet by mouth daily., Disp: , Rfl:  .  potassium chloride (K-DUR,KLOR-CON) 10 MEQ tablet, Take 10 mEq by mouth daily., Disp: , Rfl:  .  pravastatin (PRAVACHOL) 10 MG tablet, Take 1 tablet (10 mg total) by mouth daily. For high cholesterol control, Disp: 30 tablet, Rfl: 0 .  sertraline (ZOLOFT) 100 MG tablet, Take 100 mg by mouth daily., Disp: , Rfl:  .  spironolactone (ALDACTONE) 25 MG tablet, Take 25 mg by mouth daily., Disp: , Rfl:  .  thiamine 100 MG tablet, Take 1 tablet (100 mg total) by mouth daily., Disp: , Rfl:  .  traZODone (DESYREL) 100 MG tablet, Take 1 tablet (100 mg total) by mouth at bedtime. For sleep (Patient taking differently: Take 150 mg by mouth at bedtime. For sleep), Disp: 30 tablet, Rfl: 0  Past Medical History: Past Medical History:  Diagnosis Date  .  CHF (congestive heart failure) (HCC)   . Coronary artery disease   . Depression   . Diabetes mellitus   . Hypertension   . Mental disorder   . MI (myocardial infarction) (HCC) 91   age 60 per patient    Tobacco Use: History  Smoking Status  . Current Every Day Smoker  . Packs/day: 0.50  . Types: Cigarettes  Smokeless Tobacco  . Never Used    Labs: Recent Review Flowsheet Data    Labs for ITP Cardiac and Pulmonary Rehab Latest Ref Rng & Units 02/20/2014 02/20/2014 02/20/2014 03/10/2015 09/10/2015   Cholestrol 0 - 200 mg/dL - - - - -   LDLCALC 0 - 99 mg/dL - - - - -   HDL >16 mg/dL - - - - -   Trlycerides <150  mg/dL - - - - -   Hemoglobin A1c 4.8 - 5.6 % - - 7.3(H) 10.5(H) 11.7(H)   PHART 7.350 - 7.450 - 7.479(H) - - -   PCO2ART 35.0 - 45.0 mmHg - 27.1(L) - - -   HCO3 20.0 - 24.0 mEq/L 21.8 19.9(L) - - -   TCO2 0 - 100 mmol/L 20.1 17.6 - - -   ACIDBASEDEF 0.0 - 2.0 mmol/L 1.7 2.1(H) - - -   O2SAT % 35.8 89.7 - - -      Capillary Blood Glucose: Lab Results  Component Value Date   GLUCAP 234 (H) 12/25/2015   GLUCAP 262 (H) 12/23/2015   GLUCAP 269 (H) 12/23/2015   GLUCAP 216 (H) 12/21/2015   GLUCAP 299 (H) 12/21/2015     Exercise Target Goals:    Exercise Program Goal: Individual exercise prescription set with THRR, safety & activity barriers. Participant demonstrates ability to understand and report RPE using BORG scale, to self-measure pulse accurately, and to acknowledge the importance of the exercise prescription.  Exercise Prescription Goal: Starting with aerobic activity 30 plus minutes a day, 3 days per week for initial exercise prescription. Provide home exercise prescription and guidelines that participant acknowledges understanding prior to discharge.  Activity Barriers & Risk Stratification:     Activity Barriers & Cardiac Risk Stratification - 12/17/15 0853      Activity Barriers & Cardiac Risk Stratification   Activity Barriers None   Cardiac Risk Stratification High      6 Minute Walk:     6 Minute Walk    Row Name 12/17/15 1428         6 Minute Walk   Phase Initial     Distance 1947 feet     Walk Time 6 minutes     # of Rest Breaks 0     MPH 3.7     METS 4.9     RPE 10     Perceived Dyspnea  2     VO2 Peak 17.3     Symptoms Yes (comment)     Comments SOB with walk test. Dyspnea of  2      Resting HR 60 bpm     Resting BP 104/67     Max Ex. HR 102 bpm     Max Ex. BP 100/60     2 Minute Post BP 106/74        Initial Exercise Prescription:     Initial Exercise Prescription - 12/17/15 1400      Date of Initial Exercise RX and Referring  Provider   Date 12/17/15   Referring Provider [facility] MD ([facility] coverage)     Treadmill  MPH 3   Grade 1   Minutes 10   METs 3.71     Bike   Level 1   Minutes 10   METs 3.6     NuStep   Level 3   Minutes 10   METs 2.4     Prescription Details   Frequency (times per week) 3   Duration Progress to 30 minutes of continuous aerobic without signs/symptoms of physical distress     Intensity   THRR 40-80% of Max Heartrate 64-127   Ratings of Perceived Exertion 11-13   Perceived Dyspnea 0-4     Progression   Progression Continue to progress workloads to maintain intensity without signs/symptoms of physical distress.     Resistance Training   Training Prescription Yes   Weight 2   Reps 10-12      Perform Capillary Blood Glucose checks as needed.  Exercise Prescription Changes:      Exercise Prescription Changes    Row Name 01/08/16 1700             Exercise Review   Progression Yes         Response to Exercise   Blood Pressure (Admit) 108/72       Blood Pressure (Exercise) 124/60       Blood Pressure (Exit) 102/60       Heart Rate (Admit) 59 bpm       Heart Rate (Exercise) 91 bpm       Heart Rate (Exit) 66 bpm       Rating of Perceived Exertion (Exercise) 11       Duration Progress to 30 minutes of continuous aerobic without signs/symptoms of physical distress       Intensity THRR unchanged         Progression   Progression Continue to progress workloads to maintain intensity without signs/symptoms of physical distress.       Average METs 3.7         Resistance Training   Training Prescription Yes       Weight 3lbs       Reps 10-12         Interval Training   Interval Training No         Treadmill   MPH 3       Grade 1       Minutes 10       METs 3.71         Bike   Level 1       Minutes 10       METs 3.58         NuStep   Level 3       Minutes 10       METs 3.7          Exercise Comments:      Exercise Comments    Row  Name 01/08/16 1701           Exercise Comments Off to a good start with exercise.          Discharge Exercise Prescription (Final Exercise Prescription Changes):     Exercise Prescription Changes - 01/08/16 1700      Exercise Review   Progression Yes     Response to Exercise   Blood Pressure (Admit) 108/72   Blood Pressure (Exercise) 124/60   Blood Pressure (Exit) 102/60   Heart Rate (Admit) 59 bpm   Heart Rate (Exercise) 91 bpm   Heart Rate (Exit) 66  bpm   Rating of Perceived Exertion (Exercise) 11   Duration Progress to 30 minutes of continuous aerobic without signs/symptoms of physical distress   Intensity THRR unchanged     Progression   Progression Continue to progress workloads to maintain intensity without signs/symptoms of physical distress.   Average METs 3.7     Resistance Training   Training Prescription Yes   Weight 3lbs   Reps 10-12     Interval Training   Interval Training No     Treadmill   MPH 3   Grade 1   Minutes 10   METs 3.71     Bike   Level 1   Minutes 10   METs 3.58     NuStep   Level 3   Minutes 10   METs 3.7      Nutrition:  Target Goals: Understanding of nutrition guidelines, daily intake of sodium 1500mg , cholesterol 200mg , calories 30% from fat and 7% or less from saturated fats, daily to have 5 or more servings of fruits and vegetables.  Biometrics:     Pre Biometrics - 12/17/15 1433      Pre Biometrics   Waist Circumference 33 inches   Hip Circumference 36.75 inches   Waist to Hip Ratio 0.9 %   Triceps Skinfold 13 mm   % Body Fat 20.3 %   Grip Strength 32 kg   Flexibility 16 in   Single Leg Stand 4.4 seconds       Nutrition Therapy Plan and Nutrition Goals:     Nutrition Therapy & Goals - 12/23/15 1054      Nutrition Therapy   Diet High Calorie, High Protein     Personal Nutrition Goals   Personal Goal #1 1-2 lb wt gain to a wt gain goal of 6-24 lb at graduation from Cardiac Rehab      Intervention Plan   Intervention Prescribe, educate and counsel regarding individualized specific dietary modifications aiming towards targeted core components such as weight, hypertension, lipid management, diabetes, heart failure and other comorbidities.   Expected Outcomes Short Term Goal: Understand basic principles of dietary content, such as calories, fat, sodium, cholesterol and nutrients.;Long Term Goal: Adherence to prescribed nutrition plan.      Nutrition Discharge: Nutrition Scores:     Nutrition Assessments - 12/23/15 1053      MEDFICTS Scores   Pre Score 15  will verify score with pt      Nutrition Goals Re-Evaluation:   Psychosocial: Target Goals: Acknowledge presence or absence of depression, maximize coping skills, provide positive support system. Participant is able to verbalize types and ability to use techniques and skills needed for reducing stress and depression.  Initial Review & Psychosocial Screening:     Initial Psych Review & Screening - 12/18/15 1122      Family Dynamics   Good Support System? Yes      Quality of Life Scores:     Quality of Life - 12/17/15 1434      Quality of Life Scores   Health/Function Pre 15.5 %   Socioeconomic Pre 16.36 %   Psych/Spiritual Pre 15.07 %   Family Pre 14.75 %   GLOBAL Pre 15.5 %      PHQ-9: Recent Review Flowsheet Data    [facility] 12/21/2015 08/24/2012   Decreased Interest 0 0   Down, Depressed, Hopeless 0 0   PHQ - 2 Score 0 0      Psychosocial Evaluation and Intervention:  Psychosocial Re-Evaluation:     Psychosocial Re-Evaluation    Row Name 01/08/16 1655             Psychosocial Re-Evaluation   Interventions Stress management education       Continued Psychosocial Services Needed Yes  [name]          Vocational Rehabilitation: Provide vocational rehab assistance to qualifying candidates.   Vocational Rehab Evaluation &  Intervention:     Vocational Rehab - 12/18/15 1123      Initial Vocational Rehab Evaluation & Intervention   Assessment shows need for Vocational Rehabilitation No  Pt does not plan to return to competive employment.      Education: Education Goals: Education classes will be provided on a weekly basis, covering required topics. Participant will state understanding/return demonstration of topics presented.  Learning Barriers/Preferences:     Learning Barriers/Preferences - 12/17/15 0854      Learning Barriers/Preferences   Learning Barriers Sight   Learning Preferences Skilled Demonstration      Education Topics: Count Your Pulse:  -Group instruction provided by verbal instruction, demonstration, patient participation and written materials to support subject.  Instructors address importance of being able to find your pulse and how to count your pulse when at home without a heart monitor.  Patients get hands on experience counting their pulse with staff help and individually.   Heart Attack, Angina, and Risk Factor Modification:  -Group instruction provided by verbal instruction, video, and written materials to support subject.  Instructors address signs and symptoms of angina and heart attacks.    Also discuss risk factors for heart disease and how to make changes to improve heart health risk factors.   Functional Fitness:  -Group instruction provided by verbal instruction, demonstration, patient participation, and written materials to support subject.  Instructors address safety measures for doing things around the house.  Discuss how to get up and down off the floor, how to pick things up properly, how to safely get out of a chair without assistance, and balance training.   Meditation and Mindfulness:  -Group instruction provided by verbal instruction, patient participation, and written materials to support subject.  Instructor addresses importance of mindfulness and  meditation practice to help reduce stress and improve awareness.  Instructor also leads participants through a meditation exercise.    Stretching for Flexibility and Mobility:  -Group instruction provided by verbal instruction, patient participation, and written materials to support subject.  Instructors lead participants through series of stretches that are designed to increase flexibility thus improving mobility.  These stretches are additional exercise for major muscle groups that are typically performed during regular warm up and cool down.   Hands Only CPR Anytime:  -Group instruction provided by verbal instruction, video, patient participation and written materials to support subject.  Instructors co-teach with AHA video for hands only CPR.  Participants get hands on experience with mannequins.   Nutrition I class: Heart Healthy Eating:  -Group instruction provided by PowerPoint slides, verbal discussion, and written materials to support subject matter. The instructor gives an explanation and review of the Therapeutic Lifestyle Changes diet recommendations, which includes a discussion on lipid goals, dietary fat, sodium, fiber, plant stanol/sterol esters, sugar, and the components of a well-balanced, healthy diet.   Nutrition II class: Lifestyle Skills:  -Group instruction provided by PowerPoint slides, verbal discussion, and written materials to support subject matter. The instructor gives an explanation and review of label reading, grocery shopping  for heart health, heart healthy recipe modifications, and ways to make healthier choices when eating out.   Diabetes Question & Answer:  -Group instruction provided by PowerPoint slides, verbal discussion, and written materials to support subject matter. The instructor gives an explanation and review of diabetes co-morbidities, pre- and post-prandial blood glucose goals, pre-exercise blood glucose goals, signs, symptoms, and treatment of  hypoglycemia and hyperglycemia, and foot care basics.   Diabetes Blitz:  -Group instruction provided by PowerPoint slides, verbal discussion, and written materials to support subject matter. The instructor gives an explanation and review of the physiology behind type 1 and type 2 diabetes, diabetes medications and rational behind using different medications, pre- and post-prandial blood glucose recommendations and Hemoglobin A1c goals, diabetes diet, and exercise including blood glucose guidelines for exercising safely.    Portion Distortion:  -Group instruction provided by PowerPoint slides, verbal discussion, written materials, and food models to support subject matter. The instructor gives an explanation of serving size versus portion size, changes in portions sizes over the last 20 years, and what consists of a serving from each food group.   Stress Management:  -Group instruction provided by verbal instruction, video, and written materials to support subject matter.  Instructors review role of stress in heart disease and how to cope with stress positively.     Exercising on Your Own:  -Group instruction provided by verbal instruction, power point, and written materials to support subject.  Instructors discuss benefits of exercise, components of exercise, frequency and intensity of exercise, and end points for exercise.  Also discuss use of nitroglycerin and activating EMS.  Review options of places to exercise outside of rehab.  Review guidelines for sex with heart disease.   Cardiac Drugs I:  -Group instruction provided by verbal instruction and written materials to support subject.  Instructor reviews cardiac drug classes: antiplatelets, anticoagulants, beta blockers, and statins.  Instructor discusses reasons, side effects, and lifestyle considerations for each drug class.   Cardiac Drugs II:  -Group instruction provided by verbal instruction and written materials to support subject.   Instructor reviews cardiac drug classes: angiotensin converting enzyme inhibitors (ACE-I), angiotensin II receptor blockers (ARBs), nitrates, and calcium channel blockers.  Instructor discusses reasons, side effects, and lifestyle considerations for each drug class.   Anatomy and Physiology of the Circulatory System:  -Group instruction provided by verbal instruction, video, and written materials to support subject.  Reviews functional anatomy of heart, how it relates to various diagnoses, and what role the heart plays in the overall system.   Knowledge Questionnaire Score:     Knowledge Questionnaire Score - 12/23/15 1053      Knowledge Questionnaire Score   Pre Score 23/28   DM 8/15      Core Components/Risk Factors/Patient Goals at Admission:     Personal Goals and Risk Factors at Admission - 12/17/15 0856      Core Components/Risk Factors/Patient Goals on Admission    Weight Management Weight Gain   Increase Strength and Stamina Yes   Intervention Provide advice, education, support and counseling about physical activity/exercise needs.;Develop an individualized exercise prescription for aerobic and resistive training based on initial evaluation findings, risk stratification, comorbidities and participant's personal goals.   Expected Outcomes Achievement of increased cardiorespiratory fitness and enhanced flexibility, muscular endurance and strength shown through measurements of functional capacity and personal statement of participant.   Tobacco Cessation Yes   Intervention Assist the participant in steps to quit. Provide individualized education and counseling about committing  to Tobacco Cessation, relapse prevention, and pharmacological support that can be provided by physician.;Education officer, environmental, assist with locating and accessing local/national Quit Smoking programs, and support quit date choice.   Expected Outcomes Short Term: Will demonstrate readiness to quit, by  selecting a quit date.;Long Term: Complete abstinence from all tobacco products for at least 12 months from quit date.;Short Term: Will quit all tobacco product use, adhering to prevention of relapse plan.   Improve shortness of breath with ADL's Yes   Intervention Provide education, individualized exercise plan and daily activity instruction to help decrease symptoms of SOB with activities of daily living.   Expected Outcomes Short Term: Achieves a reduction of symptoms when performing activities of daily living.   Diabetes Yes   Intervention Provide education about signs/symptoms and action to take for hypo/hyperglycemia.;Provide education about proper nutrition, including hydration, and aerobic/resistive exercise prescription along with prescribed medications to achieve blood glucose in normal ranges: Fasting glucose 65-99 mg/dL   Expected Outcomes Short Term: Participant verbalizes understanding of the signs/symptoms and immediate care of hyper/hypoglycemia, proper foot care and importance of medication, aerobic/resistive exercise and nutrition plan for blood glucose control.;Long Term: Attainment of HbA1C < 7%.   Heart Failure Yes   Intervention Provide a combined exercise and nutrition program that is supplemented with education, support and counseling about heart failure. Directed toward relieving symptoms such as shortness of breath, decreased exercise tolerance, and extremity edema.   Expected Outcomes Improve functional capacity of life;Short term: Attendance in program 2-3 days a week with increased exercise capacity. Reported lower sodium intake. Reported increased fruit and vegetable intake. Reports medication compliance.;Short term: Daily weights obtained and reported for increase. Utilizing diuretic protocols set by physician.;Long term: Adoption of self-care skills and reduction of barriers for early signs and symptoms recognition and intervention leading to self-care maintenance.    Hypertension Yes   Intervention Provide education on lifestyle modifcations including regular physical activity/exercise, weight management, moderate sodium restriction and increased consumption of fresh fruit, vegetables, and low fat dairy, alcohol moderation, and smoking cessation.;Monitor prescription use compliance.   Expected Outcomes Short Term: Continued assessment and intervention until BP is < 140/49mm HG in hypertensive participants. < 130/33mm HG in hypertensive participants with diabetes, heart failure or chronic kidney disease.;Long Term: Maintenance of blood pressure at goal levels.   Lipids Yes   Intervention Provide education and support for participant on nutrition & aerobic/resistive exercise along with prescribed medications to achieve LDL 70mg , HDL >40mg .   Expected Outcomes Short Term: Participant states understanding of desired cholesterol values and is compliant with medications prescribed. Participant is following exercise prescription and nutrition guidelines.;Long Term: Cholesterol controlled with medications as prescribed, with individualized exercise RX and with personalized nutrition plan. Value goals: LDL < 70mg , HDL > 40 mg.   Personal Goal Other Yes   Personal Goal short: get healthy, gain weight and increase muscle tone  long: improve EF   Intervention Provide exercise programming and nutiriton to assist with weight gain and improve cardiovascular fitness and strength   Expected Outcomes Pt will gain weight, have better nutrition and cardiovascular fitness      Core Components/Risk Factors/Patient Goals Review:      Goals and Risk Factor Review    Row Name 12/17/15 0856 01/08/16 1702           Core Components/Risk Factors/Patient Goals Review   Personal Goals Review Increase Strength and Stamina Increase Strength and Stamina      Review  - Making progress  with exercise.      Expected Outcomes  - Begin regular exercise program 5-7 days per week to achieve  fitness goals         Core Components/Risk Factors/Patient Goals at Discharge (Final Review):      Goals and Risk Factor Review - 01/08/16 1702      Core Components/Risk Factors/Patient Goals Review   Personal Goals Review Increase Strength and Stamina   Review Making progress with exercise.   Expected Outcomes Begin regular exercise program 5-7 days per week to achieve fitness goals      ITP Comments:     ITP Comments    Row Name 12/17/15 0851           ITP Comments Dr. Armanda Magic, Medical Director          Comments: [name]  is making expected progress toward personal goals after completing 3 sessions. Recommend continued exercise and life style modification education including  stress management and relaxation techniques to decrease cardiac risk profile. Garlon has had issues with hypotension. Awaiting clearance from the [place] when [name] can return to exercise.Gladstone Lighter, RN,BSN 01/08/2016 5:08 PM

## 2016-01-11 ENCOUNTER — Encounter (HOSPITAL_COMMUNITY): Payer: Medicare Other

## 2016-01-13 ENCOUNTER — Encounter (HOSPITAL_COMMUNITY): Payer: Medicare Other

## 2016-01-15 ENCOUNTER — Encounter (HOSPITAL_COMMUNITY): Payer: Medicare Other

## 2016-01-18 ENCOUNTER — Encounter (HOSPITAL_COMMUNITY): Admission: RE | Admit: 2016-01-18 | Payer: Medicare Other | Source: Ambulatory Visit

## 2016-01-20 ENCOUNTER — Encounter (HOSPITAL_COMMUNITY): Payer: Medicare Other

## 2016-01-22 ENCOUNTER — Encounter (HOSPITAL_COMMUNITY): Payer: Medicare Other

## 2016-01-25 ENCOUNTER — Encounter (HOSPITAL_COMMUNITY): Admission: RE | Admit: 2016-01-25 | Payer: Medicare Other | Source: Ambulatory Visit

## 2016-01-26 ENCOUNTER — Telehealth (HOSPITAL_COMMUNITY): Payer: Self-pay | Admitting: *Deleted

## 2016-01-27 ENCOUNTER — Encounter (HOSPITAL_COMMUNITY): Payer: Medicare Other

## 2016-01-29 ENCOUNTER — Encounter (HOSPITAL_COMMUNITY): Payer: Medicare Other

## 2016-02-01 ENCOUNTER — Encounter (HOSPITAL_COMMUNITY)
Admission: RE | Admit: 2016-02-01 | Discharge: 2016-02-01 | Disposition: A | Discharge status: Home / Self Care | Payer: Medicare Other | Source: Ambulatory Visit | Attending: Cardiology | Admitting: Cardiology

## 2016-02-01 ENCOUNTER — Telehealth (HOSPITAL_COMMUNITY): Payer: Self-pay | Admitting: *Deleted

## 2016-02-01 DIAGNOSIS — I952 Hypotension due to drugs: Secondary | ICD-10-CM | POA: Insufficient documentation

## 2016-02-01 DIAGNOSIS — I5022 Chronic systolic (congestive) heart failure: Secondary | ICD-10-CM | POA: Insufficient documentation

## 2016-02-01 NOTE — Progress Notes (Signed)
Discharge Summary  Patient Details  Name: [name] MRN: [id number] Date of Birth: [birth year] Referring Provider:   Flowsheet Row CARDIAC REHAB PHASE II ORIENTATION from 12/17/2015 in [facility] CARDIAC [facility]  Referring Provider  [facility] MD ([facility] coverage)       Number of Visits: 3  Reason for Discharge:  Early Exit:  due to non attendance  Smoking History:  History  Smoking Status  . Current Every Day Smoker  . Packs/day: 0.50  . Types: Cigarettes  Smokeless Tobacco  . Never Used    Diagnosis:  No diagnosis found.  ADL UCSD:   Initial Exercise Prescription:     Initial Exercise Prescription - 12/17/15 1400      Date of Initial Exercise RX and Referring Provider   Date 12/17/15   Referring Provider [facility] MD ([facility] coverage)     Treadmill   MPH 3   Grade 1   Minutes 10   METs 3.71     Bike   Level 1   Minutes 10   METs 3.6     NuStep   Level 3   Minutes 10   METs 2.4     Prescription Details   Frequency (times per week) 3   Duration Progress to 30 minutes of continuous aerobic without signs/symptoms of physical distress     Intensity   THRR 40-80% of Max Heartrate 64-127   Ratings of Perceived Exertion 11-13   Perceived Dyspnea 0-4     Progression   Progression Continue to progress workloads to maintain intensity without signs/symptoms of physical distress.     Resistance Training   Training Prescription Yes   Weight 2   Reps 10-12      Discharge Exercise Prescription (Final Exercise Prescription Changes):     Exercise Prescription Changes - 01/08/16 1700      Exercise Review   Progression Yes     Response to Exercise   Blood Pressure (Admit) 108/72   Blood Pressure (Exercise) 124/60   Blood Pressure (Exit) 102/60   Heart Rate (Admit) 59 bpm   Heart Rate (Exercise) 91 bpm   Heart Rate (Exit) 66 bpm   Rating of Perceived Exertion (Exercise) 11   Duration Progress to 30 minutes of  continuous aerobic without signs/symptoms of physical distress   Intensity THRR unchanged     Progression   Progression Continue to progress workloads to maintain intensity without signs/symptoms of physical distress.   Average METs 3.7     Resistance Training   Training Prescription Yes   Weight 3lbs   Reps 10-12     Interval Training   Interval Training No     Treadmill   MPH 3   Grade 1   Minutes 10   METs 3.71     Bike   Level 1   Minutes 10   METs 3.58     NuStep   Level 3   Minutes 10   METs 3.7      Functional Capacity:     6 Minute Walk    Row Name 12/17/15 1428         6 Minute Walk   Phase Initial     Distance 1947 feet     Walk Time 6 minutes     # of Rest Breaks 0     MPH 3.7     METS 4.9     RPE 10     Perceived Dyspnea  2  VO2 Peak 17.3     Symptoms Yes (comment)     Comments SOB with walk test. Dyspnea of  2      Resting HR 60 bpm     Resting BP 104/67     Max Ex. HR 102 bpm     Max Ex. BP 100/60     2 Minute Post BP 106/74        Psychological, QOL, Others - Outcomes: PHQ 2/9: Depression screen [facility] 2/9 12/21/2015 08/24/2012  Decreased Interest 0 0  Down, Depressed, Hopeless 0 0  PHQ - 2 Score 0 0    Quality of Life:     Quality of Life - 12/17/15 1434      Quality of Life Scores   Health/Function Pre 15.5 %   Socioeconomic Pre 16.36 %   Psych/Spiritual Pre 15.07 %   Family Pre 14.75 %   GLOBAL Pre 15.5 %      Personal Goals: Goals established at orientation with interventions provided to work toward goal.     Personal Goals and Risk Factors at Admission - 12/17/15 0856      Core Components/Risk Factors/Patient Goals on Admission    Weight Management Weight Gain   Increase Strength and Stamina Yes   Intervention Provide advice, education, support and counseling about physical activity/exercise needs.;Develop an individualized exercise prescription for aerobic and resistive training based on initial evaluation  findings, risk stratification, comorbidities and participant's personal goals.   Expected Outcomes Achievement of increased cardiorespiratory fitness and enhanced flexibility, muscular endurance and strength shown through measurements of functional capacity and personal statement of participant.   Tobacco Cessation Yes   Intervention Assist the participant in steps to quit. Provide individualized education and counseling about committing to Tobacco Cessation, relapse prevention, and pharmacological support that can be provided by physician.;Education officer, environmental, assist with locating and accessing local/national Quit Smoking programs, and support quit date choice.   Expected Outcomes Short Term: Will demonstrate readiness to quit, by selecting a quit date.;Long Term: Complete abstinence from all tobacco products for at least 12 months from quit date.;Short Term: Will quit all tobacco product use, adhering to prevention of relapse plan.   Improve shortness of breath with ADL's Yes   Intervention Provide education, individualized exercise plan and daily activity instruction to help decrease symptoms of SOB with activities of daily living.   Expected Outcomes Short Term: Achieves a reduction of symptoms when performing activities of daily living.   Diabetes Yes   Intervention Provide education about signs/symptoms and action to take for hypo/hyperglycemia.;Provide education about proper nutrition, including hydration, and aerobic/resistive exercise prescription along with prescribed medications to achieve blood glucose in normal ranges: Fasting glucose 65-99 mg/dL   Expected Outcomes Short Term: Participant verbalizes understanding of the signs/symptoms and immediate care of hyper/hypoglycemia, proper foot care and importance of medication, aerobic/resistive exercise and nutrition plan for blood glucose control.;Long Term: Attainment of HbA1C < 7%.   Heart Failure Yes   Intervention Provide a  combined exercise and nutrition program that is supplemented with education, support and counseling about heart failure. Directed toward relieving symptoms such as shortness of breath, decreased exercise tolerance, and extremity edema.   Expected Outcomes Improve functional capacity of life;Short term: Attendance in program 2-3 days a week with increased exercise capacity. Reported lower sodium intake. Reported increased fruit and vegetable intake. Reports medication compliance.;Short term: Daily weights obtained and reported for increase. Utilizing diuretic protocols set by physician.;Long term: Adoption of self-care skills and reduction  of barriers for early signs and symptoms recognition and intervention leading to self-care maintenance.   Hypertension Yes   Intervention Provide education on lifestyle modifcations including regular physical activity/exercise, weight management, moderate sodium restriction and increased consumption of fresh fruit, vegetables, and low fat dairy, alcohol moderation, and smoking cessation.;Monitor prescription use compliance.   Expected Outcomes Short Term: Continued assessment and intervention until BP is < 140/4690mm HG in hypertensive participants. < 130/5180mm HG in hypertensive participants with diabetes, heart failure or chronic kidney disease.;Long Term: Maintenance of blood pressure at goal levels.   Lipids Yes   Intervention Provide education and support for participant on nutrition & aerobic/resistive exercise along with prescribed medications to achieve LDL 70mg , HDL >40mg .   Expected Outcomes Short Term: Participant states understanding of desired cholesterol values and is compliant with medications prescribed. Participant is following exercise prescription and nutrition guidelines.;Long Term: Cholesterol controlled with medications as prescribed, with individualized exercise RX and with personalized nutrition plan. Value goals: LDL < 70mg , HDL > 40 mg.   Personal  Goal Other Yes   Personal Goal short: get healthy, gain weight and increase muscle tone  long: improve EF   Intervention Provide exercise programming and nutiriton to assist with weight gain and improve cardiovascular fitness and strength   Expected Outcomes Pt will gain weight, have better nutrition and cardiovascular fitness       Personal Goals Discharge:     Goals and Risk Factor Review    Row Name 12/17/15 0856 01/08/16 1702           Core Components/Risk Factors/Patient Goals Review   Personal Goals Review Increase Strength and Stamina Increase Strength and Stamina      Review  - Making progress with exercise.      Expected Outcomes  - Begin regular exercise program 5-7 days per week to achieve fitness goals         Nutrition & Weight - Outcomes:     Pre Biometrics - 12/17/15 1433      Pre Biometrics   Waist Circumference 33 inches   Hip Circumference 36.75 inches   Waist to Hip Ratio 0.9 %   Triceps Skinfold 13 mm   % Body Fat 20.3 %   Grip Strength 32 kg   Flexibility 16 in   Single Leg Stand 4.4 seconds       Nutrition:     Nutrition Therapy & Goals - 12/23/15 1054      Nutrition Therapy   Diet High Calorie, High Protein     Personal Nutrition Goals   Personal Goal #1 1-2 lb wt gain to a wt gain goal of 6-24 lb at graduation from Cardiac Rehab     Intervention Plan   Intervention Prescribe, educate and counsel regarding individualized specific dietary modifications aiming towards targeted core components such as weight, hypertension, lipid management, diabetes, heart failure and other comorbidities.   Expected Outcomes Short Term Goal: Understand basic principles of dietary content, such as calories, fat, sodium, cholesterol and nutrients.;Long Term Goal: Adherence to prescribed nutrition plan.      Nutrition Discharge:     Nutrition Assessments - 12/23/15 1053      MEDFICTS Scores   Pre Score 15  will verify score with pt      Education  Questionnaire Score:     Knowledge Questionnaire Score - 12/23/15 1053      Knowledge Questionnaire Score   Pre Score 23/28   DM 8/15      Mr  Waggoner attended 3 exercise sessions and has been discharged for nonattendance. Mr Harreslson's last day of exercise was on 12/23/2015.Gladstone Lighter, RN,BSN 02/01/2016 9:10 AM

## 2016-02-03 ENCOUNTER — Encounter (HOSPITAL_COMMUNITY): Payer: Medicare Other

## 2016-02-05 ENCOUNTER — Encounter (HOSPITAL_COMMUNITY): Payer: Medicare Other

## 2016-02-08 ENCOUNTER — Encounter (HOSPITAL_COMMUNITY): Payer: Medicare Other

## 2016-02-10 ENCOUNTER — Encounter (HOSPITAL_COMMUNITY): Payer: Medicare Other

## 2016-02-12 ENCOUNTER — Encounter (HOSPITAL_COMMUNITY): Payer: Medicare Other

## 2016-02-15 ENCOUNTER — Encounter (HOSPITAL_COMMUNITY): Payer: Medicare Other

## 2016-02-16 NOTE — Addendum Note (Signed)
Encounter addended by: Artist Pais on: 02/16/2016 11:07 AM<BR>    Actions taken: Flowsheet data copied forward, Sign clinical note, Visit Navigator Flowsheet section accepted, Vitals modified

## 2016-02-16 NOTE — Addendum Note (Signed)
Encounter addended by: Artist Pais on: 02/16/2016 11:13 AM<BR>    Actions taken: Visit Navigator Flowsheet section accepted

## 2016-02-16 NOTE — Progress Notes (Signed)
Vital signs from last exercise session at cardiac rehab on 12/23/15.

## 2016-02-17 ENCOUNTER — Encounter (HOSPITAL_COMMUNITY): Payer: Medicare Other

## 2016-02-19 ENCOUNTER — Encounter (HOSPITAL_COMMUNITY): Payer: Medicare Other

## 2016-02-22 ENCOUNTER — Encounter (HOSPITAL_COMMUNITY): Payer: Medicare Other

## 2016-02-24 ENCOUNTER — Encounter (HOSPITAL_COMMUNITY): Payer: Medicare Other

## 2016-02-26 ENCOUNTER — Encounter (HOSPITAL_COMMUNITY): Payer: Medicare Other

## 2016-04-10 ENCOUNTER — Encounter (HOSPITAL_COMMUNITY): Payer: Self-pay | Admitting: Emergency Medicine

## 2016-04-10 ENCOUNTER — Emergency Department (HOSPITAL_COMMUNITY): Payer: Medicare Other

## 2016-04-10 ENCOUNTER — Inpatient Hospital Stay (HOSPITAL_COMMUNITY)
Admission: EM | Admit: 2016-04-10 | Discharge: 2016-04-14 | DRG: 292 | Disposition: A | Discharge status: Home / Self Care | Payer: Medicare Other | Attending: Internal Medicine | Admitting: Internal Medicine

## 2016-04-10 DIAGNOSIS — F1910 Other psychoactive substance abuse, uncomplicated: Secondary | ICD-10-CM | POA: Diagnosis present

## 2016-04-10 DIAGNOSIS — F329 Major depressive disorder, single episode, unspecified: Secondary | ICD-10-CM | POA: Diagnosis present

## 2016-04-10 DIAGNOSIS — F17220 Nicotine dependence, chewing tobacco, uncomplicated: Secondary | ICD-10-CM | POA: Diagnosis present

## 2016-04-10 DIAGNOSIS — Z23 Encounter for immunization: Secondary | ICD-10-CM | POA: Diagnosis not present

## 2016-04-10 DIAGNOSIS — I110 Hypertensive heart disease with heart failure: Principal | ICD-10-CM | POA: Diagnosis present

## 2016-04-10 DIAGNOSIS — I509 Heart failure, unspecified: Secondary | ICD-10-CM

## 2016-04-10 DIAGNOSIS — R739 Hyperglycemia, unspecified: Secondary | ICD-10-CM

## 2016-04-10 DIAGNOSIS — F1010 Alcohol abuse, uncomplicated: Secondary | ICD-10-CM | POA: Diagnosis not present

## 2016-04-10 DIAGNOSIS — Z833 Family history of diabetes mellitus: Secondary | ICD-10-CM

## 2016-04-10 DIAGNOSIS — Z79899 Other long term (current) drug therapy: Secondary | ICD-10-CM

## 2016-04-10 DIAGNOSIS — I428 Other cardiomyopathies: Secondary | ICD-10-CM | POA: Diagnosis not present

## 2016-04-10 DIAGNOSIS — I493 Ventricular premature depolarization: Secondary | ICD-10-CM | POA: Diagnosis not present

## 2016-04-10 DIAGNOSIS — Z794 Long term (current) use of insulin: Secondary | ICD-10-CM

## 2016-04-10 DIAGNOSIS — Z7982 Long term (current) use of aspirin: Secondary | ICD-10-CM | POA: Diagnosis not present

## 2016-04-10 DIAGNOSIS — E1100 Type 2 diabetes mellitus with hyperosmolarity without nonketotic hyperglycemic-hyperosmolar coma (NKHHC): Secondary | ICD-10-CM | POA: Diagnosis not present

## 2016-04-10 DIAGNOSIS — Z8249 Family history of ischemic heart disease and other diseases of the circulatory system: Secondary | ICD-10-CM

## 2016-04-10 DIAGNOSIS — I5043 Acute on chronic combined systolic (congestive) and diastolic (congestive) heart failure: Secondary | ICD-10-CM | POA: Diagnosis present

## 2016-04-10 DIAGNOSIS — Z9861 Coronary angioplasty status: Secondary | ICD-10-CM | POA: Diagnosis not present

## 2016-04-10 DIAGNOSIS — I248 Other forms of acute ischemic heart disease: Secondary | ICD-10-CM | POA: Diagnosis not present

## 2016-04-10 DIAGNOSIS — E440 Moderate protein-calorie malnutrition: Secondary | ICD-10-CM | POA: Insufficient documentation

## 2016-04-10 DIAGNOSIS — I5023 Acute on chronic systolic (congestive) heart failure: Secondary | ICD-10-CM | POA: Diagnosis not present

## 2016-04-10 DIAGNOSIS — R0602 Shortness of breath: Secondary | ICD-10-CM | POA: Diagnosis not present

## 2016-04-10 DIAGNOSIS — I2510 Atherosclerotic heart disease of native coronary artery without angina pectoris: Secondary | ICD-10-CM | POA: Diagnosis not present

## 2016-04-10 DIAGNOSIS — R069 Unspecified abnormalities of breathing: Secondary | ICD-10-CM | POA: Diagnosis not present

## 2016-04-10 DIAGNOSIS — Z6820 Body mass index (BMI) 20.0-20.9, adult: Secondary | ICD-10-CM | POA: Diagnosis not present

## 2016-04-10 DIAGNOSIS — I252 Old myocardial infarction: Secondary | ICD-10-CM

## 2016-04-10 DIAGNOSIS — E1165 Type 2 diabetes mellitus with hyperglycemia: Secondary | ICD-10-CM | POA: Diagnosis not present

## 2016-04-10 DIAGNOSIS — R079 Chest pain, unspecified: Secondary | ICD-10-CM | POA: Diagnosis not present

## 2016-04-10 LAB — BASIC METABOLIC PANEL
Anion gap: 10 (ref 5–15)
BUN: 7 mg/dL (ref 6–20)
CO2: 22 mmol/L (ref 22–32)
CREATININE: 0.89 mg/dL (ref 0.61–1.24)
Calcium: 8.1 mg/dL — ABNORMAL LOW (ref 8.9–10.3)
Chloride: 97 mmol/L — ABNORMAL LOW (ref 101–111)
GFR calc Af Amer: >60 mL/min (ref >60)
GLUCOSE: 274 mg/dL — AB (ref 65–99)
Potassium: 3.9 mmol/L (ref 3.5–5.1)
SODIUM: 129 mmol/L — AB (ref 135–145)

## 2016-04-10 LAB — CBC
HCT: 42.2 % (ref 39.0–52.0)
Hemoglobin: 15.2 g/dL (ref 13.0–17.0)
MCH: 30.1 pg (ref 26.0–34.0)
MCHC: 36 g/dL (ref 30.0–36.0)
MCV: 83.6 fL (ref 78.0–100.0)
PLATELETS: 222 10*3/uL (ref 150–400)
RBC: 5.05 MIL/uL (ref 4.22–5.81)
RDW: 12.1 % (ref 11.5–15.5)
WBC: 5.9 10*3/uL (ref 4.0–10.5)

## 2016-04-10 LAB — I-STAT TROPONIN, ED: Troponin i, poc: 0.06 ng/mL (ref 0.00–0.08)

## 2016-04-10 MED ORDER — ALBUTEROL SULFATE (2.5 MG/3ML) 0.083% IN NEBU
5.0000 mg | INHALATION_SOLUTION | Freq: Once | RESPIRATORY_TRACT | Status: AC
  Administered 2016-04-10: 5 mg via RESPIRATORY_TRACT
  Filled 2016-04-10: qty 6

## 2016-04-10 MED ORDER — FUROSEMIDE 10 MG/ML IJ SOLN
40.0000 mg | Freq: Once | INTRAMUSCULAR | Status: AC
  Administered 2016-04-10: 40 mg via INTRAVENOUS
  Filled 2016-04-10: qty 4

## 2016-04-10 NOTE — ED Provider Notes (Signed)
[facility]-EMERGENCY DEPT Provider Note   CSN: [id number] Arrival date & time: 04/10/16  2039     History   Chief Complaint Chief Complaint  Patient presents with  . Shortness of Breath    HPI [name] is a 60 y.o. male.  HPI  60 y.o. male with a hx of CHF, CAD, DM, presents to the Emergency Department today complaining of shortness of breath x 2 days. Pt states that he traveled recently and forgot lasix at home. Normally takes 40mg  QD. Notes increasing SOB especially with lying prone at night to sleep. Notes mild dyspnea on exertion. Mild CP intermittently. No ABD pain. No N/V/D. No numbness/tingling. No diaphoresis. Notes no fevers. No URI symptoms. No other symptoms noted  Past Medical History:  Diagnosis Date  . CHF (congestive heart failure) (HCC)   . Coronary artery disease   . Depression   . Diabetes mellitus   . Hypertension   . Mental disorder   . MI (myocardial infarction) 511984   age 60 per patient    Patient Active Problem List   Diagnosis Date Noted  . Cough   . DNR (do not resuscitate) discussion   . Palliative care encounter   . Polysubstance abuse   . Pleural effusion   . Insulin dependent diabetes mellitus (HCC)   . Hepatitis, viral   . Pleural effusion on right   . CHF (congestive heart failure) (HCC) 09/07/2015  . Acute on chronic combined systolic (congestive) and diastolic (congestive) heart failure 09/07/2015  . Elevated troponin I level 03/10/2015  . Alcohol withdrawal (HCC)   . Type 2 diabetes mellitus with complication (HCC)   . 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 Acute respiratory failure with hypoxia (HCC)   . Alcohol abuse with intoxication (HCC)   . Essential hypertension   . Acute on chronic systolic CHF (congestive heart failure) (HCC)   . 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 Alcohol dependence with withdrawal delirium (HCC) 02/23/2014  . Hyponatremia 02/23/2014  . Transaminitis 02/22/2014  . Protein-calorie  malnutrition (HCC) 02/22/2014  . Other pancytopenia (HCC) 02/22/2014  . Hypokalemia 02/22/2014  . Hypomagnesemia 02/22/2014  . Alcoholic ketoacidosis 02/20/2014  . Acute respiratory failure (HCC) 02/20/2014  . Systolic and diastolic CHF, acute on chronic (HCC) 02/20/2014  . ETOH abuse 12/21/2012  . Cocaine dependence (HCC) 12/21/2012  . Substance induced mood disorder (HCC) 12/21/2012  . Non Obstructive CAD (coronary artery disease) 08/14/2012  . Cardiomyopathy, nonischemic (HCC) 08/14/2012  . Systolic and diastolic CHF, acute (HCC) 08/14/2012  . Diabetes mellitus, type II (HCC) 08/11/2012  . Hypertension 08/11/2012  . History of substance abuse 08/11/2012    Past Surgical History:  Procedure Laterality Date  . ANGIOPLASTY  1984   at age 60  . LEFT AND RIGHT HEART CATHETERIZATION WITH CORONARY/GRAFT ANGIOGRAM  08/13/2012   Procedure: LEFT AND RIGHT HEART CATHETERIZATION WITH Isabel CapriceORONARY/GRAFT ANGIOGRAM;  Surgeon: Kathleene Hazelhristopher D McAlhany, MD;  [facility];  Service: Cardiovascular;;       Home Medications    Prior to Admission medications   Medication Sig Start Date End Date Taking? Authorizing Provider  aspirin 81 MG tablet Take 81 mg by mouth daily.    Historical Provider, MD  buPROPion (WELLBUTRIN) 100 MG tablet Take 100 mg by mouth at bedtime.     Historical Provider, MD  carvedilol (COREG) 3.125 MG tablet Take 15.625 mg by mouth 2 (two) times daily with a meal.    Historical Provider, MD  carvedilol (COREG)  6.25 MG tablet Take 1 tablet (6.25 mg total) by mouth 2 (two) times daily with a meal. 09/16/15   Jeralyn Bennett, MD  feeding supplement, GLUCERNA SHAKE, (GLUCERNA SHAKE) LIQD Take 237 mLs by mouth 3 (three) times daily between meals. 02/27/14   Maryruth Bun Rama, MD  folic acid (FOLVITE) 1 MG tablet Take 1 tablet (1 mg total) by mouth daily. 02/27/14   Maryruth Bun Rama, MD  furosemide (LASIX) 40 MG tablet Take 1 tablet (40 mg total) by mouth daily. 09/17/15   Jeralyn Bennett, MD  guaiFENesin (MUCINEX) 600 MG 12 hr tablet Take 1 tablet (600 mg total) by mouth 2 (two) times daily. 09/15/15   Albertine Grates, MD  hydrOXYzine (ATARAX/VISTARIL) 25 MG tablet Take 25 mg by mouth 3 (three) times daily as needed for anxiety or itching.    Historical Provider, MD  Insulin Glargine (LANTUS SOLOSTAR) 100 UNIT/ML Solostar Pen Inject 20 Units into the skin every morning. 09/16/15   Jeralyn Bennett, MD  lisinopril (PRINIVIL,ZESTRIL) 5 MG tablet Take 1 tablet (5 mg total) by mouth daily. Patient taking differently: Take 10 mg by mouth daily.  09/16/15   Jeralyn Bennett, MD  magnesium oxide (MAG-OX) 400 (241.3 Mg) MG tablet Take 1 tablet (400 mg total) by mouth daily. 09/15/15   Albertine Grates, MD  metFORMIN (GLUCOPHAGE) 1000 MG tablet Take 0.5 tablets (500 mg total) by mouth 2 (two) times daily with a meal. For high blood sugar control Patient taking differently: Take 1,000 mg by mouth 2 (two) times daily with a meal. For high blood sugar control 12/27/12   Sanjuana Kava, NP  Multiple Vitamin (MULTIVITAMIN WITH MINERALS) TABS tablet Take 1 tablet by mouth daily. 02/27/14   Maryruth Bun Rama, MD  potassium chloride (K-DUR,KLOR-CON) 10 MEQ tablet Take 10 mEq by mouth daily.    Historical Provider, MD  pravastatin (PRAVACHOL) 10 MG tablet Take 1 tablet (10 mg total) by mouth daily. For high cholesterol control 09/16/15   Jeralyn Bennett, MD  sertraline (ZOLOFT) 100 MG tablet Take 100 mg by mouth daily.    Historical Provider, MD  spironolactone (ALDACTONE) 25 MG tablet Take 25 mg by mouth daily.    Historical Provider, MD  thiamine 100 MG tablet Take 1 tablet (100 mg total) by mouth daily. 02/27/14   Maryruth Bun Rama, MD  traZODone (DESYREL) 100 MG tablet Take 1 tablet (100 mg total) by mouth at bedtime. For sleep Patient taking differently: Take 150 mg by mouth at bedtime. For sleep 12/27/12   Sanjuana Kava, NP    Family History Family History  Problem Relation Age of Onset  . Heart attack Father      >60s  . Hypertension Father   . Diabetes Father   . Heart attack Sister   . Hypertension Mother     Social History Social History  Substance Use Topics  . Smoking status: Current Every Day Smoker    Packs/day: 0.50    Types: Cigarettes  . Smokeless tobacco: Never Used  . Alcohol use No     Comment: sts he hasn't drunk in 6 months      Allergies   Patient has no known allergies.   Review of Systems Review of Systems ROS reviewed and all are negative for acute change except as noted in the HPI.  Physical Exam Updated Vital Signs BP 161/98 (BP Location: Right Arm)   Pulse 105   Temp 97.7 F (36.5 C) (Oral)   Resp 18  SpO2 96%   Physical Exam  Constitutional: He is oriented to person, place, and time. Vital signs are normal. He appears well-developed and well-nourished.  Well appearing. NAD  HENT:  Head: Normocephalic and atraumatic.  Right Ear: Hearing normal.  Left Ear: Hearing normal.  Eyes: Conjunctivae and EOM are normal. Pupils are equal, round, and reactive to light.  Neck: Normal range of motion. Neck supple.  Cardiovascular: Regular rhythm, normal heart sounds and intact distal pulses.  Tachycardia present.   Pulmonary/Chest: Effort normal. Tachypnea noted. No respiratory distress. He has decreased breath sounds in the right upper field, the right lower field, the left upper field and the left lower field.  Abdominal: There is no tenderness.  Musculoskeletal: Normal range of motion.  Neurological: He is alert and oriented to person, place, and time.  Skin: Skin is warm and dry.  Psychiatric: He has a normal mood and affect. His speech is normal and behavior is normal. Thought content normal.  Nursing note and vitals reviewed.  ED Treatments / Results  Labs (all labs ordered are listed, but only abnormal results are displayed) Labs Reviewed  BASIC METABOLIC PANEL - Abnormal; Notable for the following:       Result Value   Sodium 129 (*)    Chloride 97  (*)    Glucose, Bld 274 (*)    Calcium 8.1 (*)    All other components within normal limits  BRAIN NATRIURETIC PEPTIDE - Abnormal; Notable for the following:    B Natriuretic Peptide 1,828.2 (*)    All other components within normal limits  CBC  I-STAT TROPOININ, ED   EKG  EKG Interpretation  Date/Time:  Sunday April 10 2016 20:44:57 EST Ventricular Rate:  103 PR Interval:    QRS Duration: 92 QT Interval:  364 QTC Calculation: 477 R Axis:   64 Text Interpretation:  Sinus tachycardia Paired ventricular premature complexes Probable left atrial enlargement LVH with secondary repolarization abnormality Anterior ST elevation, probably due to LVH Borderline prolonged QT interval Confirmed by Adriana Simas  MD, BRIAN ([id number]) on 04/10/2016 8:58:40 PM      Radiology Dg Chest 2 View  Result Date: 04/10/2016 CLINICAL DATA:  Shortness of breath and chest pain EXAM: CHEST  2 VIEW COMPARISON:  Chest radiograph 09/24/2015 FINDINGS: Cardiomediastinal contours are unchanged. There are bilateral small pleural effusions with associated atelectasis. No focal airspace consolidation. Mildly increased interstitial markings, possibly mild Pulmonary edema. No pneumothorax. IMPRESSION: Small bilateral pleural effusions with associated atelectasis. Possible mild interstitial pulmonary edema. Electronically Signed   By: Deatra Robinson M.D.   On: 04/10/2016 21:20    Procedures Procedures (including critical care time)  Medications Ordered in ED Medications  albuterol (PROVENTIL) (2.5 MG/3ML) 0.083% nebulizer solution 5 mg (5 mg Nebulization Given 04/10/16 2216)  furosemide (LASIX) injection 40 mg (40 mg Intravenous Given 04/10/16 2216)   Initial Impression / Assessment and Plan / ED Course  I have reviewed the triage vital signs and the nursing notes.  Pertinent labs & imaging results that were available during my care of the patient were reviewed by me and considered in my medical decision making (see chart  for details).  Clinical Course    Final Clinical Impressions(s) / ED Diagnoses  {I have reviewed and evaluated the relevant laboratory values. {I have reviewed and evaluated the relevant imaging studies.  {I have reviewed the relevant previous healthcare records.  {I obtained HPI from historian. {Patient discussed with supervising physician.  ED Course:  Assessment: Pt is  a 60yM with hx CHF, CAD, DM2 who presents with shortness of breath. Did not take lasix on vacation and did not take for several days. SOB x 2 days. Worse wwhile lying prone and with exertion. On exam, pt in NAD. Nontoxic/nonseptic appearing. VS with tachycardia. Afebrile. Lungs diminished breath sounds bilaterally. Abdomen nontender soft. CBC unremarkable. BMP with hyperglycemia. No gap. Trop negative. BNP 1,800 EKG unremarkable. CXR shows small bilateral pleural effusions as well as mild interstitial pulmonary edema. Given 40 IV Lasix in ED. Likely mild CHF exacerbation.Ambulated patient personally with O2 saturations 88% on RA. Increased WOB. Will Admit to Obs  Disposition/Plan:  Admit Pt acknowledges and agrees with plan  Supervising Physician Donnetta Hutching, MD  Final diagnoses:  Acute on chronic congestive heart failure, unspecified congestive heart failure type [facility])  Hyperglycemia    New Prescriptions New Prescriptions   No medications on file     Audry Pili, PA-C 04/11/16 0047    Audry Pili, PA-C 04/11/16 0048    Audry Pili, PA-C 04/11/16 4098    Donnetta Hutching, MD 04/15/16 [phone]

## 2016-04-10 NOTE — ED Triage Notes (Signed)
Pt BIB [facility] with complaint of shortness of breath.  Pt has been traveling for a few days and didn't take his lasix with him.  Complaints of shortness of breath when lying down and then anxiety.  States in the past he was treated for his anxiety and given lasix when he has these symptoms.  Pt has a CBG currently of 384 which he states he abnormally high for him.  History of CHF, anxiety, and diabetes.

## 2016-04-10 NOTE — ED Notes (Addendum)
Pt is in no distress at this time. Lung sounds clear. No shortness of breath while sitting up in bed.  No cough present.  No chest pain.

## 2016-04-11 ENCOUNTER — Observation Stay (HOSPITAL_BASED_OUTPATIENT_CLINIC_OR_DEPARTMENT_OTHER): Payer: Medicare Other

## 2016-04-11 DIAGNOSIS — E1100 Type 2 diabetes mellitus with hyperosmolarity without nonketotic hyperglycemic-hyperosmolar coma (NKHHC): Secondary | ICD-10-CM | POA: Diagnosis not present

## 2016-04-11 DIAGNOSIS — I428 Other cardiomyopathies: Secondary | ICD-10-CM

## 2016-04-11 DIAGNOSIS — I5023 Acute on chronic systolic (congestive) heart failure: Secondary | ICD-10-CM | POA: Diagnosis not present

## 2016-04-11 DIAGNOSIS — I509 Heart failure, unspecified: Secondary | ICD-10-CM | POA: Diagnosis not present

## 2016-04-11 DIAGNOSIS — F1010 Alcohol abuse, uncomplicated: Secondary | ICD-10-CM | POA: Diagnosis not present

## 2016-04-11 LAB — BASIC METABOLIC PANEL
Anion gap: 11 (ref 5–15)
BUN: 12 mg/dL (ref 6–20)
CHLORIDE: 100 mmol/L — AB (ref 101–111)
CO2: 24 mmol/L (ref 22–32)
CREATININE: 1.04 mg/dL (ref 0.61–1.24)
Calcium: 8.2 mg/dL — ABNORMAL LOW (ref 8.9–10.3)
GFR calc Af Amer: >60 mL/min (ref >60)
GFR calc non Af Amer: >60 mL/min (ref >60)
Glucose, Bld: 238 mg/dL — ABNORMAL HIGH (ref 65–99)
POTASSIUM: 3.8 mmol/L (ref 3.5–5.1)
SODIUM: 135 mmol/L (ref 135–145)

## 2016-04-11 LAB — ECHOCARDIOGRAM COMPLETE
Height: 74 in
WEIGHTICAEL: 2539.2 [oz_av]

## 2016-04-11 LAB — HEPATIC FUNCTION PANEL
ALK PHOS: 79 U/L (ref 38–126)
ALT: 35 U/L (ref 17–63)
AST: 60 U/L — AB (ref 15–41)
Albumin: 2.4 g/dL — ABNORMAL LOW (ref 3.5–5.0)
BILIRUBIN DIRECT: 0.4 mg/dL (ref 0.1–0.5)
BILIRUBIN INDIRECT: 1.3 mg/dL — AB (ref 0.3–0.9)
BILIRUBIN TOTAL: 1.7 mg/dL — AB (ref 0.3–1.2)
Total Protein: 6.4 g/dL — ABNORMAL LOW (ref 6.5–8.1)

## 2016-04-11 LAB — RAPID URINE DRUG SCREEN, HOSP PERFORMED
AMPHETAMINES: NOT DETECTED
BARBITURATES: NOT DETECTED
Benzodiazepines: NOT DETECTED
COCAINE: POSITIVE — AB
OPIATES: NOT DETECTED
TETRAHYDROCANNABINOL: NOT DETECTED

## 2016-04-11 LAB — BRAIN NATRIURETIC PEPTIDE: B NATRIURETIC PEPTIDE 5: 1828.2 pg/mL — AB (ref 0.0–100.0)

## 2016-04-11 LAB — GLUCOSE, CAPILLARY
GLUCOSE-CAPILLARY: 239 mg/dL — AB (ref 65–99)
Glucose-Capillary: 428 mg/dL — ABNORMAL HIGH (ref 65–99)
Glucose-Capillary: 198 mg/dL — ABNORMAL HIGH (ref 65–99)
Glucose-Capillary: 263 mg/dL — ABNORMAL HIGH (ref 65–99)
Glucose-Capillary: 394 mg/dL — ABNORMAL HIGH (ref 65–99)

## 2016-04-11 LAB — MAGNESIUM: Magnesium: 1.6 mg/dL — ABNORMAL LOW (ref 1.7–2.4)

## 2016-04-11 MED ORDER — VITAMIN B-1 100 MG PO TABS: 100.0000 mg | ORAL_TABLET | Freq: Every day | ORAL | Status: DC

## 2016-04-11 MED ORDER — PERFLUTREN LIPID MICROSPHERE
INTRAVENOUS | Status: AC
  Filled 2016-04-11: qty 10

## 2016-04-11 MED ORDER — INSULIN ASPART 100 UNIT/ML ~~LOC~~ SOLN: 0.0000 [IU] | Freq: Three times a day (TID) | SUBCUTANEOUS | Status: DC

## 2016-04-11 MED ORDER — TRAZODONE HCL 150 MG PO TABS
150.0000 mg | ORAL_TABLET | Freq: Every evening | ORAL | Status: DC | PRN
  Administered 2016-04-11 – 2016-04-12 (×3): 150 mg via ORAL
  Filled 2016-04-11 (×3): qty 1

## 2016-04-11 MED ORDER — INSULIN ASPART 100 UNIT/ML ~~LOC~~ SOLN
0.0000 [IU] | Freq: Three times a day (TID) | SUBCUTANEOUS | Status: DC
  Administered 2016-04-11: 5 [IU] via SUBCUTANEOUS
  Administered 2016-04-11: 8 [IU] via SUBCUTANEOUS
  Administered 2016-04-11: 3 [IU] via SUBCUTANEOUS
  Administered 2016-04-12: 2 [IU] via SUBCUTANEOUS
  Administered 2016-04-12 (×2): 8 [IU] via SUBCUTANEOUS
  Administered 2016-04-13 (×2): 5 [IU] via SUBCUTANEOUS
  Administered 2016-04-13 (×2): 3 [IU] via SUBCUTANEOUS
  Administered 2016-04-14: 2 [IU] via SUBCUTANEOUS
  Administered 2016-04-14: 5 [IU] via SUBCUTANEOUS

## 2016-04-11 MED ORDER — ADULT MULTIVITAMIN W/MINERALS CH: 1.0000 | ORAL_TABLET | Freq: Every day | ORAL | Status: DC

## 2016-04-11 MED ORDER — HYDROXYZINE HCL 25 MG PO TABS
25.0000 mg | ORAL_TABLET | Freq: Three times a day (TID) | ORAL | Status: DC | PRN
  Filled 2016-04-11: qty 1

## 2016-04-11 MED ORDER — GLUCERNA SHAKE PO LIQD
237.0000 mL | Freq: Three times a day (TID) | ORAL | Status: DC
  Administered 2016-04-11 – 2016-04-14 (×7): 237 mL via ORAL

## 2016-04-11 MED ORDER — FUROSEMIDE 10 MG/ML IJ SOLN
40.0000 mg | Freq: Every day | INTRAMUSCULAR | Status: DC
  Administered 2016-04-11: 40 mg via INTRAVENOUS
  Filled 2016-04-11: qty 4

## 2016-04-11 MED ORDER — LORAZEPAM 1 MG PO TABS
1.0000 mg | ORAL_TABLET | Freq: Four times a day (QID) | ORAL | Status: AC | PRN
Start: 2016-04-11 — End: 2016-04-14

## 2016-04-11 MED ORDER — ONDANSETRON HCL 4 MG/2ML IJ SOLN
4.0000 mg | Freq: Four times a day (QID) | INTRAMUSCULAR | Status: DC | PRN
Start: 2016-04-11 — End: 2016-04-14

## 2016-04-11 MED ORDER — BUPROPION HCL 100 MG PO TABS
100.0000 mg | ORAL_TABLET | Freq: Every day | ORAL | Status: DC
  Administered 2016-04-11 – 2016-04-13 (×4): 100 mg via ORAL
  Filled 2016-04-11 (×4): qty 1

## 2016-04-11 MED ORDER — LISINOPRIL 10 MG PO TABS
10.0000 mg | ORAL_TABLET | Freq: Every day | ORAL | Status: DC
  Administered 2016-04-11 – 2016-04-14 (×4): 10 mg via ORAL
  Filled 2016-04-11 (×4): qty 1

## 2016-04-11 MED ORDER — LORAZEPAM 2 MG/ML IJ SOLN: 1.0000 mg | Freq: Four times a day (QID) | INTRAMUSCULAR | Status: AC | PRN

## 2016-04-11 MED ORDER — SODIUM CHLORIDE 0.9 % IV SOLN: 250.0000 mL | INTRAVENOUS | Status: DC | PRN

## 2016-04-11 MED ORDER — SPIRONOLACTONE 25 MG PO TABS
25.0000 mg | ORAL_TABLET | Freq: Every day | ORAL | Status: DC
  Administered 2016-04-11 – 2016-04-14 (×4): 25 mg via ORAL
  Filled 2016-04-11 (×4): qty 1

## 2016-04-11 MED ORDER — POTASSIUM CHLORIDE CRYS ER 10 MEQ PO TBCR
10.0000 meq | EXTENDED_RELEASE_TABLET | Freq: Every day | ORAL | Status: DC
  Administered 2016-04-11 – 2016-04-14 (×4): 10 meq via ORAL
  Filled 2016-04-11 (×4): qty 1

## 2016-04-11 MED ORDER — ACETAMINOPHEN 325 MG PO TABS
650.0000 mg | ORAL_TABLET | ORAL | Status: DC | PRN
  Filled 2016-04-11 (×2): qty 2

## 2016-04-11 MED ORDER — INSULIN ASPART 100 UNIT/ML ~~LOC~~ SOLN
8.0000 [IU] | Freq: Once | SUBCUTANEOUS | Status: AC
  Administered 2016-04-11: 8 [IU] via SUBCUTANEOUS

## 2016-04-11 MED ORDER — FOLIC ACID 1 MG PO TABS: 1.0000 mg | ORAL_TABLET | Freq: Every day | ORAL | Status: DC

## 2016-04-11 MED ORDER — ENSURE ENLIVE PO LIQD: 237.0000 mL | Freq: Two times a day (BID) | ORAL | Status: DC

## 2016-04-11 MED ORDER — SERTRALINE HCL 100 MG PO TABS
100.0000 mg | ORAL_TABLET | Freq: Every day | ORAL | Status: DC
  Administered 2016-04-11 – 2016-04-14 (×4): 100 mg via ORAL
  Filled 2016-04-11 (×4): qty 1

## 2016-04-11 MED ORDER — VITAMIN B-1 100 MG PO TABS
100.0000 mg | ORAL_TABLET | Freq: Every day | ORAL | Status: DC
  Administered 2016-04-11 – 2016-04-14 (×4): 100 mg via ORAL
  Filled 2016-04-11 (×4): qty 1

## 2016-04-11 MED ORDER — ASPIRIN 81 MG PO CHEW
81.0000 mg | CHEWABLE_TABLET | Freq: Every day | ORAL | Status: DC
  Administered 2016-04-11 – 2016-04-14 (×4): 81 mg via ORAL
  Filled 2016-04-11 (×4): qty 1

## 2016-04-11 MED ORDER — MAGNESIUM OXIDE 400 (241.3 MG) MG PO TABS
400.0000 mg | ORAL_TABLET | Freq: Every day | ORAL | Status: DC
  Administered 2016-04-11 – 2016-04-14 (×4): 400 mg via ORAL
  Filled 2016-04-11 (×4): qty 1

## 2016-04-11 MED ORDER — ENOXAPARIN SODIUM 40 MG/0.4ML ~~LOC~~ SOLN
40.0000 mg | SUBCUTANEOUS | Status: DC
  Administered 2016-04-11 – 2016-04-12 (×2): 40 mg via SUBCUTANEOUS
  Filled 2016-04-11 (×3): qty 0.4

## 2016-04-11 MED ORDER — FOLIC ACID 1 MG PO TABS
1.0000 mg | ORAL_TABLET | Freq: Every day | ORAL | Status: DC
  Administered 2016-04-11 – 2016-04-14 (×4): 1 mg via ORAL
  Filled 2016-04-11 (×4): qty 1

## 2016-04-11 MED ORDER — GUAIFENESIN ER 600 MG PO TB12
600.0000 mg | ORAL_TABLET | Freq: Two times a day (BID) | ORAL | Status: DC
  Administered 2016-04-11 – 2016-04-14 (×8): 600 mg via ORAL
  Filled 2016-04-11 (×8): qty 1

## 2016-04-11 MED ORDER — PNEUMOCOCCAL VAC POLYVALENT 25 MCG/0.5ML IJ INJ
0.5000 mL | INJECTION | INTRAMUSCULAR | Status: AC
  Administered 2016-04-14: 0.5 mL via INTRAMUSCULAR
  Filled 2016-04-11 (×2): qty 0.5

## 2016-04-11 MED ORDER — SODIUM CHLORIDE 0.9% FLUSH: 3.0000 mL | INTRAVENOUS | Status: DC | PRN

## 2016-04-11 MED ORDER — SODIUM CHLORIDE 0.9% FLUSH
3.0000 mL | Freq: Two times a day (BID) | INTRAVENOUS | Status: DC
  Administered 2016-04-11 – 2016-04-14 (×8): 3 mL via INTRAVENOUS

## 2016-04-11 MED ORDER — PERFLUTREN LIPID MICROSPHERE
1.0000 mL | INTRAVENOUS | Status: AC | PRN
  Administered 2016-04-11: 2 mL via INTRAVENOUS
  Filled 2016-04-11: qty 10

## 2016-04-11 MED ORDER — INSULIN GLARGINE 100 UNIT/ML ~~LOC~~ SOLN
10.0000 [IU] | Freq: Every day | SUBCUTANEOUS | Status: DC
  Administered 2016-04-11 – 2016-04-12 (×2): 10 [IU] via SUBCUTANEOUS
  Filled 2016-04-11 (×2): qty 0.1

## 2016-04-11 MED ORDER — PRAVASTATIN SODIUM 20 MG PO TABS
10.0000 mg | ORAL_TABLET | Freq: Every day | ORAL | Status: DC
  Administered 2016-04-11 – 2016-04-13 (×3): 10 mg via ORAL
  Filled 2016-04-11 (×3): qty 1

## 2016-04-11 MED ORDER — INFLUENZA VAC SPLIT QUAD 0.5 ML IM SUSY
0.5000 mL | PREFILLED_SYRINGE | INTRAMUSCULAR | Status: AC
  Administered 2016-04-13: 0.5 mL via INTRAMUSCULAR
  Filled 2016-04-11: qty 0.5

## 2016-04-11 MED ORDER — FUROSEMIDE 40 MG PO TABS
40.0000 mg | ORAL_TABLET | Freq: Every day | ORAL | Status: DC
  Administered 2016-04-12: 40 mg via ORAL
  Filled 2016-04-11: qty 1

## 2016-04-11 MED ORDER — THIAMINE HCL 100 MG/ML IJ SOLN: 100.0000 mg | Freq: Every day | INTRAMUSCULAR | Status: DC

## 2016-04-11 MED ORDER — ADULT MULTIVITAMIN W/MINERALS CH
1.0000 | ORAL_TABLET | Freq: Every day | ORAL | Status: DC
  Administered 2016-04-11 – 2016-04-14 (×4): 1 via ORAL
  Filled 2016-04-11 (×4): qty 1

## 2016-04-11 MED ORDER — MAGNESIUM SULFATE 2 GM/50ML IV SOLN
2.0000 g | Freq: Once | INTRAVENOUS | Status: AC
  Administered 2016-04-11: 2 g via INTRAVENOUS
  Filled 2016-04-11: qty 50

## 2016-04-11 MED ORDER — METFORMIN HCL 500 MG PO TABS: 1000.0000 mg | ORAL_TABLET | Freq: Two times a day (BID) | ORAL | Status: DC

## 2016-04-11 NOTE — ED Notes (Signed)
Pt resting, dozing off, and O2 saturations are dropping to the 80s with sleep.  Placed on 2L via [place] for comfort and support.

## 2016-04-11 NOTE — H&P (Signed)
History and Physical    [name] ZOX:[id number] DOB: 28-Jul-1955 DOA: 04/10/2016   PCP: [facility] Chief Complaint:  Chief Complaint  Patient presents with  . Shortness of Breath    HPI: [name] is a 60 y.o. male with medical history significant of CHF with EF 25-30% at baseline.  Patient has missed all of his meds for the past 9 days due to being out of town.  He has developed slowly onset, worsening SOB, orthopnea, mild DOE, mild intermittent CP.  Symptoms onset 2 days ago, worsening, persistent.  ED Course: Work up suggestive of CHF. BNP 1800, CXR shows mild pulm edema.  Review of Systems: As per HPI otherwise 10 point review of systems negative.    Past Medical History:  Diagnosis Date  . CHF (congestive heart failure) (HCC)   . Coronary artery disease   . Depression   . Diabetes mellitus   . Hypertension   . Mental disorder   . MI (myocardial infarction) 75   age 69 per patient    Past Surgical History:  Procedure Laterality Date  . ANGIOPLASTY  1984   at age 89  . LEFT AND RIGHT HEART CATHETERIZATION WITH CORONARY/GRAFT ANGIOGRAM  08/13/2012   Procedure: LEFT AND RIGHT HEART CATHETERIZATION WITH [name];  Surgeon: Kathleene Hazel, MD;  Location: [facility] CATH LAB;  Service: Cardiovascular;;     reports that he has been smoking Cigarettes.  He has been smoking about 0.50 packs per day. He has never used smokeless tobacco. He reports that he uses drugs, including Cocaine and Marijuana, about 7 times per week. He reports that he does not drink alcohol.  No Known Allergies  Family History  Problem Relation Age of Onset  . Heart attack Father     >60s  . Hypertension Father   . Diabetes Father   . Heart attack Sister   . Hypertension Mother       Prior to Admission medications   Medication Sig Start Date End Date Taking? Authorizing Provider  aspirin 81 MG tablet Take 81 mg by mouth daily.    Historical Provider, MD   buPROPion (WELLBUTRIN) 100 MG tablet Take 100 mg by mouth at bedtime.     Historical Provider, MD  carvedilol (COREG) 3.125 MG tablet Take 15.625 mg by mouth 2 (two) times daily with a meal.    Historical Provider, MD  carvedilol (COREG) 6.25 MG tablet Take 1 tablet (6.25 mg total) by mouth 2 (two) times daily with a meal. 09/16/15   Jeralyn Bennett, MD  feeding supplement, GLUCERNA SHAKE, (GLUCERNA SHAKE) LIQD Take 237 mLs by mouth 3 (three) times daily between meals. 02/27/14   Maryruth Bun Rama, MD  folic acid (FOLVITE) 1 MG tablet Take 1 tablet (1 mg total) by mouth daily. 02/27/14   Maryruth Bun Rama, MD  furosemide (LASIX) 40 MG tablet Take 1 tablet (40 mg total) by mouth daily. 09/17/15   Jeralyn Bennett, MD  guaiFENesin (MUCINEX) 600 MG 12 hr tablet Take 1 tablet (600 mg total) by mouth 2 (two) times daily. 09/15/15   Albertine Grates, MD  hydrOXYzine (ATARAX/VISTARIL) 25 MG tablet Take 25 mg by mouth 3 (three) times daily as needed for anxiety or itching.    Historical Provider, MD  Insulin Glargine (LANTUS SOLOSTAR) 100 UNIT/ML Solostar Pen Inject 20 Units into the skin every morning. 09/16/15   Jeralyn Bennett, MD  lisinopril (PRINIVIL,ZESTRIL) 5 MG tablet Take 1 tablet (5 mg total) by mouth  daily. Patient taking differently: Take 10 mg by mouth daily.  09/16/15   Jeralyn Bennett, MD  magnesium oxide (MAG-OX) 400 (241.3 Mg) MG tablet Take 1 tablet (400 mg total) by mouth daily. 09/15/15   Albertine Grates, MD  metFORMIN (GLUCOPHAGE) 1000 MG tablet Take 0.5 tablets (500 mg total) by mouth 2 (two) times daily with a meal. For high blood sugar control Patient taking differently: Take 1,000 mg by mouth 2 (two) times daily with a meal. For high blood sugar control 12/27/12   Sanjuana Kava, NP  Multiple Vitamin (MULTIVITAMIN WITH MINERALS) TABS tablet Take 1 tablet by mouth daily. 02/27/14   Maryruth Bun Rama, MD  potassium chloride (K-DUR,KLOR-CON) 10 MEQ tablet Take 10 mEq by mouth daily.    Historical Provider, MD    pravastatin (PRAVACHOL) 10 MG tablet Take 1 tablet (10 mg total) by mouth daily. For high cholesterol control 09/16/15   Jeralyn Bennett, MD  sertraline (ZOLOFT) 100 MG tablet Take 100 mg by mouth daily.    Historical Provider, MD  spironolactone (ALDACTONE) 25 MG tablet Take 25 mg by mouth daily.    Historical Provider, MD  thiamine 100 MG tablet Take 1 tablet (100 mg total) by mouth daily. 02/27/14   Maryruth Bun Rama, MD  traZODone (DESYREL) 100 MG tablet Take 1 tablet (100 mg total) by mouth at bedtime. For sleep Patient taking differently: Take 150 mg by mouth at bedtime. For sleep 12/27/12   Sanjuana Kava, NP    Physical Exam: Vitals:   04/10/16 2315 04/10/16 2330 04/10/16 2345 04/11/16 0030  BP: 173/95 162/84 154/94 (!) 151/105  Pulse: 110 111 109 116  Resp:      Temp:      TempSrc:      SpO2: 91% 92% 95% 90%      Constitutional: NAD, calm, comfortable Eyes: PERRL, lids and conjunctivae normal ENMT: Mucous membranes are moist. Posterior pharynx clear of any exudate or lesions.Normal dentition.  Neck: normal, supple, no masses, no thyromegaly Respiratory: clear to auscultation bilaterally, no wheezing, no crackles. Normal respiratory effort. No accessory muscle use.  Cardiovascular: Regular rate and rhythm, no murmurs / rubs / gallops. No extremity edema. 2+ pedal pulses. No carotid bruits.  Abdomen: no tenderness, no masses palpated. No hepatosplenomegaly. Bowel sounds positive.  Musculoskeletal: no clubbing / cyanosis. No joint deformity upper and lower extremities. Good ROM, no contractures. Normal muscle tone.  Skin: no rashes, lesions, ulcers. No induration Neurologic: CN 2-12 grossly intact. Sensation intact, DTR normal. Strength 5/5 in all 4.  Psychiatric: Normal judgment and insight. Alert and oriented x 3. Normal mood.    Labs on Admission: I have personally reviewed following labs and imaging studies  CBC:  Recent Labs Lab 04/10/16 2203  WBC 5.9  HGB 15.2  HCT  42.2  MCV 83.6  PLT 222   Basic Metabolic Panel:  Recent Labs Lab 04/10/16 2203  NA 129*  K 3.9  CL 97*  CO2 22  GLUCOSE 274*  BUN 7  CREATININE 0.89  CALCIUM 8.1*   GFR: CrCl cannot be calculated (Unknown ideal weight.). Liver Function Tests: No results for input(s): AST, ALT, ALKPHOS, BILITOT, PROT, ALBUMIN in the last 168 hours. No results for input(s): LIPASE, AMYLASE in the last 168 hours. No results for input(s): AMMONIA in the last 168 hours. Coagulation Profile: No results for input(s): INR, PROTIME in the last 168 hours. Cardiac Enzymes: No results for input(s): CKTOTAL, CKMB, CKMBINDEX, TROPONINI in the last 168 hours.  BNP (last 3 results) No results for input(s): PROBNP in the last 8760 hours. HbA1C: No results for input(s): HGBA1C in the last 72 hours. CBG: No results for input(s): GLUCAP in the last 168 hours. Lipid Profile: No results for input(s): CHOL, HDL, LDLCALC, TRIG, CHOLHDL, LDLDIRECT in the last 72 hours. Thyroid Function Tests: No results for input(s): TSH, T4TOTAL, FREET4, T3FREE, THYROIDAB in the last 72 hours. Anemia Panel: No results for input(s): VITAMINB12, FOLATE, FERRITIN, TIBC, IRON, RETICCTPCT in the last 72 hours. Urine analysis:    Component Value Date/Time   COLORURINE YELLOW 12/25/2015 1210   APPEARANCEUR CLEAR 12/25/2015 1210   LABSPEC 1.015 12/25/2015 1210   PHURINE 5.5 12/25/2015 1210   GLUCOSEU 500 (A) 12/25/2015 1210   HGBUR NEGATIVE 12/25/2015 1210   BILIRUBINUR NEGATIVE 12/25/2015 1210   KETONESUR NEGATIVE 12/25/2015 1210   PROTEINUR NEGATIVE 12/25/2015 1210   UROBILINOGEN 2.0 (H) 02/20/2014 1339   NITRITE NEGATIVE 12/25/2015 1210   LEUKOCYTESUR NEGATIVE 12/25/2015 1210   Sepsis Labs: @LABRCNTIP (procalcitonin:4,lacticidven:4) )No results found for this or any previous visit (from the past 240 hour(s)).   Radiological Exams on Admission: Dg Chest 2 View  Result Date: 04/10/2016 CLINICAL DATA:  Shortness of  breath and chest pain EXAM: CHEST  2 VIEW COMPARISON:  Chest radiograph 09/24/2015 FINDINGS: Cardiomediastinal contours are unchanged. There are bilateral small pleural effusions with associated atelectasis. No focal airspace consolidation. Mildly increased interstitial markings, possibly mild Pulmonary edema. No pneumothorax. IMPRESSION: Small bilateral pleural effusions with associated atelectasis. Possible mild interstitial pulmonary edema. Electronically Signed   By: Deatra RobinsonKevin  Herman M.D.   On: 04/10/2016 21:20    EKG: Independently reviewed.  Assessment/Plan Principal Problem:   Acute on chronic systolic CHF (congestive heart failure) (HCC) Active Problems:   Diabetes mellitus, type II (HCC)   Cardiomyopathy, nonischemic (HCC)   ETOH abuse    1. Acute on chronic systolic CHF - 1. Due to not taking meds for past 9 days 2. Lasix 40mg  IV daily, first dose in ED 3. Resume home meds 4. [facility]g however 5. Not repeating echo at this time given the history 2. DM2 - 1. Resume home metformin 2. Mod scale SSI AC TID 3. EtOH abuse - Patient says "yes" when I ask him about recent drinking and "yes" when I ask about concerns about possible withdrawal 1. Putting patient on CIWA 2. Suspicious that this may have been related to the "a lot going on" over the past 9 days where he wasn't taking meds, but I just thanked him for being honest and didn't press him further.   DVT prophylaxis: Lovenox Code Status: Full code for now (patient states he "cant remember") Family Communication: No family in room Consults called: None Admission status: Admit to obs   Hillary BowGARDNER, Yanelly Cantrelle M. DO [facility] Pager [phone] from 7PM-7AM  If 7AM-7PM, please contact the day physician for the patient [email or web address] Password [email or web address]  04/11/2016, 1:20 AM

## 2016-04-11 NOTE — Progress Notes (Signed)
Initial Nutrition Assessment   INTERVENTION:  Continue Glucerna Shake po TID, each supplement provides 220 kcal and 10 grams of protein  F/up tomorrow for full assessment  NUTRITION DIAGNOSIS:   Predicted suboptimal nutrient intake related to poor appetite as evidenced by per patient/family report, percent weight loss.   GOAL:   Patient will meet greater than or equal to 90% of their needs   MONITOR:   PO intake, Supplement acceptance, Labs, Skin, Weight trends  REASON FOR ASSESSMENT:   Malnutrition Screening Tool    ASSESSMENT:   60 y.o. male with medical history significant of CHF with EF 25-30% at baseline.  Patient has missed all of his meds for the past 9 days due to being out of town.  He has developed slowly onset, worsening SOB, orthopnea, mild DOE, mild intermittent CP.  Symptoms onset 2 days ago, worsening, persistent.  Bedside procedure in progress at time of RD visit. Per Malnutrition Screening Tool, pt reported eating poorly due to decreased appetite and to losing weight recently without trying. Weight history shows pt has lost 9 lbs in the past 2 months (5% wt loss). Per nursing notes, pt ate 50% of breakfast and 75% of lunch today.   Low: low calcium, elevated glucose  Diet Order:  Diet heart healthy/carb modified Room service appropriate? Yes; Fluid consistency: Thin  Skin:  Reviewed, no issues  Last BM:  PTA  Height:   Ht Readings from Last 1 Encounters:  04/11/16 6\' 2"  (1.88 m)    Weight:   Wt Readings from Last 1 Encounters:  04/11/16 158 lb 11.2 oz (72 kg)    Ideal Body Weight:  86.4 kg  BMI:  Body mass index is 20.38 kg/m.  Estimated Nutritional Needs:   Kcal:  2000-2200  Protein:  85-95 grams  Fluid:  2-2.2 L/day  EDUCATION NEEDS:   No education needs identified at this time  Dorothea Ogle RD, CSP, LDN Inpatient Clinical Dietitian Pager: [phone] After Hours Pager: [phone]

## 2016-04-11 NOTE — Progress Notes (Signed)
PROGRESS NOTE  [name] MLJ:[id number] DOB: [birth year] DOA: 04/10/2016 [facility]  Brief History:  60 year old male with a history of systolic CHF, possibly substance abuse, coronary artery disease, hypertension, diabetes mellitus, insulin-dependent presented with 2 day history of worsening shortness breath. Apparently, the patient has missed all of his medications for the past 9-10 days when he was out of town. The patient complained of increasing orthopnea and dyspnea on exertion with intermittent chest discomfort. Further history reveals that he has been using crack cocaine which she states was approximately 1 week ago. He continues to smoke tobacco and drinks alcohol although he is not forthcoming regarding the amount of alcohol he drinks.  Presentation per chest x-ray showed interstitial edema with small bilateral pleural effusions. The patient was started on intravenous furosemide for suspected acute on chronic systolic CHF.  Assessment/Plan: Acute on chronic systolic CHF -Continue intravenous furosemide -Patient still has JVD on clinical examination -09/10/2015 echo EF 25-30%, PA SP 40 precipitated by noncompliance in the setting of crack cocaine use  Polysubstance abuse -Etoh withdraw protocol -UDS -HIV antbody  IDDM with renal and vascular palpitations -Continue reduced dose Lantus -Hemoglobin A1c -Discontinue metformin -09/10/15 A1C--11.7  HTN -continue lisinopril -anticipate improvement with diuresis  Depression -continue zoloft, wellbutrin     Disposition Plan:   Home in 1-2 days  Family Communication:   No Family at bedside--Total time spent 32 minutes.  Greater than 50% spent face to face counseling and coordinating care.  1000 to 1032   Consultants:  none  Code Status:  FULL   DVT Prophylaxis:  [place] Heparin / [place] Lovenox   Procedures: As Listed in Progress Note  Above  Antibiotics: None    Subjective:   Objective: Vitals:   04/10/16 2330 04/10/16 2345 04/11/16 0030 04/11/16 0220  BP: 162/84 154/94 (!) 151/105 (!) 143/74  Pulse: 111 109 116 100  Resp:    20  Temp:    97.7 F (36.5 C)  TempSrc:    Oral  SpO2: 92% 95% 90% 96%  Weight:    72 kg (158 lb 11.2 oz)  Height:    6\' 2"  (1.88 m)    Intake/Output Summary (Last 24 hours) at 04/11/16 1023 Last data filed at 04/11/16 0842  Gross per 24 hour  Intake              480 ml  Output                0 ml  Net              480 ml   Weight change:  Exam:   General:  Pt is alert, follows commands appropriately, not in acute distress  HEENT: No icterus, No thrush, No neck mass, [place]/AT  Cardiovascular: RRR, S1/S2, no rubs, no gallops  Respiratory: CTA bilaterally, no wheezing, no crackles, no rhonchi  Abdomen: Soft/+BS, non tender, non distended, no guarding  Extremities: No edema, No lymphangitis, No petechiae, No rashes, no synovitis   Data Reviewed: I have personally reviewed following labs and imaging studies Basic Metabolic Panel:  Recent Labs Lab 04/10/16 2203  NA 129*  K 3.9  CL 97*  CO2 22  GLUCOSE 274*  BUN 7  CREATININE 0.89  CALCIUM 8.1*   Liver Function Tests: No results for input(s): AST, ALT, ALKPHOS, BILITOT, PROT, ALBUMIN in the last 168 hours. No results for input(s): LIPASE, AMYLASE in the last 168  hours. No results for input(s): AMMONIA in the last 168 hours. Coagulation Profile: No results for input(s): INR, PROTIME in the last 168 hours. CBC:  Recent Labs Lab 04/10/16 2203  WBC 5.9  HGB 15.2  HCT 42.2  MCV 83.6  PLT 222   Cardiac Enzymes: No results for input(s): CKTOTAL, CKMB, CKMBINDEX, TROPONINI in the last 168 hours. BNP: Invalid input(s): POCBNP CBG:  Recent Labs Lab 04/11/16 0235 04/11/16 0619  GLUCAP 428* 198*   HbA1C: No results for input(s): HGBA1C in the last 72 hours. Urine analysis:    Component Value  Date/Time   COLORURINE YELLOW 12/25/2015 1210   APPEARANCEUR CLEAR 12/25/2015 1210   LABSPEC 1.015 12/25/2015 1210   PHURINE 5.5 12/25/2015 1210   GLUCOSEU 500 (A) 12/25/2015 1210   HGBUR NEGATIVE 12/25/2015 1210   BILIRUBINUR NEGATIVE 12/25/2015 1210   KETONESUR NEGATIVE 12/25/2015 1210   PROTEINUR NEGATIVE 12/25/2015 1210   UROBILINOGEN 2.0 (H) 02/20/2014 1339   NITRITE NEGATIVE 12/25/2015 1210   LEUKOCYTESUR NEGATIVE 12/25/2015 1210   Sepsis Labs: @LABRCNTIP (procalcitonin:4,lacticidven:4) )No results found for this or any previous visit (from the past 240 hour(s)).   Scheduled Meds: . aspirin  81 mg Oral Daily  . buPROPion  100 mg Oral QHS  . enoxaparin (LOVENOX) injection  40 mg Subcutaneous Q24H  . feeding supplement (ENSURE ENLIVE)  237 mL Oral BID BM  . feeding supplement (GLUCERNA SHAKE)  237 mL Oral TID BM  . folic acid  1 mg Oral Daily  . furosemide  40 mg Intravenous Daily  . guaiFENesin  600 mg Oral BID  . [START ON 04/12/2016] Influenza vac split quadrivalent PF  0.5 mL Intramuscular Tomorrow-1000  . insulin aspart  0-15 Units Subcutaneous TID WC  . lisinopril  10 mg Oral Daily  . magnesium oxide  400 mg Oral Daily  . multivitamin with minerals  1 tablet Oral Daily  . [START ON 04/12/2016] pneumococcal 23 valent vaccine  0.5 mL Intramuscular Tomorrow-1000  . potassium chloride  10 mEq Oral Daily  . pravastatin  10 mg Oral q1800  . sertraline  100 mg Oral Daily  . sodium chloride flush  3 mL Intravenous Q12H  . spironolactone  25 mg Oral Daily  . thiamine  100 mg Oral Daily   Continuous Infusions:  Procedures/Studies: Dg Chest 2 View  Result Date: 04/10/2016 CLINICAL DATA:  Shortness of breath and chest pain EXAM: CHEST  2 VIEW COMPARISON:  Chest radiograph 09/24/2015 FINDINGS: Cardiomediastinal contours are unchanged. There are bilateral small pleural effusions with associated atelectasis. No focal airspace consolidation. Mildly increased interstitial  markings, possibly mild Pulmonary edema. No pneumothorax. IMPRESSION: Small bilateral pleural effusions with associated atelectasis. Possible mild interstitial pulmonary edema. Electronically Signed   By: Deatra RobinsonKevin  Herman M.D.   On: 04/10/2016 21:20    Sharifa Bucholz, DO  [facility] Pager [phone]  If 7PM-7AM, please contact night-coverage [email or web address] Password [email or web address] 04/11/2016, 10:23 AM   LOS: 0 days

## 2016-04-11 NOTE — Progress Notes (Signed)
  Echocardiogram 2D Echocardiogram has been performed.  [name] 04/11/2016, 4:03 PM

## 2016-04-11 NOTE — Progress Notes (Signed)
Inpatient Diabetes Program Recommendations  AACE/ADA: New Consensus Statement on Inpatient Glycemic Control (2015)  Target Ranges:  Prepandial:   less than 140 mg/dL      Peak postprandial:   less than 180 mg/dL (1-2 hours)      Critically ill patients:  140 - 180 mg/dL   Results for [name], [name] (MRN [id number]) as of 04/11/2016 10:17  Ref. Range 04/11/2016 02:35 04/11/2016 06:19  Glucose-Capillary Latest Ref Range: 65 - 99 mg/dL 947 (H) 096 (H)    Admit with: SOB  History: DM, CHF, ETOH Abuse  Home DM Meds: Lantus 20 units daily (Patient reports taking 1-2x per week if:If BG >200, will take 15-16 unitsIf BG >250, will take 30-40 units)       Metformin 1000 mg BID  Current Insulin Orders: Novolog Moderate Correction Scale/ SSI (0-15 units) TID AC        MD- Please consider the following in-hospital insulin adjustments:  1. Start Lantus 14 units daily (0.2 units/kg dosing based on weight of 72 kg)  Would start with weight based dose versus home dose since patient admits to not taking Lantus consistently at home)  2. Please check current Hemoglobin A1c level- Last one on file was 11.7% back on 09/10/15      --Will follow patient during hospitalization--  Ambrose Finland RN, MSN, CDE Diabetes Coordinator Inpatient Glycemic Control Team Team Pager: [phone] (8a-5p)

## 2016-04-11 NOTE — Clinical Social Work Note (Signed)
Clinical Social Work Assessment  Patient Details  Name: [name] MRN: [id number] Date of Birth: [birth year]  Date of referral:  04/11/16               Reason for consult:  Housing Concerns/Homelessness                Permission sought to share information with:    Permission granted to share information::  No  Name::        Agency::     Relationship::     Contact Information:     Housing/Transportation Living arrangements for the past 2 months:  Homeless Source of Information:  Patient, Medical Team Patient Interpreter Needed:  None Criminal Activity/Legal Involvement Pertinent to Current Situation/Hospitalization:  No - Comment as needed Significant Relationships:  Parents, Siblings Lives with:  Self Do you feel safe going back to the place where you live?  No Need for family participation in patient care:  Yes (Comment)  Care giving concerns:  Patient is homeless.   Social Worker assessment / plan:  CSW met with patient. No supports at bedside. CSW introduced role and inquired about housing concerns. Patient has been living in his car for 6 weeks. He receives services through the [facility] and he believes they are working on some resources for him. CSW provided resources for local shelters, food pantries and free meals, and community resources. [facility]. CSW called and left a Advertising account executive for [facility]. No further concerns. CSW encouraged patient to contact CSW as needed. CSW will continue to follow patient for support and await call back from [place].  Employment status:  Disabled (Comment on whether or not currently receiving Disability) Insurance information:  Medicare PT Recommendations:  Not assessed at this time Information / Referral to community resources:  Shelter, Other (Comment Required) [employer] resources, food resources)  Patient/Family's Response to care:  Patient agreeable to receiving resources. Patient's mother and sister supportive and  involved in patient's care. Patient appreciated social work intervention.  Patient/Family's Understanding of and Emotional Response to Diagnosis, Current Treatment, and Prognosis:  Patient understands reason for CSW consult. Patient appears happy with hospital care.  Emotional Assessment Appearance:  Appears stated age Attitude/Demeanor/Rapport:  Other (Pleasant) Affect (typically observed):  Accepting, Appropriate, Calm, Pleasant Orientation:  Oriented to Self, Oriented to Place, Oriented to  Time, Oriented to Situation Alcohol / Substance use:  Alcohol Use, Illicit Drugs Psych involvement (Current and /or in the community):  No (Comment)  Discharge Needs  Concerns to be addressed:  Homelessness Readmission within the last 30 days:  No Current discharge risk:  Inadequate Financial Supports, Homeless, Substance Abuse Barriers to Discharge:  Active Substance Use, Homeless with medical needs, Continued Medical Work up   Candie Chroman, LCSW 04/11/2016, 11:09 AM

## 2016-04-11 NOTE — Care Management Obs Status (Signed)
MEDICARE OBSERVATION STATUS NOTIFICATION   Patient Details  Name: [name] MRN: [id number] Date of Birth: 19-Sep-1955   Medicare Observation Status Notification Given:  Yes (explained OBS letter, offered to answer questions, patient declined to sign, pt given the original)    Antony Haste, RN 04/11/2016, 7:32 PM

## 2016-04-12 ENCOUNTER — Observation Stay (HOSPITAL_COMMUNITY): Payer: Medicare Other

## 2016-04-12 DIAGNOSIS — Z23 Encounter for immunization: Secondary | ICD-10-CM | POA: Diagnosis not present

## 2016-04-12 DIAGNOSIS — Z7982 Long term (current) use of aspirin: Secondary | ICD-10-CM | POA: Diagnosis not present

## 2016-04-12 DIAGNOSIS — E440 Moderate protein-calorie malnutrition: Secondary | ICD-10-CM | POA: Insufficient documentation

## 2016-04-12 DIAGNOSIS — I5043 Acute on chronic combined systolic (congestive) and diastolic (congestive) heart failure: Secondary | ICD-10-CM | POA: Diagnosis present

## 2016-04-12 DIAGNOSIS — F1910 Other psychoactive substance abuse, uncomplicated: Secondary | ICD-10-CM | POA: Diagnosis present

## 2016-04-12 DIAGNOSIS — I2510 Atherosclerotic heart disease of native coronary artery without angina pectoris: Secondary | ICD-10-CM | POA: Diagnosis present

## 2016-04-12 DIAGNOSIS — Z9861 Coronary angioplasty status: Secondary | ICD-10-CM | POA: Diagnosis not present

## 2016-04-12 DIAGNOSIS — I493 Ventricular premature depolarization: Secondary | ICD-10-CM | POA: Diagnosis present

## 2016-04-12 DIAGNOSIS — Z6820 Body mass index (BMI) 20.0-20.9, adult: Secondary | ICD-10-CM | POA: Diagnosis not present

## 2016-04-12 DIAGNOSIS — I509 Heart failure, unspecified: Secondary | ICD-10-CM

## 2016-04-12 DIAGNOSIS — I5023 Acute on chronic systolic (congestive) heart failure: Secondary | ICD-10-CM | POA: Diagnosis not present

## 2016-04-12 DIAGNOSIS — I110 Hypertensive heart disease with heart failure: Secondary | ICD-10-CM | POA: Diagnosis present

## 2016-04-12 DIAGNOSIS — Z794 Long term (current) use of insulin: Secondary | ICD-10-CM | POA: Diagnosis not present

## 2016-04-12 DIAGNOSIS — E1165 Type 2 diabetes mellitus with hyperglycemia: Secondary | ICD-10-CM | POA: Diagnosis present

## 2016-04-12 DIAGNOSIS — I252 Old myocardial infarction: Secondary | ICD-10-CM | POA: Diagnosis not present

## 2016-04-12 DIAGNOSIS — F329 Major depressive disorder, single episode, unspecified: Secondary | ICD-10-CM | POA: Diagnosis present

## 2016-04-12 DIAGNOSIS — I248 Other forms of acute ischemic heart disease: Secondary | ICD-10-CM | POA: Diagnosis present

## 2016-04-12 DIAGNOSIS — F1010 Alcohol abuse, uncomplicated: Secondary | ICD-10-CM | POA: Diagnosis not present

## 2016-04-12 DIAGNOSIS — I428 Other cardiomyopathies: Secondary | ICD-10-CM | POA: Diagnosis not present

## 2016-04-12 DIAGNOSIS — Z833 Family history of diabetes mellitus: Secondary | ICD-10-CM | POA: Diagnosis not present

## 2016-04-12 DIAGNOSIS — F17220 Nicotine dependence, chewing tobacco, uncomplicated: Secondary | ICD-10-CM | POA: Diagnosis present

## 2016-04-12 DIAGNOSIS — E1100 Type 2 diabetes mellitus with hyperosmolarity without nonketotic hyperglycemic-hyperosmolar coma (NKHHC): Secondary | ICD-10-CM | POA: Diagnosis not present

## 2016-04-12 DIAGNOSIS — Z8249 Family history of ischemic heart disease and other diseases of the circulatory system: Secondary | ICD-10-CM | POA: Diagnosis not present

## 2016-04-12 DIAGNOSIS — R0602 Shortness of breath: Secondary | ICD-10-CM | POA: Diagnosis not present

## 2016-04-12 DIAGNOSIS — Z79899 Other long term (current) drug therapy: Secondary | ICD-10-CM | POA: Diagnosis not present

## 2016-04-12 LAB — BASIC METABOLIC PANEL
Anion gap: 9 (ref 5–15)
BUN: 14 mg/dL (ref 6–20)
CALCIUM: 8 mg/dL — AB (ref 8.9–10.3)
CO2: 25 mmol/L (ref 22–32)
CREATININE: 0.98 mg/dL (ref 0.61–1.24)
Chloride: 99 mmol/L — ABNORMAL LOW (ref 101–111)
GFR calc Af Amer: >60 mL/min (ref >60)
GLUCOSE: 345 mg/dL — AB (ref 65–99)
POTASSIUM: 4.1 mmol/L (ref 3.5–5.1)
SODIUM: 133 mmol/L — AB (ref 135–145)

## 2016-04-12 LAB — HIV ANTIBODY (ROUTINE TESTING W REFLEX): HIV SCREEN 4TH GENERATION: NONREACTIVE

## 2016-04-12 LAB — GLUCOSE, CAPILLARY
GLUCOSE-CAPILLARY: 193 mg/dL — AB (ref 65–99)
Glucose-Capillary: 295 mg/dL — ABNORMAL HIGH (ref 65–99)
Glucose-Capillary: 300 mg/dL — ABNORMAL HIGH (ref 65–99)
Glucose-Capillary: 146 mg/dL — ABNORMAL HIGH (ref 65–99)

## 2016-04-12 LAB — MAGNESIUM: MAGNESIUM: 2.1 mg/dL (ref 1.7–2.4)

## 2016-04-12 MED ORDER — INSULIN GLARGINE 100 UNIT/ML ~~LOC~~ SOLN
20.0000 [IU] | Freq: Every day | SUBCUTANEOUS | Status: DC
  Administered 2016-04-13 – 2016-04-14 (×2): 20 [IU] via SUBCUTANEOUS
  Filled 2016-04-12 (×2): qty 0.2

## 2016-04-12 MED ORDER — CARVEDILOL 3.125 MG PO TABS
3.1250 mg | ORAL_TABLET | Freq: Two times a day (BID) | ORAL | Status: DC
  Administered 2016-04-12 – 2016-04-14 (×5): 3.125 mg via ORAL
  Filled 2016-04-12 (×4): qty 1

## 2016-04-12 MED ORDER — FUROSEMIDE 10 MG/ML IJ SOLN
40.0000 mg | Freq: Two times a day (BID) | INTRAMUSCULAR | Status: DC
  Administered 2016-04-12 – 2016-04-13 (×2): 40 mg via INTRAVENOUS
  Filled 2016-04-12 (×2): qty 4

## 2016-04-12 MED ORDER — INSULIN ASPART 100 UNIT/ML ~~LOC~~ SOLN
4.0000 [IU] | Freq: Three times a day (TID) | SUBCUTANEOUS | Status: DC
  Administered 2016-04-12 – 2016-04-14 (×5): 4 [IU] via SUBCUTANEOUS

## 2016-04-12 NOTE — Progress Notes (Signed)
Advance Beneficiary Notice of Noncoverage  (ABN) given as requested, all questions answered. Abelino Derrick [facility] [phone]

## 2016-04-12 NOTE — Consult Note (Signed)
Cardiology Consult    Patient ID: [name] MRN: [id number], DOB/AGE: 1955-06-22   Admit date: 04/10/2016 Date of Consult: 04/12/2016  Primary Physician: [facility] Primary Cardiologist: Dr. McLean/Dr. Clifton James Requesting Provider: Dr. 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  Past Medical History   Past Medical History:  Diagnosis Date  . CHF (congestive heart failure) (HCC)   . Coronary artery disease   . Depression   . Diabetes mellitus   . Hypertension   . Mental disorder   . MI (myocardial infarction) 53   age 9 per patient    Past Surgical History:  Procedure Laterality Date  . ANGIOPLASTY  1984   at age 36  . LEFT AND RIGHT HEART CATHETERIZATION WITH CORONARY/GRAFT ANGIOGRAM  08/13/2012   Procedure: LEFT AND RIGHT HEART CATHETERIZATION WITH [name];  Surgeon: Kathleene Hazel, MD;  Location: [facility] CATH LAB;  Service: Cardiovascular;;     Allergies  No Known Allergies  History of Present Illness    [name] is a 60 yo male with PMH of NICM/chronic combined S/D CHF (EF 30-35%/G3DD), HTN, IDDM, CAD and ongoing alcohol and cocaine abuse. He was admitted in 2014 with dyspnea and found to have segmental LV systolic dysfunction on ECHO. He was taken back for [facility] which showed mod non obst CAD and NICM.    He was last seen by cardiology in 5/17 while a [place] where he presented intoxicated with c/o shortness of breath. During that admission his BNP was 1076 and CX showed evidence of pulmonary edema. He was treated with IV lasix. Did require a thoracentesis that admission. Trop 0.08 felt to be consistent with demand ischemia. Echo that admission showed EF of 25-30%  Since that time he states continued use of cocaine and ETOH on a regular basis. Has been living  in his car recently. Normally gets his medications from the [place] in [place], but has not had them in over a week. Reports up until about a week ago he just didn't care about taking his medications. States over the past couple of days he has become increasingly short of breath. Unable to lay flat in the bed and has had a productive cough.    In the ED his labs showed BNP 1828, CXR with edema and small bilateral pleural effusions. Na+ 129, K+3.9, albumin 2.4, Trop 0.06, Hgb 15.2. EKG showed SR with notched p waves and PVCs. He was given IV lasix with good UOP thus far. Reports his breathing is better.   Inpatient Medications    . aspirin  81 mg Oral Daily  . buPROPion  100 mg Oral QHS  . carvedilol  3.125 mg Oral BID WC  . enoxaparin (LOVENOX) injection  40 mg Subcutaneous Q24H  . feeding supplement (GLUCERNA SHAKE)  237 mL Oral TID BM  . folic acid  1 mg Oral Daily  . furosemide  40 mg Oral Daily  . guaiFENesin  600 mg Oral BID  . Influenza vac split quadrivalent PF  0.5 mL Intramuscular Tomorrow-1000  . insulin aspart  0-15 Units Subcutaneous TID WC  . insulin glargine  10 Units Subcutaneous Daily  . lisinopril  10 mg Oral Daily  . magnesium oxide  400 mg Oral Daily  . multivitamin with minerals  1 tablet Oral Daily  .  pneumococcal 23 valent vaccine  0.5 mL Intramuscular Tomorrow-1000  . potassium chloride  10 mEq Oral Daily  . pravastatin  10 mg Oral q1800  . sertraline  100 mg Oral Daily  . sodium chloride flush  3 mL Intravenous Q12H  . spironolactone  25 mg Oral Daily  . thiamine  100 mg Oral Daily    Family History    Family History  Problem Relation Age of Onset  . Heart attack Father     >60s  . Hypertension Father   . Diabetes Father   . Heart attack Sister   . Hypertension Mother     Social History    Social History   Social History  . Marital status: Widowed    Spouse name: N/A  . Number of children: N/A  . Years of education: N/A   Occupational History    . Not on file.   Social History Main Topics  . Smoking status: Current Every Day Smoker    Packs/day: 0.50    Types: Cigarettes  . Smokeless tobacco: Never Used  . Alcohol use No     Comment: sts he hasn't drunk in 6 months   . Drug use:     Frequency: 7.0 times per week    Types: Cocaine, Marijuana     Comment: last use about 6 months ago  . Sexual activity: Yes   Other Topics Concern  . Not on file   Social History Narrative  . No narrative on file     Review of Systems    General:  No chills, fever, night sweats or weight changes.  Cardiovascular: See HPI Dermatological: No rash, lesions/masses Respiratory: No cough,++ dyspnea Urologic: No hematuria, dysuria Abdominal:   No nausea, vomiting, diarrhea, bright red blood per rectum, melena, or hematemesis Neurologic:  No visual changes, wkns, changes in mental status. All other systems reviewed and are otherwise negative except as noted above.  Physical Exam    Blood pressure 117/70, pulse 88, temperature 98 F (36.7 C), temperature source Oral, resp. rate 18, height 6\' 2"  (1.88 m), weight 160 lb 1.6 oz (72.6 kg), SpO2 96 %.  General: Pleasant older AA male, NAD Psych: Normal affect. Neuro: Alert and oriented X 3. Moves all extremities spontaneously. HEENT: Normal  Neck: Supple without bruits or JVD. Lungs:  Resp regular and unlabored, Diminished bilaterally. Heart: RRR no s3, s4, or murmurs. Abdomen: Soft, non-tender, non-distended, BS + x 4.  Extremities: No clubbing, cyanosis or edema. DP/PT/Radials 2+ and equal bilaterally.  Labs    Troponin [facility](Point of Care Test)  Recent Labs  04/10/16 2211  TROPIPOC 0.06   No results for input(s): CKTOTAL, CKMB, TROPONINI in the last 72 hours. Lab Results  Component Value Date   WBC 5.9 04/10/2016   HGB 15.2 04/10/2016   HCT 42.2 04/10/2016   MCV 83.6 04/10/2016   PLT 222 04/10/2016    Recent Labs Lab 04/11/16 1713 04/12/16 0337  NA  --  133*  K  --  4.1   CL  --  99*  CO2  --  25  BUN  --  14  CREATININE  --  0.98  CALCIUM  --  8.0*  PROT 6.4*  --   BILITOT 1.7*  --   ALKPHOS 79  --   ALT 35  --   AST 60*  --   GLUCOSE  --  345*   Lab Results  Component Value Date   CHOL 138 08/12/2012  HDL 43 08/12/2012   LDLCALC 81 08/12/2012   TRIG 72 08/12/2012   No results found for: [facility]   Radiology Studies    Dg Chest 2 View  Result Date: 04/10/2016 CLINICAL DATA:  Shortness of breath and chest pain EXAM: CHEST  2 VIEW COMPARISON:  Chest radiograph 09/24/2015 FINDINGS: Cardiomediastinal contours are unchanged. There are bilateral small pleural effusions with associated atelectasis. No focal airspace consolidation. Mildly increased interstitial markings, possibly mild Pulmonary edema. No pneumothorax. IMPRESSION: Small bilateral pleural effusions with associated atelectasis. Possible mild interstitial pulmonary edema. Electronically Signed   By: Deatra Robinson M.D.   On: 04/10/2016 21:20    ECG & Cardiac Imaging    EKG: SR with notched p waves, PVCs  Echo: 04/11/16  Study Conclusions  - Left ventricle: The cavity size was normal. Systolic function was   severely reduced. The estimated ejection fraction was in the   range of 20% to 25%. Severe diffuse hypokinesis with regional   variations. There is akinesis of the entireanteroseptal,   inferior, and inferoseptal myocardium. Features are consistent   with a pseudonormal left ventricular filling pattern, with   concomitant abnormal relaxation and increased filling pressure   (grade 2 diastolic dysfunction). - Mitral valve: There was mild regurgitation. - Left atrium: The atrium was mildly dilated. - Pulmonary arteries: Systolic pressure could not be accurately   estimated.  Assessment & Plan    60 yo male with PMH of NICM/chronic combined S/D CHF (EF 30-35%/G3DD), HTN, IDDM, CAD and ongoing alcohol and cocaine abuse who presented with dyspnea.  1. Acute on Chronic  combined HF: Has a hx of noncompliance with medications. States he has been out of his medications for over a week. Began to experience orthopnea, and dyspnea on exertion. States he does not weigh himself, or really comply with diet. Recently living in his car. CXR with edema and BNP elevated on admission. Echo this admission shows further decline in EF to 20-25%.  -- treated with IV lasix 40mg  with good UOP thus far. Would continue for now -- continue lisinopril, spiro, consider stopping BB as he was positive for cocaine on admission.   2. Chest pain: Denies any anginal symptoms. Trop 0.06. Last cath in 2014 showed mild nonobstructive CAD. Given ongoing ETOH and cocaine use would not pursue further work up.   3. HTN: initially elevated, but now improved back on medications.   4. IDDM: CBGs elevated, Hgb A1c 11.7 in may, pending this admission.   5. Polysubstance abuse: continues to use cocaine and drink heavily. Long term prognosis is poor if he continues this pattern. UDS was positive for cocaine on admission. Was on BB in the outpatient setting, but may need to dc given his cocaine use.   Janice Coffin, NP-C Pager [phone] 04/12/2016, 2:26 PM  As above, patient seen and examined. 60 yo male with PMH noncompliance, nonischemic cardiomyopathy, moderate coronary disease, substance abuse, diabetes mellitus, hypertension for evaluation of acute on chronic systolic congestive heart failure. Patient states that several days prior to admission he noticed increasing dyspnea on exertion which proceeded to dyspnea at rest. He has orthopnea but denies pedal edema or chest pain. No syncope. Note he had not taken his medications for 9 days prior to admission. These have been resumed and he is improving. Cardiology asked to evaluate. Chest x-ray on admission showed CHF. BNP was elevated at 1828. Drug screen positive for cocaine. Echocardiogram shows ejection fraction 20-25%, grade 2 diastolic  dysfunction. Electrocardiogram shows  sinus rhythm with PVCs, left ventricular hypertrophy, nonspecific ST changes and prolonged QT interval.  1 acute on chronic combined systolic/diastolic congestive heart failure-this is related to noncompliance with medications. I agree with reinitiating medications including lisinopril, carvedilol, Lasix and spironolactone. Follow renal function. His symptoms are improving and he can be discharged when at baseline. Note his LV function is similar to past echocardiograms.   2 substance abuse-patient counseled on avoiding cocaine.Patient's long-term prognosis will be poor unless this issue can be addressed.   3 history of moderate coronary disease-continue aspirin and statin at discharge.  Pt should FU with Dr Shirlee Latch following DC. We will sign off. Please call with questions.  Olga Millers, MD

## 2016-04-12 NOTE — Progress Notes (Signed)
PROGRESS NOTE  Baird CancerJerry W [name] NWG:[id number] DOB: 1956/02/06 DOA: 04/10/2016 PCP: [facility] Brief History:  60 year old male with a history of systolic and diastolic CHF, possibly substance abuse, coronary artery disease, hypertension, diabetes mellitus, insulin-dependent presented with 2 day history of worsening shortness breath. Apparently, the patient has missed all of his medications for the past 9-10 days when he was out of town. The patient complained of increasing orthopnea and dyspnea on exertion with intermittent chest discomfort. Further history reveals that he has been using crack cocaine which she states was approximately 1 week ago. He continues to smoke tobacco and drinks alcohol although he is not forthcoming regarding the amount of alcohol he drinks. In addition, the patient states that even before leaving town, the patient was not compliant with his medications.  Presentation per chest x-ray showed interstitial edema with small bilateral pleural effusions. The patient was started on intravenous furosemide for suspected acute on chronic systolic CHF.  Although the patient showed clinical improvement initially, he had worsening shortness of breath when de-escalated to oral furosemide. As result, IV furosemide doses were restarted twice a day.  Assessment/Plan: Acute on chronic systolic CHF -increase intravenous furosemide to 40 mg IVbid -12/19--pt became more SOB when de-escalated to po lasix -12/19 CXR--slight increase vascular congestion, bilateral pleural effusions -Patient still has mildly elevated JVD on clinical examination -09/10/2015 echo EF 25-30%, PA SP 40 precipitated by noncompliance in the setting of crack cocaine use -04/11/2016 echo EF 20-25%, diffuse HK, akinesis of the entireanteroseptal,   inferior, and inferoseptal myocardium -continue coreg, spiro, lisinopril  Polysubstance abuse -Etoh withdraw protocol -UDS--positive cocaine -HIV  antbody--neg  IDDM with renal and vascular palpitations -increase lantus to 20 units -novolog 4 units with meals -Hemoglobin A1c -Discontinue metformin -09/10/15 A1C--11.7  HTN -continue lisinopril -anticipate improvement with diuresis  Depression -continue zoloft, wellbutrin     Disposition Plan:   Home in 1-2 days  Family Communication:   No Family at bedside--Total time spent 35 minutes.  Greater than 50% spent face to face counseling and coordinating care.   Consultants:  none  Code Status:  FULL   DVT Prophylaxis:   [place] Lovenox   Procedures: As Listed in Progress Note Above  Antibiotics: None    Subjective: Patient is more short of breath. Denies any chest pain, fever, chills, headache, neck pain, nausea, vomiting, diarrhea, abdominal pain. No dysuria or hematuria.  Objective: Vitals:   04/11/16 1951 04/11/16 2343 04/12/16 0555 04/12/16 1214  BP: 123/73 105/67 111/73 117/70  Pulse: 94 89 85 88  Resp: 18 18 18 18   Temp: 98.1 F (36.7 C) 97.9 F (36.6 C) 98 F (36.7 C) 98 F (36.7 C)  TempSrc: Oral Oral Oral Oral  SpO2: 98% 94% 99% 96%  Weight:   72.6 kg (160 lb 1.6 oz)   Height:        Intake/Output Summary (Last 24 hours) at 04/12/16 1718 Last data filed at 04/12/16 1549  Gross per 24 hour  Intake             1180 ml  Output             1550 ml  Net             -370 ml   Weight change: 0.635 kg (1 lb 6.4 oz) Exam:   General:  Pt is alert, follows commands appropriately, not in acute distress  HEENT: No icterus, No thrush,  No neck mass, [place]/AT  Cardiovascular: RRR, S1/S2, no rubs, no gallops  Respiratory: Bibasilar crackles without wheezing. Good air movement.  Abdomen: Soft/+BS, non tender, non distended, no guarding  Extremities: No edema, No lymphangitis, No petechiae, No rashes, no synovitis   Data Reviewed: I have personally reviewed following labs and imaging studies Basic Metabolic Panel:  Recent Labs Lab  04/10/16 2203 04/11/16 1207 04/12/16 0337  NA 129* 135 133*  K 3.9 3.8 4.1  CL 97* 100* 99*  CO2 22 24 25   GLUCOSE 274* 238* 345*  BUN 7 12 14   CREATININE 0.89 1.04 0.98  CALCIUM 8.1* 8.2* 8.0*  MG  --  1.6* 2.1   Liver Function Tests:  Recent Labs Lab 04/11/16 1713  AST 60*  ALT 35  ALKPHOS 79  BILITOT 1.7*  PROT 6.4*  ALBUMIN 2.4*   No results for input(s): LIPASE, AMYLASE in the last 168 hours. No results for input(s): AMMONIA in the last 168 hours. Coagulation Profile: No results for input(s): INR, PROTIME in the last 168 hours. CBC:  Recent Labs Lab 04/10/16 2203  WBC 5.9  HGB 15.2  HCT 42.2  MCV 83.6  PLT 222   Cardiac Enzymes: No results for input(s): CKTOTAL, CKMB, CKMBINDEX, TROPONINI in the last 168 hours. BNP: Invalid input(s): POCBNP CBG:  Recent Labs Lab 04/11/16 1712 04/11/16 2216 04/12/16 0549 04/12/16 1215 04/12/16 1652  GLUCAP 263* 394* 295* 300* 146*   HbA1C: No results for input(s): HGBA1C in the last 72 hours. Urine analysis:    Component Value Date/Time   COLORURINE YELLOW 12/25/2015 1210   APPEARANCEUR CLEAR 12/25/2015 1210   LABSPEC 1.015 12/25/2015 1210   PHURINE 5.5 12/25/2015 1210   GLUCOSEU 500 (A) 12/25/2015 1210   HGBUR NEGATIVE 12/25/2015 1210   BILIRUBINUR NEGATIVE 12/25/2015 1210   KETONESUR NEGATIVE 12/25/2015 1210   PROTEINUR NEGATIVE 12/25/2015 1210   UROBILINOGEN 2.0 (H) 02/20/2014 1339   NITRITE NEGATIVE 12/25/2015 1210   LEUKOCYTESUR NEGATIVE 12/25/2015 1210   Sepsis Labs: @LABRCNTIP (procalcitonin:4,lacticidven:4) )No results found for this or any previous visit (from the past 240 hour(s)).   Scheduled Meds: . aspirin  81 mg Oral Daily  . buPROPion  100 mg Oral QHS  . carvedilol  3.125 mg Oral BID WC  . enoxaparin (LOVENOX) injection  40 mg Subcutaneous Q24H  . feeding supplement (GLUCERNA SHAKE)  237 mL Oral TID BM  . folic acid  1 mg Oral Daily  . furosemide  40 mg Intravenous BID  .  guaiFENesin  600 mg Oral BID  . Influenza vac split quadrivalent PF  0.5 mL Intramuscular Tomorrow-1000  . insulin aspart  0-15 Units Subcutaneous TID WC  . insulin aspart  4 Units Subcutaneous TID WC  . [START ON 04/13/2016] insulin glargine  20 Units Subcutaneous Daily  . lisinopril  10 mg Oral Daily  . magnesium oxide  400 mg Oral Daily  . multivitamin with minerals  1 tablet Oral Daily  . pneumococcal 23 valent vaccine  0.5 mL Intramuscular Tomorrow-1000  . potassium chloride  10 mEq Oral Daily  . pravastatin  10 mg Oral q1800  . sertraline  100 mg Oral Daily  . sodium chloride flush  3 mL Intravenous Q12H  . spironolactone  25 mg Oral Daily  . thiamine  100 mg Oral Daily   Continuous Infusions:  Procedures/Studies: Dg Chest 2 View  Result Date: 04/12/2016 CLINICAL DATA:  CHF. Admitted 3 days ago for shortness of breath and chest congestion. History of  coronary artery disease and previous MI, diabetes, current smoker. EXAM: CHEST  2 VIEW COMPARISON:  PA and lateral chest x-ray of April 10, 2016. FINDINGS: The lungs are well-expanded. There are bilateral pleural effusions greater on the right than on the left not greatly changed from yesterday's study. The cardiac silhouette is normal in size. The pulmonary vascularity is slightly more conspicuous today. There may be lower lobe infiltrates obscured by the pleural effusions. There is calcification in the wall of the aortic arch. The observed bony thorax is unremarkable. IMPRESSION: COPD. Bilateral pleural effusions. Probable bibasilar atelectasis or pneumonia. Slight increased prominence of the pulmonary vascularity suggests low-grade pulmonary edema. Electronically Signed   By: Monti Jilek  [place] M.D.   On: 04/12/2016 17:01   Dg Chest 2 View  Result Date: 04/10/2016 CLINICAL DATA:  Shortness of breath and chest pain EXAM: CHEST  2 VIEW COMPARISON:  Chest radiograph 09/24/2015 FINDINGS: Cardiomediastinal contours are unchanged. There are  bilateral small pleural effusions with associated atelectasis. No focal airspace consolidation. Mildly increased interstitial markings, possibly mild Pulmonary edema. No pneumothorax. IMPRESSION: Small bilateral pleural effusions with associated atelectasis. Possible mild interstitial pulmonary edema. Electronically Signed   By: Deatra Robinson M.D.   On: 04/10/2016 21:20    Maryjean Corpening, DO  [facility] Pager [phone]  If 7PM-7AM, please contact night-coverage [email or web address] Password [email or web address] 04/12/2016, 5:18 PM   LOS: 0 days

## 2016-04-12 NOTE — Progress Notes (Signed)
Inpatient Diabetes Program Recommendations  AACE/ADA: New Consensus Statement on Inpatient Glycemic Control (2015)  Target Ranges:  Prepandial:   less than 140 mg/dL      Peak postprandial:   less than 180 mg/dL (1-2 hours)      Critically ill patients:  140 - 180 mg/dL   Results for [name], [name] (MRN [id number]) as of 04/12/2016 08:28  Ref. Range 04/11/2016 06:19 04/11/2016 12:24 04/11/2016 17:12 04/11/2016 22:16  Glucose-Capillary Latest Ref Range: 65 - 99 mg/dL 062 (H) 694 (H) 854 (H) 394 (H)   Results for [name], [name] (MRN [id number]) as of 04/12/2016 08:28  Ref. Range 04/12/2016 05:49  Glucose-Capillary Latest Ref Range: 65 - 99 mg/dL 381 (H)    Admit with: SOB  History: DM, CHF, ETOH Abuse  Home DM Meds: Lantus 20 units daily (Patient reports taking 1-2x per week if:If BG >200, will take 15-16 unitsIf BG >250, will take 30-40 units)                             Metformin 1000 mg BID  Current Insulin Orders: Novolog Moderate Correction Scale/ SSI (0-15 units) TID AC      Lantus 10 units daily       MD- Please consider the following in-hospital insulin adjustments:  1. Increase Lantus to 14 units daily (0.2 units/kg dosing based on weight of 72 kg)  2. Start Novolog Meal Coverage: Novolog 4 units TID with meals (hold if pt eats <50% of meal)  3. Please check current Hemoglobin A1c level- Last one on file was 11.7% back on 09/10/15     --Will follow patient during hospitalization--  Ambrose Finland RN, MSN, CDE Diabetes Coordinator Inpatient Glycemic Control Team Team Pager: [phone] (8a-5p)

## 2016-04-12 NOTE — Progress Notes (Signed)
Nutrition Follow-up  DOCUMENTATION CODES:   Non-severe (moderate) malnutrition in context of chronic illness  INTERVENTION:  Glucerna Shake po TID, each supplement provides 220 kcal and 10 grams of protein Encourage adequate healthful PO intake with 3 daily meals and HS snack  Provided and discussed "Carbohydrate Counting for People with Diabetes" handout from the Academy of Nutrition and Dietetics   NUTRITION DIAGNOSIS:   Predicted suboptimal nutrient intake related to poor appetite as evidenced by per patient/family report, percent weight loss.  ongoing  GOAL:   Patient will meet greater than or equal to 90% of their needs  Being met  MONITOR:   PO intake, Supplement acceptance, Labs, Skin, Weight trends  REASON FOR ASSESSMENT:   Malnutrition Screening Tool    ASSESSMENT:   60 y.o. male with medical history significant of CHF with EF 25-30% at baseline.  Patient has missed all of his meds for the past 9 days due to being out of town.  He has developed slowly onset, worsening SOB, orthopnea, mild DOE, mild intermittent CP.  Symptoms onset 2 days ago, worsening, persistent.  Pt states that he has had a poor appetite for the past month and has been eating 50% less than usual. He reports losing weight from 168 to 170 lbs. He states that prior to diagnosis with diabetes he weighed 216 lbs, then lost to 190 lbs, and now maintains 170 lbs. Per nursing notes, pt is eating 50-100% of meals; pt reports eating 50-55% of meals. Per nutrition focused physical exam pt has moderate fat wasting and severe muscle wasting. Pt states that he usually takes Metformin daily and takes Lantus in the morning if his blood sugar is high, but the past few days he has not been checking his blood sugar or taking insulin. He states that he does not follow a special diet.   Provided and discussed "Carbohydrate Counting for People with Diabetes" handout from the Academy of Nutrition and Dietetics. Reviewed  pt's dietary recall. Encouraged patient to add protein and vegetables to each meal and to decrease portion sizes of rice, potatoes, beans, juice, and sweets. Pt voiced understanding. He likes turnip greens and can add to meals, likes eggs and can add to breakfasts, and plans to decrease portion sizes of sweets/desserts.  Labs: elevated glucose, low sodium, low chloride, low calcium  Diet Order:  Diet heart healthy/carb modified Room service appropriate? Yes; Fluid consistency: Thin  Skin:  Reviewed, no issues  Last BM:  PTA  Height:   Ht Readings from Last 1 Encounters:  04/11/16 _0  (1.88 m)    Weight:   Wt Readings from Last 1 Encounters:  04/12/16 160 lb 1.6 oz (72.6 kg)    Ideal Body Weight:  86.4 kg  BMI:  Body mass index is 20.56 kg/m.  Estimated Nutritional Needs:   Kcal:  2000-2200  Protein:  85-95 grams  Fluid:  2-2.2 L/day  EDUCATION NEEDS:   Education needs addressed  Scarlette Ar RD, CSP, LDN Inpatient Clinical Dietitian Pager: [phone] After Hours Pager: [phone]

## 2016-04-12 NOTE — Care Management Obs Status (Signed)
MEDICARE OBSERVATION STATUS NOTIFICATION   Patient Details  Name: [name] MRN: [id number] Date of Birth: 09/05/1955   Medicare Observation Status Notification Given:  Yes   Cherrie Distance, RN 04/12/2016, 1:31 PM

## 2016-04-13 DIAGNOSIS — E440 Moderate protein-calorie malnutrition: Secondary | ICD-10-CM

## 2016-04-13 DIAGNOSIS — E1165 Type 2 diabetes mellitus with hyperglycemia: Secondary | ICD-10-CM

## 2016-04-13 DIAGNOSIS — Z794 Long term (current) use of insulin: Secondary | ICD-10-CM

## 2016-04-13 LAB — GLUCOSE, CAPILLARY
GLUCOSE-CAPILLARY: 235 mg/dL — AB (ref 65–99)
Glucose-Capillary: 181 mg/dL — ABNORMAL HIGH (ref 65–99)
Glucose-Capillary: 227 mg/dL — ABNORMAL HIGH (ref 65–99)
Glucose-Capillary: 99 mg/dL (ref 65–99)

## 2016-04-13 LAB — BASIC METABOLIC PANEL
Anion gap: 9 (ref 5–15)
BUN: 15 mg/dL (ref 6–20)
CALCIUM: 8.5 mg/dL — AB (ref 8.9–10.3)
CO2: 25 mmol/L (ref 22–32)
CREATININE: 1.07 mg/dL (ref 0.61–1.24)
Chloride: 101 mmol/L (ref 101–111)
GFR calc Af Amer: >60 mL/min (ref >60)
GLUCOSE: 189 mg/dL — AB (ref 65–99)
Potassium: 4.5 mmol/L (ref 3.5–5.1)
Sodium: 135 mmol/L (ref 135–145)

## 2016-04-13 MED ORDER — FUROSEMIDE 40 MG PO TABS
40.0000 mg | ORAL_TABLET | Freq: Two times a day (BID) | ORAL | Status: DC
  Administered 2016-04-13 – 2016-04-14 (×2): 40 mg via ORAL
  Filled 2016-04-13 (×2): qty 1

## 2016-04-13 NOTE — Progress Notes (Signed)
PROGRESS NOTE    [name]  ZOX:[id number] DOB: [birth year] DOA: 04/10/2016 [facility]   Chief Complaint  Patient presents with  . Shortness of Breath     Brief Narrative:  60 year old male with a history of systolic and diastolic CHF, possibly substance abuse, coronary artery disease, hypertension, diabetes mellitus, insulin-dependent presented with 2 day history of worsening shortness breath. Apparently, the patient has missed all of his medications for the past 9-10 days when he was out of town. The patient complained of increasing orthopnea and dyspnea on exertion with intermittent chest discomfort. Further history reveals that he has been using crack cocaine which she states was approximately 1 week ago. He continues to smoke tobacco and drinks alcohol although he is not forthcoming regarding the amount of alcohol he drinks. In addition, the patient states that even before leaving town, the patient was not compliant with his medications.  Presentation per chest x-ray showed interstitial edema with small bilateral pleural effusions. The patient was started on intravenous furosemide for suspected acute on chronic systolicCHF.  Although the patient showed clinical improvement initially, he had worsening shortness of breath when de-escalated to oral furosemide. As result, IV furosemide doses were restarted twice a day.  Will transition back to PO lasix and monitor for additional 24 hours. Assessment & Plan   Acute on chronic systolic CHF -BNP 1828 -Patient placed furosemide to 40 mg IVbid as he was SOB and had vascular congestion on xray with bilateral pleural effusions -echocardiogram showed EF 20-25%, diffuse HK, akinesis of the entireanteroseptal, inferior, and inferoseptal myocardium -Cardiology consulted and appreciated, follow up with Dr. Shirlee LatchMcLean, no changes to current meds -continue coreg, spiro, lisinopril -Will transition to PO lasix  Polysubstance  abuse -Etoh withdraw protocol -UDS--positive cocaine -HIV antbody--neg -Counseled on the need for cessation   IDDMwith renal and vascular palpitations -increase lantus to 20 units -novolog 4 units with meals -Hemoglobin A1c pending -Discontinue metformin -09/10/15 A1C--11.7  HTN -continue lisinopril, diuresis with lasix and spironolactone, coreg -anticipate improvement with diuresis  Depression -continue zoloft, wellbutrin  DVT Prophylaxis  lovenox  Code Status: Full  Family Communication: None at bedside  Disposition Plan: Admitted. Home when stable. Likely 24hrs.  Consultants Cardiology   Procedures  Echocardiogram   Antibiotics   Anti-infectives    None      Subjective:   Thomas ShawlJerry [name] seen and examined today. Patient feels  Breathing has improved.  Would like to take a shower.  Denies chest pain, abdominal pain, nausea, vomiting, diarrhea, constipation.    Objective:   Vitals:   04/12/16 0555 04/12/16 1214 04/12/16 1942 04/13/16 0511  BP: 111/73 117/70 114/74 107/72  Pulse: 85 88 84 (!) 7  Resp: 18 18 16 16   Temp: 98 F (36.7 C) 98 F (36.7 C) 98.6 F (37 C) 98.4 F (36.9 C)  TempSrc: Oral Oral Oral Oral  SpO2: 99% 96% 98% 98%  Weight: 72.6 kg (160 lb 1.6 oz)   72.2 kg (159 lb 1.6 oz)  Height:        Intake/Output Summary (Last 24 hours) at 04/13/16 1152 Last data filed at 04/13/16 0906  Gross per 24 hour  Intake              717 ml  Output             1000 ml  Net             -283 ml   Filed Weights   04/11/16  0220 04/12/16 0555 04/13/16 0511  Weight: 72 kg (158 lb 11.2 oz) 72.6 kg (160 lb 1.6 oz) 72.2 kg (159 lb 1.6 oz)    Exam  General: Well developed, well nourished, NAD, appears stated age  HEENT: NCAT, mucous membranes moist.   Cardiovascular: S1 S2 auscultated, no rubs, murmurs or gallops. Regular rate and rhythm.  Respiratory: Clear to auscultation bilaterally with equal chest rise  Abdomen: Soft, nontender,  nondistended, + bowel sounds  Extremities: warm dry without cyanosis clubbing or edema  Neuro: AAOx3, nonfocal  Psych: Normal affect and demeanor with intact judgement and insight   Data Reviewed: I have personally reviewed following labs and imaging studies  CBC:  Recent Labs Lab 04/10/16 2203  WBC 5.9  HGB 15.2  HCT 42.2  MCV 83.6  PLT 222   Basic Metabolic Panel:  Recent Labs Lab 04/10/16 2203 04/11/16 1207 04/12/16 0337 04/13/16 0459  NA 129* 135 133* 135  K 3.9 3.8 4.1 4.5  CL 97* 100* 99* 101  CO2 22 24 25 25   GLUCOSE 274* 238* 345* 189*  BUN 7 12 14 15   CREATININE 0.89 1.04 0.98 1.07  CALCIUM 8.1* 8.2* 8.0* 8.5*  MG  --  1.6* 2.1  --    GFR: Estimated Creatinine Clearance: 74 mL/min (by C-G formula based on SCr of 1.07 mg/dL). Liver Function Tests:  Recent Labs Lab 04/11/16 1713  AST 60*  ALT 35  ALKPHOS 79  BILITOT 1.7*  PROT 6.4*  ALBUMIN 2.4*   No results for input(s): LIPASE, AMYLASE in the last 168 hours. No results for input(s): AMMONIA in the last 168 hours. Coagulation Profile: No results for input(s): INR, PROTIME in the last 168 hours. Cardiac Enzymes: No results for input(s): CKTOTAL, CKMB, CKMBINDEX, TROPONINI in the last 168 hours. BNP (last 3 results) No results for input(s): PROBNP in the last 8760 hours. HbA1C: No results for input(s): HGBA1C in the last 72 hours. CBG:  Recent Labs Lab 04/12/16 1215 04/12/16 1652 04/12/16 2104 04/13/16 0625 04/13/16 1118  GLUCAP 300* 146* 193* 181* 235*   Lipid Profile: No results for input(s): CHOL, HDL, LDLCALC, TRIG, CHOLHDL, LDLDIRECT in the last 72 hours. Thyroid Function Tests: No results for input(s): TSH, T4TOTAL, FREET4, T3FREE, THYROIDAB in the last 72 hours. Anemia Panel: No results for input(s): VITAMINB12, FOLATE, FERRITIN, TIBC, IRON, RETICCTPCT in the last 72 hours. Urine analysis:    Component Value Date/Time   COLORURINE YELLOW 12/25/2015 1210   APPEARANCEUR  CLEAR 12/25/2015 1210   LABSPEC 1.015 12/25/2015 1210   PHURINE 5.5 12/25/2015 1210   GLUCOSEU 500 (A) 12/25/2015 1210   HGBUR NEGATIVE 12/25/2015 1210   BILIRUBINUR NEGATIVE 12/25/2015 1210   KETONESUR NEGATIVE 12/25/2015 1210   PROTEINUR NEGATIVE 12/25/2015 1210   UROBILINOGEN 2.0 (H) 02/20/2014 1339   NITRITE NEGATIVE 12/25/2015 1210   LEUKOCYTESUR NEGATIVE 12/25/2015 1210   Sepsis Labs: @LABRCNTIP (procalcitonin:4,lacticidven:4)  )No results found for this or any previous visit (from the past 240 hour(s)).    Radiology Studies: Dg Chest 2 View  Result Date: 04/12/2016 CLINICAL DATA:  CHF. Admitted 3 days ago for shortness of breath and chest congestion. History of coronary artery disease and previous MI, diabetes, current smoker. EXAM: CHEST  2 VIEW COMPARISON:  PA and lateral chest x-ray of April 10, 2016. FINDINGS: The lungs are well-expanded. There are bilateral pleural effusions greater on the right than on the left not greatly changed from yesterday's study. The cardiac silhouette is normal in size. The pulmonary  vascularity is slightly more conspicuous today. There may be lower lobe infiltrates obscured by the pleural effusions. There is calcification in the wall of the aortic arch. The observed bony thorax is unremarkable. IMPRESSION: COPD. Bilateral pleural effusions. Probable bibasilar atelectasis or pneumonia. Slight increased prominence of the pulmonary vascularity suggests low-grade pulmonary edema. Electronically Signed   By: David  [place] M.D.   On: 04/12/2016 17:01     Scheduled Meds: . aspirin  81 mg Oral Daily  . buPROPion  100 mg Oral QHS  . carvedilol  3.125 mg Oral BID WC  . enoxaparin (LOVENOX) injection  40 mg Subcutaneous Q24H  . feeding supplement (GLUCERNA SHAKE)  237 mL Oral TID BM  . folic acid  1 mg Oral Daily  . furosemide  40 mg Intravenous BID  . guaiFENesin  600 mg Oral BID  . insulin aspart  0-15 Units Subcutaneous TID WC  . insulin aspart  4  Units Subcutaneous TID WC  . insulin glargine  20 Units Subcutaneous Daily  . lisinopril  10 mg Oral Daily  . magnesium oxide  400 mg Oral Daily  . multivitamin with minerals  1 tablet Oral Daily  . pneumococcal 23 valent vaccine  0.5 mL Intramuscular Tomorrow-1000  . potassium chloride  10 mEq Oral Daily  . pravastatin  10 mg Oral q1800  . sertraline  100 mg Oral Daily  . sodium chloride flush  3 mL Intravenous Q12H  . spironolactone  25 mg Oral Daily  . thiamine  100 mg Oral Daily   Continuous Infusions:   LOS: 1 day   Time Spent in minutes   30 minutes  Akhilesh Sassone D.O. on 04/13/2016 at 11:52 AM  Between 7am to 7pm - Pager - [phone]  After 7pm go to [email or web address] - password [email or web address]  And look for the night coverage person covering for me after hours  [facility] Office  [phone]

## 2016-04-13 NOTE — Progress Notes (Signed)
Patient had 6 beats of vtach. MD notified. Patient asymptomatic BP 100/67. Hr 76. Will continue to monitor.  Petra Kuba RN 04/13/2016 12:43 AM

## 2016-04-14 DIAGNOSIS — Z23 Encounter for immunization: Secondary | ICD-10-CM | POA: Diagnosis not present

## 2016-04-14 LAB — HEMOGLOBIN A1C
HEMOGLOBIN A1C: 12 % — AB (ref 4.8–5.6)
MEAN PLASMA GLUCOSE: 298 mg/dL

## 2016-04-14 LAB — BASIC METABOLIC PANEL
ANION GAP: 9 (ref 5–15)
BUN: 18 mg/dL (ref 6–20)
CHLORIDE: 102 mmol/L (ref 101–111)
CO2: 23 mmol/L (ref 22–32)
Calcium: 8.8 mg/dL — ABNORMAL LOW (ref 8.9–10.3)
Creatinine, Ser: 1.01 mg/dL (ref 0.61–1.24)
GFR calc Af Amer: >60 mL/min (ref >60)
GFR calc non Af Amer: >60 mL/min (ref >60)
GLUCOSE: 124 mg/dL — AB (ref 65–99)
POTASSIUM: 4.5 mmol/L (ref 3.5–5.1)
Sodium: 134 mmol/L — ABNORMAL LOW (ref 135–145)

## 2016-04-14 LAB — GLUCOSE, CAPILLARY
GLUCOSE-CAPILLARY: 215 mg/dL — AB (ref 65–99)
Glucose-Capillary: 129 mg/dL — ABNORMAL HIGH (ref 65–99)

## 2016-04-14 MED ORDER — CARVEDILOL 3.125 MG PO TABS: 3.1250 mg | ORAL_TABLET | Freq: Two times a day (BID) | ORAL | 0 refills | Status: DC

## 2016-04-14 MED ORDER — LISINOPRIL 10 MG PO TABS: 10.0000 mg | ORAL_TABLET | Freq: Every day | ORAL | 0 refills | Status: DC

## 2016-04-14 MED ORDER — FUROSEMIDE 40 MG PO TABS: 40.0000 mg | ORAL_TABLET | Freq: Two times a day (BID) | ORAL | 0 refills | Status: DC

## 2016-04-14 NOTE — Consult Note (Signed)
   [facility] Inpatient Consult   04/14/2016  [name] May 01, 1955 [id number]   Patient was listed for [employer] [facility] Registry.  Patient states she gets all of his care from the [facility] in [place] and that's where he gets his medications.  He states he is currently homeless but is planning to stay with a friend until he recuperates.  He states he was living in his car but no longer has his car.  He would use public transportation now.  [facility] Care Management will provide post hospital follow up calls with EMMI HF program. Brochure and contact information was given.  For questions, please contact:  Charlesetta Shanks, RN BSN CCM [facility]  [phone] business mobile phone Toll free office [phone]

## 2016-04-14 NOTE — Discharge Summary (Addendum)
Physician Discharge Summary  DOMONIQUE LASOTA MVH:[id number] DOB: 08-20-1955 DOA: 04/10/2016  PCP: [facility]  Admit date: 04/10/2016 Discharge date: 04/14/2016  Time spent: 45 minutes  Recommendations for Outpatient Follow-up:  Patient will be discharged to home.  Patient will need to follow up with primary care provider within one week of discharge, repeat BMP in one week and discuss diabetes management.  Follow up with your cardiologist. Patient should continue medications as prescribed.  Patient should follow a heart healthy/carb modified diet.   Discharge Diagnoses:  Acute on chronic systolic CHF Polysubstance abuse IDDMwith renal and vascular palpitations HTN Depression  Discharge Condition: stable   Diet recommendation: heart healthy/carb modified  Filed Weights   04/12/16 0555 04/13/16 0511 04/14/16 0419  Weight: 72.6 kg (160 lb 1.6 oz) 72.2 kg (159 lb 1.6 oz) 71.8 kg (158 lb 3.2 oz)    History of present illness:  On 04/11/2016 by Dr. Lyda Perone DANEK WIEDERKEHR is a 60 y.o. male with medical history significant of CHF with EF 25-30% at baseline.  Patient has missed all of his meds for the past 9 days due to being out of town.  He has developed slowly onset, worsening SOB, orthopnea, mild DOE, mild intermittent CP.  Symptoms onset 2 days ago, worsening, persistent.  Hospital Course:  Acute on chronic systolic CHF -BNP 1828 -Patient placed furosemide to 40 mg IVbid as he was SOB and had vascular congestion on xray with bilateral pleural effusions -echocardiogram showed EF 20-25%, diffuse HK, akinesis of the entireanteroseptal, inferior, and inferoseptal myocardium -Cardiology consulted and appreciated, follow up with Dr. Shirlee Latch, no changes to current meds -continue coreg, spiro, lisinopril -Transitioned to PO lasix- UOP 1075cc over past 24 hours -follow up with cardiology in [place], [place]  Polysubstance abuse -Etoh withdraw protocol -UDS--positive  cocaine -HIV antbody--neg -Counseled on the need for cessation   IDDMwith renal and vascular palpitations -Continue home meds at discharge -Hemoglobin A1c 12 -Follow up with PCP for better DM control  HTN -continue lisinopril, diuresis with lasix and spironolactone, coreg  Depression -continue zoloft, wellbutrin  Moderate malnutrition -Nutrition consulted -Continue supplements  Procedures: Echocardiogram  Consultations: Cardiology  Discharge Exam: Vitals:   04/13/16 2000 04/14/16 0419  BP: 109/79 114/78  Pulse: 75 75  Resp: 16 16  Temp: 98.3 F (36.8 C) 98.2 F (36.8 C)   Vanetta Shawl seen and examined today. Patient feels breathing is better.  Ready to go home.  Has a cardiologist in [place], [place].  Currently, he denies chest pain, abdominal pain, nausea, vomiting, diarrhea, constipation.    Exam  General: Well developed, well nourished, NAD, appears stated age  HEENT: NCAT, mucous membranes moist.   Cardiovascular: S1 S2 auscultated, no rubs, murmurs or gallops. Regular rate and rhythm.  Respiratory: Clear to auscultation bilaterally with equal chest rise  Abdomen: Soft, nontender, nondistended, + bowel sounds  Extremities: warm dry without cyanosis clubbing or edema  Neuro: AAOx3, nonfocal  Psych: Normal affect and demeanor with intact judgement and insight  Discharge Instructions Discharge Instructions    (HEART FAILURE PATIENTS) Call MD:  Anytime you have any of the following symptoms: 1) 3 pound weight gain in 24 hours or 5 pounds in 1 week 2) shortness of breath, with or without a dry hacking cough 3) swelling in the hands, feet or stomach 4) if you have to sleep on extra pillows at night in order to breathe.    Complete by:  As directed    Discharge instructions  Complete by:  As directed    Patient will be discharged to home.  Patient will need to follow up with primary care provider within one week of discharge, repeat BMP in one week  and discuss diabetes management.  Follow up with your cardiologist. Patient should continue medications as prescribed.  Patient should follow a heart healthy/carb modified diet.     Current Discharge Medication List    CONTINUE these medications which have CHANGED   Details  carvedilol (COREG) 3.125 MG tablet Take 1 tablet (3.125 mg total) by mouth 2 (two) times daily with a meal. Qty: 60 tablet, Refills: 0    furosemide (LASIX) 40 MG tablet Take 1 tablet (40 mg total) by mouth 2 (two) times daily. Qty: 60 tablet, Refills: 0    lisinopril (PRINIVIL,ZESTRIL) 10 MG tablet Take 1 tablet (10 mg total) by mouth daily. Qty: 30 tablet, Refills: 0      CONTINUE these medications which have NOT CHANGED   Details  aspirin 81 MG tablet Take 81 mg by mouth daily.    buPROPion (WELLBUTRIN) 100 MG tablet Take 100 mg by mouth at bedtime.     feeding supplement, GLUCERNA SHAKE, (GLUCERNA SHAKE) LIQD Take 237 mLs by mouth 3 (three) times daily between meals. Refills: 0    folic acid (FOLVITE) 1 MG tablet Take 1 tablet (1 mg total) by mouth daily.    guaiFENesin (MUCINEX) 600 MG 12 hr tablet Take 1 tablet (600 mg total) by mouth 2 (two) times daily. Qty: 30 tablet, Refills: 0    hydrOXYzine (ATARAX/VISTARIL) 25 MG tablet Take 25 mg by mouth 3 (three) times daily as needed for anxiety or itching.    Insulin Glargine (LANTUS SOLOSTAR) 100 UNIT/ML Solostar Pen Inject 20 Units into the skin every morning. Qty: 15 mL, Refills: 0    magnesium oxide (MAG-OX) 400 (241.3 Mg) MG tablet Take 1 tablet (400 mg total) by mouth daily. Qty: 30 tablet, Refills: 0    metFORMIN (GLUCOPHAGE) 1000 MG tablet Take 0.5 tablets (500 mg total) by mouth 2 (two) times daily with a meal. For high blood sugar control    Multiple Vitamin (MULTIVITAMIN WITH MINERALS) TABS tablet Take 1 tablet by mouth daily.    potassium chloride (K-DUR,KLOR-CON) 10 MEQ tablet Take 10 mEq by mouth daily.    pravastatin (PRAVACHOL) 10 MG  tablet Take 1 tablet (10 mg total) by mouth daily. For high cholesterol control Qty: 30 tablet, Refills: 0    sertraline (ZOLOFT) 100 MG tablet Take 100 mg by mouth daily.    spironolactone (ALDACTONE) 25 MG tablet Take 25 mg by mouth daily.    thiamine 100 MG tablet Take 1 tablet (100 mg total) by mouth daily.    traZODone (DESYREL) 100 MG tablet Take 1 tablet (100 mg total) by mouth at bedtime. For sleep Qty: 30 tablet, Refills: 0      STOP taking these medications     ARIPiprazole (ABILIFY) 5 MG tablet        No Known Allergies Follow-up Information    Marca Anconaalton McLean, MD Follow up.   Specialty:  Cardiology Why:  The office will call you to arrange an appointent in 1-2 weeks Contact information: [address] [place] [address] [phone]        [facility]. Schedule an appointment as soon as possible for a visit in 1 week(s).   Specialty:  General Practice Why:  Hospital follow up Contact information: [address] [place]  [place] [address] [phone]            The results of significant diagnostics from this hospitalization (including imaging, microbiology, ancillary and laboratory) are listed below for reference.    Significant Diagnostic Studies: Dg Chest 2 View  Result Date: 04/12/2016 CLINICAL DATA:  CHF. Admitted 3 days ago for shortness of breath and chest congestion. History of coronary artery disease and previous MI, diabetes, current smoker. EXAM: CHEST  2 VIEW COMPARISON:  PA and lateral chest x-ray of April 10, 2016. FINDINGS: The lungs are well-expanded. There are bilateral pleural effusions greater on the right than on the left not greatly changed from yesterday's study. The cardiac silhouette is normal in size. The pulmonary vascularity is slightly more conspicuous today. There may be lower lobe infiltrates obscured by the pleural effusions. There is calcification in the wall of the aortic arch. The observed bony  thorax is unremarkable. IMPRESSION: COPD. Bilateral pleural effusions. Probable bibasilar atelectasis or pneumonia. Slight increased prominence of the pulmonary vascularity suggests low-grade pulmonary edema. Electronically Signed   By: David  [place]iland M.D.   On: 04/12/2016 17:01   Dg Chest 2 View  Result Date: 04/10/2016 CLINICAL DATA:  Shortness of breath and chest pain EXAM: CHEST  2 VIEW COMPARISON:  Chest radiograph 09/24/2015 FINDINGS: Cardiomediastinal contours are unchanged. There are bilateral small pleural effusions with associated atelectasis. No focal airspace consolidation. Mildly increased interstitial markings, possibly mild Pulmonary edema. No pneumothorax. IMPRESSION: Small bilateral pleural effusions with associated atelectasis. Possible mild interstitial pulmonary edema. Electronically Signed   By: Deatra Robinson M.D.   On: 04/10/2016 21:20    Microbiology: No results found for this or any previous visit (from the past 240 hour(s)).   Labs: Basic Metabolic Panel:  Recent Labs Lab 04/10/16 2203 04/11/16 1207 04/12/16 0337 04/13/16 0459 04/14/16 0405  NA 129* 135 133* 135 134*  K 3.9 3.8 4.1 4.5 4.5  CL 97* 100* 99* 101 102  CO2 22 24 25 25 23   GLUCOSE 274* 238* 345* 189* 124*  BUN 7 12 14 15 18   CREATININE 0.89 1.04 0.98 1.07 1.01  CALCIUM 8.1* 8.2* 8.0* 8.5* 8.8*  MG  --  1.6* 2.1  --   --    Liver Function Tests:  Recent Labs Lab 04/11/16 1713  AST 60*  ALT 35  ALKPHOS 79  BILITOT 1.7*  PROT 6.4*  ALBUMIN 2.4*   No results for input(s): LIPASE, AMYLASE in the last 168 hours. No results for input(s): AMMONIA in the last 168 hours. CBC:  Recent Labs Lab 04/10/16 2203  WBC 5.9  HGB 15.2  HCT 42.2  MCV 83.6  PLT 222   Cardiac Enzymes: No results for input(s): CKTOTAL, CKMB, CKMBINDEX, TROPONINI in the last 168 hours. BNP: BNP (last 3 results)  Recent Labs  09/12/15 0516 09/14/15 0518 04/10/16 2203  BNP 753.4* 862.7* 1,828.2*    ProBNP  (last 3 results) No results for input(s): PROBNP in the last 8760 hours.  CBG:  Recent Labs Lab 04/13/16 0625 04/13/16 1118 04/13/16 1635 04/13/16 2118 04/14/16 0552  GLUCAP 181* 235* 227* 99 129*       Signed:  Mayley Lish  [facility] 04/14/2016, 10:13 AM

## 2016-04-14 NOTE — Progress Notes (Signed)
Refused bed alarm. Will continue to monitor. 

## 2016-04-14 NOTE — Discharge Instructions (Signed)
Heart Failure °Heart failure is a condition in which the heart has trouble pumping blood because it has become weak or stiff. This means that the heart does not pump blood efficiently for the body to work well. For some people with heart failure, fluid may back up into the lungs and there may be swelling (edema) in the lower legs. Heart failure is usually a long-term (chronic) condition. It is important for you to take good care of yourself and follow the treatment plan from your health care provider. °What are the causes? °This condition is caused by some health problems, including: °· High blood pressure (hypertension). Hypertension causes the heart muscle to work harder than normal. High blood pressure eventually causes the heart to become stiff and weak. °· Coronary artery disease (CAD). CAD is the buildup of cholesterol and fat (plaques) in the arteries of the heart. °· Heart attack (myocardial infarction). Injured tissue, which is caused by the heart attack, does not contract as well and the heart's ability to pump blood is weakened. °· Abnormal heart valves. When the heart valves do not open and close properly, the heart muscle must pump harder to keep the blood flowing. °· Heart muscle disease (cardiomyopathy or myocarditis). Heart muscle disease is damage to the heart muscle from a variety of causes, such as drug or alcohol abuse, infections, or unknown causes. These can increase the risk of heart failure. °· Lung disease. When the lungs do not work properly, the heart must work harder. ° °What increases the risk? °Risk of heart failure increases as a person ages. This condition is also more likely to develop in people who: °· Are overweight. °· Are male. °· Smoke or chew tobacco. °· Abuse alcohol or illegal drugs. °· Have taken medicines that can damage the heart, such as chemotherapy drugs. °· Have diabetes. °? High blood sugar (glucose) is associated with high fat (lipid) levels in the blood. °? Diabetes  can also damage tiny blood vessels that carry nutrients to the heart muscle. °· Have abnormal heart rhythms. °· Have thyroid problems. °· Have low blood counts (anemia). ° °What are the signs or symptoms? °Symptoms of this condition include: °· Shortness of breath with activity, such as when climbing stairs. °· Persistent cough. °· Swelling of the feet, ankles, legs, or abdomen. °· Unexplained weight gain. °· Difficulty breathing when lying flat (orthopnea). °· Waking from sleep because of the need to sit up and get more air. °· Rapid heartbeat. °· Fatigue and loss of energy. °· Feeling light-headed, dizzy, or close to fainting. °· Loss of appetite. °· Nausea. °· Increased urination during the night (nocturia). °· Confusion. ° °How is this diagnosed? °This condition is diagnosed based on: °· Medical history, symptoms, and a physical exam. °· Diagnostic tests, which may include: °? Echocardiogram. °? Electrocardiogram (ECG). °? Chest X-ray. °? Blood tests. °? Exercise stress test. °? Radionuclide scans. °? Cardiac catheterization and angiogram. ° °How is this treated? °Treatment for this condition is aimed at managing the symptoms of heart failure. Medicines, behavioral changes, or other treatments may be necessary to treat heart failure. °Medicines °These may include: °· Angiotensin-converting enzyme (ACE) inhibitors. This type of medicine blocks the effects of a blood protein called angiotensin-converting enzyme. ACE inhibitors relax (dilate) the blood vessels and help to lower blood pressure. °· Angiotensin receptor blockers (ARBs). This type of medicine blocks the actions of a blood protein called angiotensin. ARBs dilate the blood vessels and help to lower blood pressure. °· Water   pills (diuretics). Diuretics cause the kidneys to remove salt and water from the blood. The extra fluid is removed through urination, leaving a lower volume of blood that the heart has to pump. °· Beta blockers. These improve heart  muscle strength and they prevent the heart from beating too quickly. °· Digoxin. This increases the force of the heartbeat. ° °Healthy behavior changes °These may include: °· Reaching and maintaining a healthy weight. °· Stopping smoking or chewing tobacco. °· Eating heart-healthy foods. °· Limiting or avoiding alcohol. °· Stopping use of street drugs (illegal drugs). °· Physical activity. ° °Other treatments °These may include: °· Surgery to open blocked coronary arteries or repair damaged heart valves. °· Placement of a biventricular pacemaker to improve heart muscle function (cardiac resynchronization therapy). This device paces both the right ventricle and left ventricle. °· Placement of a device to treat serious abnormal heart rhythms (implantable cardioverter defibrillator, or ICD). °· Placement of a device to improve the pumping ability of the heart (left ventricular assist device, or LVAD). °· Heart transplant. This can cure heart failure, and it is considered for certain patients who do not improve with other therapies. ° °Follow these instructions at home: °Medicines °· Take over-the-counter and prescription medicines only as told by your health care provider. Medicines are important in reducing the workload of your heart, slowing the progression of heart failure, and improving your symptoms. °? Do not stop taking your medicine unless your health care provider told you to do that. °? Do not skip any dose of medicine. °? Refill your prescriptions before you run out of medicine. You need your medicines every day. °Eating and drinking ° °· Eat heart-healthy foods. Talk with a dietitian to make an eating plan that is right for you. °? Choose foods that contain no trans fat and are low in saturated fat and cholesterol. Healthy choices include fresh or frozen fruits and vegetables, fish, lean meats, legumes, fat-free or low-fat dairy products, and whole-grain or high-fiber foods. °? Limit salt (sodium) if  directed by your health care provider. Sodium restriction may reduce symptoms of heart failure. Ask a dietitian to recommend heart-healthy seasonings. °? Use healthy cooking methods instead of frying. Healthy methods include roasting, grilling, broiling, baking, poaching, steaming, and stir-frying. °· Limit your fluid intake if directed by your health care provider. Fluid restriction may reduce symptoms of heart failure. °Lifestyle °· Stop smoking or using chewing tobacco. Nicotine and tobacco can damage your heart and your blood vessels. Do not use nicotine gum or patches before talking to your health care provider. °· Limit alcohol intake to no more than 1 drink per day for non-pregnant women and 2 drinks per day for men. One drink equals 12 oz of beer, 5 oz of wine, or 1½ oz of hard liquor. °? Drinking more than that is harmful to your heart. Tell your health care provider if you drink alcohol several times a week. °? Talk with your health care provider about whether any level of alcohol use is safe for you. °? If your heart has already been damaged by alcohol or you have severe heart failure, drinking alcohol should be stopped completely. °· Stop use of illegal drugs. °· Lose weight if directed by your health care provider. Weight loss may reduce symptoms of heart failure. °· Do moderate physical activity if directed by your health care provider. People who are elderly and people with severe heart failure should consult with a health care provider for physical activity recommendations. °  Monitor important information °· Weigh yourself every day. Keeping track of your weight daily helps you to notice excess fluid sooner. °? Weigh yourself every morning after you urinate and before you eat breakfast. °? Wear the same amount of clothing each time you weigh yourself. °? Record your daily weight. Provide your health care provider with your weight record. °· Monitor and record your blood pressure as told by your health  care provider. °· Check your pulse as told by your health care provider. °Dealing with extreme temperatures °· If the weather is extremely hot: °? Avoid vigorous physical activity. °? Use air conditioning or fans or seek a cooler location. °? Avoid caffeine and alcohol. °? Wear loose-fitting, lightweight, and light-colored clothing. °· If the weather is extremely cold: °? Avoid vigorous physical activity. °? Layer your clothes. °? Wear mittens or gloves, a hat, and a scarf when you go outside. °? Avoid alcohol. °General instructions °· Manage other health conditions such as hypertension, diabetes, thyroid disease, or abnormal heart rhythms as told by your health care provider. °· Learn to manage stress. If you need help to do this, ask your health care provider. °· Plan rest periods when fatigued. °· Get ongoing education and support as needed. °· Participate in or seek rehabilitation as needed to maintain or improve independence and quality of life. °· Stay up to date with immunizations. Keeping current on pneumococcal and influenza immunizations is especially important to prevent respiratory infections. °· Keep all follow-up visits as told by your health care provider. This is important. °Contact a health care provider if: °· You have a rapid weight gain. °· You have increasing shortness of breath that is unusual for you. °· You are unable to participate in your usual physical activities. °· You tire easily. °· You cough more than normal, especially with physical activity. °· You have any swelling or more swelling in areas such as your hands, feet, ankles, or abdomen. °· You are unable to sleep because it is hard to breathe. °· You feel like your heart is beating quickly (palpitations). °· You become dizzy or light-headed when you stand up. °Get help right away if: °· You have difficulty breathing. °· You notice or your family notices a change in your awareness, such as having trouble staying awake or having  difficulty with concentration. °· You have pain or discomfort in your chest. °· You have an episode of fainting (syncope). °This information is not intended to replace advice given to you by your health care provider. Make sure you discuss any questions you have with your health care provider. °Document Released: 04/11/2005 Document Revised: 12/15/2015 Document Reviewed: 11/04/2015 °Elsevier Interactive Patient Education © 2017 [employer] ° °

## 2016-04-14 NOTE — Progress Notes (Signed)
Received consult for Medication Assistance; pt has private insurance with [facility] with prescription drug coverage; Alexis Goodell [phone]

## 2016-06-22 ENCOUNTER — Encounter: Payer: Self-pay | Admitting: Physician Assistant

## 2016-08-05 ENCOUNTER — Emergency Department (HOSPITAL_COMMUNITY): Payer: Medicare Other

## 2016-08-05 ENCOUNTER — Inpatient Hospital Stay (HOSPITAL_COMMUNITY)
Admission: EM | Admit: 2016-08-05 | Discharge: 2016-08-13 | DRG: 291 | Disposition: A | Discharge status: Skilled Nursing Facility | Payer: Medicare Other | Attending: Nephrology | Admitting: Nephrology

## 2016-08-05 ENCOUNTER — Encounter (HOSPITAL_COMMUNITY): Payer: Self-pay | Admitting: Emergency Medicine

## 2016-08-05 DIAGNOSIS — F411 Generalized anxiety disorder: Secondary | ICD-10-CM | POA: Diagnosis not present

## 2016-08-05 DIAGNOSIS — F1020 Alcohol dependence, uncomplicated: Secondary | ICD-10-CM | POA: Diagnosis present

## 2016-08-05 DIAGNOSIS — R7989 Other specified abnormal findings of blood chemistry: Secondary | ICD-10-CM | POA: Diagnosis not present

## 2016-08-05 DIAGNOSIS — Z87898 Personal history of other specified conditions: Secondary | ICD-10-CM | POA: Diagnosis not present

## 2016-08-05 DIAGNOSIS — R740 Nonspecific elevation of levels of transaminase and lactic acid dehydrogenase [LDH]: Secondary | ICD-10-CM | POA: Diagnosis not present

## 2016-08-05 DIAGNOSIS — E875 Hyperkalemia: Secondary | ICD-10-CM | POA: Diagnosis present

## 2016-08-05 DIAGNOSIS — Z833 Family history of diabetes mellitus: Secondary | ICD-10-CM | POA: Diagnosis not present

## 2016-08-05 DIAGNOSIS — Z7189 Other specified counseling: Secondary | ICD-10-CM | POA: Diagnosis not present

## 2016-08-05 DIAGNOSIS — R570 Cardiogenic shock: Secondary | ICD-10-CM | POA: Diagnosis not present

## 2016-08-05 DIAGNOSIS — I509 Heart failure, unspecified: Secondary | ICD-10-CM

## 2016-08-05 DIAGNOSIS — I252 Old myocardial infarction: Secondary | ICD-10-CM | POA: Diagnosis not present

## 2016-08-05 DIAGNOSIS — N179 Acute kidney failure, unspecified: Secondary | ICD-10-CM

## 2016-08-05 DIAGNOSIS — Z794 Long term (current) use of insulin: Secondary | ICD-10-CM | POA: Diagnosis not present

## 2016-08-05 DIAGNOSIS — Z7982 Long term (current) use of aspirin: Secondary | ICD-10-CM | POA: Diagnosis not present

## 2016-08-05 DIAGNOSIS — E11649 Type 2 diabetes mellitus with hypoglycemia without coma: Secondary | ICD-10-CM | POA: Diagnosis not present

## 2016-08-05 DIAGNOSIS — I10 Essential (primary) hypertension: Secondary | ICD-10-CM | POA: Diagnosis not present

## 2016-08-05 DIAGNOSIS — F1910 Other psychoactive substance abuse, uncomplicated: Secondary | ICD-10-CM | POA: Diagnosis not present

## 2016-08-05 DIAGNOSIS — F1420 Cocaine dependence, uncomplicated: Secondary | ICD-10-CM | POA: Diagnosis present

## 2016-08-05 DIAGNOSIS — E118 Type 2 diabetes mellitus with unspecified complications: Secondary | ICD-10-CM | POA: Diagnosis not present

## 2016-08-05 DIAGNOSIS — R0602 Shortness of breath: Secondary | ICD-10-CM | POA: Diagnosis not present

## 2016-08-05 DIAGNOSIS — I5043 Acute on chronic combined systolic (congestive) and diastolic (congestive) heart failure: Secondary | ICD-10-CM | POA: Diagnosis not present

## 2016-08-05 DIAGNOSIS — F17210 Nicotine dependence, cigarettes, uncomplicated: Secondary | ICD-10-CM | POA: Diagnosis present

## 2016-08-05 DIAGNOSIS — R945 Abnormal results of liver function studies: Secondary | ICD-10-CM

## 2016-08-05 DIAGNOSIS — I2510 Atherosclerotic heart disease of native coronary artery without angina pectoris: Secondary | ICD-10-CM | POA: Diagnosis not present

## 2016-08-05 DIAGNOSIS — Z79899 Other long term (current) drug therapy: Secondary | ICD-10-CM

## 2016-08-05 DIAGNOSIS — I110 Hypertensive heart disease with heart failure: Secondary | ICD-10-CM | POA: Diagnosis not present

## 2016-08-05 DIAGNOSIS — M6281 Muscle weakness (generalized): Secondary | ICD-10-CM | POA: Diagnosis not present

## 2016-08-05 DIAGNOSIS — F418 Other specified anxiety disorders: Secondary | ICD-10-CM | POA: Diagnosis present

## 2016-08-05 DIAGNOSIS — R0601 Orthopnea: Secondary | ICD-10-CM

## 2016-08-05 DIAGNOSIS — Z515 Encounter for palliative care: Secondary | ICD-10-CM | POA: Diagnosis not present

## 2016-08-05 DIAGNOSIS — F1911 Other psychoactive substance abuse, in remission: Secondary | ICD-10-CM

## 2016-08-05 DIAGNOSIS — E871 Hypo-osmolality and hyponatremia: Secondary | ICD-10-CM | POA: Diagnosis present

## 2016-08-05 DIAGNOSIS — B370 Candidal stomatitis: Secondary | ICD-10-CM | POA: Diagnosis not present

## 2016-08-05 DIAGNOSIS — I2583 Coronary atherosclerosis due to lipid rich plaque: Secondary | ICD-10-CM

## 2016-08-05 DIAGNOSIS — T502X5A Adverse effect of carbonic-anhydrase inhibitors, benzothiadiazides and other diuretics, initial encounter: Secondary | ICD-10-CM | POA: Diagnosis not present

## 2016-08-05 DIAGNOSIS — D649 Anemia, unspecified: Secondary | ICD-10-CM | POA: Diagnosis not present

## 2016-08-05 DIAGNOSIS — E1165 Type 2 diabetes mellitus with hyperglycemia: Secondary | ICD-10-CM | POA: Diagnosis not present

## 2016-08-05 DIAGNOSIS — N170 Acute kidney failure with tubular necrosis: Secondary | ICD-10-CM | POA: Diagnosis not present

## 2016-08-05 DIAGNOSIS — Z8249 Family history of ischemic heart disease and other diseases of the circulatory system: Secondary | ICD-10-CM

## 2016-08-05 LAB — BASIC METABOLIC PANEL
ANION GAP: 12 (ref 5–15)
BUN: 14 mg/dL (ref 6–20)
CHLORIDE: 86 mmol/L — AB (ref 101–111)
CO2: 20 mmol/L — AB (ref 22–32)
Calcium: 8.2 mg/dL — ABNORMAL LOW (ref 8.9–10.3)
Creatinine, Ser: 1.11 mg/dL (ref 0.61–1.24)
GFR calc non Af Amer: >60 mL/min (ref >60)
Glucose, Bld: 219 mg/dL — ABNORMAL HIGH (ref 65–99)
POTASSIUM: 5.4 mmol/L — AB (ref 3.5–5.1)
Sodium: 118 mmol/L — CL (ref 135–145)

## 2016-08-05 LAB — I-STAT TROPONIN, ED: TROPONIN I, POC: 0.03 ng/mL (ref 0.00–0.08)

## 2016-08-05 LAB — GLUCOSE, CAPILLARY: Glucose-Capillary: 224 mg/dL — ABNORMAL HIGH (ref 65–99)

## 2016-08-05 LAB — CBC WITH DIFFERENTIAL/PLATELET
BASOS ABS: 0 10*3/uL (ref 0.0–0.1)
Basophils Relative: 1 %
Eosinophils Absolute: 0 10*3/uL (ref 0.0–0.7)
Eosinophils Relative: 0 %
HEMATOCRIT: 35.5 % — AB (ref 39.0–52.0)
HEMOGLOBIN: 12.6 g/dL — AB (ref 13.0–17.0)
Lymphocytes Relative: 32 %
Lymphs Abs: 1.7 10*3/uL (ref 0.7–4.0)
MCH: 29.4 pg (ref 26.0–34.0)
MCHC: 35.5 g/dL (ref 30.0–36.0)
MCV: 82.9 fL (ref 78.0–100.0)
MONOS PCT: 9 %
Monocytes Absolute: 0.5 10*3/uL (ref 0.1–1.0)
NEUTROS ABS: 3.1 10*3/uL (ref 1.7–7.7)
NEUTROS PCT: 58 %
Platelets: 213 10*3/uL (ref 150–400)
RBC: 4.28 MIL/uL (ref 4.22–5.81)
RDW: 11.9 % (ref 11.5–15.5)
WBC: 5.3 10*3/uL (ref 4.0–10.5)

## 2016-08-05 LAB — BRAIN NATRIURETIC PEPTIDE: B Natriuretic Peptide: 2465.1 pg/mL — ABNORMAL HIGH (ref 0.0–100.0)

## 2016-08-05 LAB — ETHANOL: Alcohol, Ethyl (B): 74 mg/dL — ABNORMAL HIGH (ref <5)

## 2016-08-05 LAB — RAPID URINE DRUG SCREEN, HOSP PERFORMED
Amphetamines: NOT DETECTED
BARBITURATES: NOT DETECTED
BENZODIAZEPINES: NOT DETECTED
COCAINE: POSITIVE — AB
Opiates: NOT DETECTED
TETRAHYDROCANNABINOL: NOT DETECTED

## 2016-08-05 MED ORDER — TRAZODONE HCL 50 MG PO TABS
150.0000 mg | ORAL_TABLET | Freq: Every day | ORAL | Status: DC
  Administered 2016-08-05 – 2016-08-12 (×6): 150 mg via ORAL
  Filled 2016-08-05 (×2): qty 3
  Filled 2016-08-05 (×5): qty 1

## 2016-08-05 MED ORDER — LORAZEPAM 2 MG/ML IJ SOLN
1.0000 mg | Freq: Four times a day (QID) | INTRAMUSCULAR | Status: AC | PRN
  Administered 2016-08-06: 1 mg via INTRAVENOUS
  Filled 2016-08-05: qty 1

## 2016-08-05 MED ORDER — INSULIN GLARGINE 100 UNIT/ML ~~LOC~~ SOLN
40.0000 [IU] | Freq: Every day | SUBCUTANEOUS | Status: DC
  Administered 2016-08-06 – 2016-08-07 (×2): 40 [IU] via SUBCUTANEOUS
  Filled 2016-08-05 (×3): qty 0.4

## 2016-08-05 MED ORDER — ACETAMINOPHEN 325 MG PO TABS
650.0000 mg | ORAL_TABLET | ORAL | Status: DC | PRN
  Administered 2016-08-06: 650 mg via ORAL
  Filled 2016-08-05: qty 2

## 2016-08-05 MED ORDER — FOLIC ACID 1 MG PO TABS
1.0000 mg | ORAL_TABLET | Freq: Every day | ORAL | Status: DC
  Administered 2016-08-06 – 2016-08-13 (×8): 1 mg via ORAL
  Filled 2016-08-05 (×8): qty 1

## 2016-08-05 MED ORDER — LORAZEPAM 1 MG PO TABS
1.0000 mg | ORAL_TABLET | Freq: Four times a day (QID) | ORAL | Status: AC | PRN
  Administered 2016-08-05 – 2016-08-08 (×2): 1 mg via ORAL
  Filled 2016-08-05 (×2): qty 1

## 2016-08-05 MED ORDER — ENOXAPARIN SODIUM 40 MG/0.4ML ~~LOC~~ SOLN
40.0000 mg | Freq: Every day | SUBCUTANEOUS | Status: DC
  Administered 2016-08-06 – 2016-08-13 (×8): 40 mg via SUBCUTANEOUS
  Filled 2016-08-05 (×8): qty 0.4

## 2016-08-05 MED ORDER — ADULT MULTIVITAMIN W/MINERALS CH
1.0000 | ORAL_TABLET | Freq: Every day | ORAL | Status: DC
  Administered 2016-08-06 – 2016-08-13 (×8): 1 via ORAL
  Filled 2016-08-05 (×8): qty 1

## 2016-08-05 MED ORDER — SERTRALINE HCL 100 MG PO TABS
100.0000 mg | ORAL_TABLET | Freq: Every day | ORAL | Status: DC
  Administered 2016-08-05 – 2016-08-12 (×8): 100 mg via ORAL
  Filled 2016-08-05 (×8): qty 1

## 2016-08-05 MED ORDER — LORAZEPAM 2 MG/ML IJ SOLN
0.0000 mg | Freq: Two times a day (BID) | INTRAMUSCULAR | Status: DC
Start: 2016-08-09 — End: 2016-08-10
  Administered 2016-08-10: 1 mg via INTRAVENOUS
  Filled 2016-08-05: qty 1

## 2016-08-05 MED ORDER — HYDROXYZINE HCL 10 MG PO TABS
10.0000 mg | ORAL_TABLET | Freq: Once | ORAL | Status: AC
  Administered 2016-08-05: 10 mg via ORAL
  Filled 2016-08-05: qty 1

## 2016-08-05 MED ORDER — ONDANSETRON HCL 4 MG/2ML IJ SOLN: 4.0000 mg | Freq: Four times a day (QID) | INTRAMUSCULAR | Status: DC | PRN

## 2016-08-05 MED ORDER — SODIUM CHLORIDE 0.9% FLUSH
3.0000 mL | Freq: Two times a day (BID) | INTRAVENOUS | Status: DC
  Administered 2016-08-06 – 2016-08-12 (×8): 3 mL via INTRAVENOUS

## 2016-08-05 MED ORDER — FUROSEMIDE 10 MG/ML IJ SOLN
40.0000 mg | Freq: Once | INTRAMUSCULAR | Status: AC
  Administered 2016-08-05: 40 mg via INTRAVENOUS
  Filled 2016-08-05: qty 4

## 2016-08-05 MED ORDER — LORAZEPAM 2 MG/ML IJ SOLN: 0.0000 mg | Freq: Four times a day (QID) | INTRAMUSCULAR | Status: DC

## 2016-08-05 MED ORDER — FUROSEMIDE 10 MG/ML IJ SOLN
40.0000 mg | Freq: Two times a day (BID) | INTRAMUSCULAR | Status: DC
  Administered 2016-08-06: 40 mg via INTRAVENOUS
  Filled 2016-08-05: qty 4

## 2016-08-05 MED ORDER — PRAVASTATIN SODIUM 20 MG PO TABS
10.0000 mg | ORAL_TABLET | Freq: Every day | ORAL | Status: DC
  Administered 2016-08-06 – 2016-08-13 (×8): 10 mg via ORAL
  Filled 2016-08-05 (×8): qty 1

## 2016-08-05 MED ORDER — SODIUM CHLORIDE 0.9% FLUSH: 3.0000 mL | INTRAVENOUS | Status: DC | PRN

## 2016-08-05 MED ORDER — SODIUM CHLORIDE 0.9 % IV SOLN
250.0000 mL | INTRAVENOUS | Status: DC | PRN
  Administered 2016-08-11: 10 mL via INTRAVENOUS

## 2016-08-05 MED ORDER — FUROSEMIDE 10 MG/ML IJ SOLN: 40.0000 mg | Freq: Two times a day (BID) | INTRAMUSCULAR | Status: DC

## 2016-08-05 MED ORDER — INSULIN ASPART 100 UNIT/ML ~~LOC~~ SOLN
0.0000 [IU] | Freq: Every day | SUBCUTANEOUS | Status: DC
  Administered 2016-08-05: 2 [IU] via SUBCUTANEOUS

## 2016-08-05 MED ORDER — INSULIN ASPART 100 UNIT/ML ~~LOC~~ SOLN
0.0000 [IU] | Freq: Three times a day (TID) | SUBCUTANEOUS | Status: DC
  Administered 2016-08-06: 8 [IU] via SUBCUTANEOUS
  Administered 2016-08-06: 2 [IU] via SUBCUTANEOUS

## 2016-08-05 MED ORDER — VITAMIN B-1 100 MG PO TABS
100.0000 mg | ORAL_TABLET | Freq: Every day | ORAL | Status: DC
  Administered 2016-08-06 – 2016-08-13 (×8): 100 mg via ORAL
  Filled 2016-08-05 (×8): qty 1

## 2016-08-05 NOTE — H&P (Signed)
History and Physical    [name] ANV:[id number] DOB: 1956-01-06 DOA: 08/05/2016  PCP: [facility]   Patient coming from: Home  Chief Complaint: SOB, orthopnea, anxiety   HPI: [name] is a 61 y.o. male with medical history significant for chronic combined systolic/diastolic CHF, nonobstructive CAD, depression with anxiety, hypertension, and insulin-dependent diabetes mellitus presents the emergency department with 2-3 days of progressive exertional dyspnea and orthopnea. Patient reports that he had been in his usual state until 2 or 3 days ago when he noted increasing dyspnea with exertion. He also noted worsening orthopnea at that time and all of this has steadily worsened since onset. He reports sleeping poorly last night due to   dyspnea despite sleeping upright. He continues to be short of breath while at rest today. He denies any chest pain or palpitations and denies any significant swelling or tenderness in the lower extremities. He reports a dry cough. He denies fevers or chills. He reports continued daily alcohol use, drinking two 40 oz bottles of malt liquor daily. He reports ongoing cocaine abuse, both insufflated and smoked, with last use yesterday.   ED Course: Upon arrival to the ED, patient is found to be afebrile, saturating adequately on room air, mildly tachypneic, and with vitals otherwise stable. EKG features a sinus rhythm with nonspecific interventricular conduction delay. Chest x-ray is notable for chronic bilateral effusions and chronic bilateral lower lobe airspace opacities. Effusions appear slightly larger than the study from December 2017. Chemistry panel features a sodium of 118, potassium 5.4, and glucose of 219. CBC features a mild normocytic anemia with hemoglobin of 12.6. Troponin is within the normal limits and BNP is elevated to a value of 2465. Patient remained tachypneic and dyspneic while at rest in the ED, but hemodynamically stable. He will  be admitted to the telemetry unit for ongoing evaluation and management of dyspnea and orthopnea secondary to acute on chronic combined systolic/diastolic CHF, complicated by electrolyte derangements.  Review of Systems:  All other systems reviewed and apart from HPI, are negative.  Past Medical History:  Diagnosis Date  . CHF (congestive heart failure) (HCC)   . Coronary artery disease   . Depression   . Diabetes mellitus   . Hypertension   . Mental disorder   . MI (myocardial infarction) 36   age 42 per patient    Past Surgical History:  Procedure Laterality Date  . ANGIOPLASTY  1984   at age 73  . LEFT AND RIGHT HEART CATHETERIZATION WITH CORONARY/GRAFT ANGIOGRAM  08/13/2012   Procedure: LEFT AND RIGHT HEART CATHETERIZATION WITH [name];  Surgeon: Kathleene Hazel, MD;  Location: [facility] CATH [facility];  Service: Cardiovascular;;     reports that he has been smoking Cigarettes.  He has been smoking about 0.50 packs per day. He has never used smokeless tobacco. He reports that he uses drugs, including Cocaine and Marijuana, about 7 times per week. He reports that he does not drink alcohol.  No Known Allergies  Family History  Problem Relation Age of Onset  . Heart attack Father     >60s  . Hypertension Father   . Diabetes Father   . Heart attack Sister   . Hypertension Mother      Prior to Admission medications   Medication Sig Start Date End Date Taking? Authorizing Provider  carvedilol (COREG) 3.125 MG tablet Take 1 tablet (3.125 mg total) by mouth 2 (two) times daily with a meal. 04/14/16   Maryann  Mikhail, DO  feeding supplement, GLUCERNA SHAKE, (GLUCERNA SHAKE) LIQD Take 237 mLs by mouth 3 (three) times daily between meals. Patient not taking: Reported on 04/14/2016 02/27/14   Maryruth Bun Rama, MD  folic acid (FOLVITE) 1 MG tablet Take 1 tablet (1 mg total) by mouth daily. Patient not taking: Reported on 04/14/2016 02/27/14   Maryruth Bun Rama, MD    furosemide (LASIX) 40 MG tablet Take 1 tablet (40 mg total) by mouth 2 (two) times daily. 04/14/16   Maryann Mikhail, DO  guaiFENesin (MUCINEX) 600 MG 12 hr tablet Take 1 tablet (600 mg total) by mouth 2 (two) times daily. Patient not taking: Reported on 04/14/2016 09/15/15   Albertine Grates, MD  Insulin Glargine (LANTUS SOLOSTAR) 100 UNIT/ML Solostar Pen Inject 20 Units into the skin every morning. Patient taking differently: Inject 40 Units into the skin every morning.  09/16/15   Jeralyn Bennett, MD  lisinopril (PRINIVIL,ZESTRIL) 10 MG tablet Take 1 tablet (10 mg total) by mouth daily. 04/15/16   Maryann Mikhail, DO  magnesium oxide (MAG-OX) 400 (241.3 Mg) MG tablet Take 1 tablet (400 mg total) by mouth daily. Patient not taking: Reported on 04/14/2016 09/15/15   Albertine Grates, MD  metFORMIN (GLUCOPHAGE) 1000 MG tablet Take 0.5 tablets (500 mg total) by mouth 2 (two) times daily with a meal. For high blood sugar control Patient taking differently: Take 1,000 mg by mouth 2 (two) times daily with a meal. For high blood sugar control 12/27/12   Sanjuana Kava, NP  Multiple Vitamin (MULTIVITAMIN WITH MINERALS) TABS tablet Take 1 tablet by mouth daily. 02/27/14   Maryruth Bun Rama, MD  pravastatin (PRAVACHOL) 10 MG tablet Take 1 tablet (10 mg total) by mouth daily. For high cholesterol control 09/16/15   Jeralyn Bennett, MD  sertraline (ZOLOFT) 100 MG tablet Take 100 mg by mouth at bedtime.     Historical Provider, MD  spironolactone (ALDACTONE) 25 MG tablet Take 25 mg by mouth daily.    Historical Provider, MD  thiamine 100 MG tablet Take 1 tablet (100 mg total) by mouth daily. Patient not taking: Reported on 04/14/2016 02/27/14   Maryruth Bun Rama, MD  traZODone (DESYREL) 100 MG tablet Take 1 tablet (100 mg total) by mouth at bedtime. For sleep Patient taking differently: Take 150 mg by mouth at bedtime. For sleep 12/27/12   Sanjuana Kava, NP    Physical Exam: Vitals:   08/05/16 2030 08/05/16 2045 08/05/16 2100  08/05/16 2130  BP: 124/85 (!) 135/99 (!) 128/91 (!) 143/95  Pulse: 67 67 77 67  Resp: (!) 21 18 (!) 24 (!) 27  Temp:      SpO2: 98% 97% 93% 100%  Weight:      Height:          Constitutional: Tachypneic, dyspneic with speech, no pallor or cyanosis  Eyes: PERTLA, lids and conjunctivae normal ENMT: Mucous membranes are moist. Posterior pharynx clear of any exudate or lesions.   Neck: normal, supple, no masses, no thyromegaly Respiratory: Diminished at bases. No wheeze or rhonchi. Increased WOB.  Cardiovascular: S1 & S2 heard, regular rate and rhythm. No extremity edema. JVP 11 cmH20. Abdomen: No distension, no tenderness, no masses palpated. Bowel sounds normal.  Musculoskeletal: no clubbing / cyanosis. No joint deformity upper and lower extremities.    Skin: no significant rashes, lesions, ulcers. Warm, dry, well-perfused. Neurologic: CN 2-12 grossly intact. Sensation intact, DTR normal. Strength 5/5 in all 4 limbs.  Psychiatric: Alert and oriented x 3.  Calm and cooperative.     Labs on Admission: I have personally reviewed following labs and imaging studies  CBC:  Recent Labs Lab 08/05/16 1921  WBC 5.3  NEUTROABS 3.1  HGB 12.6*  HCT 35.5*  MCV 82.9  PLT 213   Basic Metabolic Panel:  Recent Labs Lab 08/05/16 1921  NA 118*  K 5.4*  CL 86*  CO2 20*  GLUCOSE 219*  BUN 14  CREATININE 1.11  CALCIUM 8.2*   GFR: Estimated Creatinine Clearance: 69.1 mL/min (by C-G formula based on SCr of 1.11 mg/dL). Liver Function Tests: No results for input(s): AST, ALT, ALKPHOS, BILITOT, PROT, ALBUMIN in the last 168 hours. No results for input(s): LIPASE, AMYLASE in the last 168 hours. No results for input(s): AMMONIA in the last 168 hours. Coagulation Profile: No results for input(s): INR, PROTIME in the last 168 hours. Cardiac Enzymes: No results for input(s): CKTOTAL, CKMB, CKMBINDEX, TROPONINI in the last 168 hours. BNP (last 3 results) No results for input(s): PROBNP  in the last 8760 hours. HbA1C: No results for input(s): HGBA1C in the last 72 hours. CBG: No results for input(s): GLUCAP in the last 168 hours. Lipid Profile: No results for input(s): CHOL, HDL, LDLCALC, TRIG, CHOLHDL, LDLDIRECT in the last 72 hours. Thyroid Function Tests: No results for input(s): TSH, T4TOTAL, FREET4, T3FREE, THYROIDAB in the last 72 hours. Anemia Panel: No results for input(s): VITAMINB12, FOLATE, FERRITIN, TIBC, IRON, RETICCTPCT in the last 72 hours. Urine analysis:    Component Value Date/Time   COLORURINE YELLOW 12/25/2015 1210   APPEARANCEUR CLEAR 12/25/2015 1210   LABSPEC 1.015 12/25/2015 1210   PHURINE 5.5 12/25/2015 1210   GLUCOSEU 500 (A) 12/25/2015 1210   HGBUR NEGATIVE 12/25/2015 1210   BILIRUBINUR NEGATIVE 12/25/2015 1210   KETONESUR NEGATIVE 12/25/2015 1210   PROTEINUR NEGATIVE 12/25/2015 1210   UROBILINOGEN 2.0 (H) 02/20/2014 1339   NITRITE NEGATIVE 12/25/2015 1210   LEUKOCYTESUR NEGATIVE 12/25/2015 1210   Sepsis Labs: @LABRCNTIP (procalcitonin:4,lacticidven:4) )No results found for this or any previous visit (from the past 240 hour(s)).   Radiological Exams on Admission: Dg Chest 2 View  Result Date: 08/05/2016 CLINICAL DATA:  Drowsiness/lethargy, shortness of breath. EXAM: CHEST  2 VIEW COMPARISON:  04/12/2016 FINDINGS: Moderate bilateral pleural effusions with bilateral lower lobe airspace opacities, some worsening compared to 04/12/16. Pneumonia cannot be excluded. Faint interstitial accentuation. Prominent retrosternal clear space compatible with emphysema. IMPRESSION: 1. Chronic bilateral pleural effusions and chronic bilateral lower lobe airspace opacities. Pleural effusions seems slightly larger than on 04/12/2016. Electronically Signed   By: Gaylyn Rong M.D.   On: 08/05/2016 20:21    EKG: Independently reviewed. Sinus rhythm, non-specific IVCD  Assessment/Plan  1. Acute on chronic combined CHF  - Pt presents with 2-3 days of  worsening DOE and orthopnea  - Noted to have marked neck vein distension, marked elevation in BNP  - TTE (04/11/16) with EF 20-25%, grade 2 diastolic dysfunction, severe diffuse HK, regional AK, mild MR, and mild LAE - He is managed at home with Lasix 40 BID, Coreg, and lisinopril  - He is given Lasix 40 mg IV on admission with plan to continue q12h pending clinical course  - Coreg is held in light of ongoing cocaine use; lisinopril held in light of elevated potassium  - SLIV, fluid-restrict diet, follow daily wts, strict I/O's, chem panel qAM, update echo   2. Hyponatremia  - Serum sodium is 118 on admission  - Likely secondary to beer drinker's potomania, with CHF  likely contributing  - Check osmolality  - Will need to be followed closely during diuresis     3. Cocaine and alcohol dependence  - Pt reports ongoing daily alcohol use of roughly 8 drinks/day, and frequent cocaine use  - Counseled toward cessation  - Monitor with CIWA and prn Ativan  - Social work consultation requested   4. Insulin-dependent DM  - A1c was 12.0% in December 2017  - Managed at home with metformin and Lantus 40 units qAM  - Check CBG with meals and qHS  - Continue Lantus 40 units qAM with moderate-intensity sliding-scale correctional    5. Depression, anxiety  - Pt reports anxiety to worse lately d/t dyspnea  - Continue Zoloft qD, Ativan according to CIWA scoring    6. Hypertension  - BP at goal  - Managed at home with Coreg and lisinopril at home - Coreg held given ongoing cocaine use and lisinopril held given the hyperkalemia   7. Hyperkalemia  - Serum potassium slightly elevated to 5.4 on admission  - Hold lisinopril and Aldactone  - Given Lasix 40 mg IV in ED, monitoring on telemetry, repeat chem panel in am     DVT prophylaxis: sq Lovenox  Code Status: Full  Family Communication: Discussed with patient Disposition Plan: Admit to telemetry Consults called: None Admission status: Inpatient     Briscoe Deutscher, MD [facility] Pager [phone]  If 7PM-7AM, please contact night-coverage [email or web address] Password [facility]  08/05/2016, 9:58 PM

## 2016-08-05 NOTE — ED Provider Notes (Signed)
[facility]-EMERGENCY DEPT Provider Note   CSN: [id number] Arrival date & time: 08/05/16  1916     History   Chief Complaint Chief Complaint  Patient presents with  . Shortness of Breath    HPI [name] is a 61 y.o. male.  The history is provided by the patient and medical records.  Shortness of Breath  This is a new problem. The average episode lasts 2 days. The problem occurs continuously.The current episode started 2 days ago. The problem has been gradually worsening. Associated symptoms include cough and orthopnea. Pertinent negatives include no fever, no sore throat, no ear pain, no chest pain, no vomiting, no abdominal pain, no rash and no leg swelling. It is unknown what precipitated the problem. Risk factors include smoking. He has tried nothing for the symptoms. The treatment provided no relief. He has had prior hospitalizations. He has had prior ED visits. Associated medical issues include CAD and heart failure.    Past Medical History:  Diagnosis Date  . CHF (congestive heart failure) (HCC)   . Coronary artery disease   . Depression   . Diabetes mellitus   . Hypertension   . Mental disorder   . MI (myocardial infarction) [facility]) 30   age 28 per patient    Patient Active Problem List   Diagnosis Date Noted  . Depression with anxiety 08/05/2016  . Malnutrition of moderate degree 04/12/2016  . Type 2 diabetes mellitus with hyperglycemia, with long-term current use of insulin (HCC) 04/12/2016  . Cough   . DNR (do not resuscitate) discussion   . Palliative care encounter   . Polysubstance abuse   . Pleural effusion   . Insulin dependent diabetes mellitus (HCC)   . Hepatitis, viral   . Pleural effusion on right   . CHF (congestive heart failure) (HCC) 09/07/2015  . Acute on chronic combined systolic and diastolic CHF (congestive heart failure) (HCC) 09/07/2015  . Elevated troponin I level 03/10/2015  . Alcohol withdrawal (HCC)   . Type 2 diabetes mellitus  with complication (HCC)   . 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 Acute respiratory failure with hypoxia (HCC)   . Alcohol abuse with intoxication (HCC)   . Essential hypertension   . Acute on chronic systolic CHF (congestive heart failure) (HCC)   . 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 Alcohol dependence with withdrawal delirium (HCC) 02/23/2014  . Hyponatremia 02/23/2014  . Transaminitis 02/22/2014  . Protein-calorie malnutrition (HCC) 02/22/2014  . Other pancytopenia (HCC) 02/22/2014  . Hyperkalemia 02/22/2014  . Hypomagnesemia 02/22/2014  . Alcoholic ketoacidosis 02/20/2014  . Acute respiratory failure (HCC) 02/20/2014  . Systolic and diastolic CHF, acute on chronic (HCC) 02/20/2014  . ETOH abuse 12/21/2012  . Cocaine dependence (HCC) 12/21/2012  . Substance induced mood disorder (HCC) 12/21/2012  . Non Obstructive CAD (coronary artery disease) 08/14/2012  . Cardiomyopathy, nonischemic (HCC) 08/14/2012  . Systolic and diastolic CHF, acute (HCC) 08/14/2012  . Diabetes mellitus, type II (HCC) 08/11/2012  . Hypertension 08/11/2012  . History of substance abuse 08/11/2012    Past Surgical History:  Procedure Laterality Date  . ANGIOPLASTY  1984   at age 75  . LEFT AND RIGHT HEART CATHETERIZATION WITH CORONARY/GRAFT ANGIOGRAM  08/13/2012   Procedure: LEFT AND RIGHT HEART CATHETERIZATION WITH [name];  Surgeon: Kathleene Hazel, MD;  Location: [facility] CATH LAB;  Service: Cardiovascular;;       Home Medications    Prior to Admission medications   Medication Sig Start Date  End Date Taking? Authorizing Provider  carvedilol (COREG) 3.125 MG tablet Take 1 tablet (3.125 mg total) by mouth 2 (two) times daily with a meal. 04/14/16   Maryann Mikhail, DO  feeding supplement, GLUCERNA SHAKE, (GLUCERNA SHAKE) LIQD Take 237 mLs by mouth 3 (three) times daily between meals. Patient not taking: Reported on 04/14/2016 02/27/14   Maryruth Bun Rama, MD  folic  acid (FOLVITE) 1 MG tablet Take 1 tablet (1 mg total) by mouth daily. Patient not taking: Reported on 04/14/2016 02/27/14   Maryruth Bun Rama, MD  furosemide (LASIX) 40 MG tablet Take 1 tablet (40 mg total) by mouth 2 (two) times daily. 04/14/16   Maryann Mikhail, DO  guaiFENesin (MUCINEX) 600 MG 12 hr tablet Take 1 tablet (600 mg total) by mouth 2 (two) times daily. Patient not taking: Reported on 04/14/2016 09/15/15   Albertine Grates, MD  Insulin Glargine (LANTUS SOLOSTAR) 100 UNIT/ML Solostar Pen Inject 20 Units into the skin every morning. Patient taking differently: Inject 40 Units into the skin every morning.  09/16/15   Jeralyn Bennett, MD  lisinopril (PRINIVIL,ZESTRIL) 10 MG tablet Take 1 tablet (10 mg total) by mouth daily. 04/15/16   Maryann Mikhail, DO  magnesium oxide (MAG-OX) 400 (241.3 Mg) MG tablet Take 1 tablet (400 mg total) by mouth daily. Patient not taking: Reported on 04/14/2016 09/15/15   Albertine Grates, MD  metFORMIN (GLUCOPHAGE) 1000 MG tablet Take 0.5 tablets (500 mg total) by mouth 2 (two) times daily with a meal. For high blood sugar control Patient taking differently: Take 1,000 mg by mouth 2 (two) times daily with a meal. For high blood sugar control 12/27/12   Sanjuana Kava, NP  Multiple Vitamin (MULTIVITAMIN WITH MINERALS) TABS tablet Take 1 tablet by mouth daily. 02/27/14   Maryruth Bun Rama, MD  pravastatin (PRAVACHOL) 10 MG tablet Take 1 tablet (10 mg total) by mouth daily. For high cholesterol control 09/16/15   Jeralyn Bennett, MD  sertraline (ZOLOFT) 100 MG tablet Take 100 mg by mouth at bedtime.     Historical Provider, MD  spironolactone (ALDACTONE) 25 MG tablet Take 25 mg by mouth daily.    Historical Provider, MD  thiamine 100 MG tablet Take 1 tablet (100 mg total) by mouth daily. Patient not taking: Reported on 04/14/2016 02/27/14   Maryruth Bun Rama, MD  traZODone (DESYREL) 100 MG tablet Take 1 tablet (100 mg total) by mouth at bedtime. For sleep Patient taking differently: Take  150 mg by mouth at bedtime. For sleep 12/27/12   Sanjuana Kava, NP    Family History Family History  Problem Relation Age of Onset  . Heart attack Father     >60s  . Hypertension Father   . Diabetes Father   . Heart attack Sister   . Hypertension Mother     Social History Social History  Substance Use Topics  . Smoking status: Current Every Day Smoker    Packs/day: 0.50    Types: Cigarettes  . Smokeless tobacco: Never Used  . Alcohol use No     Comment: sts he hasn't drunk in 6 months      Allergies   Patient has no known allergies.   Review of Systems Review of Systems  Constitutional: Negative for chills and fever.  HENT: Negative for ear pain and sore throat.   Eyes: Negative for pain and visual disturbance.  Respiratory: Positive for cough and shortness of breath.   Cardiovascular: Positive for orthopnea. Negative for chest pain, palpitations  and leg swelling.  Gastrointestinal: Negative for abdominal pain and vomiting.  Genitourinary: Negative for dysuria and hematuria.  Musculoskeletal: Negative for arthralgias and back pain.  Skin: Negative for color change and rash.  Neurological: Negative for seizures and syncope.  Psychiatric/Behavioral: Positive for agitation.  All other systems reviewed and are negative.    Physical Exam Updated Vital Signs BP 126/71 (BP Location: Left Arm)   Pulse 63   Temp 98.2 F (36.8 C)   Resp 20   Ht 6\' 3"  (1.905 m)   Wt 78.5 kg   SpO2 98%   BMI 21.62 kg/m   Physical Exam  Constitutional: He is oriented to person, place, and time. He appears well-developed and well-nourished.  HENT:  Head: Normocephalic and atraumatic.  Eyes: Conjunctivae are normal.  Neck: Neck supple.  Cardiovascular: Normal rate and regular rhythm.   No murmur heard. Pulmonary/Chest: Breath sounds normal. No respiratory distress.  Tachypneic  Abdominal: Soft. There is no tenderness.  Musculoskeletal: He exhibits no edema.  Neurological: He is  alert and oriented to person, place, and time.  Skin: Skin is warm and dry.  Psychiatric: He has a normal mood and affect.  Anxious  Nursing note and vitals reviewed.    ED Treatments / Results  Labs (all labs ordered are listed, but only abnormal results are displayed) Labs Reviewed  CBC WITH DIFFERENTIAL/PLATELET - Abnormal; Notable for the following:       Result Value   Hemoglobin 12.6 (*)    HCT 35.5 (*)    All other components within normal limits  BASIC METABOLIC PANEL - Abnormal; Notable for the following:    Sodium 118 (*)    Potassium 5.4 (*)    Chloride 86 (*)    CO2 20 (*)    Glucose, Bld 219 (*)    Calcium 8.2 (*)    All other components within normal limits  BRAIN NATRIURETIC PEPTIDE - Abnormal; Notable for the following:    B Natriuretic Peptide 2,465.1 (*)    All other components within normal limits  ETHANOL - Abnormal; Notable for the following:    Alcohol, Ethyl (B) 74 (*)    All other components within normal limits  RAPID URINE DRUG SCREEN, HOSP PERFORMED - Abnormal; Notable for the following:    Cocaine POSITIVE (*)    All other components within normal limits  GLUCOSE, CAPILLARY - Abnormal; Notable for the following:    Glucose-Capillary 224 (*)    All other components within normal limits  OSMOLALITY  BASIC METABOLIC PANEL  CBC WITH DIFFERENTIAL/PLATELET  HEMOGLOBIN A1C  BASIC METABOLIC PANEL  I-STAT TROPOININ, ED    EKG  EKG Interpretation  Date/Time:  Friday August 05 2016 20:18:33 EDT Ventricular Rate:  62 PR Interval:    QRS Duration: 113 QT Interval:  436 QTC Calculation: 443 R Axis:   64 Text Interpretation:  Sinus rhythm Probable left atrial enlargement Borderline intraventricular conduction delay Low voltage, extremity leads Borderline repolarization abnormality No significant change since last tracing Confirmed by Kandis Mannan ([id number]) on 08/05/2016 8:21:12 PM       Radiology Dg Chest 2 View  Result Date:  08/05/2016 CLINICAL DATA:  Drowsiness/lethargy, shortness of breath. EXAM: CHEST  2 VIEW COMPARISON:  04/12/2016 FINDINGS: Moderate bilateral pleural effusions with bilateral lower lobe airspace opacities, some worsening compared to 04/12/16. Pneumonia cannot be excluded. Faint interstitial accentuation. Prominent retrosternal clear space compatible with emphysema. IMPRESSION: 1. Chronic bilateral pleural effusions and chronic bilateral lower lobe airspace opacities.  Pleural effusions seems slightly larger than on 04/12/2016. Electronically Signed   By: Gaylyn Rong M.D.   On: 08/05/2016 20:21    Procedures Procedures (including critical care time)  Medications Ordered in ED Medications  sertraline (ZOLOFT) tablet 100 mg (100 mg Oral Given 08/05/16 2320)  pravastatin (PRAVACHOL) tablet 10 mg (not administered)  folic acid (FOLVITE) tablet 1 mg (not administered)  multivitamin with minerals tablet 1 tablet (not administered)  thiamine (VITAMIN B-1) tablet 100 mg (not administered)  traZODone (DESYREL) tablet 150 mg (150 mg Oral Given 08/05/16 2320)  LORazepam (ATIVAN) tablet 1 mg (1 mg Oral Given 08/05/16 2320)    Or  LORazepam (ATIVAN) injection 1 mg ( Intravenous See Alternative 08/05/16 2320)  sodium chloride flush (NS) 0.9 % injection 3 mL (3 mLs Intravenous Not Given 08/05/16 2300)  sodium chloride flush (NS) 0.9 % injection 3 mL (not administered)  0.9 %  sodium chloride infusion (not administered)  acetaminophen (TYLENOL) tablet 650 mg (not administered)  ondansetron (ZOFRAN) injection 4 mg (not administered)  LORazepam (ATIVAN) injection 0-4 mg (not administered)    Followed by  LORazepam (ATIVAN) injection 0-4 mg (not administered)  enoxaparin (LOVENOX) injection 40 mg (not administered)  insulin glargine (LANTUS) injection 40 Units (not administered)  insulin aspart (novoLOG) injection 0-15 Units (not administered)  insulin aspart (novoLOG) injection 0-5 Units (2 Units  Subcutaneous Given 08/05/16 2339)  furosemide (LASIX) injection 40 mg (not administered)  hydrOXYzine (ATARAX/VISTARIL) tablet 10 mg (10 mg Oral Given 08/05/16 2053)  furosemide (LASIX) injection 40 mg (40 mg Intravenous Given 08/05/16 2321)     Initial Impression / Assessment and Plan / ED Course  I have reviewed the triage vital signs and the nursing notes.  Pertinent labs & imaging results that were available during my care of the patient were reviewed by me and considered in my medical decision making (see chart for details).    Pt with h/o CAD, CHF, depression, anxiety presents with SOB. Says his symptoms started 2days ago and have been constant. In addition to the SOB, he reports increasing orthopnea that has also been getting worse. Says he's been taking all of his medications as directed, but ran out of his anxiety meds yesterday which is making his SOB worse. Endorses a mild cough, but denies F/C, HA, CP, N/V/D, urinary symptoms, or recent illness.  VS & exam as above. EKG: SR @ 62bpm w/nonspecific IV conduction delay & no signs of ischemia. CXR with b/l pleural effusion that appear larger than on previous images. Labs remarkable for Na 118, K 5.4, Cl 86, HCO3 20, Crt 1.11, Trop 0.03, BNP 2465, & Hgb 12.6.  Opted not to give furosemide in the ED to avoid worsening of his hyponatremia.  Will admit the Pt to the Hospitalist's service for further evaluation and management of CHF exacerbation.  Final Clinical Impressions(s) / ED Diagnoses   Final diagnoses:  Shortness of breath  Orthopnea  Acute on chronic congestive heart failure, unspecified congestive heart failure type [facility])    New Prescriptions Current Discharge Medication List       Forest Becker, MD 08/06/16 0058    Courteney Randall An, MD 08/07/16 1122

## 2016-08-05 NOTE — ED Triage Notes (Signed)
Pt brought to ED by [facility] from home for SOB, denies any fever or chills, no cp at this time, very drowsy at arrival to ED, Hx of CHF. VS BP 118/62, HR 60, R-18, SPO2 97% RA, CBG 242.

## 2016-08-06 ENCOUNTER — Inpatient Hospital Stay (HOSPITAL_COMMUNITY): Payer: Medicare Other

## 2016-08-06 LAB — CBC WITH DIFFERENTIAL/PLATELET
Basophils Absolute: 0 10*3/uL (ref 0.0–0.1)
Basophils Relative: 0 %
EOS ABS: 0 10*3/uL (ref 0.0–0.7)
EOS PCT: 0 %
HCT: 35 % — ABNORMAL LOW (ref 39.0–52.0)
Hemoglobin: 12.6 g/dL — ABNORMAL LOW (ref 13.0–17.0)
Lymphocytes Relative: 26 %
Lymphs Abs: 1.5 10*3/uL (ref 0.7–4.0)
MCH: 29.9 pg (ref 26.0–34.0)
MCHC: 36 g/dL (ref 30.0–36.0)
MCV: 82.9 fL (ref 78.0–100.0)
Monocytes Absolute: 0.4 10*3/uL (ref 0.1–1.0)
Monocytes Relative: 7 %
Neutro Abs: 3.8 10*3/uL (ref 1.7–7.7)
Neutrophils Relative %: 67 %
PLATELETS: 199 10*3/uL (ref 150–400)
RBC: 4.22 MIL/uL (ref 4.22–5.81)
RDW: 11.9 % (ref 11.5–15.5)
WBC: 5.6 10*3/uL (ref 4.0–10.5)

## 2016-08-06 LAB — BASIC METABOLIC PANEL
ANION GAP: 13 (ref 5–15)
Anion gap: 12 (ref 5–15)
BUN: 17 mg/dL (ref 6–20)
BUN: 23 mg/dL — ABNORMAL HIGH (ref 6–20)
CHLORIDE: 86 mmol/L — AB (ref 101–111)
CHLORIDE: 86 mmol/L — AB (ref 101–111)
CO2: 20 mmol/L — ABNORMAL LOW (ref 22–32)
CO2: 18 mmol/L — ABNORMAL LOW (ref 22–32)
CREATININE: 1.24 mg/dL (ref 0.61–1.24)
Calcium: 8.2 mg/dL — ABNORMAL LOW (ref 8.9–10.3)
Calcium: 8.1 mg/dL — ABNORMAL LOW (ref 8.9–10.3)
Creatinine, Ser: 1.99 mg/dL — ABNORMAL HIGH (ref 0.61–1.24)
GFR calc Af Amer: >60 mL/min (ref >60)
GFR calc Af Amer: 40 mL/min — ABNORMAL LOW (ref >60)
GFR calc non Af Amer: >60 mL/min (ref >60)
GFR, EST NON AFRICAN AMERICAN: 34 mL/min — AB (ref >60)
GLUCOSE: 71 mg/dL (ref 65–99)
Glucose, Bld: 195 mg/dL — ABNORMAL HIGH (ref 65–99)
POTASSIUM: 5.2 mmol/L — AB (ref 3.5–5.1)
Potassium: 4.6 mmol/L (ref 3.5–5.1)
SODIUM: 118 mmol/L — AB (ref 135–145)
SODIUM: 117 mmol/L — AB (ref 135–145)

## 2016-08-06 LAB — GLUCOSE, CAPILLARY
GLUCOSE-CAPILLARY: 139 mg/dL — AB (ref 65–99)
Glucose-Capillary: 273 mg/dL — ABNORMAL HIGH (ref 65–99)
Glucose-Capillary: 72 mg/dL (ref 65–99)
Glucose-Capillary: 120 mg/dL — ABNORMAL HIGH (ref 65–99)

## 2016-08-06 LAB — OSMOLALITY: Osmolality: 264 mOsm/kg — ABNORMAL LOW (ref 275–295)

## 2016-08-06 MED ORDER — FUROSEMIDE 10 MG/ML IJ SOLN: 40.0000 mg | Freq: Every day | INTRAMUSCULAR | Status: DC

## 2016-08-06 NOTE — Progress Notes (Signed)
Patient ID: [name], male   DOB: [birth year], 61 y.o.   MRN: [id number]                                                                PROGRESS NOTE                                                                                                                                                                                                             Patient Demographics:    [name], is a 61 y.o. male, DOB - 16-Jan-1956, ONG:[id number]  Admit date - 08/05/2016   Admitting Physician Briscoe Deutscher, MD  Outpatient Primary MD for the patient is [facility]  LOS - 1  Outpatient Specialists   Chief Complaint  Patient presents with  . Shortness of Breath       Brief Narrative  61 y.o. male with medical history significant for chronic combined systolic/diastolic CHF, nonobstructive CAD, depression with anxiety, hypertension, and insulin-dependent diabetes mellitus presents the emergency department with 2-3 days of progressive exertional dyspnea and orthopnea. Patient reports that he had been in his usual state until 2 or 3 days ago when he noted increasing dyspnea with exertion. He also noted worsening orthopnea at that time and all of this has steadily worsened since onset. He reports sleeping poorly last night due to   dyspnea despite sleeping upright. He continues to be short of breath while at rest today. He denies any chest pain or palpitations and denies any significant swelling or tenderness in the lower extremities. He reports a dry cough. He denies fevers or chills. He reports continued daily alcohol use, drinking two 40 oz bottles of malt liquor daily. He reports ongoing cocaine abuse, both insufflated and smoked, with last use yesterday.   ED Course: Upon arrival to the ED, patient is found to be afebrile, saturating adequately on room air, mildly tachypneic, and with vitals otherwise stable. EKG features a sinus rhythm with nonspecific interventricular conduction  delay. Chest x-ray is notable for chronic bilateral effusions and chronic bilateral lower lobe airspace opacities. Effusions appear slightly larger than the study from December 2017. Chemistry panel features a sodium of 118, potassium 5.4, and glucose of 219. CBC features a mild normocytic anemia with hemoglobin of 12.6. Troponin is within the  normal limits and BNP is elevated to a value of 2465. Patient remained tachypneic and dyspneic while at rest in the ED, but hemodynamically stable. He will be admitted to the telemetry unit for ongoing evaluation and management of dyspnea and orthopnea secondary to acute on chronic combined systolic/diastolic CHF, complicated by electrolyte derangements.    Subjective:    [name] today seems slightly somnolent, lying in bed,  No acute distress.  overnite slightly agitated, but doing better with lorazepam.   States breathing improving.   Denies cp, palp, orthopnea, pnd, lower ext edema.   Assessment  & Plan :    Principal Problem:   Acute on chronic combined systolic and diastolic CHF (congestive heart failure) (HCC) Active Problems:   Hypertension   History of substance abuse   Non Obstructive CAD (coronary artery disease)   Hyperkalemia   Hyponatremia   Polysubstance abuse   Type 2 diabetes mellitus with hyperglycemia, with long-term current use of insulin (HCC)   Depression with anxiety   1. Acute on chronic combined CHF ( TTE (04/11/16) with EF 20-25%, grade 2 diastolic dysfunction, severe diffuse HK, regional AK, mild MR, and mild LAE) Cont lasix 40mg  iv bid Strict I and O, check daily weight.  Carvedilol and lisinopril on hold.   Awaiting cardiac echo results  2. Hyponatremia  Serum sodium is 118 on admission , same this am Likely secondary to beer drinker's potomania, with CHF likely contributing  Check bmp later today and tomorrow am     3. Cocaine and alcohol dependence  Pt reports ongoing daily alcohol use of roughly 8  drinks/day, and frequent cocaine use  Counseled toward cessation  Monitor with CIWA and prn Ativan   4. Insulin-dependent DM  A1c was 12.0% in December 2017  Managed at home with metformin and Lantus 40 units qAM  Fsbs ac and qhs, ISS Continue Lantus 40 units qAM with moderate-intensity sliding-scale correctional    5. Depression, anxiety  Continue Zoloft qD, Ativan according to CIWA scoring    6. Hypertension  BP at goal  Managed at home with Coreg and lisinopril at home Coreg held given ongoing cocaine use and lisinopril held given the hyperkalemia   7. Hyperkalemia  - Serum potassium slightly elevated to 5.4 on admission  - Hold lisinopril and Aldactone  - Given Lasix 40 mg IV in ED, monitoring on telemetry, repeat chem panel in am     DVT prophylaxis: sq Lovenox  Code Status: Full  Family Communication: Discussed with patient Disposition Plan: Admit to telemetry Consults called: None Admission status: Inpatient      Lab Results  Component Value Date   PLT 199 08/06/2016    Antibiotics  :    Anti-infectives    None        Objective:   Vitals:   08/05/16 2145 08/05/16 2200 08/05/16 2228 08/06/16 0413  BP: 123/88 (!) 135/92 126/71 109/73  Pulse: 65 64 63 65  Resp: (!) 26 (!) 26 20 20   Temp:   97.3 F (36.3 C) 97.4 F (36.3 C)  TempSrc:   Oral Oral  SpO2: 99% 100% 98% 99%  Weight:   78.5 kg (173 lb) 78.6 kg (173 lb 4.8 oz)  Height:   6\' 3"  (1.905 m)     Wt Readings from Last 3 Encounters:  08/06/16 78.6 kg (173 lb 4.8 oz)  04/14/16 71.8 kg (158 lb 3.2 oz)  02/16/16 75.9 kg (167 lb 5.3 oz)     Intake/Output  Summary (Last 24 hours) at 08/06/16 0642 Last data filed at 08/06/16 0409  Gross per 24 hour  Intake              480 ml  Output              300 ml  Net              180 ml     Physical Exam  Awake Alert, Oriented X 3, No new F.N deficits, Normal affect [place].AT,PERRAL Supple Neck,No JVD, No cervical lymphadenopathy appriciated.   Symmetrical Chest wall movement, Good air movement bilaterally, slight decrease in bs at bilateral bases, slight crackles about 1/4 up bilateral lungs,  No wheezing RRR,No Gallops,Rubs or new Murmurs, No Parasternal Heave +ve B.Sounds, Abd Soft, No tenderness, No organomegaly appriciated, No rebound - guarding or rigidity. No Cyanosis, Clubbing or edema, No new Rash or bruise      Data Review:    CBC  Recent Labs Lab 08/05/16 1921 08/06/16 0103  WBC 5.3 5.6  HGB 12.6* 12.6*  HCT 35.5* 35.0*  PLT 213 199  MCV 82.9 82.9  MCH 29.4 29.9  MCHC 35.5 36.0  RDW 11.9 11.9  LYMPHSABS 1.7 1.5  MONOABS 0.5 0.4  EOSABS 0.0 0.0  BASOSABS 0.0 0.0    Chemistries   Recent Labs Lab 08/05/16 1921 08/06/16 0103  NA 118* 118*  K 5.4* 4.6  CL 86* 86*  CO2 20* 20*  GLUCOSE 219* 195*  BUN 14 17  CREATININE 1.11 1.24  CALCIUM 8.2* 8.2*   ------------------------------------------------------------------------------------------------------------------ No results for input(s): CHOL, HDL, LDLCALC, TRIG, CHOLHDL, LDLDIRECT in the last 72 hours.  Lab Results  Component Value Date   HGBA1C 12.0 (H) 04/13/2016   ------------------------------------------------------------------------------------------------------------------ No results for input(s): TSH, T4TOTAL, T3FREE, THYROIDAB in the last 72 hours.  Invalid input(s): FREET3 ------------------------------------------------------------------------------------------------------------------ No results for input(s): VITAMINB12, FOLATE, FERRITIN, TIBC, IRON, RETICCTPCT in the last 72 hours.  Coagulation profile No results for input(s): INR, PROTIME in the last 168 hours.  No results for input(s): DDIMER in the last 72 hours.  Cardiac Enzymes No results for input(s): CKMB, TROPONINI, MYOGLOBIN in the last 168 hours.  Invalid input(s):  CK ------------------------------------------------------------------------------------------------------------------    Component Value Date/Time   BNP 2,465.1 (H) 08/05/2016 1922    Inpatient Medications  Scheduled Meds: . enoxaparin (LOVENOX) injection  40 mg Subcutaneous Daily  . folic acid  1 mg Oral Daily  . furosemide  40 mg Intravenous BID  . insulin aspart  0-15 Units Subcutaneous TID WC  . insulin aspart  0-5 Units Subcutaneous QHS  . insulin glargine  40 Units Subcutaneous Daily  . LORazepam  0-4 mg Intravenous Q6H   Followed by  . [START ON 08/09/2016] LORazepam  0-4 mg Intravenous Q12H  . multivitamin with minerals  1 tablet Oral Daily  . pravastatin  10 mg Oral Daily  . sertraline  100 mg Oral QHS  . sodium chloride flush  3 mL Intravenous Q12H  . thiamine  100 mg Oral Daily  . traZODone  150 mg Oral QHS   Continuous Infusions: PRN Meds:.sodium chloride, acetaminophen, LORazepam **OR** LORazepam, ondansetron (ZOFRAN) IV, sodium chloride flush  Micro Results No results found for this or any previous visit (from the past 240 hour(s)).  Radiology Reports Dg Chest 2 View  Result Date: 08/05/2016 CLINICAL DATA:  Drowsiness/lethargy, shortness of breath. EXAM: CHEST  2 VIEW COMPARISON:  04/12/2016 FINDINGS: Moderate bilateral pleural effusions with bilateral lower lobe airspace  opacities, some worsening compared to 04/12/16. Pneumonia cannot be excluded. Faint interstitial accentuation. Prominent retrosternal clear space compatible with emphysema. IMPRESSION: 1. Chronic bilateral pleural effusions and chronic bilateral lower lobe airspace opacities. Pleural effusions seems slightly larger than on 04/12/2016. Electronically Signed   By: Gaylyn Rong M.D.   On: 08/05/2016 20:21    Time Spent in minutes  30   Pearson Grippe M.D on 08/06/2016 at 6:42 AM  Between 7am to 7pm - Pager - [phone]  After 7pm go to [email or web address] - password [email or web address]  [facility] -   Office  [phone]

## 2016-08-06 NOTE — Progress Notes (Signed)
Pt lethargic this shift. Pt now sitting up eating dinner. Pt states he feels better. Pt states he is not in pain.   [name]

## 2016-08-06 NOTE — Progress Notes (Signed)
MD called back, aware of sodium level stated he decreased lasix dose to help sodium levels. No new orders at this time  Tiffanie Blassingame

## 2016-08-06 NOTE — Progress Notes (Signed)
Paged MD regarding pt critical sodium level of 117, awaiting call back  Astrid Vides Elige Radon

## 2016-08-06 NOTE — Progress Notes (Signed)
Second page to MD regarding pt critical sodium level. Awaiting call back  Leeon Makar

## 2016-08-07 ENCOUNTER — Inpatient Hospital Stay (HOSPITAL_COMMUNITY): Payer: Medicare Other

## 2016-08-07 DIAGNOSIS — I509 Heart failure, unspecified: Secondary | ICD-10-CM

## 2016-08-07 DIAGNOSIS — N179 Acute kidney failure, unspecified: Secondary | ICD-10-CM

## 2016-08-07 LAB — GLUCOSE, CAPILLARY
GLUCOSE-CAPILLARY: 48 mg/dL — AB (ref 65–99)
GLUCOSE-CAPILLARY: 44 mg/dL — AB (ref 65–99)
GLUCOSE-CAPILLARY: 61 mg/dL — AB (ref 65–99)
GLUCOSE-CAPILLARY: 86 mg/dL (ref 65–99)
GLUCOSE-CAPILLARY: 155 mg/dL — AB (ref 65–99)
Glucose-Capillary: 156 mg/dL — ABNORMAL HIGH (ref 65–99)
Glucose-Capillary: 164 mg/dL — ABNORMAL HIGH (ref 65–99)
Glucose-Capillary: 169 mg/dL — ABNORMAL HIGH (ref 65–99)
Glucose-Capillary: 194 mg/dL — ABNORMAL HIGH (ref 65–99)
Glucose-Capillary: 68 mg/dL (ref 65–99)
Glucose-Capillary: 73 mg/dL (ref 65–99)
Glucose-Capillary: 83 mg/dL (ref 65–99)

## 2016-08-07 LAB — ECHOCARDIOGRAM COMPLETE
Area-P 1/2: 6.11 cm2
E decel time: 123 ms
E/e' ratio: 16.71
FS: 10 % — AB (ref 28–44)
Height: 75 in
IVS/LV PW RATIO, ED: 0.64
LA ID, A-P, ES: 48 mm
LA diam end sys: 48 mm
LA diam index: 2.37 cm/m2
LA vol A4C: 68.2 mL
LA vol index: 33.7 mL/m2
LA vol: 68.4 mL
LV E/e' medial: 16.71
LV E/e'average: 16.71
LV PW d: 12.1 mm — AB (ref 0.6–1.1)
LV dias vol index: 72 mL/m2
LV dias vol: 145 mL (ref 62–150)
LV e' LATERAL: 5.56 cm/s
LV sys vol index: 58 mL/m2
LV sys vol: 118 mL — AB (ref 21–61)
LVOT SV: 34 mL
LVOT VTI: 12.1 cm
LVOT area: 2.84 cm2
LVOT diameter: 19 mm
LVOT peak grad rest: 3 mmHg
LVOT peak vel: 81.6 cm/s
Lateral S' vel: 8.43 cm/s
MV Dec: 123
MV Peak grad: 3 mmHg
MV pk A vel: 20 m/s
MV pk E vel: 92.9 m/s
P 1/2 time: 36 ms
PISA EROA: 0.21 cm2
RV sys press: 69 mmHg
Reg peak vel: 369 cm/s
Simpson's disk: 19
Stroke v: 27 mL
TAPSE: 15.6 mm
TDI e' lateral: 5.56
TDI e' medial: 4
TR max vel: 369 cm/s
VTI: 133 cm
Weight: 2758.4 [oz_av]

## 2016-08-07 LAB — CBC
HCT: 36.5 % — ABNORMAL LOW (ref 39.0–52.0)
HEMATOCRIT: 35.6 % — AB (ref 39.0–52.0)
HEMOGLOBIN: 12.9 g/dL — AB (ref 13.0–17.0)
Hemoglobin: 13.1 g/dL (ref 13.0–17.0)
MCH: 29.6 pg (ref 26.0–34.0)
MCH: 29.8 pg (ref 26.0–34.0)
MCHC: 35.9 g/dL (ref 30.0–36.0)
MCHC: 36.2 g/dL — ABNORMAL HIGH (ref 30.0–36.0)
MCV: 82.4 fL (ref 78.0–100.0)
MCV: 82.2 fL (ref 78.0–100.0)
Platelets: 206 10*3/uL (ref 150–400)
Platelets: 204 10*3/uL (ref 150–400)
RBC: 4.43 MIL/uL (ref 4.22–5.81)
RBC: 4.33 MIL/uL (ref 4.22–5.81)
RDW: 11.9 % (ref 11.5–15.5)
RDW: 12.2 % (ref 11.5–15.5)
WBC: 7.5 10*3/uL (ref 4.0–10.5)
WBC: 7.1 10*3/uL (ref 4.0–10.5)

## 2016-08-07 LAB — COMPREHENSIVE METABOLIC PANEL WITH GFR
ALT: 97 U/L — ABNORMAL HIGH (ref 17–63)
AST: 217 U/L — ABNORMAL HIGH (ref 15–41)
Albumin: 2.6 g/dL — ABNORMAL LOW (ref 3.5–5.0)
Alkaline Phosphatase: 102 U/L (ref 38–126)
Anion gap: 12 (ref 5–15)
BUN: 32 mg/dL — ABNORMAL HIGH (ref 6–20)
CO2: 20 mmol/L — ABNORMAL LOW (ref 22–32)
Calcium: 8.3 mg/dL — ABNORMAL LOW (ref 8.9–10.3)
Chloride: 87 mmol/L — ABNORMAL LOW (ref 101–111)
Creatinine, Ser: 2.57 mg/dL — ABNORMAL HIGH (ref 0.61–1.24)
GFR calc Af Amer: 29 mL/min — ABNORMAL LOW
GFR calc non Af Amer: 25 mL/min — ABNORMAL LOW
Glucose, Bld: 74 mg/dL (ref 65–99)
Potassium: 5.5 mmol/L — ABNORMAL HIGH (ref 3.5–5.1)
Sodium: 119 mmol/L — CL (ref 135–145)
Total Bilirubin: 1.9 mg/dL — ABNORMAL HIGH (ref 0.3–1.2)
Total Protein: 6.2 g/dL — ABNORMAL LOW (ref 6.5–8.1)

## 2016-08-07 LAB — COMPREHENSIVE METABOLIC PANEL
ALBUMIN: 2.8 g/dL — AB (ref 3.5–5.0)
ALK PHOS: 101 U/L (ref 38–126)
ALT: 110 U/L — AB (ref 17–63)
AST: 254 U/L — AB (ref 15–41)
Anion gap: 15 (ref 5–15)
BUN: 32 mg/dL — ABNORMAL HIGH (ref 6–20)
CHLORIDE: 83 mmol/L — AB (ref 101–111)
CO2: 21 mmol/L — ABNORMAL LOW (ref 22–32)
CREATININE: 2.72 mg/dL — AB (ref 0.61–1.24)
Calcium: 8.4 mg/dL — ABNORMAL LOW (ref 8.9–10.3)
GFR calc Af Amer: 27 mL/min — ABNORMAL LOW (ref >60)
GFR, EST NON AFRICAN AMERICAN: 23 mL/min — AB (ref >60)
Glucose, Bld: 103 mg/dL — ABNORMAL HIGH (ref 65–99)
Potassium: 5.3 mmol/L — ABNORMAL HIGH (ref 3.5–5.1)
Sodium: 119 mmol/L — CL (ref 135–145)
Total Bilirubin: 1.8 mg/dL — ABNORMAL HIGH (ref 0.3–1.2)
Total Protein: 6.2 g/dL — ABNORMAL LOW (ref 6.5–8.1)

## 2016-08-07 LAB — BASIC METABOLIC PANEL
ANION GAP: 12 (ref 5–15)
BUN: 40 mg/dL — ABNORMAL HIGH (ref 6–20)
CO2: 22 mmol/L (ref 22–32)
Calcium: 8 mg/dL — ABNORMAL LOW (ref 8.9–10.3)
Chloride: 84 mmol/L — ABNORMAL LOW (ref 101–111)
Creatinine, Ser: 2.68 mg/dL — ABNORMAL HIGH (ref 0.61–1.24)
GFR calc non Af Amer: 24 mL/min — ABNORMAL LOW (ref >60)
GFR, EST AFRICAN AMERICAN: 28 mL/min — AB (ref >60)
Glucose, Bld: 120 mg/dL — ABNORMAL HIGH (ref 65–99)
POTASSIUM: 4.4 mmol/L (ref 3.5–5.1)
Sodium: 118 mmol/L — CL (ref 135–145)

## 2016-08-07 LAB — HEMOGLOBIN A1C
Hgb A1c MFr Bld: 9.6 % — ABNORMAL HIGH (ref 4.8–5.6)
Mean Plasma Glucose: 229 mg/dL

## 2016-08-07 MED ORDER — DEXTROSE-NACL 5-0.9 % IV SOLN
INTRAVENOUS | Status: AC
  Administered 2016-08-07: 18:00:00 via INTRAVENOUS

## 2016-08-07 MED ORDER — SODIUM POLYSTYRENE SULFONATE 15 GM/60ML PO SUSP
30.0000 g | Freq: Once | ORAL | Status: AC
  Administered 2016-08-07: 30 g via ORAL
  Filled 2016-08-07: qty 120

## 2016-08-07 MED ORDER — SODIUM CHLORIDE 0.9 % IV SOLN
INTRAVENOUS | Status: AC
  Administered 2016-08-07: 08:00:00 via INTRAVENOUS

## 2016-08-07 NOTE — Progress Notes (Signed)
Received critical sodium 119, which is the same as this morning.  Dr. Selena Batten is aware.

## 2016-08-07 NOTE — Progress Notes (Signed)
Hypoglycemic Event  CBG: 48  Treatment: 15 GM carbohydrate snack  Symptoms: Sweaty  Follow-up CBG: [id number] CBG Result:44  Possible Reasons for Event: Unknown  Comments/MD notified:    Bartholomew Boards

## 2016-08-07 NOTE — Progress Notes (Signed)
Hypoglycemic Event  CBG: 44  Treatment: 15 GM carbohydrate snack  Symptoms: Sweaty  Follow-up CBG: Time:1725 CBG Result:68  Possible Reasons for Event: Unknown  Comments/MD notified:    Bartholomew Boards

## 2016-08-07 NOTE — Progress Notes (Signed)
*  PRELIMINARY RESULTS* Echocardiogram 2D Echocardiogram has been performed.  Stacey Drain 08/07/2016, 3:08 PM

## 2016-08-07 NOTE — Progress Notes (Signed)
Hypoglycemic Event  CBG: 68  Treatment: 15 GM carbohydrate snack  Symptoms: Sweaty  Follow-up CBG: Time:1746 CBG Result:86  Possible Reasons for Event: Unknown  Comments/MD notified:    Bartholomew Boards

## 2016-08-07 NOTE — Progress Notes (Signed)
Patient ID: [name], male   DOB: [birth year], 61 y.o.   MRN: [id number]                                                                PROGRESS NOTE                                                                                                                                                                                                             Patient Demographics:    [name], is a 61 y.o. male, DOB - Oct 23, 1955, ONG:[id number]  Admit date - 08/05/2016   Admitting Physician Briscoe Deutscher, MD  Outpatient Primary MD for the patient is [facility]  LOS - 2  Outpatient Specialists:    Chief Complaint  Patient presents with  . Shortness of Breath       Brief Narrative  61 y.o.malewith medical history significant forchronic combined systolic/diastolic CHF, nonobstructive CAD, depression with anxiety, hypertension, and insulin-dependent diabetes mellitus presents the emergency department with 2-3 days of progressive exertional dyspnea and orthopnea. Patient reports that he had been in his usual state until 2 or 3 days ago when he noted increasing dyspnea with exertion. He also noted worsening orthopnea at that time and all of this has steadily worsened since onset. He reports sleeping poorly last night due to dyspnea despite sleeping upright. He continues to be short of breath while at rest today. He denies any chest pain or palpitations and denies any significant swelling or tenderness in the lower extremities. He reports a dry cough. He denies fevers or chills. He reports continued daily alcohol use, drinking two 40 oz bottles of malt liquor daily. He reports ongoing cocaine abuse, both insufflated and smoked, with last use yesterday.  ED Course:Upon arrival to the ED, patient is found to beafebrile, saturating adequately on room air, mildly tachypneic, and with vitals otherwise stable. EKG features a sinus rhythm with nonspecific interventricular conduction  delay. Chest x-ray is notable for chronic bilateral effusions and chronic bilateral lower lobe airspace opacities. Effusions appear slightly larger than the study from December 2017. Chemistry panel features a sodium of 118, potassium 5.4, and glucose of 219. CBC features a mild normocytic anemia with hemoglobin of 12.6. Troponin is within the normal limits and BNP is elevated to  a value of 2465. Patient remained tachypneic and dyspneic while at rest in the ED, but hemodynamically stable. He will be admitted to the telemetry unit for ongoing evaluation and management of dyspnea and orthopnea secondary to acute on chronic combined systolic/diastolic CHF, complicated by electrolyte derangements.    Subjective:    [name] today states that breathing is better.  Doing well. axox3.  Denies cp, palp, orthopnea, pnd.  Pt has ARF, secondary to diuresis.     Assessment  & Plan :    Principal Problem:   Acute on chronic combined systolic and diastolic CHF (congestive heart failure) (HCC) Active Problems:   Hypertension   History of substance abuse   Non Obstructive CAD (coronary artery disease)   Hyperkalemia   Hyponatremia   Polysubstance abuse   Type 2 diabetes mellitus with hyperglycemia, with long-term current use of insulin (HCC)   Depression with anxiety   1. Acute on chronic combined CHF ( TTE (04/11/16) with EF 20-25%, grade 2 diastolic dysfunction, severe diffuse HK, regional AK, mild MR, and mild LAE) Holding lasix due to renal insufficiency Strict I and O, check daily weight.   2. Hyponatremia  Serum sodium is 118 on admission , improving this am.   3. Hyperkalemia Kayexalate Hold lisinopril and aldactone Check bmp later today  4. ARF Hold lasix today,  Renal ultrasound=> no hydro Hydrate gently.  Check bmp later  5. Cocaine and alcohol dependence  Pt reports ongoing daily alcohol use of roughly 8 drinks/day, and frequent cocaine use  Counseled toward cessation    Monitor with CIWA and prn Ativan   6. Insulin-dependent DM  A1c was 12.0% in December 2017  Managed at home with metformin and Lantus 40 units qAM  Fsbs ac and qhs, ISS Continue Lantus 40 units qAM with moderate-intensity sliding-scale correctional   7. Depression, anxiety  Continue Zoloft qD, Ativan according to CIWA scoring   8. Hypertension  BP at goal  Managed at home with Coreg and lisinopril at home Coreg held given ongoing cocaine use and lisinopril held given the hyperkalemia    DVT prophylaxis:sq Lovenox  Code Status:Full  Family Communication:Discussed with patient Disposition Plan:Admit to telemetry Consults called:None Admission status:Inpatient      Lab Results  Component Value Date   PLT 204 08/07/2016    Antibiotics  :   Anti-infectives    None        Objective:   Vitals:   08/06/16 1600 08/06/16 2130 08/07/16 0022 08/07/16 0503  BP: 113/75 123/77 121/81 112/80  Pulse: 94 60 (!) 59 (!) 58  Resp: 20 20 20 20   Temp:  97.4 F (36.3 C) 97.4 F (36.3 C) 97.4 F (36.3 C)  TempSrc:  Oral Oral Oral  SpO2: 100% 95% 100% 100%  Weight:    78.2 kg (172 lb 6.4 oz)  Height:        Wt Readings from Last 3 Encounters:  08/07/16 78.2 kg (172 lb 6.4 oz)  04/14/16 71.8 kg (158 lb 3.2 oz)  02/16/16 75.9 kg (167 lb 5.3 oz)     Intake/Output Summary (Last 24 hours) at 08/07/16 1452 Last data filed at 08/07/16 0600  Gross per 24 hour  Intake              240 ml  Output                0 ml  Net  240 ml     Physical Exam  Awake Alert, Oriented X 3, No new F.N deficits, Normal affect [place].AT,PERRAL, mm dry Supple Neck,No JVD, No cervical lymphadenopathy appriciated.  Symmetrical Chest wall movement, Good air movement bilaterally, CTAB RRR,No Gallops,Rubs or new Murmurs, No Parasternal Heave +ve B.Sounds, Abd Soft, No tenderness, No organomegaly appriciated, No rebound - guarding or rigidity. No Cyanosis, Clubbing or edema,  No new Rash or bruise     Data Review:    CBC  Recent Labs Lab 08/05/16 1921 08/06/16 0103 08/07/16 0555 08/07/16 0918  WBC 5.3 5.6 7.5 7.1  HGB 12.6* 12.6* 13.1 12.9*  HCT 35.5* 35.0* 36.5* 35.6*  PLT 213 199 206 204  MCV 82.9 82.9 82.4 82.2  MCH 29.4 29.9 29.6 29.8  MCHC 35.5 36.0 35.9 36.2*  RDW 11.9 11.9 11.9 12.2  LYMPHSABS 1.7 1.5  --   --   MONOABS 0.5 0.4  --   --   EOSABS 0.0 0.0  --   --   BASOSABS 0.0 0.0  --   --     Chemistries   Recent Labs Lab 08/05/16 1921 08/06/16 0103 08/06/16 1439 08/07/16 0555 08/07/16 0918  NA 118* 118* 117* 119* 119*  K 5.4* 4.6 5.2* 5.5* 5.3*  CL 86* 86* 86* 87* 83*  CO2 20* 20* 18* 20* 21*  GLUCOSE 219* 195* 71 74 103*  BUN 14 17 23* 32* 32*  CREATININE 1.11 1.24 1.99* 2.57* 2.72*  CALCIUM 8.2* 8.2* 8.1* 8.3* 8.4*  AST  --   --   --  217* 254*  ALT  --   --   --  97* 110*  ALKPHOS  --   --   --  102 101  BILITOT  --   --   --  1.9* 1.8*   ------------------------------------------------------------------------------------------------------------------ No results for input(s): CHOL, HDL, LDLCALC, TRIG, CHOLHDL, LDLDIRECT in the last 72 hours.  Lab Results  Component Value Date   HGBA1C 9.6 (H) 08/05/2016   ------------------------------------------------------------------------------------------------------------------ No results for input(s): TSH, T4TOTAL, T3FREE, THYROIDAB in the last 72 hours.  Invalid input(s): FREET3 ------------------------------------------------------------------------------------------------------------------ No results for input(s): VITAMINB12, FOLATE, FERRITIN, TIBC, IRON, RETICCTPCT in the last 72 hours.  Coagulation profile No results for input(s): INR, PROTIME in the last 168 hours.  No results for input(s): DDIMER in the last 72 hours.  Cardiac Enzymes No results for input(s): CKMB, TROPONINI, MYOGLOBIN in the last 168 hours.  Invalid input(s):  CK ------------------------------------------------------------------------------------------------------------------    Component Value Date/Time   BNP 2,465.1 (H) 08/05/2016 1922    Inpatient Medications  Scheduled Meds: . enoxaparin (LOVENOX) injection  40 mg Subcutaneous Daily  . folic acid  1 mg Oral Daily  . insulin aspart  0-15 Units Subcutaneous TID WC  . insulin aspart  0-5 Units Subcutaneous QHS  . insulin glargine  40 Units Subcutaneous Daily  . LORazepam  0-4 mg Intravenous Q6H   Followed by  . [START ON 08/09/2016] LORazepam  0-4 mg Intravenous Q12H  . multivitamin with minerals  1 tablet Oral Daily  . pravastatin  10 mg Oral Daily  . sertraline  100 mg Oral QHS  . sodium chloride flush  3 mL Intravenous Q12H  . thiamine  100 mg Oral Daily  . traZODone  150 mg Oral QHS   Continuous Infusions: . sodium chloride 50 mL/hr at 08/07/16 0809   PRN Meds:.sodium chloride, acetaminophen, LORazepam **OR** LORazepam, ondansetron (ZOFRAN) IV, sodium chloride flush  Micro Results No results found for  this or any previous visit (from the past 240 hour(s)).  Radiology Reports Dg Chest 2 View  Result Date: 08/05/2016 CLINICAL DATA:  Drowsiness/lethargy, shortness of breath. EXAM: CHEST  2 VIEW COMPARISON:  04/12/2016 FINDINGS: Moderate bilateral pleural effusions with bilateral lower lobe airspace opacities, some worsening compared to 04/12/16. Pneumonia cannot be excluded. Faint interstitial accentuation. Prominent retrosternal clear space compatible with emphysema. IMPRESSION: 1. Chronic bilateral pleural effusions and chronic bilateral lower lobe airspace opacities. Pleural effusions seems slightly larger than on 04/12/2016. Electronically Signed   By: Gaylyn Rong M.D.   On: 08/05/2016 20:21   US Renal  Result Date: 08/07/2016 CLINICAL DATA:  Acute renal failure. EXAM: RENAL / URINARY TRACT ULTRASOUND COMPLETE COMPARISON:  Two-view chest x-ray 08/05/2016 FINDINGS: Right  Kidney: Length: 13.0 cm, within normal limits. Echogenicity within normal limits. No mass or hydronephrosis visualized. Left Kidney: Length: 12.9 cm, within normal limits. Echogenicity within normal limits. There is mild dilation of the renal collecting system without significant hydronephrosis. Bladder: Appears normal for degree of bladder distention. Moderate bilateral pleural effusions are noted, right greater left. A small amount of abdominal ascites is present as well. IMPRESSION: 1. Mild dilation of the left renal collecting system without significant hydronephrosis. 2. The right kidney is normal. 3. Moderate bilateral pleural effusions, right greater than left. 4. Small volume ascites. Electronically Signed   By: Marin Roberts M.D.   On: 08/07/2016 11:16    Time Spent in minutes  30   Pearson Grippe M.D on 08/07/2016 at 2:52 PM  Between 7am to 7pm - Pager - [phone]  After 7pm go to [email or web address] - password [facility]  [facility] -  Office  [phone]

## 2016-08-07 NOTE — Progress Notes (Signed)
Night nurse, Sharin Mons, RN received critical from lab that patient's sodium is 119.  Dr. Selena Batten called and made aware.

## 2016-08-08 ENCOUNTER — Other Ambulatory Visit (HOSPITAL_COMMUNITY): Payer: Self-pay

## 2016-08-08 ENCOUNTER — Inpatient Hospital Stay (HOSPITAL_COMMUNITY): Payer: Medicare Other

## 2016-08-08 DIAGNOSIS — Z87898 Personal history of other specified conditions: Secondary | ICD-10-CM

## 2016-08-08 DIAGNOSIS — N170 Acute kidney failure with tubular necrosis: Secondary | ICD-10-CM

## 2016-08-08 LAB — COMPREHENSIVE METABOLIC PANEL
ALK PHOS: 109 U/L (ref 38–126)
ALT: 209 U/L — AB (ref 17–63)
AST: 482 U/L — AB (ref 15–41)
Albumin: 2.7 g/dL — ABNORMAL LOW (ref 3.5–5.0)
Anion gap: 12 (ref 5–15)
BILIRUBIN TOTAL: 1.1 mg/dL (ref 0.3–1.2)
BUN: 36 mg/dL — AB (ref 6–20)
CALCIUM: 8.3 mg/dL — AB (ref 8.9–10.3)
CO2: 20 mmol/L — ABNORMAL LOW (ref 22–32)
CREATININE: 2.33 mg/dL — AB (ref 0.61–1.24)
Chloride: 88 mmol/L — ABNORMAL LOW (ref 101–111)
GFR, EST AFRICAN AMERICAN: 33 mL/min — AB (ref >60)
GFR, EST NON AFRICAN AMERICAN: 28 mL/min — AB (ref >60)
Glucose, Bld: 67 mg/dL (ref 65–99)
Potassium: 4.3 mmol/L (ref 3.5–5.1)
Sodium: 120 mmol/L — ABNORMAL LOW (ref 135–145)
Total Protein: 6.3 g/dL — ABNORMAL LOW (ref 6.5–8.1)

## 2016-08-08 LAB — GLUCOSE, CAPILLARY
GLUCOSE-CAPILLARY: 54 mg/dL — AB (ref 65–99)
GLUCOSE-CAPILLARY: 111 mg/dL — AB (ref 65–99)
GLUCOSE-CAPILLARY: 54 mg/dL — AB (ref 65–99)
Glucose-Capillary: 93 mg/dL (ref 65–99)
Glucose-Capillary: 133 mg/dL — ABNORMAL HIGH (ref 65–99)
Glucose-Capillary: 109 mg/dL — ABNORMAL HIGH (ref 65–99)

## 2016-08-08 LAB — CBC
HCT: 36.4 % — ABNORMAL LOW (ref 39.0–52.0)
HEMOGLOBIN: 13.1 g/dL (ref 13.0–17.0)
MCH: 29.8 pg (ref 26.0–34.0)
MCHC: 36 g/dL (ref 30.0–36.0)
MCV: 82.9 fL (ref 78.0–100.0)
PLATELETS: 189 10*3/uL (ref 150–400)
RBC: 4.39 MIL/uL (ref 4.22–5.81)
RDW: 12.2 % (ref 11.5–15.5)
WBC: 6.6 10*3/uL (ref 4.0–10.5)

## 2016-08-08 LAB — COOXEMETRY PANEL
CARBOXYHEMOGLOBIN: 0.9 % (ref 0.5–1.5)
Methemoglobin: 1 % (ref 0.0–1.5)
O2 SAT: 39.8 %
TOTAL HEMOGLOBIN: 12.3 g/dL (ref 12.0–16.0)

## 2016-08-08 LAB — AMMONIA: AMMONIA: 25 umol/L (ref 9–35)

## 2016-08-08 MED ORDER — FUROSEMIDE 10 MG/ML IJ SOLN: 40.0000 mg | Freq: Once | INTRAMUSCULAR | Status: DC

## 2016-08-08 MED ORDER — SODIUM CHLORIDE 0.9% FLUSH: 10.0000 mL | INTRAVENOUS | Status: DC | PRN

## 2016-08-08 MED ORDER — DEXTROSE 50 % IV SOLN
INTRAVENOUS | Status: AC
  Administered 2016-08-08: 17:00:00
  Filled 2016-08-08: qty 50

## 2016-08-08 MED ORDER — SODIUM CHLORIDE 0.9% FLUSH
10.0000 mL | Freq: Two times a day (BID) | INTRAVENOUS | Status: DC
  Administered 2016-08-08: 10 mL
  Administered 2016-08-09: 20 mL
  Administered 2016-08-09 – 2016-08-12 (×6): 10 mL

## 2016-08-08 MED ORDER — INSULIN GLARGINE 100 UNIT/ML ~~LOC~~ SOLN
20.0000 [IU] | Freq: Every day | SUBCUTANEOUS | Status: DC
  Filled 2016-08-08: qty 0.2

## 2016-08-08 MED ORDER — INSULIN ASPART 100 UNIT/ML ~~LOC~~ SOLN
0.0000 [IU] | Freq: Three times a day (TID) | SUBCUTANEOUS | Status: DC
  Administered 2016-08-09 (×2): 2 [IU] via SUBCUTANEOUS
  Administered 2016-08-10 – 2016-08-11 (×2): 1 [IU] via SUBCUTANEOUS
  Administered 2016-08-11: 3 [IU] via SUBCUTANEOUS
  Administered 2016-08-12: 2 [IU] via SUBCUTANEOUS

## 2016-08-08 MED ORDER — INSULIN GLARGINE 100 UNIT/ML ~~LOC~~ SOLN
15.0000 [IU] | Freq: Every day | SUBCUTANEOUS | Status: DC
  Filled 2016-08-08 (×2): qty 0.15

## 2016-08-08 MED ORDER — MILRINONE LACTATE IN DEXTROSE 20-5 MG/100ML-% IV SOLN
0.1250 ug/kg/min | INTRAVENOUS | Status: DC
  Administered 2016-08-08 – 2016-08-11 (×5): 0.25 ug/kg/min via INTRAVENOUS
  Administered 2016-08-12: 0.125 ug/kg/min via INTRAVENOUS
  Filled 2016-08-08 (×6): qty 100

## 2016-08-08 NOTE — Progress Notes (Signed)
Report given to Inetta Fermo, RN on 3W.  Patient did not want me to call his sisters as they would worry.  Patient had medications brought to pharmacy to be stored.

## 2016-08-08 NOTE — Progress Notes (Signed)
   PICC line placed.   CO-OX 40%. Cardiogenic Shock with evidence of multisystem organ failure.   Start milrinone 0.25 mcg now. Repeat CO-OX in am. May need to increase to 0.375 mcg.   Plan to check CVPs.   Plan to use inotropes short term. He is not a candidate for advanced therapies/home inotropes given ongoing substance abuse.    Amy Clegg NP-C  4:24 PM

## 2016-08-08 NOTE — Progress Notes (Signed)
Hypoglycemic Event  CBG: 54  Treatment: 2 cup orange juice  Symptoms: none  Follow-up CBG: Time:111 CBG Result:0647  Possible Reasons for Event: unknown  Comments/MD notified: K.  Schorr NP    Venita Sheffield

## 2016-08-08 NOTE — Progress Notes (Signed)
Peripherally Inserted Central Catheter/Midline Placement  The IV Nurse has discussed with the patient and/or persons authorized to consent for the patient, the purpose of this procedure and the potential benefits and risks involved with this procedure.  The benefits include less needle sticks, lab draws from the catheter, and the patient may be discharged home with the catheter. Risks include, but not limited to, infection, bleeding, blood clot (thrombus formation), and puncture of an artery; nerve damage and irregular heartbeat and possibility to perform a PICC exchange if needed/ordered by physician.  Alternatives to this procedure were also discussed.  Bard Power PICC patient education guide, fact sheet on infection prevention and patient information card has been provided to patient /or left at bedside.    PICC/Midline Placement Documentation  PICC Double Lumen 08/08/16 PICC Right Basilic 43 cm 0 cm (Active)  Indication for Insertion or Continuance of Line Vasoactive infusions 08/08/2016  3:43 PM  Exposed Catheter (cm) 0 cm 08/08/2016  3:43 PM  Site Assessment Clean;Dry;Intact 08/08/2016  3:43 PM  Lumen #1 Status Flushed;Saline locked;Blood return noted 08/08/2016  3:43 PM  Lumen #2 Status Flushed;Saline locked;Blood return noted 08/08/2016  3:43 PM  Dressing Type Transparent;Securing device 08/08/2016  3:43 PM  Dressing Status Clean;Dry;Intact;Antimicrobial disc in place 08/08/2016  3:43 PM  Dressing Change Due 08/15/16 08/08/2016  3:43 PM       [name] 08/08/2016, 3:47 PM

## 2016-08-08 NOTE — Consult Note (Addendum)
Advanced Heart Failure Team Consult Note   Primary Physician: [facility]  Primary Cardiologist:  Dr Aundra Dubin   Reason for Consultation: Heart Failure   HPI:    [name] is seen today for evaluation of heart failure at the request of Dr Maudie Mercury.    Mr Mahler is a 61 year old with a history of NICM, combined systolic/diastolic heart failure, HTN, DIM, CAD, ETOH abuse, and cocaine abuse. LHC 08/13/2012 LAC 50% stenosis, Circumflex 40%, RCA proximal stenosis.   Prior to admit he had been out of HF meds for 3 months. Drinks at least three 40 ounce beers per day. Uses cocaine any chance he can get it. Smokes 1/2 PPD. [facility]. SOB with exertion.   Admitted with increased dyspnea.  CXR on admit showed bilteral pleural effusions with Left >R. Pertinent admission labs included:  Sodium 118, K 5.4, BNP 2465, creatinine 1.11, and troponin negative. He was started IV lasix. Creatinine and LFTs rising.   Renal US 08/07/16. No evidence of hydronephrosis.   ECHO 08/07/2016 EF 10-15%. Peak PA pressure 69 mm hg, RV mildly dilated. Trivial pericardial effusion. L pleural effusion.   Lab Results  Component Value Date   CREATININE 2.33 (H) 08/08/2016   CREATININE 2.68 (H) 08/07/2016   CREATININE 2.72 (H) 08/07/2016   Hepatic Function Latest Ref Rng & Units 08/08/2016 08/07/2016 08/07/2016  Total Protein 6.5 - 8.1 g/dL 6.3(L) 6.2(L) 6.2(L)  Albumin 3.5 - 5.0 g/dL 2.7(L) 2.8(L) 2.6(L)  AST 15 - 41 U/L 482(H) 254(H) 217(H)  ALT 17 - 63 U/L 209(H) 110(H) 97(H)  Alk Phosphatase 38 - 126 U/L 109 101 102  Total Bilirubin 0.3 - 1.2 mg/dL 1.1 1.8(H) 1.9(H)  Bilirubin, Direct 0.1 - 0.5 mg/dL - - -      Review of Systems: [y] = yes, [ ]  = no   General: Weight gain [ ] ; Weight loss [ ] ; Anorexia [ ] ; Fatigue [ Y]; Fever [ ] ; Chills [ ] ; Weakness [Y ]  Cardiac: Chest pain/pressure [ ] ; Resting SOB [ ] ; Exertional SOB [ Y]; Orthopnea [ ] ; Pedal Edema [ ] ; Palpitations [  ]; Syncope [ ] ; Presyncope [ ] ; Paroxysmal nocturnal dyspnea[ ]   Pulmonary: Cough [ ] ; Wheezing[ ] ; Hemoptysis[ ] ; Sputum [ ] ; Snoring [ ]   GI: Vomiting[ ] ; Dysphagia[ ] ; Melena[ ] ; Hematochezia [ ] ; Heartburn[ ] ; Abdominal pain [ ] ; Constipation [ ] ; Diarrhea [ ] ; BRBPR [ ]   GU: Hematuria[ ] ; Dysuria [ ] ; Nocturia[ ]   Vascular: Pain in legs with walking [ ] ; Pain in feet with lying flat [ ] ; Non-healing sores [ ] ; Stroke [ ] ; TIA [ ] ; Slurred speech [ ] ;  Neuro: Headaches[ ] ; Vertigo[ ] ; Seizures[ ] ; Paresthesias[ ] ;Blurred vision [ ] ; Diplopia [ ] ; Vision changes [ ]   Ortho/Skin: Arthritis [ ] ; Joint pain [Y ]; Muscle pain [ ] ; Joint swelling [ ] ; Back Pain [ ] ; Rash [ ]   Psych: Depression[ ] ; Anxiety[ ]   Heme: Bleeding problems [ ] ; Clotting disorders [ ] ; Anemia [ ]   Endocrine: Diabetes [Y ]; Thyroid dysfunction[ ]   Home Medications Prior to Admission medications   Medication Sig Start Date End Date Taking? Authorizing Provider  acetaminophen (TYLENOL) 325 MG tablet Take 650 mg by mouth every 6 (six) hours as needed for mild pain.   Yes Historical Provider, MD  ARIPiprazole (ABILIFY) 10 MG tablet Take 5 mg by mouth daily.   Yes Historical Provider,  MD  aspirin 325 MG tablet Take 325 mg by mouth every 6 (six) hours as needed.   Yes Historical Provider, MD  aspirin 81 MG chewable tablet Chew 81 mg by mouth daily.   Yes Historical Provider, MD  atorvastatin (LIPITOR) 20 MG tablet Take 20 mg by mouth daily at 6 PM.   Yes Historical Provider, MD  buPROPion (WELLBUTRIN) 100 MG tablet Take 100 mg by mouth at bedtime.   Yes Historical Provider, MD  carvedilol (COREG) 3.125 MG tablet Take 1 tablet (3.125 mg total) by mouth 2 (two) times daily with a meal. Patient taking differently: Take 12.5 mg by mouth 2 (two) times daily with a meal.  04/14/16  Yes Maryann Mikhail, DO  furosemide (LASIX) 40 MG tablet Take 1 tablet (40 mg total) by mouth 2 (two) times daily. Patient taking differently: Take 40 mg  by mouth daily.  04/14/16  Yes Maryann Mikhail, DO  metFORMIN (GLUCOPHAGE) 1000 MG tablet Take 0.5 tablets (500 mg total) by mouth 2 (two) times daily with a meal. For high blood sugar control Patient taking differently: Take 1,000 mg by mouth 2 (two) times daily with a meal. For high blood sugar control 12/27/12  Yes Encarnacion Slates, NP  metoprolol (TOPROL-XL) 200 MG 24 hr tablet Take 100 mg by mouth at bedtime.   Yes Historical Provider, MD  Multiple Vitamin (MULTIVITAMIN WITH MINERALS) TABS tablet Take 1 tablet by mouth daily. 02/27/14  Yes Christina P Rama, MD  potassium chloride (K-DUR,KLOR-CON) 10 MEQ tablet Take 10 mEq by mouth daily.   Yes Historical Provider, MD  pravastatin (PRAVACHOL) 10 MG tablet Take 1 tablet (10 mg total) by mouth daily. For high cholesterol control 09/16/15  Yes Kelvin Cellar, MD  sertraline (ZOLOFT) 100 MG tablet Take 100 mg by mouth at bedtime.    Yes Historical Provider, MD  spironolactone (ALDACTONE) 25 MG tablet Take 25 mg by mouth daily.   Yes Historical Provider, MD  traZODone (DESYREL) 100 MG tablet Take 1 tablet (100 mg total) by mouth at bedtime. For sleep Patient taking differently: Take 150 mg by mouth at bedtime. For sleep 12/27/12  Yes Encarnacion Slates, NP  feeding supplement, GLUCERNA SHAKE, (GLUCERNA SHAKE) LIQD Take 237 mLs by mouth 3 (three) times daily between meals. Patient not taking: Reported on 04/14/2016 02/27/14   Venetia Maxon Rama, MD  folic acid (FOLVITE) 1 MG tablet Take 1 tablet (1 mg total) by mouth daily. Patient not taking: Reported on 04/14/2016 02/27/14   Venetia Maxon Rama, MD  guaiFENesin (MUCINEX) 600 MG 12 hr tablet Take 1 tablet (600 mg total) by mouth 2 (two) times daily. Patient not taking: Reported on 04/14/2016 09/15/15   Florencia Reasons, MD  Insulin Glargine (LANTUS SOLOSTAR) 100 UNIT/ML Solostar Pen Inject 20 Units into the skin every morning. Patient taking differently: Inject 40 Units into the skin every morning.  09/16/15   Kelvin Cellar, MD   lisinopril (PRINIVIL,ZESTRIL) 10 MG tablet Take 1 tablet (10 mg total) by mouth daily. 04/15/16   Maryann Mikhail, DO  magnesium oxide (MAG-OX) 400 (241.3 Mg) MG tablet Take 1 tablet (400 mg total) by mouth daily. Patient not taking: Reported on 04/14/2016 09/15/15   Florencia Reasons, MD  thiamine 100 MG tablet Take 1 tablet (100 mg total) by mouth daily. Patient not taking: Reported on 04/14/2016 02/27/14   Venetia Maxon Rama, MD    Past Medical History: Past Medical History:  Diagnosis Date  . CHF (congestive heart failure) ([place])   .  Coronary artery disease   . Depression   . Diabetes mellitus   . Hypertension   . Mental disorder   . MI (myocardial infarction) ([place]) 35   age 54 per patient    Past Surgical History: Past Surgical History:  Procedure Laterality Date  . ANGIOPLASTY  1984   at age 70  . LEFT AND RIGHT HEART CATHETERIZATION WITH CORONARY/GRAFT ANGIOGRAM  08/13/2012   Procedure: LEFT AND RIGHT HEART CATHETERIZATION WITH [name];  Surgeon: Burnell Blanks, MD;  Location: [facility] CATH LAB;  Service: Cardiovascular;;    Family History: Family History  Problem Relation Age of Onset  . Heart attack Father     >60s  . Hypertension Father   . Diabetes Father   . Heart attack Sister   . Hypertension Mother     Social History: Social History   Social History  . Marital status: Widowed    Spouse name: N/A  . Number of children: N/A  . Years of education: N/A   Social History Main Topics  . Smoking status: Current Every Day Smoker    Packs/day: 0.50    Types: Cigarettes  . Smokeless tobacco: Never Used  . Alcohol use No     Comment: sts he hasn't drunk in 6 months   . Drug use: Yes    Frequency: 7.0 times per week    Types: Cocaine, Marijuana     Comment: last use about 6 months ago  . Sexual activity: Yes   Other Topics Concern  . None   Social History Narrative  . None    Allergies:  No Known Allergies  Objective:    Vital  Signs:   Temp:  [97.4 F (36.3 C)-97.9 F (36.6 C)] 97.4 F (36.3 C) (04/16 0604) Pulse Rate:  [65-77] 71 (04/16 0604) Resp:  [18-20] 18 (04/16 0604) BP: (114-144)/(73-95) 114/73 (04/16 0604) SpO2:  [97 %-100 %] 97 % (04/16 0604) Weight:  [173 lb 8 oz (78.7 kg)] 173 lb 8 oz (78.7 kg) (04/16 0604) Last BM Date: 08/04/16  Weight change: Filed Weights   08/06/16 0413 08/07/16 0503 08/08/16 0604  Weight: 173 lb 4.8 oz (78.6 kg) 172 lb 6.4 oz (78.2 kg) 173 lb 8 oz (78.7 kg)    Intake/Output:   Intake/Output Summary (Last 24 hours) at 08/08/16 0804 Last data filed at 08/08/16 0643  Gross per 24 hour  Intake             1430 ml  Output              400 ml  Net             1030 ml     Physical Exam: General:  Chronically ill appearing. NAD. In bed.  HEENT: normal Neck: supple. JVP 10. Carotids 2+ bilat; no bruits. No lymphadenopathy or thyromegaly appreciated. Cor: PMI nondisplaced. Regular rate & rhythm. Distant heart sounds. + S3 No rubs, or murmurs. Lungs: Decreased in the bases.  Abdomen: soft, nontender, nondistended. No hepatosplenomegaly. No bruits or masses. Good bowel sounds. Extremities: no cyanosis, clubbing, rash, R and LLE cool. No edema Neuro: alert & orientedx3, cranial nerves grossly intact. moves all 4 extremities w/o difficulty. Affect pleasant .    Telemetry: NSR 80s. Personally reviewed.   Labs: Basic Metabolic Panel:  Recent Labs Lab 08/06/16 1439 08/07/16 0555 08/07/16 0918 08/07/16 1833 08/08/16 0332  NA 117* 119* 119* 118* 120*  K 5.2* 5.5* 5.3* 4.4 4.3  CL 86* 87* 83*  84* 88*  CO2 18* 20* 21* 22 20*  GLUCOSE 71 74 103* 120* 67  BUN 23* 32* 32* 40* 36*  CREATININE 1.99* 2.57* 2.72* 2.68* 2.33*  CALCIUM 8.1* 8.3* 8.4* 8.0* 8.3*    Liver Function Tests:  Recent Labs Lab 08/07/16 0555 08/07/16 0918 08/08/16 0332  AST 217* 254* 482*  ALT 97* 110* 209*  ALKPHOS 102 101 109  BILITOT 1.9* 1.8* 1.1  PROT 6.2* 6.2* 6.3*  ALBUMIN 2.6*  2.8* 2.7*   No results for input(s): LIPASE, AMYLASE in the last 168 hours. No results for input(s): AMMONIA in the last 168 hours.  CBC:  Recent Labs Lab 08/05/16 1921 08/06/16 0103 08/07/16 0555 08/07/16 0918 08/08/16 0332  WBC 5.3 5.6 7.5 7.1 6.6  NEUTROABS 3.1 3.8  --   --   --   HGB 12.6* 12.6* 13.1 12.9* 13.1  HCT 35.5* 35.0* 36.5* 35.6* 36.4*  MCV 82.9 82.9 82.4 82.2 82.9  PLT 213 199 206 204 189    Cardiac Enzymes: No results for input(s): CKTOTAL, CKMB, CKMBINDEX, TROPONINI in the last 168 hours.  BNP: BNP (last 3 results)  Recent Labs  09/14/15 0518 04/10/16 2203 08/05/16 1922  BNP 862.7* 1,828.2* 2,465.1*    ProBNP (last 3 results) No results for input(s): PROBNP in the last 8760 hours.   CBG:  Recent Labs Lab 08/07/16 2131 08/07/16 2248 08/07/16 2338 08/08/16 0555 08/08/16 0647  GLUCAP 155* 169* 156* 54* 111*    Coagulation Studies: No results for input(s): LABPROT, INR in the last 72 hours.  Other results: EKG: NSR 62 bpm. On admit .   Imaging: US Renal  Result Date: 08/07/2016 CLINICAL DATA:  Acute renal failure. EXAM: RENAL / URINARY TRACT ULTRASOUND COMPLETE COMPARISON:  Two-view chest x-ray 08/05/2016 FINDINGS: Right Kidney: Length: 13.0 cm, within normal limits. Echogenicity within normal limits. No mass or hydronephrosis visualized. Left Kidney: Length: 12.9 cm, within normal limits. Echogenicity within normal limits. There is mild dilation of the renal collecting system without significant hydronephrosis. Bladder: Appears normal for degree of bladder distention. Moderate bilateral pleural effusions are noted, right greater left. A small amount of abdominal ascites is present as well. IMPRESSION: 1. Mild dilation of the left renal collecting system without significant hydronephrosis. 2. The right kidney is normal. 3. Moderate bilateral pleural effusions, right greater than left. 4. Small volume ascites. Electronically Signed   By:  San Morelle M.D.   On: 08/07/2016 11:16      Medications:     Current Medications: . enoxaparin (LOVENOX) injection  40 mg Subcutaneous Daily  . folic acid  1 mg Oral Daily  . insulin aspart  0-9 Units Subcutaneous TID WC  . insulin glargine  15 Units Subcutaneous Daily  . LORazepam  0-4 mg Intravenous Q6H   Followed by  . [START ON 08/09/2016] LORazepam  0-4 mg Intravenous Q12H  . multivitamin with minerals  1 tablet Oral Daily  . pravastatin  10 mg Oral Daily  . sertraline  100 mg Oral QHS  . sodium chloride flush  3 mL Intravenous Q12H  . thiamine  100 mg Oral Daily  . traZODone  150 mg Oral QHS     Infusions: . dextrose 5 % and 0.9% NaCl 50 mL/hr at 08/07/16 1759      Assessment/Plan   1. A/C Combined Systolic/Diastolic Heart Failure ECHO 08/07/2016 EF 10-15%. Peak PA pressure 69 mm hg RV mildly dilated.  No bb with suspected low output. Volume status does not  appear elevated.  No Arb, dig, spiro with AKI.  Place PICC for CO-OX . If low can add short term milrinone.  2. Hyponatremia Sodium on admit 118. Today's  sodium is 120 Restrict free water.  3. AKI- Creatinine on admit 1.1. Today's creatinine up to 2.33  Renal US on 4/15 without evidence of hydronephrosis 4. Elevated LFTs Suspect in the setting of cardiogenic shock. LFTs continue to rise. Check ammonia. US liver.  He is not on a statin.  5. CAD- non obstructive on LHC 2014. No statin with elevated LFTs.  6. ETOH abuse- Drinks multiple beers daily.  7. Substance Abuse- + Cocaine on admit.  8. Tobacco Abuse- Counseled on smoking cessation.   He is not candidate for advanced therapies or home inotropes with substance abuse.   Length of Stay: 3  Amy Clegg, NP  08/08/2016, 8:04 AM  Advanced Heart Failure Team Pager [phone] (M-F; 7a - 4p)  Please contact [facility] for night-coverage after hours (4p -7a ) and weekends on [email or web address]  Patient seen with NP, agree with the above note.  1.  Acute on chronic systolic CHF: Echo with EF 10-15%.  Cath 2014 with nonobstructive CAD.  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  Admitted with dyspnea, initially given Lasix but has had significant rise in creatinine and LFTs.  Concerning for low output state though BP has not been low.  Slow of speech and thought.  Some volume overload with JVP about 10 on my exam though difficult.   - Place PICC to follow CVP and co-ox (not going to be an HD candidate).  - Not candidate for advanced therapies with active substance abuse.  - If co-ox low (suspect it will be), will start milrinone 0.25.  If CVP elevated (suspect it will be), will try to diurese on milrinone.  Not candidate to go home on milrinone.  - No ARB/ACEI/spironolactone or digoxin with creatinine up.  - Will add Bidil eventually.  2. AKI: Concern for low output HF.  As above, placing PICC for co-ox, will try to maintain cardiac output.  3. ETOH abuse: Heavy drinker, advised to quit.  4. Cocaine: Active use, advised to quit.  5. Elevated LFTs: Concerning that this is due to low cardiac output.  Will order RUQ [place] as at risk for ETOH cirrhosis and will check NH3.  6. Hyponatremia: Marked. Suspect hypervolemic hyponatremia in setting of CHF.  For now, would fluid restrict (< 1200 cc/day fluid).  If CVP elevated, would give tolvaptan.   Loralie Champagne 08/08/2016 9:38 AM

## 2016-08-08 NOTE — Progress Notes (Signed)
Pharmacist Heart Failure Core Measure Documentation  Assessment: [name] has an EF documented as 10-15% on 08/07/16 by ECHO.  Rationale: Heart failure patients with left ventricular systolic dysfunction (LVSD) and an EF < 40% should be prescribed an angiotensin converting enzyme inhibitor (ACEI) or angiotensin receptor blocker (ARB) at discharge unless a contraindication is documented in the medical record.  This patient is not currently on an ACEI or ARB for HF.  This note is being placed in the record in order to provide documentation that a contraindication to the use of these agents is present for this encounter.  ACE Inhibitor or Angiotensin Receptor Blocker is contraindicated (specify all that apply)  []   ACEI allergy AND ARB allergy []   Angioedema []   Moderate or severe aortic stenosis []   Hyperkalemia []   Hypotension []   Renal artery stenosis [x]   Worsening renal function, preexisting renal disease or dysfunction.    Min Collymore D. [name], PharmD, BCPS Pager:  [phone] 08/08/2016, 11:21 AM

## 2016-08-08 NOTE — Progress Notes (Signed)
Patient ID: GIVEN CHIDESTER, male   DOB: July 19, 1955, 61 y.o.   MRN: [id number]                                                                PROGRESS NOTE                                                                                                                                                                                                             Patient Demographics:    [name], is a 61 y.o. male, DOB - [birth year], OBS:[id number]  Admit date - 08/05/2016   Admitting Physician Briscoe Deutscher, MD  Outpatient Primary MD for the patient is [facility]  LOS - 3  Outpatient Specialists:    Chief Complaint  Patient presents with  . Shortness of Breath       Brief Narrative  61 y.o.malewith medical history significant forchronic combined systolic/diastolic CHF, nonobstructive CAD, depression with anxiety, hypertension, and insulin-dependent diabetes mellitus presents the emergency department with 2-3 days of progressive exertional dyspnea and orthopnea. Patient reports that he had been in his usual state until 2 or 3 days ago when he noted increasing dyspnea with exertion. He also noted worsening orthopnea at that time and all of this has steadily worsened since onset. He reports sleeping poorly last night due to dyspnea despite sleeping upright. He continues to be short of breath while at rest today. He denies any chest pain or palpitations and denies any significant swelling or tenderness in the lower extremities. He reports a dry cough. He denies fevers or chills. He reports continued daily alcohol use, drinking two 40 oz bottles of malt liquor daily. He reports ongoing cocaine abuse, both insufflated and smoked, with last use yesterday.  ED Course:Upon arrival to the ED, patient is found to beafebrile, saturating adequately on room air, mildly tachypneic, and with vitals otherwise stable. EKG features a sinus rhythm with nonspecific interventricular conduction  delay. Chest x-ray is notable for chronic bilateral effusions and chronic bilateral lower lobe airspace opacities. Effusions appear slightly larger than the study from December 2017. Chemistry panel features a sodium of 118, potassium 5.4, and glucose of 219. CBC features a mild normocytic anemia with hemoglobin of 12.6. Troponin is within the normal limits and BNP is elevated to  a value of 2465. Patient remained tachypneic and dyspneic while at rest in the ED, but hemodynamically stable. He will be admitted to the telemetry unit for ongoing evaluation and management of dyspnea and orthopnea secondary to acute on chronic combined systolic/diastolic CHF, complicated by electrolyte derangements.    Subjective:    [name] today states that breathing is better.  He complains of not being able to sleep.      Assessment  & Plan :    Principal Problem:   Acute on chronic combined systolic and diastolic CHF (congestive heart failure) (HCC) Active Problems:   Hypertension   History of substance abuse   Non Obstructive CAD (coronary artery disease)   Hyperkalemia   Hyponatremia   Polysubstance abuse   Type 2 diabetes mellitus with hyperglycemia, with long-term current use of insulin (HCC)   Depression with anxiety   ARF (acute renal failure) (HCC)   1. Acute on chronic combined CHF ( grade 2 diastolic dysfunction, severe diffuse HK, regional AK, mild MR, and mild LAE) lasix had been held due to renal insufficiency  Strict I and O, check daily weight.  He has not diuresed very well since admission and weight seems up I have placed a consult to heart failure team for assistance with management ECHO 4/15 : Systolic function was severely reduced. The  estimated ejection fraction was in the range of 10% to 15%. Severe diffuse hypokinesis with no identifiable regional variations.  2. Hyponatremia  Secondary to free water excess volume overload, following  3. Hyperkalemia Kayexalate given  with good results Holding lisinopril and aldactone Following BMP  4. ARF Lasix on hold  Renal ultrasound=> no hydronephrosis  5. Cocaine and alcohol dependence  Pt reports ongoing daily alcohol use of roughly 8 drinks/day, and frequent cocaine use  Counseled toward cessation  Monitor with CIWA and prn Ativan   6. Insulin-dependent DM with hypoglycemia A1c was 12.0% in December 2017  Managed at home with metformin and Lantus 40 units qAM  Fsbs ac and qhs, ISS With acute renal failure will reduce insulin doses by more than 50%, lantus reduced to 15 units and sensitive SSI, no HS coverage, check 3am glucose   7. Depression, anxiety  Continue Zoloft qD, Ativan according to CIWA scoring   8. Hypertension  BP at goal  Managed at home with Coreg and lisinopril at home Coreg held given ongoing cocaine use and lisinopril held given the hyperkalemia    DVT prophylaxis:sq Lovenox  Code Status:Full  Family Communication:Discussed with patient Disposition Plan:Admit to telemetry Consults called:Heart Failure Team Admission status:Inpatient   Lab Results  Component Value Date   PLT 189 08/08/2016   Antibiotics  :   Anti-infectives    None        Objective:   Vitals:   08/07/16 1641 08/07/16 2054 08/07/16 2300 08/08/16 0604  BP: 119/88 (!) 131/92 126/84 114/73  Pulse:  65 70 71  Resp: 20 18 18 18   Temp:  97.6 F (36.4 C) 97.9 F (36.6 C) 97.4 F (36.3 C)  TempSrc:  Oral Oral Oral  SpO2: 99% 100% 100% 97%  Weight:    78.7 kg (173 lb 8 oz)  Height:        Wt Readings from Last 3 Encounters:  08/08/16 78.7 kg (173 lb 8 oz)  04/14/16 71.8 kg (158 lb 3.2 oz)  02/16/16 75.9 kg (167 lb 5.3 oz)     Intake/Output Summary (Last 24 hours) at 08/08/16 0751 Last data  filed at 08/08/16 0643  Gross per 24 hour  Intake             1430 ml  Output              400 ml  Net             1030 ml    Review of Systems As noted in history otherwise all reviewed  and reported negative     Physical Exam  Awake Alert, Oriented X 3, No new F.N deficits, Normal affect [place].AT,PERRAL, mm dry Supple Neck,No JVD, No cervical lymphadenopathy appriciated.  Symmetrical Chest wall movement, Good air movement bilaterally, CTAB RRR,No Gallops,Rubs or new Murmurs, No Parasternal Heave +ve B.Sounds, Abd Soft, No tenderness, No organomegaly appriciated, No rebound - guarding or rigidity. No Cyanosis, Clubbing or edema, No new Rash or bruise     Data Review:    CBC  Recent Labs Lab 08/05/16 1921 08/06/16 0103 08/07/16 0555 08/07/16 0918 08/08/16 0332  WBC 5.3 5.6 7.5 7.1 6.6  HGB 12.6* 12.6* 13.1 12.9* 13.1  HCT 35.5* 35.0* 36.5* 35.6* 36.4*  PLT 213 199 206 204 189  MCV 82.9 82.9 82.4 82.2 82.9  MCH 29.4 29.9 29.6 29.8 29.8  MCHC 35.5 36.0 35.9 36.2* 36.0  RDW 11.9 11.9 11.9 12.2 12.2  LYMPHSABS 1.7 1.5  --   --   --   MONOABS 0.5 0.4  --   --   --   EOSABS 0.0 0.0  --   --   --   BASOSABS 0.0 0.0  --   --   --     Chemistries   Recent Labs Lab 08/06/16 1439 08/07/16 0555 08/07/16 0918 08/07/16 1833 08/08/16 0332  NA 117* 119* 119* 118* 120*  K 5.2* 5.5* 5.3* 4.4 4.3  CL 86* 87* 83* 84* 88*  CO2 18* 20* 21* 22 20*  GLUCOSE 71 74 103* 120* 67  BUN 23* 32* 32* 40* 36*  CREATININE 1.99* 2.57* 2.72* 2.68* 2.33*  CALCIUM 8.1* 8.3* 8.4* 8.0* 8.3*  AST  --  217* 254*  --  482*  ALT  --  97* 110*  --  209*  ALKPHOS  --  102 101  --  109  BILITOT  --  1.9* 1.8*  --  1.1   ------------------------------------------------------------------------------------------------------------------ No results for input(s): CHOL, HDL, LDLCALC, TRIG, CHOLHDL, LDLDIRECT in the last 72 hours.  Lab Results  Component Value Date   HGBA1C 9.6 (H) 08/05/2016   ------------------------------------------------------------------------------------------------------------------ No results for input(s): TSH, T4TOTAL, T3FREE, THYROIDAB in the last 72  hours.  Invalid input(s): FREET3 ------------------------------------------------------------------------------------------------------------------ No results for input(s): VITAMINB12, FOLATE, FERRITIN, TIBC, IRON, RETICCTPCT in the last 72 hours.  Coagulation profile No results for input(s): INR, PROTIME in the last 168 hours.  No results for input(s): DDIMER in the last 72 hours.  Cardiac Enzymes No results for input(s): CKMB, TROPONINI, MYOGLOBIN in the last 168 hours.  Invalid input(s): CK ------------------------------------------------------------------------------------------------------------------    Component Value Date/Time   BNP 2,465.1 (H) 08/05/2016 1922    Inpatient Medications  Scheduled Meds: . enoxaparin (LOVENOX) injection  40 mg Subcutaneous Daily  . folic acid  1 mg Oral Daily  . insulin aspart  0-9 Units Subcutaneous TID WC  . insulin glargine  15 Units Subcutaneous Daily  . LORazepam  0-4 mg Intravenous Q6H   Followed by  . [START ON 08/09/2016] LORazepam  0-4 mg Intravenous Q12H  . multivitamin with minerals  1 tablet Oral  Daily  . pravastatin  10 mg Oral Daily  . sertraline  100 mg Oral QHS  . sodium chloride flush  3 mL Intravenous Q12H  . thiamine  100 mg Oral Daily  . traZODone  150 mg Oral QHS   Continuous Infusions: . dextrose 5 % and 0.9% NaCl 50 mL/hr at 08/07/16 1759   PRN Meds:.sodium chloride, acetaminophen, LORazepam **OR** LORazepam, ondansetron (ZOFRAN) IV, sodium chloride flush  Micro Results No results found for this or any previous visit (from the past 240 hour(s)).  Radiology Reports Dg Chest 2 View  Result Date: 08/05/2016 CLINICAL DATA:  Drowsiness/lethargy, shortness of breath. EXAM: CHEST  2 VIEW COMPARISON:  04/12/2016 FINDINGS: Moderate bilateral pleural effusions with bilateral lower lobe airspace opacities, some worsening compared to 04/12/16. Pneumonia cannot be excluded. Faint interstitial accentuation. Prominent  retrosternal clear space compatible with emphysema. IMPRESSION: 1. Chronic bilateral pleural effusions and chronic bilateral lower lobe airspace opacities. Pleural effusions seems slightly larger than on 04/12/2016. Electronically Signed   By: Gaylyn Rong M.D.   On: 08/05/2016 20:21   US Renal  Result Date: 08/07/2016 CLINICAL DATA:  Acute renal failure. EXAM: RENAL / URINARY TRACT ULTRASOUND COMPLETE COMPARISON:  Two-view chest x-ray 08/05/2016 FINDINGS: Right Kidney: Length: 13.0 cm, within normal limits. Echogenicity within normal limits. No mass or hydronephrosis visualized. Left Kidney: Length: 12.9 cm, within normal limits. Echogenicity within normal limits. There is mild dilation of the renal collecting system without significant hydronephrosis. Bladder: Appears normal for degree of bladder distention. Moderate bilateral pleural effusions are noted, right greater left. A small amount of abdominal ascites is present as well. IMPRESSION: 1. Mild dilation of the left renal collecting system without significant hydronephrosis. 2. The right kidney is normal. 3. Moderate bilateral pleural effusions, right greater than left. 4. Small volume ascites. Electronically Signed   By: Marin Roberts M.D.   On: 08/07/2016 11:16    Time Spent in minutes  30   Clanford Johnson M.D on 08/08/2016 at 7:51 AM  Between 7am to 7pm - Pager - [phone]  After 7pm go to [email or web address] - password [email or web address]  [facility] -  Office  [phone]

## 2016-08-08 NOTE — Care Management Important Message (Signed)
Important Message  Patient Details  Name: [name] MRN: [id number] Date of Birth: 08-28-1955   Medicare Important Message Given:  Yes    Kyla Balzarine 08/08/2016, 2:48 PM

## 2016-08-08 NOTE — Progress Notes (Signed)
PT Cancellation Note  Patient Details Name: [name] MRN: [id number] DOB: 26-Aug-1955   Cancelled Treatment:    Reason Eval/Treat Not Completed: Patient at procedure or test/unavailable. Pt undergoing PICC line placement and then headed to abdominal  US. PT to return as able to complete PT eval.   Charly Hunton M Ladene Allocca 08/08/2016, 3:51 PM   Lewis Shock, PT, DPT Pager #: [phone] Office #: [phone]

## 2016-08-09 DIAGNOSIS — R0601 Orthopnea: Secondary | ICD-10-CM

## 2016-08-09 DIAGNOSIS — R945 Abnormal results of liver function studies: Secondary | ICD-10-CM

## 2016-08-09 DIAGNOSIS — R7989 Other specified abnormal findings of blood chemistry: Secondary | ICD-10-CM

## 2016-08-09 DIAGNOSIS — N179 Acute kidney failure, unspecified: Secondary | ICD-10-CM

## 2016-08-09 DIAGNOSIS — R0602 Shortness of breath: Secondary | ICD-10-CM

## 2016-08-09 LAB — BASIC METABOLIC PANEL
ANION GAP: 10 (ref 5–15)
BUN: 29 mg/dL — ABNORMAL HIGH (ref 6–20)
CO2: 24 mmol/L (ref 22–32)
Calcium: 7.7 mg/dL — ABNORMAL LOW (ref 8.9–10.3)
Chloride: 90 mmol/L — ABNORMAL LOW (ref 101–111)
Creatinine, Ser: 1.55 mg/dL — ABNORMAL HIGH (ref 0.61–1.24)
GFR, EST AFRICAN AMERICAN: 54 mL/min — AB (ref >60)
GFR, EST NON AFRICAN AMERICAN: 46 mL/min — AB (ref >60)
Glucose, Bld: 94 mg/dL (ref 65–99)
POTASSIUM: 3.4 mmol/L — AB (ref 3.5–5.1)
SODIUM: 124 mmol/L — AB (ref 135–145)

## 2016-08-09 LAB — GLUCOSE, CAPILLARY
GLUCOSE-CAPILLARY: 106 mg/dL — AB (ref 65–99)
GLUCOSE-CAPILLARY: 199 mg/dL — AB (ref 65–99)
GLUCOSE-CAPILLARY: 189 mg/dL — AB (ref 65–99)
Glucose-Capillary: 116 mg/dL — ABNORMAL HIGH (ref 65–99)
Glucose-Capillary: 155 mg/dL — ABNORMAL HIGH (ref 65–99)

## 2016-08-09 LAB — HEPATIC FUNCTION PANEL
ALT: 143 U/L — ABNORMAL HIGH (ref 17–63)
AST: 172 U/L — ABNORMAL HIGH (ref 15–41)
Albumin: 2.3 g/dL — ABNORMAL LOW (ref 3.5–5.0)
Alkaline Phosphatase: 87 U/L (ref 38–126)
BILIRUBIN DIRECT: 0.6 mg/dL — AB (ref 0.1–0.5)
BILIRUBIN TOTAL: 1.3 mg/dL — AB (ref 0.3–1.2)
Indirect Bilirubin: 0.7 mg/dL (ref 0.3–0.9)
Total Protein: 5.2 g/dL — ABNORMAL LOW (ref 6.5–8.1)

## 2016-08-09 LAB — COOXEMETRY PANEL
CARBOXYHEMOGLOBIN: 1.8 % — AB (ref 0.5–1.5)
METHEMOGLOBIN: 0.8 % (ref 0.0–1.5)
O2 SAT: 68.2 %
TOTAL HEMOGLOBIN: 11.6 g/dL — AB (ref 12.0–16.0)

## 2016-08-09 LAB — MAGNESIUM: MAGNESIUM: 1.7 mg/dL (ref 1.7–2.4)

## 2016-08-09 MED ORDER — INSULIN GLARGINE 100 UNIT/ML ~~LOC~~ SOLN: 12.0000 [IU] | Freq: Every day | SUBCUTANEOUS | Status: DC

## 2016-08-09 MED ORDER — POTASSIUM CHLORIDE CRYS ER 20 MEQ PO TBCR
40.0000 meq | EXTENDED_RELEASE_TABLET | Freq: Once | ORAL | Status: AC
  Administered 2016-08-09: 40 meq via ORAL
  Filled 2016-08-09: qty 2

## 2016-08-09 MED ORDER — ISOSORBIDE MONONITRATE ER 30 MG PO TB24
15.0000 mg | ORAL_TABLET | Freq: Every day | ORAL | Status: DC
  Administered 2016-08-09: 15 mg via ORAL
  Filled 2016-08-09: qty 1

## 2016-08-09 MED ORDER — HYDRALAZINE HCL 25 MG PO TABS
12.5000 mg | ORAL_TABLET | Freq: Three times a day (TID) | ORAL | Status: DC
  Administered 2016-08-09 – 2016-08-10 (×4): 12.5 mg via ORAL
  Filled 2016-08-09 (×4): qty 1

## 2016-08-09 MED ORDER — MAGNESIUM SULFATE 2 GM/50ML IV SOLN
2.0000 g | Freq: Once | INTRAVENOUS | Status: AC
  Administered 2016-08-09: 2 g via INTRAVENOUS
  Filled 2016-08-09: qty 50

## 2016-08-09 MED ORDER — DIGOXIN 125 MCG PO TABS
0.1250 mg | ORAL_TABLET | Freq: Every day | ORAL | Status: DC
  Administered 2016-08-09 – 2016-08-13 (×5): 0.125 mg via ORAL
  Filled 2016-08-09 (×5): qty 1

## 2016-08-09 MED ORDER — INSULIN GLARGINE 100 UNIT/ML ~~LOC~~ SOLN
12.0000 [IU] | Freq: Every day | SUBCUTANEOUS | Status: DC
  Administered 2016-08-09 – 2016-08-13 (×5): 12 [IU] via SUBCUTANEOUS
  Filled 2016-08-09 (×5): qty 0.12

## 2016-08-09 NOTE — Progress Notes (Signed)
Advanced Heart Failure Rounding Note   Subjective:    Yesterday moved to SDU for PICC and CO-OX. Initial CO-OX 40% so milrinone 0.25 mcg started. Creatinine trending down 2.3> 1.5.  CVP 6 this morning (personally checked) with co-ox 68%.   Denies SOB/CP. Hungry.    Objective:   Weight Range:  Vital Signs:   Temp:  [97.3 F (36.3 C)-97.5 F (36.4 C)] 97.5 F (36.4 C) (04/17 0300) Pulse Rate:  [67-79] 67 (04/17 0427) Resp:  [17-21] 17 (04/17 0427) BP: (109-144)/(69-95) 109/69 (04/17 0427) SpO2:  [93 %-96 %] 96 % (04/17 0427) Weight:  [180 lb 14.4 oz (82.1 kg)] 180 lb 14.4 oz (82.1 kg) (04/17 0300) Last BM Date: 08/07/16  Weight change: Filed Weights   08/07/16 0503 08/08/16 0604 08/09/16 0300  Weight: 172 lb 6.4 oz (78.2 kg) 173 lb 8 oz (78.7 kg) 180 lb 14.4 oz (82.1 kg)    Intake/Output:   Intake/Output Summary (Last 24 hours) at 08/09/16 0725 Last data filed at 08/09/16 0500  Gross per 24 hour  Intake          1232.77 ml  Output              650 ml  Net           582.77 ml     Physical Exam: CVP 6  General:  Elderly. Chronically ill. No resp difficulty HEENT: normal Neck: supple. JVP 5-6 . Carotids 2+ bilat; no bruits. No lymphadenopathy or thryomegaly appreciated. Cor: PMI nondisplaced. Regular rate & rhythm. No rubs,  or murmurs. + S2  Lungs: Decreased in the bases on 2 liters  Abdomen: soft, nontender, nondistended. No hepatosplenomegaly. No bruits or masses. Good bowel sounds. Extremities: no cyanosis, clubbing, rash, edema. R and LLE SCDs. RUE PICC Neuro: alert & orientedx3, cranial nerves grossly intact. moves all 4 extremities w/o difficulty. Affect pleasant  Telemetry: NSR 70s personally reviewed.   Labs: Basic Metabolic Panel:  Recent Labs Lab 08/07/16 0555 08/07/16 0918 08/07/16 1833 08/08/16 0332 08/09/16 0500  NA 119* 119* 118* 120* 124*  K 5.5* 5.3* 4.4 4.3 3.4*  CL 87* 83* 84* 88* 90*  CO2 20* 21* 22 20* 24  GLUCOSE 74 103* 120*  67 94  BUN 32* 32* 40* 36* 29*  CREATININE 2.57* 2.72* 2.68* 2.33* 1.55*  CALCIUM 8.3* 8.4* 8.0* 8.3* 7.7*  MG  --   --   --   --  1.7    Liver Function Tests:  Recent Labs Lab 08/07/16 0555 08/07/16 0918 08/08/16 0332  AST 217* 254* 482*  ALT 97* 110* 209*  ALKPHOS 102 101 109  BILITOT 1.9* 1.8* 1.1  PROT 6.2* 6.2* 6.3*  ALBUMIN 2.6* 2.8* 2.7*   No results for input(s): LIPASE, AMYLASE in the last 168 hours.  Recent Labs Lab 08/08/16 0955  AMMONIA 25    CBC:  Recent Labs Lab 08/05/16 1921 08/06/16 0103 08/07/16 0555 08/07/16 0918 08/08/16 0332  WBC 5.3 5.6 7.5 7.1 6.6  NEUTROABS 3.1 3.8  --   --   --   HGB 12.6* 12.6* 13.1 12.9* 13.1  HCT 35.5* 35.0* 36.5* 35.6* 36.4*  MCV 82.9 82.9 82.4 82.2 82.9  PLT 213 199 206 204 189    Cardiac Enzymes: No results for input(s): CKTOTAL, CKMB, CKMBINDEX, TROPONINI in the last 168 hours.  BNP: BNP (last 3 results)  Recent Labs  09/14/15 0518 04/10/16 2203 08/05/16 1922  BNP 862.7* 1,828.2* 2,465.1*    ProBNP (  last 3 results) No results for input(s): PROBNP in the last 8760 hours.    Other results:  Imaging: US Abdomen Complete  Result Date: 08/08/2016 CLINICAL DATA:  Elevated LFTs.  Initial encounter. EXAM: ABDOMEN ULTRASOUND COMPLETE COMPARISON:  Abdominal ultrasound performed 02/21/2014, and CT of the abdomen and pelvis performed 10/23/2008 FINDINGS: Gallbladder: No gallstones or wall thickening visualized. No sonographic Murphy sign noted by sonographer. Common bile duct: Diameter: 0.2 cm, within normal limits in caliber. Liver: No focal lesion identified. Within normal limits in parenchymal echogenicity. IVC: No abnormality visualized. Pancreas: Visualized portion unremarkable. Spleen: Size and appearance within normal limits. Right Kidney: Length: 13.2 cm. Echogenicity within normal limits. No mass or hydronephrosis visualized. Left Kidney: Length: 12.2 cm. Echogenicity within normal limits. Left-sided  renal pelvicaliectasis is stable from prior studies. No significant hydronephrosis or mass visualized. Abdominal aorta: No aneurysm visualized. There is obscuration of the mid abdominal aorta and the common iliac arteries overlying structures. Other findings: Small bilateral pleural effusions are seen. IMPRESSION: 1. No acute abnormality seen to explain the patient's symptoms. 2. Small bilateral pleural effusions seen. Electronically Signed   By: Roanna Raider M.D.   On: 08/08/2016 20:51   US Renal  Result Date: 08/07/2016 CLINICAL DATA:  Acute renal failure. EXAM: RENAL / URINARY TRACT ULTRASOUND COMPLETE COMPARISON:  Two-view chest x-ray 08/05/2016 FINDINGS: Right Kidney: Length: 13.0 cm, within normal limits. Echogenicity within normal limits. No mass or hydronephrosis visualized. Left Kidney: Length: 12.9 cm, within normal limits. Echogenicity within normal limits. There is mild dilation of the renal collecting system without significant hydronephrosis. Bladder: Appears normal for degree of bladder distention. Moderate bilateral pleural effusions are noted, right greater left. A small amount of abdominal ascites is present as well. IMPRESSION: 1. Mild dilation of the left renal collecting system without significant hydronephrosis. 2. The right kidney is normal. 3. Moderate bilateral pleural effusions, right greater than left. 4. Small volume ascites. Electronically Signed   By: Marin Roberts M.D.   On: 08/07/2016 11:16      Medications:     Scheduled Medications: . enoxaparin (LOVENOX) injection  40 mg Subcutaneous Daily  . folic acid  1 mg Oral Daily  . insulin aspart  0-9 Units Subcutaneous TID WC  . insulin glargine  15 Units Subcutaneous Daily  . LORazepam  0-4 mg Intravenous Q12H  . magnesium sulfate 1 - 4 g bolus IVPB  2 g Intravenous Once  . multivitamin with minerals  1 tablet Oral Daily  . potassium chloride  40 mEq Oral Once  . pravastatin  10 mg Oral Daily  . sertraline   100 mg Oral QHS  . sodium chloride flush  10-40 mL Intracatheter Q12H  . sodium chloride flush  3 mL Intravenous Q12H  . thiamine  100 mg Oral Daily  . traZODone  150 mg Oral QHS     Infusions: . milrinone 0.25 mcg/kg/min (08/09/16 0500)     PRN Medications:  sodium chloride, acetaminophen, ondansetron (ZOFRAN) IV, sodium chloride flush, sodium chloride flush   Assessment/Plan/Discussion  1. Cardiogenic Shock  Initial CO-OX 40%. Milrinone started 0.25 mcg. Todays CO-OX up to 68%.  Plan to continue milrinone until LFTs/renal comes down.   2. A/C Combined Systolic/Diastolic Heart Failure ECHO 08/07/2016 EF 10-15%. Peak PA pressure 69 mm hg RV mildly dilated.  No bb with suspected low output.  CVP 6. Hold off on lasix.  No Arb, dig, spiro with AKI.  Add 12.5 mg hydralazine three times a day and  15 mg imdur daily.  2. Hyponatremia Sodium on admit 118. Todays sodium is 124. Restrict free water.  3. AKI- Creatinine on admit 1.1.  Creatinine trending down from 2.3>1.55.   Renal US on 4/15 without evidence of hydronephrosis 4. Elevated LFTs Suspect in the setting of cardiogenic shock. LFTs continue to rise.  Ammonia level 25. [place] no abnormalities. Repeat LFTs.  He is not on a statin.  5. CAD- non obstructive on LHC 2014. No statin with elevated LFTs. No CP.  6. ETOH abuse- Drinks multiple beers daily. May need rehab.  7. Substance Abuse- + Cocaine on admit.  8. Tobacco Abuse- Counseled on smoking cessation.  9. Mag 1.7- Give 2 grams mag.   He is not candidate for advanced therapies or home inotropes with substance abuse. Suspect he will be hard to manage given poor insight. Will refer to HF Paramedicine.    Length of Stay: 4   Amy Clegg NP-C  08/09/2016, 7:25 AM  Advanced Heart Failure Team Pager [phone] (M-F; 7a - 4p)  Please contact [facility] for night-coverage after hours (4p -7a ) and weekends on [email or web address]  Patient seen with NP, agree with the above note.    1. Acute on chronic systolic CHF: Echo with EF 10-15%.  Cath 2014 with nonobstructive CAD.  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  Admitted with dyspnea, initially given Lasix but has had significant rise in creatinine and LFTs.  Concerning for low output state => confirmed by low co-ox.  Now on milrinone with co-ox up to 68% and falling creatinine. CVP is only 6.   - Not candidate for advanced therapies with active substance abuse.  - Continue milrinone 0.25 to allow renal and hepatic function to improve.  He will not be a home milrinone candidate with substance abuse.  Will start titrating po meds to get him off the milrinone.  - Add hydralazine/nitrates today at low dose.  - Add digoxin 0.125 daily.  - Eventually will add ARB/spironolactone as creatinine comes down if BP stays stable.   2. AKI: Cardiorenal due to low output.  Improving with milrinone use.  3. ETOH abuse: Heavy drinker, advised to quit.  4. Cocaine: Active use, advised to quit.  5. Elevated LFTs: Concerning that this is due to low cardiac output. NH3 not elevated and abdominal US not suggestive of cirrhosis.  Need to add on LFTs to labs today.  6. Hyponatremia: In setting of low output HF.  Improving, up to 124 today.  - Continue fluid restriction.  - CVP only 6, would avoid tolvaptan for now as improving with fluid restriction alone.   Mobilize with PT, cardiac rehab.   Marca Ancona 08/09/2016 8:02 AM

## 2016-08-09 NOTE — Evaluation (Signed)
Physical Therapy Evaluation Patient Details Name: [name] MRN: [id number] DOB: May 05, 1955 Today's Date: 08/09/2016   History of Present Illness  61 y.o. male with medical history significant for chronic combined systolic/diastolic CHF, nonobstructive CAD, depression with anxiety, hypertension, and insulin-dependent diabetes mellitus presented with 2-3 days of progressive exertional dyspnea and orthopnea.  Patient continues with ETOH use and cocaine use.   Clinical Impression  Patient presents with decreased independence and safety with mobility due to weakness, decreased activity tolerance, decreased balance and poor safety awareness.  Previously reports trouble walking, but was independent at home.  Will continue skilled PT in the acute setting and recommend follow up SNF level rehab.     Follow Up Recommendations SNF    Equipment Recommendations  None recommended by PT    Recommendations for Other Services       Precautions / Restrictions Precautions Precautions: Fall      Mobility  Bed Mobility Overal bed mobility: Needs Assistance Bed Mobility: Supine to Sit;Sit to Supine     Supine to sit: Mod assist;HOB elevated Sit to supine: Min assist;HOB elevated   General bed mobility comments: assist for lifting trunk after increased time to get legs off bed, assist for guiding legs into bed to supine  Transfers Overall transfer level: Needs assistance Equipment used: Rolling walker (2 wheeled) Transfers: Sit to/from Stand Sit to Stand: Min assist            Ambulation/Gait Ambulation/Gait assistance: Min assist;Mod assist Ambulation Distance (Feet): 75 Feet Assistive device: Rolling walker (2 wheeled) Gait Pattern/deviations: Step-to pattern;Step-through pattern;Decreased stride length;Trunk flexed;Shuffle;Wide base of support     General Gait Details: increaesed time, difficulty turning walker, poor tolerance with c/o fatigue and wanting to terminate prior  to goal.  Pt sat on EOB quickly due to fagitue at end of ambulation and reports legs numb with ambulation  Stairs            Wheelchair Mobility    Modified Rankin (Stroke Patients Only)       Balance Overall balance assessment: Needs assistance   Sitting balance-Leahy Scale: Fair     Standing balance support: Bilateral upper extremity supported Standing balance-Leahy Scale: Poor Standing balance comment: UE support for balance                             Pertinent Vitals/Pain Pain Assessment: No/denies pain    Home Living Family/patient expects to be discharged to:: Private residence Living Arrangements: Non-relatives/Friends Available Help at Discharge: Available PRN/intermittently;Friend(s) Type of Home: House Home Access: Stairs to enter     Home Layout: One level Home Equipment: Environmental consultant - 2 wheels;Cane - single point      Prior Function Level of Independence: Needs assistance   Gait / Transfers Assistance Needed: reports recent trouble walking with some falls           Hand Dominance        Extremity/Trunk Assessment   Upper Extremity Assessment Upper Extremity Assessment: Generalized weakness    Lower Extremity Assessment Lower Extremity Assessment: Generalized weakness    Cervical / Trunk Assessment Cervical / Trunk Assessment: Kyphotic  Communication   Communication: Expressive difficulties (mumbles, note swelling of lips)  Cognition Arousal/Alertness: Lethargic Behavior During Therapy: Flat affect Overall Cognitive Status: Difficult to assess  General Comments      Exercises     Assessment/Plan    PT Assessment Patient needs continued PT services  PT Problem List Decreased strength;Decreased activity tolerance;Decreased balance;Decreased safety awareness;Decreased mobility;Decreased knowledge of use of DME;Decreased cognition       PT Treatment  Interventions DME instruction;Gait training;Balance training;Therapeutic exercise;Patient/family education;Therapeutic activities;Functional mobility training;Stair training    PT Goals (Current goals can be found in the Care Plan section)  Acute Rehab PT Goals Patient Stated Goal: None stated, agrees to rehab PT Goal Formulation: With patient/family Time For Goal Achievement: 08/23/16 Potential to Achieve Goals: Good    Frequency Min 3X/week   Barriers to discharge Decreased caregiver support      Co-evaluation               End of Session Equipment Utilized During Treatment: Gait belt Activity Tolerance: Patient limited by fatigue Patient left: in bed;with call bell/phone within reach;with family/visitor present;with bed alarm set   PT Visit Diagnosis: Other abnormalities of gait and mobility (R26.89);History of falling (Z91.81);Muscle weakness (generalized) (M62.81)    Time: 1610-9604 PT Time Calculation (min) (ACUTE ONLY): 25 min   Charges:   PT Evaluation $PT Eval Moderate Complexity: 1 Procedure PT Treatments $Gait Training: 8-22 mins   PT G CodesSheran Lawless,  [phone] 08/09/2016   Elray Mcgregor 08/09/2016, 4:02 PM

## 2016-08-09 NOTE — Progress Notes (Signed)
Patient ID: [name], male   DOB: 12-26-1955, 61 y.o.   MRN: [id number]                                                                PROGRESS NOTE                                                                                                                                                                                                             Patient Demographics:    [name], is a 61 y.o. male, DOB - Oct 26, 1955, [id number]  Admit date - 08/05/2016   Admitting Physician Briscoe Deutscher, MD  Outpatient Primary MD for the patient is [facility]  LOS - 4  Outpatient Specialists:    Chief Complaint  Patient presents with  . Shortness of Breath       Brief Narrative  61 y.o.malewith medical history significant forchronic combined systolic/diastolic CHF, nonobstructive CAD, depression with anxiety, hypertension, and insulin-dependent diabetes mellitus presents the emergency department with 2-3 days of progressive exertional dyspnea and orthopnea. Patient reports that he had been in his usual state until 2 or 3 days ago when he noted increasing dyspnea with exertion. He also noted worsening orthopnea at that time and all of this has steadily worsened since onset. He reports sleeping poorly last night due to dyspnea despite sleeping upright. He continues to be short of breath while at rest today. He denies any chest pain or palpitations and denies any significant swelling or tenderness in the lower extremities. He reports a dry cough. He denies fevers or chills. He reports continued daily alcohol use, drinking two 40 oz bottles of malt liquor daily. He reports ongoing cocaine abuse, both insufflated and smoked, with last use yesterday.  ED Course:Upon arrival to the ED, patient is found to beafebrile, saturating adequately on room air, mildly tachypneic, and with vitals otherwise stable. EKG features a sinus rhythm with nonspecific interventricular conduction  delay. Chest x-ray is notable for chronic bilateral effusions and chronic bilateral lower lobe airspace opacities. Effusions appear slightly larger than the study from December 2017. Chemistry panel features a sodium of 118, potassium 5.4, and glucose of 219. CBC features a mild normocytic anemia with hemoglobin of 12.6. Troponin is within the normal limits and BNP is elevated to  a value of 2465. Patient remained tachypneic and dyspneic while at rest in the ED, but hemodynamically stable. He will be admitted to the telemetry unit for ongoing evaluation and management of dyspnea and orthopnea secondary to acute on chronic combined systolic/diastolic CHF, complicated by electrolyte derangements.    Subjective:    [name] sleepy but arousable, says that he is breathing better, very SOB with minimal ambulation    Assessment  & Plan :    Principal Problem:   Acute on chronic combined systolic and diastolic CHF (congestive heart failure) (HCC) Active Problems:   Hypertension   History of substance abuse   Non Obstructive CAD (coronary artery disease)   Hyperkalemia   Hyponatremia   Polysubstance abuse   Type 2 diabetes mellitus with hyperglycemia, with long-term current use of insulin (HCC)   Depression with anxiety   ARF (acute renal failure) (HCC)   1. Acute on chronic combined CHF EF 10-15% I consulted the heart failure team 4/16 and they moved him to SDU for IV milrinone Strict I and O, daily weights.   ECHO 4/15 : Systolic function was severely reduced. The  estimated ejection fraction was in the range of 10% to 15%. Severe diffuse hypokinesis with no identifiable regional variations. - HF team added hydralazine/nitrates and digoxin 4/17 as renal function has been improving  2. Hyponatremia  Improving with fluid restriction, follow BMP  3. Hyperkalemia Kayexalate given with good results  4. ARF Renal ultrasound=> no hydronephrosis Creatinine improving   5. Cocaine and  alcohol dependence  Pt reports ongoing daily alcohol use of roughly 8 drinks/day, and frequent cocaine use  Counseled toward cessation  Monitor with CIWA and prn Ativan   6. Insulin-dependent DM with hypoglycemia A1c was 12.0% in December 2017  Managed at home with metformin and Lantus 40 units qAM  Fsbs ac and qhs, ISS lantus reduced to 12 units and sensitive SSI, no HS coverage, check 3am glucose   7. Depression, anxiety  Continue Zoloft qD, Ativan according to CIWA scoring   8. Hypertension  BP at goal  Managed at home with Coreg and lisinopril at home Coreg held given ongoing cocaine use and lisinopril held given the hyperkalemia   DVT prophylaxis:sq Lovenox  Code Status:Full  Family Communication:Discussed with patient Disposition Plan:Admit to telemetry Consults called:Heart Failure Team Admission status:Inpatient   Lab Results  Component Value Date   PLT 189 08/08/2016   Antibiotics  :   Anti-infectives    None        Objective:   Vitals:   08/08/16 2300 08/09/16 0300 08/09/16 0427 08/09/16 0750  BP: 125/83 129/84 109/69 114/77  Pulse: 79 74 67 72  Resp: (!) 21 20 17 18   Temp: 97.3 F (36.3 C) 97.5 F (36.4 C)  97.5 F (36.4 C)  TempSrc: Oral Axillary  Oral  SpO2: 95% 93% 96% 95%  Weight:  82.1 kg (180 lb 14.4 oz)    Height:        Wt Readings from Last 3 Encounters:  08/09/16 82.1 kg (180 lb 14.4 oz)  04/14/16 71.8 kg (158 lb 3.2 oz)  02/16/16 75.9 kg (167 lb 5.3 oz)     Intake/Output Summary (Last 24 hours) at 08/09/16 1105 Last data filed at 08/09/16 0905  Gross per 24 hour  Intake          1304.57 ml  Output              650 ml  Net  654.57 ml    Review of Systems As noted in history otherwise all reviewed and reported negative     Physical Exam  Awake Alert, Oriented X 3, Normal affect, very thin. [place].AT, muddy sclera Supple Neck,No JVD, No cervical lymphadenopathy appriciated.  Symmetrical Chest wall  movement, Good air movement bilaterally, rare crackles, nl s1,s2  +ve B.Sounds, Abd Soft, No tenderness, No organomegaly appriciated, No rebound - guarding or rigidity. trace edema, No new Rash or bruise     Data Review:    CBC  Recent Labs Lab 08/05/16 1921 08/06/16 0103 08/07/16 0555 08/07/16 0918 08/08/16 0332  WBC 5.3 5.6 7.5 7.1 6.6  HGB 12.6* 12.6* 13.1 12.9* 13.1  HCT 35.5* 35.0* 36.5* 35.6* 36.4*  PLT 213 199 206 204 189  MCV 82.9 82.9 82.4 82.2 82.9  MCH 29.4 29.9 29.6 29.8 29.8  MCHC 35.5 36.0 35.9 36.2* 36.0  RDW 11.9 11.9 11.9 12.2 12.2  LYMPHSABS 1.7 1.5  --   --   --   MONOABS 0.5 0.4  --   --   --   EOSABS 0.0 0.0  --   --   --   BASOSABS 0.0 0.0  --   --   --     Chemistries   Recent Labs Lab 08/07/16 0555 08/07/16 0918 08/07/16 1833 08/08/16 0332 08/09/16 0500  NA 119* 119* 118* 120* 124*  K 5.5* 5.3* 4.4 4.3 3.4*  CL 87* 83* 84* 88* 90*  CO2 20* 21* 22 20* 24  GLUCOSE 74 103* 120* 67 94  BUN 32* 32* 40* 36* 29*  CREATININE 2.57* 2.72* 2.68* 2.33* 1.55*  CALCIUM 8.3* 8.4* 8.0* 8.3* 7.7*  MG  --   --   --   --  1.7  AST 217* 254*  --  482* 172*  ALT 97* 110*  --  209* 143*  ALKPHOS 102 101  --  109 87  BILITOT 1.9* 1.8*  --  1.1 1.3*   ------------------------------------------------------------------------------------------------------------------ No results for input(s): CHOL, HDL, LDLCALC, TRIG, CHOLHDL, LDLDIRECT in the last 72 hours.  Lab Results  Component Value Date   HGBA1C 9.6 (H) 08/05/2016   ------------------------------------------------------------------------------------------------------------------ No results for input(s): TSH, T4TOTAL, T3FREE, THYROIDAB in the last 72 hours.  Invalid input(s): FREET3 ------------------------------------------------------------------------------------------------------------------ No results for input(s): VITAMINB12, FOLATE, FERRITIN, TIBC, IRON, RETICCTPCT in the last 72  hours.  Coagulation profile No results for input(s): INR, PROTIME in the last 168 hours.  No results for input(s): DDIMER in the last 72 hours.  Cardiac Enzymes No results for input(s): CKMB, TROPONINI, MYOGLOBIN in the last 168 hours.  Invalid input(s): CK ------------------------------------------------------------------------------------------------------------------    Component Value Date/Time   BNP 2,465.1 (H) 08/05/2016 1922   Inpatient Medications  Scheduled Meds: . digoxin  0.125 mg Oral Daily  . enoxaparin (LOVENOX) injection  40 mg Subcutaneous Daily  . folic acid  1 mg Oral Daily  . hydrALAZINE  12.5 mg Oral Q8H  . insulin aspart  0-9 Units Subcutaneous TID WC  . insulin glargine  15 Units Subcutaneous Daily  . isosorbide mononitrate  15 mg Oral Daily  . LORazepam  0-4 mg Intravenous Q12H  . multivitamin with minerals  1 tablet Oral Daily  . pravastatin  10 mg Oral Daily  . sertraline  100 mg Oral QHS  . sodium chloride flush  10-40 mL Intracatheter Q12H  . sodium chloride flush  3 mL Intravenous Q12H  . thiamine  100 mg Oral Daily  . traZODone  150 mg Oral QHS   Continuous Infusions: . sodium chloride    . milrinone 0.25 mcg/kg/min (08/09/16 0500)   PRN Meds:.sodium chloride, acetaminophen, ondansetron (ZOFRAN) IV, sodium chloride flush, sodium chloride flush  Micro Results No results found for this or any previous visit (from the past 240 hour(s)).  Radiology Reports Dg Chest 2 View  Result Date: 08/05/2016 CLINICAL DATA:  Drowsiness/lethargy, shortness of breath. EXAM: CHEST  2 VIEW COMPARISON:  04/12/2016 FINDINGS: Moderate bilateral pleural effusions with bilateral lower lobe airspace opacities, some worsening compared to 04/12/16. Pneumonia cannot be excluded. Faint interstitial accentuation. Prominent retrosternal clear space compatible with emphysema. IMPRESSION: 1. Chronic bilateral pleural effusions and chronic bilateral lower lobe airspace  opacities. Pleural effusions seems slightly larger than on 04/12/2016. Electronically Signed   By: Gaylyn Rong M.D.   On: 08/05/2016 20:21   US Abdomen Complete  Result Date: 08/08/2016 CLINICAL DATA:  Elevated LFTs.  Initial encounter. EXAM: ABDOMEN ULTRASOUND COMPLETE COMPARISON:  Abdominal ultrasound performed 02/21/2014, and CT of the abdomen and pelvis performed 10/23/2008 FINDINGS: Gallbladder: No gallstones or wall thickening visualized. No sonographic Murphy sign noted by sonographer. Common bile duct: Diameter: 0.2 cm, within normal limits in caliber. Liver: No focal lesion identified. Within normal limits in parenchymal echogenicity. IVC: No abnormality visualized. Pancreas: Visualized portion unremarkable. Spleen: Size and appearance within normal limits. Right Kidney: Length: 13.2 cm. Echogenicity within normal limits. No mass or hydronephrosis visualized. Left Kidney: Length: 12.2 cm. Echogenicity within normal limits. Left-sided renal pelvicaliectasis is stable from prior studies. No significant hydronephrosis or mass visualized. Abdominal aorta: No aneurysm visualized. There is obscuration of the mid abdominal aorta and the common iliac arteries overlying structures. Other findings: Small bilateral pleural effusions are seen. IMPRESSION: 1. No acute abnormality seen to explain the patient's symptoms. 2. Small bilateral pleural effusions seen. Electronically Signed   By: Roanna Raider M.D.   On: 08/08/2016 20:51   US Renal  Result Date: 08/07/2016 CLINICAL DATA:  Acute renal failure. EXAM: RENAL / URINARY TRACT ULTRASOUND COMPLETE COMPARISON:  Two-view chest x-ray 08/05/2016 FINDINGS: Right Kidney: Length: 13.0 cm, within normal limits. Echogenicity within normal limits. No mass or hydronephrosis visualized. Left Kidney: Length: 12.9 cm, within normal limits. Echogenicity within normal limits. There is mild dilation of the renal collecting system without significant hydronephrosis.  Bladder: Appears normal for degree of bladder distention. Moderate bilateral pleural effusions are noted, right greater left. A small amount of abdominal ascites is present as well. IMPRESSION: 1. Mild dilation of the left renal collecting system without significant hydronephrosis. 2. The right kidney is normal. 3. Moderate bilateral pleural effusions, right greater than left. 4. Small volume ascites. Electronically Signed   By: Marin Roberts M.D.   On: 08/07/2016 11:16    Time Spent in minutes  24    Standley Dakins M.D on 08/09/2016 at 11:05 AM  Between 7am to 7pm - Pager - [phone]  After 7pm go to [email or web address] - password [facility]  [facility] -  Office  [phone]

## 2016-08-09 NOTE — Progress Notes (Signed)
Inpatient Diabetes Program Recommendations  AACE/ADA: New Consensus Statement on Inpatient Glycemic Control (2015)  Target Ranges:  Prepandial:   less than 140 mg/dL      Peak postprandial:   less than 180 mg/dL (1-2 hours)      Critically ill patients:  140 - 180 mg/dL   Lab Results  Component Value Date   GLUCAP 199 (H) 08/09/2016   HGBA1C 9.6 (H) 08/05/2016    Review of Glycemic ControlResults for STCLAIR, WILD (MRN [id number]) as of 08/09/2016 14:06  Ref. Range 08/08/2016 17:12 08/08/2016 21:47 08/09/2016 03:26 08/09/2016 07:48 08/09/2016 11:12  Glucose-Capillary Latest Ref Range: 65 - 99 mg/dL 031 (H) 594 (H) 585 (H) 116 (H) 199 (H)    Diabetes history: Type 2 diabetes Outpatient Diabetes medications: Lantus 20 units daily (per patient report)- although med rec states that patient not taking? Current orders for Inpatient glycemic control:  Lantus 12 units daily, Novolog sensitive tid with meals  Inpatient Diabetes Program Recommendations:    Attempted to speak with patient regarding home insulin. I asked how much Lantus he takes at home, and he states Lantus 20 units.  Agree with decreased dose per MD.   Thanks, Beryl Meager, RN, BC-ADM Inpatient Diabetes Coordinator Pager [phone] (8a-5p)

## 2016-08-09 NOTE — Progress Notes (Signed)
Pt had 27 Beats VTach around 0425 this am.  Pt sleeping, asymptomatic.  BP=109/69.  Order for PO Potassium and 2gm Mag IV from On call MD (IM Floor Coverage) received; K=3.4 and Mag=1.7 today.

## 2016-08-09 NOTE — Progress Notes (Signed)
1400 Pt sleeping soundly. Talked with pt's RN. Pt has walked with PT and has been active this morning. Will let sleep and follow up tomorrow. Luetta Nutting RN BSN 08/09/2016 1:57 PM

## 2016-08-10 LAB — CBC
HCT: 33.5 % — ABNORMAL LOW (ref 39.0–52.0)
HEMOGLOBIN: 11.7 g/dL — AB (ref 13.0–17.0)
MCH: 29 pg (ref 26.0–34.0)
MCHC: 34.9 g/dL (ref 30.0–36.0)
MCV: 83.1 fL (ref 78.0–100.0)
PLATELETS: 189 10*3/uL (ref 150–400)
RBC: 4.03 MIL/uL — AB (ref 4.22–5.81)
RDW: 11.7 % (ref 11.5–15.5)
WBC: 5 10*3/uL (ref 4.0–10.5)

## 2016-08-10 LAB — GLUCOSE, CAPILLARY
GLUCOSE-CAPILLARY: 125 mg/dL — AB (ref 65–99)
GLUCOSE-CAPILLARY: 92 mg/dL (ref 65–99)
GLUCOSE-CAPILLARY: 89 mg/dL (ref 65–99)
Glucose-Capillary: 116 mg/dL — ABNORMAL HIGH (ref 65–99)
Glucose-Capillary: 140 mg/dL — ABNORMAL HIGH (ref 65–99)

## 2016-08-10 LAB — COMPREHENSIVE METABOLIC PANEL
ALT: 112 U/L — ABNORMAL HIGH (ref 17–63)
ANION GAP: 7 (ref 5–15)
AST: 92 U/L — ABNORMAL HIGH (ref 15–41)
Albumin: 2.4 g/dL — ABNORMAL LOW (ref 3.5–5.0)
Alkaline Phosphatase: 103 U/L (ref 38–126)
BUN: 18 mg/dL (ref 6–20)
CALCIUM: 7.9 mg/dL — AB (ref 8.9–10.3)
CHLORIDE: 94 mmol/L — AB (ref 101–111)
CO2: 25 mmol/L (ref 22–32)
CREATININE: 1.2 mg/dL (ref 0.61–1.24)
Glucose, Bld: 123 mg/dL — ABNORMAL HIGH (ref 65–99)
Potassium: 4 mmol/L (ref 3.5–5.1)
SODIUM: 126 mmol/L — AB (ref 135–145)
Total Bilirubin: 1.4 mg/dL — ABNORMAL HIGH (ref 0.3–1.2)
Total Protein: 5.7 g/dL — ABNORMAL LOW (ref 6.5–8.1)

## 2016-08-10 LAB — COOXEMETRY PANEL
Carboxyhemoglobin: 1.4 % (ref 0.5–1.5)
METHEMOGLOBIN: 1.2 % (ref 0.0–1.5)
O2 Saturation: 57.3 %
TOTAL HEMOGLOBIN: 11.9 g/dL — AB (ref 12.0–16.0)

## 2016-08-10 LAB — MAGNESIUM: MAGNESIUM: 1.8 mg/dL (ref 1.7–2.4)

## 2016-08-10 MED ORDER — ISOSORBIDE MONONITRATE ER 30 MG PO TB24
30.0000 mg | ORAL_TABLET | Freq: Every day | ORAL | Status: DC
  Administered 2016-08-10 – 2016-08-13 (×4): 30 mg via ORAL
  Filled 2016-08-10 (×4): qty 1

## 2016-08-10 MED ORDER — HYDRALAZINE HCL 25 MG PO TABS
25.0000 mg | ORAL_TABLET | Freq: Three times a day (TID) | ORAL | Status: DC
  Administered 2016-08-10 – 2016-08-13 (×9): 25 mg via ORAL
  Filled 2016-08-10 (×10): qty 1

## 2016-08-10 MED ORDER — LORAZEPAM 0.5 MG PO TABS
0.5000 mg | ORAL_TABLET | Freq: Four times a day (QID) | ORAL | Status: DC | PRN
  Administered 2016-08-11: 0.5 mg via ORAL
  Filled 2016-08-10: qty 1

## 2016-08-10 NOTE — Progress Notes (Signed)
CARDIAC REHAB PHASE I   PRE:  Rate/Rhythm: 81 SR    BP: sitting 133/84    SaO2: 96 RA  MODE:  Ambulation: to door   POST:  Rate/Rhythm: 88 SR    BP: sitting 144/90     SaO2: 93 RA  Pt very unsteady and weak legged. Struggled to stand upright, esp with distance. Pt mumbles. Tired after walk. He will be a low priority for CR.  [phone]   Harriet Masson CES, ACSM 08/10/2016 3:19 PM

## 2016-08-10 NOTE — Progress Notes (Signed)
Advanced Heart Failure Rounding Note   Subjective:    Yesterday milrinone continued and hydralazine/imdur added. CVP low. CO-OX 57%. LFTs and renal function trending down.   Denies SOB/CP.   Objective:   Weight Range:  Vital Signs:   Temp:  [97.5 F (36.4 C)-98.4 F (36.9 C)] 97.8 F (36.6 C) (04/18 0422) Pulse Rate:  [72-82] 80 (04/18 0422) Resp:  [18] 18 (04/17 0750) BP: (114-140)/(66-86) 140/86 (04/18 0524) SpO2:  [94 %-98 %] 95 % (04/18 0422) Weight:  [170 lb 8 oz (77.3 kg)] 170 lb 8 oz (77.3 kg) (04/18 0422) Last BM Date: 08/09/16 (per patient)  Weight change: Filed Weights   08/08/16 0604 08/09/16 0300 08/10/16 0422  Weight: 173 lb 8 oz (78.7 kg) 180 lb 14.4 oz (82.1 kg) 170 lb 8 oz (77.3 kg)    Intake/Output:   Intake/Output Summary (Last 24 hours) at 08/10/16 0732 Last data filed at 08/10/16 0600  Gross per 24 hour  Intake            655.7 ml  Output              650 ml  Net              5.7 ml     Physical Exam: CVP 4-5  General:  Elderly chronically ill appearing.  No resp difficulty. Drowsy  HEENT: normal Neck: supple. no JVD. Carotids 2+ bilat; no bruits. No lymphadenopathy or thryomegaly appreciated. Cor: PMI nondisplaced. Regular rate & rhythm. No rubs, gallops or murmurs. Lungs: clear Abdomen: soft, nontender, nondistended. No hepatosplenomegaly. No bruits or masses. Good bowel sounds. Extremities: no cyanosis, clubbing, rash, edema. RUE PICC  Neuro: alert & orientedx3, cranial nerves grossly intact. moves all 4 extremities w/o difficulty. Affect pleasant  Telemetry: NSR 70-80s.    Labs: Basic Metabolic Panel:  Recent Labs Lab 08/07/16 0918 08/07/16 1833 08/08/16 0332 08/09/16 0500 08/10/16 0445  NA 119* 118* 120* 124* 126*  K 5.3* 4.4 4.3 3.4* 4.0  CL 83* 84* 88* 90* 94*  CO2 21* 22 20* 24 25  GLUCOSE 103* 120* 67 94 123*  BUN 32* 40* 36* 29* 18  CREATININE 2.72* 2.68* 2.33* 1.55* 1.20  CALCIUM 8.4* 8.0* 8.3* 7.7* 7.9*  MG   --   --   --  1.7 1.8    Liver Function Tests:  Recent Labs Lab 08/07/16 0555 08/07/16 0918 08/08/16 0332 08/09/16 0500 08/10/16 0445  AST 217* 254* 482* 172* 92*  ALT 97* 110* 209* 143* 112*  ALKPHOS 102 101 109 87 103  BILITOT 1.9* 1.8* 1.1 1.3* 1.4*  PROT 6.2* 6.2* 6.3* 5.2* 5.7*  ALBUMIN 2.6* 2.8* 2.7* 2.3* 2.4*   No results for input(s): LIPASE, AMYLASE in the last 168 hours.  Recent Labs Lab 08/08/16 0955  AMMONIA 25    CBC:  Recent Labs Lab 08/05/16 1921 08/06/16 0103 08/07/16 0555 08/07/16 0918 08/08/16 0332 08/10/16 0445  WBC 5.3 5.6 7.5 7.1 6.6 5.0  NEUTROABS 3.1 3.8  --   --   --   --   HGB 12.6* 12.6* 13.1 12.9* 13.1 11.7*  HCT 35.5* 35.0* 36.5* 35.6* 36.4* 33.5*  MCV 82.9 82.9 82.4 82.2 82.9 83.1  PLT 213 199 206 204 189 189    Cardiac Enzymes: No results for input(s): CKTOTAL, CKMB, CKMBINDEX, TROPONINI in the last 168 hours.  BNP: BNP (last 3 results)  Recent Labs  09/14/15 0518 04/10/16 2203 08/05/16 1922  BNP 862.7* 1,828.2* 2,465.1*  ProBNP (last 3 results) No results for input(s): PROBNP in the last 8760 hours.    Other results:  Imaging: US Abdomen Complete  Result Date: 08/08/2016 CLINICAL DATA:  Elevated LFTs.  Initial encounter. EXAM: ABDOMEN ULTRASOUND COMPLETE COMPARISON:  Abdominal ultrasound performed 02/21/2014, and CT of the abdomen and pelvis performed 10/23/2008 FINDINGS: Gallbladder: No gallstones or wall thickening visualized. No sonographic Murphy sign noted by sonographer. Common bile duct: Diameter: 0.2 cm, within normal limits in caliber. Liver: No focal lesion identified. Within normal limits in parenchymal echogenicity. IVC: No abnormality visualized. Pancreas: Visualized portion unremarkable. Spleen: Size and appearance within normal limits. Right Kidney: Length: 13.2 cm. Echogenicity within normal limits. No mass or hydronephrosis visualized. Left Kidney: Length: 12.2 cm. Echogenicity within normal limits.  Left-sided renal pelvicaliectasis is stable from prior studies. No significant hydronephrosis or mass visualized. Abdominal aorta: No aneurysm visualized. There is obscuration of the mid abdominal aorta and the common iliac arteries overlying structures. Other findings: Small bilateral pleural effusions are seen. IMPRESSION: 1. No acute abnormality seen to explain the patient's symptoms. 2. Small bilateral pleural effusions seen. Electronically Signed   By: Roanna Raider M.D.   On: 08/08/2016 20:51     Medications:     Scheduled Medications: . digoxin  0.125 mg Oral Daily  . enoxaparin (LOVENOX) injection  40 mg Subcutaneous Daily  . folic acid  1 mg Oral Daily  . hydrALAZINE  12.5 mg Oral Q8H  . insulin aspart  0-9 Units Subcutaneous TID WC  . insulin glargine  12 Units Subcutaneous Daily  . isosorbide mononitrate  15 mg Oral Daily  . LORazepam  0-4 mg Intravenous Q12H  . multivitamin with minerals  1 tablet Oral Daily  . pravastatin  10 mg Oral Daily  . sertraline  100 mg Oral QHS  . sodium chloride flush  10-40 mL Intracatheter Q12H  . sodium chloride flush  3 mL Intravenous Q12H  . thiamine  100 mg Oral Daily  . traZODone  150 mg Oral QHS    Infusions: . sodium chloride    . milrinone 0.25 mcg/kg/min (08/09/16 2140)    PRN Medications: sodium chloride, acetaminophen, ondansetron (ZOFRAN) IV, sodium chloride flush, sodium chloride flush   Assessment/Plan/Discussion  1. Cardiogenic Shock  Initial CO-OX 40%. Milrinone started 0.25 mcg. Todays CO-OX is 57%. Start to wean milrinone tomorrow.  Plan to continue milrinone until LFTs/renal comes down.  2. A/C Combined Systolic/Diastolic Heart Failure ECHO 08/07/2016 EF 10-15%. Peak PA pressure 69 mm hg RV mildly dilated.  No bb with low output and cocaine abuse.   Volume status stable. Hold off on lasix.  No Arb, spiro with AKI.  He is on digoxin 0.125 Increased hydralazine to 25 mg tid and imdur to 30 mg daily.  Would not use  spiro with noncompliance. .  2. Hyponatremia Sodium on admit 118. Todays sodium 126. Restrict free water.  3. AKI- Creatinine on admit 1.1.  Creatinine trending down from 2.3>1.55>1.2. Improving. .   Renal US on 4/15 without evidence of hydronephrosis 4. Elevated LFTs Suspect in the setting of cardiogenic shock.  LFTs trending down on milrinone.   [place] no abnormalities.  He is not on a statin.  5. CAD- non obstructive on LHC 2014. No statin with elevated LFTs. No CP.  6. ETOH abuse- Drinks multiple beers daily. May need rehab.  7. Substance Abuse- + Cocaine on admit.  8. Tobacco Abuse- Counseled on smoking cessation.  9. Hypomagnesium- Mag 1.8. Give 4 grams  mag today.  10 Deconditioning- PT recommending SNF.   He is not candidate for advanced therapies or home inotropes with substance abuse. Suspect he will be hard to manage given poor insight. Will refer to HF Paramedicine.   Needs Palliative Care consult.    Length of Stay: 5   Amy Clegg NP-C  08/10/2016, 7:32 AM  Advanced Heart Failure Team Pager [phone] (M-F; 7a - 4p)  Please contact [facility] for night-coverage after hours (4p -7a ) and weekends on [email or web address]  Patient seen with NP, agree with the above note.  1. Acute on chronic systolic CHF: Echo with EF 10-15%. Cath 2014 with nonobstructive CAD. 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 Admitted with dyspnea, initially given Lasix but has had significant rise in creatinine and LFTs. Concerning for low output state => confirmed by low co-ox.  Now on milrinone with co-ox up to 57% today and falling creatinine. CVP is not elevated.   - Not candidate for advanced therapies with active substance abuse.  - Continue milrinone 0.25 as LFTs come down.  Creatinine back to baseline.  He will not be a home milrinone candidate with substance abuse.  Will start titrating po meds to get him off the milrinone.  - Increase hydralazine and Imdur  as above today.  - Continue digoxin 0.125 daily.  - Add ARB tomorrow.  2. AKI: Cardiorenal due to low output.  Creatinine back to baseline with milrinone.  3. ETOH abuse: Heavy drinker, advised to quit.  4. Cocaine: Active use, advised to quit.  5. Elevated LFTs: Concerning that this is due to low cardiac output. NH3 not elevated and abdominal US not suggestive of cirrhosis.  LFTs coming down. 6. Hyponatremia: In setting of low output HF.  Improving, up to 126 today.  - Continue fluid restriction.  - CVP not elevated, would avoid tolvaptan for now as improving with fluid restriction alone.  7. Altered mental status: Mental slowing but seems fairly well oriented today.  Hyponatremia may be playing a role => correcting at appropriate pace.  Does not seem to have ETOH withdrawal.  8. SNF recommended, patient resistant.    Mobilize with PT.   Marca Ancona 08/10/2016 8:47 AM

## 2016-08-10 NOTE — Care Management Note (Addendum)
Case Management Note  Patient Details  Name: LAVALLE KENNA MRN: [id number] Date of Birth: 1955-06-21  Subjective/Objective:  Pt presented for Acute on Chronic CHF. Transfer [facility].  Pt continues on IV Milrinone and plan to wean 08-11-16. PT Recommendations for SNF.                   Action/Plan: CSW will assist with disposition needs and CM will continue to monitor.   Expected Discharge Date:                  Expected Discharge Plan:  Skilled Nursing Facility  In-House Referral:  Clinical Social Work  Discharge planning Services  CM Consult  Post Acute Care Choice:    Choice offered to:     DME Arranged:    DME Agency:     HH Arranged:    HH Agency:     Status of Service:  In process, will continue to follow  If discussed at Long Length of Stay Meetings, dates discussed:    Additional Comments:  Gala Lewandowsky, RN 08/10/2016, 2:52 PM

## 2016-08-10 NOTE — Plan of Care (Signed)
Problem: Education: Goal: Ability to demonstrate managment of disease process will improve Outcome: Not Progressing Unable to teach, confused, no evidence og learning

## 2016-08-10 NOTE — Discharge Instructions (Signed)
Heart Failure °Heart failure means your heart has trouble pumping blood. This makes it hard for your body to work well. Heart failure is usually a long-term (chronic) condition. You must take good care of yourself and follow your doctor's treatment plan. °Follow these instructions at home: °· Take your heart medicine as told by your doctor. °? Do not stop taking medicine unless your doctor tells you to. °? Do not skip any dose of medicine. °? Refill your medicines before they run out. °? Take other medicines only as told by your doctor or pharmacist. °· Stay active if told by your doctor. The elderly and people with severe heart failure should talk with a doctor about physical activity. °· Eat heart-healthy foods. Choose foods that are without trans fat and are low in saturated fat, cholesterol, and salt (sodium). This includes fresh or frozen fruits and vegetables, fish, lean meats, fat-free or low-fat dairy foods, whole grains, and high-fiber foods. Lentils and dried peas and beans (legumes) are also good choices. °· Limit salt if told by your doctor. °· Cook in a healthy way. Roast, grill, broil, bake, poach, steam, or stir-fry foods. °· Limit fluids as told by your doctor. °· Weigh yourself every morning. Do this after you pee (urinate) and before you eat breakfast. Write down your weight to give to your doctor. °· Take your blood pressure and write it down if your doctor tells you to. °· Ask your doctor how to check your pulse. Check your pulse as told. °· Lose weight if told by your doctor. °· Stop smoking or chewing tobacco. Do not use gum or patches that help you quit without your doctor's approval. °· Schedule and go to doctor visits as told. °· Nonpregnant women should have no more than 1 drink a day. Men should have no more than 2 drinks a day. Talk to your doctor about drinking alcohol. °· Stop illegal drug use. °· Stay current with shots (immunizations). °· Manage your health conditions as told by your  doctor. °· Learn to manage your stress. °· Rest when you are tired. °· If it is really hot outside: °? Avoid intense activities. °? Use air conditioning or fans, or get in a cooler place. °? Avoid caffeine and alcohol. °? Wear loose-fitting, lightweight, and light-colored clothing. °· If it is really cold outside: °? Avoid intense activities. °? Layer your clothing. °? Wear mittens or gloves, a hat, and a scarf when going outside. °? Avoid alcohol. °· Learn about heart failure and get support as needed. °· Get help to maintain or improve your quality of life and your ability to care for yourself as needed. °Contact a doctor if: °· You gain weight quickly. °· You are more short of breath than usual. °· You cannot do your normal activities. °· You tire easily. °· You cough more than normal, especially with activity. °· You have any or more puffiness (swelling) in areas such as your hands, feet, ankles, or belly (abdomen). °· You cannot sleep because it is hard to breathe. °· You feel like your heart is beating fast (palpitations). °· You get dizzy or light-headed when you stand up. °Get help right away if: °· You have trouble breathing. °· There is a change in mental status, such as becoming less alert or not being able to focus. °· You have chest pain or discomfort. °· You faint. °This information is not intended to replace advice given to you by your health care provider. Make sure you   discuss any questions you have with your health care provider. °Document Released: 01/19/2008 Document Revised: 09/17/2015 Document Reviewed: 05/28/2012 °Elsevier Interactive Patient Education © 2017 [employer] ° °

## 2016-08-10 NOTE — Plan of Care (Signed)
Problem: Cardiac: Goal: Ability to achieve and maintain adequate cardiopulmonary perfusion will improve Outcome: Progressing Wt down and CVP improving

## 2016-08-10 NOTE — Progress Notes (Addendum)
PROGRESS NOTE    [name]  ZOX:[id number] DOB: Jun 26, 1955 DOA: 08/05/2016 PCP: [facility]    Brief Narrative:  61 yo male with systolic heart failure, presented with dyspnea and orthopnea. Worsening symptoms for 48 to 72 hours before admission. Ongoing alcohol and cocaine use. On the initial physical examination patient had tachypnea 27 bpm, with blood pressure 124 systolic. Positive JVD and increase work of breathing with decrease breath sounds. Sodium 118. Patient was admitted to the hospital with the working diagnosis of decompensated heart failure complicated by hyponatremia. Patient has required inotropic support with milrinone.    Assessment & Plan:   Principal Problem:   Acute on chronic combined systolic and diastolic CHF (congestive heart failure) (HCC) Active Problems:   Hypertension   History of substance abuse   Non Obstructive CAD (coronary artery disease)   Hyperkalemia   Hyponatremia   Polysubstance abuse   Type 2 diabetes mellitus with hyperglycemia, with long-term current use of insulin (HCC)   Depression with anxiety   ARF (acute renal failure) (HCC)   Elevated LFTs  1. Acute on chronic systolic heart failure decompensation. Will continue milrinone and digoxin for inotropic support, afterload reducing agents with hydralazine and isosorbide, urine output 650 over last 24 hours.   2. Hyponatremia and Hyperkalemia. Na at 126 and K ar 4,0, will continue heart failure treatment, will follow on renal panel in am.   3. AKI. Renal function with Cr trending down, today at 1,2 from 1,5, K at 4,0, will follow on renal panel in am, avoid hypotension or nephrotoxic medications.   4. T2DM. Will continue glucose cover and monitoring with iss, capillary glucose 125, 116, 140.   5. Depression. Will decrease benzodiazepine dose to prevent encephalopathy. Continue trazodone and sertraline.   6. Hypertension.  Will continue blood pressure control with isosorbide and  hydralazine.    DVT prophylaxis: enoxaparin  Code Status: full  Family Communication: no family at the bedside  Disposition Plan: home    Consultants:   Heart failure team.  Procedures:     Antimicrobials:    Subjective: Patient feeling tired and fatigue, no chest pain, no dyspnea, no nausea or vomiting. Continue on milrinone infusion per picc line.   Objective: Vitals:   08/10/16 0406 08/10/16 0422 08/10/16 0524 08/10/16 0740  BP: 133/76  140/86   Pulse:  80  78  Resp:    15  Temp:  97.8 F (36.6 C)  98 F (36.7 C)  TempSrc:  Oral  Oral  SpO2:  95%  97%  Weight:  77.3 kg (170 lb 8 oz)    Height:        Intake/Output Summary (Last 24 hours) at 08/10/16 1024 Last data filed at 08/10/16 0900  Gross per 24 hour  Intake            595.7 ml  Output              400 ml  Net            195.7 ml   Filed Weights   08/08/16 0604 08/09/16 0300 08/10/16 0422  Weight: 78.7 kg (173 lb 8 oz) 82.1 kg (180 lb 14.4 oz) 77.3 kg (170 lb 8 oz)    Examination:  General exam: deconditioned E ENT. Mild pallor, oral mucosa dry.   Respiratory system: Clear to auscultation. Respiratory effort normal. No wheezing, rales or rhonchi.  Cardiovascular system: S1 & S2 heard, RRR. No JVD, murmurs, rubs, gallops or  clicks. No pedal edema. Gastrointestinal system: Abdomen is nondistended, soft and nontender. No organomegaly or masses felt. Normal bowel sounds heard. Central nervous system: Somnolent, no focal neurological deficits. Extremities: Symmetric 5 x 5 power. Skin: No rashes, lesions or ulcers     Data Reviewed: I have personally reviewed following labs and imaging studies  CBC:  Recent Labs Lab 08/05/16 1921 08/06/16 0103 08/07/16 0555 08/07/16 0918 08/08/16 0332 08/10/16 0445  WBC 5.3 5.6 7.5 7.1 6.6 5.0  NEUTROABS 3.1 3.8  --   --   --   --   HGB 12.6* 12.6* 13.1 12.9* 13.1 11.7*  HCT 35.5* 35.0* 36.5* 35.6* 36.4* 33.5*  MCV 82.9 82.9 82.4 82.2 82.9 83.1  PLT  213 199 206 204 189 189   Basic Metabolic Panel:  Recent Labs Lab 08/07/16 0918 08/07/16 1833 08/08/16 0332 08/09/16 0500 08/10/16 0445  NA 119* 118* 120* 124* 126*  K 5.3* 4.4 4.3 3.4* 4.0  CL 83* 84* 88* 90* 94*  CO2 21* 22 20* 24 25  GLUCOSE 103* 120* 67 94 123*  BUN 32* 40* 36* 29* 18  CREATININE 2.72* 2.68* 2.33* 1.55* 1.20  CALCIUM 8.4* 8.0* 8.3* 7.7* 7.9*  MG  --   --   --  1.7 1.8   GFR: Estimated Creatinine Clearance: 69.8 mL/min (by C-G formula based on SCr of 1.2 mg/dL). Liver Function Tests:  Recent Labs Lab 08/07/16 0555 08/07/16 0918 08/08/16 0332 08/09/16 0500 08/10/16 0445  AST 217* 254* 482* 172* 92*  ALT 97* 110* 209* 143* 112*  ALKPHOS 102 101 109 87 103  BILITOT 1.9* 1.8* 1.1 1.3* 1.4*  PROT 6.2* 6.2* 6.3* 5.2* 5.7*  ALBUMIN 2.6* 2.8* 2.7* 2.3* 2.4*   No results for input(s): LIPASE, AMYLASE in the last 168 hours.  Recent Labs Lab 08/08/16 0955  AMMONIA 25   Coagulation Profile: No results for input(s): INR, PROTIME in the last 168 hours. Cardiac Enzymes: No results for input(s): CKTOTAL, CKMB, CKMBINDEX, TROPONINI in the last 168 hours. BNP (last 3 results) No results for input(s): PROBNP in the last 8760 hours. HbA1C: No results for input(s): HGBA1C in the last 72 hours. CBG:  Recent Labs Lab 08/09/16 1112 08/09/16 1656 08/09/16 2041 08/10/16 0549 08/10/16 0725  GLUCAP 199* 189* 155* 125* 116*   Lipid Profile: No results for input(s): CHOL, HDL, LDLCALC, TRIG, CHOLHDL, LDLDIRECT in the last 72 hours. Thyroid Function Tests: No results for input(s): TSH, T4TOTAL, FREET4, T3FREE, THYROIDAB in the last 72 hours. Anemia Panel: No results for input(s): VITAMINB12, FOLATE, FERRITIN, TIBC, IRON, RETICCTPCT in the last 72 hours. Sepsis Labs: No results for input(s): PROCALCITON, LATICACIDVEN in the last 168 hours.  No results found for this or any previous visit (from the past 240 hour(s)).       Radiology Studies: US  Abdomen Complete  Result Date: 08/08/2016 CLINICAL DATA:  Elevated LFTs.  Initial encounter. EXAM: ABDOMEN ULTRASOUND COMPLETE COMPARISON:  Abdominal ultrasound performed 02/21/2014, and CT of the abdomen and pelvis performed 10/23/2008 FINDINGS: Gallbladder: No gallstones or wall thickening visualized. No sonographic Murphy sign noted by sonographer. Common bile duct: Diameter: 0.2 cm, within normal limits in caliber. Liver: No focal lesion identified. Within normal limits in parenchymal echogenicity. IVC: No abnormality visualized. Pancreas: Visualized portion unremarkable. Spleen: Size and appearance within normal limits. Right Kidney: Length: 13.2 cm. Echogenicity within normal limits. No mass or hydronephrosis visualized. Left Kidney: Length: 12.2 cm. Echogenicity within normal limits. Left-sided renal pelvicaliectasis is stable from prior  studies. No significant hydronephrosis or mass visualized. Abdominal aorta: No aneurysm visualized. There is obscuration of the mid abdominal aorta and the common iliac arteries overlying structures. Other findings: Small bilateral pleural effusions are seen. IMPRESSION: 1. No acute abnormality seen to explain the patient's symptoms. 2. Small bilateral pleural effusions seen. Electronically Signed   By: Roanna Raider M.D.   On: 08/08/2016 20:51        Scheduled Meds: . digoxin  0.125 mg Oral Daily  . enoxaparin (LOVENOX) injection  40 mg Subcutaneous Daily  . folic acid  1 mg Oral Daily  . hydrALAZINE  25 mg Oral Q8H  . insulin aspart  0-9 Units Subcutaneous TID WC  . insulin glargine  12 Units Subcutaneous Daily  . isosorbide mononitrate  30 mg Oral Daily  . LORazepam  0-4 mg Intravenous Q12H  . multivitamin with minerals  1 tablet Oral Daily  . pravastatin  10 mg Oral Daily  . sertraline  100 mg Oral QHS  . sodium chloride flush  10-40 mL Intracatheter Q12H  . sodium chloride flush  3 mL Intravenous Q12H  . thiamine  100 mg Oral Daily  . traZODone   150 mg Oral QHS   Continuous Infusions: . sodium chloride    . milrinone 0.25 mcg/kg/min (08/09/16 2140)     LOS: 5 days      Mauricio Annett Gula, MD [facility] Pager [phone]  If 7PM-7AM, please contact night-coverage [email or web address] Password [facility] 08/10/2016, 10:24 AM

## 2016-08-11 LAB — COMPREHENSIVE METABOLIC PANEL
ALBUMIN: 2.3 g/dL — AB (ref 3.5–5.0)
ALT: 83 U/L — ABNORMAL HIGH (ref 17–63)
ANION GAP: 9 (ref 5–15)
AST: 52 U/L — ABNORMAL HIGH (ref 15–41)
Alkaline Phosphatase: 101 U/L (ref 38–126)
BILIRUBIN TOTAL: 1.8 mg/dL — AB (ref 0.3–1.2)
BUN: 12 mg/dL (ref 6–20)
CO2: 22 mmol/L (ref 22–32)
Calcium: 7.8 mg/dL — ABNORMAL LOW (ref 8.9–10.3)
Chloride: 95 mmol/L — ABNORMAL LOW (ref 101–111)
Creatinine, Ser: 0.94 mg/dL (ref 0.61–1.24)
GFR calc non Af Amer: >60 mL/min (ref >60)
Glucose, Bld: 88 mg/dL (ref 65–99)
POTASSIUM: 3.9 mmol/L (ref 3.5–5.1)
SODIUM: 126 mmol/L — AB (ref 135–145)
TOTAL PROTEIN: 5.6 g/dL — AB (ref 6.5–8.1)

## 2016-08-11 LAB — GLUCOSE, CAPILLARY
GLUCOSE-CAPILLARY: 87 mg/dL (ref 65–99)
GLUCOSE-CAPILLARY: 219 mg/dL — AB (ref 65–99)
Glucose-Capillary: 88 mg/dL (ref 65–99)
Glucose-Capillary: 149 mg/dL — ABNORMAL HIGH (ref 65–99)
Glucose-Capillary: 91 mg/dL (ref 65–99)

## 2016-08-11 LAB — COOXEMETRY PANEL
CARBOXYHEMOGLOBIN: 2 % — AB (ref 0.5–1.5)
Methemoglobin: 0.6 % (ref 0.0–1.5)
O2 SAT: 59.9 %
TOTAL HEMOGLOBIN: 13 g/dL (ref 12.0–16.0)

## 2016-08-11 MED ORDER — LOSARTAN POTASSIUM 25 MG PO TABS
25.0000 mg | ORAL_TABLET | Freq: Every day | ORAL | Status: DC
  Administered 2016-08-11: 25 mg via ORAL
  Filled 2016-08-11: qty 1

## 2016-08-11 NOTE — Care Management Important Message (Signed)
Important Message  Patient Details  Name: [name] MRN: [id number] Date of Birth: 1955-06-16   Medicare Important Message Given:  Yes    Kyla Balzarine 08/11/2016, 12:24 PM

## 2016-08-11 NOTE — NC FL2 (Addendum)
[place] MEDICAID FL2 LEVEL OF CARE SCREENING TOOL     IDENTIFICATION  Patient Name: [name] Birthdate: 02-Aug-1955 Sex: male Admission Date (Current Location): 08/05/2016  [facility] and [place] Number:  Producer, television/film/video and Address:  [facility], [address] [place], [place] [address]      Provider Number: [id number]  Attending Physician Name and Address:  Maxie Barb, MD  Relative Name and Phone Number:       Current Level of Care: Hospital Recommended Level of Care: Skilled Nursing Facility Prior Approval Number:    Date Approved/Denied:   [name] Number:   [id number] Discharge Plan:      Current Diagnoses: Patient Active Problem List   Diagnosis Date Noted  . Elevated LFTs   . ARF (acute renal failure) (HCC)   . Depression with anxiety 08/05/2016  . Malnutrition of moderate degree 04/12/2016  . Type 2 diabetes mellitus with hyperglycemia, with long-term current use of insulin (HCC) 04/12/2016  . Cough   . DNR (do not resuscitate) discussion   . Palliative care encounter   . Polysubstance abuse   . Pleural effusion   . Insulin dependent diabetes mellitus (HCC)   . Hepatitis, viral   . Pleural effusion on right   . CHF (congestive heart failure) (HCC) 09/07/2015  . Acute on chronic combined systolic and diastolic CHF (congestive heart failure) (HCC) 09/07/2015  . Elevated troponin I level 03/10/2015  . Alcohol withdrawal (HCC)   . Type 2 diabetes mellitus with complication (HCC)   . 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 Acute respiratory failure with hypoxia (HCC)   . Alcohol abuse with intoxication (HCC)   . Essential hypertension   . Acute on chronic systolic CHF (congestive heart failure) (HCC)   . 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 Alcohol dependence with withdrawal delirium (HCC) 02/23/2014  . Hyponatremia 02/23/2014  . Transaminitis 02/22/2014  . Protein-calorie malnutrition (HCC)  02/22/2014  . Other pancytopenia (HCC) 02/22/2014  . Hyperkalemia 02/22/2014  . Hypomagnesemia 02/22/2014  . Alcoholic ketoacidosis 02/20/2014  . Acute respiratory failure (HCC) 02/20/2014  . Systolic and diastolic CHF, acute on chronic (HCC) 02/20/2014  . ETOH abuse 12/21/2012  . Cocaine dependence (HCC) 12/21/2012  . Substance induced mood disorder (HCC) 12/21/2012  . Non Obstructive CAD (coronary artery disease) 08/14/2012  . Cardiomyopathy, nonischemic (HCC) 08/14/2012  . Systolic and diastolic CHF, acute (HCC) 08/14/2012  . Shortness of breath 08/11/2012  . Diabetes mellitus, type II (HCC) 08/11/2012  . Hypertension 08/11/2012  . History of substance abuse 08/11/2012  . Orthopnea 08/11/2012    Orientation RESPIRATION BLADDER Height & Weight     Time, Situation, Place, Self  Normal Continent Weight: 169 lb 12.8 oz (77 kg) Height:  6\' 3"  (190.5 cm)  BEHAVIORAL SYMPTOMS/MOOD NEUROLOGICAL BOWEL NUTRITION STATUS      Continent Diet (cardiac, carb modified)  AMBULATORY STATUS COMMUNICATION OF NEEDS Skin   Extensive Assist (min-mod assist ) Verbally Normal                       Personal Care Assistance Level of Assistance  Bathing, Feeding, Dressing Bathing Assistance: Limited assistance Feeding assistance: Independent Dressing Assistance: Limited assistance     Functional Limitations Info  Sight, Hearing, Speech Sight Info: Adequate Hearing Info: Adequate Speech Info: Impaired    SPECIAL CARE FACTORS FREQUENCY  PT (By licensed PT), OT (By licensed OT)     PT Frequency: 5x week  OT Frequency: 5x week            Contractures Contractures Info: Not present    Additional Factors Info  Code Status Code Status Info: Full Code             Current Medications (08/11/2016):  This is the current hospital active medication list Current Facility-Administered Medications  Medication Dose Route Frequency Provider Last Rate Last Dose  . 0.9 %  sodium chloride  infusion  250 mL Intravenous PRN Briscoe Deutscher, MD      . acetaminophen (TYLENOL) tablet 650 mg  650 mg Oral Q4H PRN Briscoe Deutscher, MD   650 mg at 08/06/16 0117  . digoxin (LANOXIN) tablet 0.125 mg  0.125 mg Oral Daily Laurey Morale, MD   0.125 mg at 08/11/16 1007  . enoxaparin (LOVENOX) injection 40 mg  40 mg Subcutaneous Daily Briscoe Deutscher, MD   40 mg at 08/11/16 1007  . folic acid (FOLVITE) tablet 1 mg  1 mg Oral Daily Briscoe Deutscher, MD   1 mg at 08/11/16 1007  . hydrALAZINE (APRESOLINE) tablet 25 mg  25 mg Oral Q8H Amy D Clegg, NP   25 mg at 08/11/16 0600  . insulin aspart (novoLOG) injection 0-9 Units  0-9 Units Subcutaneous TID WC Clanford Cyndie Mull, MD   1 Units at 08/10/16 1338  . insulin glargine (LANTUS) injection 12 Units  12 Units Subcutaneous Daily Clanford Cyndie Mull, MD   12 Units at 08/11/16 1007  . isosorbide mononitrate (IMDUR) 24 hr tablet 30 mg  30 mg Oral Daily Amy D Clegg, NP   30 mg at 08/11/16 1018  . LORazepam (ATIVAN) tablet 0.5 mg  0.5 mg Oral Q6H PRN Coralie Keens, MD      . losartan (COZAAR) tablet 25 mg  25 mg Oral Daily Amy D Clegg, NP   25 mg at 08/11/16 1007  . milrinone (PRIMACOR) 20 MG/100 ML (0.2 mg/mL) infusion  0.125 mcg/kg/min Intravenous Continuous Amy D Clegg, NP 3 mL/hr at 08/11/16 0800 0.125 mcg/kg/min at 08/11/16 0800  . multivitamin with minerals tablet 1 tablet  1 tablet Oral Daily Briscoe Deutscher, MD   1 tablet at 08/11/16 1008  . ondansetron (ZOFRAN) injection 4 mg  4 mg Intravenous Q6H PRN Lavone Neri Opyd, MD      . pravastatin (PRAVACHOL) tablet 10 mg  10 mg Oral Daily Briscoe Deutscher, MD   10 mg at 08/11/16 1008  . sertraline (ZOLOFT) tablet 100 mg  100 mg Oral QHS Briscoe Deutscher, MD   100 mg at 08/10/16 2211  . sodium chloride flush (NS) 0.9 % injection 10-40 mL  10-40 mL Intracatheter Q12H Clanford L Johnson, MD   10 mL at 08/11/16 1009  . sodium chloride flush (NS) 0.9 % injection 10-40 mL  10-40 mL Intracatheter PRN Clanford L  Johnson, MD      . sodium chloride flush (NS) 0.9 % injection 3 mL  3 mL Intravenous Q12H Briscoe Deutscher, MD   3 mL at 08/09/16 2156  . sodium chloride flush (NS) 0.9 % injection 3 mL  3 mL Intravenous PRN Lavone Neri Opyd, MD      . thiamine (VITAMIN B-1) tablet 100 mg  100 mg Oral Daily Briscoe Deutscher, MD   100 mg at 08/11/16 1018  . traZODone (DESYREL) tablet 150 mg  150 mg Oral QHS Briscoe Deutscher, MD   150 mg at 08/10/16 2211  Discharge Medications: Please see discharge summary for a list of discharge medications.  Relevant Imaging Results:  Relevant Lab Results:   Additional Information SS# [id number]  Burna Sis, LCSW

## 2016-08-11 NOTE — Clinical Social Work Note (Signed)
Clinical Social Work Assessment  Patient Details  Name: [name] MRN: [id number] Date of Birth: Jan 18, 1956  Date of referral:  08/11/16               Reason for consult:  Discharge Planning                Permission sought to share information with:  Family Supports Permission granted to share information::  Yes, Verbal Permission Granted  Name::     [name]  Agency::     Relationship::  mother  Contact Information:  [phone]  Housing/Transportation Living arrangements for the past 2 months:  Single Family Home Source of Information:  Patient Patient Interpreter Needed:  None Criminal Activity/Legal Involvement Pertinent to Current Situation/Hospitalization:  No - Comment as needed Significant Relationships:  None Lives with:  Other (Comment) (pt lives at home with mother) Do you feel safe going back to the place where you live?  Yes Need for family participation in patient care:  No (Coment)  Care giving concerns: Patient lives at home with mother  Social Worker assessment / plan: CSW met patient at bedside to offer support and discuss patient discharge options. Patient stated he is agreeable to discharge to SNF. Patient stated he is also agreeable to receive resources on outpatient substance abuse resources. CSW to complete necessary paperwork and initiate SNF search on patient behalf.  CSW to follow up with patient once bed offers are available. CSW remains available for support and to facilitate patient discharge needs once medically stable.  Employment status:  Retired Forensic scientist:    PT Recommendations:  [place] / Referral to community resources:  [place]  Patient/Family's Response to care:  Patient verbalized appreciation and understanding for CSW role and involvement in care. Patient agreeable with current discharge plan to SNF  Patient/Family's Understanding of and Emotional Response to Diagnosis,  Current Treatment, and Prognosis:  Patient with good understanding of current medical state and limitations around most recent hospitalization  Emotional Assessment Appearance:  Appears stated age Attitude/Demeanor/Rapport:  Other Affect (typically observed):  Pleasant Orientation:  Oriented to  Time, Oriented to Situation, Oriented to Place, Oriented to Self Alcohol / Substance use:  Illicit Drugs Psych involvement (Current and /or in the community):  No (Comment)  Discharge Needs  Concerns to be addressed:  No discharge needs identified, Substance Abuse Concerns Readmission within the last 30 days:  No Current discharge risk:  None Barriers to Discharge:  No Barriers Identified   Wende Neighbors, LCSW 08/11/2016, 11:21 AM

## 2016-08-11 NOTE — Progress Notes (Signed)
PT Cancellation Note  Patient Details Name: [name] MRN: [id number] DOB: Aug 28, 1955   Cancelled Treatment:    Reason Eval/Treat Not Completed: Fatigue/lethargy limiting ability to participate (remains to refused despite education and encouragement.  Pt reports he will be ready to participate tomorrow.  )   Florestine Avers 08/11/2016, 4:20 PM Joycelyn Rua, PTA pager [phone]

## 2016-08-11 NOTE — Progress Notes (Signed)
Advanced Heart Failure Rounding Note   Subjective:    Yesterday hydralazine/imdur increased. Creatinine and LFTs trending down.   Denies SOB.    Objective:   Weight Range:  Vital Signs:   Temp:  [97.4 F (36.3 C)-98.3 F (36.8 C)] 97.4 F (36.3 C) (04/19 0339) Pulse Rate:  [73-91] 84 (04/19 0339) Resp:  [12-18] 18 (04/18 1633) BP: (109-150)/(76-95) 134/77 (04/19 0339) SpO2:  [89 %-97 %] 89 % (04/19 0339) Weight:  [169 lb 12.8 oz (77 kg)] 169 lb 12.8 oz (77 kg) (04/19 0339) Last BM Date: 08/09/16 (per patient)  Weight change: Filed Weights   08/09/16 0300 08/10/16 0422 08/11/16 0339  Weight: 180 lb 14.4 oz (82.1 kg) 170 lb 8 oz (77.3 kg) 169 lb 12.8 oz (77 kg)    Intake/Output:   Intake/Output Summary (Last 24 hours) at 08/11/16 0750 Last data filed at 08/11/16 0000  Gross per 24 hour  Intake            566.2 ml  Output              500 ml  Net             66.2 ml     Physical Exam: CVP  2 General:  Elderly chronically ill appearing.  No resp difficulty. Drowsy  HEENT: normal Neck: supple. JVP flat Carotids 2+ bilat; no bruits. No lymphadenopathy or thryomegaly appreciated. Cor: PMI nondisplaced. RRR No rubs, gallops or murmurs. Lungs: clear Abdomen: soft, nontender, nondistended. No hepatosplenomegaly. No bruits or masses. Good bowel sounds. Extremities: no cyanosis, clubbing, rash, no edema. edema. RUE PICC  Neuro: alert & orientedx3, cranial nerves grossly intact. moves all 4 extremities w/o difficulty. Affect pleasant  Telemetry: NSR 80-90s     Labs: Basic Metabolic Panel:  Recent Labs Lab 08/07/16 1833 08/08/16 0332 08/09/16 0500 08/10/16 0445 08/11/16 0532  NA 118* 120* 124* 126* 126*  K 4.4 4.3 3.4* 4.0 3.9  CL 84* 88* 90* 94* 95*  CO2 22 20* 24 25 22   GLUCOSE 120* 67 94 123* 88  BUN 40* 36* 29* 18 12  CREATININE 2.68* 2.33* 1.55* 1.20 0.94  CALCIUM 8.0* 8.3* 7.7* 7.9* 7.8*  MG  --   --  1.7 1.8  --     Liver Function  Tests:  Recent Labs Lab 08/07/16 0918 08/08/16 0332 08/09/16 0500 08/10/16 0445 08/11/16 0532  AST 254* 482* 172* 92* 52*  ALT 110* 209* 143* 112* 83*  ALKPHOS 101 109 87 103 101  BILITOT 1.8* 1.1 1.3* 1.4* 1.8*  PROT 6.2* 6.3* 5.2* 5.7* 5.6*  ALBUMIN 2.8* 2.7* 2.3* 2.4* 2.3*   No results for input(s): LIPASE, AMYLASE in the last 168 hours.  Recent Labs Lab 08/08/16 0955  AMMONIA 25    CBC:  Recent Labs Lab 08/05/16 1921 08/06/16 0103 08/07/16 0555 08/07/16 0918 08/08/16 0332 08/10/16 0445  WBC 5.3 5.6 7.5 7.1 6.6 5.0  NEUTROABS 3.1 3.8  --   --   --   --   HGB 12.6* 12.6* 13.1 12.9* 13.1 11.7*  HCT 35.5* 35.0* 36.5* 35.6* 36.4* 33.5*  MCV 82.9 82.9 82.4 82.2 82.9 83.1  PLT 213 199 206 204 189 189    Cardiac Enzymes: No results for input(s): CKTOTAL, CKMB, CKMBINDEX, TROPONINI in the last 168 hours.  BNP: BNP (last 3 results)  Recent Labs  09/14/15 0518 04/10/16 2203 08/05/16 1922  BNP 862.7* 1,828.2* 2,465.1*    ProBNP (last 3 results) No results  for input(s): PROBNP in the last 8760 hours.    Other results:  Imaging: No results found.   Medications:     Scheduled Medications: . digoxin  0.125 mg Oral Daily  . enoxaparin (LOVENOX) injection  40 mg Subcutaneous Daily  . folic acid  1 mg Oral Daily  . hydrALAZINE  25 mg Oral Q8H  . insulin aspart  0-9 Units Subcutaneous TID WC  . insulin glargine  12 Units Subcutaneous Daily  . isosorbide mononitrate  30 mg Oral Daily  . multivitamin with minerals  1 tablet Oral Daily  . pravastatin  10 mg Oral Daily  . sertraline  100 mg Oral QHS  . sodium chloride flush  10-40 mL Intracatheter Q12H  . sodium chloride flush  3 mL Intravenous Q12H  . thiamine  100 mg Oral Daily  . traZODone  150 mg Oral QHS    Infusions: . sodium chloride    . milrinone 0.25 mcg/kg/min (08/11/16 0515)    PRN Medications: sodium chloride, acetaminophen, LORazepam, ondansetron (ZOFRAN) IV, sodium chloride  flush, sodium chloride flush   Assessment/Plan/Discussion  1. Cardiogenic Shock  Initial CO-OX 40%. Milrinone started 0.25 mcg.  CO-OX ok.  Cut back milrinone to 0.125 mcg. Renal function normalized. LFTs trending down.  2. A/C Combined Systolic/Diastolic Heart Failure ECHO 08/07/2016 EF 10-15%. Peak PA pressure 69 mm hg RV mildly dilated.  No bb with low output and cocaine abuse.   Volume status stable. No lasix for now.   No Arb, spiro with AKI.  He is on digoxin 0.125 Continue hydralazine 25 mg tid + imdur 30 mg daily Add losartan 25 mg daily.  No entresto with low CVP.  Would not use spiro with noncompliance. .  2. Hyponatremia Sodium on admit 118. Todays sodium is 126.  3. AKI- Creatinine on admit 1.1.  Creatinine trending down from 2.3>1.55>1.2>0.94 Improved.    Renal US on 4/15 without evidence of hydronephrosis 4. Elevated LFTs Suspect in the setting of cardiogenic shock.  LFts continue to trend down.    [place] no abnormalities.  He is not on a statin.  5. CAD- non obstructive on LHC 2014. No statin with elevated LFTs. No CP.   6. ETOH abuse- Drinks multiple beers daily. May need rehab.  7. Substance Abuse- + Cocaine on admit.  8. Tobacco Abuse- Counseled on smoking cessation.  9. Hypomagnesium- Received 4 grams mag 4/19. Check Mag in am.  10 Deconditioning- PT recommending SNF.   He is not candidate for advanced therapies or home inotropes with substance abuse. Suspect he will be hard to manage given poor insight. Will refer to HF Paramedicine.   Needs Palliative Care consult.   Length of Stay: 6  Amy Clegg NP-C  08/11/2016, 7:50 AM  Advanced Heart Failure Team Pager [phone] (M-F; 7a - 4p)   Patient seen with NP, agree with the above note.  CVP not elevated.  Co-ox acceptable.  LFTs and creatinine have down-trended.  - Continue hydralazine/Imdur and digoxin.  - Add losartan 25 mg daily.  - Decrease milrinone to 0.125 mcg/kg/min.  He is not a candidate for milrinone  at home or advanced therapies with active substance abuse.   Marca Ancona 08/11/2016

## 2016-08-11 NOTE — Clinical Social Work Placement (Signed)
   CLINICAL SOCIAL WORK PLACEMENT  NOTE  Date:  08/11/2016  Patient Details  Name: [name] MRN: [id number] Date of Birth: Oct 05, 1955  Clinical Social Work is seeking post-discharge placement for this patient at the Skilled  Nursing Facility level of care (*CSW will initial, date and re-position this form in  chart as items are completed):  Yes   Patient/family provided with [place] Clinical Social Work Department's list of facilities offering this level of care within the geographic area requested by the patient (or if unable, by the patient's family).  Yes   Patient/family informed of their freedom to choose among providers that offer the needed level of care, that participate in Medicare, Medicaid or managed care program needed by the patient, have an available bed and are willing to accept the patient.  Yes   Patient/family informed of [facility]'s ownership interest in [facility] and [facility], as well as of the fact that they are under no obligation to receive care at these facilities.  [name] submitted to EDS on       [name] number received on       Existing [name] number confirmed on       FL2 transmitted to all facilities in geographic area requested by pt/family on       FL2 transmitted to all facilities within larger geographic area on       Patient informed that his/her managed care company has contracts with or will negotiate with certain facilities, including the following:            Patient/family informed of bed offers received.  Patient chooses bed at       Physician recommends and patient chooses bed at      Patient to be transferred to   on  .  Patient to be transferred to facility by       Patient family notified on   of transfer.  Name of family member notified:        PHYSICIAN Please sign FL2     Additional Comment:    _______________________________________________ Althea Charon, LCSW 08/11/2016, 11:27 AM

## 2016-08-11 NOTE — Progress Notes (Signed)
PROGRESS NOTE    [name]  VEH:[id number] DOB: 1955/05/18 DOA: 08/05/2016 PCP: [facility]   Brief Narrative:  61 y.o.malewith medical history significant forchronic combined systolic/diastolic CHF, nonobstructive CAD, depression with anxiety, hypertension, and insulin-dependent diabetes mellitus presents the emergency department with 2-3 days of progressive exertional dyspnea and orthopnea Assessment & Plan:  # Cardiogenic shock:  -Patient is currently on milrinone as per heart failure team. Renal function improving.   # Acute on chronic combined systolic/diastolic congestive heart failure: -Echo with EF of 10-15% -volume status okay, not on diuretics -Continue digoxin, hydralazine, Imdur, losartan  #Hyponatremia: In the setting of CHF: Serum sodium level improving to 126. Monitor BMP.  #Acute kidney injury: Serum creatinine level improved. Renal ultrasound showed no evidence of hydronephrosis.  #Transaminitis: In the setting of cardiogenic shock. Liver enzymes are trending down.  #History of coronary artery disease  #Alcohol abuse: No sign of withdrawal.  #Substance abuse: Urine toxicology positive for cocaine on admission. Education provided to the patient.  #Hypertension: Continue current medication. Monitor blood pressure.  #Type 2 diabetes: Continue current insulin regimen. Monitor blood sugar level. A1c is 9.6.  Principal Problem:   Acute on chronic combined systolic and diastolic CHF (congestive heart failure) (HCC) Active Problems:   Hypertension   History of substance abuse   Non Obstructive CAD (coronary artery disease)   Hyperkalemia   Hyponatremia   Polysubstance abuse   Type 2 diabetes mellitus with hyperglycemia, with long-term current use of insulin (HCC)   Depression with anxiety   ARF (acute renal failure) (HCC)   Elevated LFTs  DVT prophylaxis: Lovenox subcutaneous Code Status: Full code Family Communication: No family present at  bedside Disposition Plan: Likely discharge to rehabilitation in 1-2 days    Consultants:   Cardiology  Procedures:none Antimicrobials:none  Subjective: Patient was seen and examined at bedside. Denied headache, dizziness, nausea, vomiting, chest pain or shortness of breath.  Objective: Vitals:   08/11/16 0339 08/11/16 0815 08/11/16 0900 08/11/16 1118  BP: 134/77 132/80  117/70  Pulse: 84 86 83 81  Resp:  18  18  Temp: 97.4 F (36.3 C) 98.3 F (36.8 C)  98.5 F (36.9 C)  TempSrc: Oral Oral  Oral  SpO2: (!) 89% 90% 92% 94%  Weight: 77 kg (169 lb 12.8 oz)     Height:        Intake/Output Summary (Last 24 hours) at 08/11/16 1418 Last data filed at 08/11/16 1351  Gross per 24 hour  Intake            558.4 ml  Output              400 ml  Net            158.4 ml   Filed Weights   08/09/16 0300 08/10/16 0422 08/11/16 0339  Weight: 82.1 kg (180 lb 14.4 oz) 77.3 kg (170 lb 8 oz) 77 kg (169 lb 12.8 oz)    Examination:  General exam: Appears calm and comfortable  Respiratory system: Clear to auscultation. Respiratory effort normal. No wheezing or crackle Cardiovascular system: S1 & S2 heard, RRR.  No pedal edema. Gastrointestinal system: Abdomen is nondistended, soft and nontender. Normal bowel sounds heard. Central nervous system: Alert and oriented. No focal neurological deficits. Extremities: Symmetric 5 x 5 power. Skin: No rashes, lesions or ulcers Psychiatry: Judgement and insight appear normal. Mood & affect appropriate.     Data Reviewed: I have personally reviewed following labs and imaging  studies  CBC:  Recent Labs Lab 08/05/16 1921 08/06/16 0103 08/07/16 0555 08/07/16 0918 08/08/16 0332 08/10/16 0445  WBC 5.3 5.6 7.5 7.1 6.6 5.0  NEUTROABS 3.1 3.8  --   --   --   --   HGB 12.6* 12.6* 13.1 12.9* 13.1 11.7*  HCT 35.5* 35.0* 36.5* 35.6* 36.4* 33.5*  MCV 82.9 82.9 82.4 82.2 82.9 83.1  PLT 213 199 206 204 189 189   Basic Metabolic Panel:  Recent  Labs Lab 08/07/16 1833 08/08/16 0332 08/09/16 0500 08/10/16 0445 08/11/16 0532  NA 118* 120* 124* 126* 126*  K 4.4 4.3 3.4* 4.0 3.9  CL 84* 88* 90* 94* 95*  CO2 22 20* 24 25 22   GLUCOSE 120* 67 94 123* 88  BUN 40* 36* 29* 18 12  CREATININE 2.68* 2.33* 1.55* 1.20 0.94  CALCIUM 8.0* 8.3* 7.7* 7.9* 7.8*  MG  --   --  1.7 1.8  --    GFR: Estimated Creatinine Clearance: 88.7 mL/min (by C-G formula based on SCr of 0.94 mg/dL). Liver Function Tests:  Recent Labs Lab 08/07/16 0918 08/08/16 0332 08/09/16 0500 08/10/16 0445 08/11/16 0532  AST 254* 482* 172* 92* 52*  ALT 110* 209* 143* 112* 83*  ALKPHOS 101 109 87 103 101  BILITOT 1.8* 1.1 1.3* 1.4* 1.8*  PROT 6.2* 6.3* 5.2* 5.7* 5.6*  ALBUMIN 2.8* 2.7* 2.3* 2.4* 2.3*   No results for input(s): LIPASE, AMYLASE in the last 168 hours.  Recent Labs Lab 08/08/16 0955  AMMONIA 25   Coagulation Profile: No results for input(s): INR, PROTIME in the last 168 hours. Cardiac Enzymes: No results for input(s): CKTOTAL, CKMB, CKMBINDEX, TROPONINI in the last 168 hours. BNP (last 3 results) No results for input(s): PROBNP in the last 8760 hours. HbA1C: No results for input(s): HGBA1C in the last 72 hours. CBG:  Recent Labs Lab 08/10/16 1607 08/10/16 2027 08/11/16 0337 08/11/16 0801 08/11/16 1118  GLUCAP 92 89 88 87 219*   Lipid Profile: No results for input(s): CHOL, HDL, LDLCALC, TRIG, CHOLHDL, LDLDIRECT in the last 72 hours. Thyroid Function Tests: No results for input(s): TSH, T4TOTAL, FREET4, T3FREE, THYROIDAB in the last 72 hours. Anemia Panel: No results for input(s): VITAMINB12, FOLATE, FERRITIN, TIBC, IRON, RETICCTPCT in the last 72 hours. Sepsis Labs: No results for input(s): PROCALCITON, LATICACIDVEN in the last 168 hours.  No results found for this or any previous visit (from the past 240 hour(s)).       Radiology Studies: No results found.      Scheduled Meds: . digoxin  0.125 mg Oral Daily  .  enoxaparin (LOVENOX) injection  40 mg Subcutaneous Daily  . folic acid  1 mg Oral Daily  . hydrALAZINE  25 mg Oral Q8H  . insulin aspart  0-9 Units Subcutaneous TID WC  . insulin glargine  12 Units Subcutaneous Daily  . isosorbide mononitrate  30 mg Oral Daily  . losartan  25 mg Oral Daily  . multivitamin with minerals  1 tablet Oral Daily  . pravastatin  10 mg Oral Daily  . sertraline  100 mg Oral QHS  . sodium chloride flush  10-40 mL Intracatheter Q12H  . sodium chloride flush  3 mL Intravenous Q12H  . thiamine  100 mg Oral Daily  . traZODone  150 mg Oral QHS   Continuous Infusions: . sodium chloride 10 mL (08/11/16 0800)  . milrinone 0.125 mcg/kg/min (08/11/16 0800)     LOS: 6 days    [place]  Jaynie Collins, MD [facility] Pager [phone]  If 7PM-7AM, please contact night-coverage [email or web address] Password [email or web address] 08/11/2016, 2:18 PM

## 2016-08-12 DIAGNOSIS — F411 Generalized anxiety disorder: Secondary | ICD-10-CM

## 2016-08-12 DIAGNOSIS — Z515 Encounter for palliative care: Secondary | ICD-10-CM

## 2016-08-12 DIAGNOSIS — Z7189 Other specified counseling: Secondary | ICD-10-CM

## 2016-08-12 LAB — GLUCOSE, CAPILLARY
GLUCOSE-CAPILLARY: 84 mg/dL (ref 65–99)
GLUCOSE-CAPILLARY: 166 mg/dL — AB (ref 65–99)
Glucose-Capillary: 86 mg/dL (ref 65–99)
Glucose-Capillary: 111 mg/dL — ABNORMAL HIGH (ref 65–99)
Glucose-Capillary: 120 mg/dL — ABNORMAL HIGH (ref 65–99)

## 2016-08-12 LAB — COMPREHENSIVE METABOLIC PANEL
ALT: 67 U/L — ABNORMAL HIGH (ref 17–63)
AST: 45 U/L — ABNORMAL HIGH (ref 15–41)
Albumin: 2.2 g/dL — ABNORMAL LOW (ref 3.5–5.0)
Alkaline Phosphatase: 100 U/L (ref 38–126)
Anion gap: 10 (ref 5–15)
BUN: 10 mg/dL (ref 6–20)
CHLORIDE: 96 mmol/L — AB (ref 101–111)
CO2: 20 mmol/L — ABNORMAL LOW (ref 22–32)
Calcium: 7.7 mg/dL — ABNORMAL LOW (ref 8.9–10.3)
Creatinine, Ser: 0.91 mg/dL (ref 0.61–1.24)
Glucose, Bld: 81 mg/dL (ref 65–99)
Potassium: 3.9 mmol/L (ref 3.5–5.1)
SODIUM: 126 mmol/L — AB (ref 135–145)
Total Bilirubin: 1.9 mg/dL — ABNORMAL HIGH (ref 0.3–1.2)
Total Protein: 5.9 g/dL — ABNORMAL LOW (ref 6.5–8.1)

## 2016-08-12 LAB — COOXEMETRY PANEL
Carboxyhemoglobin: 1.7 % — ABNORMAL HIGH (ref 0.5–1.5)
METHEMOGLOBIN: 0.8 % (ref 0.0–1.5)
O2 Saturation: 72.2 %
Total hemoglobin: 12.6 g/dL (ref 12.0–16.0)

## 2016-08-12 LAB — NA AND K (SODIUM & POTASSIUM), RAND UR
Potassium Urine: 63 mmol/L
SODIUM UR: 119 mmol/L

## 2016-08-12 LAB — OSMOLALITY, URINE: Osmolality, Ur: 595 mOsm/kg (ref 300–900)

## 2016-08-12 MED ORDER — LOSARTAN POTASSIUM 50 MG PO TABS
50.0000 mg | ORAL_TABLET | Freq: Every day | ORAL | Status: DC
  Administered 2016-08-12 – 2016-08-13 (×2): 50 mg via ORAL
  Filled 2016-08-12 (×2): qty 1

## 2016-08-12 MED ORDER — MAGIC MOUTHWASH
10.0000 mL | Freq: Three times a day (TID) | ORAL | Status: DC
  Administered 2016-08-12 – 2016-08-13 (×2): 10 mL via ORAL
  Filled 2016-08-12 (×4): qty 10

## 2016-08-12 MED ORDER — LORAZEPAM 1 MG PO TABS: 2.0000 mg | ORAL_TABLET | Freq: Four times a day (QID) | ORAL | Status: DC | PRN

## 2016-08-12 MED ORDER — LORAZEPAM 1 MG PO TABS: 2.0000 mg | ORAL_TABLET | ORAL | Status: DC

## 2016-08-12 MED ORDER — LORAZEPAM 1 MG PO TABS
1.0000 mg | ORAL_TABLET | Freq: Once | ORAL | Status: AC
  Administered 2016-08-12: 1 mg via ORAL
  Filled 2016-08-12: qty 1

## 2016-08-12 NOTE — Progress Notes (Signed)
Advanced Heart Failure Rounding Note   Subjective:    Yesterday milrinone weaned to 0.125 mcg. Todays Co-OX is 72%. Also losartan added.   Denies SOB. Refused to participate with PT.      Objective:   Weight Range:  Vital Signs:   Temp:  [97.6 F (36.4 C)-98.5 F (36.9 C)] 97.7 F (36.5 C) (04/20 0411) Pulse Rate:  [74-93] 93 (04/20 0411) Resp:  [16-18] 18 (04/20 0411) BP: (117-145)/(70-90) 139/90 (04/20 0555) SpO2:  [90 %-100 %] 96 % (04/20 0411) Weight:  [169 lb 3.2 oz (76.7 kg)] 169 lb 3.2 oz (76.7 kg) (04/20 0432) Last BM Date: 08/09/16 (per patient)  Weight change: Filed Weights   08/10/16 0422 08/11/16 0339 08/12/16 0432  Weight: 170 lb 8 oz (77.3 kg) 169 lb 12.8 oz (77 kg) 169 lb 3.2 oz (76.7 kg)    Intake/Output:   Intake/Output Summary (Last 24 hours) at 08/12/16 0708 Last data filed at 08/12/16 0557  Gross per 24 hour  Intake            782.2 ml  Output              650 ml  Net            132.2 ml     Physical Exam: CVP 2 General:  Elderly. In bed.  No resp difficulty HEENT: normal Neck: supple. no JVD. Carotids 2+ bilat; no bruits. No lymphadenopathy or thryomegaly appreciated. Cor: PMI nondisplaced. Regular rate & rhythm. No rubs, gallops or murmurs. Lungs: clear on room air.  Abdomen: soft, nontender, nondistended. No hepatosplenomegaly. No bruits or masses. Good bowel sounds. Extremities: no cyanosis, clubbing, rash, edema. RUE PICC  Neuro: alert & orientedx3, cranial nerves grossly intact. moves all 4 extremities w/o difficulty. Affect pleasant  Telemetry: NSR 80-90s   Labs: Basic Metabolic Panel:  Recent Labs Lab 08/08/16 0332 08/09/16 0500 08/10/16 0445 08/11/16 0532 08/12/16 0443  NA 120* 124* 126* 126* 126*  K 4.3 3.4* 4.0 3.9 3.9  CL 88* 90* 94* 95* 96*  CO2 20* 24 25 22  20*  GLUCOSE 67 94 123* 88 81  BUN 36* 29* 18 12 10   CREATININE 2.33* 1.55* 1.20 0.94 0.91  CALCIUM 8.3* 7.7* 7.9* 7.8* 7.7*  MG  --  1.7 1.8  --   --      Liver Function Tests:  Recent Labs Lab 08/08/16 0332 08/09/16 0500 08/10/16 0445 08/11/16 0532 08/12/16 0443  AST 482* 172* 92* 52* 45*  ALT 209* 143* 112* 83* 67*  ALKPHOS 109 87 103 101 100  BILITOT 1.1 1.3* 1.4* 1.8* 1.9*  PROT 6.3* 5.2* 5.7* 5.6* 5.9*  ALBUMIN 2.7* 2.3* 2.4* 2.3* 2.2*   No results for input(s): LIPASE, AMYLASE in the last 168 hours.  Recent Labs Lab 08/08/16 0955  AMMONIA 25    CBC:  Recent Labs Lab 08/05/16 1921 08/06/16 0103 08/07/16 0555 08/07/16 0918 08/08/16 0332 08/10/16 0445  WBC 5.3 5.6 7.5 7.1 6.6 5.0  NEUTROABS 3.1 3.8  --   --   --   --   HGB 12.6* 12.6* 13.1 12.9* 13.1 11.7*  HCT 35.5* 35.0* 36.5* 35.6* 36.4* 33.5*  MCV 82.9 82.9 82.4 82.2 82.9 83.1  PLT 213 199 206 204 189 189    Cardiac Enzymes: No results for input(s): CKTOTAL, CKMB, CKMBINDEX, TROPONINI in the last 168 hours.  BNP: BNP (last 3 results)  Recent Labs  09/14/15 0518 04/10/16 2203 08/05/16 1922  BNP 862.7* 1,828.2*  2,465.1*    ProBNP (last 3 results) No results for input(s): PROBNP in the last 8760 hours.    Other results:  Imaging: No results found.   Medications:     Scheduled Medications: . digoxin  0.125 mg Oral Daily  . enoxaparin (LOVENOX) injection  40 mg Subcutaneous Daily  . folic acid  1 mg Oral Daily  . hydrALAZINE  25 mg Oral Q8H  . insulin aspart  0-9 Units Subcutaneous TID WC  . insulin glargine  12 Units Subcutaneous Daily  . isosorbide mononitrate  30 mg Oral Daily  . losartan  25 mg Oral Daily  . multivitamin with minerals  1 tablet Oral Daily  . pravastatin  10 mg Oral Daily  . sertraline  100 mg Oral QHS  . sodium chloride flush  10-40 mL Intracatheter Q12H  . sodium chloride flush  3 mL Intravenous Q12H  . thiamine  100 mg Oral Daily  . traZODone  150 mg Oral QHS    Infusions: . sodium chloride 250 mL (08/11/16 2300)  . milrinone 0.125 mcg/kg/min (08/12/16 0411)    PRN Medications: sodium chloride,  acetaminophen, LORazepam, ondansetron (ZOFRAN) IV, sodium chloride flush, sodium chloride flush   Assessment/Plan/Discussion  1. Cardiogenic Shock  Initial CO-OX 40%.  Tolerating milrinone wean. CO-OX 72%. Stop milrinone. Renal function normalized. LFTs trending down.  2. A/C Combined Systolic/Diastolic Heart Failure ECHO 08/07/2016 EF 10-15%. Peak PA pressure 69 mm hg RV mildly dilated. He is stable. No bb with low output and cocaine abuse.   Volume status stable. No lasix for now.   He is on digoxin 0.125 Continue hydralazine 25 mg tid + imdur 30 mg daily Increase losartan to 50 mg daily.  No entresto with low CVP.  Would not use spiro with noncompliance.  2. Hyponatremia Sodium on admit 118. Todays sodium 126.   3. AKI- Creatinine on admit 1.1.  Creatinine  peaked2.3>1.55>1.2>0.94 >0.91. Normalized renal function.     Renal US on 4/15 without evidence of hydronephrosis 4. Elevated LFTs Suspect in the setting of cardiogenic shock.  LFTs continue to trend down.  [place] no abnormalities.  He is not on a statin.  5. CAD- non obstructive on LHC 2014. No statin with elevated LFTs. No CP.   6. ETOH abuse- Drinks multiple beers daily. May need rehab.  7. Substance Abuse- + Cocaine on admit.  8. Tobacco Abuse- Counseled on smoking cessation.  9. Hypomagnesium- Received 4 grams mag 4/19.  10 Deconditioning- PT recommending SNF. Refusing to participate with therapy.   He is not candidate for advanced therapies or home inotropes with substance abuse. Suspect he will be hard to manage given poor insight. Will refer to HF Paramedicine.   Needs Palliative Care consult for goals of care.   HF follow up set up.  Could go home today.   Length of Stay: 7  Amy Clegg NP-C  08/12/2016, 7:08 AM  Advanced Heart Failure Team Pager [phone] (M-F; 7a - 4p)   Patient seen with NP, agree with the above note.  He seems to be doing ok.  Denies dyspnea.  Stopped milrinone today.  Increase losartan to 50  mg daily and continue hydralazine/nitrates and digoxin.   Does not need Lasix currently, not volume overloaded.   Creatinine and LFTs have trended down nicely.   Not candidate for home milrinone or advanced therapies due to substance abuse.   Start screening for SNF.   Marca Ancona 08/12/2016 1:45 PM

## 2016-08-12 NOTE — Progress Notes (Signed)
CSW faxed over clinical to the state. CSW awaiting [facility] number for patient.  Marrianne Mood, MSW,  [employer] [phone]

## 2016-08-12 NOTE — Progress Notes (Signed)
Physical Therapy Treatment Patient Details Name: [name] MRN: [id number] DOB: [birth year] Today's Date: 08/12/2016    History of Present Illness 61 y.o. male with medical history significant for chronic combined systolic/diastolic CHF, nonobstructive CAD, depression with anxiety, hypertension, and insulin-dependent diabetes mellitus presented with 2-3 days of progressive exertional dyspnea and orthopnea.  Patient continues with ETOH use and cocaine use. Dx of acute on chronic CHF.    PT Comments    Pt ambulated 5' with RW then suddenly needed to sit 2* B knee pain. He reports he fell onto both knees approximately 6 weeks ago. SaO2 94% on RA, HR 80s with activity.   Follow Up Recommendations  SNF     Equipment Recommendations  None recommended by PT    Recommendations for Other Services       Precautions / Restrictions Precautions Precautions: Fall Restrictions Weight Bearing Restrictions: No    Mobility  Bed Mobility Overal bed mobility: Modified Independent Bed Mobility: Supine to Sit     Supine to sit: Modified independent (Device/Increase time)     General bed mobility comments: with bedrail  Transfers Overall transfer level: Needs assistance Equipment used: Rolling walker (2 wheeled) Transfers: Sit to/from [employer] Sit to Stand: Min assist;From elevated surface Stand pivot transfers: Min assist       General transfer comment: VCs for hand placement and to scoot to EOB  Ambulation/Gait Ambulation/Gait assistance: Min assist;Mod assist Ambulation Distance (Feet): 5 Feet Assistive device: Rolling walker (2 wheeled) Gait Pattern/deviations: Step-to pattern;Decreased stride length;Trunk flexed;Shuffle;Wide base of support     General Gait Details: distance limited by B knee pain, after walking 5' pt needed to sit 2* B knee pain, SaO2 94% on RA, HR 80s   Stairs            Wheelchair Mobility    Modified Rankin (Stroke  Patients Only)       Balance Overall balance assessment: Needs assistance   Sitting balance-Leahy Scale: Fair     Standing balance support: Bilateral upper extremity supported Standing balance-Leahy Scale: Poor Standing balance comment: UE support for balance                            Cognition Arousal/Alertness: Awake/alert Behavior During Therapy: Flat affect Overall Cognitive Status: Within Functional Limits for tasks assessed                                        Exercises      General Comments        Pertinent Vitals/Pain Pain Assessment: 0-10 Pain Score: 7  Pain Location: B knees with walking Pain Descriptors / Indicators: Sore Pain Intervention(s): Limited activity within patient's tolerance;Monitored during session    Home Living                      Prior Function            PT Goals (current goals can now be found in the care plan section) Acute Rehab PT Goals Patient Stated Goal: None stated, agrees to rehab PT Goal Formulation: With patient/family Time For Goal Achievement: 08/23/16 Potential to Achieve Goals: Good Progress towards PT goals: Not progressing toward goals - comment (limited by knee pain)    Frequency    Min 3X/week      PT Plan Current  plan remains appropriate    Co-evaluation             End of Session Equipment Utilized During Treatment: Gait belt Activity Tolerance: Patient limited by pain Patient left: with call bell/phone within reach;in chair;with chair alarm set Nurse Communication: Mobility status PT Visit Diagnosis: Other abnormalities of gait and mobility (R26.89);History of falling (Z91.81);Muscle weakness (generalized) (M62.81);Pain Pain - Right/Left: Right Pain - part of body: Knee     Time: 1610-9604 PT Time Calculation (min) (ACUTE ONLY): 22 min  Charges:  $Gait Training: 8-22 mins                    G Codes:         Tamala Ser 08/12/2016, 11:49 AM [phone]

## 2016-08-12 NOTE — Progress Notes (Signed)
PROGRESS NOTE    [name]  WUJ:[id number] DOB: 06/28/1955 DOA: 08/05/2016 PCP: [facility]   Brief Narrative:  61 y.o.malewith medical history significant forchronic combined systolic/diastolic CHF, nonobstructive CAD, depression with anxiety, hypertension, and insulin-dependent diabetes mellitus presents the emergency department with 2-3 days of progressive exertional dyspnea and orthopnea Assessment & Plan:  # Cardiogenic shock: improved.  -Patient is off milrinone  as per heart failure team. Renal function improving.   # Acute on chronic combined systolic/diastolic congestive heart failure: -Echo with EF of 10-15% -volume status okay, not on diuretics -Continue digoxin, hydralazine, Imdur, losartan -HF team appreciated. Waiting for social worker evaluation for safe discharge to SNF.  #Hyponatremia: In the setting of CHF: Serum sodium level improving to 126. Monitor BMP. I will check urine osmolality and sodium level.  #Acute kidney injury: Serum creatinine level improved. Renal ultrasound showed no evidence of hydronephrosis.  #Transaminitis: In the setting of cardiogenic shock. Liver enzymes are trending down.  #History of coronary artery disease  #Alcohol abuse: No sign of withdrawal.  #Substance abuse: Urine toxicology positive for cocaine on admission. Education provided to the patient.  #Hypertension: Continue current medication. Monitor blood pressure.  #Type 2 diabetes: Continue current insulin regimen. Monitor blood sugar level. A1c is 9.6.  Principal Problem:   Acute on chronic combined systolic and diastolic CHF (congestive heart failure) (HCC) Active Problems:   Hypertension   History of substance abuse   Non Obstructive CAD (coronary artery disease)   Hyperkalemia   Hyponatremia   Polysubstance abuse   Type 2 diabetes mellitus with hyperglycemia, with long-term current use of insulin (HCC)   Depression with anxiety   ARF (acute renal  failure) (HCC)   Elevated LFTs  DVT prophylaxis: Lovenox subcutaneous Code Status: Full code Family Communication: No family present at bedside Disposition Plan: Likely discharge to rehabilitation in 1-2 days, waiting for SW eval     Consultants:   Cardiology  Procedures:none Antimicrobials:none  Subjective: Patient was seen and examined at bedside. Off milrinone. Denied headache, dizziness, chest pain, shortness of breath, nausea or vomiting. Agreed for skilled nursing facility on discharge. Objective: Vitals:   08/12/16 0432 08/12/16 0555 08/12/16 0738 08/12/16 1118  BP:  139/90 130/75 130/75  Pulse:   88 85  Resp:   19 19  Temp:   97.5 F (36.4 C) 97.6 F (36.4 C)  TempSrc:   Oral Oral  SpO2:   94% 91%  Weight: 76.7 kg (169 lb 3.2 oz)     Height:        Intake/Output Summary (Last 24 hours) at 08/12/16 1349 Last data filed at 08/12/16 1223  Gross per 24 hour  Intake              910 ml  Output              850 ml  Net               60 ml   Filed Weights   08/10/16 0422 08/11/16 0339 08/12/16 0432  Weight: 77.3 kg (170 lb 8 oz) 77 kg (169 lb 12.8 oz) 76.7 kg (169 lb 3.2 oz)    Examination:  General exam: Not in distress, lying on bed comfortable Respiratory system: Clear bilateral, respiratory effort normal Cardiovascular system: Regular rate rhythm, S1 and S2 normal Gastrointestinal system: Abdomen is nondistended, soft and nontender. Normal bowel sounds heard. Central nervous system: Alert and oriented. No focal neurological deficits. Extremities: Symmetric 5 x 5  power. Skin: No rashes, lesions or ulcers Psychiatry: Judgement and insight appear normal. Mood & affect appropriate.     Data Reviewed: I have personally reviewed following labs and imaging studies  CBC:  Recent Labs Lab 08/05/16 1921 08/06/16 0103 08/07/16 0555 08/07/16 0918 08/08/16 0332 08/10/16 0445  WBC 5.3 5.6 7.5 7.1 6.6 5.0  NEUTROABS 3.1 3.8  --   --   --   --   HGB 12.6*  12.6* 13.1 12.9* 13.1 11.7*  HCT 35.5* 35.0* 36.5* 35.6* 36.4* 33.5*  MCV 82.9 82.9 82.4 82.2 82.9 83.1  PLT 213 199 206 204 189 189   Basic Metabolic Panel:  Recent Labs Lab 08/08/16 0332 08/09/16 0500 08/10/16 0445 08/11/16 0532 08/12/16 0443  NA 120* 124* 126* 126* 126*  K 4.3 3.4* 4.0 3.9 3.9  CL 88* 90* 94* 95* 96*  CO2 20* 24 25 22  20*  GLUCOSE 67 94 123* 88 81  BUN 36* 29* 18 12 10   CREATININE 2.33* 1.55* 1.20 0.94 0.91  CALCIUM 8.3* 7.7* 7.9* 7.8* 7.7*  MG  --  1.7 1.8  --   --    GFR: Estimated Creatinine Clearance: 91.3 mL/min (by C-G formula based on SCr of 0.91 mg/dL). Liver Function Tests:  Recent Labs Lab 08/08/16 0332 08/09/16 0500 08/10/16 0445 08/11/16 0532 08/12/16 0443  AST 482* 172* 92* 52* 45*  ALT 209* 143* 112* 83* 67*  ALKPHOS 109 87 103 101 100  BILITOT 1.1 1.3* 1.4* 1.8* 1.9*  PROT 6.3* 5.2* 5.7* 5.6* 5.9*  ALBUMIN 2.7* 2.3* 2.4* 2.3* 2.2*   No results for input(s): LIPASE, AMYLASE in the last 168 hours.  Recent Labs Lab 08/08/16 0955  AMMONIA 25   Coagulation Profile: No results for input(s): INR, PROTIME in the last 168 hours. Cardiac Enzymes: No results for input(s): CKTOTAL, CKMB, CKMBINDEX, TROPONINI in the last 168 hours. BNP (last 3 results) No results for input(s): PROBNP in the last 8760 hours. HbA1C: No results for input(s): HGBA1C in the last 72 hours. CBG:  Recent Labs Lab 08/11/16 1619 08/11/16 2135 08/12/16 0305 08/12/16 0740 08/12/16 1117  GLUCAP 149* 91 84 86 111*   Lipid Profile: No results for input(s): CHOL, HDL, LDLCALC, TRIG, CHOLHDL, LDLDIRECT in the last 72 hours. Thyroid Function Tests: No results for input(s): TSH, T4TOTAL, FREET4, T3FREE, THYROIDAB in the last 72 hours. Anemia Panel: No results for input(s): VITAMINB12, FOLATE, FERRITIN, TIBC, IRON, RETICCTPCT in the last 72 hours. Sepsis Labs: No results for input(s): PROCALCITON, LATICACIDVEN in the last 168 hours.  No results found for  this or any previous visit (from the past 240 hour(s)).       Radiology Studies: No results found.      Scheduled Meds: . digoxin  0.125 mg Oral Daily  . enoxaparin (LOVENOX) injection  40 mg Subcutaneous Daily  . folic acid  1 mg Oral Daily  . hydrALAZINE  25 mg Oral Q8H  . insulin aspart  0-9 Units Subcutaneous TID WC  . insulin glargine  12 Units Subcutaneous Daily  . isosorbide mononitrate  30 mg Oral Daily  . losartan  50 mg Oral Daily  . multivitamin with minerals  1 tablet Oral Daily  . pravastatin  10 mg Oral Daily  . sertraline  100 mg Oral QHS  . sodium chloride flush  10-40 mL Intracatheter Q12H  . sodium chloride flush  3 mL Intravenous Q12H  . thiamine  100 mg Oral Daily  . traZODone  150 mg  Oral QHS   Continuous Infusions: . sodium chloride 250 mL (08/11/16 2300)     LOS: 7 days    Thomas Vanrossum Jaynie Collins, MD [facility] Pager [phone]  If 7PM-7AM, please contact night-coverage [email or web address] Password [email or web address] 08/12/2016, 1:49 PM

## 2016-08-12 NOTE — Consult Note (Signed)
Consultation Note Date: 08/12/2016   Patient Name: [name]  DOB: [birth year]  MRN: [id number]  Age / Sex: 61 y.o., male  PCP: [facility] Referring Physician: Rosita Fire, MD  Reason for Consultation: Establishing goals of care  HPI/Patient Profile: 61 y.o. male  with past medical history of CHF, CAD, Depression, anxiety, HTN, DM, ongoing ETOH and cocaine use  admitted on 08/05/2016 with dyspnea and orthopnea. Workup revealed cardiogenic shock r/t hypoperfusion (improved with milrinone which has been stopped), CHF with EF 10-15%. Heart failure team consulted and patient not candidate for advanced therapies or home inotropes d/t ongoing substance use. Palliative medicine consulted for [place].  Clinical Assessment and Goals of Care: Met with patient at bedside. Introduced Palliative Medicine as Palliative medicine is specialized medical care for people living with serious illness. It focuses on providing relief from the symptoms and stress of a serious illness. The goal is to improve quality of life for both the patient and the family. Patient lives alone, has mother and sister in town. Feels a lot of pressure from his neighbors to continue getting high.  Functional status at home is decreased- SOB with ambuation, sitting, eating. He has been losing weight unintentionally. Discussed his disease state and trajectory. He became very emotional during our conversation. He had not thought about the fact that his heart was damaged "to a point of no return". Attempted further [place] conversation but patient needs time to process information re: his prognosis. He requested PMT return tomorrow for further conversation. He has complaints of anxiety and sore mouth- white coating on tongue noted, patient with difficulty talking -will treat for thrush with magic mouthwash. Noted lorazepam was previously cancelled  but will order one time dose due to emotional conversation.      Primary Decision Maker PATIENT    SUMMARY OF RECOMMENDATIONS -Lorazepam 78m po x 1 -PMT will follow for continued GOC -Vaseline to lips  -Thrush- magic mouthwash swish and spit TID     Code Status/Advance Care Planning:  Full code    Symptom Management:   As above  Prognosis:    < 6 months d/t adv CHF, not candidate for adv therapies  Discharge Planning: To Be Determined  Primary Diagnoses: Present on Admission: . Polysubstance abuse . Hyponatremia . Acute on chronic combined systolic and diastolic CHF (congestive heart failure) [place] . Non Obstructive CAD (coronary artery disease) . Hypertension . Hyperkalemia . Depression with anxiety   I have reviewed the medical record, interviewed the patient and family, and examined the patient. The following aspects are pertinent.  Past Medical History:  Diagnosis Date  . CH[place] (congestive heart failure) ([place]   . Coronary artery disease   . Depression   . Diabetes mellitus   . Hypertension   . Mental disorder   . MI (myocardial infarction) ([facility] 153  age [id number] patient   Social History   Social History  . Marital status: Widowed    Spouse name: N/A  . Number of children:  N/A  . Years of education: N/A   Social History Main Topics  . Smoking status: Current Every Day Smoker    Packs/day: 0.50    Types: Cigarettes  . Smokeless tobacco: Never Used  . Alcohol use No     Comment: sts he hasn't drunk in 6 months   . Drug use: Yes    Frequency: 7.0 times per week    Types: Cocaine, Marijuana     Comment: last use about 6 months ago  . Sexual activity: Yes   Other Topics Concern  . None   Social History Narrative  . None   Family History  Problem Relation Age of Onset  . Heart attack Father     >60s  . Hypertension Father   . Diabetes Father   . Heart attack Sister   . Hypertension Mother    Scheduled Meds: . digoxin  0.125 mg  Oral Daily  . enoxaparin (LOVENOX) injection  40 mg Subcutaneous Daily  . folic acid  1 mg Oral Daily  . hydrALAZINE  25 mg Oral Q8H  . insulin aspart  0-9 Units Subcutaneous TID WC  . insulin glargine  12 Units Subcutaneous Daily  . isosorbide mononitrate  30 mg Oral Daily  . losartan  50 mg Oral Daily  . magic mouthwash  10 mL Oral TID  . multivitamin with minerals  1 tablet Oral Daily  . pravastatin  10 mg Oral Daily  . sertraline  100 mg Oral QHS  . sodium chloride flush  10-40 mL Intracatheter Q12H  . sodium chloride flush  3 mL Intravenous Q12H  . thiamine  100 mg Oral Daily  . traZODone  150 mg Oral QHS   Continuous Infusions: . sodium chloride 250 mL (08/12/16 1400)   PRN Meds:.sodium chloride, acetaminophen, ondansetron (ZOFRAN) IV, sodium chloride flush, sodium chloride flush Medications Prior to Admission:  Prior to Admission medications   Medication Sig Start Date End Date Taking? Authorizing Provider  acetaminophen (TYLENOL) 325 MG tablet Take 650 mg by mouth every 6 (six) hours as needed for mild pain.   Yes Historical Provider, MD  ARIPiprazole (ABILIFY) 10 MG tablet Take 5 mg by mouth daily.   Yes Historical Provider, MD  aspirin 325 MG tablet Take 325 mg by mouth every 6 (six) hours as needed for moderate pain or headache.    Yes Historical Provider, MD  aspirin 81 MG chewable tablet Chew 81 mg by mouth daily.   Yes Historical Provider, MD  atorvastatin (LIPITOR) 20 MG tablet Take 20 mg by mouth daily at 6 PM.   Yes Historical Provider, MD  buPROPion (WELLBUTRIN) 100 MG tablet Take 100 mg by mouth at bedtime.   Yes Historical Provider, MD  carvedilol (COREG) 3.125 MG tablet Take 1 tablet (3.125 mg total) by mouth 2 (two) times daily with a meal. Patient taking differently: Take 12.5 mg by mouth 2 (two) times daily with a meal.  04/14/16  Yes Maryann Mikhail, DO  furosemide (LASIX) 40 MG tablet Take 1 tablet (40 mg total) by mouth 2 (two) times daily. Patient taking  differently: Take 40 mg by mouth daily.  04/14/16  Yes Maryann Mikhail, DO  metFORMIN (GLUCOPHAGE) 1000 MG tablet Take 0.5 tablets (500 mg total) by mouth 2 (two) times daily with a meal. For high blood sugar control Patient taking differently: Take 1,000 mg by mouth 2 (two) times daily with a meal. For high blood sugar control 12/27/12  Yes Encarnacion Slates, NP  Multiple Vitamin (MULTIVITAMIN WITH MINERALS) TABS tablet Take 1 tablet by mouth daily. 02/27/14  Yes Christina P Rama, MD  potassium chloride (K-DUR,KLOR-CON) 10 MEQ tablet Take 10 mEq by mouth daily.   Yes Historical Provider, MD  sertraline (ZOLOFT) 100 MG tablet Take 100 mg by mouth at bedtime.    Yes Historical Provider, MD  spironolactone (ALDACTONE) 25 MG tablet Take 25 mg by mouth daily.   Yes Historical Provider, MD  traZODone (DESYREL) 100 MG tablet Take 1 tablet (100 mg total) by mouth at bedtime. For sleep Patient taking differently: Take 150 mg by mouth at bedtime. For sleep 12/27/12  Yes Encarnacion Slates, NP  feeding supplement, GLUCERNA SHAKE, (GLUCERNA SHAKE) LIQD Take 237 mLs by mouth 3 (three) times daily between meals. Patient not taking: Reported on 04/14/2016 02/27/14   Venetia Maxon Rama, MD  folic acid (FOLVITE) 1 MG tablet Take 1 tablet (1 mg total) by mouth daily. Patient not taking: Reported on 04/14/2016 02/27/14   Venetia Maxon Rama, MD  guaiFENesin (MUCINEX) 600 MG 12 hr tablet Take 1 tablet (600 mg total) by mouth 2 (two) times daily. Patient not taking: Reported on 04/14/2016 09/15/15   Florencia Reasons, MD  Insulin Glargine (LANTUS SOLOSTAR) 100 UNIT/ML Solostar Pen Inject 20 Units into the skin every morning. Patient not taking: Reported on 08/08/2016 09/16/15   Kelvin Cellar, MD  lisinopril (PRINIVIL,ZESTRIL) 10 MG tablet Take 1 tablet (10 mg total) by mouth daily. Patient not taking: Reported on 08/08/2016 04/15/16   Velta Addison Mikhail, DO  magnesium oxide (MAG-OX) 400 (241.3 Mg) MG tablet Take 1 tablet (400 mg total) by mouth  daily. Patient not taking: Reported on 04/14/2016 09/15/15   Florencia Reasons, MD  pravastatin (PRAVACHOL) 10 MG tablet Take 1 tablet (10 mg total) by mouth daily. For high cholesterol control Patient not taking: Reported on 08/08/2016 09/16/15   Kelvin Cellar, MD  thiamine 100 MG tablet Take 1 tablet (100 mg total) by mouth daily. Patient not taking: Reported on 04/14/2016 02/27/14   Venetia Maxon Rama, MD   No Known Allergies Review of Systems  Constitutional: Positive for activity change, appetite change, fatigue and unexpected weight change.  HENT: Positive for dental problem, mouth sores and sore throat.   Respiratory: Positive for shortness of breath.   Psychiatric/Behavioral: Positive for sleep disturbance. The patient is nervous/anxious.     Physical Exam  Constitutional:  cachetic  HENT:  Head: Normocephalic and atraumatic.  Cardiovascular: Normal rate and regular rhythm.   Pulmonary/Chest: Effort normal.  labored  Abdominal: Soft.  Nursing note and vitals reviewed.   Vital Signs: BP 130/75 (BP Location: Left Arm)   Pulse 85   Temp 97.5 F (36.4 C) (Oral)   Resp 19   Ht _0  (1.905 m)   Wt 76.7 kg (169 lb 3.2 oz)   SpO2 91%   BMI 21.15 kg/m  Pain Assessment: No/denies pain   Pain Score: 0-No pain   SpO2: SpO2: 91 % O2 Device:SpO2: 91 % O2 Flow Rate: .O2 Flow Rate (L/min): 2 L/min  IO: Intake/output summary:  Intake/Output Summary (Last 24 hours) at 08/12/16 1621 Last data filed at 08/12/16 1400  Gross per 24 hour  Intake              781 ml  Output              850 ml  Net              -69 ml  LBM: Last BM Date: 08/09/16 (per patient) Baseline Weight: Weight: 70.8 kg (156 lb) Most recent weight: Weight: 76.7 kg (169 lb 3.2 oz)     Palliative Assessment/Data:     Thank you for this consult. Palliative medicine will continue to follow and assist as needed.   Time In: 1530 Time Out: 1630 Time Total: 60 minutes Greater than 50%  of this time was spent  counseling and coordinating care related to the above assessment and plan.  Signed by: Mariana Kaufman, AGNP-C Palliative Medicine    Please contact Palliative Medicine Team phone at [phone] for questions and concerns.  For individual provider: See Shea Evans

## 2016-08-13 DIAGNOSIS — I110 Hypertensive heart disease with heart failure: Secondary | ICD-10-CM | POA: Diagnosis not present

## 2016-08-13 DIAGNOSIS — D638 Anemia in other chronic diseases classified elsewhere: Secondary | ICD-10-CM | POA: Diagnosis not present

## 2016-08-13 DIAGNOSIS — M6281 Muscle weakness (generalized): Secondary | ICD-10-CM | POA: Diagnosis not present

## 2016-08-13 DIAGNOSIS — N170 Acute kidney failure with tubular necrosis: Secondary | ICD-10-CM | POA: Diagnosis not present

## 2016-08-13 DIAGNOSIS — R7989 Other specified abnormal findings of blood chemistry: Secondary | ICD-10-CM | POA: Diagnosis not present

## 2016-08-13 DIAGNOSIS — I5043 Acute on chronic combined systolic (congestive) and diastolic (congestive) heart failure: Secondary | ICD-10-CM | POA: Diagnosis not present

## 2016-08-13 DIAGNOSIS — E1165 Type 2 diabetes mellitus with hyperglycemia: Secondary | ICD-10-CM | POA: Diagnosis not present

## 2016-08-13 DIAGNOSIS — E876 Hypokalemia: Secondary | ICD-10-CM | POA: Diagnosis not present

## 2016-08-13 DIAGNOSIS — I5022 Chronic systolic (congestive) heart failure: Secondary | ICD-10-CM | POA: Diagnosis not present

## 2016-08-13 DIAGNOSIS — I2510 Atherosclerotic heart disease of native coronary artery without angina pectoris: Secondary | ICD-10-CM | POA: Diagnosis not present

## 2016-08-13 DIAGNOSIS — R531 Weakness: Secondary | ICD-10-CM | POA: Diagnosis not present

## 2016-08-13 DIAGNOSIS — E118 Type 2 diabetes mellitus with unspecified complications: Secondary | ICD-10-CM

## 2016-08-13 DIAGNOSIS — R2681 Unsteadiness on feet: Secondary | ICD-10-CM | POA: Diagnosis not present

## 2016-08-13 DIAGNOSIS — E871 Hypo-osmolality and hyponatremia: Secondary | ICD-10-CM | POA: Diagnosis not present

## 2016-08-13 LAB — BASIC METABOLIC PANEL
ANION GAP: 7 (ref 5–15)
BUN: 8 mg/dL (ref 6–20)
CHLORIDE: 97 mmol/L — AB (ref 101–111)
CO2: 21 mmol/L — ABNORMAL LOW (ref 22–32)
CREATININE: 0.9 mg/dL (ref 0.61–1.24)
Calcium: 7.8 mg/dL — ABNORMAL LOW (ref 8.9–10.3)
GFR calc non Af Amer: >60 mL/min (ref >60)
Glucose, Bld: 103 mg/dL — ABNORMAL HIGH (ref 65–99)
Potassium: 4 mmol/L (ref 3.5–5.1)
SODIUM: 125 mmol/L — AB (ref 135–145)

## 2016-08-13 LAB — GLUCOSE, CAPILLARY
Glucose-Capillary: 107 mg/dL — ABNORMAL HIGH (ref 65–99)
Glucose-Capillary: 98 mg/dL (ref 65–99)
Glucose-Capillary: 122 mg/dL — ABNORMAL HIGH (ref 65–99)

## 2016-08-13 LAB — COOXEMETRY PANEL
Carboxyhemoglobin: 1.7 % — ABNORMAL HIGH (ref 0.5–1.5)
Methemoglobin: 1 % (ref 0.0–1.5)
O2 SAT: 56.6 %
Total hemoglobin: 12.7 g/dL (ref 12.0–16.0)

## 2016-08-13 MED ORDER — INSULIN GLARGINE 100 UNIT/ML SOLOSTAR PEN: 12.0000 [IU] | PEN_INJECTOR | Freq: Every morning | SUBCUTANEOUS | 0 refills | Status: DC

## 2016-08-13 MED ORDER — FUROSEMIDE 40 MG PO TABS: 80.0000 mg | ORAL_TABLET | Freq: Every day | ORAL | 0 refills | Status: DC

## 2016-08-13 MED ORDER — LORAZEPAM 0.5 MG PO TABS
0.5000 mg | ORAL_TABLET | Freq: Once | ORAL | Status: AC
  Administered 2016-08-13: 0.5 mg via ORAL
  Filled 2016-08-13: qty 1

## 2016-08-13 MED ORDER — HYDRALAZINE HCL 25 MG PO TABS: 25.0000 mg | ORAL_TABLET | Freq: Three times a day (TID) | ORAL | 0 refills | Status: DC

## 2016-08-13 MED ORDER — INSULIN ASPART 100 UNIT/ML ~~LOC~~ SOLN: 0.0000 [IU] | Freq: Three times a day (TID) | SUBCUTANEOUS | Status: DC

## 2016-08-13 MED ORDER — LOSARTAN POTASSIUM 50 MG PO TABS: 50.0000 mg | ORAL_TABLET | Freq: Every day | ORAL | 0 refills | Status: DC

## 2016-08-13 MED ORDER — ISOSORBIDE MONONITRATE ER 30 MG PO TB24: 30.0000 mg | ORAL_TABLET | Freq: Every day | ORAL | 0 refills | Status: DC

## 2016-08-13 MED ORDER — METFORMIN HCL 1000 MG PO TABS: 1000.0000 mg | ORAL_TABLET | Freq: Two times a day (BID) | ORAL | Status: AC

## 2016-08-13 MED ORDER — DIGOXIN 125 MCG PO TABS: 0.1250 mg | ORAL_TABLET | Freq: Every day | ORAL | 0 refills | Status: DC

## 2016-08-13 MED ORDER — POTASSIUM CHLORIDE CRYS ER 10 MEQ PO TBCR: 20.0000 meq | EXTENDED_RELEASE_TABLET | Freq: Every day | ORAL | 0 refills | Status: DC

## 2016-08-13 NOTE — Progress Notes (Signed)
Advanced Heart Failure Rounding Note   Subjective:    Milrinone stopped yesterday. Co-ox 72% -> 57%. Says he feels ok. Walked 5 feet with PT yesterday. Stopped due to knee pain   Renal function stable. Weigh reported down 6 pounds.  Denies dyspnea.   Objective:   Weight Range:  Vital Signs:   Temp:  [97.5 F (36.4 C)-98 F (36.7 C)] 98 F (36.7 C) (04/21 0734) Pulse Rate:  [82-101] 99 (04/21 0734) Resp:  [18-23] 21 (04/21 0734) BP: (130-144)/(75-102) 140/94 (04/21 0306) SpO2:  [91 %-99 %] 99 % (04/21 0734) Weight:  [74.1 kg (163 lb 6.4 oz)] 74.1 kg (163 lb 6.4 oz) (04/21 0306) Last BM Date: 08/09/16 (per patient)  Weight change: Filed Weights   08/11/16 0339 08/12/16 0432 08/13/16 0306  Weight: 77 kg (169 lb 12.8 oz) 76.7 kg (169 lb 3.2 oz) 74.1 kg (163 lb 6.4 oz)    Intake/Output:   Intake/Output Summary (Last 24 hours) at 08/13/16 0913 Last data filed at 08/13/16 0600  Gross per 24 hour  Intake              790 ml  Output              715 ml  Net               75 ml     Physical Exam:  General: Lying in bed . No resp difficulty HEENT: normal Neck: supple. JVP 7. Carotids 2+ bilat; no bruits. No lymphadenopathy or thryomegaly appreciated. Cor: PMI laterally displaced. +s3 Lungs: clear Abdomen: soft, nontender, nondistended. No hepatosplenomegaly. No bruits or masses. Good bowel sounds. Extremities: no cyanosis, clubbing, rash, edema Neuro: alert & orientedx3, cranial nerves grossly intact. moves all 4 extremities w/o difficulty. Affect pleasant   Telemetry: NSR 90s Personally reviewed   Labs: Basic Metabolic Panel:  Recent Labs Lab 08/09/16 0500 08/10/16 0445 08/11/16 0532 08/12/16 0443 08/13/16 0427  NA 124* 126* 126* 126* 125*  K 3.4* 4.0 3.9 3.9 4.0  CL 90* 94* 95* 96* 97*  CO2 24 25 22  20* 21*  GLUCOSE 94 123* 88 81 103*  BUN 29* 18 12 10 8   CREATININE 1.55* 1.20 0.94 0.91 0.90  CALCIUM 7.7* 7.9* 7.8* 7.7* 7.8*  MG 1.7 1.8  --   --    --     Liver Function Tests:  Recent Labs Lab 08/08/16 0332 08/09/16 0500 08/10/16 0445 08/11/16 0532 08/12/16 0443  AST 482* 172* 92* 52* 45*  ALT 209* 143* 112* 83* 67*  ALKPHOS 109 87 103 101 100  BILITOT 1.1 1.3* 1.4* 1.8* 1.9*  PROT 6.3* 5.2* 5.7* 5.6* 5.9*  ALBUMIN 2.7* 2.3* 2.4* 2.3* 2.2*   No results for input(s): LIPASE, AMYLASE in the last 168 hours.  Recent Labs Lab 08/08/16 0955  AMMONIA 25    CBC:  Recent Labs Lab 08/07/16 0555 08/07/16 0918 08/08/16 0332 08/10/16 0445  WBC 7.5 7.1 6.6 5.0  HGB 13.1 12.9* 13.1 11.7*  HCT 36.5* 35.6* 36.4* 33.5*  MCV 82.4 82.2 82.9 83.1  PLT 206 204 189 189    Cardiac Enzymes: No results for input(s): CKTOTAL, CKMB, CKMBINDEX, TROPONINI in the last 168 hours.  BNP: BNP (last 3 results)  Recent Labs  09/14/15 0518 04/10/16 2203 08/05/16 1922  BNP 862.7* 1,828.2* 2,465.1*    ProBNP (last 3 results) No results for input(s): PROBNP in the last 8760 hours.    Other results:  Imaging: No results found.  Medications:     Scheduled Medications: . digoxin  0.125 mg Oral Daily  . enoxaparin (LOVENOX) injection  40 mg Subcutaneous Daily  . folic acid  1 mg Oral Daily  . hydrALAZINE  25 mg Oral Q8H  . insulin aspart  0-9 Units Subcutaneous TID WC  . insulin glargine  12 Units Subcutaneous Daily  . isosorbide mononitrate  30 mg Oral Daily  . losartan  50 mg Oral Daily  . magic mouthwash  10 mL Oral TID  . multivitamin with minerals  1 tablet Oral Daily  . pravastatin  10 mg Oral Daily  . sertraline  100 mg Oral QHS  . sodium chloride flush  10-40 mL Intracatheter Q12H  . sodium chloride flush  3 mL Intravenous Q12H  . thiamine  100 mg Oral Daily  . traZODone  150 mg Oral QHS    Infusions: . sodium chloride 250 mL (08/12/16 1800)    PRN Medications: sodium chloride, acetaminophen, ondansetron (ZOFRAN) IV, sodium chloride flush, sodium chloride flush   Assessment/Plan/Discussion   1.  Cardiogenic Shock due to a/c systolic HF 2. Hyponatremia 3. AKI- Creatinine on admit 1.1.  4. Elevated LFTs  5. CAD- non obstructive on LHC 2014. No statin with elevated LFTs. No CP.   6. ETOH abuse- Drinks multiple beers daily. May need rehab.  7. Substance Abuse- + Cocaine on admit.  8. Tobacco Abuse- Counseled on smoking cessation.  9. Hypomagnesium- 10 Deconditioning- PT recommending SNF. Refusing to participate with therapy.   Volume status looks good today. Co-ox ok off milrinone. Ok for d/c to SNF today on current medicine + lasix 80 daily and Kcl 20 daily. D/w Dr. Ronalee Belts   Patient at very high risk for recurrent decompensation and death with low EF, substance abuse and noncompliance. Agree with Palliative Care referral.   We will sign off.    Length of Stay: 8  Arvilla Meres MD 08/13/2016, 9:13 AM  Advanced Heart Failure Team Pager [phone] (M-F; 7a - 4p)

## 2016-08-13 NOTE — Clinical Social Work Note (Signed)
Clinical Social Worker facilitated patient discharge including contacting patient family and facility to confirm patient discharge plans. Clinical information faxed to facility [facility]) and family (Mother/Sister)  agreeable with plan.  CSW arranged ambulance transport via [facility] to [facility]. RN to call report prior to discharge.  Clinical Social Worker will sign off for now as social work intervention is no longer needed. Please consult [place] again if new need arises.  Joliana Claflin B. Gean Quint Clinical Social Work Dept Weekend Social Worker [phone] 12:26 PM

## 2016-08-13 NOTE — Clinical Social Work Placement (Signed)
   CLINICAL SOCIAL WORK PLACEMENT  NOTE  Date:  08/13/2016  Patient Details  Name: [name] MRN: [id number] Date of Birth: 24-Dec-1955  Clinical Social Work is seeking post-discharge placement for this patient at the Skilled  Nursing Facility level of care (*CSW will initial, date and re-position this form in  chart as items are completed):  Yes   Patient/family provided with [facility] Clinical Social Work Department's list of facilities offering this level of care within the geographic area requested by the patient (or if unable, by the patient's family).  Yes   Patient/family informed of their freedom to choose among providers that offer the needed level of care, that participate in Medicare, Medicaid or managed care program needed by the patient, have an available bed and are willing to accept the patient.  Yes   Patient/family informed of [facility]'s ownership interest in [facility] and [facility], as well as of the fact that they are under no obligation to receive care at these facilities.  [name] submitted to EDS on       [name] number received on 08/12/16     Existing [name] number confirmed on       FL2 transmitted to all facilities in geographic area requested by pt/family on 08/12/16     FL2 transmitted to all facilities within larger geographic area on       Patient informed that his/her managed care company has contracts with or will negotiate with certain facilities, including the following:        Yes   Patient/family informed of bed offers received.  Patient chooses bed at  [facility])     Physician recommends and patient chooses bed at      Patient to be transferred to  [facility]) on 08/13/16.  Patient to be transferred to facility by  [facility])     Patient family notified on 08/13/16 of transfer.  Name of family member notified:  Mother and Sister via telephone     PHYSICIAN Please sign FL2     Additional Comment:     _______________________________________________ Norlene Duel, LCSWA 08/13/2016, 12:28 PM

## 2016-08-13 NOTE — Discharge Summary (Signed)
Physician Discharge Summary  [name] ZOX:[id number] DOB: October 15, 1955 DOA: 08/05/2016  PCP: [facility]  Admit date: 08/05/2016 Discharge date: 08/13/2016  Admitted From:home Disposition:SNF  Recommendations for Outpatient Follow-up:  1. Follow up with PCP in 1-2 weeks 2. Please obtain BMP/CBC in one week   Home Health:SNF Equipment/Devices:none Discharge Condition:stable CODE STATUS:full Diet recommendation:carb modified heart healthy diet  Brief/Interim Summary: 61 y.o.malewith medical history significant forchronic combined systolic/diastolic CHF, nonobstructive CAD, depression with anxiety, hypertension, and insulin-dependent diabetes mellitus presents the emergency department with 2-3 days of progressive exertional dyspnea and orthopnea.  # Cardiogenic shock: improved.  -Patient is off milrinone. Renal function improved.   # Acute on chronic combined systolic/diastolic congestive heart failure: -Echo with EF of 10-15% -volume status okay, started lasix 80 mg and KCL by heart failure team. I discussed with Dr.Bensimhon today.  -Continue digoxin, hydralazine, Imdur, losartan -HF team appreciated. Outpatient follow up with cardiologist recommended.  #Chronic Hyponatremia in the setting of CHF: Serum sodium level low but stable. Resuming oral Lasix and potassium chloride. Recommended to monitor CBC and BMP in a week.  #Acute kidney injury: Serum creatinine level improved. Renal ultrasound showed no evidence of hydronephrosis.  #Transaminitis: In the setting of cardiogenic shock. Liver enzymes are trending down.  #History of coronary artery disease  #Alcohol abuse: No sign of withdrawal.  #Substance abuse: Urine toxicology positive for cocaine on admission. Education provided to the patient.  #Hypertension: Continue current medication. Monitor blood pressure.  #Type 2 diabetes: Continue home medications including insulin. Monitor blood sugar level.  A1c is 9.6.  Patient is clinically stable. I discussed with the heart failure team. Starting oral Lasix and potassium chloride. Continue current cardiac medications. I discussed with the social worker regarding discharge planning. Patient is medically stable to transfer his care to outpatient. Evaluated by palliative care service in the hospital, patient has poor prognosis because of low EF/CHF.  Discharge Diagnoses:  Principal Problem:   Acute on chronic combined systolic and diastolic CHF (congestive heart failure) (HCC) Active Problems:   Hypertension   History of substance abuse   Non Obstructive CAD (coronary artery disease)   Hyperkalemia   Hyponatremia   Polysubstance abuse   Type 2 diabetes mellitus with hyperglycemia, with long-term current use of insulin (HCC)   Depression with anxiety   ARF (acute renal failure) (HCC)   Elevated LFTs    Discharge Instructions  Discharge Instructions    (HEART FAILURE PATIENTS) Call MD:  Anytime you have any of the following symptoms: 1) 3 pound weight gain in 24 hours or 5 pounds in 1 week 2) shortness of breath, with or without a dry hacking cough 3) swelling in the hands, feet or stomach 4) if you have to sleep on extra pillows at night in order to breathe.    Complete by:  As directed    Call MD for:  difficulty breathing, headache or visual disturbances    Complete by:  As directed    Call MD for:  extreme fatigue    Complete by:  As directed    Call MD for:  hives    Complete by:  As directed    Call MD for:  persistant dizziness or light-headedness    Complete by:  As directed    Call MD for:  persistant nausea and vomiting    Complete by:  As directed    Call MD for:  severe uncontrolled pain    Complete by:  As directed  Call MD for:  temperature >100.4    Complete by:  As directed    Diet - low sodium heart healthy    Complete by:  As directed    Diet Carb Modified    Complete by:  As directed    Increase activity  slowly    Complete by:  As directed      Allergies as of 08/13/2016   No Known Allergies     Medication List    STOP taking these medications   carvedilol 3.125 MG tablet Commonly known as:  COREG   feeding supplement (GLUCERNA SHAKE) Liqd   folic acid 1 MG tablet Commonly known as:  FOLVITE   guaiFENesin 600 MG 12 hr tablet Commonly known as:  MUCINEX   lisinopril 10 MG tablet Commonly known as:  PRINIVIL,ZESTRIL   magnesium oxide 400 (241.3 Mg) MG tablet Commonly known as:  MAG-OX   pravastatin 10 MG tablet Commonly known as:  PRAVACHOL   spironolactone 25 MG tablet Commonly known as:  ALDACTONE   thiamine 100 MG tablet     TAKE these medications   acetaminophen 325 MG tablet Commonly known as:  TYLENOL Take 650 mg by mouth every 6 (six) hours as needed for mild pain.   ARIPiprazole 10 MG tablet Commonly known as:  ABILIFY Take 5 mg by mouth daily.   aspirin 81 MG chewable tablet Chew 81 mg by mouth daily. What changed:  Another medication with the same name was removed. Continue taking this medication, and follow the directions you see here.   atorvastatin 20 MG tablet Commonly known as:  LIPITOR Take 20 mg by mouth daily at 6 PM.   buPROPion 100 MG tablet Commonly known as:  WELLBUTRIN Take 100 mg by mouth at bedtime.   digoxin 0.125 MG tablet Commonly known as:  LANOXIN Take 1 tablet (0.125 mg total) by mouth daily. Start taking on:  08/14/2016   furosemide 40 MG tablet Commonly known as:  LASIX Take 2 tablets (80 mg total) by mouth daily. What changed:  how much to take  when to take this   hydrALAZINE 25 MG tablet Commonly known as:  APRESOLINE Take 1 tablet (25 mg total) by mouth every 8 (eight) hours.   Insulin Glargine 100 UNIT/ML Solostar Pen Commonly known as:  LANTUS SOLOSTAR Inject 12 Units into the skin every morning. What changed:  how much to take   isosorbide mononitrate 30 MG 24 hr tablet Commonly known as:   IMDUR Take 1 tablet (30 mg total) by mouth daily. Start taking on:  08/14/2016   losartan 50 MG tablet Commonly known as:  COZAAR Take 1 tablet (50 mg total) by mouth daily. Start taking on:  08/14/2016   metFORMIN 1000 MG tablet Commonly known as:  GLUCOPHAGE Take 1 tablet (1,000 mg total) by mouth 2 (two) times daily with a meal. For high blood sugar control What changed:  how much to take   multivitamin with minerals Tabs tablet Take 1 tablet by mouth daily.   potassium chloride 10 MEQ tablet Commonly known as:  K-DUR,KLOR-CON Take 2 tablets (20 mEq total) by mouth daily. What changed:  how much to take   sertraline 100 MG tablet Commonly known as:  ZOLOFT Take 100 mg by mouth at bedtime.   traZODone 100 MG tablet Commonly known as:  DESYREL Take 1 tablet (100 mg total) by mouth at bedtime. For sleep What changed:  how much to take  additional instructions  Follow-up Information    Marca Ancona, MD Follow up on 08/22/2016.   Specialty:  Cardiology Why:  3:00 Garage Code 6000 Contact information: [address] [place] [address] [phone]        [facility]. Schedule an appointment as soon as possible for a visit in 1 week(s).   Specialty:  General Practice Contact information: [address] [place] [address] [phone]          No Known Allergies  Consultations: Cardiology   Subjective: Patient was seen and examined at bedside. Denied headache, dizziness, nausea, vomiting, chest pain, shortness of breath. Denies abdominal pain.  Discharge Exam: Vitals:   08/13/16 0734 08/13/16 0938  BP:    Pulse: 99 93  Resp: (!) 21   Temp: 98 F (36.7 C)    Vitals:   08/12/16 2330 08/13/16 0306 08/13/16 0734 08/13/16 0938  BP: (!) 144/102 (!) 140/94    Pulse: 97 (!) 101 99 93  Resp: (!) 23 20 (!) 21   Temp: 97.9 F (36.6 C) 97.9 F (36.6 C) 98 F (36.7 C)   TempSrc: Oral Axillary Axillary   SpO2: 97%  98% 99%   Weight:  74.1 kg (163 lb 6.4 oz)    Height:        General: Pt is alert, awake, not in acute distress Cardiovascular: RRR, S1/S2 +, no rubs, no gallops Respiratory: CTA bilaterally, no wheezing, no rhonchi Abdominal: Soft, NT, ND, bowel sounds + Extremities: no edema, no cyanosis    The results of significant diagnostics from this hospitalization (including imaging, microbiology, ancillary and laboratory) are listed below for reference.     Microbiology: No results found for this or any previous visit (from the past 240 hour(s)).   Labs: BNP (last 3 results)  Recent Labs  09/14/15 0518 04/10/16 2203 08/05/16 1922  BNP 862.7* 1,828.2* 2,465.1*   Basic Metabolic Panel:  Recent Labs Lab 08/09/16 0500 08/10/16 0445 08/11/16 0532 08/12/16 0443 08/13/16 0427  NA 124* 126* 126* 126* 125*  K 3.4* 4.0 3.9 3.9 4.0  CL 90* 94* 95* 96* 97*  CO2 24 25 22  20* 21*  GLUCOSE 94 123* 88 81 103*  BUN 29* 18 12 10 8   CREATININE 1.55* 1.20 0.94 0.91 0.90  CALCIUM 7.7* 7.9* 7.8* 7.7* 7.8*  MG 1.7 1.8  --   --   --    Liver Function Tests:  Recent Labs Lab 08/08/16 0332 08/09/16 0500 08/10/16 0445 08/11/16 0532 08/12/16 0443  AST 482* 172* 92* 52* 45*  ALT 209* 143* 112* 83* 67*  ALKPHOS 109 87 103 101 100  BILITOT 1.1 1.3* 1.4* 1.8* 1.9*  PROT 6.3* 5.2* 5.7* 5.6* 5.9*  ALBUMIN 2.7* 2.3* 2.4* 2.3* 2.2*   No results for input(s): LIPASE, AMYLASE in the last 168 hours.  Recent Labs Lab 08/08/16 0955  AMMONIA 25   CBC:  Recent Labs Lab 08/07/16 0555 08/07/16 0918 08/08/16 0332 08/10/16 0445  WBC 7.5 7.1 6.6 5.0  HGB 13.1 12.9* 13.1 11.7*  HCT 36.5* 35.6* 36.4* 33.5*  MCV 82.4 82.2 82.9 83.1  PLT 206 204 189 189   Cardiac Enzymes: No results for input(s): CKTOTAL, CKMB, CKMBINDEX, TROPONINI in the last 168 hours. BNP: Invalid input(s): POCBNP CBG:  Recent Labs Lab 08/12/16 1117 08/12/16 1608 08/12/16 2039 08/13/16 0307 08/13/16 0726   GLUCAP 111* 166* 120* 107* 98   D-Dimer No results for input(s): DDIMER in the last 72 hours. Hgb A1c No  results for input(s): HGBA1C in the last 72 hours. Lipid Profile No results for input(s): CHOL, HDL, LDLCALC, TRIG, CHOLHDL, LDLDIRECT in the last 72 hours. Thyroid function studies No results for input(s): TSH, T4TOTAL, T3FREE, THYROIDAB in the last 72 hours.  Invalid input(s): FREET3 Anemia work up No results for input(s): VITAMINB12, FOLATE, FERRITIN, TIBC, IRON, RETICCTPCT in the last 72 hours. Urinalysis    Component Value Date/Time   COLORURINE YELLOW 12/25/2015 1210   APPEARANCEUR CLEAR 12/25/2015 1210   LABSPEC 1.015 12/25/2015 1210   PHURINE 5.5 12/25/2015 1210   GLUCOSEU 500 (A) 12/25/2015 1210   HGBUR NEGATIVE 12/25/2015 1210   BILIRUBINUR NEGATIVE 12/25/2015 1210   KETONESUR NEGATIVE 12/25/2015 1210   PROTEINUR NEGATIVE 12/25/2015 1210   UROBILINOGEN 2.0 (H) 02/20/2014 1339   NITRITE NEGATIVE 12/25/2015 1210   LEUKOCYTESUR NEGATIVE 12/25/2015 1210   Sepsis Labs Invalid input(s): PROCALCITONIN,  WBC,  LACTICIDVEN Microbiology No results found for this or any previous visit (from the past 240 hour(s)).   Time coordinating discharge: 33 minutes  SIGNED:   Maxie Barb, MD  [facility] 08/13/2016, 9:49 AM  If 7PM-7AM, please contact night-coverage [email or web address] Password [email or web address]

## 2016-08-13 NOTE — Progress Notes (Signed)
Patient has been discharged to [facility] in [place]. I have attempted to give report an was on hold for 15 min. I have left my number to the receptionist Toniann Fail to have the nurse call me for report.  Colleen Can, RN

## 2016-08-15 ENCOUNTER — Non-Acute Institutional Stay (SKILLED_NURSING_FACILITY): Payer: Medicare Other | Admitting: Internal Medicine

## 2016-08-15 ENCOUNTER — Encounter: Payer: Self-pay | Admitting: Internal Medicine

## 2016-08-15 DIAGNOSIS — R7989 Other specified abnormal findings of blood chemistry: Secondary | ICD-10-CM

## 2016-08-15 DIAGNOSIS — E876 Hypokalemia: Secondary | ICD-10-CM

## 2016-08-15 DIAGNOSIS — I2510 Atherosclerotic heart disease of native coronary artery without angina pectoris: Secondary | ICD-10-CM

## 2016-08-15 DIAGNOSIS — E1165 Type 2 diabetes mellitus with hyperglycemia: Secondary | ICD-10-CM

## 2016-08-15 DIAGNOSIS — E871 Hypo-osmolality and hyponatremia: Secondary | ICD-10-CM | POA: Diagnosis not present

## 2016-08-15 DIAGNOSIS — D638 Anemia in other chronic diseases classified elsewhere: Secondary | ICD-10-CM

## 2016-08-15 DIAGNOSIS — I5022 Chronic systolic (congestive) heart failure: Secondary | ICD-10-CM | POA: Diagnosis not present

## 2016-08-15 DIAGNOSIS — I2583 Coronary atherosclerosis due to lipid rich plaque: Secondary | ICD-10-CM | POA: Diagnosis not present

## 2016-08-15 DIAGNOSIS — Z794 Long term (current) use of insulin: Secondary | ICD-10-CM

## 2016-08-15 DIAGNOSIS — R945 Abnormal results of liver function studies: Secondary | ICD-10-CM

## 2016-08-15 DIAGNOSIS — R531 Weakness: Secondary | ICD-10-CM

## 2016-08-15 DIAGNOSIS — F418 Other specified anxiety disorders: Secondary | ICD-10-CM

## 2016-08-15 NOTE — Progress Notes (Signed)
LOCATION: [name]  PCP: [facility]   Code Status: Full Code  Goals of care: Advanced Directive information Advanced Directives 08/07/2016  Does Patient Have a Medical Advance Directive? No  Type of Advance Directive -  Copy of Healthcare Power of Attorney in Chart? -  Would patient like information on creating a medical advance directive? No - Patient declined  Pre-existing out of facility DNR order (yellow form or pink MOST form) -  Some encounter information is confidential and restricted. Go to Review Flowsheets activity to see all data.       Extended Emergency Contact Information Primary Emergency Contact: [name] Address: [address]          [place], [place] [address] [place] Home Phone: [phone] Relation: Mother Secondary Emergency Contact: [name] [place] Mobile Phone: [phone] Relation: Sister   No Known Allergies  Chief Complaint  Patient presents with  . New Admit To SNF    New Admission Visit      HPI:  Patient is a 61 y.o. male seen today for short term rehabilitation post hospital admission from 08/05/16-08/13/16 with acute respiratory failure and cardiogenic shock with acute on chronic systolic chf exacerbation. He required milrinone drip and iv lasix. echocardiogram showed EF 10-15%. He had hyponatremia, acute renal failure and deranged LFT. His lasix was then held and deranged LFT were thought to be from cardiogenic shock. He was followed by cardiology. He has PMH of CHF, CAD, depression, cocaine and alcohol dependence, HTN, DM among others. He is seen in his room today.   Review of Systems:  Constitutional: Negative for fever, chills, diaphoresis. Feels weak and tired. HENT: Negative for headache, congestion, nasal discharge, sore throat, difficulty swallowing.   Eyes: Negative for eye pain, blurred vision, double vision and discharge.  Respiratory: Negative for cough and wheezing.  Positive for shortness of breath with exertion.   Cardiovascular: Negative for chest pain, palpitations, leg swelling.  Gastrointestinal: Negative for nausea, vomiting, heartburn, abdominal pain, melena, diarrhea and constipation. Positive for poor appetite. Last bowel movement was this morning.  Genitourinary: Negative for dysuria and flank pain.  Musculoskeletal: Negative for pain, fall in the facility.  Skin: Negative for itching, rash.  Neurological: Negative for dizziness. Positive for chronic numbness/tingling to left leg. Psychiatric/Behavioral: Negative for depression   Past Medical History:  Diagnosis Date  . CHF (congestive heart failure) (HCC)   . Coronary artery disease   . Depression   . Diabetes mellitus   . Hypertension   . Mental disorder   . MI (myocardial infarction) (HCC) 53   age 71 per patient   Past Surgical History:  Procedure Laterality Date  . ANGIOPLASTY  1984   at age 2  . LEFT AND RIGHT HEART CATHETERIZATION WITH CORONARY/GRAFT ANGIOGRAM  08/13/2012   Procedure: LEFT AND RIGHT HEART CATHETERIZATION WITH [name];  Surgeon: Kathleene Hazel, MD;  Location: [facility];  Service: Cardiovascular;;   Social History:   reports that he has been smoking Cigarettes.  He has been smoking about 0.50 packs per day. He has never used smokeless tobacco. He reports that he uses drugs, including Cocaine and Marijuana, about 7 times per week. He reports that he does not drink alcohol.  Family History  Problem Relation Age of Onset  . Heart attack Father     >60s  . Hypertension Father   . Diabetes Father   . Heart attack Sister   . Hypertension  Mother     Medications: Allergies as of 08/15/2016   No Known Allergies     Medication List       Accurate as of 08/15/16 11:58 AM. Always use your most recent med list.          acetaminophen 325 MG tablet Commonly known as:  TYLENOL Take 650 mg by mouth every 6 (six) hours as needed  for mild pain.   ALPRAZolam 1 MG tablet Commonly known as:  XANAX Take 1 mg by mouth every 8 (eight) hours as needed for anxiety.   ARIPiprazole 10 MG tablet Commonly known as:  ABILIFY Take 5 mg by mouth daily.   aspirin 81 MG chewable tablet Chew 81 mg by mouth daily.   atorvastatin 20 MG tablet Commonly known as:  LIPITOR Take 20 mg by mouth daily at 6 PM.   buPROPion 100 MG tablet Commonly known as:  WELLBUTRIN Take 100 mg by mouth daily.   digoxin 0.125 MG tablet Commonly known as:  LANOXIN Take 1 tablet (0.125 mg total) by mouth daily.   furosemide 40 MG tablet Commonly known as:  LASIX Take 2 tablets (80 mg total) by mouth daily.   hydrALAZINE 25 MG tablet Commonly known as:  APRESOLINE Take 1 tablet (25 mg total) by mouth every 8 (eight) hours.   Insulin Glargine 100 UNIT/ML Solostar Pen Commonly known as:  LANTUS SOLOSTAR Inject 12 Units into the skin every morning.   isosorbide mononitrate 30 MG 24 hr tablet Commonly known as:  IMDUR Take 1 tablet (30 mg total) by mouth daily.   losartan 50 MG tablet Commonly known as:  COZAAR Take 1 tablet (50 mg total) by mouth daily.   metFORMIN 1000 MG tablet Commonly known as:  GLUCOPHAGE Take 1 tablet (1,000 mg total) by mouth 2 (two) times daily with a meal. For high blood sugar control   multivitamin with minerals Tabs tablet Take 1 tablet by mouth daily.   potassium chloride 10 MEQ tablet Commonly known as:  K-DUR,KLOR-CON Take 2 tablets (20 mEq total) by mouth daily.   sertraline 100 MG tablet Commonly known as:  ZOLOFT Take 100 mg by mouth at bedtime.   traZODone 100 MG tablet Commonly known as:  DESYREL Take 1 tablet (100 mg total) by mouth at bedtime. For sleep       Immunizations: Immunization History  Administered Date(s) Administered  . Influenza,inj,Quad PF,36+ Mos 02/21/2014, 04/13/2016  . PPD Test 08/13/2016  . Pneumococcal Polysaccharide-23 04/14/2016     Physical Exam: Vitals:    08/15/16 1154  BP: 140/90  Pulse: 91  Resp: 20  Temp: 97.9 F (36.6 C)  TempSrc: Oral  SpO2: 95%  Weight: 171 lb (77.6 kg)  Height: 6\' 3"  (1.905 m)   Body mass index is 21.37 kg/m.  General- adult male, thin built, in no acute distress Head- normocephalic, atraumatic Nose- no nasal discharge Throat- moist mucus membrane, normal oropharynx Eyes- PERRLA, EOMI, no pallor, no icterus, no discharge, normal conjunctiva, normal sclera Neck- no cervical lymphadenopathy Cardiovascular- normal s1,s2, no murmur Respiratory- bilateral clear to auscultation, no wheeze, no rhonchi, no crackles, no use of accessory muscles Abdomen- bowel sounds present, soft, non tender, no guarding or rigidity Musculoskeletal- able to move all 4 extremities,generalized weakness, no leg edema Neurological- alert and oriented to person and place only Skin- warm and dry Psychiatry- normal mood and affect    Labs reviewed: Basic Metabolic Panel:  Recent Labs  05/69/79 0337  08/09/16 0500 08/10/16  1610 08/11/16 0532 08/12/16 0443 08/13/16 0427  NA 133*  < > 124* 126* 126* 126* 125*  K 4.1  < > 3.4* 4.0 3.9 3.9 4.0  CL 99*  < > 90* 94* 95* 96* 97*  CO2 25  < > 24 25 22  20* 21*  GLUCOSE 345*  < > 94 123* 88 81 103*  BUN 14  < > 29* 18 12 10 8   CREATININE 0.98  < > 1.55* 1.20 0.94 0.91 0.90  CALCIUM 8.0*  < > 7.7* 7.9* 7.8* 7.7* 7.8*  MG 2.1  --  1.7 1.8  --   --   --   < > = values in this interval not displayed. Liver Function Tests:  Recent Labs  08/10/16 0445 08/11/16 0532 08/12/16 0443  AST 92* 52* 45*  ALT 112* 83* 67*  ALKPHOS 103 101 100  BILITOT 1.4* 1.8* 1.9*  PROT 5.7* 5.6* 5.9*  ALBUMIN 2.4* 2.3* 2.2*   No results for input(s): LIPASE, AMYLASE in the last 8760 hours.  Recent Labs  08/08/16 0955  AMMONIA 25   CBC:  Recent Labs  09/07/15 0805  08/05/16 1921 08/06/16 0103  08/07/16 0918 08/08/16 0332 08/10/16 0445  WBC 6.3  < > 5.3 5.6  < > 7.1 6.6 5.0    NEUTROABS 3.4  --  3.1 3.8  --   --   --   --   HGB 15.2  < > 12.6* 12.6*  < > 12.9* 13.1 11.7*  HCT 43.2  < > 35.5* 35.0*  < > 35.6* 36.4* 33.5*  MCV 85.9  < > 82.9 82.9  < > 82.2 82.9 83.1  PLT 209  < > 213 199  < > 204 189 189  < > = values in this interval not displayed. Cardiac Enzymes: No results for input(s): CKTOTAL, CKMB, CKMBINDEX, TROPONINI in the last 8760 hours. BNP: Invalid input(s): POCBNP CBG:  Recent Labs  08/13/16 0307 08/13/16 0726 08/13/16 1121  GLUCAP 107* 98 122*    Radiological Exams: Dg Chest 2 View  Result Date: 08/05/2016 CLINICAL DATA:  Drowsiness/lethargy, shortness of breath. EXAM: CHEST  2 VIEW COMPARISON:  04/12/2016 FINDINGS: Moderate bilateral pleural effusions with bilateral lower lobe airspace opacities, some worsening compared to 04/12/16. Pneumonia cannot be excluded. Faint interstitial accentuation. Prominent retrosternal clear space compatible with emphysema. IMPRESSION: 1. Chronic bilateral pleural effusions and chronic bilateral lower lobe airspace opacities. Pleural effusions seems slightly larger than on 04/12/2016. Electronically Signed   By: Gaylyn Rong M.D.   On: 08/05/2016 20:21   US Abdomen Complete  Result Date: 08/08/2016 CLINICAL DATA:  Elevated LFTs.  Initial encounter. EXAM: ABDOMEN ULTRASOUND COMPLETE COMPARISON:  Abdominal ultrasound performed 02/21/2014, and CT of the abdomen and pelvis performed 10/23/2008 FINDINGS: Gallbladder: No gallstones or wall thickening visualized. No sonographic Murphy sign noted by sonographer. Common bile duct: Diameter: 0.2 cm, within normal limits in caliber. Liver: No focal lesion identified. Within normal limits in parenchymal echogenicity. IVC: No abnormality visualized. Pancreas: Visualized portion unremarkable. Spleen: Size and appearance within normal limits. Right Kidney: Length: 13.2 cm. Echogenicity within normal limits. No mass or hydronephrosis visualized. Left Kidney: Length: 12.2  cm. Echogenicity within normal limits. Left-sided renal pelvicaliectasis is stable from prior studies. No significant hydronephrosis or mass visualized. Abdominal aorta: No aneurysm visualized. There is obscuration of the mid abdominal aorta and the common iliac arteries overlying structures. Other findings: Small bilateral pleural effusions are seen. IMPRESSION: 1. No acute abnormality seen to explain  the patient's symptoms. 2. Small bilateral pleural effusions seen. Electronically Signed   By: Roanna Raider M.D.   On: 08/08/2016 20:51   US Renal  Result Date: 08/07/2016 CLINICAL DATA:  Acute renal failure. EXAM: RENAL / URINARY TRACT ULTRASOUND COMPLETE COMPARISON:  Two-view chest x-ray 08/05/2016 FINDINGS: Right Kidney: Length: 13.0 cm, within normal limits. Echogenicity within normal limits. No mass or hydronephrosis visualized. Left Kidney: Length: 12.9 cm, within normal limits. Echogenicity within normal limits. There is mild dilation of the renal collecting system without significant hydronephrosis. Bladder: Appears normal for degree of bladder distention. Moderate bilateral pleural effusions are noted, right greater left. A small amount of abdominal ascites is present as well. IMPRESSION: 1. Mild dilation of the left renal collecting system without significant hydronephrosis. 2. The right kidney is normal. 3. Moderate bilateral pleural effusions, right greater than left. 4. Small volume ascites. Electronically Signed   By: Marin Roberts M.D.   On: 08/07/2016 11:16    Assessment/Plan  Generalized weakness From physical deconditioning. Will have him work with physical therapy and occupational therapy team to help with gait training and muscle strengthening exercises.fall precautions. Skin care. Encourage to be out of bed.   Chronic systolic chf Ef 10-15%. Will need cardiology f/u. Monitor daily weight x 2 weeks and then 3 days a week. Continue digoxin 0.125 mg daily, lasix 80 mg daily,  isosorbide mononitrate 30 mg daily, hydralazine 25 mg tid and losartan 50 mg daily. Check bmp.  Hypokalemia With him on lasix, check bmp   Hyponatremia With him on lasix, check bmp  Anemia of chronic disease Monitor cbc  Elevated LFT 2/2 cardiogenic shock, clinically stable, monitor LFT  Depression with Anxiety  Psychiatry consult. Continue bupropion, abilify, trazodone and sertraline with prn xanax for now  CAD Chest pain free. Continue losartan, isosorbide and hydralazine with statin and aspirin  Dm type 2 Lab Results  Component Value Date   HGBA1C 9.6 (H) 08/05/2016   Monitor cbg. Continue metformin 1000 mg bid and lantus 12 u daily.    Goals of care: short term rehabilitation   Labs/tests ordered: cbc, cmp 08/18/16  Family/ staff Communication: reviewed care plan with patient and nursing supervisor    Oneal Grout, MD [facility] [address] [place], [place] [address] Cell Phone (Monday-Friday 8 am - 5 pm): [phone] On Call: [phone] and follow prompts after 5 pm and on weekends Office Phone: [phone] Office Fax: [phone]

## 2016-08-17 DIAGNOSIS — F411 Generalized anxiety disorder: Secondary | ICD-10-CM

## 2016-08-17 DIAGNOSIS — Z7189 Other specified counseling: Secondary | ICD-10-CM

## 2016-08-18 ENCOUNTER — Telehealth (HOSPITAL_COMMUNITY): Payer: Self-pay | Admitting: Surgery

## 2016-08-18 LAB — HEPATIC FUNCTION PANEL
ALT: 34 U/L (ref 10–40)
AST: 34 U/L (ref 14–40)
Alkaline Phosphatase: 98 U/L (ref 25–125)
Bilirubin, Total: 1.2 mg/dL

## 2016-08-18 LAB — BASIC METABOLIC PANEL
BUN: 15 mg/dL (ref 4–21)
Creatinine: 0.9 mg/dL (ref 0.6–1.3)
Glucose: 45 mg/dL
Potassium: 4.1 mmol/L (ref 3.4–5.3)
Sodium: 131 mmol/L — AB (ref 137–147)

## 2016-08-18 LAB — CBC AND DIFFERENTIAL
HCT: 42 % (ref 41–53)
HEMOGLOBIN: 14.4 g/dL (ref 13.5–17.5)
PLATELETS: 248 10*3/uL (ref 150–399)
WBC: 4.4 10*3/mL

## 2016-08-18 NOTE — Telephone Encounter (Signed)
Patient referred to the Heart Failure [facility]. I sent the appropriate forms via secure email to the Community Paramedic. 

## 2016-08-19 ENCOUNTER — Encounter: Payer: Self-pay | Admitting: Family

## 2016-08-19 ENCOUNTER — Non-Acute Institutional Stay (SKILLED_NURSING_FACILITY): Payer: Medicare Other | Admitting: Family

## 2016-08-19 DIAGNOSIS — R2681 Unsteadiness on feet: Secondary | ICD-10-CM

## 2016-08-19 DIAGNOSIS — Z794 Long term (current) use of insulin: Secondary | ICD-10-CM | POA: Diagnosis not present

## 2016-08-19 DIAGNOSIS — E118 Type 2 diabetes mellitus with unspecified complications: Secondary | ICD-10-CM

## 2016-08-19 DIAGNOSIS — I110 Hypertensive heart disease with heart failure: Secondary | ICD-10-CM | POA: Diagnosis not present

## 2016-08-19 DIAGNOSIS — M6281 Muscle weakness (generalized): Secondary | ICD-10-CM

## 2016-08-19 DIAGNOSIS — I5022 Chronic systolic (congestive) heart failure: Secondary | ICD-10-CM | POA: Diagnosis not present

## 2016-08-19 DIAGNOSIS — I2510 Atherosclerotic heart disease of native coronary artery without angina pectoris: Secondary | ICD-10-CM | POA: Diagnosis not present

## 2016-08-19 DIAGNOSIS — E871 Hypo-osmolality and hyponatremia: Secondary | ICD-10-CM | POA: Diagnosis not present

## 2016-08-19 DIAGNOSIS — E782 Mixed hyperlipidemia: Secondary | ICD-10-CM

## 2016-08-19 DIAGNOSIS — F418 Other specified anxiety disorders: Secondary | ICD-10-CM | POA: Diagnosis not present

## 2016-08-19 NOTE — Progress Notes (Signed)
Location:  [facility] Room Number: 808 P Place of Service:  SNF (31)  Provider: Richarda Blade FNP-C   PCP: [facility] Patient Care Team: [facility] as PCP - General (General Practice)  Extended Emergency Contact Information Primary Emergency Contact: Bitterman,Edna Address: [address]          [place], [place] [address] [place] of [place] Home Phone: [phone] Relation: Mother Secondary Emergency Contact: Dorothea Glassman [place] of [place] Mobile Phone: [phone] Relation: Sister  Code Status: Full  code  Goals of care:  Advanced Directive information Advanced Directives 08/19/2016  Does Patient Have a Medical Advance Directive? No  Type of Advance Directive -  Copy of Healthcare Power of Attorney in Chart? -  Would patient like information on creating a medical advance directive? No - Patient declined  Pre-existing out of facility DNR order (yellow form or pink MOST form) -  Some encounter information is confidential and restricted. Go to Review Flowsheets activity to see all data.     No Known Allergies  Chief Complaint  Patient presents with  . Discharge Note    Discharge    HPI:  61 y.o. male seen today at [facility] for discharge home.she was here for short term rehabilitation for post hospital admission from 08/05/2016-08/13/2016 with acute respiratory failure and cardiogenic shock with acute on chronic systolic CHF exacerbation. He required milrinone drip and IV lasix. echocardiogram showed EF 10-15%. He had hyponatremia, acute renal failure and deranged LFT. His lasix was then held and deranged LFT were thought to be from cardiogenic shock. He was seen by cardiology. He has a medical history of HTN, CHF, CAD,Type 2 DM, MI, Depression, Alcohol/Cocaine dependence among other conditions. He is seen in his room today. He denies any acute issues this visit.He has had unremarkable  stay in rehab.  He has worked well with PT/OT now stable for discharge home.He will be discharged home with Home health PT/OT to continue with ROM, Exercise, Gait stability and muscle strengthening. He will require  DME FWW to allow him to maintain current level of independence with ADL's. Home health services will be arranged by facility social worker prior to discharge.He will discharge with medication from the facility. Prescription medication will be written x 1 month then patient to follow up with PCP in 1-2 weeks. Facility staff report no new concerns.  Past Medical History:  Diagnosis Date  . CHF (congestive heart failure) (HCC)   . Coronary artery disease   . Depression   . Diabetes mellitus   . Hypertension   . Mental disorder   . MI (myocardial infarction) (HCC) 24   age 64 per patient    Past Surgical History:  Procedure Laterality Date  . ANGIOPLASTY  1984   at age 48  . LEFT AND RIGHT HEART CATHETERIZATION WITH CORONARY/GRAFT ANGIOGRAM  08/13/2012   Procedure: LEFT AND RIGHT HEART CATHETERIZATION WITH [name];  Surgeon: Kathleene Hazel, MD;  Location: [facility] CATH LAB;  Service: Cardiovascular;;      reports that he has been smoking Cigarettes.  He has been smoking about 0.50 packs per day. He has never used smokeless tobacco. He reports that he uses drugs, including Cocaine and Marijuana, about 7 times per week. He reports that he does not drink alcohol. Social History   Social History  . Marital status: Widowed    Spouse name: N/A  . Number of children: N/A  .  Years of education: N/A   Occupational History  . Not on file.   Social History Main Topics  . Smoking status: Current Every Day Smoker    Packs/day: 0.50    Types: Cigarettes  . Smokeless tobacco: Never Used  . Alcohol use No     Comment: sts he hasn't drunk in 6 months   . Drug use: Yes    Frequency: 7.0 times per week    Types: Cocaine, Marijuana     Comment: last use about 6  months ago  . Sexual activity: Yes   Other Topics Concern  . Not on file   Social History Narrative  . No narrative on file   No Known Allergies  Pertinent  Health Maintenance Due  Topic Date Due  . FOOT EXAM  06/13/1964  . OPHTHALMOLOGY EXAM  06/13/1964  . COLONOSCOPY  06/13/2004  . INFLUENZA VACCINE  11/23/2016  . HEMOGLOBIN A1C  02/04/2017    Medications: Allergies as of 08/19/2016   No Known Allergies     Medication List       Accurate as of 08/19/16 12:45 PM. Always use your most recent med list.          acetaminophen 325 MG tablet Commonly known as:  TYLENOL Take 650 mg by mouth every 6 (six) hours as needed for mild pain.   ALPRAZolam 1 MG tablet Commonly known as:  XANAX Take 1 mg by mouth every 8 (eight) hours as needed for anxiety.   ARIPiprazole 10 MG tablet Commonly known as:  ABILIFY Take 5 mg by mouth daily.   aspirin 81 MG chewable tablet Chew 81 mg by mouth daily.   atorvastatin 20 MG tablet Commonly known as:  LIPITOR Take 20 mg by mouth daily at 6 PM.   buPROPion 100 MG tablet Commonly known as:  WELLBUTRIN Take 100 mg by mouth daily.   digoxin 0.125 MG tablet Commonly known as:  LANOXIN Take 1 tablet (0.125 mg total) by mouth daily.   furosemide 40 MG tablet Commonly known as:  LASIX Take 2 tablets (80 mg total) by mouth daily.   hydrALAZINE 25 MG tablet Commonly known as:  APRESOLINE Take 1 tablet (25 mg total) by mouth every 8 (eight) hours.   insulin glargine 100 UNIT/ML injection Commonly known as:  LANTUS Inject 8 Units into the skin every morning.   isosorbide mononitrate 30 MG 24 hr tablet Commonly known as:  IMDUR Take 1 tablet (30 mg total) by mouth daily.   losartan 50 MG tablet Commonly known as:  COZAAR Take 1 tablet (50 mg total) by mouth daily.   metFORMIN 1000 MG tablet Commonly known as:  GLUCOPHAGE Take 1 tablet (1,000 mg total) by mouth 2 (two) times daily with a meal. For high blood sugar control     multivitamin with minerals Tabs tablet Take 1 tablet by mouth daily.   potassium chloride 10 MEQ tablet Commonly known as:  K-DUR,KLOR-CON Take 2 tablets (20 mEq total) by mouth daily.   sertraline 100 MG tablet Commonly known as:  ZOLOFT Take 100 mg by mouth at bedtime.   traZODone 100 MG tablet Commonly known as:  DESYREL Take 1 tablet (100 mg total) by mouth at bedtime. For sleep       Review of Systems  Constitutional: Negative for activity change, appetite change, chills, fatigue and fever.  HENT: Negative for congestion, rhinorrhea, sinus pain, sinus pressure and sore throat.   Eyes: Negative.   Respiratory: Negative for  cough, chest tightness, shortness of breath and wheezing.   Cardiovascular: Negative for chest pain, palpitations and leg swelling.  Gastrointestinal: Negative for abdominal distention, abdominal pain, constipation, diarrhea, nausea and vomiting.  Endocrine: Negative.   Genitourinary: Negative for dysuria, flank pain, frequency and urgency.  Musculoskeletal: Positive for gait problem.  Skin: Negative for color change, pallor and rash.  Neurological: Negative for dizziness, seizures, syncope, light-headedness and headaches.  Hematological: Does not bruise/bleed easily.  Psychiatric/Behavioral: Negative for agitation, confusion, hallucinations and sleep disturbance. The patient is not nervous/anxious.     Vitals:   08/19/16 1018  BP: 132/89  Pulse: 80  Resp: 18  Temp: (!) 96.1 F (35.6 C)  TempSrc: Oral  SpO2: 95%  Weight: 171 lb (77.6 kg)  Height: 6\' 3"  (1.905 m)   Body mass index is 21.37 kg/m. Physical Exam  Constitutional: He is oriented to person, place, and time. He appears well-developed and well-nourished. No distress.  HENT:  Head: Normocephalic.  Mouth/Throat: Oropharynx is clear and moist. No oropharyngeal exudate.  Eyes: Conjunctivae and EOM are normal. Pupils are equal, round, and reactive to light. Right eye exhibits no  discharge. Left eye exhibits no discharge. No scleral icterus.  Neck: Normal range of motion. No JVD present. No thyromegaly present.  Cardiovascular: Normal rate, regular rhythm, normal heart sounds and intact distal pulses.  Exam reveals no gallop and no friction rub.   No murmur heard. Pulmonary/Chest: Effort normal and breath sounds normal. No respiratory distress. He has no wheezes. He has no rales.  Abdominal: Soft. Bowel sounds are normal. He exhibits no distension. There is no tenderness. There is no rebound and no guarding.  Musculoskeletal: Normal range of motion. He exhibits no edema, tenderness or deformity.  Generalized weakness. Unsteady gait    Lymphadenopathy:    He has no cervical adenopathy.  Neurological: He is oriented to person, place, and time.  Skin: Skin is warm and dry. No rash noted. No erythema. No pallor.  Psychiatric: He has a normal mood and affect.    Labs reviewed: Basic Metabolic Panel:  Recent Labs  98/33/82 0337  08/09/16 0500 08/10/16 0445 08/11/16 0532 08/12/16 0443 08/13/16 0427 08/18/16  NA 133*  < > 124* 126* 126* 126* 125* 131*  K 4.1  < > 3.4* 4.0 3.9 3.9 4.0 4.1  CL 99*  < > 90* 94* 95* 96* 97*  --   CO2 25  < > 24 25 22  20* 21*  --   GLUCOSE 345*  < > 94 123* 88 81 103*  --   BUN 14  < > 29* 18 12 10 8 15   CREATININE 0.98  < > 1.55* 1.20 0.94 0.91 0.90 0.9  CALCIUM 8.0*  < > 7.7* 7.9* 7.8* 7.7* 7.8*  --   MG 2.1  --  1.7 1.8  --   --   --   --   < > = values in this interval not displayed. Liver Function Tests:  Recent Labs  08/10/16 0445 08/11/16 0532 08/12/16 0443 08/18/16  AST 92* 52* 45* 34  ALT 112* 83* 67* 34  ALKPHOS 103 101 100 98  BILITOT 1.4* 1.8* 1.9*  --   PROT 5.7* 5.6* 5.9*  --   ALBUMIN 2.4* 2.3* 2.2*  --     Recent Labs  08/08/16 0955  AMMONIA 25   CBC:  Recent Labs  09/07/15 0805  08/05/16 1921 08/06/16 0103  08/07/16 0918 08/08/16 0332 08/10/16 0445 08/18/16  WBC 6.3  < >  5.3 5.6  < > 7.1  6.6 5.0 4.4  NEUTROABS 3.4  --  3.1 3.8  --   --   --   --   --   HGB 15.2  < > 12.6* 12.6*  < > 12.9* 13.1 11.7* 14.4  HCT 43.2  < > 35.5* 35.0*  < > 35.6* 36.4* 33.5* 42  MCV 85.9  < > 82.9 82.9  < > 82.2 82.9 83.1  --   PLT 209  < > 213 199  < > 204 189 189 248  < > = values in this interval not displayed.   Recent Labs  08/13/16 0307 08/13/16 0726 08/13/16 1121  GLUCAP 107* 98 122*    Procedures and Imaging Studies During Stay: Dg Chest 2 View  Result Date: 08/05/2016 CLINICAL DATA:  Drowsiness/lethargy, shortness of breath. EXAM: CHEST  2 VIEW COMPARISON:  04/12/2016 FINDINGS: Moderate bilateral pleural effusions with bilateral lower lobe airspace opacities, some worsening compared to 04/12/16. Pneumonia cannot be excluded. Faint interstitial accentuation. Prominent retrosternal clear space compatible with emphysema. IMPRESSION: 1. Chronic bilateral pleural effusions and chronic bilateral lower lobe airspace opacities. Pleural effusions seems slightly larger than on 04/12/2016. Electronically Signed   By: Gaylyn Rong M.D.   On: 08/05/2016 20:21   US Abdomen Complete  Result Date: 08/08/2016 CLINICAL DATA:  Elevated LFTs.  Initial encounter. EXAM: ABDOMEN ULTRASOUND COMPLETE COMPARISON:  Abdominal ultrasound performed 02/21/2014, and CT of the abdomen and pelvis performed 10/23/2008 FINDINGS: Gallbladder: No gallstones or wall thickening visualized. No sonographic Murphy sign noted by sonographer. Common bile duct: Diameter: 0.2 cm, within normal limits in caliber. Liver: No focal lesion identified. Within normal limits in parenchymal echogenicity. IVC: No abnormality visualized. Pancreas: Visualized portion unremarkable. Spleen: Size and appearance within normal limits. Right Kidney: Length: 13.2 cm. Echogenicity within normal limits. No mass or hydronephrosis visualized. Left Kidney: Length: 12.2 cm. Echogenicity within normal limits. Left-sided renal pelvicaliectasis is stable  from prior studies. No significant hydronephrosis or mass visualized. Abdominal aorta: No aneurysm visualized. There is obscuration of the mid abdominal aorta and the common iliac arteries overlying structures. Other findings: Small bilateral pleural effusions are seen. IMPRESSION: 1. No acute abnormality seen to explain the patient's symptoms. 2. Small bilateral pleural effusions seen. Electronically Signed   By: Roanna Raider M.D.   On: 08/08/2016 20:51   US Renal  Result Date: 08/07/2016 CLINICAL DATA:  Acute renal failure. EXAM: RENAL / URINARY TRACT ULTRASOUND COMPLETE COMPARISON:  Two-view chest x-ray 08/05/2016 FINDINGS: Right Kidney: Length: 13.0 cm, within normal limits. Echogenicity within normal limits. No mass or hydronephrosis visualized. Left Kidney: Length: 12.9 cm, within normal limits. Echogenicity within normal limits. There is mild dilation of the renal collecting system without significant hydronephrosis. Bladder: Appears normal for degree of bladder distention. Moderate bilateral pleural effusions are noted, right greater left. A small amount of abdominal ascites is present as well. IMPRESSION: 1. Mild dilation of the left renal collecting system without significant hydronephrosis. 2. The right kidney is normal. 3. Moderate bilateral pleural effusions, right greater than left. 4. Small volume ascites. Electronically Signed   By: Marin Roberts M.D.   On: 08/07/2016 11:16   Assessment/Plan:   1. Unsteady gait  Has worked well with PT/ OT. Will discharge home PT/OT to continue with ROM, Exercise, Gait stability and muscle strengthening.He will  require DME FWW to allow him to maintain current level of independence with ADL's. Fall and safety precautions.   2. Generalized muscle weakness  Has improved. Continue with HH: PT/OT to continue with muscle strengthening.  3. Chronic systolic congestive heart failure Status post hospital admission with acute exacerbation. echocardiogram  showed EF 10-15%.appears compensated. Continue on Imdur, cozaar, Hydralazine, furosemide and  Digoxin. Continue on fluid restrictions and daily weight. Follow up with cardioogy as directed.    4. Hypertensive heart disease with chronic systolic congestive heart failure  B/p stable. Continue on  Imdur, cozaar, Hydralazine and furosemide. On potassium supplements. BMP in 1-2 weeks with PCP.   5. Coronary artery disease  Chest pain freee. Continue to control high risk factors.continue on imdur, ASA and Atorvastatin.   6. Type 2 diabetes mellitus with complication, with long-term current use of insulin  CBG's stable. Continue on Lantus 8 units at bedtime and Metformin. Monitor Hgb A1C   7. Hyponatremia Has improved recent level 131 previous was 125. Continue to monitor. BMP in 1-2 weeks with PCP.   8. Depression with anxiety Continue on Wellbutrin, trazodone and Alprazolam. Continue to monitor for mood changes.   9. Mixed hyperlipidemia Continue on Atorvastatin. Monitor Lipid panel periodically.   Patient is being discharged with the following home health services:   -PT/OT for ROM, exercise, gait stability and muscle strengthening  Patient is being discharged with the following durable medical equipment:    FWW  to allow him to maintain current level of independence.  Patient has been advised to f/u with their PCP in 1-2 weeks to for a transitions of care visit.Social services at their facility was responsible for arranging this appointment.  Pt was provided with adequate prescriptions of noncontrolled medications to reach the scheduled appointment.For controlled substances, a limited supply was provided as appropriate for the individual patient. If the pt normally receives these medications from a pain clinic or has a contract with another physician, these medications should be received from that clinic or physician only).    Future labs/tests needed:  CBC, BMP in 1-2 weeks PCP

## 2016-08-22 ENCOUNTER — Inpatient Hospital Stay (HOSPITAL_COMMUNITY): Admit: 2016-08-22 | Payer: Self-pay

## 2016-08-23 DIAGNOSIS — I110 Hypertensive heart disease with heart failure: Secondary | ICD-10-CM | POA: Diagnosis not present

## 2016-08-23 DIAGNOSIS — I5043 Acute on chronic combined systolic (congestive) and diastolic (congestive) heart failure: Secondary | ICD-10-CM | POA: Diagnosis not present

## 2016-08-23 DIAGNOSIS — I2510 Atherosclerotic heart disease of native coronary artery without angina pectoris: Secondary | ICD-10-CM | POA: Diagnosis not present

## 2016-08-23 DIAGNOSIS — Z794 Long term (current) use of insulin: Secondary | ICD-10-CM | POA: Diagnosis not present

## 2016-08-23 DIAGNOSIS — E1165 Type 2 diabetes mellitus with hyperglycemia: Secondary | ICD-10-CM | POA: Diagnosis not present

## 2016-08-23 DIAGNOSIS — R2681 Unsteadiness on feet: Secondary | ICD-10-CM | POA: Diagnosis not present

## 2016-08-24 ENCOUNTER — Other Ambulatory Visit (HOSPITAL_COMMUNITY): Payer: Self-pay

## 2016-08-24 NOTE — Progress Notes (Signed)
Paramedicine Encounter    Patient ID: [name], male    DOB: [birth year], 61 y.o.   MRN: [id number]   Patient Care Team: [facility] as PCP - General (General Practice)  Patient Active Problem List   Diagnosis Date Noted  . Palliative care by specialist   . Goals of care, counseling/discussion   . Advance care planning   . Anxiety state   . Elevated LFTs   . ARF (acute renal failure) (HCC)   . Depression with anxiety 08/05/2016  . Malnutrition of moderate degree 04/12/2016  . Type 2 diabetes mellitus with hyperglycemia, with long-term current use of insulin (HCC) 04/12/2016  . Cough   . DNR (do not resuscitate) discussion   . Palliative care encounter   . Polysubstance abuse   . Pleural effusion   . Insulin dependent diabetes mellitus (HCC)   . Hepatitis, viral   . Pleural effusion on right   . CHF (congestive heart failure) (HCC) 09/07/2015  . Elevated troponin I level 03/10/2015  . Alcohol withdrawal (HCC)   . Type 2 diabetes mellitus with complication (HCC)   . 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 Alcohol abuse with intoxication (HCC)   . Hypertensive heart disease with chronic systolic congestive heart failure (HCC)   . 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 Alcohol dependence with withdrawal delirium (HCC) 02/23/2014  . Hyponatremia 02/23/2014  . Transaminitis 02/22/2014  . Protein-calorie malnutrition (HCC) 02/22/2014  . Other pancytopenia (HCC) 02/22/2014  . Hyperkalemia 02/22/2014  . Hypomagnesemia 02/22/2014  . Alcoholic ketoacidosis 02/20/2014  . ETOH abuse 12/21/2012  . Cocaine dependence (HCC) 12/21/2012  . Substance induced mood disorder (HCC) 12/21/2012  . Non Obstructive CAD (coronary artery disease) 08/14/2012  . Cardiomyopathy, nonischemic (HCC) 08/14/2012  . Shortness of breath 08/11/2012  . Diabetes mellitus, type II (HCC) 08/11/2012  . History of substance abuse 08/11/2012  . Orthopnea 08/11/2012    Current  Outpatient Prescriptions:  .  acetaminophen (TYLENOL) 325 MG tablet, Take 650 mg by mouth every 6 (six) hours as needed for mild pain., Disp: , Rfl:  .  ARIPiprazole (ABILIFY) 10 MG tablet, Take 5 mg by mouth daily., Disp: , Rfl:  .  atorvastatin (LIPITOR) 20 MG tablet, Take 20 mg by mouth daily at 6 PM., Disp: , Rfl:  .  buPROPion (WELLBUTRIN) 100 MG tablet, Take 100 mg by mouth daily. , Disp: , Rfl:  .  digoxin (LANOXIN) 0.125 MG tablet, Take 1 tablet (0.125 mg total) by mouth daily., Disp: 30 tablet, Rfl: 0 .  furosemide (LASIX) 40 MG tablet, Take 2 tablets (80 mg total) by mouth daily., Disp: 30 tablet, Rfl: 0 .  hydrALAZINE (APRESOLINE) 25 MG tablet, Take 1 tablet (25 mg total) by mouth every 8 (eight) hours., Disp: 60 tablet, Rfl: 0 .  insulin glargine (LANTUS) 100 UNIT/ML injection, Inject 8 Units into the skin every morning., Disp: , Rfl:  .  isosorbide mononitrate (IMDUR) 30 MG 24 hr tablet, Take 1 tablet (30 mg total) by mouth daily., Disp: 30 tablet, Rfl: 0 .  losartan (COZAAR) 50 MG tablet, Take 1 tablet (50 mg total) by mouth daily., Disp: 30 tablet, Rfl: 0 .  metFORMIN (GLUCOPHAGE) 1000 MG tablet, Take 1 tablet (1,000 mg total) by mouth 2 (two) times daily with a meal. For high blood sugar control, Disp: , Rfl:  .  Multiple Vitamin (MULTIVITAMIN WITH MINERALS) TABS tablet, Take 1 tablet by mouth daily., Disp: , Rfl:  .  potassium chloride (  K-DUR,KLOR-CON) 10 MEQ tablet, Take 2 tablets (20 mEq total) by mouth daily., Disp: 30 tablet, Rfl: 0 .  sertraline (ZOLOFT) 100 MG tablet, Take 100 mg by mouth at bedtime. , Disp: , Rfl:  .  traZODone (DESYREL) 100 MG tablet, Take 1 tablet (100 mg total) by mouth at bedtime. For sleep, Disp: 30 tablet, Rfl: 0 .  ALPRAZolam (XANAX) 1 MG tablet, Take 1 mg by mouth every 8 (eight) hours as needed for anxiety., Disp: , Rfl:  .  aspirin 81 MG chewable tablet, Chew 81 mg by mouth daily., Disp: , Rfl:  No Known Allergies   Social History   Social  History  . Marital status: Widowed    Spouse name: N/A  . Number of children: N/A  . Years of education: N/A   Occupational History  . Not on file.   Social History Main Topics  . Smoking status: Current Every Day Smoker    Packs/day: 0.50    Types: Cigarettes  . Smokeless tobacco: Never Used  . Alcohol use No     Comment: sts he hasn't drunk in 6 months   . Drug use: Yes    Frequency: 7.0 times per week    Types: Cocaine, Marijuana     Comment: last use about 6 months ago  . Sexual activity: Yes   Other Topics Concern  . Not on file   Social History Narrative  . No narrative on file    Physical Exam      SAFE - 08/24/16 1600      Situation   Admitting diagnosis chf   Heart failure history Exisiting   Comorbidities DM;HTN;Hx MI/CAD   Readmitted within 30 days Yes     Assessment   Lives alone No  lives with mom    Primary support person mom   Mode of transportation needs assistance   Assistance needed --  transportation   Other services involved Home Health;[facility]   Home equipement Cane     Weight   Weighs self daily No   Scale provided Yes   Records on weight chart No     Resources   Has "Living better w/heart failure" book No   Has HF Zone tool No   Able to identify yellow zone signs/when to call MD No   Records zone daily No     Medications   Uses a pill box No   Difficulty obtaining medications No   Mail order medications Yes   New medications at home today No   Missed one or more doses of medications per week No     Nutrition   Patient receives meals on wheels No   Patient follows low sodium diet Yes   Has foods at home that meet the current recommended diet No   Patient follows low sugar/card diet No   Nutritional concerns/issues no     Activity Level   ADL's/Mobility Independent     Urine   Difficulty urinating No   Changes in urine None         Scientist, research (medical) Living Environment - 08/24/16 1600      Outside of House    Sidewalk and pathway to house is level and free from any hazards Yes   Driveway is free from debris/snow/ice Yes   Outside stairs are stable and have sturdy handrail Yes   Porch lights are working and provide adequate lighting Yes     Living Room   Furniture is of adequate  height and offers arm rests that assist in getting up and down Yes   Floor is free from any clutter that would create tripping hazards No   All cords are either behind furniture or secured in a manner that does not cause trip hazards No   All rugs are secured to floor with double-sided tape N/A   Lighting is adequate to light room Yes   All lighting has an easily accessible on/off switch Yes   Phone is readily accessible near favorite seating areas Yes   Emergency numbers are printed near all phones in house Yes     Kitchen   Items used most often are within easy reach on low shelves Yes   Step stool is present, is sturdy and has a handrail N/A   Floor mats are non-slip tread and secured to floor Yes   Oven controls are within easy reach Yes   Kitchen lighting is adequate and easy to reach switches Yes   ABC fire extinguisher is located in kitchen Yes     Stairs   Carpet is properly secured to stairs and/or all wood is properly secured N/A   Handrail is present and sturdy N/A   Stairs are free from any clutter N/A   Stairway is adequately lit N/A     Bathroom   Tub and shower have a non-slip surface Yes   Tub and/or shower have a grab bar for stability Yes   Toilet has a raised seat Yes   Grab bar is attached near toilet for assistance Yes   Pathway from bedroom to bathroom is free from clutter and well lit for ease of movement in the middle of the night Yes     Bedroom   Floor is free from clutter No   Light is near bed and is easy to turn on Yes   Phone is next to bed and within easy reach Yes   Flashlight is near bed in case of emergency Yes     General   Smoke detectors in all areas of the house (each  floor) and tested Yes   CO detectors on each floor of house and tested No   Flashlights are handy throughout the home Yes   Resident has all medical information readily available and in an area emergency providers will easily find Yes   All heaters are away from any type of flammable material N/A     Overall Tips   Homeowner ha good non-skid shoes to move around house Yes   All assisted walking devices are readily accessible and in good condition Yes   There is a phone near the floor for ease of reach in case of a fall YES   All O2 tubing is less than 50 ft. and is not a trip hazard N/A   Resident has had an annual hearing and vision check by a physician Yes   Resident has the proper hearing and visual aids prescribed and are in good working order Yes   All medications are properly stored and labeled to avoid confusion on dosage, time to take, and avoidance of missed doses Yes       No future appointments. BP 118/64   Pulse 92   Resp 16   SpO2 99%  Weight yesterday- Last visit weight- [facility]-98  This is first visit with pt--he reports he lives with his mom--verified all meds-he has his pills in a punch pack of 90 day supply-he states he got them  from the hosp before he left- once these run out [facility]. He does not have the xanax and the asa. He reports he has issues with transportation-he states he has disability income, isnt sure if he has medicaid or medicare. According to paperwork he is medicare. Will still look into medicaid. He does not have scales-left him scales and calendar today and advised him the importance to weigh daily. He states he was not familiar with the daily zones and did not have the booklet, so that was left for him also. His meds will be free once they start coming from [place] and it is mailed to him.  He reports he tries to follow low sodium. States he eats fish, cereal, eggs, no added salt or sugar, advised him  importance of reading the label and paying attention. He reports what led him to have to go in hosp this last time was b/c he ate what he wasn't suppose to and wasn't taking his meds. He says he is able to walk approx 1-2 blocks without getting sob.  He says he has cut back on his fluid intake. Advised him of the 2L limit daily.  Pt denies any sob, dizziness if he gets up too fast, pt denies h/a. No c/p. He has not been weighing as he has not had any scales-advised him of the importance of weighing daily.   ACTION: Home visit completed  Kerry Hough, EMT-Paramedic 08/24/16

## 2016-08-29 DIAGNOSIS — E1165 Type 2 diabetes mellitus with hyperglycemia: Secondary | ICD-10-CM | POA: Diagnosis not present

## 2016-08-29 DIAGNOSIS — I2510 Atherosclerotic heart disease of native coronary artery without angina pectoris: Secondary | ICD-10-CM | POA: Diagnosis not present

## 2016-08-29 DIAGNOSIS — I5043 Acute on chronic combined systolic (congestive) and diastolic (congestive) heart failure: Secondary | ICD-10-CM | POA: Diagnosis not present

## 2016-08-29 DIAGNOSIS — Z794 Long term (current) use of insulin: Secondary | ICD-10-CM | POA: Diagnosis not present

## 2016-08-29 DIAGNOSIS — I110 Hypertensive heart disease with heart failure: Secondary | ICD-10-CM | POA: Diagnosis not present

## 2016-08-29 DIAGNOSIS — R2681 Unsteadiness on feet: Secondary | ICD-10-CM | POA: Diagnosis not present

## 2016-09-06 ENCOUNTER — Telehealth (HOSPITAL_COMMUNITY): Payer: Self-pay | Admitting: Surgery

## 2016-09-06 ENCOUNTER — Other Ambulatory Visit (HOSPITAL_COMMUNITY): Payer: Self-pay

## 2016-09-06 NOTE — Telephone Encounter (Signed)
I spoke with SW at [facility] and she tells me that [name] was referred to [facility] for nursing services after discharge from SNF.  She says that she is unaware if they have engaged with patient or not.  I will make a call to [facility] to inquire and if they are not engaging patient I will be sure he gets referral to another [facility] agency.

## 2016-09-06 NOTE — Telephone Encounter (Signed)
I received a call from HF Community Paramedic Katie Lynch--She indicates that patient has been discharged from [facility] and that he was not referred for [facility] at the time of discharge.  He also missed his F/U appt with the [facility] while in rehab facility.  We have rescheduled his appt for tomorrow 5/16 at 2:00pm and have also scheduled cab pick for him so that he has transportation to appt.  I have placed a call to [facility] SW so that he can be referred for [facility] going forward.  He does have some sores on his back side that he tells her happened while he was in the rehab facility.

## 2016-09-07 ENCOUNTER — Encounter (HOSPITAL_COMMUNITY): Payer: Self-pay | Admitting: *Deleted

## 2016-09-07 ENCOUNTER — Emergency Department (HOSPITAL_COMMUNITY)
Admission: EM | Admit: 2016-09-07 | Discharge: 2016-09-07 | Disposition: A | Discharge status: Home / Self Care | Payer: Medicare Other | Attending: Emergency Medicine | Admitting: Emergency Medicine

## 2016-09-07 ENCOUNTER — Ambulatory Visit (HOSPITAL_COMMUNITY)
Admission: RE | Admit: 2016-09-07 | Discharge: 2016-09-07 | Disposition: A | Discharge status: Home / Self Care | Payer: Medicare Other | Source: Ambulatory Visit | Attending: Internal Medicine | Admitting: Internal Medicine

## 2016-09-07 ENCOUNTER — Encounter (HOSPITAL_COMMUNITY): Payer: Self-pay

## 2016-09-07 VITALS — BP 110/66 | HR 86 | Wt 146.0 lb

## 2016-09-07 DIAGNOSIS — Z79899 Other long term (current) drug therapy: Secondary | ICD-10-CM | POA: Diagnosis not present

## 2016-09-07 DIAGNOSIS — F1910 Other psychoactive substance abuse, uncomplicated: Secondary | ICD-10-CM | POA: Diagnosis not present

## 2016-09-07 DIAGNOSIS — Z794 Long term (current) use of insulin: Secondary | ICD-10-CM | POA: Diagnosis not present

## 2016-09-07 DIAGNOSIS — F1010 Alcohol abuse, uncomplicated: Secondary | ICD-10-CM | POA: Insufficient documentation

## 2016-09-07 DIAGNOSIS — Z452 Encounter for adjustment and management of vascular access device: Secondary | ICD-10-CM | POA: Diagnosis not present

## 2016-09-07 DIAGNOSIS — I110 Hypertensive heart disease with heart failure: Secondary | ICD-10-CM | POA: Diagnosis not present

## 2016-09-07 DIAGNOSIS — I428 Other cardiomyopathies: Secondary | ICD-10-CM | POA: Diagnosis not present

## 2016-09-07 DIAGNOSIS — Z4801 Encounter for change or removal of surgical wound dressing: Secondary | ICD-10-CM | POA: Insufficient documentation

## 2016-09-07 DIAGNOSIS — F1410 Cocaine abuse, uncomplicated: Secondary | ICD-10-CM | POA: Insufficient documentation

## 2016-09-07 DIAGNOSIS — B1920 Unspecified viral hepatitis C without hepatic coma: Secondary | ICD-10-CM | POA: Insufficient documentation

## 2016-09-07 DIAGNOSIS — L0231 Cutaneous abscess of buttock: Secondary | ICD-10-CM | POA: Insufficient documentation

## 2016-09-07 DIAGNOSIS — Z8249 Family history of ischemic heart disease and other diseases of the circulatory system: Secondary | ICD-10-CM | POA: Diagnosis not present

## 2016-09-07 DIAGNOSIS — E119 Type 2 diabetes mellitus without complications: Secondary | ICD-10-CM | POA: Diagnosis not present

## 2016-09-07 DIAGNOSIS — R768 Other specified abnormal immunological findings in serum: Secondary | ICD-10-CM

## 2016-09-07 DIAGNOSIS — I5022 Chronic systolic (congestive) heart failure: Secondary | ICD-10-CM | POA: Insufficient documentation

## 2016-09-07 DIAGNOSIS — I429 Cardiomyopathy, unspecified: Secondary | ICD-10-CM | POA: Diagnosis not present

## 2016-09-07 DIAGNOSIS — F329 Major depressive disorder, single episode, unspecified: Secondary | ICD-10-CM | POA: Diagnosis not present

## 2016-09-07 DIAGNOSIS — Z9119 Patient's noncompliance with other medical treatment and regimen: Secondary | ICD-10-CM | POA: Diagnosis not present

## 2016-09-07 DIAGNOSIS — Z7982 Long term (current) use of aspirin: Secondary | ICD-10-CM | POA: Insufficient documentation

## 2016-09-07 DIAGNOSIS — E118 Type 2 diabetes mellitus with unspecified complications: Secondary | ICD-10-CM

## 2016-09-07 DIAGNOSIS — I2510 Atherosclerotic heart disease of native coronary artery without angina pectoris: Secondary | ICD-10-CM | POA: Insufficient documentation

## 2016-09-07 DIAGNOSIS — F17210 Nicotine dependence, cigarettes, uncomplicated: Secondary | ICD-10-CM | POA: Diagnosis not present

## 2016-09-07 DIAGNOSIS — I252 Old myocardial infarction: Secondary | ICD-10-CM | POA: Diagnosis not present

## 2016-09-07 DIAGNOSIS — E1100 Type 2 diabetes mellitus with hyperosmolarity without nonketotic hyperglycemic-hyperosmolar coma (NKHHC): Secondary | ICD-10-CM | POA: Diagnosis not present

## 2016-09-07 DIAGNOSIS — Z833 Family history of diabetes mellitus: Secondary | ICD-10-CM | POA: Insufficient documentation

## 2016-09-07 DIAGNOSIS — I509 Heart failure, unspecified: Secondary | ICD-10-CM | POA: Diagnosis not present

## 2016-09-07 DIAGNOSIS — L0291 Cutaneous abscess, unspecified: Secondary | ICD-10-CM

## 2016-09-07 MED ORDER — IBUPROFEN 600 MG PO TABS: 600.0000 mg | ORAL_TABLET | Freq: Four times a day (QID) | ORAL | 0 refills | Status: DC | PRN

## 2016-09-07 MED ORDER — SULFAMETHOXAZOLE-TRIMETHOPRIM 800-160 MG PO TABS
1.0000 | ORAL_TABLET | Freq: Once | ORAL | Status: AC
  Administered 2016-09-07: 1 via ORAL
  Filled 2016-09-07: qty 1

## 2016-09-07 MED ORDER — SULFAMETHOXAZOLE-TRIMETHOPRIM 800-160 MG PO TABS: 1.0000 | ORAL_TABLET | Freq: Two times a day (BID) | ORAL | 0 refills | Status: AC

## 2016-09-07 MED ORDER — IBUPROFEN 400 MG PO TABS
600.0000 mg | ORAL_TABLET | Freq: Once | ORAL | Status: AC
  Administered 2016-09-07: 600 mg via ORAL
  Filled 2016-09-07: qty 1

## 2016-09-07 MED ORDER — LIDOCAINE HCL (PF) 1 % IJ SOLN
2.0000 mL | Freq: Once | INTRAMUSCULAR | Status: AC
  Administered 2016-09-07: 2 mL
  Filled 2016-09-07: qty 5

## 2016-09-07 NOTE — Progress Notes (Signed)
Advanced Heart Failure Clinic Note   Primary Care: None Primary Cardiologist: Dr. Shirlee Latch   HPI:  [name] is a 61 y.o. male with h/o of NICM, Combined CHF, HTN, DM, CAD, ETOH abuse, and cocaine abuse.   Admitted 4/13 -> 08/13/16 with A/C systolic CHF. PTA had been out of HF meds for at least 3 months and was drinking at least three 40 oz bees per day along with frequent cocaine use. UDS + for cocaine. PICC placed and initial mixed venous sat 40%. Stared on milrinone and diuresed. Weaned off inotropes as not candidate for home with ongoing substance abuse. Discharged to SNF for rehab.   Pt presents today for post hospital follow up. Missed initial appointment. Taxi arranged for today. Weight is down 17 lbs from discharge. He states his car is not running currently.  Denies DOE walking around the house.  At times wakes up at night SOB and also has orthopnea. Lives at home with his mom. She is 89 and does not drive either.  Misses medication 2-3 times a week, has been better since Paramedicine has started and using pill box. Denies any ETOH or cocaine since discharge. Down to 1-2 cigarettes a day. He has a very sore area on his Right buttock. Thinks it is a bed sore. Denies fevers, chills, N/V or diarrhea.   Echo 4/15/208 LVEF 10-15%, PA peak pressure 69 mm hg, RV mildly dilated. Trivial pericardial effusion. L pleural effusion.  Review of systems complete and found to be negative unless listed in HPI.    Past Medical History 1. Chronic systolic CHF  - Echo 01/2014 LVEF 25% - Echo 4/15/208 LVEF 10-15%, PA peak pressure 69 mm hg, RV mildly dilated. Trivial pericardial effusion. L pleural effusion. - NICM. Likely ETOH related with extremely high intake (upwards of 120 oz a day at one point) 2. ETOH abuse - Excessive. Was at one point drinking upwards of three 40 oz beers a day.  3. Substance Abuse - Cocaine positive last admission 4. Tobacco abuse - Smoking 1/2 pack a day 5. CAD  -  Non obstructive via cath 2014  Past Medical History:  Diagnosis Date  . CHF (congestive heart failure) (HCC)   . Coronary artery disease   . Depression   . Diabetes mellitus   . Hypertension   . Mental disorder   . MI (myocardial infarction) (HCC) 38   age 40 per patient    Current Outpatient Prescriptions  Medication Sig Dispense Refill  . acetaminophen (TYLENOL) 325 MG tablet Take 650 mg by mouth every 6 (six) hours as needed for mild pain.    Marland Kitchen ALPRAZolam (XANAX) 1 MG tablet Take 1 mg by mouth every 8 (eight) hours as needed for anxiety.    . ARIPiprazole (ABILIFY) 10 MG tablet Take 5 mg by mouth daily.    Marland Kitchen aspirin 81 MG chewable tablet Chew 81 mg by mouth daily.    Marland Kitchen atorvastatin (LIPITOR) 20 MG tablet Take 20 mg by mouth daily at 6 PM.    . buPROPion (WELLBUTRIN) 100 MG tablet Take 100 mg by mouth daily.     . digoxin (LANOXIN) 0.125 MG tablet Take 1 tablet (0.125 mg total) by mouth daily. 30 tablet 0  . furosemide (LASIX) 40 MG tablet Take 2 tablets (80 mg total) by mouth daily. 30 tablet 0  . hydrALAZINE (APRESOLINE) 25 MG tablet Take 1 tablet (25 mg total) by mouth every 8 (eight) hours. 60 tablet 0  . insulin  glargine (LANTUS) 100 UNIT/ML injection Inject 8 Units into the skin every morning.    . isosorbide mononitrate (IMDUR) 30 MG 24 hr tablet Take 1 tablet (30 mg total) by mouth daily. 30 tablet 0  . losartan (COZAAR) 50 MG tablet Take 1 tablet (50 mg total) by mouth daily. 30 tablet 0  . metFORMIN (GLUCOPHAGE) 1000 MG tablet Take 1 tablet (1,000 mg total) by mouth 2 (two) times daily with a meal. For high blood sugar control    . potassium chloride (K-DUR,KLOR-CON) 10 MEQ tablet Take 2 tablets (20 mEq total) by mouth daily. 30 tablet 0  . sertraline (ZOLOFT) 100 MG tablet Take 100 mg by mouth at bedtime.     . traZODone (DESYREL) 100 MG tablet Take 1 tablet (100 mg total) by mouth at bedtime. For sleep (Patient not taking: Reported on 09/06/2016) 30 tablet 0   No  current facility-administered medications for this encounter.     No Known Allergies    Social History   Social History  . Marital status: Widowed    Spouse name: N/A  . Number of children: N/A  . Years of education: N/A   Occupational History  . Not on file.   Social History Main Topics  . Smoking status: Current Every Day Smoker    Packs/day: 0.50    Types: Cigarettes  . Smokeless tobacco: Never Used  . Alcohol use No     Comment: sts he hasn't drunk in 6 months   . Drug use: Yes    Frequency: 7.0 times per week    Types: Cocaine, Marijuana     Comment: last use about 6 months ago  . Sexual activity: Yes   Other Topics Concern  . Not on file   Social History Narrative  . No narrative on file      Family History  Problem Relation Age of Onset  . Heart attack Father        >60s  . Hypertension Father   . Diabetes Father   . Heart attack Sister   . Hypertension Mother     Vitals:   09/07/16 1507  BP: 110/66  Pulse: 86  SpO2: 98%  Weight: 146 lb (66.2 kg)   Wt Readings from Last 3 Encounters:  09/07/16 146 lb (66.2 kg)  09/06/16 147 lb (66.7 kg)  08/19/16 171 lb (77.6 kg)     PHYSICAL EXAM: General:  Elderly and fatigued appearing. NAD.  HEENT: Normal Neck: supple. JVP 6-7 cm. Carotids 2+ bilat; no bruits. No lymphadenopathy or thyromegaly appreciated. Cor: PMI nondisplaced. Regular rate & rhythm. +S3 Lungs: Clear Abdomen: soft, nontender, nondistended. No hepatosplenomegaly. No bruits or masses. Good bowel sounds. Extremities: no cyanosis, clubbing, rash, edema Neuro: alert & oriented x 3, cranial nerves grossly intact. moves all 4 extremities w/o difficulty. Affect pleasant. Skin: 3-4 cm indurated area on upper right buttock. Slight foul smell.   ECG: Sinus Rhythm 62 bpm (Personally reviewed from 08/05/16)  ASSESSMENT & PLAN:  1. Chronic systolic CHF with recent admission for cardiogenic shock  - Echo 4/15/208 LVEF 10-15%, PA peak pressure  69 mm hg, RV mildly dilated. Trivial pericardial effusion. L pleural effusion. - Volume status looks stable on exam. NYHA Class III symptoms - Continue lasix 80 mg daily.  - Continue potassium 20 meq daily - Continue hydralazine 25 mg TID  - Continue imdur 30 mg daily.  - Continue losartan 50 mg daily. - Continue digoxin 0.125 mg daily.  - Reinforced fluid  restriction to < 2 L daily, sodium restriction to less than 2000 mg daily, and the importance of daily weights.   2. CAD  - Non obstructive on LHC 2014 - No ACS symptoms.  3. ETOH abuse - He states he has been abstinent since discharge. Congratulated and encouraged to continue.  4. Substance Abuse - States he has remained abstinent from cocaine and any other substance since discharge. Congratulated and encouraged to continue.  5. Tobacco Abuse - Down to 1-2 cigarettes a day. Encouraged to stop completely.  6. Non-compliance - Improved with paramedicine. 7. Buttock abscess - Area of induration very concerning for abscess. Urged patient to report to ED after this visit.  Suspect will need I&D and course of ABX.  8. Hepatitis C - + antibody in 2015, and quantitative labs drawn in 2017.  Has had poor follow up. Will need to establish with PCP and likely GI.   HFCSW and nurse navigator to see today. Relatively stable from HF perspective. Continue paramedicine. Should have labs drawn in ED with abscess. Will keep close 2 week follow up. Will likely need transport arranged.   Graciella Freer, PA-C 09/07/16   Greater than 50% of the 25 minute visit was spent in counseling/coordination of care regarding disease state education, social work, Statistician, medication compliance, salt/fluid restriction, and substance abuse.

## 2016-09-07 NOTE — Discharge Instructions (Signed)
Tonight your abscess was opened and drained.  Please apply warm compress to it 3-4 times a day for the next several days to facilitate continued drainage.  He also been given antibiotic.  Please take this as directed until all tablets have been completed. You have been given a referral to community wellness, to help you establish local medical care

## 2016-09-07 NOTE — ED Notes (Signed)
Pt stable, understands discharge instructions, and reasons for return.   

## 2016-09-07 NOTE — Progress Notes (Signed)
Patient Outreach Open    09/06/2016 [facility]  Kerry Hough, EMT  Emergency Medicine   Congestive Heart Failure  Reason for Visit   Conversation: Congestive Heart Failure  (Newest Message First)  Me      09/06/16 10:59 AM  Incomplete Note    Paramedicine Encounter    Patient ID: [name], male    DOB: [birth year], 61 y.o.   MRN: [id number]   [facility] Care Team: [facility] as [facility]CP - General (General Practice)      Patient Active Problem List   Diagnosis Date Noted  . Palliative care by specialist   . Goals of care, counseling/discussion   . Advance care planning   . Anxiety state   . Elevated LFTs   . ARF (acute renal failure) (HCC)   . Depression with anxiety 08/05/2016  . Malnutrition of moderate degree 04/12/2016  . Type 2 diabetes mellitus with hyperglycemia, with long-term current use of insulin (HCC) 04/12/2016  . Cough   . DNR (do not resuscitate) discussion   . Palliative care encounter   . Polysubstance abuse   . Pleural effusion   . Insulin dependent diabetes mellitus (HCC)   . Hepatitis, viral   . Pleural effusion on right   . CHF (congestive heart failure) (HCC) 09/07/2015  . Elevated troponin I level 03/10/2015  . Alcohol withdrawal (HCC)   . Type 2 diabetes mellitus with complication (HCC)   . 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 Alcohol abuse with intoxication (HCC)   . Hypertensive heart disease with chronic systolic congestive heart failure (HCC)   . 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 Alcohol dependence with withdrawal delirium (HCC) 02/23/2014  . Hyponatremia 02/23/2014  . Transaminitis 02/22/2014  . Protein-calorie malnutrition (HCC) 02/22/2014  . Other pancytopenia (HCC) 02/22/2014  . Hyperkalemia 02/22/2014  . Hypomagnesemia 02/22/2014  . Alcoholic ketoacidosis 02/20/2014  . ETOH abuse 12/21/2012  . Cocaine dependence (HCC) 12/21/2012  .  Substance induced mood disorder (HCC) 12/21/2012  . Non Obstructive CAD (coronary artery disease) 08/14/2012  . Cardiomyopathy, nonischemic (HCC) 08/14/2012  . Shortness of breath 08/11/2012  . Diabetes mellitus, type II (HCC) 08/11/2012  . History of substance abuse 08/11/2012  . Orthopnea 08/11/2012    Current Outpatient Prescriptions:  .  acetaminophen (TYLENOL) 325 MG tablet, Take 650 mg by mouth every 6 (six) hours as needed for mild pain., Disp: , Rfl:  .  ARIPiprazole (ABILIFY) 10 MG tablet, Take 5 mg by mouth daily., Disp: , Rfl:  .  atorvastatin (LIPITOR) 20 MG tablet, Take 20 mg by mouth daily at 6 PM., Disp: , Rfl:  .  buPROPion (WELLBUTRIN) 100 MG tablet, Take 100 mg by mouth daily. , Disp: , Rfl:  .  digoxin (LANOXIN) 0.125 MG tablet, Take 1 tablet (0.125 mg total) by mouth daily., Disp: 30 tablet, Rfl: 0 .  furosemide (LASIX) 40 MG tablet, Take 2 tablets (80 mg total) by mouth daily., Disp: 30 tablet, Rfl: 0 .  hydrALAZINE (APRESOLINE) 25 MG tablet, Take 1 tablet (25 mg total) by mouth every 8 (eight) hours., Disp: 60 tablet, Rfl: 0 .  insulin glargine (LANTUS) 100 UNIT/ML injection, Inject 8 Units into the skin every morning., Disp: , Rfl:  .  isosorbide mononitrate (IMDUR) 30 MG 24 hr tablet, Take 1 tablet (30 mg total) by mouth daily., Disp: 30 tablet, Rfl: 0 .  losartan (COZAAR) 50 MG tablet, Take 1 tablet (50 mg total) by  mouth daily., Disp: 30 tablet, Rfl: 0 .  metFORMIN (GLUCOPHAGE) 1000 MG tablet, Take 1 tablet (1,000 mg total) by mouth 2 (two) times daily with a meal. For high blood sugar control, Disp: , Rfl:  .  potassium chloride (K-DUR,KLOR-CON) 10 MEQ tablet, Take 2 tablets (20 mEq total) by mouth daily., Disp: 30 tablet, Rfl: 0 .  sertraline (ZOLOFT) 100 MG tablet, Take 100 mg by mouth at bedtime. , Disp: , Rfl:  .  ALPRAZolam (XANAX) 1 MG tablet, Take 1 mg by mouth every 8 (eight) hours as needed for anxiety., Disp: , Rfl:  .  aspirin 81 MG chewable tablet,  Chew 81 mg by mouth daily., Disp: , Rfl:  .  Multiple Vitamin (MULTIVITAMIN WITH MINERALS) TABS tablet, Take 1 tablet by mouth daily. (Patient not taking: Reported on 09/06/2016), Disp: , Rfl:  .  traZODone (DESYREL) 100 MG tablet, Take 1 tablet (100 mg total) by mouth at bedtime. For sleep (Patient not taking: Reported on 09/06/2016), Disp: 30 tablet, Rfl: 0 No Known Allergies   Social History        Social History  . Marital status: Widowed    Spouse name: N/A  . Number of children: N/A  . Years of education: N/A      Occupational History  . Not on file.         Social History Main Topics  . Smoking status: Current Every Day Smoker    Packs/day: 0.50    Types: Cigarettes  . Smokeless tobacco: Never Used  . Alcohol use No     Comment: sts he hasn't drunk in 6 months   . Drug use: Yes    Frequency: 7.0 times per week    Types: Cocaine, Marijuana     Comment: last use about 6 months ago  . Sexual activity: Yes       Other Topics Concern  . Not on file      Social History Narrative  . No narrative on file    Physical Exam              SAFE - 08/24/16 1600            Situation   Admitting diagnosis chf   Heart failure history Exisiting   Comorbidities DM;HTN;Hx MI/CAD   Readmitted within 30 days Yes       Assessment   Lives alone No  lives with mom    Primary support person mom   Mode of transportation needs assistance   Assistance needed --  transportation   Other services involved Home Health;[facility]   Home equipement Cane       Weight   Weighs self daily No   Scale provided Yes   Records on weight chart No       Resources   Has "Living better w/heart failure" book No   Has HF Zone tool No   Able to identify yellow zone signs/when to call MD No   Records zone daily No       Medications   Uses a pill box No   Difficulty obtaining medications No   Mail order medications Yes   New  medications at home today No   Missed one or more doses of medications per week No       Nutrition   Patient receives meals on wheels No   Patient follows low sodium diet Yes   Has foods at home that meet the current recommended diet No  Patient follows low sugar/card diet No   Nutritional concerns/issues no       Activity Level   ADL's/Mobility Independent       Urine   Difficulty urinating No   Changes in urine None      Future Appointments Date Time Provider Department Center  09/07/2016 2:00 PM [facility] PA/NP [facility] None   BP 124/72   Pulse 90   Resp 16   Wt 147 lb (66.7 kg)   SpO2 98%   BMI 18.37 kg/m  Weight yesterday-?? Last visit weight-?? [id number]  Pt reports that he is not doing well today, he reports nobody has came out to look at his sores on his back and bottom yet. He has not been able to f/u with PCP due to his car being broke down. He states he reported the sores to the nurse at nursing home before he was d/c and he stated they said he had to be d/c. Contacted jackie about no home health yet and she will look into that as well.  He wants to find him a PCP here in town to make it easier for him to see-he repots it takes too long to get in with [place].  Pt not weighing daily. He last weighed the other day and it was 145.  Last visit he did not weigh either. Reinforced the importance of weighing daily and he will try to weigh daily and he does have a calendar and he states he will write it down.  Daphne advised he missed his last clinic appointment so she is able to arrange a cab to get him at 130 for his 2pm appointment.  He states he is taking meds daily but sometimes he misses a couple doses a week. Pt denies any dizziness, no sob, no h/a. He does have anxiety and depression. He has been taking 2 of his potassium pills daily as they are tabs. He was used to the tabs.  poor appetite.  He states he uses the lantus as needed--advised  him it is prescribed for daily. He states his CBGs have been running in 110-120s. He still does not have asa, trazadone, xanax.   ACTION: Home visit completed Next visit planned for meet at clinic appointment tomor   Kerry Hough, EMT-Paramedic 09/06/16          This encounter is not signed. The conversation may still be ongoing.  Additional Documentation   Vitals:   BP 124/72   Pulse 90   Resp 16   Wt 147 lb (66.7 kg)   SpO2 98%   BMI 18.37 kg/m   BSA 1.88 m      More Vitals   Flowsheets:   Custom Formula Data,   MEWS Score,   Anthropometrics,   Vital Signs     Encounter Info:   Billing Info,   History,   Allergies,   Detailed Report     Orders Placed    None  Medication Changes     None    Medication List  Visit Diagnoses     None    Problem List

## 2016-09-07 NOTE — Patient Instructions (Signed)
You have been referred to [facility]. They will contact you to set up appointment.   Follow up in  2 weeks.

## 2016-09-07 NOTE — ED Triage Notes (Signed)
Pt reports recent hospitalization and stay at [facility]. Pt recently dc home and living with family. Reports having a wound on his buttock that is causing him pain. Denies fever.

## 2016-09-07 NOTE — Progress Notes (Signed)
I went to see [name] in the HF Clinic during his appointment this afternoon.  He tells me that he has not had much of an appetite.  I did brielfy review a low sodium diet and high sodium foods to avoid.  He has a sore on his back side which he tells [place] has been there since he was in [facility] that is very painful and swollen.  He is going to the ED at this time to have that evaluated.  [name] did send a referral for [name] for ongoing disease management and HF education/ symptom recognition.  He is being followed by the HF [employer].

## 2016-09-07 NOTE — ED Provider Notes (Signed)
[facility] Provider Note   CSN: [id number] Arrival date & time: 09/07/16  1546     History   Chief Complaint Chief Complaint  Patient presents with  . Wound Check    HPI [name] is a 61 y.o. male.  Patient states he was discharged from a nursing home 3 weeks ago where he was for several weeks for rehabilitation after hospitalization at that time.  He he reported that he had a sore on his buttock.  They told him that he needs to follow-up with his primary care doctor, which he has not done now presents with sore on his  buttock that's painful.      Past Medical History:  Diagnosis Date  . CHF (congestive heart failure) (HCC)   . Coronary artery disease   . Depression   . Diabetes mellitus   . Hypertension   . Mental disorder   . MI (myocardial infarction) [facility]) 23   age 2 per patient    Patient Active Problem List   Diagnosis Date Noted  . Abscess 09/07/2016  . Palliative care by specialist   . Goals of care, counseling/discussion   . Advance care planning   . Anxiety state   . Elevated LFTs   . ARF (acute renal failure) (HCC)   . Depression with anxiety 08/05/2016  . Malnutrition of moderate degree 04/12/2016  . Type 2 diabetes mellitus with hyperglycemia, with long-term current use of insulin (HCC) 04/12/2016  . Cough   . DNR (do not resuscitate) discussion   . Palliative care encounter   . Polysubstance abuse   . Pleural effusion   . Insulin dependent diabetes mellitus (HCC)   . Hepatitis, viral   . Pleural effusion on right   . CHF (congestive heart failure) (HCC) 09/07/2015  . Elevated troponin I level 03/10/2015  . Alcohol withdrawal (HCC)   . Type 2 diabetes mellitus with complication (HCC)   . 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 Alcohol abuse with intoxication (HCC)   . Hypertensive heart disease with chronic systolic congestive heart failure (HCC)   . 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 Alcohol dependence  with withdrawal delirium (HCC) 02/23/2014  . Hyponatremia 02/23/2014  . Transaminitis 02/22/2014  . Protein-calorie malnutrition (HCC) 02/22/2014  . Other pancytopenia (HCC) 02/22/2014  . Hyperkalemia 02/22/2014  . Hypomagnesemia 02/22/2014  . Alcoholic ketoacidosis 02/20/2014  . ETOH abuse 12/21/2012  . Cocaine dependence (HCC) 12/21/2012  . Substance induced mood disorder (HCC) 12/21/2012  . Non Obstructive CAD (coronary artery disease) 08/14/2012  . Cardiomyopathy, nonischemic (HCC) 08/14/2012  . Shortness of breath 08/11/2012  . Diabetes mellitus, type II (HCC) 08/11/2012  . History of substance abuse 08/11/2012  . Orthopnea 08/11/2012    Past Surgical History:  Procedure Laterality Date  . ANGIOPLASTY  1984   at age 5  . LEFT AND RIGHT HEART CATHETERIZATION WITH CORONARY/GRAFT ANGIOGRAM  08/13/2012   Procedure: LEFT AND RIGHT HEART CATHETERIZATION WITH [name];  Surgeon: Kathleene Hazel, MD;  Location: [facility] CATH LAB;  Service: Cardiovascular;;       Home Medications    Prior to Admission medications   Medication Sig Start Date End Date Taking? Authorizing Provider  acetaminophen (TYLENOL) 325 MG tablet Take 650 mg by mouth every 6 (six) hours as needed for mild pain.    [provider]  ALPRAZolam Prudy Feeler) 1 MG tablet Take 1 mg by mouth every 8 (eight) hours as needed for anxiety.    [provider]  ARIPiprazole (ABILIFY) 10 MG tablet Take 5 mg by mouth daily.    [provider]  aspirin 81 MG chewable tablet Chew 81 mg by mouth daily.    [provider]  atorvastatin (LIPITOR) 20 MG tablet Take 20 mg by mouth daily at 6 PM.    [provider]  buPROPion (WELLBUTRIN) 100 MG tablet Take 100 mg by mouth daily.     [provider]  digoxin (LANOXIN) 0.125 MG tablet Take 1 tablet (0.125 mg total) by mouth daily. 08/14/16   Maxie Barb, MD  furosemide (LASIX) 40 MG tablet Take 2 tablets (80  mg total) by mouth daily. 08/13/16   Maxie Barb, MD  hydrALAZINE (APRESOLINE) 25 MG tablet Take 1 tablet (25 mg total) by mouth every 8 (eight) hours. 08/13/16   Maxie Barb, MD  ibuprofen (ADVIL,MOTRIN) 600 MG tablet Take 1 tablet (600 mg total) by mouth every 6 (six) hours as needed. 09/07/16   Earley Favor, NP  insulin glargine (LANTUS) 100 UNIT/ML injection Inject 8 Units into the skin every morning.    [provider]  isosorbide mononitrate (IMDUR) 30 MG 24 hr tablet Take 1 tablet (30 mg total) by mouth daily. 08/14/16   Maxie Barb, MD  losartan (COZAAR) 50 MG tablet Take 1 tablet (50 mg total) by mouth daily. 08/14/16   Maxie Barb, MD  metFORMIN (GLUCOPHAGE) 1000 MG tablet Take 1 tablet (1,000 mg total) by mouth 2 (two) times daily with a meal. For high blood sugar control 08/13/16   Maxie Barb, MD  potassium chloride (K-DUR,KLOR-CON) 10 MEQ tablet Take 2 tablets (20 mEq total) by mouth daily. 08/13/16   Maxie Barb, MD  sertraline (ZOLOFT) 100 MG tablet Take 100 mg by mouth at bedtime.     [provider]  sulfamethoxazole-trimethoprim (BACTRIM DS,SEPTRA DS) 800-160 MG tablet Take 1 tablet by mouth 2 (two) times daily. 09/07/16 09/14/16  Earley Favor, NP  traZODone (DESYREL) 100 MG tablet Take 1 tablet (100 mg total) by mouth at bedtime. For sleep Patient not taking: Reported on 09/06/2016 12/27/12   Sanjuana Kava, NP    Family History Family History  Problem Relation Age of Onset  . Heart attack Father        >60s  . Hypertension Father   . Diabetes Father   . Heart attack Sister   . Hypertension Mother     Social History Social History  Substance Use Topics  . Smoking status: Current Every Day Smoker    Packs/day: 0.50    Types: Cigarettes  . Smokeless tobacco: Never Used  . Alcohol use No     Comment: sts he hasn't drunk in 6 months      Allergies   Patient has no known allergies.   Review of  Systems Review of Systems  Constitutional: Negative for fever.  Respiratory: Negative for shortness of breath.   Cardiovascular: Negative for chest pain.  Skin: Positive for wound.  All other systems reviewed and are negative.    Physical Exam Updated Vital Signs BP 115/83 (BP Location: Right Arm)   Pulse 85   Temp 97.6 F (36.4 C) (Oral)   Resp 19   SpO2 100%   Physical Exam  Constitutional: He appears well-developed and well-nourished.  HENT:  Head: Normocephalic.  Eyes: Pupils are equal, round, and reactive to light.  Neck: Normal range of motion.  Cardiovascular: Normal rate.   Pulmonary/Chest: Effort normal.  Abdominal: Soft.  Musculoskeletal: Normal range of motion.  Neurological: He is alert.  Skin: Skin is warm. There is erythema.        ED Treatments / Results  Labs (all labs ordered are listed, but only abnormal results are displayed) Labs Reviewed - No data to display  EKG  EKG Interpretation None       Radiology No results found.  Procedures .Marland KitchenIncision and Drainage Date/Time: 09/07/2016 9:43 PM Performed by: Earley Favor Authorized by: Earley Favor   Consent:    Consent obtained:  Verbal   Consent given by:  Patient   Risks discussed:  Bleeding, incomplete drainage and pain   Alternatives discussed:  No treatment Location:    Type:  Abscess   Location:  Anogenital   Anogenital location:  Gluteal cleft Pre-procedure details:    Skin preparation:  Betadine Anesthesia (see MAR for exact dosages):    Anesthesia method:  Local infiltration   Local anesthetic:  Lidocaine 1% w/o epi Procedure type:    Complexity:  Simple Procedure details:    Needle aspiration: no     Incision types:  Stab incision   Incision depth:  Dermal   Scalpel blade:  11   Wound management:  Probed and deloculated   Drainage:  Purulent   Drainage amount:  Scant   Packing materials:  None Post-procedure details:    Patient tolerance of procedure:  Tolerated  well, no immediate complications   (including critical care time)  Medications Ordered in ED Medications  sulfamethoxazole-trimethoprim (BACTRIM DS,SEPTRA DS) 800-160 MG per tablet 1 tablet (not administered)  ibuprofen (ADVIL,MOTRIN) tablet 600 mg (not administered)  lidocaine (PF) (XYLOCAINE) 1 % injection 2 mL (2 mLs Infiltration Given 09/07/16 2115)     Initial Impression / Assessment and Plan / ED Course  I have reviewed the triage vital signs and the nursing notes.  Pertinent labs & imaging results that were available during my care of the patient were reviewed by me and considered in my medical decision making (see chart for details).   .  ID was performed with scant purulent drainage.  Patient has been placed on antibiotic and warm compresses. Has been given referral to community wellness to establish local  primary medical care   Final Clinical Impressions(s) / ED Diagnoses   Final diagnoses:  Abscess    New Prescriptions New Prescriptions   IBUPROFEN (ADVIL,MOTRIN) 600 MG TABLET    Take 1 tablet (600 mg total) by mouth every 6 (six) hours as needed.   SULFAMETHOXAZOLE-TRIMETHOPRIM (BACTRIM DS,SEPTRA DS) 800-160 MG TABLET    Take 1 tablet by mouth 2 (two) times daily.     Earley Favor, NP 09/07/16 2146    Earley Favor, NP 09/07/16 2147    Marily Memos, MD 09/09/16 1146

## 2016-09-09 DIAGNOSIS — E1165 Type 2 diabetes mellitus with hyperglycemia: Secondary | ICD-10-CM | POA: Diagnosis not present

## 2016-09-09 DIAGNOSIS — R2681 Unsteadiness on feet: Secondary | ICD-10-CM | POA: Diagnosis not present

## 2016-09-09 DIAGNOSIS — Z794 Long term (current) use of insulin: Secondary | ICD-10-CM | POA: Diagnosis not present

## 2016-09-09 DIAGNOSIS — I2510 Atherosclerotic heart disease of native coronary artery without angina pectoris: Secondary | ICD-10-CM | POA: Diagnosis not present

## 2016-09-09 DIAGNOSIS — I5043 Acute on chronic combined systolic (congestive) and diastolic (congestive) heart failure: Secondary | ICD-10-CM | POA: Diagnosis not present

## 2016-09-09 DIAGNOSIS — I110 Hypertensive heart disease with heart failure: Secondary | ICD-10-CM | POA: Diagnosis not present

## 2016-09-12 ENCOUNTER — Telehealth: Payer: Self-pay | Admitting: Licensed Clinical Social Worker

## 2016-09-12 NOTE — Telephone Encounter (Signed)
CSW received referral to assist patient with obtaining a PCP. CSW contacted patient with no answer. Message left for return call. Lasandra Beech, LCSW, CCSW-MCS [phone]

## 2016-09-13 DIAGNOSIS — I5043 Acute on chronic combined systolic (congestive) and diastolic (congestive) heart failure: Secondary | ICD-10-CM | POA: Diagnosis not present

## 2016-09-13 DIAGNOSIS — Z794 Long term (current) use of insulin: Secondary | ICD-10-CM | POA: Diagnosis not present

## 2016-09-13 DIAGNOSIS — I2510 Atherosclerotic heart disease of native coronary artery without angina pectoris: Secondary | ICD-10-CM | POA: Diagnosis not present

## 2016-09-13 DIAGNOSIS — E1165 Type 2 diabetes mellitus with hyperglycemia: Secondary | ICD-10-CM | POA: Diagnosis not present

## 2016-09-13 DIAGNOSIS — R2681 Unsteadiness on feet: Secondary | ICD-10-CM | POA: Diagnosis not present

## 2016-09-13 DIAGNOSIS — I110 Hypertensive heart disease with heart failure: Secondary | ICD-10-CM | POA: Diagnosis not present

## 2016-09-14 ENCOUNTER — Telehealth (HOSPITAL_COMMUNITY): Payer: Self-pay

## 2016-09-14 ENCOUNTER — Other Ambulatory Visit (HOSPITAL_COMMUNITY): Payer: Self-pay

## 2016-09-14 NOTE — Progress Notes (Signed)
Paramedicine Encounter    Patient ID: [name], male    DOB: [birth year], 61 y.o.   MRN: [id number]   [facility] Care Team: [facility] - General (General Practice)  Patient Active Problem List   Diagnosis Date Noted  . Abscess 09/07/2016  . Palliative care by specialist   . Goals of care, counseling/discussion   . Advance care planning   . Anxiety state   . Elevated LFTs   . ARF (acute renal failure) (HCC)   . Depression with anxiety 08/05/2016  . Malnutrition of moderate degree 04/12/2016  . Type 2 diabetes mellitus with hyperglycemia, with long-term current use of insulin (HCC) 04/12/2016  . Cough   . DNR (do not resuscitate) discussion   . Palliative care encounter   . Polysubstance abuse   . Pleural effusion   . Insulin dependent diabetes mellitus (HCC)   . Hepatitis, viral   . Pleural effusion on right   . CHF (congestive heart failure) (HCC) 09/07/2015  . Elevated troponin I level 03/10/2015  . Alcohol withdrawal (HCC)   . Type 2 diabetes mellitus with complication (HCC)   . 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 Alcohol abuse with intoxication (HCC)   . Hypertensive heart disease with chronic systolic congestive heart failure (HCC)   . 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 Alcohol dependence with withdrawal delirium (HCC) 02/23/2014  . Hyponatremia 02/23/2014  . Transaminitis 02/22/2014  . Protein-calorie malnutrition (HCC) 02/22/2014  . Other pancytopenia (HCC) 02/22/2014  . Hyperkalemia 02/22/2014  . Hypomagnesemia 02/22/2014  . Alcoholic ketoacidosis 02/20/2014  . ETOH abuse 12/21/2012  . Cocaine dependence (HCC) 12/21/2012  . Substance induced mood disorder (HCC) 12/21/2012  . Non Obstructive CAD (coronary artery disease) 08/14/2012  . Cardiomyopathy, nonischemic (HCC) 08/14/2012  . Shortness of breath 08/11/2012  . Diabetes mellitus, type II (HCC) 08/11/2012  . History of substance abuse 08/11/2012  . Orthopnea 08/11/2012     Current Outpatient Prescriptions:  .  acetaminophen (TYLENOL) 325 MG tablet, Take 650 mg by mouth every 6 (six) hours as needed for mild pain., Disp: , Rfl:  .  ALPRAZolam (XANAX) 1 MG tablet, Take 1 mg by mouth every 8 (eight) hours as needed for anxiety., Disp: , Rfl:  .  ARIPiprazole (ABILIFY) 10 MG tablet, Take 5 mg by mouth daily., Disp: , Rfl:  .  atorvastatin (LIPITOR) 20 MG tablet, Take 20 mg by mouth daily at 6 PM., Disp: , Rfl:  .  buPROPion (WELLBUTRIN) 100 MG tablet, Take 100 mg by mouth daily. , Disp: , Rfl:  .  digoxin (LANOXIN) 0.125 MG tablet, Take 1 tablet (0.125 mg total) by mouth daily., Disp: 30 tablet, Rfl: 0 .  furosemide (LASIX) 40 MG tablet, Take 2 tablets (80 mg total) by mouth daily., Disp: 30 tablet, Rfl: 0 .  hydrALAZINE (APRESOLINE) 25 MG tablet, Take 1 tablet (25 mg total) by mouth every 8 (eight) hours., Disp: 60 tablet, Rfl: 0 .  insulin glargine (LANTUS) 100 UNIT/ML injection, Inject 8 Units into the skin every morning., Disp: , Rfl:  .  isosorbide mononitrate (IMDUR) 30 MG 24 hr tablet, Take 1 tablet (30 mg total) by mouth daily., Disp: 30 tablet, Rfl: 0 .  losartan (COZAAR) 50 MG tablet, Take 1 tablet (50 mg total) by mouth daily., Disp: 30 tablet, Rfl: 0 .  metFORMIN (GLUCOPHAGE) 1000 MG tablet, Take 1 tablet (1,000 mg total) by mouth 2 (two) times daily with a meal. For high blood sugar  control, Disp: , Rfl:  .  potassium chloride (K-DUR,KLOR-CON) 10 MEQ tablet, Take 2 tablets (20 mEq total) by mouth daily., Disp: 30 tablet, Rfl: 0 .  sertraline (ZOLOFT) 100 MG tablet, Take 100 mg by mouth at bedtime. , Disp: , Rfl:  .  sulfamethoxazole-trimethoprim (BACTRIM DS,SEPTRA DS) 800-160 MG tablet, Take 1 tablet by mouth 2 (two) times daily., Disp: 14 tablet, Rfl: 0 .  aspirin 81 MG chewable tablet, Chew 81 mg by mouth daily., Disp: , Rfl:  .  ibuprofen (ADVIL,MOTRIN) 600 MG tablet, Take 1 tablet (600 mg total) by mouth every 6 (six) hours as needed. (Patient not  taking: Reported on 09/14/2016), Disp: 30 tablet, Rfl: 0 .  traZODone (DESYREL) 100 MG tablet, Take 1 tablet (100 mg total) by mouth at bedtime. For sleep (Patient not taking: Reported on 09/06/2016), Disp: 30 tablet, Rfl: 0 No Known Allergies   Social History   Social History  . Marital status: Widowed    Spouse name: N/A  . Number of children: N/A  . Years of education: N/A   Occupational History  . Not on file.   Social History Main Topics  . Smoking status: Current Every Day Smoker    Packs/day: 0.50    Types: Cigarettes  . Smokeless tobacco: Never Used  . Alcohol use No     Comment: sts he hasn't drunk in 6 months   . Drug use: Yes    Frequency: 7.0 times per week    Types: Cocaine, Marijuana     Comment: last use about 6 months ago  . Sexual activity: Yes   Other Topics Concern  . Not on file   Social History Narrative  . No narrative on file    Physical Exam      SAFE - 08/24/16 1600      Situation   Admitting diagnosis chf   Heart failure history Exisiting   Comorbidities DM;HTN;Hx MI/CAD   Readmitted within 30 days Yes     Assessment   Lives alone No  lives with mom    Primary support person mom   Mode of transportation needs assistance   Assistance needed --  transportation   Other services involved Home Health;[facility]   Home equipement Cane     Weight   Weighs self daily No   Scale provided Yes   Records on weight chart No     Resources   Has "Living better w/heart failure" book No   Has HF Zone tool No   Able to identify yellow zone signs/when to call MD No   Records zone daily No     Medications   Uses a pill box No   Difficulty obtaining medications No   Mail order medications Yes   New medications at home today No   Missed one or more doses of medications per week No     Nutrition   Patient receives meals on wheels No   Patient follows low sodium diet Yes   Has foods at home that meet the current recommended diet No   Patient  follows low sugar/card diet No   Nutritional concerns/issues no     Activity Level   ADL's/Mobility Independent     Urine   Difficulty urinating No   Changes in urine None        Future Appointments Date Time Provider Department Center  09/21/2016 3:00 PM [facility] PA/NP [facility] None   Pulse 90   Resp 16   Wt 143  lb (64.9 kg)   SpO2 98%   BMI 17.87 kg/m  Weight yesterday-142 Last visit weight-147 [facility]-240  Pt was walking very well when I arrived, he went to ER for wounds on his rear, he looks much better than last week. Pt reports the a/c went out last night, they have called the company and they are on scheduled to come out to be fixed. They do have fans in the home to make it tolerable, it doesn't feel that hot inside.  The PT nurse came out yesterday, he thinks someone else with [facility] is coming out today.  Not sleeping ok during the night. He has to sleep on 2 pillows. Cant tolerate laying flat. He states he wakes up at 330am every day. He has not taken his meds correctly, he has been taking them straight across the top row once a day rather than 4x a day meds. He has been in hosp a few days for the sores. I explained to him again how to use the pill box and he seemed to understand it again.  He has wellbutrin in for 3 more days.he states he has about 4 days left of his antibiotic. He gets his meds through [facility], called in to order all his med refills. Pharmacy expects delivery to be made today. So as long as he gets his meds by tomor I can come out and finish his pill box tomor.  He states he feels tired, his b/p 78/systolic--no dizziness, no sob, states he is drinking ok, he states his appetite today was good.  +orthostatic   B/p dropped to 64/systolic while standing   Reports some dizziness.  Per dr Jesusita Oka via Aundra Millet at clinic to hold his torsemide for 2 days and I will get dee or zack to come by Friday to reassess. I will come out tomor if he gets his meds in.   Advised him if he felt worse than to call in to myself or clinic or 911 if he needed it.  ACTION: Home visit completed  Kerry Hough, EMT-Paramedic 09/14/16

## 2016-09-14 NOTE — Telephone Encounter (Signed)
[name] with CHF Paramedicine Program called CHF clinic triage to report patient orthostatic during visit today. SBP 78 sittig, 64 standing. Advised per Dr. Gala Romney to hold diuretics through Friday. Advised to recheck on patient Friday and will determine if able to resume diuretics and at what dose at that time.  Ave Filter, RN

## 2016-09-15 DIAGNOSIS — I2510 Atherosclerotic heart disease of native coronary artery without angina pectoris: Secondary | ICD-10-CM | POA: Diagnosis not present

## 2016-09-15 DIAGNOSIS — Z794 Long term (current) use of insulin: Secondary | ICD-10-CM | POA: Diagnosis not present

## 2016-09-15 DIAGNOSIS — I5043 Acute on chronic combined systolic (congestive) and diastolic (congestive) heart failure: Secondary | ICD-10-CM | POA: Diagnosis not present

## 2016-09-15 DIAGNOSIS — E1165 Type 2 diabetes mellitus with hyperglycemia: Secondary | ICD-10-CM | POA: Diagnosis not present

## 2016-09-15 DIAGNOSIS — R2681 Unsteadiness on feet: Secondary | ICD-10-CM | POA: Diagnosis not present

## 2016-09-15 DIAGNOSIS — I110 Hypertensive heart disease with heart failure: Secondary | ICD-10-CM | POA: Diagnosis not present

## 2016-09-16 DIAGNOSIS — I2510 Atherosclerotic heart disease of native coronary artery without angina pectoris: Secondary | ICD-10-CM | POA: Diagnosis not present

## 2016-09-16 DIAGNOSIS — Z794 Long term (current) use of insulin: Secondary | ICD-10-CM | POA: Diagnosis not present

## 2016-09-16 DIAGNOSIS — I5043 Acute on chronic combined systolic (congestive) and diastolic (congestive) heart failure: Secondary | ICD-10-CM | POA: Diagnosis not present

## 2016-09-16 DIAGNOSIS — R2681 Unsteadiness on feet: Secondary | ICD-10-CM | POA: Diagnosis not present

## 2016-09-16 DIAGNOSIS — I110 Hypertensive heart disease with heart failure: Secondary | ICD-10-CM | POA: Diagnosis not present

## 2016-09-16 DIAGNOSIS — E1165 Type 2 diabetes mellitus with hyperglycemia: Secondary | ICD-10-CM | POA: Diagnosis not present

## 2016-09-20 DIAGNOSIS — R2681 Unsteadiness on feet: Secondary | ICD-10-CM | POA: Diagnosis not present

## 2016-09-20 DIAGNOSIS — Z794 Long term (current) use of insulin: Secondary | ICD-10-CM | POA: Diagnosis not present

## 2016-09-20 DIAGNOSIS — I5043 Acute on chronic combined systolic (congestive) and diastolic (congestive) heart failure: Secondary | ICD-10-CM | POA: Diagnosis not present

## 2016-09-20 DIAGNOSIS — I110 Hypertensive heart disease with heart failure: Secondary | ICD-10-CM | POA: Diagnosis not present

## 2016-09-20 DIAGNOSIS — I2510 Atherosclerotic heart disease of native coronary artery without angina pectoris: Secondary | ICD-10-CM | POA: Diagnosis not present

## 2016-09-20 DIAGNOSIS — E1165 Type 2 diabetes mellitus with hyperglycemia: Secondary | ICD-10-CM | POA: Diagnosis not present

## 2016-09-21 ENCOUNTER — Encounter (HOSPITAL_COMMUNITY): Payer: Self-pay

## 2016-09-21 ENCOUNTER — Other Ambulatory Visit (HOSPITAL_COMMUNITY): Payer: Self-pay

## 2016-09-21 ENCOUNTER — Other Ambulatory Visit (HOSPITAL_COMMUNITY): Payer: Self-pay | Admitting: Cardiology

## 2016-09-21 ENCOUNTER — Ambulatory Visit (HOSPITAL_COMMUNITY)
Admission: RE | Admit: 2016-09-21 | Discharge: 2016-09-21 | Disposition: A | Discharge status: Home / Self Care | Payer: Medicare Other | Source: Ambulatory Visit | Attending: Internal Medicine | Admitting: Internal Medicine

## 2016-09-21 VITALS — BP 108/52 | HR 108 | Wt 152.0 lb

## 2016-09-21 DIAGNOSIS — I428 Other cardiomyopathies: Secondary | ICD-10-CM | POA: Diagnosis not present

## 2016-09-21 DIAGNOSIS — Z8249 Family history of ischemic heart disease and other diseases of the circulatory system: Secondary | ICD-10-CM | POA: Diagnosis not present

## 2016-09-21 DIAGNOSIS — I5022 Chronic systolic (congestive) heart failure: Secondary | ICD-10-CM | POA: Insufficient documentation

## 2016-09-21 DIAGNOSIS — I2510 Atherosclerotic heart disease of native coronary artery without angina pectoris: Secondary | ICD-10-CM

## 2016-09-21 DIAGNOSIS — Z79899 Other long term (current) drug therapy: Secondary | ICD-10-CM | POA: Diagnosis not present

## 2016-09-21 DIAGNOSIS — E119 Type 2 diabetes mellitus without complications: Secondary | ICD-10-CM | POA: Diagnosis not present

## 2016-09-21 DIAGNOSIS — I110 Hypertensive heart disease with heart failure: Secondary | ICD-10-CM | POA: Insufficient documentation

## 2016-09-21 DIAGNOSIS — I252 Old myocardial infarction: Secondary | ICD-10-CM | POA: Diagnosis not present

## 2016-09-21 DIAGNOSIS — F1410 Cocaine abuse, uncomplicated: Secondary | ICD-10-CM | POA: Diagnosis not present

## 2016-09-21 DIAGNOSIS — F329 Major depressive disorder, single episode, unspecified: Secondary | ICD-10-CM | POA: Diagnosis not present

## 2016-09-21 DIAGNOSIS — L0231 Cutaneous abscess of buttock: Secondary | ICD-10-CM | POA: Insufficient documentation

## 2016-09-21 DIAGNOSIS — R768 Other specified abnormal immunological findings in serum: Secondary | ICD-10-CM | POA: Diagnosis not present

## 2016-09-21 DIAGNOSIS — F1010 Alcohol abuse, uncomplicated: Secondary | ICD-10-CM | POA: Diagnosis not present

## 2016-09-21 DIAGNOSIS — B1920 Unspecified viral hepatitis C without hepatic coma: Secondary | ICD-10-CM | POA: Insufficient documentation

## 2016-09-21 DIAGNOSIS — F1910 Other psychoactive substance abuse, uncomplicated: Secondary | ICD-10-CM

## 2016-09-21 DIAGNOSIS — Z7982 Long term (current) use of aspirin: Secondary | ICD-10-CM | POA: Insufficient documentation

## 2016-09-21 DIAGNOSIS — E1100 Type 2 diabetes mellitus with hyperosmolarity without nonketotic hyperglycemic-hyperosmolar coma (NKHHC): Secondary | ICD-10-CM

## 2016-09-21 DIAGNOSIS — F17210 Nicotine dependence, cigarettes, uncomplicated: Secondary | ICD-10-CM | POA: Diagnosis not present

## 2016-09-21 DIAGNOSIS — Z9114 Patient's other noncompliance with medication regimen: Secondary | ICD-10-CM | POA: Insufficient documentation

## 2016-09-21 DIAGNOSIS — Z833 Family history of diabetes mellitus: Secondary | ICD-10-CM | POA: Insufficient documentation

## 2016-09-21 DIAGNOSIS — Z794 Long term (current) use of insulin: Secondary | ICD-10-CM | POA: Diagnosis not present

## 2016-09-21 LAB — BRAIN NATRIURETIC PEPTIDE: B NATRIURETIC PEPTIDE 5: 1292.9 pg/mL — AB (ref 0.0–100.0)

## 2016-09-21 LAB — BASIC METABOLIC PANEL
Anion gap: 10 (ref 5–15)
BUN: 12 mg/dL (ref 6–20)
CO2: 17 mmol/L — ABNORMAL LOW (ref 22–32)
Calcium: 8.4 mg/dL — ABNORMAL LOW (ref 8.9–10.3)
Chloride: 110 mmol/L (ref 101–111)
Creatinine, Ser: 0.78 mg/dL (ref 0.61–1.24)
GFR calc Af Amer: >60 mL/min (ref >60)
Glucose, Bld: 191 mg/dL — ABNORMAL HIGH (ref 65–99)
Potassium: 3.9 mmol/L (ref 3.5–5.1)
Sodium: 137 mmol/L (ref 135–145)

## 2016-09-21 MED ORDER — ASPIRIN 81 MG PO CHEW: 81.0000 mg | CHEWABLE_TABLET | Freq: Every day | ORAL | 3 refills | Status: DC

## 2016-09-21 MED ORDER — FUROSEMIDE 40 MG PO TABS: 80.0000 mg | ORAL_TABLET | Freq: Every day | ORAL | 3 refills | Status: DC

## 2016-09-21 MED ORDER — LOSARTAN POTASSIUM 50 MG PO TABS: 50.0000 mg | ORAL_TABLET | Freq: Every day | ORAL | 0 refills | Status: DC

## 2016-09-21 MED ORDER — POTASSIUM CHLORIDE CRYS ER 10 MEQ PO TBCR: 20.0000 meq | EXTENDED_RELEASE_TABLET | Freq: Every day | ORAL | 0 refills | Status: DC

## 2016-09-21 MED ORDER — POTASSIUM CHLORIDE CRYS ER 10 MEQ PO TBCR: 20.0000 meq | EXTENDED_RELEASE_TABLET | Freq: Every day | ORAL | 3 refills | Status: DC

## 2016-09-21 MED ORDER — ISOSORBIDE MONONITRATE ER 30 MG PO TB24: 30.0000 mg | ORAL_TABLET | Freq: Every day | ORAL | 3 refills | Status: DC

## 2016-09-21 MED ORDER — HYDRALAZINE HCL 25 MG PO TABS: 25.0000 mg | ORAL_TABLET | Freq: Three times a day (TID) | ORAL | 2 refills | Status: DC

## 2016-09-21 MED ORDER — LOSARTAN POTASSIUM 50 MG PO TABS: 50.0000 mg | ORAL_TABLET | Freq: Every day | ORAL | 3 refills | Status: DC

## 2016-09-21 MED ORDER — ATORVASTATIN CALCIUM 20 MG PO TABS: 20.0000 mg | ORAL_TABLET | Freq: Every day | ORAL | 3 refills | Status: AC

## 2016-09-21 MED ORDER — DIGOXIN 125 MCG PO TABS: 0.1250 mg | ORAL_TABLET | Freq: Every day | ORAL | 3 refills | Status: DC

## 2016-09-21 MED ORDER — ISOSORBIDE MONONITRATE ER 30 MG PO TB24: 30.0000 mg | ORAL_TABLET | Freq: Every day | ORAL | 0 refills | Status: AC

## 2016-09-21 MED ORDER — HYDRALAZINE HCL 25 MG PO TABS: 25.0000 mg | ORAL_TABLET | Freq: Three times a day (TID) | ORAL | 2 refills | Status: AC

## 2016-09-21 MED ORDER — DIGOXIN 125 MCG PO TABS: 0.1250 mg | ORAL_TABLET | Freq: Every day | ORAL | 0 refills | Status: AC

## 2016-09-21 MED FILL — DIGOXIN 0.125 MG TABLET: 125 | 30 days supply | Qty: 30 | Fill #0

## 2016-09-21 MED FILL — POTASSIUM CL 10 MEQ TAB SA: 10 | 30 days supply | Qty: 60 | Fill #0

## 2016-09-21 MED FILL — LOSARTAN POTASSIUM 50 MG TA: 50 | 30 days supply | Qty: 30 | Fill #0

## 2016-09-21 MED FILL — ISOSORBIDE MN ER 30 MG TAB: 30 | 30 days supply | Qty: 30 | Fill #0

## 2016-09-21 NOTE — Addendum Note (Signed)
Encounter addended by: Theresia Bough, CMA on: 09/21/2016  4:02 PM<BR>    Actions taken: Pharmacy for encounter modified, Order list changed

## 2016-09-21 NOTE — Progress Notes (Signed)
Advanced Heart Failure Clinic Note   Primary Care: None Primary Cardiologist: Dr. Shirlee Latch   HPI:  [name] is a 61 y.o. male with h/o of NICM, Combined CHF, HTN, DM, CAD, ETOH abuse, and cocaine abuse.   Admitted 4/13 -> 08/13/16 with A/C systolic CHF. PTA had been out of HF meds for at least 3 months and was drinking at least three 40 oz bees per day along with frequent cocaine use. UDS + for cocaine. PICC placed and initial mixed venous sat 40%. Stared on milrinone and diuresed. Weaned off inotropes as not candidate for home with ongoing substance abuse. Discharged to SNF for rehab.   He presents today for regular follow up. Breathing is ok.  Occasional lightheadedness with rapid standing but not marked or limiting. Weight up 6 lbs since last visit, but diuretics held last week with orthostasis and appetite much improved. No longer missing his medications. Sore on his buttock has improved. Was treated with I&D and ABX. Denies N/V, fevers, or chills. Denies near syncope. Sleeps on a folded comforter + 1 pillow for orthopnea. Occasional bendopnea. Denies peripheral edema or abdominal distention.   Echo 4/15/208 LVEF 10-15%, PA peak pressure 69 mm hg, RV mildly dilated. Trivial pericardial effusion. L pleural effusion.  Review of systems complete and found to be negative unless listed in HPI.    Past Medical History 1. Chronic systolic CHF  - Echo 01/2014 LVEF 25% - Echo 4/15/208 LVEF 10-15%, PA peak pressure 69 mm hg, RV mildly dilated. Trivial pericardial effusion. L pleural effusion. - NICM. Likely ETOH related with extremely high intake (upwards of 120 oz a day at one point) 2. ETOH abuse - Excessive. Was at one point drinking upwards of three 40 oz beers a day.  3. Substance Abuse - Cocaine positive last admission 4. Tobacco abuse - Smoking 1/2 pack a day 5. CAD  - Non obstructive via cath 2014  Past Medical History:  Diagnosis Date  . CHF (congestive heart failure)  (HCC)   . Coronary artery disease   . Depression   . Diabetes mellitus   . Hypertension   . Mental disorder   . MI (myocardial infarction) (HCC) 53   age 30 per patient    Current Outpatient Prescriptions  Medication Sig Dispense Refill  . aspirin 81 MG chewable tablet Chew 81 mg by mouth daily.    Marland Kitchen atorvastatin (LIPITOR) 20 MG tablet Take 20 mg by mouth daily at 6 PM.    . digoxin (LANOXIN) 0.125 MG tablet Take 1 tablet (0.125 mg total) by mouth daily. 30 tablet 0  . furosemide (LASIX) 40 MG tablet Take 2 tablets (80 mg total) by mouth daily. 30 tablet 0  . hydrALAZINE (APRESOLINE) 25 MG tablet Take 25 mg by mouth 2 (two) times daily.    . insulin glargine (LANTUS) 100 UNIT/ML injection Inject 8 Units into the skin every morning.    . isosorbide mononitrate (IMDUR) 30 MG 24 hr tablet Take 1 tablet (30 mg total) by mouth daily. 30 tablet 0  . losartan (COZAAR) 50 MG tablet Take 1 tablet (50 mg total) by mouth daily. 30 tablet 0  . metFORMIN (GLUCOPHAGE) 1000 MG tablet Take 1 tablet (1,000 mg total) by mouth 2 (two) times daily with a meal. For high blood sugar control    . sertraline (ZOLOFT) 100 MG tablet Take 100 mg by mouth at bedtime.     . traZODone (DESYREL) 100 MG tablet Take 1 tablet (100 mg  total) by mouth at bedtime. For sleep 30 tablet 0  . acetaminophen (TYLENOL) 325 MG tablet Take 650 mg by mouth every 6 (six) hours as needed for mild pain.    Marland Kitchen ALPRAZolam (XANAX) 1 MG tablet Take 1 mg by mouth every 8 (eight) hours as needed for anxiety.    . ARIPiprazole (ABILIFY) 10 MG tablet Take 5 mg by mouth daily.    Marland Kitchen buPROPion (WELLBUTRIN) 100 MG tablet Take 100 mg by mouth daily.     Marland Kitchen ibuprofen (ADVIL,MOTRIN) 600 MG tablet Take 1 tablet (600 mg total) by mouth every 6 (six) hours as needed. (Patient not taking: Reported on 09/21/2016) 30 tablet 0  . potassium chloride (K-DUR,KLOR-CON) 10 MEQ tablet Take 2 tablets (20 mEq total) by mouth daily. (Patient not taking: Reported on  09/21/2016) 30 tablet 0   No current facility-administered medications for this encounter.     No Known Allergies    Social History   Social History  . Marital status: Widowed    Spouse name: N/A  . Number of children: N/A  . Years of education: N/A   Occupational History  . Not on file.   Social History Main Topics  . Smoking status: Current Every Day Smoker    Packs/day: 0.50    Types: Cigarettes  . Smokeless tobacco: Never Used  . Alcohol use No     Comment: sts he hasn't drunk in 6 months   . Drug use: Yes    Frequency: 7.0 times per week    Types: Cocaine, Marijuana     Comment: last use about 6 months ago  . Sexual activity: Yes   Other Topics Concern  . Not on file   Social History Narrative  . No narrative on file      Family History  Problem Relation Age of Onset  . Heart attack Father        >60s  . Hypertension Father   . Diabetes Father   . Heart attack Sister   . Hypertension Mother     Vitals:   09/21/16 1459  BP: (!) 108/52  Pulse: (!) 108  SpO2: 99%  Weight: 152 lb (68.9 kg)   Wt Readings from Last 3 Encounters:  09/21/16 152 lb (68.9 kg)  09/14/16 143 lb (64.9 kg)  09/07/16 146 lb (66.2 kg)     PHYSICAL EXAM: General:  Elderly and fatigued appearing. NAD.  HEENT: Normal Neck: supple. JVP 6-7 cm. Carotids 2+ bilat; no bruits. No lymphadenopathy or thyromegaly appreciated. Cor: PMI nondisplaced. Regular rate & rhythm. +S3 Lungs: Clear Abdomen: soft, nontender, nondistended. No hepatosplenomegaly. No bruits or masses. Good bowel sounds. Extremities: no cyanosis, clubbing, rash, edema Neuro: alert & oriented x 3, cranial nerves grossly intact. moves all 4 extremities w/o difficulty. Affect pleasant.  ASSESSMENT & PLAN:  1. Chronic systolic CHF with recent admission for cardiogenic shock  - Echo 4/15/208 LVEF 10-15%, PA peak pressure 69 mm hg, RV mildly dilated. Trivial pericardial effusion. L pleural effusion. - Volume status  looks stable on exam. NYHA Class III symptoms - Continue lasix 80 mg daily.  - Continue potassium 20 meq daily - Increase hydralazine 25 mg TID - Continue imdur 30 mg daily.  - Continue losartan 50 mg daily. - Continue digoxin 0.125 mg daily.  - Reinforced fluid restriction to < 2 L daily, sodium restriction to less than 2000 mg daily, and the importance of daily weights.   2. CAD  - Non obstructive on LHC  2014 - No ACS symptoms 3. ETOH abuse - States he has had 1 can of beer this week. Encouraged to stop completely, but congratulated on effort (was previously drinking > 100 oz daily)  4. Substance Abuse - States he has remained abstinent from cocaine and any other substance since discharge. Congratulated and encouraged to continue.  5. Tobacco Abuse - Has cut back to 1-2 cigarettes a day. Again encouraged to stop completely.  6. Non-compliance - Improved with paramedicine.  - Taking all meds, but taking hydralazine BID not TID. Marland Kitchen 7. Buttock abscess - Seen in ED. Completed ABX s/p I&D.  8. Hepatitis C - + antibody in 2015, and quantitative labs drawn in 2017.  Has had poor follow up.  - [facility]   Improved. Will have see Pharm-D in 4 weeks, and MD in 2 months. Meds as above. Labs today.   Graciella Freer, PA-C 09/21/16   Greater than 50% of the 25 minute visit was spent in counseling/coordination of care regarding disease state education, social work, Risk manager, and medical regimen.

## 2016-09-21 NOTE — Progress Notes (Signed)
CSW met with patient along with [facility] paramedic. Patient recently discharged from a SNF and reports need for follow up at [facility] in [place] to refill many of his medications. Patient reports his biggest challenge is transportation. CSW assisted patient with obtaining appointment at [facility] ( July 13 @ 1 pm)  and getting needed medications. CSW will follow up with [facility] regarding transport benefits as well as complete a SCAT application to assist with needs going forward. CSW will continue to coordinate needs with paramedicine. Raquel Sarna, [place], [place]

## 2016-09-21 NOTE — Addendum Note (Signed)
Encounter addended by: Marcy Siren, LCSW on: 09/21/2016  4:46 PM<BR>    Actions taken: Sign clinical note

## 2016-09-21 NOTE — Patient Instructions (Signed)
INCREASE Hydralazine to 25 mg, one tab three times per day  Labs today We will only contact you if something comes back abnormal or we need to make some changes. Otherwise no news is good news!   Your physician recommends that you schedule a follow-up appointment in: 1 month with Cicero Duck, PharmD  Your physician recommends that you schedule a follow-up appointment in: 2 months with Dr Shirlee Latch

## 2016-09-21 NOTE — Progress Notes (Signed)
Paramedicine Encounter   Patient ID: [name] , male,   DOB: 12/24/1955,61 y.o.,  MRN: [id number]   Met patient in clinic today with provider.  Time spent with patient   Pt reports he did not get his meds that we called in last week.  He filled up his pill box himself but didn't fill the morning row up today. He is taking the hydralazine BID. Pt denies sob, pt reports that his sores on his bottom is getting better, he has home health nurse come out twice weekly with PT. He overall seems to be improving.  Pt denies any dizziness. No h/a. His appetite has improved, no swelling. He had very little appetite when I first started seeing him.  There was some confusion about his meds last week--we called in the refills for him and they said the meds would be out that same day but nothing showed up and then when I called the pharmacy advised that it was for only patients in nursing home--so he normally goes to [place] but hasnt been there in a while-will have to coordinate with Joshua about his refills--jackie contacted the [place] and was able to get a fax number to send his med list over to them to get refills mailed out and he has appointment on July 13 at 1pm at the [facility] clinic, in the meantime it was worked out for him to get a months supply of meds to the [facility] until the mail order comes. I will go get his meds and take them to him today or tomor and fill his pill box back up with what is needed.   Weight today-152 Last visit-143  Marylouise Stacks, EMT-Paramedic 09/21/2016   ACTION: Home visit completed

## 2016-09-22 DIAGNOSIS — I2510 Atherosclerotic heart disease of native coronary artery without angina pectoris: Secondary | ICD-10-CM | POA: Diagnosis not present

## 2016-09-22 DIAGNOSIS — Z794 Long term (current) use of insulin: Secondary | ICD-10-CM | POA: Diagnosis not present

## 2016-09-22 DIAGNOSIS — R2681 Unsteadiness on feet: Secondary | ICD-10-CM | POA: Diagnosis not present

## 2016-09-22 DIAGNOSIS — E1165 Type 2 diabetes mellitus with hyperglycemia: Secondary | ICD-10-CM | POA: Diagnosis not present

## 2016-09-22 DIAGNOSIS — I110 Hypertensive heart disease with heart failure: Secondary | ICD-10-CM | POA: Diagnosis not present

## 2016-09-22 DIAGNOSIS — I5043 Acute on chronic combined systolic (congestive) and diastolic (congestive) heart failure: Secondary | ICD-10-CM | POA: Diagnosis not present

## 2016-09-23 ENCOUNTER — Other Ambulatory Visit (HOSPITAL_COMMUNITY): Payer: Self-pay

## 2016-09-23 DIAGNOSIS — I110 Hypertensive heart disease with heart failure: Secondary | ICD-10-CM | POA: Diagnosis not present

## 2016-09-23 DIAGNOSIS — R2681 Unsteadiness on feet: Secondary | ICD-10-CM | POA: Diagnosis not present

## 2016-09-23 DIAGNOSIS — E1165 Type 2 diabetes mellitus with hyperglycemia: Secondary | ICD-10-CM | POA: Diagnosis not present

## 2016-09-23 DIAGNOSIS — I5043 Acute on chronic combined systolic (congestive) and diastolic (congestive) heart failure: Secondary | ICD-10-CM | POA: Diagnosis not present

## 2016-09-23 DIAGNOSIS — I2510 Atherosclerotic heart disease of native coronary artery without angina pectoris: Secondary | ICD-10-CM | POA: Diagnosis not present

## 2016-09-23 DIAGNOSIS — Z794 Long term (current) use of insulin: Secondary | ICD-10-CM | POA: Diagnosis not present

## 2016-09-23 NOTE — Progress Notes (Signed)
Paramedicine Encounter    Patient ID: [name], male    DOB: 06/20/1955, 61 y.o.   MRN: [id number]    Patient Care Team: [facility] as PCP - General (General Practice)  Patient Active Problem List   Diagnosis Date Noted  . Abscess 09/07/2016  . Palliative care by specialist   . Goals of care, counseling/discussion   . Advance care planning   . Anxiety state   . Elevated LFTs   . ARF (acute renal failure) (HCC)   . Depression with anxiety 08/05/2016  . Malnutrition of moderate degree 04/12/2016  . Type 2 diabetes mellitus with hyperglycemia, with long-term current use of insulin (HCC) 04/12/2016  . Cough   . DNR (do not resuscitate) discussion   . Palliative care encounter   . Polysubstance abuse   . Pleural effusion   . Insulin dependent diabetes mellitus (HCC)   . Hepatitis, viral   . Pleural effusion on right   . CHF (congestive heart failure) (HCC) 09/07/2015  . Elevated troponin I level 03/10/2015  . Alcohol withdrawal (HCC)   . Type 2 diabetes mellitus with complication (HCC)   . 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 Hypertensive heart disease with chronic systolic congestive heart failure (HCC)   . 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 Alcohol dependence with withdrawal delirium (HCC) 02/23/2014  . Hyponatremia 02/23/2014  . Transaminitis 02/22/2014  . Protein-calorie malnutrition (HCC) 02/22/2014  . Other pancytopenia (HCC) 02/22/2014  . Hyperkalemia 02/22/2014  . Hypomagnesemia 02/22/2014  . Alcoholic ketoacidosis 02/20/2014  . ETOH abuse 12/21/2012  . Cocaine dependence (HCC) 12/21/2012  . Substance induced mood disorder (HCC) 12/21/2012  . Non Obstructive CAD (coronary artery disease) 08/14/2012  . Cardiomyopathy, nonischemic (HCC) 08/14/2012  . Shortness of breath 08/11/2012  . Diabetes mellitus, type II (HCC) 08/11/2012  . History of substance abuse 08/11/2012  . Orthopnea 08/11/2012    Current Outpatient Prescriptions:   .  losartan (COZAAR) 50 MG tablet, Take 1 tablet (50 mg total) by mouth daily., Disp: 30 tablet, Rfl: 0 .  potassium chloride (K-DUR,KLOR-CON) 10 MEQ tablet, Take 2 tablets (20 mEq total) by mouth daily., Disp: 60 tablet, Rfl: 0 .  acetaminophen (TYLENOL) 325 MG tablet, Take 650 mg by mouth every 6 (six) hours as needed for mild pain., Disp: , Rfl:  .  ALPRAZolam (XANAX) 1 MG tablet, Take 1 mg by mouth every 8 (eight) hours as needed for anxiety., Disp: , Rfl:  .  ARIPiprazole (ABILIFY) 10 MG tablet, Take 5 mg by mouth daily., Disp: , Rfl:  .  aspirin 81 MG chewable tablet, Chew 1 tablet (81 mg total) by mouth daily., Disp: 90 tablet, Rfl: 3 .  atorvastatin (LIPITOR) 20 MG tablet, Take 1 tablet (20 mg total) by mouth daily at 6 PM., Disp: 90 tablet, Rfl: 3 .  buPROPion (WELLBUTRIN) 100 MG tablet, Take 100 mg by mouth daily. , Disp: , Rfl:  .  digoxin (LANOXIN) 0.125 MG tablet, Take 1 tablet (0.125 mg total) by mouth daily., Disp: 30 tablet, Rfl: 0 .  furosemide (LASIX) 40 MG tablet, Take 2 tablets (80 mg total) by mouth daily., Disp: 180 tablet, Rfl: 3 .  hydrALAZINE (APRESOLINE) 25 MG tablet, Take 1 tablet (25 mg total) by mouth 3 (three) times daily., Disp: 270 tablet, Rfl: 2 .  ibuprofen (ADVIL,MOTRIN) 600 MG tablet, Take 1 tablet (600 mg total) by mouth every 6 (six) hours as needed. (Patient not taking: Reported on 09/21/2016), Disp: 30 tablet,  Rfl: 0 .  insulin glargine (LANTUS) 100 UNIT/ML injection, Inject 8 Units into the skin every morning., Disp: , Rfl:  .  isosorbide mononitrate (IMDUR) 30 MG 24 hr tablet, Take 1 tablet (30 mg total) by mouth daily., Disp: 30 tablet, Rfl: 0 .  metFORMIN (GLUCOPHAGE) 1000 MG tablet, Take 1 tablet (1,000 mg total) by mouth 2 (two) times daily with a meal. For high blood sugar control, Disp: , Rfl:  .  sertraline (ZOLOFT) 100 MG tablet, Take 100 mg by mouth at bedtime. , Disp: , Rfl:  .  traZODone (DESYREL) 100 MG tablet, Take 1 tablet (100 mg total) by  mouth at bedtime. For sleep, Disp: 30 tablet, Rfl: 0 No Known Allergies   Social History   Social History  . Marital status: Widowed    Spouse name: N/A  . Number of children: N/A  . Years of education: N/A   Occupational History  . Not on file.   Social History Main Topics  . Smoking status: Current Every Day Smoker    Packs/day: 0.50    Types: Cigarettes  . Smokeless tobacco: Never Used  . Alcohol use No     Comment: sts he hasn't drunk in 6 months   . Drug use: Yes    Frequency: 7.0 times per week    Types: Cocaine, Marijuana     Comment: last use about 6 months ago  . Sexual activity: Yes   Other Topics Concern  . Not on file   Social History Narrative  . No narrative on file    Physical Exam      SAFE - 08/24/16 1600      Situation   Admitting diagnosis chf   Heart failure history Exisiting   Comorbidities DM;HTN;Hx MI/CAD   Readmitted within 30 days Yes     Assessment   Lives alone No  lives with mom    Primary support person mom   Mode of transportation needs assistance   Assistance needed --  transportation   Other services involved Home Health;[facility]   Home equipement Cane     Weight   Weighs self daily No   Scale provided Yes   Records on weight chart No     Resources   Has "Living better w/heart failure" book No   Has HF Zone tool No   Able to identify yellow zone signs/when to call MD No   Records zone daily No     Medications   Uses a pill box No   Difficulty obtaining medications No   Mail order medications Yes   New medications at home today No   Missed one or more doses of medications per week No     Nutrition   Patient receives meals on wheels No   Patient follows low sodium diet Yes   Has foods at home that meet the current recommended diet No   Patient follows low sugar/card diet No   Nutritional concerns/issues no     Activity Level   ADL's/Mobility Independent     Urine   Difficulty urinating No   Changes in  urine None        Future Appointments Date Time Provider Department Center  10/19/2016 1:15 PM [facility] None  11/22/2016 12:00 PM Laurey Morale, MD [facility] None    ATF pt CAO x4 c/o pain on his right hip. Pt stated that he had the sore drained yesterday and it hurts when he sit down on  it.  He stated that he has tylenol for the pain.  Pt denies sob, dizziness, headache and chest pain.  [facility] and filled his pill box with the missing medications.  He was still out of a few medications which he stated he get from the [place].    Picked up: isosorbride Digoxin Losartan potassium  BP 108/64 (BP Location: Left Arm, Patient Position: Sitting, Cuff Size: Normal)   Pulse 97   Resp 16   Wt 145 lb (65.8 kg)   SpO2 100%   BMI 18.12 kg/m  cbg 120  Weight yesterday-144     [name], EMT Paramedic 09/23/2016    ACTION: Home visit completed

## 2016-09-29 DIAGNOSIS — E1165 Type 2 diabetes mellitus with hyperglycemia: Secondary | ICD-10-CM | POA: Diagnosis not present

## 2016-09-29 DIAGNOSIS — I110 Hypertensive heart disease with heart failure: Secondary | ICD-10-CM | POA: Diagnosis not present

## 2016-09-29 DIAGNOSIS — R2681 Unsteadiness on feet: Secondary | ICD-10-CM | POA: Diagnosis not present

## 2016-09-29 DIAGNOSIS — I2510 Atherosclerotic heart disease of native coronary artery without angina pectoris: Secondary | ICD-10-CM | POA: Diagnosis not present

## 2016-09-29 DIAGNOSIS — Z794 Long term (current) use of insulin: Secondary | ICD-10-CM | POA: Diagnosis not present

## 2016-09-29 DIAGNOSIS — I5043 Acute on chronic combined systolic (congestive) and diastolic (congestive) heart failure: Secondary | ICD-10-CM | POA: Diagnosis not present

## 2016-09-30 DIAGNOSIS — I5043 Acute on chronic combined systolic (congestive) and diastolic (congestive) heart failure: Secondary | ICD-10-CM | POA: Diagnosis not present

## 2016-09-30 DIAGNOSIS — I110 Hypertensive heart disease with heart failure: Secondary | ICD-10-CM | POA: Diagnosis not present

## 2016-09-30 DIAGNOSIS — R2681 Unsteadiness on feet: Secondary | ICD-10-CM | POA: Diagnosis not present

## 2016-09-30 DIAGNOSIS — I2510 Atherosclerotic heart disease of native coronary artery without angina pectoris: Secondary | ICD-10-CM | POA: Diagnosis not present

## 2016-09-30 DIAGNOSIS — Z794 Long term (current) use of insulin: Secondary | ICD-10-CM | POA: Diagnosis not present

## 2016-09-30 DIAGNOSIS — E1165 Type 2 diabetes mellitus with hyperglycemia: Secondary | ICD-10-CM | POA: Diagnosis not present

## 2016-10-04 DIAGNOSIS — Z794 Long term (current) use of insulin: Secondary | ICD-10-CM | POA: Diagnosis not present

## 2016-10-04 DIAGNOSIS — I110 Hypertensive heart disease with heart failure: Secondary | ICD-10-CM | POA: Diagnosis not present

## 2016-10-04 DIAGNOSIS — I2510 Atherosclerotic heart disease of native coronary artery without angina pectoris: Secondary | ICD-10-CM | POA: Diagnosis not present

## 2016-10-04 DIAGNOSIS — I5043 Acute on chronic combined systolic (congestive) and diastolic (congestive) heart failure: Secondary | ICD-10-CM | POA: Diagnosis not present

## 2016-10-04 DIAGNOSIS — R2681 Unsteadiness on feet: Secondary | ICD-10-CM | POA: Diagnosis not present

## 2016-10-04 DIAGNOSIS — E1165 Type 2 diabetes mellitus with hyperglycemia: Secondary | ICD-10-CM | POA: Diagnosis not present

## 2016-10-05 DIAGNOSIS — I2510 Atherosclerotic heart disease of native coronary artery without angina pectoris: Secondary | ICD-10-CM | POA: Diagnosis not present

## 2016-10-05 DIAGNOSIS — R2681 Unsteadiness on feet: Secondary | ICD-10-CM | POA: Diagnosis not present

## 2016-10-05 DIAGNOSIS — Z794 Long term (current) use of insulin: Secondary | ICD-10-CM | POA: Diagnosis not present

## 2016-10-05 DIAGNOSIS — I5043 Acute on chronic combined systolic (congestive) and diastolic (congestive) heart failure: Secondary | ICD-10-CM | POA: Diagnosis not present

## 2016-10-05 DIAGNOSIS — I110 Hypertensive heart disease with heart failure: Secondary | ICD-10-CM | POA: Diagnosis not present

## 2016-10-05 DIAGNOSIS — E1165 Type 2 diabetes mellitus with hyperglycemia: Secondary | ICD-10-CM | POA: Diagnosis not present

## 2016-10-06 ENCOUNTER — Other Ambulatory Visit (HOSPITAL_COMMUNITY): Payer: Self-pay

## 2016-10-06 DIAGNOSIS — I110 Hypertensive heart disease with heart failure: Secondary | ICD-10-CM | POA: Diagnosis not present

## 2016-10-06 DIAGNOSIS — E1165 Type 2 diabetes mellitus with hyperglycemia: Secondary | ICD-10-CM | POA: Diagnosis not present

## 2016-10-06 DIAGNOSIS — I2510 Atherosclerotic heart disease of native coronary artery without angina pectoris: Secondary | ICD-10-CM | POA: Diagnosis not present

## 2016-10-06 DIAGNOSIS — Z794 Long term (current) use of insulin: Secondary | ICD-10-CM | POA: Diagnosis not present

## 2016-10-06 DIAGNOSIS — I5043 Acute on chronic combined systolic (congestive) and diastolic (congestive) heart failure: Secondary | ICD-10-CM | POA: Diagnosis not present

## 2016-10-06 DIAGNOSIS — R2681 Unsteadiness on feet: Secondary | ICD-10-CM | POA: Diagnosis not present

## 2016-10-06 NOTE — Progress Notes (Signed)
Paramedicine Encounter    Patient ID: [name], male    DOB: 1955/04/26, 61 y.o.   MRN: [id number]   [facility]: [facility] - General (General Practice)  Patient Active Problem List   Diagnosis Date Noted  . Abscess 09/07/2016  . Palliative care by specialist   . Goals of care, counseling/discussion   . Advance care planning   . Anxiety state   . Elevated LFTs   . ARF (acute renal failure) (HCC)   . Depression with anxiety 08/05/2016  . Malnutrition of moderate degree 04/12/2016  . Type 2 diabetes mellitus with hyperglycemia, with long-term current use of insulin (HCC) 04/12/2016  . Cough   . DNR (do not resuscitate) discussion   . Palliative care encounter   . Polysubstance abuse   . Pleural effusion   . Insulin dependent diabetes mellitus (HCC)   . Hepatitis, viral   . Pleural effusion on right   . CHF (congestive heart failure) (HCC) 09/07/2015  . Elevated troponin I level 03/10/2015  . Alcohol withdrawal (HCC)   . Type 2 diabetes mellitus with complication (HCC)   . 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 Hypertensive heart disease with chronic systolic congestive heart failure (HCC)   . 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 Alcohol dependence with withdrawal delirium (HCC) 02/23/2014  . Hyponatremia 02/23/2014  . Transaminitis 02/22/2014  . Protein-calorie malnutrition (HCC) 02/22/2014  . Other pancytopenia (HCC) 02/22/2014  . Hyperkalemia 02/22/2014  . Hypomagnesemia 02/22/2014  . Alcoholic ketoacidosis 02/20/2014  . ETOH abuse 12/21/2012  . Cocaine dependence (HCC) 12/21/2012  . Substance induced mood disorder (HCC) 12/21/2012  . Non Obstructive CAD (coronary artery disease) 08/14/2012  . Cardiomyopathy, nonischemic (HCC) 08/14/2012  . Shortness of breath 08/11/2012  . Diabetes mellitus, type II (HCC) 08/11/2012  . History of substance abuse 08/11/2012  . Orthopnea 08/11/2012    Current Outpatient Prescriptions:   .  acetaminophen (TYLENOL) 325 MG tablet, Take 650 mg by mouth every 6 (six) hours as needed for mild pain., Disp: , Rfl:  .  aspirin 81 MG chewable tablet, Chew 1 tablet (81 mg total) by mouth daily., Disp: 90 tablet, Rfl: 3 .  atorvastatin (LIPITOR) 20 MG tablet, Take 1 tablet (20 mg total) by mouth daily at 6 PM., Disp: 90 tablet, Rfl: 3 .  digoxin (LANOXIN) 0.125 MG tablet, Take 1 tablet (0.125 mg total) by mouth daily., Disp: 30 tablet, Rfl: 0 .  furosemide (LASIX) 40 MG tablet, Take 2 tablets (80 mg total) by mouth daily., Disp: 180 tablet, Rfl: 3 .  hydrALAZINE (APRESOLINE) 25 MG tablet, Take 1 tablet (25 mg total) by mouth 3 (three) times daily., Disp: 270 tablet, Rfl: 2 .  insulin glargine (LANTUS) 100 UNIT/ML injection, Inject 8 Units into the skin every morning., Disp: , Rfl:  .  isosorbide mononitrate (IMDUR) 30 MG 24 hr tablet, Take 1 tablet (30 mg total) by mouth daily., Disp: 30 tablet, Rfl: 0 .  losartan (COZAAR) 50 MG tablet, Take 1 tablet (50 mg total) by mouth daily., Disp: 30 tablet, Rfl: 0 .  metFORMIN (GLUCOPHAGE) 1000 MG tablet, Take 1 tablet (1,000 mg total) by mouth 2 (two) times daily with a meal. For high blood sugar control, Disp: , Rfl:  .  potassium chloride (K-DUR,KLOR-CON) 10 MEQ tablet, Take 2 tablets (20 mEq total) by mouth daily., Disp: 60 tablet, Rfl: 0 .  sertraline (ZOLOFT) 100 MG tablet, Take 100 mg by mouth at bedtime. ,  Disp: , Rfl:  .  traZODone (DESYREL) 100 MG tablet, Take 1 tablet (100 mg total) by mouth at bedtime. For sleep, Disp: 30 tablet, Rfl: 0 .  ALPRAZolam (XANAX) 1 MG tablet, Take 1 mg by mouth every 8 (eight) hours as needed for anxiety., Disp: , Rfl:  .  ARIPiprazole (ABILIFY) 10 MG tablet, Take 5 mg by mouth daily., Disp: , Rfl:  .  buPROPion (WELLBUTRIN) 100 MG tablet, Take 100 mg by mouth daily. , Disp: , Rfl:  .  ibuprofen (ADVIL,MOTRIN) 600 MG tablet, Take 1 tablet (600 mg total) by mouth every 6 (six) hours as needed. (Patient not  taking: Reported on 09/21/2016), Disp: 30 tablet, Rfl: 0 No Known Allergies   Social History   Social History  . Marital status: Widowed    Spouse name: N/A  . Number of children: N/A  . Years of education: N/A   Occupational History  . Not on file.   Social History Main Topics  . Smoking status: Current Every Day Smoker    Packs/day: 0.50    Types: Cigarettes  . Smokeless tobacco: Never Used  . Alcohol use No     Comment: sts he hasn't drunk in 6 months   . Drug use: Yes    Frequency: 7.0 times per week    Types: Cocaine, Marijuana     Comment: last use about 6 months ago  . Sexual activity: Yes   Other Topics Concern  . Not on file   Social History Narrative  . No narrative on file    Physical Exam      SAFE - 08/24/16 1600      Situation   Admitting diagnosis chf   Heart failure history Exisiting   Comorbidities DM;HTN;Hx MI/CAD   Readmitted within 30 days Yes     Assessment   Lives alone No  lives with mom    Primary support person mom   Mode of transportation needs assistance   Assistance needed --  transportation   Other services involved Home Health;[facility]   Home equipement Cane     Weight   Weighs self daily No   Scale provided Yes   Records on weight chart No     Resources   Has "Living better w/heart failure" book No   Has HF Zone tool No   Able to identify yellow zone signs/when to call MD No   Records zone daily No     Medications   Uses a pill box No   Difficulty obtaining medications No   Mail order medications Yes   New medications at home today No   Missed one or more doses of medications per week No     Nutrition   Patient receives meals on wheels No   Patient follows low sodium diet Yes   Has foods at home that meet the current recommended diet No   Patient follows low sugar/card diet No   Nutritional concerns/issues no     Activity Level   ADL's/Mobility Independent     Urine   Difficulty urinating No   Changes in  urine None        Future Appointments Date Time Provider Department Center  10/19/2016 1:15 PM [facility] None  11/22/2016 12:00 PM Laurey Morale, MD [facility] None   BP 120/68   Pulse 78   Resp 16   SpO2 98%  Weight yesterday-144 Last visit weight-145 CBG PTA-187  Pt reports he is doing ok.  He ambulated from the back bedroom into the living room without difficulty. He reports he had a fall a week and half ago due to his legs gave out. He has some soreness to his left side rib area, he did not get that evaluated by hosp or doc office. Pt reports no sob. No dizziness.  Pt is out of xanax, abilify, and wellbutrin,  He also needs hydralazine sent out.  A couple missed doses noted-meds verified and pill box refilled.  He only got atorvastatin, sertraline, metformin lasix and trazadone delivered. He goes to [place] next month and will take a list of meds plus his bottles and get his PCP while he is there to send over all new rx so he can get them through [place].  He did not weigh today , he states sometimes his weight gets up to 150 and that happened about 4 days ago. He states at that time he felt full and bloated and it was something he ate so I advised him to avoid those sodium filled foods that cause that. No swelling noted.   ACTION: Home visit completed  Kerry Hough, EMT-Paramedic 10/06/16

## 2016-10-07 DIAGNOSIS — I110 Hypertensive heart disease with heart failure: Secondary | ICD-10-CM | POA: Diagnosis not present

## 2016-10-07 DIAGNOSIS — I5043 Acute on chronic combined systolic (congestive) and diastolic (congestive) heart failure: Secondary | ICD-10-CM | POA: Diagnosis not present

## 2016-10-07 DIAGNOSIS — E1165 Type 2 diabetes mellitus with hyperglycemia: Secondary | ICD-10-CM | POA: Diagnosis not present

## 2016-10-07 DIAGNOSIS — Z794 Long term (current) use of insulin: Secondary | ICD-10-CM | POA: Diagnosis not present

## 2016-10-07 DIAGNOSIS — R2681 Unsteadiness on feet: Secondary | ICD-10-CM | POA: Diagnosis not present

## 2016-10-07 DIAGNOSIS — I2510 Atherosclerotic heart disease of native coronary artery without angina pectoris: Secondary | ICD-10-CM | POA: Diagnosis not present

## 2016-10-11 DIAGNOSIS — I5043 Acute on chronic combined systolic (congestive) and diastolic (congestive) heart failure: Secondary | ICD-10-CM | POA: Diagnosis not present

## 2016-10-11 DIAGNOSIS — Z794 Long term (current) use of insulin: Secondary | ICD-10-CM | POA: Diagnosis not present

## 2016-10-11 DIAGNOSIS — R2681 Unsteadiness on feet: Secondary | ICD-10-CM | POA: Diagnosis not present

## 2016-10-11 DIAGNOSIS — I2510 Atherosclerotic heart disease of native coronary artery without angina pectoris: Secondary | ICD-10-CM | POA: Diagnosis not present

## 2016-10-11 DIAGNOSIS — E1165 Type 2 diabetes mellitus with hyperglycemia: Secondary | ICD-10-CM | POA: Diagnosis not present

## 2016-10-11 DIAGNOSIS — I110 Hypertensive heart disease with heart failure: Secondary | ICD-10-CM | POA: Diagnosis not present

## 2016-10-18 DIAGNOSIS — R2681 Unsteadiness on feet: Secondary | ICD-10-CM | POA: Diagnosis not present

## 2016-10-18 DIAGNOSIS — Z794 Long term (current) use of insulin: Secondary | ICD-10-CM | POA: Diagnosis not present

## 2016-10-18 DIAGNOSIS — I2510 Atherosclerotic heart disease of native coronary artery without angina pectoris: Secondary | ICD-10-CM | POA: Diagnosis not present

## 2016-10-18 DIAGNOSIS — I5043 Acute on chronic combined systolic (congestive) and diastolic (congestive) heart failure: Secondary | ICD-10-CM | POA: Diagnosis not present

## 2016-10-18 DIAGNOSIS — I110 Hypertensive heart disease with heart failure: Secondary | ICD-10-CM | POA: Diagnosis not present

## 2016-10-18 DIAGNOSIS — E1165 Type 2 diabetes mellitus with hyperglycemia: Secondary | ICD-10-CM | POA: Diagnosis not present

## 2016-10-19 ENCOUNTER — Ambulatory Visit (HOSPITAL_COMMUNITY)
Admission: RE | Admit: 2016-10-19 | Discharge: 2016-10-19 | Disposition: A | Discharge status: Home / Self Care | Payer: Medicare Other | Source: Ambulatory Visit | Attending: Internal Medicine | Admitting: Internal Medicine

## 2016-10-19 VITALS — BP 108/62 | HR 78 | Wt 150.0 lb

## 2016-10-19 DIAGNOSIS — I110 Hypertensive heart disease with heart failure: Secondary | ICD-10-CM | POA: Insufficient documentation

## 2016-10-19 DIAGNOSIS — E119 Type 2 diabetes mellitus without complications: Secondary | ICD-10-CM | POA: Insufficient documentation

## 2016-10-19 DIAGNOSIS — F1410 Cocaine abuse, uncomplicated: Secondary | ICD-10-CM | POA: Diagnosis not present

## 2016-10-19 DIAGNOSIS — F17210 Nicotine dependence, cigarettes, uncomplicated: Secondary | ICD-10-CM | POA: Insufficient documentation

## 2016-10-19 DIAGNOSIS — F1010 Alcohol abuse, uncomplicated: Secondary | ICD-10-CM | POA: Insufficient documentation

## 2016-10-19 DIAGNOSIS — Z9119 Patient's noncompliance with other medical treatment and regimen: Secondary | ICD-10-CM | POA: Insufficient documentation

## 2016-10-19 DIAGNOSIS — I420 Dilated cardiomyopathy: Secondary | ICD-10-CM

## 2016-10-19 DIAGNOSIS — L0231 Cutaneous abscess of buttock: Secondary | ICD-10-CM | POA: Insufficient documentation

## 2016-10-19 DIAGNOSIS — I2510 Atherosclerotic heart disease of native coronary artery without angina pectoris: Secondary | ICD-10-CM | POA: Diagnosis not present

## 2016-10-19 DIAGNOSIS — I5022 Chronic systolic (congestive) heart failure: Secondary | ICD-10-CM

## 2016-10-19 DIAGNOSIS — B1920 Unspecified viral hepatitis C without hepatic coma: Secondary | ICD-10-CM | POA: Insufficient documentation

## 2016-10-19 LAB — BASIC METABOLIC PANEL
ANION GAP: 7 (ref 5–15)
BUN: 13 mg/dL (ref 6–20)
CHLORIDE: 108 mmol/L (ref 101–111)
CO2: 24 mmol/L (ref 22–32)
Calcium: 8.6 mg/dL — ABNORMAL LOW (ref 8.9–10.3)
Creatinine, Ser: 0.92 mg/dL (ref 0.61–1.24)
GFR calc Af Amer: >60 mL/min (ref >60)
GFR calc non Af Amer: >60 mL/min (ref >60)
Glucose, Bld: 197 mg/dL — ABNORMAL HIGH (ref 65–99)
Potassium: 4.9 mmol/L (ref 3.5–5.1)
Sodium: 139 mmol/L (ref 135–145)

## 2016-10-19 LAB — DIGOXIN LEVEL: Digoxin Level: <0.2 ng/mL — ABNORMAL LOW (ref 0.8–2.0)

## 2016-10-19 LAB — BRAIN NATRIURETIC PEPTIDE: B Natriuretic Peptide: 433.4 pg/mL — ABNORMAL HIGH (ref 0.0–100.0)

## 2016-10-19 MED ORDER — SACUBITRIL-VALSARTAN 24-26 MG PO TABS: 1.0000 | ORAL_TABLET | Freq: Two times a day (BID) | ORAL | 5 refills | Status: DC

## 2016-10-19 MED ORDER — SACUBITRIL-VALSARTAN 24-26 MG PO TABS: 1.0000 | ORAL_TABLET | Freq: Two times a day (BID) | ORAL | 11 refills | Status: DC

## 2016-10-19 NOTE — Progress Notes (Signed)
MD: Dr. Shirlee Latch  HPI:   [name] is a pleasant 61 y.o. AA male with h/o of NICM, Combined CHF, HTN, DM, CAD, ETOH abuse, and cocaine abuse.   Admitted 4/13 -> 08/13/16 with A/C systolic CHF. PTA had been out of HF meds for at least 3 months and was drinking at least three 40 oz bees per day along with frequent cocaine use. UDS + for cocaine. PICC placed and initial mixed venous sat 40%. Stared on milrinone and diuresed. Weaned off inotropes as not candidate for home treatment with ongoing substance abuse. Discharged to SNF for rehab.   Patient was last seen by Dr. Shirlee Latch 09/21/2016. At that time, hydralazine was increased to 25 mg TID.   Today, he presents for pharmacy-led heart failure medication titration visit. He presented with no pill bottles or list, but good recollection of his medication regimen. He endorses good adherence, and that he uses a pill box that is filled by paramedicine weekly. Patient reports not taking his medications this morning as he woke up late, last dose of digoxin 10/18/16 at 10am per patient. During previous clinic visit on 09/21/2016, patient reported occasional lightheadedness which he endorses again today, typically upon standing too quickly or after his daily walks. Patient states he is often tired but denies SOB or DOE. States that he has some tingling in his feet but denies edema. Patient is non-edematous on exam. Patient reports smoking only 1-2 cigarettes per day and drinking 2-3 24 oz. beers per day. Endorses variable adherence to sodium restricted diet, limited by finances and food scarcity.   . Shortness of breath/dyspnea on exertion? No . Orthopnea/PND? Yes, reports sleeping on 2 pillows, stable.  . Edema? No . Lightheadedness/dizziness? Yes, reports after walking, and if he gets up too quickly.  . Daily weights at home? Yes, ~150 lbs at home, stable . Blood pressure/heart rate monitoring at home? yes . Following low-sodium/fluid-restricted diet?  Patient reports eating some canned foods, oatmeal, cereal, chicken, fast food fried fish   HF Medications: Digoxin 0.125 mg PO daily Furosemide 40 mg PO BID Hydralazine 25 mg PO TID Isosorbide mononitrate 30 mg PO daily KCl 20 mEq PO daily Losartan 50 mg PO daily  Has the patient been experiencing any side effects to the medications prescribed?  Dizziness.   Does the patient have any problems obtaining medications due to transportation or finances?   No, patient obtains medications from the [facility].   Understanding of regimen: good Understanding of indications: good Potential of compliance: good Patient understands to avoid NSAIDs. Patient understands to avoid decongestants.    Pertinent Lab Values: . 10/06/2016 Serum creatinine 0.78, CO2 17, Potassium 3.9, Sodium 137, BNP 1,292.9.  . 10/19/2016 Serum creatinine 0.92 (BL ~0.8), CO2 24, Potassium 4.9, Sodium 139, BNP 433.4, Digoxin <0.2 (last taken ~28 hours ago, expected to be low)   Vital Signs: . Weight: 150 lbs (dry weight: 150 lbs) . Blood pressure: 108/62 mmHg  . Heart rate: 78 bpm    Assessment: 1. Chronic systolic CHF with recent admission for cardiogenic shock  - Echo 08/07/2016 LVEF 10-15%, due to NICM.  - NYHA class II-III class symptoms.  - Appears euvolemic on exam - Discontinue losartan 50 mg daily - Initiate Entresto 24-26 mg BID - given 30 day free card and printed Rx to bring to [place] - Continue lasix 40 mg BID, potassium 20 mEq daily, hydralazine 25 mg TID, imdur 30 mg daily, digoxin 0.125 daily.  - Will continue to  hold off on BB with +cocaine UDS at last hospitalization - Counseled patient to avoid fried fast foods, as they are high on sodium. Counseled patient to use Mrs. Dash for seasoning food.  - Basic disease state pathophysiology, medication indication, mechanism and side effects reviewed at length with patient and he verbalized understanding 2. CAD (non obstructive on LHC 2014) - No ACS  symptoms - Continues on ASA and statin 3. ETOH abuse - States he has been taking 2-3 24 oz. beers per day - Encouraged complete cessation but congratulated on decreased consumption  4. Substance Abuse - States he has remained abstinent from cocaine and any other substance since discharge. 5. Tobacco Abuse - Has cut back to 1-2 cigarettes a day - Encouraged complete cessation but congratulated on decreased consumption  6. Non-compliance - Improved with paramedicine.  - Taking all meds, although did not take AM meds today.  7. Buttock abscess - Last seen in ED. Completed ABX s/p I&D.  - No complaints today 8. Hepatitis C - + antibody in 2015, and quantitative labs drawn in 2017. - [facility]   Plan: 1) Medication changes: Based on clinical presentation, vital signs and recent labs will stop losartan 50 mg daily and start Entresto 24-26 mg PO BID.  2) Labs: BMET, BNP and Dig level today even though expected to be low given last dose (concerned for toxicity) and BMET in 1 week 3) Follow-up: Dr. Shirlee Latch on 11/22/16  Seen with: Devota Pace, PharmD, Pharmacy Resident Marlinda Mike, PharmD Candidate    Tyler Deis. Bonnye Fava, PharmD, BCPS, CPP Clinical Pharmacist Pager: [phone] Phone: [phone] 10/19/2016 1:34 PM

## 2016-10-19 NOTE — Patient Instructions (Signed)
Thank you for coming to see [place] today!  STOP taking losartan  START taking Entresto 24-26 mg one tablet twice a day.  Use the 30-day free card at [facility] to get your first month of Entresto. [facility].    Labs today, we will call you with any issues.   Come back for labs on Thursday at 10 am, we will call you with any issues.

## 2016-10-20 DIAGNOSIS — I110 Hypertensive heart disease with heart failure: Secondary | ICD-10-CM | POA: Diagnosis not present

## 2016-10-20 DIAGNOSIS — I2510 Atherosclerotic heart disease of native coronary artery without angina pectoris: Secondary | ICD-10-CM | POA: Diagnosis not present

## 2016-10-20 DIAGNOSIS — I5043 Acute on chronic combined systolic (congestive) and diastolic (congestive) heart failure: Secondary | ICD-10-CM | POA: Diagnosis not present

## 2016-10-20 DIAGNOSIS — E1165 Type 2 diabetes mellitus with hyperglycemia: Secondary | ICD-10-CM | POA: Diagnosis not present

## 2016-10-20 DIAGNOSIS — R2681 Unsteadiness on feet: Secondary | ICD-10-CM | POA: Diagnosis not present

## 2016-10-20 DIAGNOSIS — Z794 Long term (current) use of insulin: Secondary | ICD-10-CM | POA: Diagnosis not present

## 2016-10-27 ENCOUNTER — Inpatient Hospital Stay (HOSPITAL_COMMUNITY): Admission: RE | Admit: 2016-10-27 | Payer: Self-pay | Source: Ambulatory Visit

## 2016-11-03 ENCOUNTER — Telehealth (HOSPITAL_COMMUNITY): Payer: Self-pay

## 2016-11-03 NOTE — Telephone Encounter (Signed)
I called pt last week and his mom reported he was not home, I have called him several times this week and nobody answered and I left 2 messages but no return call.

## 2016-11-14 ENCOUNTER — Telehealth (HOSPITAL_COMMUNITY): Payer: Self-pay

## 2016-11-14 NOTE — Telephone Encounter (Signed)
Attempted call to pt again--phone went straight to message stating the number I dialed (the one listed in EPIC) is not a working number.

## 2016-11-22 ENCOUNTER — Telehealth (HOSPITAL_COMMUNITY): Payer: Self-pay | Admitting: Cardiology

## 2016-11-22 ENCOUNTER — Inpatient Hospital Stay (HOSPITAL_COMMUNITY): Admission: RE | Admit: 2016-11-22 | Payer: Self-pay | Source: Ambulatory Visit | Admitting: Cardiology

## 2016-11-22 NOTE — Telephone Encounter (Signed)
Pt missed his appt scheduled for today with Dr. Shirlee Latch.  Pt had requested transporation to this appt, which we had scheduled.  Spoke with patient's sister, [name].  She advised that that pt and their mother have moved out of the home they were living in & the house is being sold.  Ms. [name] states that she will contact pt (she does not have a current phone number for pt) and have him call us to reschedule his appt.  Spoke with Katie in Paramedicine & she is aware of above.

## 2016-12-04 ENCOUNTER — Emergency Department (HOSPITAL_COMMUNITY): Payer: Medicare Other

## 2016-12-04 ENCOUNTER — Inpatient Hospital Stay (HOSPITAL_COMMUNITY)
Admission: EM | Admit: 2016-12-04 | Discharge: 2016-12-07 | DRG: 280 | Disposition: A | Discharge status: Skilled Nursing Facility | Payer: Medicare Other | Attending: Internal Medicine | Admitting: Internal Medicine

## 2016-12-04 ENCOUNTER — Encounter (HOSPITAL_COMMUNITY): Payer: Self-pay | Admitting: Emergency Medicine

## 2016-12-04 DIAGNOSIS — I21A1 Myocardial infarction type 2: Secondary | ICD-10-CM | POA: Diagnosis present

## 2016-12-04 DIAGNOSIS — E119 Type 2 diabetes mellitus without complications: Secondary | ICD-10-CM | POA: Diagnosis not present

## 2016-12-04 DIAGNOSIS — I256 Silent myocardial ischemia: Secondary | ICD-10-CM | POA: Diagnosis not present

## 2016-12-04 DIAGNOSIS — Z833 Family history of diabetes mellitus: Secondary | ICD-10-CM

## 2016-12-04 DIAGNOSIS — I110 Hypertensive heart disease with heart failure: Principal | ICD-10-CM | POA: Diagnosis present

## 2016-12-04 DIAGNOSIS — Z79899 Other long term (current) drug therapy: Secondary | ICD-10-CM | POA: Diagnosis not present

## 2016-12-04 DIAGNOSIS — Z8674 Personal history of sudden cardiac arrest: Secondary | ICD-10-CM

## 2016-12-04 DIAGNOSIS — E785 Hyperlipidemia, unspecified: Secondary | ICD-10-CM | POA: Diagnosis not present

## 2016-12-04 DIAGNOSIS — F418 Other specified anxiety disorders: Secondary | ICD-10-CM | POA: Diagnosis present

## 2016-12-04 DIAGNOSIS — Z91199 Patient's noncompliance with other medical treatment and regimen due to unspecified reason: Secondary | ICD-10-CM

## 2016-12-04 DIAGNOSIS — E871 Hypo-osmolality and hyponatremia: Secondary | ICD-10-CM | POA: Diagnosis present

## 2016-12-04 DIAGNOSIS — I509 Heart failure, unspecified: Secondary | ICD-10-CM

## 2016-12-04 DIAGNOSIS — R1319 Other dysphagia: Secondary | ICD-10-CM | POA: Diagnosis not present

## 2016-12-04 DIAGNOSIS — I428 Other cardiomyopathies: Secondary | ICD-10-CM | POA: Diagnosis present

## 2016-12-04 DIAGNOSIS — R079 Chest pain, unspecified: Secondary | ICD-10-CM | POA: Diagnosis not present

## 2016-12-04 DIAGNOSIS — Z9189 Other specified personal risk factors, not elsewhere classified: Secondary | ICD-10-CM

## 2016-12-04 DIAGNOSIS — F17210 Nicotine dependence, cigarettes, uncomplicated: Secondary | ICD-10-CM | POA: Diagnosis present

## 2016-12-04 DIAGNOSIS — F329 Major depressive disorder, single episode, unspecified: Secondary | ICD-10-CM | POA: Diagnosis present

## 2016-12-04 DIAGNOSIS — Z66 Do not resuscitate: Secondary | ICD-10-CM | POA: Diagnosis not present

## 2016-12-04 DIAGNOSIS — I5021 Acute systolic (congestive) heart failure: Secondary | ICD-10-CM | POA: Diagnosis not present

## 2016-12-04 DIAGNOSIS — F1010 Alcohol abuse, uncomplicated: Secondary | ICD-10-CM | POA: Diagnosis present

## 2016-12-04 DIAGNOSIS — I2510 Atherosclerotic heart disease of native coronary artery without angina pectoris: Secondary | ICD-10-CM | POA: Diagnosis not present

## 2016-12-04 DIAGNOSIS — Z9119 Patient's noncompliance with other medical treatment and regimen: Secondary | ICD-10-CM

## 2016-12-04 DIAGNOSIS — Z7982 Long term (current) use of aspirin: Secondary | ICD-10-CM

## 2016-12-04 DIAGNOSIS — I2699 Other pulmonary embolism without acute cor pulmonale: Secondary | ICD-10-CM | POA: Diagnosis present

## 2016-12-04 DIAGNOSIS — B1920 Unspecified viral hepatitis C without hepatic coma: Secondary | ICD-10-CM | POA: Diagnosis present

## 2016-12-04 DIAGNOSIS — Z9861 Coronary angioplasty status: Secondary | ICD-10-CM

## 2016-12-04 DIAGNOSIS — F1210 Cannabis abuse, uncomplicated: Secondary | ICD-10-CM | POA: Diagnosis present

## 2016-12-04 DIAGNOSIS — I252 Old myocardial infarction: Secondary | ICD-10-CM

## 2016-12-04 DIAGNOSIS — I5043 Acute on chronic combined systolic (congestive) and diastolic (congestive) heart failure: Secondary | ICD-10-CM | POA: Diagnosis not present

## 2016-12-04 DIAGNOSIS — Z91128 Patient's intentional underdosing of medication regimen for other reason: Secondary | ICD-10-CM | POA: Diagnosis not present

## 2016-12-04 DIAGNOSIS — T50996A Underdosing of other drugs, medicaments and biological substances, initial encounter: Secondary | ICD-10-CM | POA: Diagnosis not present

## 2016-12-04 DIAGNOSIS — Z794 Long term (current) use of insulin: Secondary | ICD-10-CM

## 2016-12-04 DIAGNOSIS — I700 Atherosclerosis of aorta: Secondary | ICD-10-CM | POA: Diagnosis not present

## 2016-12-04 DIAGNOSIS — F1410 Cocaine abuse, uncomplicated: Secondary | ICD-10-CM | POA: Diagnosis present

## 2016-12-04 DIAGNOSIS — I259 Chronic ischemic heart disease, unspecified: Secondary | ICD-10-CM

## 2016-12-04 DIAGNOSIS — I5022 Chronic systolic (congestive) heart failure: Secondary | ICD-10-CM

## 2016-12-04 DIAGNOSIS — R278 Other lack of coordination: Secondary | ICD-10-CM | POA: Diagnosis not present

## 2016-12-04 DIAGNOSIS — F419 Anxiety disorder, unspecified: Secondary | ICD-10-CM | POA: Diagnosis not present

## 2016-12-04 DIAGNOSIS — R2689 Other abnormalities of gait and mobility: Secondary | ICD-10-CM | POA: Diagnosis not present

## 2016-12-04 DIAGNOSIS — I5023 Acute on chronic systolic (congestive) heart failure: Secondary | ICD-10-CM | POA: Diagnosis not present

## 2016-12-04 DIAGNOSIS — Z8249 Family history of ischemic heart disease and other diseases of the circulatory system: Secondary | ICD-10-CM

## 2016-12-04 DIAGNOSIS — Z590 Homelessness: Secondary | ICD-10-CM

## 2016-12-04 DIAGNOSIS — M6281 Muscle weakness (generalized): Secondary | ICD-10-CM | POA: Diagnosis not present

## 2016-12-04 LAB — BASIC METABOLIC PANEL
Anion gap: 12 (ref 5–15)
BUN: 9 mg/dL (ref 6–20)
CO2: 18 mmol/L — ABNORMAL LOW (ref 22–32)
CREATININE: 0.88 mg/dL (ref 0.61–1.24)
Calcium: 8.3 mg/dL — ABNORMAL LOW (ref 8.9–10.3)
Chloride: 106 mmol/L (ref 101–111)
GFR calc Af Amer: >60 mL/min (ref >60)
Glucose, Bld: 202 mg/dL — ABNORMAL HIGH (ref 65–99)
Potassium: 4.2 mmol/L (ref 3.5–5.1)
SODIUM: 136 mmol/L (ref 135–145)

## 2016-12-04 LAB — RAPID URINE DRUG SCREEN, HOSP PERFORMED
Amphetamines: NOT DETECTED
BARBITURATES: NOT DETECTED
BENZODIAZEPINES: NOT DETECTED
Cocaine: POSITIVE — AB
Opiates: NOT DETECTED
Tetrahydrocannabinol: NOT DETECTED

## 2016-12-04 LAB — HEPATIC FUNCTION PANEL
ALT: 29 U/L (ref 17–63)
AST: 61 U/L — AB (ref 15–41)
Albumin: 2.7 g/dL — ABNORMAL LOW (ref 3.5–5.0)
Alkaline Phosphatase: 101 U/L (ref 38–126)
BILIRUBIN DIRECT: 0.4 mg/dL (ref 0.1–0.5)
BILIRUBIN INDIRECT: 0.7 mg/dL (ref 0.3–0.9)
BILIRUBIN TOTAL: 1.1 mg/dL (ref 0.3–1.2)
Total Protein: 7.4 g/dL (ref 6.5–8.1)

## 2016-12-04 LAB — CBC
HCT: 42.6 % (ref 39.0–52.0)
Hemoglobin: 15.1 g/dL (ref 13.0–17.0)
MCH: 30.8 pg (ref 26.0–34.0)
MCHC: 35.4 g/dL (ref 30.0–36.0)
MCV: 86.8 fL (ref 78.0–100.0)
Platelets: 282 10*3/uL (ref 150–400)
RBC: 4.91 MIL/uL (ref 4.22–5.81)
RDW: 13.2 % (ref 11.5–15.5)
WBC: 5.3 10*3/uL (ref 4.0–10.5)

## 2016-12-04 LAB — I-STAT TROPONIN, ED: Troponin i, poc: 0.05 ng/mL (ref 0.00–0.08)

## 2016-12-04 LAB — TROPONIN I
TROPONIN I: 0.09 ng/mL — AB (ref <0.03)
Troponin I: 0.04 ng/mL (ref <0.03)
Troponin I: 0.06 ng/mL (ref <0.03)

## 2016-12-04 LAB — MRSA PCR SCREENING: MRSA by PCR: NEGATIVE

## 2016-12-04 LAB — HEPARIN LEVEL (UNFRACTIONATED): Heparin Unfractionated: 0.3 IU/mL (ref 0.30–0.70)

## 2016-12-04 LAB — BRAIN NATRIURETIC PEPTIDE: B NATRIURETIC PEPTIDE 5: 1958.9 pg/mL — AB (ref 0.0–100.0)

## 2016-12-04 LAB — DIGOXIN LEVEL

## 2016-12-04 LAB — D-DIMER, QUANTITATIVE: D-Dimer, Quant: 1.44 ug/mL-FEU — ABNORMAL HIGH (ref 0.00–0.50)

## 2016-12-04 LAB — GLUCOSE, CAPILLARY: Glucose-Capillary: 210 mg/dL — ABNORMAL HIGH (ref 65–99)

## 2016-12-04 LAB — MAGNESIUM: Magnesium: 1.4 mg/dL — ABNORMAL LOW (ref 1.7–2.4)

## 2016-12-04 LAB — ETHANOL

## 2016-12-04 MED ORDER — NICOTINE 7 MG/24HR TD PT24
7.0000 mg | MEDICATED_PATCH | Freq: Every day | TRANSDERMAL | Status: DC
  Administered 2016-12-04 – 2016-12-07 (×4): 7 mg via TRANSDERMAL
  Filled 2016-12-04 (×4): qty 1

## 2016-12-04 MED ORDER — FUROSEMIDE 10 MG/ML IJ SOLN
40.0000 mg | Freq: Once | INTRAMUSCULAR | Status: AC
  Administered 2016-12-04: 40 mg via INTRAVENOUS
  Filled 2016-12-04: qty 4

## 2016-12-04 MED ORDER — HEPARIN (PORCINE) IN NACL 100-0.45 UNIT/ML-% IJ SOLN
1600.0000 [IU]/h | INTRAMUSCULAR | Status: DC
  Administered 2016-12-04: 1150 [IU]/h via INTRAVENOUS
  Filled 2016-12-04 (×2): qty 250

## 2016-12-04 MED ORDER — ONDANSETRON HCL 4 MG/2ML IJ SOLN: 4.0000 mg | Freq: Four times a day (QID) | INTRAMUSCULAR | Status: DC | PRN

## 2016-12-04 MED ORDER — DIGOXIN 125 MCG PO TABS
0.1250 mg | ORAL_TABLET | Freq: Every day | ORAL | Status: DC
  Administered 2016-12-04 – 2016-12-07 (×4): 0.125 mg via ORAL
  Filled 2016-12-04 (×4): qty 1

## 2016-12-04 MED ORDER — SODIUM CHLORIDE 0.9% FLUSH
3.0000 mL | INTRAVENOUS | Status: DC | PRN
Start: 2016-12-04 — End: 2016-12-06

## 2016-12-04 MED ORDER — LORAZEPAM 2 MG/ML IJ SOLN
1.0000 mg | Freq: Once | INTRAMUSCULAR | Status: AC
  Administered 2016-12-04: 1 mg via INTRAVENOUS
  Filled 2016-12-04: qty 1

## 2016-12-04 MED ORDER — ACETAMINOPHEN 325 MG PO TABS: 650.0000 mg | ORAL_TABLET | ORAL | Status: DC | PRN

## 2016-12-04 MED ORDER — POTASSIUM CHLORIDE CRYS ER 20 MEQ PO TBCR
20.0000 meq | EXTENDED_RELEASE_TABLET | Freq: Every day | ORAL | Status: DC
  Administered 2016-12-04 – 2016-12-05 (×2): 20 meq via ORAL
  Filled 2016-12-04 (×2): qty 1

## 2016-12-04 MED ORDER — ASPIRIN 81 MG PO CHEW
324.0000 mg | CHEWABLE_TABLET | Freq: Once | ORAL | Status: AC
  Administered 2016-12-04: 324 mg via ORAL
  Filled 2016-12-04: qty 4

## 2016-12-04 MED ORDER — NITROGLYCERIN 0.4 MG SL SUBL
0.4000 mg | SUBLINGUAL_TABLET | SUBLINGUAL | Status: DC | PRN
  Administered 2016-12-04: 0.4 mg via SUBLINGUAL
  Filled 2016-12-04: qty 1

## 2016-12-04 MED ORDER — INSULIN ASPART 100 UNIT/ML ~~LOC~~ SOLN
0.0000 [IU] | Freq: Three times a day (TID) | SUBCUTANEOUS | Status: DC
Start: 2016-12-04 — End: 2016-12-07
  Administered 2016-12-04: 3 [IU] via SUBCUTANEOUS
  Administered 2016-12-05: 2 [IU] via SUBCUTANEOUS
  Administered 2016-12-05: 1 [IU] via SUBCUTANEOUS
  Administered 2016-12-06 (×2): 3 [IU] via SUBCUTANEOUS
  Administered 2016-12-07 (×2): 2 [IU] via SUBCUTANEOUS

## 2016-12-04 MED ORDER — ATORVASTATIN CALCIUM 20 MG PO TABS
20.0000 mg | ORAL_TABLET | Freq: Every day | ORAL | Status: DC
  Administered 2016-12-04 – 2016-12-06 (×3): 20 mg via ORAL
  Filled 2016-12-04 (×3): qty 1

## 2016-12-04 MED ORDER — IOPAMIDOL (ISOVUE-370) INJECTION 76%
INTRAVENOUS | Status: AC
  Administered 2016-12-04: 100 mL
  Filled 2016-12-04: qty 100

## 2016-12-04 MED ORDER — ISOSORBIDE MONONITRATE ER 30 MG PO TB24
30.0000 mg | ORAL_TABLET | Freq: Every day | ORAL | Status: DC
  Administered 2016-12-04 – 2016-12-07 (×4): 30 mg via ORAL
  Filled 2016-12-04 (×4): qty 1

## 2016-12-04 MED ORDER — SACUBITRIL-VALSARTAN 24-26 MG PO TABS
1.0000 | ORAL_TABLET | Freq: Two times a day (BID) | ORAL | Status: DC
  Administered 2016-12-04 – 2016-12-07 (×7): 1 via ORAL
  Filled 2016-12-04 (×9): qty 1

## 2016-12-04 MED ORDER — HYDRALAZINE HCL 25 MG PO TABS
25.0000 mg | ORAL_TABLET | Freq: Three times a day (TID) | ORAL | Status: DC
  Administered 2016-12-04 – 2016-12-07 (×9): 25 mg via ORAL
  Filled 2016-12-04 (×9): qty 1

## 2016-12-04 MED ORDER — DIGOXIN 125 MCG PO TABS: 0.1250 mg | ORAL_TABLET | Freq: Every day | ORAL | Status: DC

## 2016-12-04 MED ORDER — INSULIN GLARGINE 100 UNIT/ML ~~LOC~~ SOLN
20.0000 [IU] | Freq: Every day | SUBCUTANEOUS | Status: DC
  Administered 2016-12-04 – 2016-12-06 (×3): 20 [IU] via SUBCUTANEOUS
  Filled 2016-12-04 (×5): qty 0.2

## 2016-12-04 MED ORDER — SODIUM CHLORIDE 0.9 % IV SOLN: 250.0000 mL | INTRAVENOUS | Status: DC | PRN

## 2016-12-04 MED ORDER — SODIUM CHLORIDE 0.9% FLUSH
3.0000 mL | Freq: Two times a day (BID) | INTRAVENOUS | Status: DC
  Administered 2016-12-04 – 2016-12-06 (×3): 3 mL via INTRAVENOUS

## 2016-12-04 MED ORDER — HEPARIN BOLUS VIA INFUSION
4000.0000 [IU] | Freq: Once | INTRAVENOUS | Status: AC
  Administered 2016-12-04: 4000 [IU] via INTRAVENOUS
  Filled 2016-12-04: qty 4000

## 2016-12-04 MED ORDER — ASPIRIN 81 MG PO CHEW
81.0000 mg | CHEWABLE_TABLET | Freq: Every day | ORAL | Status: DC
  Administered 2016-12-05 – 2016-12-07 (×3): 81 mg via ORAL
  Filled 2016-12-04 (×3): qty 1

## 2016-12-04 MED ORDER — MAGNESIUM SULFATE 2 GM/50ML IV SOLN
2.0000 g | Freq: Once | INTRAVENOUS | Status: AC
  Administered 2016-12-04: 2 g via INTRAVENOUS
  Filled 2016-12-04: qty 50

## 2016-12-04 MED ORDER — FUROSEMIDE 10 MG/ML IJ SOLN
80.0000 mg | Freq: Two times a day (BID) | INTRAMUSCULAR | Status: DC
  Administered 2016-12-04 – 2016-12-05 (×2): 80 mg via INTRAVENOUS
  Filled 2016-12-04 (×2): qty 8

## 2016-12-04 NOTE — ED Notes (Signed)
Pt extremely anxious  Spo2  94% on 15L NRB

## 2016-12-04 NOTE — Progress Notes (Addendum)
ANTICOAGULATION CONSULT NOTE  Pharmacy Consult:  Heparin Indication: pulmonary embolus  No Known Allergies  Patient Measurements: Height: 6\' 2"  (188 cm) Weight: 157 lb 6.5 oz (71.4 kg) IBW/kg (Calculated) : 82.2 Heparin Dosing Weight: 75 kg  Vital Signs: Temp: 98.1 F (36.7 C) (08/12 1615) Temp Source: Axillary (08/12 1615) BP: 141/95 (08/12 1615) Pulse Rate: 117 (08/12 1615)    Assessment: 61 YOM presented with chest pressure and found to have bilateral PEs on CT.  Pharmacy consulted to initiate IV heparin.    Initial heparin level = 0.3  Goal of Therapy:  Heparin level 0.3-0.7 units/ml Monitor platelets by anticoagulation protocol: Yes    Plan:  Increase heparin to 1300 units / hr to prevent drop to less 0.3 Follow up 6 hour heparin level  Thank you Okey Regal, PharmD [phone]  12/04/2016, 5:33 PM

## 2016-12-04 NOTE — Progress Notes (Addendum)
Paged by ED nurse that patient has complaints of anxiety attack. Patient resting comfortably in bed in no acute distress from doorway. He is here for heart failure with reduced EF (10-15%) exacerbation in the setting of ongoing cocaine abuse and pulmonary edema. He repeatedly complains of shortness of breath (maintaining O2 sats on 2L [place]) and anxiety. His shortness of breath is secondary to his heart failure exacerbation. This may be causing some anxiety for him however he is not having a panic attack currently. Providing anxiolytic with a benzo would not be indicated at this time and would only serve to decrease his respiratory drive and do nothing to correct this underlying etiology. Will continue to diurese and have consulted heart failure team.  Full H&P to follow.

## 2016-12-04 NOTE — Progress Notes (Signed)
ANTICOAGULATION CONSULT NOTE - Initial Consult  Pharmacy Consult:  Heparin Indication: pulmonary embolus  No Known Allergies  Patient Measurements: Height: 6' 2.5" (189.2 cm) Weight: 165 lb (74.8 kg) IBW/kg (Calculated) : 83.35 Heparin Dosing Weight: 75 kg  Vital Signs: Temp: 97.8 F (36.6 C) (08/12 0608) Temp Source: Oral (08/12 0608) BP: 162/109 (08/12 0845) Pulse Rate: 112 (08/12 0845)  Labs:  Recent Labs  12/04/16 0609  HGB 15.1  HCT 42.6  PLT 282  CREATININE 0.88    Estimated Creatinine Clearance: 92.1 mL/min (by C-G formula based on SCr of 0.88 mg/dL).   Medical History: Past Medical History:  Diagnosis Date  . CHF (congestive heart failure) (HCC)   . Coronary artery disease   . Depression   . Diabetes mellitus   . Hypertension   . Mental disorder   . MI (myocardial infarction) (HCC) 1984   age 27 per patient     Assessment: 72 YOM presented with chest pressure and found to have bilateral PEs on CT.  Pharmacy consulted to initiate IV heparin.  Baseline labs and home meds reviewed.   Goal of Therapy:  Heparin level 0.3-0.7 units/ml Monitor platelets by anticoagulation protocol: Yes    Plan:  Heparin 4000 units IV bolus x 1, then Heparin gtt at 1150 units/hr Check 6 hr heparin level Daily heparin level and CBC    Nora Rooke D. [name], PharmD, BCPS Pager:  [phone] 12/04/2016, 9:55 AM

## 2016-12-04 NOTE — ED Notes (Signed)
Patient transported to CT 

## 2016-12-04 NOTE — H&P (Signed)
Date: 12/04/2016               Patient Name:  [name] MRN: [id number]  DOB: 04-Aug-1955 Age / Sex: 61 y.o., male   PCP: [facility]              Medical Service: Internal Medicine Teaching Service              Attending Physician: Dr. Doneen Poisson, MD    First Contact: Will Jimmey Ralph, MS III Pager: [phone]  Second Contact: Dr. Rozann Lesches Pager: [phone]  Third Contact Dr. Valentino Nose Pager: [phone]       After Hours (After 5p/  First Contact Pager: [phone]  weekends / holidays): Second Contact Pager: [phone]   Chief Complaint: Chest pain  History of Present Illness: [name] is a 61 year old with a past medical history of HFrEF (last EF 10-15%), CAD (s/p angioplasty in 1984 and left and right cath with coronary angiogram in 2014), HTN, type II DM, as well as alcohol and cocaine abuse who presents with chest pain. The pain first began at 1am last night while he was replacing a car battery, which he described as hard work. He described the pain as "like someone sitting on [his] chest." The pain did not radiate anywhere on his body, and remained in the center of his chest. He also experienced diaphoresis at this time, but did not have any nausea/vomiting associated with the pain. He describes feeling short of breath for a few days prior to admission, and that his shortness of breath has gotten worse this morning. During the interview, he reported that he was currently having a panic attack, and that his breathing has gotten worse as a result. He described feeling like he could not catch his breath while on the nonrebreather mask set at 2 L/min. He reported that he drinks four 40 oz beers a day, and that he ingests cocaine by smoking it. He has used cocaine "off and on for 40 years." He reported that he last used both alcohol and cocaine yesterday. He denies any fevers or changes in bowel frequency or consistency. However, he reported a decrease in his urinary  output over the past day.  He has been followed by the CHF clinic for his HFrEF, and efforts have been made to help him stay adherent with his medication regimen, which includes losartan, Imdur, hydralazine, digoxin, atorvastatin and aspirin (81 mg). However, he stated that he has been homeless for the past two weeks and has had a hard time taking his medications as a result. He reported that he has been taking all of his medications as prescribed until about two days ago, when he did not have access to the car that he had kept the medications in. His last echocardiogram on 08/07/16 showed an EF of 10-15%. During his last admission around that time for DOE and orthopnea, he was given IV milrinone and then weaned off. He was discharged with Imdur, hydralazine, digoxin and losartan.   Meds: Current Facility-Administered Medications  Medication Dose Route Frequency Provider Last Rate Last Dose  . furosemide (LASIX) injection 40 mg  40 mg Intravenous Once Valentino Nose, MD      . heparin ADULT infusion 100 units/mL (25000 units/259mL sodium chloride 0.45%)  1,150 Units/hr Intravenous Continuous Dang, Thuy D, RPH 11.5 mL/hr at 12/04/16 1036 1,150 Units/hr at 12/04/16 1036  . nicotine (NICODERM CQ - dosed in mg/24 hr) patch 7 mg  7 mg Transdermal Daily  Valentino Nose, MD      . nitroGLYCERIN (NITROSTAT) SL tablet 0.4 mg  0.4 mg Sublingual Q5 min PRN Phillis Haggis, MD   0.4 mg at 12/04/16 0720   Current Outpatient Prescriptions  Medication Sig Dispense Refill  . acetaminophen (TYLENOL) 325 MG tablet Take 650 mg by mouth every 6 (six) hours as needed for mild pain.    Marland Kitchen ALPRAZolam (XANAX) 1 MG tablet Take 1 mg by mouth every 8 (eight) hours as needed for anxiety.    . ARIPiprazole (ABILIFY) 10 MG tablet Take 5 mg by mouth daily.    Marland Kitchen aspirin 81 MG chewable tablet Chew 1 tablet (81 mg total) by mouth daily. 90 tablet 3  . atorvastatin (LIPITOR) 20 MG tablet Take 1 tablet (20 mg total) by mouth daily at 6  PM. 90 tablet 3  . buPROPion (WELLBUTRIN) 100 MG tablet Take 100 mg by mouth daily.     . digoxin (LANOXIN) 0.125 MG tablet Take 1 tablet (0.125 mg total) by mouth daily. 30 tablet 0  . furosemide (LASIX) 40 MG tablet Take 40 mg by mouth 2 (two) times daily.    . hydrALAZINE (APRESOLINE) 25 MG tablet Take 1 tablet (25 mg total) by mouth 3 (three) times daily. 270 tablet 2  . insulin glargine (LANTUS) 100 UNIT/ML injection Inject 40 Units into the skin daily as needed. If CBG >300, will take 40 units  Patient checks his BG around 3 times a week.    . isosorbide mononitrate (IMDUR) 30 MG 24 hr tablet Take 1 tablet (30 mg total) by mouth daily. 30 tablet 0  . losartan (COZAAR) 50 MG tablet Take 50 mg by mouth daily.  0  . metFORMIN (GLUCOPHAGE) 1000 MG tablet Take 1 tablet (1,000 mg total) by mouth 2 (two) times daily with a meal. For high blood sugar control    . potassium chloride SA (K-DUR,KLOR-CON) 20 MEQ tablet Take 20 mEq by mouth daily.    . sacubitril-valsartan (ENTRESTO) 24-26 MG Take 1 tablet by mouth 2 (two) times daily. 60 tablet 5  . sertraline (ZOLOFT) 100 MG tablet Take 100 mg by mouth at bedtime.     . traZODone (DESYREL) 100 MG tablet Take 1 tablet (100 mg total) by mouth at bedtime. For sleep 30 tablet 0    Allergies: Allergies as of 12/04/2016  . (No Known Allergies)   Past Medical History:  Diagnosis Date  . CHF (congestive heart failure) (HCC)   . Coronary artery disease   . Depression   . Diabetes mellitus   . Hypertension   . Mental disorder   . MI (myocardial infarction) (HCC) 57   age 78 per patient   Past Surgical History:  Procedure Laterality Date  . ANGIOPLASTY  1984   at age 37  . LEFT AND RIGHT HEART CATHETERIZATION WITH CORONARY/GRAFT ANGIOGRAM  08/13/2012   Procedure: LEFT AND RIGHT HEART CATHETERIZATION WITH [name];  Surgeon: Kathleene Hazel, MD;  Location: [facility] CATH LAB;  Service: Cardiovascular;;   Family History  Problem  Relation Age of Onset  . Heart attack Father        >60s  . Hypertension Father   . Diabetes Father   . Heart attack Sister   . Hypertension Mother    Social History   Social History  . Marital status: Widowed    Spouse name: N/A  . Number of children: N/A  . Years of education: N/A   Occupational History  .  Not on file.   Social History Main Topics  . Smoking status: Current Every Day Smoker    Packs/day: 0.50    Years: 40.00    Types: Cigarettes  . Smokeless tobacco: Never Used  . Alcohol use Yes     Comment: 4 40 oz beers daily  . Drug use: Yes    Frequency: 7.0 times per week    Types: Marijuana, "Crack" cocaine     Comment: currently using  . Sexual activity: Yes   Other Topics Concern  . Not on file   Social History Narrative   Reports being homeless x 2 weeks.     Review of Systems: Pertinent items are noted in HPI.  Physical Exam: Blood pressure 131/86, pulse (!) 106, temperature 97.8 F (36.6 C), temperature source Oral, resp. rate (!) 28, height 6' 2.5" (1.892 m), weight 74.8 kg (165 lb), SpO2 97 %. General appearance: fatigued and moderate distress Head: Normocephalic, without obvious abnormality, atraumatic Eyes: positive findings: sclera icteric Lungs: rales right sided more than left Heart: tachycardic, no mumurs heard, regular S1 and S2 Abdomen: abnormal findings:  mild tenderness in the RLQ Extremities: edema in bilateral feet and 1+ posterior tibial pulses  Lab results: I-stat troponin (7846): 0.05 BMP: Na+ 136, K+ 4.2, Cl- 106, CO2 18, glucose 202, BUN 9, Cr 0.88, Ca2+ 8.3, anion gap 12. CBC: WBC 5.3, RBC 4.91, Hgb 15.1, HCT 42.6, MCV 86.8, platelets 282. D-dimer: 1.44 BNP: 1958.9 UDS: positive for cocaine Ethanol, blood: <5 LFTs: total protein 7.4, album 2.7, AST 61, ALT 29, ALP 101, tbili 1.1 Troponin-I (1120): 0.04 Magnesium: 1.4 Digoxin level: <0.2  Imaging results:  Dg Chest 2 View  Result Date: 12/04/2016 CLINICAL DATA:  61  y/o M; mid chest pain that started 2 hours prior to arrival. EXAM: CHEST  2 VIEW COMPARISON:  08/05/2016 and 04/12/2016 chest radiograph FINDINGS: Small right-greater-than-left pleural effusions and bibasilar opacities. Stable cardiac silhouette given projection and technique. Mild degenerative changes of the spine. No acute osseous abnormality is evident. IMPRESSION: Small bilateral pleural effusions and bibasilar opacities probably representing associated atelectasis, similar to prior radiographs given differences in technique. Electronically Signed   By: Mitzi Hansen M.D.   On: 12/04/2016 06:27   Ct Angio Chest Pe W Or Wo Contrast  Result Date: 12/04/2016 CLINICAL DATA:  Short of breath and chest pain since last night EXAM: CT ANGIOGRAPHY CHEST WITH CONTRAST TECHNIQUE: Multidetector CT imaging of the chest was performed using the standard protocol during bolus administration of intravenous contrast. Multiplanar CT image reconstructions and MIPs were obtained to evaluate the vascular anatomy. CONTRAST:  100 cc Isovue 370 COMPARISON:  09/09/2015 FINDINGS: Cardiovascular: There are small filling defects within the tiny subsegmental branches of the bilateral lower lobe pulmonary artery branches. See images 121 and 100 and 15. This could possibly represent mixing artifact but differentiation between true pulmonary thromboemboli and artifact is difficult. No lobar are central pulmonary emboli. Maximal diameter of the ascending aorta is 4.0 cm. There is no evidence of right heart strain by measurement criteria. Atherosclerotic calcifications of the aortic arch and 3 vessel coronary artery's. Systemic circulation is non-opacified. Mediastinum/Nodes: Mild mediastinal adenopathy. 11 mm precarinal node. Innumerable but smaller AP window nodes. 14 mm pretracheal node on image 43. Prominent peribronchovascular soft tissues in the hilar regions. Thyroid is unremarkable. No pericardial effusion. Lungs/Pleura:  Moderate bilateral pleural effusions are slightly larger. Scattered ground-glass throughout both lungs is present. Prominent peribronchovascular soft tissues extends around the airways of both  lower lobes. No pneumothorax. Upper Abdomen: 13 mm short axis diameter gastrohepatic ligament lymph node. Musculoskeletal: No new thoracic compression deformity. No sternal fracture. No evidence of rib fracture pre Review of the MIP images confirms the above findings. IMPRESSION: There are small bilateral lower lobe subsegmental pulmonary thromboemboli. Bilateral pleural effusions. Bilateral ground-glass opacities. Inflammatory changes of the airways in the hilar regions and bilateral lower lobes as described. Mild mediastinal and upper abdominal adenopathy. These findings may reflect volume overload but an inflammatory process or even malignancy cannot be entirely excluded. Correlate clinically. Three to six-month followup can be performed to ensure resolution of these findings. Aortic Atherosclerosis (ICD10-I70.0). Electronically Signed   By: Jolaine Click M.D.   On: 12/04/2016 09:39    Other results: EKG: Sinus tachycardia with occasional PVCs. 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 Possible atrial enlargement. Rightward axis.  Assessment & Plan by Problem: Active Problems:   HFrEF (heart failure with reduced ejection fraction) [facility])  [name] is a 61 year old with a past medical history of CHF, HTN, CAD, type II DM and alcohol and cocaine abuse who presents due to chest pain in the setting of volume overload.  Chest pain in the setting of volume overload: [name] was hospitalized due to progressive shortness of breath in April. He did not complain of chest pain at that time. While he still exhibits signs of shortness of breath and dyspnea on exertion, his current chest pain may be due to ACS. His EKG showed no signs of acute ischemia, but troponins will be measured to rule out NSTEMI. His first troponin-I  value (at 1120) was measured to be 0.04. His troponins will be trended to examine fore signs of ischemia. His CTA chest also showed signs of small pulmonary thromboemboli. Although his crushing chest pain is not typical for PE, this may explain both his chest pain and shortness of breath. His d-dimer was elevated to 1.44, which gives more evidence to this potential diagnosis. However, his chest pain may be due to exacerbation of his underlying CHF. His disease is advanced, and he may be experiencing tightness in his chest due to increased respiratory demand in the setting of volume overload. - Echocardiogram to examine for change in LV function - consult CHF, appreciate their recommendations - follow troponins - continue Imdur 30 mg daily  HFrEF exacerbation: [name] was admitted for a similar presentation of progressive shortness of breath in April. At that time, he had not been adherent to his medications. After that admission, he had been followed by the paramedicine team as a function of the CHF clinic, but this stopped due to his homelessness and lack of a phone for them to reach him with. He states that he has not been taking his medications over the last few days due to lack of access to them. CXR and CT chest show pulmonary edema consistent with volume overload. BNP 1958.9 on admission. He received IV 40 mg lasix x2 in the ED. - per cardiology:  - restart home heart failure meds (Imdur 30 mg qd, K-Dur 20 mEq qd, Entresto 24-26 mg, asa 81 mg qd, atorvastatin 20 mg qd.  - check co-oximetry - continue IV lasix 80 mg BID  Small bilateral lower lobe pulmonary emboli: CT angiogram showed signs of small bilateral lower lobe subsegmental pulmonary emboli. His complaint of chest pain, together with shortness of breath, bilateral lower extremity edema and a D-dimer of 1.44 indicates that pulmonary emboli may be at least partially explain his  current clinical presentation. He has been stable in the  low 90s% SpO2 on 2L  since admission. However, his shortness of breath, chest pain and oxygen requirement are most likely due to a combination of his pulmonary emboli and his CHF exacerbation. - continue heparin drip - pharmacy consulted, appreciate their recommendations  Substance abuse: UDS positive for cocaine. Blood alcohol <5 on admission. He admitted to using both yesterday.  - CIWA protocol to monitor for alcohol withdrawal - Nicotine patch 7 mg  Type II DM: He takes metformin 1000 mg BID at home. Glucose on admission 202. Most recent A1c was 9.6 on 08/05/16. - start Lantus 20 units while in hospital  Depression/anxiety: We will continue to monitor his mental status and mood, but we will hold his anxiolytics for now as they may suppress respiratory drive.  This is a Psychologist, occupational Note.  The care of the patient was discussed with Dr. Saunders Revel and the assessment and plan was formulated with their assistance.  Please see their note for official documentation of the patient encounter.   Signed: Shon Baton, Medical Student 12/04/2016, 11:09 AM

## 2016-12-04 NOTE — Consult Note (Signed)
Cardiology Consult Note  Admit date: 12/04/2016 Name: [name] 61 y.o.  male DOB:  [birth year] MRN:  [id number]  Today's date:  12/04/2016  Referring Physician:    Teaching service  Primary Physician:    [facility]  Reason for Consultation:    Chest pain and congestive heart failure  IMPRESSIONS: 1.  Acute on chronic systolic heart failure worsened due to medical noncompliance 2.  Chest pain likely due to cocaine abuse 3.  Medical noncompliance 4.  Hypertension 5.  Hepatitis C   RECOMMENDATION: Restart his heart failure medications.  Check coox.  Control blood pressure.  He is a poor candidate for intervention due to his noncompliance.  HISTORY: This 61 year old black male has a history of hypertension and hyperlipidemia diabetes and chronic systolic and diastolic heart failure depression and polysubstance abuse.  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  He reportedly had a prior history of angioplasty in 1984 and has had moderate coronary artery disease last at catheterization in 2014.  He has had multiple episodes of noncompliance.  His last hospitalization was in April and his ejection fraction was 10-15%.  He was found to be chronically hyponatremic and also had cardiogenic shock and required inotropes during that admission.  He was last seen on May 30 and the heart failure clinic and he also is followed at the [place].  He since then has been noncompliant and attempts to contact him have been difficult.  He presented to the hospital today complaining of chest pain and anxiety as well as increased shortness of breath and was placed on a breathing mask.  He admits to using crack cocaine 3 days ago and had a positive drug screen for cocaine.  His digoxin level was undetectable and he admits that he has not been taking his medicines as he should be and does not really have medicines available for [place] to review as to what he was taking accurately.   Cardiology was asked to consult on him he currently is not complaining of chest pain and as long as he is quiet and is not having any severe shortness of breath.  Ring his last admission he was seen by palliative medicine.  He is not felt to be a candidate for aggressive therapy or home inotropes.  He has been homeless the past few weeks and has been very difficult to manage due to his noncompliance.  Past Medical History:  Diagnosis Date  . CHF (congestive heart failure) (HCC)   . Coronary artery disease   . Depression   . Diabetes mellitus   . Hypertension   . Mental disorder   . MI (myocardial infarction) (HCC) 65   age 36 per patient      Past Surgical History:  Procedure Laterality Date  . ANGIOPLASTY  1984   at age 1  . LEFT AND RIGHT HEART CATHETERIZATION WITH CORONARY/GRAFT ANGIOGRAM  08/13/2012   Procedure: LEFT AND RIGHT HEART CATHETERIZATION WITH [name];  Surgeon: Kathleene Hazel, MD;  Location: [facility] CATH LAB;  Service: Cardiovascular;;     Allergies:  has No Known Allergies.   Medications: Prior to Admission medications   Medication Sig Start Date End Date Taking? Authorizing Provider  acetaminophen (TYLENOL) 325 MG tablet Take 650 mg by mouth every 6 (six) hours as needed for mild pain.   Yes [provider]  ALPRAZolam Prudy Feeler) 1 MG tablet Take 1 mg by mouth every 8 (eight) hours as needed for anxiety.  Yes [provider]  ARIPiprazole (ABILIFY) 10 MG tablet Take 5 mg by mouth daily.   Yes [provider]  aspirin 81 MG chewable tablet Chew 1 tablet (81 mg total) by mouth daily. 09/21/16  Yes Graciella Freer, PA-C  atorvastatin (LIPITOR) 20 MG tablet Take 1 tablet (20 mg total) by mouth daily at 6 PM. 09/21/16  Yes Tillery, Mariam Dollar, PA-C  buPROPion [facility]) 100 MG tablet Take 100 mg by mouth daily.    Yes [provider]  digoxin (LANOXIN) 0.125 MG tablet Take 1 tablet (0.125 mg total) by  mouth daily. 09/21/16  Yes Graciella Freer, PA-C  furosemide (LASIX) 40 MG tablet Take 40 mg by mouth 2 (two) times daily.   Yes [provider]  hydrALAZINE (APRESOLINE) 25 MG tablet Take 1 tablet (25 mg total) by mouth 3 (three) times daily. 09/21/16  Yes Graciella Freer, PA-C  insulin glargine (LANTUS) 100 UNIT/ML injection Inject 40 Units into the skin daily as needed. If CBG >300, will take 40 units  Patient checks his BG around 3 times a week.   Yes [provider]  isosorbide mononitrate (IMDUR) 30 MG 24 hr tablet Take 1 tablet (30 mg total) by mouth daily. 09/21/16  Yes Graciella Freer, PA-C  losartan (COZAAR) 50 MG tablet Take 50 mg by mouth daily. 09/21/16  Yes [provider]  metFORMIN (GLUCOPHAGE) 1000 MG tablet Take 1 tablet (1,000 mg total) by mouth 2 (two) times daily with a meal. For high blood sugar control 08/13/16  Yes Maxie Barb, MD  potassium chloride SA (K-DUR,KLOR-CON) 20 MEQ tablet Take 20 mEq by mouth daily.   Yes [provider]  sacubitril-valsartan (ENTRESTO) 24-26 MG Take 1 tablet by mouth 2 (two) times daily. 10/19/16  Yes Laurey Morale, MD  sertraline (ZOLOFT) 100 MG tablet Take 100 mg by mouth at bedtime.    Yes [provider]  traZODone (DESYREL) 100 MG tablet Take 1 tablet (100 mg total) by mouth at bedtime. For sleep 12/27/12  Yes Sanjuana Kava, NP   Family History: Family Status  Relation Status  . Father (Not Specified)  . Sister (Not Specified)  . Mother (Not Specified)    Social History:   reports that he has been smoking Cigarettes.  He has a 20.00 pack-year smoking history. He has never used smokeless tobacco. He reports that he drinks alcohol. He reports that he uses drugs, including Marijuana and "Crack" cocaine, about 7 times per week.   Social History   Social History Narrative   Reports being homeless x 2 weeks.     Review of Systems: He complains of shortness of  breath.  He complains of anxiety at the present time and has had some mild abdominal pain.  Admits to smoking crack cocaine.  Other than as noted above remainder of the review of systems is unremarkable  Physical Exam: BP (!) 199/152 (BP Location: Right Arm)   Pulse (!) 141   Temp 97.6 F (36.4 C) (Axillary)   Resp (!) 32   Ht 6\' 2"  (1.88 m)   Wt 71.4 kg (157 lb 6.5 oz)   SpO2 95%   BMI 20.21 kg/m    General appearance: He is a thin black male resting quietly in the bed wearing Ventimask in no acute distress Head: Normocephalic, without obvious abnormality, atraumatic Eyes: conjunctivae/corneas clear. PERRL, EOM's intact. Fundi not examined  Neck: no adenopathy, no carotid bruit, supple, symmetrical, trachea midline  and JVD elevated at 30 Lungs: Reduced breath sounds at the base Heart: PMI displaced, normal S1 and S2, S3 gallop present, 2/6 systolic murmur at apex Abdomen: soft, non-tender; bowel sounds normal; no masses,  no organomegaly Rectal: deferred Extremities: No deformity, 1+ edema noted normal range of motion Pulses: 2+ and symmetric Skin: Skin color, texture, turgor normal. No rashes or lesions Neurologic: Grossly normal Psych: Alert and oriented x 3 Labs: CBC  Recent Labs  12/04/16 0609  WBC 5.3  RBC 4.91  HGB 15.1  HCT 42.6  PLT 282  MCV 86.8  MCH 30.8  MCHC 35.4  RDW 13.2   CMP   Recent Labs  12/04/16 0609 12/04/16 1120  NA 136  --   K 4.2  --   CL 106  --   CO2 18*  --   GLUCOSE 202*  --   BUN 9  --   CREATININE 0.88  --   CALCIUM 8.3*  --   PROT  --  7.4  ALBUMIN  --  2.7*  AST  --  61*  ALT  --  29  ALKPHOS  --  101  BILITOT  --  1.1  GFRNONAA >60  --   GFRAA >60  --    BNP (last 3 results) BNP    Component Value Date/Time   BNP 1,958.9 (H) 12/04/2016 0609   Cardiac Panel (last 3 results) Troponin (Point of Care Test)  Recent Labs  12/04/16 0620  TROPIPOC 0.05   Cardiac Panel (last 3 results)  Recent Labs   12/04/16 1120  TROPONINI 0.04*     Radiology:  Right greater than left pleural effusions and cardiomegaly  EKG: Sinus tachycardia with PVCs, nonspecific ST changes Independently reviewed by me  Signed:  W. Ashley Royalty MD [facility]   Cardiology Consultant  12/04/2016, 3:09 PM

## 2016-12-04 NOTE — Care Management Note (Signed)
Case Management Note  Patient Details  Name: [name] MRN: [id number] Date of Birth: 1955/06/14  Subjective/Objective:   Pt has [facility] Medicare and does not meet criteria for  medication assistance                 Action/Plan: CM will sign off for now but will be available should additional discharge needs arise or disposition change.    Expected Discharge Date:                  Expected Discharge Plan:     In-House Referral:     Discharge planning Services  CM Consult  Post Acute Care Choice:    Choice offered to:     DME Arranged:    DME Agency:     HH Arranged:    HH Agency:     Status of Service:  Completed, signed off  If discussed at [employer] of Stay Meetings, dates discussed:    Additional Comments:  Yvone Neu, RN 12/04/2016, 11:38 AM

## 2016-12-04 NOTE — ED Triage Notes (Signed)
Reports mid chest pain that started 2 hours pta.  Reports as pressure.

## 2016-12-04 NOTE — ED Notes (Signed)
Attempted report x1. 

## 2016-12-04 NOTE — ED Notes (Addendum)
Pt repeatedly calling out with c/o of having extreme anxiety.  Dr. Karma Greaser aware and denies pt's need for anxiety relief.

## 2016-12-04 NOTE — ED Notes (Signed)
Verified heparin with Tobi Bastos, RN

## 2016-12-04 NOTE — ED Provider Notes (Signed)
[facility]-EMERGENCY DEPT Provider Note   CSN: [id number] Arrival date & time: 12/04/16  0603     History   Chief Complaint Chief Complaint  Patient presents with  . Chest Pain    HPI [name] is a 61 y.o. male.  HPI  Pt with hx of CHF, CAD, DM, HTN presenting with c/o shortness of breath and chest pain.  He states last night he developed 2 episodes of chest pain that feels like chest pressure- he states this woke him up out of bed.  He also states he has been more short of breath over the past week- especially with exertion.  Has had some increased swelling in his lower legs as well- states he has missed couple doses of lasix but has not run out.  There are no other associated systemic symptoms, there are no other alleviating or modifying factors.   Past Medical History:  Diagnosis Date  . CHF (congestive heart failure) (HCC)   . Coronary artery disease   . Depression   . Diabetes mellitus   . Hypertension   . Mental disorder   . MI (myocardial infarction) [facility]) 24   age 60 per patient    Patient Active Problem List   Diagnosis Date Noted  . Acute exacerbation of CHF (congestive heart failure) (HCC) 12/04/2016  . H/O noncompliance with medical treatment, presenting hazards to health 12/04/2016  . Chronic systolic CHF (congestive heart failure) (HCC) 12/04/2016  . Anxiety state   . Depression with anxiety 08/05/2016  . DNR (do not resuscitate) discussion   . Palliative care encounter   . Polysubstance abuse   . Type 2 diabetes mellitus with complication (HCC)   . Hypertensive heart disease with chronic systolic congestive heart failure (HCC)   . 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 ETOH abuse 12/21/2012  . Cocaine dependence (HCC) 12/21/2012  . Substance induced mood disorder (HCC) 12/21/2012  . Coronary artery disease 08/14/2012  . Cardiomyopathy, nonischemic (HCC) 08/14/2012    Past Surgical History:  Procedure Laterality Date  . ANGIOPLASTY  1984   at  age 4  . LEFT AND RIGHT HEART CATHETERIZATION WITH CORONARY/GRAFT ANGIOGRAM  08/13/2012   Procedure: LEFT AND RIGHT HEART CATHETERIZATION WITH [name];  Surgeon: Kathleene Hazel, MD;  Location: [facility] CATH LAB;  Service: Cardiovascular;;       Home Medications    Prior to Admission medications   Medication Sig Start Date End Date Taking? Authorizing Provider  acetaminophen (TYLENOL) 325 MG tablet Take 650 mg by mouth every 6 (six) hours as needed for mild pain.   Yes [provider]  ALPRAZolam Prudy Feeler) 1 MG tablet Take 1 mg by mouth every 8 (eight) hours as needed for anxiety.   Yes [provider]  ARIPiprazole (ABILIFY) 10 MG tablet Take 5 mg by mouth daily.   Yes [provider]  aspirin 81 MG chewable tablet Chew 1 tablet (81 mg total) by mouth daily. 09/21/16  Yes Graciella Freer, PA-C  atorvastatin (LIPITOR) 20 MG tablet Take 1 tablet (20 mg total) by mouth daily at 6 PM. 09/21/16  Yes Tillery, Mariam Dollar, PA-C  buPROPion [facility]) 100 MG tablet Take 100 mg by mouth daily.    Yes [provider]  digoxin (LANOXIN) 0.125 MG tablet Take 1 tablet (0.125 mg total) by mouth daily. 09/21/16  Yes Graciella Freer, PA-C  furosemide (LASIX) 40 MG tablet Take 40 mg by mouth 2 (two) times daily.   Yes [provider]  hydrALAZINE (APRESOLINE) 25 MG tablet Take 1 tablet (25 mg total) by mouth 3 (three) times daily. 09/21/16  Yes Graciella Freer, PA-C  insulin glargine (LANTUS) 100 UNIT/ML injection Inject 40 Units into the skin daily as needed. If CBG >300, will take 40 units  Patient checks his BG around 3 times a week.   Yes [provider]  isosorbide mononitrate (IMDUR) 30 MG 24 hr tablet Take 1 tablet (30 mg total) by mouth daily. 09/21/16  Yes Graciella Freer, PA-C  losartan (COZAAR) 50 MG tablet Take 50 mg by mouth daily. 09/21/16  Yes [provider]  metFORMIN (GLUCOPHAGE)  1000 MG tablet Take 1 tablet (1,000 mg total) by mouth 2 (two) times daily with a meal. For high blood sugar control 08/13/16  Yes Maxie Barb, MD  potassium chloride SA (K-DUR,KLOR-CON) 20 MEQ tablet Take 20 mEq by mouth daily.   Yes [provider]  sacubitril-valsartan (ENTRESTO) 24-26 MG Take 1 tablet by mouth 2 (two) times daily. 10/19/16  Yes Laurey Morale, MD  sertraline (ZOLOFT) 100 MG tablet Take 100 mg by mouth at bedtime.    Yes [provider]  traZODone (DESYREL) 100 MG tablet Take 1 tablet (100 mg total) by mouth at bedtime. For sleep 12/27/12  Yes Sanjuana Kava, NP    Family History Family History  Problem Relation Age of Onset  . Heart attack Father        >60s  . Hypertension Father   . Diabetes Father   . Heart attack Sister   . Hypertension Mother     Social History Social History  Substance Use Topics  . Smoking status: Current Every Day Smoker    Packs/day: 0.50    Years: 40.00    Types: Cigarettes  . Smokeless tobacco: Never Used  . Alcohol use Yes     Comment: 4 40 oz beers daily     Allergies   Patient has no known allergies.   Review of Systems Review of Systems  ROS reviewed and all otherwise negative except for mentioned in HPI   Physical Exam Updated Vital Signs BP (!) 199/152 (BP Location: Right Arm)   Pulse (!) 141   Temp 97.6 F (36.4 C) (Axillary)   Resp (!) 32   Ht 6\' 2"  (1.88 m)   Wt 71.4 kg (157 lb 6.5 oz)   SpO2 95%   BMI 20.21 kg/m  Vitals reivewed Physical Exam  Physical Examination: General appearance - alert, well appearing, and in no distress Mental status - alert, oriented to person, place, and time Eyes - no conjunctival injection, no scleral icterus Mouth - mucous membranes moist, pharynx normal without lesions Neck - supple, no significant adenopathy Chest - clear to auscultation, no wheezes, rales or rhonchi, symmetric air entry Heart - normal rate, regular rhythm, normal S1, S2, no  murmurs, rubs, clicks or gallops Abdomen - soft, nontender, nondistended, no masses or organomegaly Neurological - alert, oriented, normal speech,, moving all extremities, sensation intact Extremities - peripheral pulses normal, 2 pitting pedal edema, no clubbing or cyanosis Skin - normal coloration and turgor, no rashes, Psych- anxious about symptoms but pleasant   ED Treatments / Results  Labs (all labs ordered are listed, but only abnormal results are displayed) Labs Reviewed  BASIC METABOLIC PANEL - Abnormal; Notable for the following:       Result Value   CO2 18 (*)    Glucose, Bld 202 (*)  Calcium 8.3 (*)    All other components within normal limits  BRAIN NATRIURETIC PEPTIDE - Abnormal; Notable for the following:    B Natriuretic Peptide 1,958.9 (*)    All other components within normal limits  D-DIMER, QUANTITATIVE (NOT AT [facility]) - Abnormal; Notable for the following:    D-Dimer, Quant 1.44 (*)    All other components within normal limits  RAPID URINE DRUG SCREEN, HOSP PERFORMED - Abnormal; Notable for the following:    Cocaine POSITIVE (*)    All other components within normal limits  DIGOXIN LEVEL - Abnormal; Notable for the following:    Digoxin Level <0.2 (*)    All other components within normal limits  TROPONIN I - Abnormal; Notable for the following:    Troponin I 0.04 (*)    All other components within normal limits  HEPATIC FUNCTION PANEL - Abnormal; Notable for the following:    Albumin 2.7 (*)    AST 61 (*)    All other components within normal limits  MAGNESIUM - Abnormal; Notable for the following:    Magnesium 1.4 (*)    All other components within normal limits  CBC  ETHANOL  HEPARIN LEVEL (UNFRACTIONATED)  TROPONIN I  TROPONIN I  I-STAT TROPONIN, ED    EKG  EKG Interpretation  Date/Time:  Sunday December 04 2016 06:10:07 EDT Ventricular Rate:  107 PR Interval:  152 QRS Duration: 94 QT Interval:  350 QTC Calculation: 467 R  Axis:   90 Text Interpretation:  Sinus tachycardia with occasional Premature ventricular complexes Possible Left atrial enlargement Rightward axis T wave abnormality, consider lateral ischemia Abnormal ECG Delta wave present - possible WPW When compared with ECG of 08/05/2016, Premature ventricular complexes are no longer present Confirmed by [name] ([id number]) on 12/04/2016 6:15:33 AM       Radiology Dg Chest 2 View  Result Date: 12/04/2016 CLINICAL DATA:  61 y/o M; mid chest pain that started 2 hours prior to arrival. EXAM: CHEST  2 VIEW COMPARISON:  08/05/2016 and 04/12/2016 chest radiograph FINDINGS: Small right-greater-than-left pleural effusions and bibasilar opacities. Stable cardiac silhouette given projection and technique. Mild degenerative changes of the spine. No acute osseous abnormality is evident. IMPRESSION: Small bilateral pleural effusions and bibasilar opacities probably representing associated atelectasis, similar to prior radiographs given differences in technique. Electronically Signed   By: Mitzi Hansen M.D.   On: 12/04/2016 06:27   Ct Angio Chest Pe W Or Wo Contrast  Result Date: 12/04/2016 CLINICAL DATA:  Short of breath and chest pain since last night EXAM: CT ANGIOGRAPHY CHEST WITH CONTRAST TECHNIQUE: Multidetector CT imaging of the chest was performed using the standard protocol during bolus administration of intravenous contrast. Multiplanar CT image reconstructions and MIPs were obtained to evaluate the vascular anatomy. CONTRAST:  100 cc Isovue 370 COMPARISON:  09/09/2015 FINDINGS: Cardiovascular: There are small filling defects within the tiny subsegmental branches of the bilateral lower lobe pulmonary artery branches. See images 121 and 100 and 15. This could possibly represent mixing artifact but differentiation between true pulmonary thromboemboli and artifact is difficult. No lobar are central pulmonary emboli. Maximal diameter of the ascending aorta is  4.0 cm. There is no evidence of right heart strain by measurement criteria. Atherosclerotic calcifications of the aortic arch and 3 vessel coronary artery's. Systemic circulation is non-opacified. Mediastinum/Nodes: Mild mediastinal adenopathy. 11 mm precarinal node. Innumerable but smaller AP window nodes. 14 mm pretracheal node on image 43. Prominent peribronchovascular soft tissues in the hilar regions.  Thyroid is unremarkable. No pericardial effusion. Lungs/Pleura: Moderate bilateral pleural effusions are slightly larger. Scattered ground-glass throughout both lungs is present. Prominent peribronchovascular soft tissues extends around the airways of both lower lobes. No pneumothorax. Upper Abdomen: 13 mm short axis diameter gastrohepatic ligament lymph node. Musculoskeletal: No new thoracic compression deformity. No sternal fracture. No evidence of rib fracture pre Review of the MIP images confirms the above findings. IMPRESSION: There are small bilateral lower lobe subsegmental pulmonary thromboemboli. Bilateral pleural effusions. Bilateral ground-glass opacities. Inflammatory changes of the airways in the hilar regions and bilateral lower lobes as described. Mild mediastinal and upper abdominal adenopathy. These findings may reflect volume overload but an inflammatory process or even malignancy cannot be entirely excluded. Correlate clinically. Three to six-month followup can be performed to ensure resolution of these findings. Aortic Atherosclerosis (ICD10-I70.0). Electronically Signed   By: Jolaine Click M.D.   On: 12/04/2016 09:39    Procedures Procedures (including critical care time)  Medications Ordered in ED Medications  nitroGLYCERIN (NITROSTAT) SL tablet 0.4 mg (0.4 mg Sublingual Given 12/04/16 0720)  heparin ADULT infusion 100 units/mL (25000 units/260mL sodium chloride 0.45%) (1,150 Units/hr Intravenous Rate/Dose Verify 12/04/16 1415)  nicotine (NICODERM CQ - dosed in mg/24 hr) patch 7 mg  (not administered)  insulin glargine (LANTUS) injection 20 Units (not administered)  potassium chloride SA (K-DUR,KLOR-CON) CR tablet 20 mEq (20 mEq Oral Given 12/04/16 1532)  sacubitril-valsartan (ENTRESTO) 24-26 mg per tablet (not administered)  aspirin chewable tablet 81 mg (not administered)  atorvastatin (LIPITOR) tablet 20 mg (not administered)  isosorbide mononitrate (IMDUR) 24 hr tablet 30 mg (30 mg Oral Given 12/04/16 1532)  sodium chloride flush (NS) 0.9 % injection 3 mL (not administered)  sodium chloride flush (NS) 0.9 % injection 3 mL (not administered)  0.9 %  sodium chloride infusion (not administered)  acetaminophen (TYLENOL) tablet 650 mg (not administered)  ondansetron (ZOFRAN) injection 4 mg (not administered)  furosemide (LASIX) injection 80 mg (not administered)  magnesium sulfate IVPB 2 g 50 mL (not administered)  digoxin (LANOXIN) tablet 0.125 mg (0.125 mg Oral Given 12/04/16 1532)  hydrALAZINE (APRESOLINE) tablet 25 mg (not administered)  aspirin chewable tablet 324 mg (324 mg Oral Given 12/04/16 0720)  furosemide (LASIX) injection 40 mg (40 mg Intravenous Given 12/04/16 0930)  iopamidol (ISOVUE-370) 76 % injection (100 mLs  Contrast Given 12/04/16 0910)  LORazepam (ATIVAN) injection 1 mg (1 mg Intravenous Given 12/04/16 0944)  heparin bolus via infusion 4,000 Units (4,000 Units Intravenous Bolus from Bag 12/04/16 1037)  furosemide (LASIX) injection 40 mg (40 mg Intravenous Given 12/04/16 1533)     Initial Impression / Assessment and Plan / ED Course  I have reviewed the triage vital signs and the nursing notes.  Pertinent labs & imaging results that were available during my care of the patient were reviewed by me and considered in my medical decision making (see chart for details).    10:20 AM pt has received lasix for fluid overload- heart failure exacerbation, aspiring and nitroglycerin for chest pain.  Also significant anxiety component- given ativan.  He is currently  on facemask O2 satting 95%, also started on heparin for finding of bilateral PE.  D/w medicine teaching service for unassigned admission to Dr. Josem Kaufmann- resident will place admission orders.     Final Clinical Impressions(s) / ED Diagnoses   Final diagnoses:  Acute on chronic congestive heart failure, unspecified heart failure type (HCC)  Other acute pulmonary embolism without acute cor pulmonale (HCC)  Chest  pain, unspecified type    New Prescriptions Current Discharge Medication List       Phillis Haggis, MD 12/04/16 1538

## 2016-12-04 NOTE — ED Notes (Signed)
Pt ambulated to restroom. Steady gait and tolerated well. 

## 2016-12-04 NOTE — H&P (Signed)
Date: 12/04/2016               Patient Name:  [name] MRN: [id number]  DOB: [birth year] Age / Sex: 60 y.o., male   PCP: [facility]         Medical Service: Internal Medicine Teaching Service         Attending Physician: Dr. Doneen Poisson, MD    First Contact: Boris Sharper, MS3 Pager: [phone]  Second Contact: Third Contact: Dr. Rozann Lesches Dr. Valentino Nose Pager: Pager: [phone]       After Hours (After 5p/  First Contact Pager: [phone]  weekends / holidays): Second Contact Pager: [phone]   Chief Complaint: Chest Pain  History of Present Illness: Mr [name] is a 61 yo with a PMH of combined systolic/diastolic CHF (Echo 07/2016 with EF = 10-15%), nonobstructive CAD, HTN, insulin dependent DM, with alcohol and cocaine abuse who presented with a 1 day history of chest pain. The HPI is limited 2/2 decreased patient participation, as the patient continues to fall asleep during the interview stating he is really tired. He was also given Ativan for anxiety in ED.  He states the chest pain started around 1 AM while taking out a car battery. He described this pain as an increased pressure and stated it felt like someone was "standing on my chest". No radiation of this pain to arm/jaw. He endorsed diaphoresis but denied nausea/vomiting. He also endorsed SOB stating that he has been having trouble breathing for a few days leading up to admission. He states that he has trouble breathing at baseline but thinks it may have gotten worse recently.    Per chart review the patient had an appointment on 11/22/16 with the CHF clinic that he missed and has been out of touch with his Paramedicine team since early July. He states that he has been homeless for the past two weeks. When asked how he has been taking his medications, the patient states that he gets them from the [place] and has been taking most of them recently. He knows that he ran out of some of his  medications a few days ago, but doesn't know which ones that ran out.   Meds:  Current Meds  Medication Sig  . acetaminophen (TYLENOL) 325 MG tablet Take 650 mg by mouth every 6 (six) hours as needed for mild pain.  Marland Kitchen ALPRAZolam (XANAX) 1 MG tablet Take 1 mg by mouth every 8 (eight) hours as needed for anxiety.  . ARIPiprazole (ABILIFY) 10 MG tablet Take 5 mg by mouth daily.  Marland Kitchen aspirin 81 MG chewable tablet Chew 1 tablet (81 mg total) by mouth daily.  Marland Kitchen atorvastatin (LIPITOR) 20 MG tablet Take 1 tablet (20 mg total) by mouth daily at 6 PM.  . buPROPion (WELLBUTRIN) 100 MG tablet Take 100 mg by mouth daily.   . digoxin (LANOXIN) 0.125 MG tablet Take 1 tablet (0.125 mg total) by mouth daily.  . furosemide (LASIX) 40 MG tablet Take 40 mg by mouth 2 (two) times daily.  . hydrALAZINE (APRESOLINE) 25 MG tablet Take 1 tablet (25 mg total) by mouth 3 (three) times daily.  . insulin glargine (LANTUS) 100 UNIT/ML injection Inject 40 Units into the skin daily as needed. If CBG >300, will take 40 units  Patient checks his BG around 3 times a week.  . isosorbide mononitrate (IMDUR) 30 MG 24 hr tablet Take 1 tablet (30 mg total) by mouth daily.  Marland Kitchen  losartan (COZAAR) 50 MG tablet Take 50 mg by mouth daily.  . metFORMIN (GLUCOPHAGE) 1000 MG tablet Take 1 tablet (1,000 mg total) by mouth 2 (two) times daily with a meal. For high blood sugar control  . potassium chloride SA (K-DUR,KLOR-CON) 20 MEQ tablet Take 20 mEq by mouth daily.  . sacubitril-valsartan (ENTRESTO) 24-26 MG Take 1 tablet by mouth 2 (two) times daily.  . sertraline (ZOLOFT) 100 MG tablet Take 100 mg by mouth at bedtime.   . traZODone (DESYREL) 100 MG tablet Take 1 tablet (100 mg total) by mouth at bedtime. For sleep   Allergies: Allergies as of 12/04/2016  . (No Known Allergies)   Past Medical History:  Diagnosis Date  . CHF (congestive heart failure) (HCC)   . Coronary artery disease   . Depression   . Diabetes mellitus   .  Hypertension   . Mental disorder   . MI (myocardial infarction) (HCC) 63   age 12 per patient   Past Surgical History: None  Family History:  CAD in father Diabetes in sister  Social History:  Patient reports he has been homeless for 2 weeks. Reports up to four 40oz bottles of beer daily, with last drink yesterday. Patient endorses cocaine use, with last used yesterday.   Review of Systems: A complete ROS was negative except as per HPI.   Physical Exam: Blood pressure 131/86, pulse (!) 106, temperature 97.8 F (36.6 C), temperature source Oral, resp. rate (!) 28, height 6' 2.5" (1.892 m), weight 165 lb (74.8 kg), SpO2 97 %.  Physical Exam  Constitutional:  Thin appearing many sitting in bed with nonrebreather mask in place. Breathing is fast but in no acute distress. Patient fell asleep multiple times during interview but was easily arousable.  Eyes: Scleral icterus is present.  Cardiovascular:  Tachycardic. No murmurs appreciated. Bilateral upper and lower distal extremities warm to the touch. 2+ radial pulses. Dorsalis pedis and posterior tibial pulses difficult to palpate, with doppler ultrasound confirming their presence.  Pulmonary/Chest:  Non-rebreather mask in place. No nasal flaring. Some accessory muscle use. Increased respiratory rate. Transmitted upper airway noise in upper lung fields. Crackles appreciated on lung bases (R>L).  Abdominal: Soft. He exhibits no distension. There is tenderness (RUQ with light and deep palpation).  Musculoskeletal: He exhibits no edema (of bilateral lower extremities) or tenderness (of bilateral lower extremities).  Skin: Skin is warm and dry. Capillary refill takes 2 to 3 seconds. No rash noted. No erythema. No pallor.   EKG: personally reviewed my interpretation is sinus rhythm. Normal PR and QRS intervals, with a possible new delta wave in Lead II. No signs of ST elevation.   CXR: personally reviewed my interpretation is bilateral  pleural effusions, with right sided pleural effusion possible extending into right minor fissure. No evidence of cardiomegaly.  BNP    Component Value Date/Time   BNP 1,958.9 (H) 12/04/2016 0609   BMP    Component Value Date/Time   NA 136 12/04/2016 0609   NA 131 (A) 08/18/2016   K 4.2 12/04/2016 0609   CL 106 12/04/2016 0609   CO2 18 (L) 12/04/2016 0609   GLUCOSE 202 (H) 12/04/2016 0609   BUN 9 12/04/2016 0609   BUN 15 08/18/2016   CREATININE 0.88 12/04/2016 0609   CALCIUM 8.3 (L) 12/04/2016 0609   GFRNONAA >60 12/04/2016 0609   GFRAA >60 12/04/2016 0609   Assessment & Plan by Problem: Active Problems:   HFrEF (heart failure with reduced ejection fraction) (  HCC)  Chest pain in setting of HFrEF: The patient was hospitalized with a similar presentation of progressive shortness of breath on 08/05/2016. Per chart review the patient did not endorse chest pain or diaphoresis at that time. These new symptoms are concerning for ACS. There was no radiation of the chest pain, nausea/vomiting. The patient endorses using cocaine as recently as yesterday, which is a concerning risk factor for ACS. The patient did not have ST elevation on EKG, but will trend troponin to rule out NSTEMI. I think his cardiac pain may be a result of his heart failure exacerbation, acute pulmonary embolisms, or a combination of these two things. We will obtain echo to ensure no new hypokinesis/change in LV function and consult cardiology for recommendations. -CHF consulted, appreciate recommendations -Continue cardiac monitoring -Troponin = 0.04 at 11:20 am -Troponin x2 q6 hours -Echocardiogram to assess EF and LV function -Continue IMDUR 30 mg daily  Acute exacerbation of chronic heart failure with reduced EF (4/18 echo EF = 10-15%): The patient was hospitalized with a similar presentation of progressive shortness of breath on 08/05/2016. At that time the patient had not taken his medications. After this admission  the patient was being followed by paramedicine team, but recently fell out of contact 2/2 homelessness. The patient states that he has intermittently been taking his medications since his change in social situation. The patient's CXR and CT chest show bilateral pleural effusions that are consistent with volume overload. Patient's BNP is elevated 1,958. These findings are consistent with acute exacerbation of CHF. The patient is s/p IV Lasix 40 mg x 2 in ED. -CHF consulted, appreciate recommendations -Continue IV lasix 80 mg BID (home lasix 40 mg BID) -Restart home digoxin 0.125mg  qd, as digoxin <0.2 on admission -Start home IMDUR 30 mg daily, K-Dur 20 mEq daily, Entresto 24-26 mg BID, aspirin 81 mg daily, atorvastatin 20 mg daily  Small bilateral lower lobe subsegmental pulmonary emboli: The patient's initial complaint was of chest pain. He had worsening SOB and a new oxygen requirement in ED (saturation of >90% on 2L [place] when primary team interviewed him). The patient's tachycardia gave him a modified geneva score of 5, moderate risk. His D-dimer was  had a CT angio to rule out PE. His SOB and increased oxygen requirement is likely multifactorial, with small bilateral PE's and acute on chronic heart failure with volume overload contributing to this problem.  -Continue heparin drip -Pharmacy consulted, appreciate recommendations  Hx of substance abuse, including alcohol and cocaine: Alcohol on admission <5. UDS + for cocaine.  -CIWA protocol for monitoring of alcohol withdraw symptoms -Nicotine Patch 7 mg TD  Hx of insulin dependent diabetes: Patient on metformin 1000 mg BID at home. Glucose on admission 202. Previously took 40 units Lantus at home, will provide 20 units in hospital and monitor glucose throughout admission.  Hx of depression/anxiety: Will continue to monitor during hospitalization, but holding anxiolytics for now as the patient had limited participation with interview after Ativan in  ED. Given the patient's new oxygen requirement and increased respirations on exam, would like to hold any medications that could suppress respiratory drive.   FEN/GI: -Mg on admission, 1.4. Will replete today -Heart healthy diet  Dispo: Admit patient to Inpatient with expected length of stay greater than 2 midnights.  SignedRozann Lesches, MD 12/04/2016, 11:11 AM  Pager: [phone]

## 2016-12-05 DIAGNOSIS — I2699 Other pulmonary embolism without acute cor pulmonale: Secondary | ICD-10-CM

## 2016-12-05 DIAGNOSIS — F1010 Alcohol abuse, uncomplicated: Secondary | ICD-10-CM

## 2016-12-05 DIAGNOSIS — E119 Type 2 diabetes mellitus without complications: Secondary | ICD-10-CM

## 2016-12-05 DIAGNOSIS — F1410 Cocaine abuse, uncomplicated: Secondary | ICD-10-CM

## 2016-12-05 DIAGNOSIS — I259 Chronic ischemic heart disease, unspecified: Secondary | ICD-10-CM

## 2016-12-05 DIAGNOSIS — I5043 Acute on chronic combined systolic (congestive) and diastolic (congestive) heart failure: Secondary | ICD-10-CM

## 2016-12-05 DIAGNOSIS — Z794 Long term (current) use of insulin: Secondary | ICD-10-CM

## 2016-12-05 DIAGNOSIS — Z79899 Other long term (current) drug therapy: Secondary | ICD-10-CM

## 2016-12-05 DIAGNOSIS — F419 Anxiety disorder, unspecified: Secondary | ICD-10-CM

## 2016-12-05 DIAGNOSIS — I5023 Acute on chronic systolic (congestive) heart failure: Secondary | ICD-10-CM

## 2016-12-05 DIAGNOSIS — F329 Major depressive disorder, single episode, unspecified: Secondary | ICD-10-CM

## 2016-12-05 LAB — COMPREHENSIVE METABOLIC PANEL
ALT: 23 U/L (ref 17–63)
AST: 33 U/L (ref 15–41)
Albumin: 2.3 g/dL — ABNORMAL LOW (ref 3.5–5.0)
Alkaline Phosphatase: 87 U/L (ref 38–126)
Anion gap: 12 (ref 5–15)
BUN: 13 mg/dL (ref 6–20)
CALCIUM: 7.9 mg/dL — AB (ref 8.9–10.3)
CO2: 19 mmol/L — ABNORMAL LOW (ref 22–32)
CREATININE: 1.07 mg/dL (ref 0.61–1.24)
Chloride: 101 mmol/L (ref 101–111)
GFR calc Af Amer: >60 mL/min (ref >60)
GLUCOSE: 148 mg/dL — AB (ref 65–99)
Potassium: 3.9 mmol/L (ref 3.5–5.1)
SODIUM: 132 mmol/L — AB (ref 135–145)
TOTAL PROTEIN: 6.4 g/dL — AB (ref 6.5–8.1)
Total Bilirubin: 1.5 mg/dL — ABNORMAL HIGH (ref 0.3–1.2)

## 2016-12-05 LAB — CBC
HCT: 42.4 % (ref 39.0–52.0)
Hemoglobin: 14.9 g/dL (ref 13.0–17.0)
MCH: 30 pg (ref 26.0–34.0)
MCHC: 35.1 g/dL (ref 30.0–36.0)
MCV: 85.5 fL (ref 78.0–100.0)
PLATELETS: 260 10*3/uL (ref 150–400)
RBC: 4.96 MIL/uL (ref 4.22–5.81)
RDW: 13 % (ref 11.5–15.5)
WBC: 9.6 10*3/uL (ref 4.0–10.5)

## 2016-12-05 LAB — GLUCOSE, CAPILLARY
GLUCOSE-CAPILLARY: 149 mg/dL — AB (ref 65–99)
GLUCOSE-CAPILLARY: 149 mg/dL — AB (ref 65–99)
Glucose-Capillary: 80 mg/dL (ref 65–99)
Glucose-Capillary: 191 mg/dL — ABNORMAL HIGH (ref 65–99)

## 2016-12-05 LAB — HEPARIN LEVEL (UNFRACTIONATED)
Heparin Unfractionated: 0.22 IU/mL — ABNORMAL LOW (ref 0.30–0.70)
Heparin Unfractionated: 0.31 IU/mL (ref 0.30–0.70)
Heparin Unfractionated: 0.48 IU/mL (ref 0.30–0.70)

## 2016-12-05 MED ORDER — RIVAROXABAN 20 MG PO TABS: 20.0000 mg | ORAL_TABLET | Freq: Every day | ORAL | Status: DC

## 2016-12-05 MED ORDER — WARFARIN SODIUM 7.5 MG PO TABS: 7.5000 mg | ORAL_TABLET | Freq: Once | ORAL | Status: DC

## 2016-12-05 MED ORDER — ADULT MULTIVITAMIN W/MINERALS CH
1.0000 | ORAL_TABLET | Freq: Every day | ORAL | Status: DC
  Administered 2016-12-05 – 2016-12-07 (×3): 1 via ORAL
  Filled 2016-12-05 (×3): qty 1

## 2016-12-05 MED ORDER — FOLIC ACID 1 MG PO TABS
1.0000 mg | ORAL_TABLET | Freq: Every day | ORAL | Status: DC
  Administered 2016-12-05 – 2016-12-07 (×3): 1 mg via ORAL
  Filled 2016-12-05 (×3): qty 1

## 2016-12-05 MED ORDER — RIVAROXABAN 15 MG PO TABS
15.0000 mg | ORAL_TABLET | Freq: Two times a day (BID) | ORAL | Status: DC
  Administered 2016-12-06 – 2016-12-07 (×3): 15 mg via ORAL
  Filled 2016-12-05 (×3): qty 1

## 2016-12-05 MED ORDER — VITAMIN B-1 100 MG PO TABS
100.0000 mg | ORAL_TABLET | Freq: Every day | ORAL | Status: DC
  Administered 2016-12-05 – 2016-12-07 (×3): 100 mg via ORAL
  Filled 2016-12-05 (×4): qty 1

## 2016-12-05 MED ORDER — LORAZEPAM 1 MG PO TABS: 1.0000 mg | ORAL_TABLET | Freq: Four times a day (QID) | ORAL | Status: DC | PRN

## 2016-12-05 MED ORDER — FUROSEMIDE 10 MG/ML IJ SOLN
40.0000 mg | Freq: Two times a day (BID) | INTRAMUSCULAR | Status: DC
  Administered 2016-12-05: 40 mg via INTRAVENOUS
  Filled 2016-12-05: qty 4

## 2016-12-05 MED ORDER — THIAMINE HCL 100 MG/ML IJ SOLN: 100.0000 mg | Freq: Every day | INTRAMUSCULAR | Status: DC

## 2016-12-05 MED ORDER — RIVAROXABAN (XARELTO) EDUCATION KIT FOR DVT/PE PATIENTS
PACK | Freq: Once | Status: AC
  Administered 2016-12-05: 13:00:00
  Filled 2016-12-05: qty 1

## 2016-12-05 MED ORDER — WARFARIN VIDEO: Freq: Once | Status: DC

## 2016-12-05 MED ORDER — PATIENT'S GUIDE TO USING COUMADIN BOOK
Freq: Once | Status: DC
  Filled 2016-12-05: qty 1

## 2016-12-05 MED ORDER — WARFARIN - PHARMACIST DOSING INPATIENT: Freq: Every day | Status: DC

## 2016-12-05 MED ORDER — LORAZEPAM 2 MG/ML IJ SOLN: 1.0000 mg | Freq: Four times a day (QID) | INTRAMUSCULAR | Status: DC | PRN

## 2016-12-05 MED ORDER — RIVAROXABAN 15 MG PO TABS
15.0000 mg | ORAL_TABLET | Freq: Two times a day (BID) | ORAL | Status: AC
  Administered 2016-12-05 (×2): 15 mg via ORAL
  Filled 2016-12-05 (×3): qty 1

## 2016-12-05 NOTE — Progress Notes (Signed)
Advanced Heart Failure Rounding Note  [facility]  Primary Cardiologist: Dr. Shirlee Latch   Subjective:    Admitted 12/04/16 with SOB, volume overload. Had not taken his medications for 3 days, + cocaine.   Feels better today, breathing improved.   Objective:   Weight Range: 160 lb 15 oz (73 kg) Body mass index is 20.66 kg/m.   Vital Signs:   Temp:  [97.4 F (36.3 C)-98.1 F (36.7 C)] 97.4 F (36.3 C) (08/13 0328) Pulse Rate:  [100-141] 100 (08/13 0328) Resp:  [21-35] 21 (08/13 0328) BP: (110-199)/(72-152) 127/82 (08/13 0328) SpO2:  [94 %-100 %] 97 % (08/13 0328) Weight:  [157 lb 6.5 oz (71.4 kg)-160 lb 15 oz (73 kg)] 160 lb 15 oz (73 kg) (08/13 0328) Last BM Date: 12/04/16  Weight change: Filed Weights   12/04/16 0609 12/04/16 1416 12/05/16 0328  Weight: 165 lb (74.8 kg) 157 lb 6.5 oz (71.4 kg) 160 lb 15 oz (73 kg)    Intake/Output:   Intake/Output Summary (Last 24 hours) at 12/05/16 0806 Last data filed at 12/04/16 2028  Gross per 24 hour  Intake           354.98 ml  Output             1340 ml  Net          -985.02 ml      Physical Exam    General:  Well appearing. No resp difficulty HEENT: Normal Neck: Supple. JVP 8-9 iwht HJR. Carotids 2+ bilat; no bruits. No lymphadenopathy or thyromegaly appreciated. Cor: PMI nondisplaced. Regular rate & rhythm. 2/6 SEM at apex.  Lungs: Clear Abdomen: Soft, nontender, nondistended. No hepatosplenomegaly. No bruits or masses. Good bowel sounds. Extremities: No cyanosis, clubbing, rash. 1+ pedal edema bilaterally.  Neuro: Alert & orientedx3, cranial nerves grossly intact. moves all 4 extremities w/o difficulty. Affect pleasant   Telemetry   NSR, ST - personally reviewed.   EKG    Sinus tach - personally reviewed.   Labs    CBC  Recent Labs  12/04/16 0609 12/05/16 0226  WBC 5.3 9.6  HGB 15.1 14.9  HCT 42.6 42.4  MCV 86.8 85.5  PLT 282 260   Basic Metabolic Panel  Recent Labs   16/10/96 0609 12/04/16 1200 12/05/16 0226  NA 136  --  132*  K 4.2  --  3.9  CL 106  --  101  CO2 18*  --  19*  GLUCOSE 202*  --  148*  BUN 9  --  13  CREATININE 0.88  --  1.07  CALCIUM 8.3*  --  7.9*  MG  --  1.4*  --    Liver Function Tests  Recent Labs  12/04/16 1120 12/05/16 0226  AST 61* 33  ALT 29 23  ALKPHOS 101 87  BILITOT 1.1 1.5*  PROT 7.4 6.4*  ALBUMIN 2.7* 2.3*   No results for input(s): LIPASE, AMYLASE in the last 72 hours. Cardiac Enzymes  Recent Labs  12/04/16 1120 12/04/16 1644 12/04/16 2221  TROPONINI 0.04* 0.09* 0.06*    BNP: BNP (last 3 results)  Recent Labs  09/21/16 1500 10/19/16 1408 12/04/16 0609  BNP 1,292.9* 433.4* 1,958.9*    ProBNP (last 3 results) No results for input(s): PROBNP in the last 8760 hours.   D-Dimer  Recent Labs  12/04/16 0609  DDIMER 1.44*    Imaging    Ct Angio Chest Pe W Or Wo Contrast  Result Date: 12/04/2016  CLINICAL DATA:  Short of breath and chest pain since last night EXAM: CT ANGIOGRAPHY CHEST WITH CONTRAST TECHNIQUE: Multidetector CT imaging of the chest was performed using the standard protocol during bolus administration of intravenous contrast. Multiplanar CT image reconstructions and MIPs were obtained to evaluate the vascular anatomy. CONTRAST:  100 cc Isovue 370 COMPARISON:  09/09/2015 FINDINGS: Cardiovascular: There are small filling defects within the tiny subsegmental branches of the bilateral lower lobe pulmonary artery branches. See images 121 and 100 and 15. This could possibly represent mixing artifact but differentiation between true pulmonary thromboemboli and artifact is difficult. No lobar are central pulmonary emboli. Maximal diameter of the ascending aorta is 4.0 cm. There is no evidence of right heart strain by measurement criteria. Atherosclerotic calcifications of the aortic arch and 3 vessel coronary artery's. Systemic circulation is non-opacified. Mediastinum/Nodes: Mild  mediastinal adenopathy. 11 mm precarinal node. Innumerable but smaller AP window nodes. 14 mm pretracheal node on image 43. Prominent peribronchovascular soft tissues in the hilar regions. Thyroid is unremarkable. No pericardial effusion. Lungs/Pleura: Moderate bilateral pleural effusions are slightly larger. Scattered ground-glass throughout both lungs is present. Prominent peribronchovascular soft tissues extends around the airways of both lower lobes. No pneumothorax. Upper Abdomen: 13 mm short axis diameter gastrohepatic ligament lymph node. Musculoskeletal: No new thoracic compression deformity. No sternal fracture. No evidence of rib fracture pre Review of the MIP images confirms the above findings. IMPRESSION: There are small bilateral lower lobe subsegmental pulmonary thromboemboli. Bilateral pleural effusions. Bilateral ground-glass opacities. Inflammatory changes of the airways in the hilar regions and bilateral lower lobes as described. Mild mediastinal and upper abdominal adenopathy. These findings may reflect volume overload but an inflammatory process or even malignancy cannot be entirely excluded. Correlate clinically. Three to six-month followup can be performed to ensure resolution of these findings. Aortic Atherosclerosis (ICD10-I70.0). Electronically Signed   By: Jolaine Click M.D.   On: 12/04/2016 09:39      Medications:     Scheduled Medications: . aspirin  81 mg Oral Daily  . atorvastatin  20 mg Oral q1800  . digoxin  0.125 mg Oral Daily  . furosemide  80 mg Intravenous BID  . hydrALAZINE  25 mg Oral TID  . insulin aspart  0-9 Units Subcutaneous TID WC  . insulin glargine  20 Units Subcutaneous QHS  . isosorbide mononitrate  30 mg Oral Daily  . nicotine  7 mg Transdermal Daily  . potassium chloride SA  20 mEq Oral Daily  . sacubitril-valsartan  1 tablet Oral BID  . sodium chloride flush  3 mL Intravenous Q12H     Infusions: . sodium chloride    . heparin 1,600 Units/hr  (12/05/16 0429)     PRN Medications:  sodium chloride, acetaminophen, nitroGLYCERIN, ondansetron (ZOFRAN) IV, sodium chloride flush    Patient Profile   [name] is a 61 y.o. male with h/o of NICM, Combined CHF, HTN, DM, CAD, ETOH abuse, and cocaine abuse.   Assessment/Plan   1. Acute on chronic systolic CHF: NICM, EF 10-15% in 07/2016. Related to HTN, cocaine and ETOH abuse - NYHA II-III - Volume elevated on exam in the setting of non compliance - Continue IV lasix today, can transition to po tomorrow. Last clinic weigt was 152 pounds.  - Continue dig 0.125 mcg.  - Continue Entresto 24/26 mg BID - Continue hydralazine 25 mg TID - Continue isosorbide 30 mg daily.  - No beta blocker with ongoing cocaine use  2. CAD - Mild  disease on cath in 2014 - Denies chest pain - Continue daily ASA  3. ETOH and cocaine abuse - Currently living on the [address]. Will consult social work for resources.   4. Non compliance - Will refer back to paramedicine. He is agreeable.   5. VTE: Small bilateral PEs on CTA.   - Xarelto may be best treatment option here as suspect he would not be compliant with INR checks/coumadin.    Length of Stay: 1  Thomas Ishikawa, NP  12/05/2016, 8:06 AM  Advanced Heart Failure Team Pager [phone] (M-F; 7a - 4p)  Please contact [facility] for night-coverage after hours (4p -7a ) and weekends on [email or web address]  Patient seen with NP, agree with the above note.  Mild volume overload on exam today.  Breathing better.  - Restart meds: digoxin 0.125 daily, Entresto 1 tab bid, hydralazine 25 tid, Imdur 30 daily.  Can add spironolactone later this admission most likely.  - Lasix 40 mg IV bid today, probably can switch to po tomorrow.  - Cocaine + this admission, would hold off on beta blocker for the time being.  - With social issues and substance abuse, not a candidate for advanced therapies or home inotrope.   Bilateral small PEs on chest CT.  Given his  situation, think he would be unlikely to comply with warfarin therapy.  Would treat VTE with Xarelto.    Will need social work help for homeless issues, will need help with meds, we will arrange paramedicine.   Marca Ancona 12/05/2016 11:29 AM

## 2016-12-05 NOTE — Progress Notes (Addendum)
ANTICOAGULATION CONSULT NOTE  Pharmacy Consult:  Heparin Indication: pulmonary embolus  No Known Allergies  Patient Measurements: Height: 6\' 2"  (188 cm) Weight: 160 lb 15 oz (73 kg) IBW/kg (Calculated) : 82.2 Heparin Dosing Weight: 75 kg  Vital Signs: Temp: 98 F (36.7 C) (08/13 0800) Temp Source: Oral (08/13 0800) BP: 119/79 (08/13 0800) Pulse Rate: 96 (08/13 0800)  Assessment: 61 YOM presented with chest pressure and found to have bilateral PEs on CT.  Pharmacy consulted to initiate IV heparin, orders to add warfarin tonight 8/13.  Heparin level = 0.4 this morning, cbc stable, no bleeding noted.  Discussed with cardiology and they feel d/t compliance issues and homelessness will change to xarelto. He will require BID dosing of xarelto in the initial 21 days then daily thereafter.  Goal of Therapy:  Heparin level 0.3-0.7 units/ml Monitor platelets by anticoagulation protocol: Yes    Plan:  Will stop heparin this am and start xarelto - 15mg  bid x 21 days then 20mg  dailiy Will consult case management but [facility]  Thank you, Sheppard Coil PharmD., BCPS Clinical Pharmacist Pager [phone] 12/05/2016 11:08 AM

## 2016-12-05 NOTE — Evaluation (Signed)
Occupational Therapy Evaluation Patient Details Name: [name] MRN: [id number] DOB: April 29, 1955 Today's Date: 12/05/2016    History of Present Illness Admitted 12/04/16 with SOB, volume overload. Had not taken his medications for 3 days, + cocaine.  PMH: CAD, CHF, HTN,  ETOH and cocaine abuse.   Clinical Impression   Pt admitted with above. He demonstrates the below listed deficits and will benefit from continued OT to maximize safety and independence with BADLs.  Pt currently fatigues quickly with ADL activity.  He requires min A for ADLs and is at risk for falls.  He was living in his car PTA, which is an unsafe environment to return to, and one that he is not at a level to be able to manage safely as he is not independent.  Feel he would benefit from SNF level rehab at discharge.       Follow Up Recommendations  SNF;Supervision/Assistance - 24 hour    Equipment Recommendations  None recommended by OT    Recommendations for Other Services       Precautions / Restrictions Precautions Precautions: Fall Restrictions Weight Bearing Restrictions: No      Mobility Bed Mobility Overal bed mobility: Independent                Transfers Overall transfer level: Needs assistance Equipment used: None Transfers: Sit to/from Stand Sit to Stand: Min guard         General transfer comment: PPt requires min guard assist to steady     Balance Overall balance assessment: Needs assistance;History of Falls Sitting-balance support: No upper extremity supported;Feet supported Sitting balance-Leahy Scale: Fair     Standing balance support: During functional activity Standing balance-Leahy Scale: Poor Standing balance comment: Pt requires min guard assist, and UE support                            ADL either performed or assessed with clinical judgement   ADL Overall ADL's : Needs assistance/impaired Eating/Feeding: Independent   Grooming: Wash/dry  hands;Wash/dry face;Oral care;Brushing hair;Set up;Sitting   Upper Body Bathing: Set up;Sitting   Lower Body Bathing: Minimal assistance;Sit to/from stand   Upper Body Dressing : Set up;Sitting   Lower Body Dressing: Minimal assistance;Sit to/from stand   Toilet Transfer: Minimal assistance;Ambulation;RW Toilet Transfer Details (indicate cue type and reason): min A to steady and for safety.  Pt impulsive  Toileting- Clothing Manipulation and Hygiene: Sit to/from stand;Min guard       Functional mobility during ADLs: Min guard;Minimal assistance;Rolling walker General ADL Comments: Pt fagitues rapidly with ADL activity      Vision         Perception     Praxis      Pertinent Vitals/Pain Pain Assessment: No/denies pain     Hand Dominance     Extremity/Trunk Assessment Upper Extremity Assessment Upper Extremity Assessment: Defer to OT evaluation;Generalized weakness   Lower Extremity Assessment Lower Extremity Assessment: Defer to PT evaluation   Cervical / Trunk Assessment Cervical / Trunk Assessment: Normal   Communication Communication Communication: No difficulties (mumbles)   Cognition Arousal/Alertness: Awake/alert Behavior During Therapy: Flat affect Overall Cognitive Status: Within Functional Limits for tasks assessed                                     General Comments  VSS    Exercises  Shoulder Instructions      Home Living Family/patient expects to be discharged to:: Shelter/Homeless Living Arrangements: Non-relatives/Friends Available Help at Discharge: Available PRN/intermittently;Friend(s)                         Home Equipment: Cane - single point;Walker - 2 wheels   Additional Comments: Pt reports he has been sleeping in his car for two weeks now.  He showers at a friend's home when he can.  Unsure where pt's cane is and he states his RW is in his old house that they are selling. Mom moved in with pts sister  recently but was living with pt before that.        Prior Functioning/Environment Level of Independence: Needs assistance  Gait / Transfers Assistance Needed: reports recent trouble walking with some falls, used cane per pt  ADL's / Homemaking Assistance Needed: only got showers occasionally when friend would allow pt to shower            OT Problem List: Decreased strength;Decreased activity tolerance;Impaired balance (sitting and/or standing);Decreased safety awareness;Decreased knowledge of use of DME or AE;Cardiopulmonary status limiting activity      OT Treatment/Interventions: Self-care/ADL training;Therapeutic exercise;Energy conservation;DME and/or AE instruction;Therapeutic activities;Patient/family education;Balance training    OT Goals(Current goals can be found in the care plan section) Acute Rehab OT Goals Patient Stated Goal: To regain strength  OT Goal Formulation: With patient Time For Goal Achievement: 12/19/16 Potential to Achieve Goals: Good ADL Goals Pt Will Perform Grooming: standing;with supervision Pt Will Perform Upper Body Bathing: standing;with supervision Pt Will Perform Lower Body Bathing: with supervision;sit to/from stand Pt Will Perform Upper Body Dressing: with supervision;sitting Pt Will Perform Lower Body Dressing: with supervision;sit to/from stand Pt Will Transfer to Toilet: with supervision;ambulating;regular height toilet;bedside commode;grab bars Pt Will Perform Toileting - Clothing Manipulation and hygiene: with supervision;sit to/from stand  OT Frequency: Min 2X/week   Barriers to D/C: Decreased caregiver support          Co-evaluation              AM-PAC PT "6 Clicks" Daily Activity     Outcome Measure Help from another person eating meals?: None Help from another person taking care of personal grooming?: A Little Help from another person toileting, which includes using toliet, bedpan, or urinal?: A Little Help from another  person bathing (including washing, rinsing, drying)?: A Little Help from another person to put on and taking off regular upper body clothing?: A Little Help from another person to put on and taking off regular lower body clothing?: A Little 6 Click Score: 19   End of Session Equipment Utilized During Treatment: Rolling walker Nurse Communication: Mobility status  Activity Tolerance: Patient limited by fatigue Patient left: in bed;with call bell/phone within reach;with bed alarm set  OT Visit Diagnosis: Unsteadiness on feet (R26.81)                Time: 6301-6010 OT Time Calculation (min): 20 min Charges:  OT General Charges $OT Visit: 1 Procedure OT Evaluation $OT Eval Moderate Complexity: 1 Procedure G-Codes:     Jeani Hawking, OTR/L [phone]   Jeani Hawking M 12/05/2016, 7:11 PM

## 2016-12-05 NOTE — Progress Notes (Signed)
Lasandra Beech -[facility] LCSW and I visited patient.  He is known from [facility].  The [facility] [facility] have been unable to reach him for several weeks.  He tells me that his cell phone is not working and his mother went to live with his sister and they "put me on streets".  He has been living in his car for approximately 1 month.  Of note he has also missed recent follow-up appts in the [facility]. He does say that he has had most medications and has been taking those as prescribed.  He is agreeable to continue with [facility] and will provide his cell phone as primary contact number-("should be back on by Wed.")  Annice Pih will investigate housing options for discharge.  He will also likely need medication assistance through the [facility] for any new medications at discharge.

## 2016-12-05 NOTE — Progress Notes (Signed)
Subjective: [name] reports feeling well this morning. He states that his breathing feels easier and that he hasn't felt as short of breath as he had yesterday. He feels that the swelling in his legs has gone down overnight. He hasn't been able to get up and walk around much, but when he has, he has still felt more short of breath than his baseline. He also experienced some sweating yesterday afternoon while still awake, but had not been exerting himself at the time. He denies any current chest pain, fevers, abdominal pain or changes in stool or urine.  Objective: Vital signs in last 24 hours: Vitals:   12/04/16 2000 12/04/16 2300 12/05/16 0328 12/05/16 0800  BP: 119/78 118/84 127/82 119/79  Pulse:  (!) 105 100 96  Resp: (!) 22 (!) 22 (!) 21 19  Temp:  97.7 F (36.5 C) (!) 97.4 F (36.3 C) 98 F (36.7 C)  TempSrc:  Oral Oral Oral  SpO2:  97% 97% 99%  Weight:   73 kg (160 lb 15 oz)   Height:       Weight change: -3.443 kg (-7 lb 9.5 oz)  Intake/Output Summary (Last 24 hours) at 12/05/16 1054 Last data filed at 12/05/16 0859  Gross per 24 hour  Intake           474.98 ml  Output             1615 ml  Net         -1140.02 ml   General appearance: alert, cooperative and no distress Lungs: bibasilar crackles, improved from yesterday Heart: regular rate and rhythm, S1, S2 normal, no murmur, click, rub or gallop Abdomen: soft, non-tender; bowel sounds normal; no masses,  no organomegaly Extremities: edema decreased from yesterday Lab Results: Troponin-I (1120): 0.04 Troponin-I (1644): 0.09 Troponin-I (2221): 0.06 CBC wnl BMP: Na+ 132, K+ 3.9, Cl- 109, CO2 19, BUN 13, Cr 1.07, glucose 148  Micro Results: Recent Results (from the past 240 hour(s))  MRSA PCR Screening     Status: None   Collection Time: 12/04/16  3:58 PM  Result Value Ref Range Status   MRSA by PCR NEGATIVE NEGATIVE Final    Comment:        The GeneXpert MRSA Assay (FDA approved for NASAL  specimens only), is one component of a comprehensive MRSA colonization surveillance program. It is not intended to diagnose MRSA infection nor to guide or monitor treatment for MRSA infections.    Studies/Results: Dg Chest 2 View  Result Date: 12/04/2016 CLINICAL DATA:  61 y/o M; mid chest pain that started 2 hours prior to arrival. EXAM: CHEST  2 VIEW COMPARISON:  08/05/2016 and 04/12/2016 chest radiograph FINDINGS: Small right-greater-than-left pleural effusions and bibasilar opacities. Stable cardiac silhouette given projection and technique. Mild degenerative changes of the spine. No acute osseous abnormality is evident. IMPRESSION: Small bilateral pleural effusions and bibasilar opacities probably representing associated atelectasis, similar to prior radiographs given differences in technique. Electronically Signed   By: Mitzi Hansen M.D.   On: 12/04/2016 06:27   Ct Angio Chest Pe W Or Wo Contrast  Result Date: 12/04/2016 CLINICAL DATA:  Short of breath and chest pain since last night EXAM: CT ANGIOGRAPHY CHEST WITH CONTRAST TECHNIQUE: Multidetector CT imaging of the chest was performed using the standard protocol during bolus administration of intravenous contrast. Multiplanar CT image reconstructions and MIPs were obtained to evaluate the vascular anatomy. CONTRAST:  100 cc Isovue 370 COMPARISON:  09/09/2015 FINDINGS: Cardiovascular: There are  small filling defects within the tiny subsegmental branches of the bilateral lower lobe pulmonary artery branches. See images 121 and 100 and 15. This could possibly represent mixing artifact but differentiation between true pulmonary thromboemboli and artifact is difficult. No lobar are central pulmonary emboli. Maximal diameter of the ascending aorta is 4.0 cm. There is no evidence of right heart strain by measurement criteria. Atherosclerotic calcifications of the aortic arch and 3 vessel coronary artery's. Systemic circulation is  non-opacified. Mediastinum/Nodes: Mild mediastinal adenopathy. 11 mm precarinal node. Innumerable but smaller AP window nodes. 14 mm pretracheal node on image 43. Prominent peribronchovascular soft tissues in the hilar regions. Thyroid is unremarkable. No pericardial effusion. Lungs/Pleura: Moderate bilateral pleural effusions are slightly larger. Scattered ground-glass throughout both lungs is present. Prominent peribronchovascular soft tissues extends around the airways of both lower lobes. No pneumothorax. Upper Abdomen: 13 mm short axis diameter gastrohepatic ligament lymph node. Musculoskeletal: No new thoracic compression deformity. No sternal fracture. No evidence of rib fracture pre Review of the MIP images confirms the above findings. IMPRESSION: There are small bilateral lower lobe subsegmental pulmonary thromboemboli. Bilateral pleural effusions. Bilateral ground-glass opacities. Inflammatory changes of the airways in the hilar regions and bilateral lower lobes as described. Mild mediastinal and upper abdominal adenopathy. These findings may reflect volume overload but an inflammatory process or even malignancy cannot be entirely excluded. Correlate clinically. Three to six-month followup can be performed to ensure resolution of these findings. Aortic Atherosclerosis (ICD10-I70.0). Electronically Signed   By: Jolaine Click M.D.   On: 12/04/2016 09:39   Medications: I have reviewed the patient's current medications. Scheduled Meds: . aspirin  81 mg Oral Daily  . atorvastatin  20 mg Oral q1800  . digoxin  0.125 mg Oral Daily  . furosemide  40 mg Intravenous BID  . hydrALAZINE  25 mg Oral TID  . insulin aspart  0-9 Units Subcutaneous TID WC  . insulin glargine  20 Units Subcutaneous QHS  . isosorbide mononitrate  30 mg Oral Daily  . nicotine  7 mg Transdermal Daily  . potassium chloride SA  20 mEq Oral Daily  . sacubitril-valsartan  1 tablet Oral BID  . sodium chloride flush  3 mL Intravenous  Q12H   Continuous Infusions: . sodium chloride    . heparin 1,600 Units/hr (12/05/16 0429)   PRN Meds:.sodium chloride, acetaminophen, nitroGLYCERIN, ondansetron (ZOFRAN) IV, sodium chloride flush Assessment/Plan: Principal Problem:   Acute exacerbation of CHF (congestive heart failure) (HCC) Active Problems:   H/O noncompliance with medical treatment, presenting hazards to health   Chronic systolic CHF (congestive heart failure) [facility])  [name] is a 61 year old with a past medical history of CHF, HTN, CAD, type II DM and alcohol and cocaine abuse who presents due to chest pain in the setting of volume overload.  Chest pain in the setting of volume overload: His chest pain is less likely to be due to ACS now that troponins elevated and then went back down and EKG did not show any signs of ischemia. He has remained on the heparin drip (followed by pharmacy) to combat his small pulmonary thromboemboli. He has responded well symptomatically to the diuretics given to him overnight and this morning, and he has had a net negative of about 1L in the past day. His weight today (160.9 lbs) is increased from the measurement yesterday (157.4 lbs), but that first measurement was taken using the bed scale. With all this in mind, it is most likely that he experienced chest  tightness in the setting of increased demand due to volume overload from a CHF exacerbation. His exam this morning shows decreased peripheral edema and decreased crackles in his lungs along with warm extremities. This, along with his symptomatic improvement, shows that his diuresis has been helpful. Cardiology have seen him and recommend continuing his home medications. They do not recommend aggressive intervention at this time, due to his risk of noncompliance. This morning, they have recommended to decrease his lasix dose to IV 40 mg BID, and then transition to PO tomorrow. - continue to follow cardiology's recommendations - continue  Imdur 30 mg daily - begin Xarelto per cardiology  HFrEF exacerbation: [name] was admitted for a similar presentation of progressive shortness of breath in April. At that time, he had not been adherent to his medications. After that admission, he had been followed by the paramedicine team as a function of the CHF clinic, but this stopped due to his homelessness and lack of a phone for them to reach him with. He states that he has not been taking his medications over the last few days due to lack of access to them. CXR and CT chest show pulmonary edema consistent with volume overload. BNP 1958.9 on admission. He received IV 40 mg lasix x2 in the ED. - per cardiology:             - continue home heart failure meds (Imdur 30 mg qd, K-Dur 20 mEq qd, Entresto 24-26 mg, asa 81 mg qd, atorvastatin 20 mg qd.             - check co-oximetry - IV lasix 40 mg BID - attempt to reconnect him with paramedicine  Small bilateral lower lobe pulmonary emboli: CT angiogram showed signs of small bilateral lower lobe subsegmental pulmonary emboli. His complaint of chest pain, together with shortness of breath, bilateral lower extremity edema and a D-dimer of 1.44 indicates that pulmonary emboli may be at least partially explain his current clinical presentation. However, his shortness of breath, chest pain and oxygen requirement are most likely due to a combination of his pulmonary emboli and his CHF exacerbation. He has improved symptomatically on lasix, so this is more likely the cause. - continue heparin drip, transition to Xarelto - pharmacy consulted, appreciate their recommendations  Substance abuse: UDS positive for cocaine. Blood alcohol <5 on admission. He admitted to using both on Saturday. No signs of alcohol withdrawal so far. Mentation seems better compared to yesterday. - CIWA protocol to monitor for alcohol withdrawal - Nicotine patch 7 mg  Type II DM: He takes metformin 1000 mg BID at home.  Glucose on admission 202. Most recent A1c was 9.6 on 08/05/16. His blood sugars have been well controlled since admission. - continue Lantus 20 units and SSI  Depression/anxiety: We will continue to monitor his mental status and mood, but we will hold his anxiolytics for now as they may suppress respiratory drive.  This is a Psychologist, occupational Note.  The care of the patient was discussed with Dr. Saunders Revel and the assessment and plan formulated with their assistance.  Please see their attached note for official documentation of the daily encounter.   LOS: 1 day   Shon Baton, Medical Student 12/05/2016, 10:54 AM

## 2016-12-05 NOTE — Progress Notes (Addendum)
ANTICOAGULATION CONSULT NOTE - Follow Up Consult  Pharmacy Consult for heparin Indication: pulmonary embolus  Labs:  Recent Labs  12/04/16 0609 12/04/16 1120 12/04/16 1644 12/04/16 2221 12/05/16 0009 12/05/16 0226  HGB 15.1  --   --   --   --  14.9  HCT 42.6  --   --   --   --  42.4  PLT 282  --   --   --   --  260  HEPARINUNFRC  --   --  0.30  --  0.31 0.22*  CREATININE 0.88  --   --   --   --  1.07  TROPONINI  --  0.04* 0.09* 0.06*  --   --     Assessment: 61yo male therapeutic on heparin after rate change though at very low end of goal, would prefer higher level w/ bilateral PE.  Goal of Therapy:  Heparin level 0.3-0.7 units/ml   Plan:  Will increase heparin gtt slightly to 1400 units/hr and check level with am labs.  Vernard Gambles, PharmD, BCPS  12/05/2016,12:56 AM   ADDENDUM: Heparin level further decreased after rate increase.  Will increase heparin gtt by ~3 units/kg/hr to 1600 units/hr and check level in 6h. VB  4:38 AM

## 2016-12-05 NOTE — Progress Notes (Signed)
Internal Medicine Attending  Date: 12/05/2016  Patient name: [name] Medical record number: [id number] Date of birth: Mar 06, 1956 Age: 61 y.o. Gender: male  I saw and evaluated the patient. I reviewed the resident's note by Dr. Saunders Revel and I agree with the resident's findings and plans as documented in her progress note.  Please see my H&P dated 12/05/2016 and attached to Dr. Rogers Seeds H&P dated 12/04/2016 for the specifics of my evaluation, assessment, and plan from earlier in the day.

## 2016-12-05 NOTE — Progress Notes (Signed)
Pt arrived on [address]; pt has been oriented to room, surroundings, and any applicable equipment. VSS.  CCMD notified of telemetry. Pt has been educated on the Falls Prevention Plan.  Pt in stable condition. 

## 2016-12-05 NOTE — Progress Notes (Signed)
Subjective:  [name] was sitting comfortably in bed this morning, eating breakfast in no acute distress. The patient states that he feels better and thinks that his breathing has somewhat improved from yesterday. He continues to note, however, that when he does not wear his nasal cannula or when he lays flat that he has "increased anxiety" and he feels like he cannot catch his breath. He has been urinating more frequently and thinks his feet are less swollen than they were yesterday.   Objective:  Vital signs in last 24 hours: Vitals:   12/05/16 0328 12/05/16 0800 12/05/16 1100 12/05/16 1233  BP: 127/82 119/79 111/71   Pulse: 100 96 98 (!) 112  Resp: (!) 21 19 (!) 22 (!) 27  Temp: (!) 97.4 F (36.3 C) 98 F (36.7 C) 98.3 F (36.8 C)   TempSrc: Oral Oral Oral   SpO2: 97% 99% 98% 98%  Weight: 160 lb 15 oz (73 kg)     Height:       Physical Exam  Constitutional: He appears well-developed and well-nourished. No distress.  Neck: No JVD present.  Cardiovascular: Normal rate, regular rhythm, normal heart sounds and intact distal pulses.   No murmur heard. Distal upper extremities warm to touch, distal lower extremities warm but subjectively cooler than distal upper extremities.   Pulmonary/Chest: Effort normal and breath sounds normal. No respiratory distress.  No crackles appreciated bialterally  Abdominal: Soft. He exhibits no distension. There is no tenderness. There is no guarding.  Musculoskeletal: He exhibits edema (trace edema to on ankles (L>R)). He exhibits no tenderness (of bilateral lower extremities).  Skin: Skin is warm and dry. No rash noted. He is not diaphoretic. No erythema.   Assessment/Plan:  Principal Problem:   Acute exacerbation of CHF (congestive heart failure) (HCC) Active Problems:   H/O noncompliance with medical treatment, presenting hazards to health   Chronic systolic CHF (congestive heart failure) (HCC)   Myocardial ischemia   Pulmonary embolus  (HCC)   Cocaine abuse  [name] is a 61 yo with a PMH of CHF (EF =10-15%), nonobstructive CAD, nonischemic cardiomyopathy, polysubstance abuse, depression and anxiety who presented to the ED with an acute onset of substernal, non-radiating chest pain. He was found to be hypoxic, tachycardic, and tachypneic on presentation. He was admitted to the internal medicine teaching service for management. The specific problems addressed on his admission are as follows.  Acute exacerbation of chronic heart failure with reduced EF (4/18 echo EF = 10-15%): The patient was hospitalized with a similar presentation of progressive shortness of breath on 08/05/2016. At that time the patient had not taken his medications. After this admission the patient was being followed by paramedicine team, but recently fell out of contact 2/2 homelessness. The patient's chest imaging on admission showed bilateral pleural effusions and his BNP was elevated to 1,958. His initial description of chest pain and recent history of cocaine use was concerning for ACS, however his EKG showed no signs of ST elevation and overnight troponin levels were consistent with CHF exacerbation and not an acute NSTEMI. His breathing improved with IV lasix, as he was able to tolerate 2L [place] during rounds without increasing RR or desaturations.  -CHF consulted, appreciate recommendations -Daily weights while inpatient -IV lasix 40 mg BID with plans to transition to PO tomorrow -KDur 20 mEq while on lasix -Continue home digoxin 0.125 mg , IMDUR 30 mg , Entresto 24-26 mg BID, aspirin 81 mg, and atorvastatin 20 mg [ ]   BMP with Mg in the morning  Small bilateral lower lobe subsegmental pulmonary emboli: The patient's initial complaint was of chest pain. He had worsening SOB and a new oxygen requirement on presentation to ED. The patient's tachycardia gave him a modified geneva score of 5, moderate risk. His D-dimer was elavated to 1.44 and he had a CT  angio which showed small, subsegmental bilateral pulmonary emboli. His SOB and increased oxygen requirement is likely multifactorial, with small bilateral PE's and CHF exacerbation contributing to his problem. He was transitioned from heparin to rivaroxaban today for treatment -Start Xarelto,15 mg BID for treatment of PE (loading dose); maintenance dose of 20 mg daily to start on 12/26/2016  Hx of substance abuse, including alcohol and cocaine: Alcohol on admission <5. UDS + for cocaine.  -CIWA protocol for monitoring of alcohol withdraw symptoms, hold Ativan as much as possible for decreased respiratory drive -Nicotine Patch 7 mg TD  Hx of insulin dependent diabetes: Patient on metformin 1000 mg BID at home. Glucose on admission 202. Previously took 40 units Lantus at home, will monitor glucose throughout admission. -20 units Lantus qNightly -SSI with meals  Hx of depression/anxiety: Holding any medications that could suppress respiratory drive, including home Xanax 1 mg TID and trazadone 100 mg, until patient completely weaned of supplemental oxygen.   FEN/GI: -Heart healthy diet with fluid restriction  Dispo: Anticipated discharge in approximately 2-3 day(s).   Rozann Lesches, MD 12/05/2016, 3:24 PM Pager: [phone]

## 2016-12-05 NOTE — H&P (Signed)
Internal Medicine Attending Admission Note Date: 12/05/2016  Patient name: [name] Medical record number: [id number] Date of birth: 30-Mar-1956 Age: 61 y.o. Gender: male  I saw and evaluated the patient. I reviewed the resident's note and I agree with the resident's findings and plan as documented in the resident's note.  Chief Complaint(s): Chest pain and progressive dyspnea on exertion.  History - key components related to admission:  [name] is a 61 year old man with a history of chronic systolic and diastolic heart failure, coronary artery disease, hypertension, diabetes, anxiety, and alcohol and cocaine abuse who was in his usual state of health until a few days prior to admission when he began to notice progressive dyspnea on exertion after he lost access to his car where he stored his medications and therefore was not taking his usual heart failure medications. On the morning of admission, when changing the car battery, he developed the onset of chest pressure that did not radiate, but was associated with diaphoresis and shortness of breath. There was not associated nausea or dizziness. He therefore presented to the emergency department where he was found to have acute on chronic combined systolic and diastolic heart failure and small pulmonary emboli. He admits to both alcohol and cocaine use the day prior to admission. He has been homeless for a few weeks and therefore has not been participating in his paramedicine evaluations for his chronic combined systolic and diastolic heart failure. He was admitted to the internal medicine teaching service for further evaluation and care.  When seen on rounds the morning following admission he subjectively felt slightly improved without chest pain but admitted that his dyspnea was unchanged. He was without other acute complaints.  Physical Exam - key components related to admission:  Vitals:   12/05/16 0328 12/05/16 0800 12/05/16 1100  12/05/16 1233  BP: 127/82 119/79 111/71   Pulse: 100 96 98 (!) 112  Resp: (!) 21 19 (!) 22 (!) 27  Temp: (!) 97.4 F (36.3 C) 98 F (36.7 C) 98.3 F (36.8 C)   TempSrc: Oral Oral Oral   SpO2: 97% 99% 98% 98%  Weight: 160 lb 15 oz (73 kg)     Height:       Gen.: Well-developed, well-nourished, man sitting comfortably in bed in no acute distress. Neck: Unable to appreciate jugular venous distention. Lungs: Decreased breath sounds at the bilateral bases with occasional basilar inspiratory crackles posteriorly. There were no wheezes or rhonchi appreciated. Heart: Regular rate and rhythm without murmurs, rubs, or gallops. Extremities: Without edema. His extremities were mildly cool to the touch.  Lab results:  Basic Metabolic Panel:  Recent Labs  40/98/11 0609 12/04/16 1200 12/05/16 0226  NA 136  --  132*  K 4.2  --  3.9  CL 106  --  101  CO2 18*  --  19*  GLUCOSE 202*  --  148*  BUN 9  --  13  CREATININE 0.88  --  1.07  CALCIUM 8.3*  --  7.9*  MG  --  1.4*  --    Liver Function Tests:  Recent Labs  12/04/16 1120 12/05/16 0226  AST 61* 33  ALT 29 23  ALKPHOS 101 87  BILITOT 1.1 1.5*  PROT 7.4 6.4*  ALBUMIN 2.7* 2.3*   CBC:  Recent Labs  12/04/16 0609 12/05/16 0226  WBC 5.3 9.6  HGB 15.1 14.9  HCT 42.6 42.4  MCV 86.8 85.5  PLT 282 260   Cardiac Enzymes:  Recent  Labs  12/04/16 1120 12/04/16 1644 12/04/16 2221  TROPONINI 0.04* 0.09* 0.06*   D-Dimer:  Recent Labs  12/04/16 0609  DDIMER 1.44*   CBG:  Recent Labs  12/04/16 1712 12/05/16 0815 12/05/16 1205  GLUCAP 210* 80 191*   Urine Drug Screen:  Positive for cocaine Negative for opiates, benzodiazepines, amphetamines, THC, barbiturates.  Alcohol Level:  Recent Labs  12/04/16 1120  ETH <5   Misc. Labs:  Digoxin undetectable BNP 1958.9  Imaging results:   Chest x-ray: Personally reviewed. Lateral pleural effusions right greater than left, no infiltrates or masses.  CT  angiogram of the chest: Personally reviewed. Bilateral patchy infiltrates in a groundglass appearance as well as bilateral pleural effusions right greater than left. Radiology determine there were small pulmonary emboli.  Other results:  EKG (12/04/2016): Personally reviewed. Normal sinus tachycardia at 107 bpm, 1 PVC, borderline right axis deviation, septal Q waves, no LVH by voltage, delayed R wave progression, inferolateral T wave inversion in a strain pattern which is new compared to the previous ECG on 08/05/2016.  EKG (12/05/2016): Personally reviewed. Normal sinus rhythm at 99 bpm, 1 PVC, normal axis, normal intervals, no significant Q waves, LVH by voltage in the precordial leads, reversion of the T-wave inversions to the baseline upright T waves inferolaterally when compared to the ECG of 08/05/2016.  Assessment & Plan by Problem:  [name] is a 61 year old man with a history of chronic systolic and diastolic heart failure, coronary artery disease, hypertension, diabetes, anxiety, and alcohol and cocaine abuse who was in his usual state of health until a few days prior to admission when he began to notice progressive dyspnea on exertion after he lost access to his car where he stored his medications and therefore was not taking his usual heart failure medications. There are multiple issues that are contributing to his acute presentation. First of all, he has been unable to take his regular heart failure medications. Because of this, he is volume overloaded and therefore has an acute on chronic combined systolic and diastolic heart failure exacerbation. In addition, he has small pulmonary emboli. It is unclear if these are clinically significant, but given his serious underlying heart failure any pertubation in his cardiopulmonary status is likely to be significant. Finally, the fact that he continues to use cocaine is likely contributing to his heart failure exacerbation and mild ischemia. As a  single unifying diagnosis for this presentation he likely has type II myocardial infarction secondary to demand ischemia related to volume overload in the setting of acute on chronic combined systolic and diastolic heart failure, right ventricular strain related to the pulmonary emboli, and myocardial demand mismatch related to the hypersympathetic tone associated with the cocaine abuse.  1) Type II myocardial ischemia: We will continue to counsel him on the importance of avoiding cocaine. We will also try to maximize his cardiopulmonary volume status. Because of the type II myocardial ischemia his long term prognosis is quite poor.  2) Acute on chronic combined systolic and diastolic heart failure: He is being diuresed with intravenous loop diuretics. We have also reinstituted his other heart failure medications to include digoxin, hydralazine, Imdur, and Ernesto. He is not a candidate for beta blockers given his continued cocaine abuse. We will continue his outpatient doses of each of these medications and allow his primary care provider to adjust upwards the doses to lower the afterload as much as possible before the development of symptoms.  3) Acute pulmonary emboli: Unclear clinical significance  but requires anticoagulation nonetheless. Given his current social situation and lack of social support there is no ideal anticoagulant. His lack of transportation likely means that INRs will be difficult to follow. His lack of compliance means that the use of a DOAC intermittently may not achieve the persistent anticoagulation necessary. Nonetheless, all things taken into account, since he is a [place] patient, he may be able to have access to DOACs. We will therefore start him on a DOAC and likely need to provide him with a supply until he can be seen by the [place] where they can then further supply his DOAC for anticoagulation. Although the risk factor is likely related to stasis from underlying heart failure,  technically a 6 month course of anticoagulation would be reasonable initially for an "unprovoked" thromboembolism.  4) Alcohol abuse: He will be on a CIWA protocol while in the hospital in order to address any developing alcohol withdrawal.  5) Disposition: He will be discharged home once we have better control of his volume status and have made a final decision on anticoagulation. [facility] and who [facility] with his [facility].

## 2016-12-05 NOTE — Evaluation (Addendum)
Physical Therapy Evaluation Patient Details Name: [name] MRN: [id number] DOB: 1955-07-24 Today's Date: 12/05/2016   History of Present Illness  Admitted 12/04/16 with SOB, volume overload. Had not taken his medications for 3 days, + cocaine.  PMH: CAD, CHF, HTN,  ETOH and cocaine abuse.  Past Medical History:  Diagnosis Date  . CHF (congestive heart failure) (HCC)   . Coronary artery disease   . Depression   . Diabetes mellitus   . Hypertension   . Mental disorder   . MI (myocardial infarction) (HCC) 44   age 39 per patient    Clinical Impression  Pt admitted with above diagnosis. Pt currently with functional limitations due to the deficits listed below (see PT Problem List). Pt was able to ambulate without device with min to min guard assist with pt reaching for furniture for balance.  USed cane PTA per pt but had a RW as well that he thinks is with his mom.  Will follow pt but feel that SW will need to be involved as he currently is living in his car per pt.  Will need assist with D/C plan most likely.  Will follow acutely and progress pt as able.   Pt will benefit from skilled PT to increase their independence and safety with mobility to allow discharge to the venue listed below.      Follow Up Recommendations SNF;Supervision/Assistance - 24 hour    Equipment Recommendations  None recommended by PT    Recommendations for Other Services       Precautions / Restrictions Precautions Precautions: Fall Restrictions Weight Bearing Restrictions: No      Mobility  Bed Mobility Overal bed mobility: Independent                Transfers Overall transfer level: Needs assistance Equipment used: None Transfers: Sit to/from Stand Sit to Stand: Min guard         General transfer comment: Pt was able to stand up with guard assist as he states he is unsteady.  Did not lose balance at EOB.   Ambulation/Gait Ambulation/Gait assistance: Min assist Ambulation  Distance (Feet): 20 Feet (10 feet x 2) Assistive device: None Gait Pattern/deviations: Step-through pattern;Decreased stride length;Shuffle;Drifts right/left;Wide base of support   Gait velocity interpretation: Below normal speed for age/gender General Gait Details: Pt was able to ambulate without device to the bathroom with close guard to min assist for balance.  Pt generally steady with this ambulation however guarded for safety as he reports falls.  Pt too sleepy to walk further.  Pt cleaned himself for the most part but did help pt wipe his bottom due to lines getting in the way.   Stairs            Wheelchair Mobility    Modified Rankin (Stroke Patients Only)       Balance Overall balance assessment: Needs assistance;History of Falls Sitting-balance support: No upper extremity supported;Feet supported Sitting balance-Leahy Scale: Fair     Standing balance support: No upper extremity supported;During functional activity Standing balance-Leahy Scale: Poor Standing balance comment: Pt reaching for furniture as he walked as he felt unsteady.  Needed at least one UE support at times for balance.                              Pertinent Vitals/Pain Pain Assessment: No/denies pain   HR 103-116 bpm, 98% on 2.5 L, O2 93-94% on RA  with ambulation to bathroom. Replaced O2 as pt with DOE 3/4 with ambulation.  BP initially 106/65 but decr to 85/60 by end of treatment with pt asymptomatic.  Nursing made aware of decr BP.   Home Living Family/patient expects to be discharged to:: Shelter/Homeless Living Arrangements: Non-relatives/Friends Available Help at Discharge: Available PRN/intermittently;Friend(s)           Home Equipment: Cane - single point;Walker - 2 wheels Additional Comments: Pt reports he has been sleeping in his car for two weeks now.  He showers at a friend's home when he can.  Unsure where pt's cane is and he states his RW is in his old house that they  are selling. Mom moved in with pts sister recently but was living with pt before that.      Prior Function Level of Independence: Needs assistance   Gait / Transfers Assistance Needed: reports recent trouble walking with some falls, used cane per pt   ADL's / Homemaking Assistance Needed: only got showers occasionally when friend would allow pt to shower        Hand Dominance        Extremity/Trunk Assessment   Upper Extremity Assessment Upper Extremity Assessment: Defer to OT evaluation    Lower Extremity Assessment Lower Extremity Assessment: Generalized weakness    Cervical / Trunk Assessment Cervical / Trunk Assessment: Normal  Communication   Communication: Expressive difficulties (mumbles)  Cognition Arousal/Alertness: Awake/alert Behavior During Therapy: Flat affect Overall Cognitive Status: Within Functional Limits for tasks assessed                                        General Comments      Exercises     Assessment/Plan    PT Assessment Patient needs continued PT services  PT Problem List Decreased strength;Decreased activity tolerance;Decreased balance;Decreased mobility;Decreased coordination;Decreased knowledge of use of DME;Decreased safety awareness;Decreased knowledge of precautions       PT Treatment Interventions DME instruction;Gait training;Therapeutic activities;Functional mobility training;Therapeutic exercise;Balance training;Patient/family education    PT Goals (Current goals can be found in the Care Plan section)  Acute Rehab PT Goals Patient Stated Goal: to get better PT Goal Formulation: With patient Time For Goal Achievement: 12/19/16 Potential to Achieve Goals: Good    Frequency Min 3X/week   Barriers to discharge Decreased caregiver support Pt is homeless    Co-evaluation               AM-PAC PT "6 Clicks" Daily Activity  Outcome Measure Difficulty turning over in bed (including adjusting  bedclothes, sheets and blankets)?: None Difficulty moving from lying on back to sitting on the side of the bed? : A Little Difficulty sitting down on and standing up from a chair with arms (e.g., wheelchair, bedside commode, etc,.)?: A Little Help needed moving to and from a bed to chair (including a wheelchair)?: A Little Help needed walking in hospital room?: A Little Help needed climbing 3-5 steps with a railing? : A Lot 6 Click Score: 18    End of Session Equipment Utilized During Treatment: Gait belt;Oxygen Activity Tolerance: Patient limited by fatigue Patient left: in bed;with call bell/phone within reach;with bed alarm set Nurse Communication: Mobility status and decr in BP. PT Visit Diagnosis: Unsteadiness on feet (R26.81);Repeated falls (R29.6);Muscle weakness (generalized) (M62.81)    Time: 0981-1914 PT Time Calculation (min) (ACUTE ONLY): 33 min   Charges:  PT Evaluation $PT Eval Moderate Complexity: 1 Mod PT Treatments $Gait Training: 8-22 mins   PT G Codes:        Jonie Burdell,PT Acute Rehabilitation [phone] (pager)   Berline Lopes 12/05/2016, 10:33 AM

## 2016-12-06 LAB — GLUCOSE, CAPILLARY
GLUCOSE-CAPILLARY: 111 mg/dL — AB (ref 65–99)
Glucose-Capillary: 207 mg/dL — ABNORMAL HIGH (ref 65–99)
Glucose-Capillary: 214 mg/dL — ABNORMAL HIGH (ref 65–99)
Glucose-Capillary: 183 mg/dL — ABNORMAL HIGH (ref 65–99)

## 2016-12-06 LAB — BASIC METABOLIC PANEL
Anion gap: 7 (ref 5–15)
BUN: 19 mg/dL (ref 6–20)
CO2: 24 mmol/L (ref 22–32)
CREATININE: 1.15 mg/dL (ref 0.61–1.24)
Calcium: 7.6 mg/dL — ABNORMAL LOW (ref 8.9–10.3)
Chloride: 104 mmol/L (ref 101–111)
GFR calc Af Amer: >60 mL/min (ref >60)
GLUCOSE: 87 mg/dL (ref 65–99)
POTASSIUM: 3.4 mmol/L — AB (ref 3.5–5.1)
Sodium: 135 mmol/L (ref 135–145)

## 2016-12-06 LAB — CBC
HEMATOCRIT: 40.6 % (ref 39.0–52.0)
Hemoglobin: 14.5 g/dL (ref 13.0–17.0)
MCH: 31 pg (ref 26.0–34.0)
MCHC: 35.7 g/dL (ref 30.0–36.0)
MCV: 86.9 fL (ref 78.0–100.0)
PLATELETS: 193 10*3/uL (ref 150–400)
RBC: 4.67 MIL/uL (ref 4.22–5.81)
RDW: 13 % (ref 11.5–15.5)
WBC: 4.4 10*3/uL (ref 4.0–10.5)

## 2016-12-06 LAB — MAGNESIUM: Magnesium: 1.7 mg/dL (ref 1.7–2.4)

## 2016-12-06 MED ORDER — CARVEDILOL 3.125 MG PO TABS: 3.1250 mg | ORAL_TABLET | Freq: Two times a day (BID) | ORAL | 0 refills | Status: AC

## 2016-12-06 MED ORDER — RIVAROXABAN (XARELTO) EDUCATION KIT FOR DVT/PE PATIENTS
PACK | Freq: Once | Status: AC
  Administered 2016-12-06: 19:00:00
  Filled 2016-12-06: qty 1

## 2016-12-06 MED ORDER — RIVAROXABAN 20 MG PO TABS: 20.0000 mg | ORAL_TABLET | Freq: Every day | ORAL | 0 refills | Status: AC

## 2016-12-06 MED ORDER — BUPROPION HCL ER (XL) 150 MG PO TB24
150.0000 mg | ORAL_TABLET | Freq: Every day | ORAL | Status: DC
  Administered 2016-12-06 – 2016-12-07 (×2): 150 mg via ORAL
  Filled 2016-12-06 (×2): qty 1

## 2016-12-06 MED ORDER — BUPROPION HCL 100 MG PO TABS
100.0000 mg | ORAL_TABLET | Freq: Every day | ORAL | Status: DC
  Filled 2016-12-06: qty 1

## 2016-12-06 MED ORDER — POTASSIUM CHLORIDE CRYS ER 20 MEQ PO TBCR: 40.0000 meq | EXTENDED_RELEASE_TABLET | Freq: Every day | ORAL | 0 refills | Status: DC

## 2016-12-06 MED ORDER — ARIPIPRAZOLE 5 MG PO TABS
5.0000 mg | ORAL_TABLET | Freq: Every day | ORAL | Status: DC
  Administered 2016-12-06 – 2016-12-07 (×2): 5 mg via ORAL
  Filled 2016-12-06 (×2): qty 1

## 2016-12-06 MED ORDER — FUROSEMIDE 40 MG PO TABS
40.0000 mg | ORAL_TABLET | Freq: Two times a day (BID) | ORAL | Status: DC
  Administered 2016-12-06 – 2016-12-07 (×3): 40 mg via ORAL
  Filled 2016-12-06 (×3): qty 1

## 2016-12-06 MED ORDER — POTASSIUM CHLORIDE CRYS ER 20 MEQ PO TBCR
40.0000 meq | EXTENDED_RELEASE_TABLET | Freq: Every day | ORAL | Status: DC
  Administered 2016-12-06 – 2016-12-07 (×2): 40 meq via ORAL
  Filled 2016-12-06 (×2): qty 2

## 2016-12-06 MED ORDER — SERTRALINE HCL 100 MG PO TABS
100.0000 mg | ORAL_TABLET | Freq: Every day | ORAL | Status: DC
  Administered 2016-12-06: 100 mg via ORAL
  Filled 2016-12-06: qty 1

## 2016-12-06 MED ORDER — MAGNESIUM SULFATE 2 GM/50ML IV SOLN
2.0000 g | Freq: Once | INTRAVENOUS | Status: AC
  Administered 2016-12-06: 2 g via INTRAVENOUS
  Filled 2016-12-06: qty 50

## 2016-12-06 MED ORDER — RIVAROXABAN 15 MG PO TABS: 15.0000 mg | ORAL_TABLET | Freq: Two times a day (BID) | ORAL | 0 refills | Status: DC

## 2016-12-06 MED ORDER — BUPROPION HCL ER (XL) 150 MG PO TB24: 150.0000 mg | ORAL_TABLET | Freq: Every day | ORAL | 0 refills | Status: AC

## 2016-12-06 NOTE — Progress Notes (Signed)
Subjective: [name] was seen laying comfortably in bed this morning in no acute distress. His [place] was in place without supplemental oxygen turned on during interview. The patient states his breathing and lower extremity swelling is much improved. He worked with PT/OT yesterday and continues to endorse SOB with exertion and overall weakness. He endorses panic attacks, which he states occur mainly while laying down at night. He describes these attacks as "feeling like he is going to stop breathing". He continues to endorse depressed mood and wants to restart taking medications for his mood/anxiety.   Objective:  Vital signs in last 24 hours: Vitals:   12/06/16 0000 12/06/16 0447 12/06/16 0600 12/06/16 0741  BP: 120/70 100/61 100/61 105/67  Pulse: 98 82 84 83  Resp:  18  18  Temp:  98.1 F (36.7 C)  98.1 F (36.7 C)  TempSrc:  Oral  Oral  SpO2:  100%  94%  Weight:  154 lb 11.2 oz (70.2 kg)    Height:       Physical Exam  Constitutional: He appears well-developed and well-nourished. No distress.  Neck: No JVD present.  Cardiovascular: Normal rate and regular rhythm.   No murmur heard. 2+ radial pulses bilaterally; intact posterior tibial pulses bilaterally. Distal lower extremities warm and well perfused without signs of palor.  Pulmonary/Chest: Effort normal. No respiratory distress. He has no wheezes.  No crackles appreicated  Abdominal: Soft. Bowel sounds are normal. He exhibits no distension. There is no tenderness.  Musculoskeletal: He exhibits no edema (of bilateral lower extremities, significantly improved from yesterday's trace pedal edema around ankles) or tenderness (of bilateral lower extremities).  Skin: Skin is warm and dry. No rash noted. No erythema.   Assessment/Plan:  Principal Problem:   Acute exacerbation of CHF (congestive heart failure) (HCC) Active Problems:   H/O noncompliance with medical treatment, presenting hazards to health   Chronic systolic CHF  (congestive heart failure) (HCC)   Myocardial ischemia   Pulmonary embolus (HCC)   Cocaine abuse  [name] is a 61 yo with a PMH of CHF (EF =10-15%), nonobstructive CAD, nonischemic cardiomyopathy, polysubstance abuse, depression and anxiety who presented to the ED with an acute onset of substernal, non-radiating chest pain. He was found to be hypoxic, tachycardic, and tachypneic on presentation. He was admitted to the internal medicine teaching service for management. The specific problems addressed on his admission are as follows.  Acute exacerbation of chronic heart failure with reduced EF (4/18 echo EF = 10-15%): The patient was hospitalized with a similar presentation of progressive shortness of breath on 08/05/2016. At that time the patient had not taken his medications. His breathing continues to improve with IV diuresis and the patient was able to wean completely to RA last night. He continues to endorse SOB with exertion, which is worse than his baseline. The patient will be started on oral diuretics today and his Cr, which has slowly increased with IV diuresis from 0.8 to 1.15, will be monitored on this regimen. We would expect improvement in Cr to baseline with change from IV to oral regimen. Will continue to monitor.  -CHF consulted, appreciate recommendations -Daily weights while inpatient -Start PO lasix 40 mg BID -Continue home digoxin 0.125 mg , IMDUR 30 mg , Entresto 24-26 mg BID, aspirin 81 mg, and atorvastatin 20 mg -BMP in AM to assess kidney function -Will follow up with Paramedicine team after discharge as patient was previously established with this service   Small bilateral  lower lobe subsegmental pulmonary emboli: The patient's CT angio showed small, subsegmental bilateral pulmonary emboli. We believe that his SOB and increased oxygen requirement was likely mainly due to CHF exacerbation, as his breathing improved greatly with IV diuresis and he is now stable on RA.  Nevertheless these pulmonary emboli may have contributed to the patient's SOB on presentation. He will continue Xarelto 15 mg BID (loading dose) until 12/26/2016 when he will switch to Xarelto 20 mg qD (maintenance dose). He will need this regimen for at least 6 months, given the unprovoked nature of VTE, but likely indefinitely as his risk factors for developing PE, including venous stasis 2/2 HF, will not change.   Hx of substance abuse, including alcohol and cocaine:Alcohol on admission <5. UDS + for cocaine.  -CIWA protocol for monitoring of alcohol withdraw symptoms, hold Ativan as much as possible for decreased respiratory drive -Nicotine Patch 7 mg TD  Hx of insulin dependent diabetes: Patient on metformin 1000 mg BID at home. Glucose on admission 202. Previously took 40 units Lantus at home, will monitor glucose throughout admission. Sugars have been high while inpatient (207) and insulin will be adjusted accordingly.  -20 units Lantus qNightly -SSI with meals  Hx of depression/anxiety: Patient continues to endorse depressed mood and anxiety, which occurs mostly when laying down at night. He describes anxiety attacks as an overwhelming feeling where he cannot catch his breath while laying down, which is consistent with orthopnea 2/2 CHF. We are reluctant to start home Ativan for anxiety, given that it can cause respiratory depression and his anxiety is mainly caused by SOB. Will restart other mood stabilizing agents now that his respiratory status is less tenuous.  -Home sertraline 100 mg daily, buproprion XL 150 mg daily, and aripiprazole 5 mg daily.  FEN/GI: -Heart healthy diet with fluid restriction -Mg replacement  -K-Dur 40 mEq daily  Dispo: Anticipated discharge in approximately 1-2 day(s). PT/OT recommendations for SNF level of care. Patient agreeable and working with social work towards disposition to facility.  Thomas Lesches, MD 12/06/2016, 10:34 AM Pager: [phone]

## 2016-12-06 NOTE — Progress Notes (Signed)
Subjective: Mr. Iocco was seen lying in bed comfortably this morning. He reports feeling "much better" compared to yesterday. He does not feel as short of breath as he did two days ago, and he feels like he has been able to walk around with more ease. He states that he can walk to the door and back before he feels short of breath, but this is still significantly worse than his baseline. He feels that his lower extremity edema has decreased over the past day. He states that he has felt anxious while being in the hospital. He describes that when he feels anxious, he feels like he "won't ever breathe again" and that his chest is closing in on him. He states that he normally takes Zoloft for this at home and it helps. He describes speaking with social work yesterday about his disposition and the possibility of going to a SNF when he leaves the hospital. He says that he is amenable to this plan.  Objective: Vital signs in last 24 hours: Vitals:   12/06/16 0000 12/06/16 0447 12/06/16 0600 12/06/16 0741  BP: 120/70 100/61 100/61 105/67  Pulse: 98 82 84 83  Resp:  18  18  Temp:  98.1 F (36.7 C)  98.1 F (36.7 C)  TempSrc:  Oral  Oral  SpO2:  100%  94%  Weight:  70.2 kg (154 lb 11.2 oz)    Height:       Weight change: -0.321 kg (-11.3 oz)  Intake/Output Summary (Last 24 hours) at 12/06/16 1000 Last data filed at 12/06/16 1916  Gross per 24 hour  Intake              600 ml  Output             1400 ml  Net             -800 ml   General appearance: alert, cooperative and no distress Lungs: mild wheezes, improved from yesterday Heart: regular rate and rhythm, S1, S2 normal, no murmur, click, rub or gallop Abdomen: soft, non-tender; bowel sounds normal; no masses,  no organomegaly Extremities: edema in left ankle, decreased from yesterday Lab Results: CBC normal BMP: Na+ 135, K+ 3.4, Cl- 104, CO2 24, glucose 87, BUN 19, Cr 1.15, Ca2+ 7.6 Mg2+: 1.7 (up from 1.4 yesterday)  Micro  Results: Recent Results (from the past 240 hour(s))  MRSA PCR Screening     Status: None   Collection Time: 12/04/16  3:58 PM  Result Value Ref Range Status   MRSA by PCR NEGATIVE NEGATIVE Final    Comment:        The GeneXpert MRSA Assay (FDA approved for NASAL specimens only), is one component of a comprehensive MRSA colonization surveillance program. It is not intended to diagnose MRSA infection nor to guide or monitor treatment for MRSA infections.    Studies/Results: No results found. Medications: I have reviewed the patient's current medications. Scheduled Meds: . ARIPiprazole  5 mg Oral Daily  . aspirin  81 mg Oral Daily  . atorvastatin  20 mg Oral q1800  . buPROPion  150 mg Oral Daily  . digoxin  0.125 mg Oral Daily  . folic acid  1 mg Oral Daily  . furosemide  40 mg Oral BID  . hydrALAZINE  25 mg Oral TID  . insulin aspart  0-9 Units Subcutaneous TID WC  . insulin glargine  20 Units Subcutaneous QHS  . isosorbide mononitrate  30 mg Oral Daily  .  multivitamin with minerals  1 tablet Oral Daily  . nicotine  7 mg Transdermal Daily  . potassium chloride  40 mEq Oral Daily  . Rivaroxaban  15 mg Oral BID WC  . [START ON 12/26/2016] rivaroxaban  20 mg Oral Q supper  . sacubitril-valsartan  1 tablet Oral BID  . sertraline  100 mg Oral QHS  . sodium chloride flush  3 mL Intravenous Q12H  . thiamine  100 mg Oral Daily   Or  . thiamine  100 mg Intravenous Daily   Continuous Infusions: . sodium chloride    . magnesium sulfate 1 - 4 g bolus IVPB 2 g (12/06/16 0948)   PRN Meds:.sodium chloride, acetaminophen, nitroGLYCERIN, ondansetron (ZOFRAN) IV, sodium chloride flush Assessment/Plan: Principal Problem:   Acute exacerbation of CHF (congestive heart failure) (HCC) Active Problems:   H/O noncompliance with medical treatment, presenting hazards to health   Chronic systolic CHF (congestive heart failure) (HCC)   Myocardial ischemia   Pulmonary embolus (HCC)   Cocaine  abuse  Mr. Coven is a 61 year old with a past medical history of CHF, HTN, CAD, type II DM and alcohol and cocaine abuse who presents due to chest pain in the setting of volume overload.  HFrEF exacerbation: Mr. Ryback has responded well symptomatically to the diuretics given to him over the past two days, and he has had a net negative of about 2.4L since admission. His weight today (154.75 lbs) is about 3 lbs down from his presenting weight. His exam this morning shows decreased peripheral edema and decreased crackles in his lungs along with warm extremities. This, along with his symptomatic improvement, shows that his diuresis has continued to be helpful. He has experienced a bump in creatinine from 0.88 on admission to 1.15 today so his IV lasix 40 mg BID was decreased to PO lasix 40 mg BID. We will continue to monitor his BMP's for signs of kidney injury. From a medical standpoint, we believe he is at or close to his dry weight and is cleared to be discharged. Social work has seen him and has recommended that he go to a SNF on discharge, a plan he is amenable to. We will work with social work to set this up. - continue PO lasix 40 mg BID - continue home  heart failure meds (Imdur 30 mg qd, K-Dur 20 mEq qd, Entresto 24-26 mg, asa 81 mg qd, atorvastatin 20 mg qd - work with social work for placement to [employer] bilateral lower lobe pulmonary emboli: He has had no other chest pain or shortness of breath since admission. Yesterday, he was transitioned from a heparin drip to PO Xarelto for anticoagulation. His shortness of breath has responded well to the diuresis, so a CHF exacerbation is more likely to be the cause of his presentation than the pulmonary emboli. - continue Xarelto 20 mg qd - pharmacy consulted, appreciate their recommendations  Substance abuse: UDS positive for cocaine. Blood alcohol <5 on admission. He admitted to using both on Saturday. No signs of alcohol withdrawal so  far. Mentation seems better compared to day of admission. - CIWA protocol to monitor for alcohol withdrawal - Nicotine patch 7 mg - we will speak with him about the dangers of substance abuse  Type II DM: He takes metformin 1000 mg BID at home. Glucose on admission 202. Most recent A1c was 9.6 on 08/05/16. His blood sugars have been well controlled since admission. - continue Lantus 20 units and SSI  Depression/anxiety:  We will continue to monitor his mental status and mood. He states that he has felt more anxious since we discontinued his home anxiolytics, which include Zoloft. We plan to restart this medication along with bupropion to combat his anxiety and depression. - restart Zoloft 100 mg qd - begin Wellbutrin 150 mg qd  This is a Psychologist, occupational Note.  The care of the patient was discussed with Dr. Saunders Revel and the assessment and plan formulated with their assistance.  Please see their attached note for official documentation of the daily encounter.   LOS: 2 days   Shon Baton, Medical Student 12/06/2016, 10:00 AM

## 2016-12-06 NOTE — Care Management Note (Signed)
Case Management Note  Patient Details  Name: [name] MRN: [id number] Date of Birth: 1955/10/24  Subjective/Objective:    CHF, noncompliance with medical treatment, MI, Pulm Emboli, cocaine use            Action/Plan: Discharge Planning: Chart reviewed. CSW following for SNF placement. Scheduled dc to SNF today.   PCP [facility]   Expected Discharge Date:  12/06/16               Expected Discharge Plan:  Skilled Nursing Facility  In-House Referral:  Clinical Social Work  Discharge planning Services  CM Consult  Post Acute Care Choice:  NA Choice offered to:  NA  DME Arranged:  N/A DME Agency:  NA  HH Arranged:  NA HH Agency:  NA  Status of Service:  Completed, signed off  If discussed at [employer] of Stay Meetings, dates discussed:    Additional Comments:  Elliot Cousin, RN 12/06/2016, 3:39 PM

## 2016-12-06 NOTE — Discharge Instructions (Addendum)

## 2016-12-06 NOTE — Progress Notes (Signed)
Advanced Heart Failure Rounding Note  PCP: [facility].  Primary Cardiologist: Dr. Shirlee Latch   Subjective:    Admitted 12/04/16 with SOB, volume overload. Had not taken his medications for 3 days, + cocaine.   Weight down 2 pounds overnight, brisk diuresis yesterday. Feels better, SOB improved. Denies orthopnea.   Objective:   Weight Range: 154 lb 11.2 oz (70.2 kg) Body mass index is 19.86 kg/m.   Vital Signs:   Temp:  [97.7 F (36.5 C)-98.5 F (36.9 C)] 98.1 F (36.7 C) (08/14 0741) Pulse Rate:  [82-112] 83 (08/14 0741) Resp:  [18-34] 18 (08/14 0741) BP: (100-131)/(61-76) 105/67 (08/14 0741) SpO2:  [94 %-100 %] 94 % (08/14 0741) Weight:  [154 lb 11.2 oz (70.2 kg)-156 lb 11.2 oz (71.1 kg)] 154 lb 11.2 oz (70.2 kg) (08/14 0447) Last BM Date: 12/04/16  Weight change: Filed Weights   12/05/16 0328 12/05/16 1816 12/06/16 0447  Weight: 160 lb 15 oz (73 kg) 156 lb 11.2 oz (71.1 kg) 154 lb 11.2 oz (70.2 kg)    Intake/Output:   Intake/Output Summary (Last 24 hours) at 12/06/16 0819 Last data filed at 12/06/16 0600  Gross per 24 hour  Intake              240 ml  Output             1675 ml  Net            -1435 ml      Physical Exam    General: Well appearing. No resp difficulty. HEENT: Normal Neck: Supple. JVP 6-7 cm. Carotids 2+ bilat; no bruits. No thyromegaly or nodule noted. Cor: PMI nondisplaced. RRR. 2/6 SEM at apex.  Lungs: CTAB, normal effort. Abdomen: Soft, non-tender, non-distended, no HSM. No bruits or masses. +BS  Extremities: No cyanosis, clubbing, rash. Trace pedal edema.  Neuro: Alert & orientedx3, cranial nerves grossly intact. moves all 4 extremities w/o difficulty. Affect pleasant   Telemetry   NSR - personally reviewed.   EKG    Sinus tach - personally reviewed.   Labs    CBC  Recent Labs  12/04/16 0609 12/05/16 0226  WBC 5.3 9.6  HGB 15.1 14.9  HCT 42.6 42.4  MCV 86.8 85.5  PLT 282 260   Basic Metabolic  Panel  Recent Labs  16/10/96 1200 12/05/16 0226 12/06/16 0336  NA  --  132* 135  K  --  3.9 3.4*  CL  --  101 104  CO2  --  19* 24  GLUCOSE  --  148* 87  BUN  --  13 19  CREATININE  --  1.07 1.15  CALCIUM  --  7.9* 7.6*  MG 1.4*  --  1.7   Liver Function Tests  Recent Labs  12/04/16 1120 12/05/16 0226  AST 61* 33  ALT 29 23  ALKPHOS 101 87  BILITOT 1.1 1.5*  PROT 7.4 6.4*  ALBUMIN 2.7* 2.3*   No results for input(s): LIPASE, AMYLASE in the last 72 hours. Cardiac Enzymes  Recent Labs  12/04/16 1120 12/04/16 1644 12/04/16 2221  TROPONINI 0.04* 0.09* 0.06*    BNP: BNP (last 3 results)  Recent Labs  09/21/16 1500 10/19/16 1408 12/04/16 0609  BNP 1,292.9* 433.4* 1,958.9*    ProBNP (last 3 results) No results for input(s): PROBNP in the last 8760 hours.   D-Dimer  Recent Labs  12/04/16 0609  DDIMER 1.44*    Imaging    No results found.  Medications:     Scheduled Medications: . aspirin  81 mg Oral Daily  . atorvastatin  20 mg Oral q1800  . digoxin  0.125 mg Oral Daily  . folic acid  1 mg Oral Daily  . furosemide  40 mg Oral BID  . hydrALAZINE  25 mg Oral TID  . insulin aspart  0-9 Units Subcutaneous TID WC  . insulin glargine  20 Units Subcutaneous QHS  . isosorbide mononitrate  30 mg Oral Daily  . multivitamin with minerals  1 tablet Oral Daily  . nicotine  7 mg Transdermal Daily  . potassium chloride  40 mEq Oral Daily  . Rivaroxaban  15 mg Oral BID WC  . [START ON 12/26/2016] rivaroxaban  20 mg Oral Q supper  . sacubitril-valsartan  1 tablet Oral BID  . sodium chloride flush  3 mL Intravenous Q12H  . thiamine  100 mg Oral Daily   Or  . thiamine  100 mg Intravenous Daily    Infusions: . sodium chloride    . magnesium sulfate 1 - 4 g bolus IVPB      PRN Medications: sodium chloride, acetaminophen, nitroGLYCERIN, ondansetron (ZOFRAN) IV, sodium chloride flush    Patient Profile   CICERO WICKES is a 61 y.o. male  with h/o of NICM, Combined CHF, HTN, DM, CAD, ETOH abuse, and cocaine abuse.   Assessment/Plan   1. Acute on chronic systolic CHF: NICM, EF 10-15% in 07/2016. Related to HTN, cocaine and ETOH abuse - NYHA II-III - Volume status improving. Start lasix 40 mg BID today.  - Continue dig 0.125 mcg.  - Continue Entresto 24/26 mg BID - Continue hydralazine 25 mg TID - Continue isosorbide 30 mg daily.  - No beta blocker with ongoing cocaine use - Referred back to paramedicine.   2. CAD - Mild disease on cath in 2014 - Denies chest pain - Continue daily ASA - No change.   3. ETOH and cocaine abuse - homeless, social work helping with housing.   4. Non compliance - Encouraged compliance with medications.   5. VTE: Small bilateral PEs on CTA.   - Now on Xarelto, coumadin not the best option with noncompliance.    Length of Stay: 2  Little Ishikawa, NP  12/06/2016, 8:19 AM  Advanced Heart Failure Team Pager [phone] (M-F; 7a - 4p)  Please contact [facility] for night-coverage after hours (4p -7a ) and weekends on [email or web address]  Patient seen with NP, agree with the above note.  He will be going to SNF, so ok to start Coreg 3.125 mg bid.  Switch to po Lasix.  Will be on Xarelto for PEs.    OK for discharge to SNF today.   Marca Ancona 12/06/2016

## 2016-12-06 NOTE — NC FL2 (Signed)
[place] MEDICAID FL2 LEVEL OF CARE SCREENING TOOL     IDENTIFICATION  Patient Name: [name] Birthdate: [birth year] Sex: male Admission Date (Current Location): 12/04/2016  [facility] and [place] Number:  Producer, television/film/video and Address:  [facility], [address] [place], [place] [address]      Provider Number: [id number]  Attending Physician Name and Address:  Doneen Poisson, MD  Relative Name and Phone Number:       Current Level of Care: Hospital Recommended Level of Care: Skilled Nursing Facility Prior Approval Number:    Date Approved/Denied:   [name] Number: Manual review  Discharge Plan: SNF    Current Diagnoses: Patient Active Problem List   Diagnosis Date Noted  . Myocardial ischemia   . Pulmonary embolus (HCC)   . Cocaine abuse   . Acute exacerbation of CHF (congestive heart failure) (HCC) 12/04/2016  . H/O noncompliance with medical treatment, presenting hazards to health 12/04/2016  . Chronic systolic CHF (congestive heart failure) (HCC) 12/04/2016  . Anxiety state   . Depression with anxiety 08/05/2016  . DNR (do not resuscitate) discussion   . Palliative care encounter   . Polysubstance abuse   . Acute on chronic combined systolic and diastolic heart failure (HCC) 09/07/2015  . Type 2 diabetes mellitus with complication (HCC)   . Hypertensive heart disease with chronic systolic congestive heart failure (HCC)   . 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 Alcohol abuse 12/21/2012  . Cocaine dependence (HCC) 12/21/2012  . Substance induced mood disorder (HCC) 12/21/2012  . Coronary artery disease 08/14/2012  . Cardiomyopathy, nonischemic (HCC) 08/14/2012    Orientation RESPIRATION BLADDER Height & Weight     Self, Time, Situation, Place  O2 (Nasal Canula 2 L) Continent Weight: 154 lb 11.2 oz (70.2 kg) (scale c) Height:  6\' 2"  (188 cm)  BEHAVIORAL SYMPTOMS/MOOD NEUROLOGICAL BOWEL NUTRITION STATUS   (Calm,  Cooperative)  (None) Continent Diet (Heart healthy/carb modified. Fluid restriction 1200 mL.)  AMBULATORY STATUS COMMUNICATION OF NEEDS Skin   Limited Assist Verbally Normal                       Personal Care Assistance Level of Assistance  Bathing, Feeding, Dressing Bathing Assistance: Limited assistance Feeding assistance: Independent Dressing Assistance: Limited assistance     Functional Limitations Info  Sight, Hearing, Speech Sight Info: Adequate Hearing Info: Adequate Speech Info: Adequate    SPECIAL CARE FACTORS FREQUENCY  PT (By licensed PT), OT (By licensed OT), Blood pressure     PT Frequency: 5 x week OT Frequency: 5 x week            Contractures Contractures Info: Not present    Additional Factors Info  Code Status, Allergies, Psychotropic Code Status Info: Full Allergies Info: NKDA Psychotropic Info: Anxiety, Depression, Substance-induced Mood Disorder: Abilify 5 mg PO daily, Wellbutrin XL 150 mg PO daily, Zoloft 100 mg PO QHS.         Current Medications (12/06/2016):  This is the current hospital active medication list Current Facility-Administered Medications  Medication Dose Route Frequency Provider Last Rate Last Dose  . 0.9 %  sodium chloride infusion  250 mL Intravenous PRN Valentino Nose, MD      . acetaminophen (TYLENOL) tablet 650 mg  650 mg Oral Q4H PRN Valentino Nose, MD      . ARIPiprazole (ABILIFY) tablet 5 mg  5 mg Oral Daily Nedrud, Jeanella Flattery, MD   5 mg  at 12/06/16 0949  . aspirin chewable tablet 81 mg  81 mg Oral Daily Valentino Nose, MD   81 mg at 12/06/16 0949  . atorvastatin (LIPITOR) tablet 20 mg  20 mg Oral q1800 Valentino Nose, MD   20 mg at 12/05/16 1756  . buPROPion (WELLBUTRIN XL) 24 hr tablet 150 mg  150 mg Oral Daily Nedrud, Marybeth, MD      . digoxin (LANOXIN) tablet 0.125 mg  0.125 mg Oral Daily Nedrud, Marybeth, MD   0.125 mg at 12/06/16 0949  . folic acid (FOLVITE) tablet 1 mg  1 mg Oral Daily Darreld Mclean, MD    1 mg at 12/06/16 0950  . furosemide (LASIX) tablet 40 mg  40 mg Oral BID Valentino Nose, MD   40 mg at 12/06/16 0949  . hydrALAZINE (APRESOLINE) tablet 25 mg  25 mg Oral TID Othella Boyer, MD   25 mg at 12/06/16 0950  . insulin aspart (novoLOG) injection 0-9 Units  0-9 Units Subcutaneous TID WC Valentino Nose, MD   3 Units at 12/06/16 0800  . insulin glargine (LANTUS) injection 20 Units  20 Units Subcutaneous QHS Valentino Nose, MD   20 Units at 12/05/16 2148  . isosorbide mononitrate (IMDUR) 24 hr tablet 30 mg  30 mg Oral Daily Valentino Nose, MD   30 mg at 12/06/16 0949  . magnesium sulfate IVPB 2 g 50 mL  2 g Intravenous Once Valentino Nose, MD 50 mL/hr at 12/06/16 0948 2 g at 12/06/16 0948  . multivitamin with minerals tablet 1 tablet  1 tablet Oral Daily Darreld Mclean, MD   1 tablet at 12/06/16 0949  . nicotine (NICODERM CQ - dosed in mg/24 hr) patch 7 mg  7 mg Transdermal Daily Valentino Nose, MD   7 mg at 12/06/16 0949  . nitroGLYCERIN (NITROSTAT) SL tablet 0.4 mg  0.4 mg Sublingual Q5 min PRN Phillis Haggis, MD   0.4 mg at 12/04/16 0720  . ondansetron (ZOFRAN) injection 4 mg  4 mg Intravenous Q6H PRN Valentino Nose, MD      . potassium chloride SA (K-DUR,KLOR-CON) CR tablet 40 mEq  40 mEq Oral Daily Valentino Nose, MD   40 mEq at 12/06/16 0949  . Rivaroxaban (XARELTO) tablet 15 mg  15 mg Oral BID WC Earnie Larsson, RPH   15 mg at 12/06/16 1610  . [START ON 12/26/2016] rivaroxaban (XARELTO) tablet 20 mg  20 mg Oral Q supper Earnie Larsson, RPH      . sacubitril-valsartan (ENTRESTO) 24-26 mg per tablet  1 tablet Oral BID Valentino Nose, MD   1 tablet at 12/06/16 0956  . sertraline (ZOLOFT) tablet 100 mg  100 mg Oral QHS Nedrud, Marybeth, MD      . sodium chloride flush (NS) 0.9 % injection 3 mL  3 mL Intravenous Q12H Valentino Nose, MD   3 mL at 12/06/16 0950  . sodium chloride flush (NS) 0.9 % injection 3 mL  3 mL Intravenous PRN Valentino Nose, MD      . thiamine (VITAMIN  B-1) tablet 100 mg  100 mg Oral Daily Darreld Mclean, MD   100 mg at 12/06/16 0950   Or  . thiamine (B-1) injection 100 mg  100 mg Intravenous Daily Darreld Mclean, MD         Discharge Medications: Please see discharge summary for a list of discharge medications.  Relevant Imaging Results:  Relevant Lab Results:   Additional Information SS#: [id number]. Homeless for the past  few weeks.  Margarito Liner, LCSW

## 2016-12-06 NOTE — Progress Notes (Signed)
Discussed with HF team. OK with discharge today. Would like to start Coreg 3.125 mg bid. Will add to his regimen.

## 2016-12-06 NOTE — Clinical Social Work Note (Addendum)
Patient's PASARR is still pending. Patient cannot go to SNF until PASARR obtained.  Charlynn Court, CSW [phone]  4:28 pm PASARR still pending. His only bed offer is [facility]. Patient is aware and is agreeable to this facility. His friend plans to bring him clothes at 2:00 pm tomorrow.  Charlynn Court, CSW [phone]

## 2016-12-06 NOTE — Progress Notes (Signed)
Internal Medicine Attending  Date: 12/06/2016  Patient name: [name] Medical record number: [id number] Date of birth: August 17, 1955 Age: 61 y.o. Gender: male  I saw and evaluated the patient. I reviewed the resident's note by Dr. Saunders Revel and I agree with the resident's findings and plans as documented in her progress note.  When seen on rounds this morning [name] noted improvement in his dyspnea over the last 24 hours. He lost 6 pounds over the last 24 hours with intensive diuresis. He's had a slight increase in his BUN and creatinine over yesterday and therefore we feel he is close to his dry weight at 154 pounds. He's been converted to an oral loop diuretic regimen and is stable for discharge to the skilled nursing facility on his CHF regimen, anti-anxiety regimen, and anticoagulation for his PEs.

## 2016-12-06 NOTE — Progress Notes (Signed)
Pt called RN per having SOB, SPO2 was 98% in RA, RN maintained the position of the bed and given oxygen via [place] @2l /min just for the comfort, Pt felt better, no any other complain of CP, will continue to monitor

## 2016-12-06 NOTE — Discharge Summary (Addendum)
Name: [name] MRN: [id number] DOB: 1955/07/09 61 y.o. PCP: [facility]  Date of Admission: 12/04/2016  6:23 AM Date of Discharge: 12/07/2016 Attending Physician: Oval Linsey, MD  Discharge Diagnosis:  Principal Problem:   Acute exacerbation of CHF (congestive heart failure) ([place]) Active Problems:   H/O noncompliance with medical treatment, presenting hazards to health   Chronic systolic CHF (congestive heart failure) ([place])   Myocardial ischemia   Pulmonary embolus (HCC)   Cocaine abuse   Discharge Medications: Allergies as of 12/07/2016   No Known Allergies     Medication List    STOP taking these medications   ALPRAZolam 1 MG tablet Commonly known as:  XANAX   buPROPion 100 MG tablet Commonly known as:  WELLBUTRIN Replaced by:  buPROPion 150 MG 24 hr tablet   losartan 50 MG tablet Commonly known as:  COZAAR     TAKE these medications   acetaminophen 325 MG tablet Commonly known as:  TYLENOL Take 650 mg by mouth every 6 (six) hours as needed for mild pain.   ARIPiprazole 10 MG tablet Commonly known as:  ABILIFY Take 5 mg by mouth daily.   aspirin 81 MG chewable tablet Chew 1 tablet (81 mg total) by mouth daily.   atorvastatin 20 MG tablet Commonly known as:  LIPITOR Take 1 tablet (20 mg total) by mouth daily at 6 PM.   buPROPion 150 MG 24 hr tablet Commonly known as:  WELLBUTRIN XL Take 1 tablet (150 mg total) by mouth daily. Replaces:  buPROPion 100 MG tablet   carvedilol 3.125 MG tablet Commonly known as:  COREG Take 1 tablet (3.125 mg total) by mouth 2 (two) times daily.   digoxin 0.125 MG tablet Commonly known as:  LANOXIN Take 1 tablet (0.125 mg total) by mouth daily.   furosemide 40 MG tablet Commonly known as:  LASIX Take 40 mg by mouth 2 (two) times daily.   hydrALAZINE 25 MG tablet Commonly known as:  APRESOLINE Take 1 tablet (25 mg total) by mouth 3 (three) times daily.   insulin glargine 100 UNIT/ML  injection Commonly known as:  LANTUS Inject 20 units into skin each night. Check BG daily. If CBG>300 can take 40 units. What changed:  how much to take  how to take this  when to take this  reasons to take this  additional instructions   isosorbide mononitrate 30 MG 24 hr tablet Commonly known as:  IMDUR Take 1 tablet (30 mg total) by mouth daily.   metFORMIN 1000 MG tablet Commonly known as:  GLUCOPHAGE Take 1 tablet (1,000 mg total) by mouth 2 (two) times daily with a meal. For high blood sugar control   potassium chloride SA 20 MEQ tablet Commonly known as:  K-DUR,KLOR-CON Take 2 tablets (40 mEq total) by mouth daily. What changed:  how much to take   Rivaroxaban 15 MG Tabs tablet Commonly known as:  XARELTO Take 1 tablet (15 mg total) by mouth 2 (two) times daily with a meal.   rivaroxaban 20 MG Tabs tablet Commonly known as:  XARELTO Take 1 tablet (20 mg total) by mouth daily with supper. Start taking on:  12/26/2016   sacubitril-valsartan 24-26 MG Commonly known as:  ENTRESTO Take 1 tablet by mouth 2 (two) times daily.   sertraline 100 MG tablet Commonly known as:  ZOLOFT Take 100 mg by mouth at bedtime.   traZODone 100 MG tablet Commonly known as:  DESYREL Take 1 tablet (100 mg total)  by mouth at bedtime. For sleep       Disposition and follow-up:   ThomasLadarrell W [name] was discharged from [facility] in Good condition.  At the hospital follow up visit please address:  1.  The patient was diagnosed with CHF exacerbation and small, bilateral subsegmental pulmonary embolisms. He ran out of his home medications leading up to admission, which likely contributed to acute decompensation of CHF. He improved with IV diuresis and was stable on home CHF regimen at the time of discharge. He was started on Xarelto, 15 mg BID for treatment of PE and will switch to maintenance dose of 20 mg daily on 12/26/2016.   Please follow up to determine if this  patient would be a candidate for ICD placement, as his EF is 10-15% on echocardiogram. Re-started HF medications. Please assess for compliance with medications and access to medications.   2.  Labs / imaging needed at time of follow-up: BMP to assess kidney function and electrolyte levels on oral lasix 40 mg BID.  3.  Pending labs/ test needing follow-up: None  Follow-up Appointments: Follow-up Information    [facility] Follow up on 12/21/2016.   Specialty:  Cardiology Why:  at 2:00 pm. [place] code 7001.  Contact information: [address] [id number] [place]. Schedule an appointment as soon as possible for a visit.   Specialty:  General Practice Contact information: [place] [address] [phone]           Hospital Course by problem list: Principal Problem:   Acute exacerbation of CHF (congestive heart failure) ([place]) Active Problems:   H/O noncompliance with medical treatment, presenting hazards to health   Chronic systolic CHF (congestive heart failure) ([place])   Myocardial ischemia   Pulmonary embolus (HCC)   Cocaine abuse   [name] is a 61 yo with a PMH of CHF (EF =10-15%), nonobstructive CAD, nonischemic cardiomyopathy, polysubstance abuse, depression and anxiety who presented to the ED with an acute onset of substernal, non-radiating chest pain concerning for ACS. His ACS workup was negative, but he was found to be hypoxic, tachycardic, and tachypneic on admission to the internal medicine teaching service for management. The specific problems addressed on his admission are as follows.  Acute exacerbation of chronic heart failure with reduced EF (most recent echo on 07/2016 showed EF = 10-15%): The patient was hospitalized with a similar presentation of progressive shortness of breath on 08/05/2016. At that time the  patient had not taken his medications and he was discharged with close follow up by the Paramedicine team from the CHF clinic. This was going well, but the patient fell out of contact a few weeks prior to admission. The patient's chest imaging on admission showed bilateral pleural effusions and his BNP was elevated to 1,958. His symptoms, including SOB at rest and with exertion, orthopnea, and lower extremity swelling were consistent with CHF exacerbation. His breathing improved with IV lasix and he was stable on room air prior to being switched to his home oral lasix on HD #3.  He was admitted at 160lbs and had a dry weight of 154lbs after IV diuresis. Clinical symptoms would suggest 154lbs as his dry weight and he was discharged with oral Lasix, 40 mg BID. Please determine if patient would be a candidate for ICD placement on follow up.  Small bilateral lower lobe  subsegmental pulmonary emboli: The patient had worseningSOB and hypoxia on presentation to the ED. He had an elevated D-Dimer (1.44) and a CT angio showed small, subsegmental bilateral pulmonary emboli. His SOB and increased oxygen requirement was thought to be multifactorial, with both the small bilateral PE's and CHF exacerbation contributing to his problem. His SOB resolved with IV diuresis, suggesting that the majority of his symptoms resulted from CHF exacerbation. Nevertheless the patient was started on treatment for his PEs. He was given Xarelto 15 mg BID as a loading dose. He will take his last dose of 15 mg BID on 12/25/2016 and switch to a maintenance dose of 20 mg daily on 12/26/2016.   Hx of depression/anxiety: The patient continued to endorse anxiety and depressed mood during his hospitalization. The patient's home regimen included sertraline, bupropion, and aripiprazole for his mood symptoms. The patient reports use of Xanax TID PRN at home for management of his anxiety. This was held during hospitalization for concern of respiratory  depression. The patient's description of anxiety included a feeling of chest tightness and shortness of breath while laying down at night. This is more consistent with orthopnea 2/2 CHF rather than anxiety attacks. We stopped Xanax TID PRN on discharge.  Hx of substance abuse, including alcohol, tobacco, and cocaine:Alcohol on admission <5 and CIWA protocol was initiated, as patient reported drinking up to 3-4 40 oz beers per day prior to admission. The patient did not require any ativan for symptoms of withdrawal during hospitalization.   Hx of insulin dependent T2DM: The patient reports use of metformin 1046m BID and 40 units Lantus PRN. He was given 20 units of Lantus with sliding scale insulin and glucose monitoring TID during hospitalization. His BG ran high during hospitalization and last A1C >9%. He was told to continue taking 20 units Lantus at night, check blood sugars daily, and to restart metformin 1000 mg BID upon discharge.   Discharge Vitals:   BP 130/78 (BP Location: Right Arm)   Pulse 80   Temp 97.8 F (36.6 C) (Oral)   Resp 18   Ht _0  (1.88 m)   Wt 155 lb 9.6 oz (70.6 kg) Comment: scale c  SpO2 98%   BMI 19.98 kg/m   Pertinent Labs, Studies, and Procedures:   BMP BMP Latest Ref Rng & Units 12/07/2016 12/06/2016 12/05/2016  Glucose 65 - 99 mg/dL 155(H) 87 148(H)  BUN 6 - 20 mg/dL _1 Creatinine 0.61 - 1.24 mg/dL 0.89 1.15 1.07  Sodium 135 - 145 mmol/L 134(L) 135 132(L)  Potassium 3.5 - 5.1 mmol/L 3.7 3.4(L) 3.9  Chloride 101 - 111 mmol/L 104 104 101  CO2 22 - 32 mmol/L 22 24 19(L)  Calcium 8.9 - 10.3 mg/dL 8.0(L) 7.6(L) 7.9(L)   CBC CBC Latest Ref Rng & Units 12/06/2016 12/05/2016 12/04/2016  WBC 4.0 - 10.5 K/uL 4.4 9.6 5.3  Hemoglobin 13.0 - 17.0 g/dL 14.5 14.9 15.1  Hematocrit 39.0 - 52.0 % 40.6 42.4 42.6  Platelets 150 - 400 K/uL 193 260 282   Hepatic Function Panel Hepatic Function Latest Ref Rng & Units 12/05/2016 12/04/2016 08/18/2016  Total Protein  6.5 - 8.1 g/dL 6.4(L) 7.4 -  Albumin 3.5 - 5.0 g/dL 2.3(L) 2.7(L) -  AST 15 - 41 U/L 33 61(H) 34  ALT 17 - 63 U/L 23 29 34  Alk Phosphatase 38 - 126 U/L 87 101 98  Total Bilirubin 0.3 - 1.2 mg/dL 1.5(H) 1.1 -  Bilirubin, Direct 0.1 -  0.5 mg/dL - 0.4 -   D-dimer= 1.44 ISTAT troponin = 0.05 Troponin (q6 hours) = 0.04, 0.09, 0.06 BNP = 1958.9  Digoxin level <0.2  Ethanol <5  Urine rapid drug screen Collected: 12/04/16 1046   Opiates NONE DETECTED   Cocaine POSITIVE (A)   Benzodiazepines NONE DETECTED   Amphetamines NONE DETECTED   Tetrahydrocannabinol NONE DETECTED   Barbiturates NONE DETECTED    Discharge Instructions: Discharge Instructions    Activity as tolerated - No restrictions    Complete by:  As directed    Call MD for:  difficulty breathing, headache or visual disturbances    Complete by:  As directed    Call MD for:  persistant dizziness or light-headedness    Complete by:  As directed    Call MD for:  persistant nausea and vomiting    Complete by:  As directed    Diet - low sodium heart healthy    Complete by:  As directed    Discharge instructions    Complete by:  As directed    Please follow up with your PCP at the [facility] and the paramedicine team/CHF clinic after discharge.   Increase activity slowly    Complete by:  As directed       Signed: Thomasene Ripple, MD 12/07/2016, 12:11 PM   Pager: [phone]

## 2016-12-06 NOTE — Clinical Social Work Note (Signed)
Clinical Social Work Assessment  Patient Details  Name: [name] MRN: [id number] Date of Birth: 01-22-1956  Date of referral:  12/06/16               Reason for consult:  Facility Placement, Discharge Planning                Permission sought to share information with:  Chartered certified accountant granted to share information::  Yes, Verbal Permission Granted  Name::        Agency::  SNF's  Relationship::     Contact Information:     Housing/Transportation Living arrangements for the past 2 months:  Homeless Source of Information:  Patient, Medical Team Patient Interpreter Needed:  None Criminal Activity/Legal Involvement Pertinent to Current Situation/Hospitalization:  No - Comment as needed Significant Relationships:  Siblings, Parents Lives with:  Self Do you feel safe going back to the place where you live?  No Need for family participation in patient care:  Yes (Comment)  Care giving concerns:  PT recommending SNF once medically stable for discharge. Patient is recently homeless, living in his car for the past few weeks.   Social Worker assessment / plan:  CSW met with patient. No supports at bedside. CSW introduced role and explained that PT recommendations would be discussed. Patient is agreeable to SNF placement. No preference on facility, just not [employer]. Patient has two bed offers so far: [facility] and [facility]. Patient is aware. PASARR is pending due to mental health diagnoses. Requested information faxed to [facility] for review. Patient confirmed that he has been homeless for the past few weeks and living in his car. He is agreeable to receiving resources. CSW provided booklets for free meals and food pantries in [place], [place] [facility], local homeless shelters, and low-income housing options in [place]. No further concerns. CSW encouraged patient to contact CSW as needed. CSW will continue to follow  patient for support and facilitate discharge to SNF once PASARR is obtained.  Employment status:  Disabled (Comment on whether or not currently receiving Disability) Insurance information:  Managed Medicare PT Recommendations:  [place] / Referral to community resources:  [place]  Patient/Family's Response to care:  Patient is agreeable to SNF placement and receiving resources. It is unknown how involved his mother and sister are involved in his care now that he is not invited to return to their home. Patient appreciated social work intervention.  Patient/Family's Understanding of and Emotional Response to Diagnosis, Current Treatment, and Prognosis:  Patient has a good understanding of the reason for admission and his need for rehab prior at this time. Patient appears happy with hospital care.  Emotional Assessment Appearance:  Appears stated age Attitude/Demeanor/Rapport:  Other (Pleasant) Affect (typically observed):  Accepting, Appropriate, Calm, Pleasant Orientation:  Oriented to Self, Oriented to Place, Oriented to  Time, Oriented to Situation Alcohol / Substance use:  Illicit Drugs, Alcohol Use Psych involvement (Current and /or in the community):  No (Comment)  Discharge Needs  Concerns to be addressed:  Care Coordination Readmission within the last 30 days:  No Current discharge risk:  Dependent with Mobility, Homeless Barriers to Discharge:  Unsafe home situation, Awaiting Regulatory affairs officer (Pasarr)   Candie Chroman, LCSW 12/06/2016, 11:37 AM

## 2016-12-06 NOTE — Progress Notes (Signed)
Cardiac monitoring discontinued.  Orders received 12/06/16 @ 1240.

## 2016-12-06 NOTE — Clinical Social Work Placement (Signed)
   CLINICAL SOCIAL WORK PLACEMENT  NOTE  Date:  12/06/2016  Patient Details  Name: [name] MRN: [id number] Date of Birth: [birth year]  Clinical Social Work is seeking post-discharge placement for this patient at the Skilled  Nursing Facility level of care (*CSW will initial, date and re-position this form in  chart as items are completed):  Yes   Patient/family provided with [place] Clinical Social Work Department's list of facilities offering this level of care within the geographic area requested by the patient (or if unable, by the patient's family).  Yes   Patient/family informed of their freedom to choose among providers that offer the needed level of care, that participate in Medicare, Medicaid or managed care program needed by the patient, have an available bed and are willing to accept the patient.  Yes   Patient/family informed of [place]'s ownership interest in [facility] and [facility], as well as of the fact that they are under no obligation to receive care at these facilities.  [name] submitted to EDS on 12/06/16     [name] number received on       Existing [name] number confirmed on       FL2 transmitted to all facilities in geographic area requested by pt/family on 12/06/16     FL2 transmitted to all facilities within larger geographic area on       Patient informed that his/her managed care company has contracts with or will negotiate with certain facilities, including the following:            Patient/family informed of bed offers received.  Patient chooses bed at       Physician recommends and patient chooses bed at      Patient to be transferred to   on  .  Patient to be transferred to facility by       Patient family notified on   of transfer.  Name of family member notified:        PHYSICIAN       Additional Comment:    _______________________________________________ Margarito Liner, LCSW 12/06/2016, 11:41 AM

## 2016-12-07 DIAGNOSIS — I10 Essential (primary) hypertension: Secondary | ICD-10-CM | POA: Diagnosis not present

## 2016-12-07 DIAGNOSIS — I5023 Acute on chronic systolic (congestive) heart failure: Secondary | ICD-10-CM | POA: Diagnosis not present

## 2016-12-07 DIAGNOSIS — M6281 Muscle weakness (generalized): Secondary | ICD-10-CM | POA: Diagnosis not present

## 2016-12-07 DIAGNOSIS — I5021 Acute systolic (congestive) heart failure: Secondary | ICD-10-CM | POA: Diagnosis not present

## 2016-12-07 DIAGNOSIS — R278 Other lack of coordination: Secondary | ICD-10-CM | POA: Diagnosis not present

## 2016-12-07 DIAGNOSIS — I2510 Atherosclerotic heart disease of native coronary artery without angina pectoris: Secondary | ICD-10-CM | POA: Diagnosis not present

## 2016-12-07 DIAGNOSIS — I259 Chronic ischemic heart disease, unspecified: Secondary | ICD-10-CM | POA: Diagnosis not present

## 2016-12-07 DIAGNOSIS — F1410 Cocaine abuse, uncomplicated: Secondary | ICD-10-CM | POA: Diagnosis not present

## 2016-12-07 DIAGNOSIS — R1319 Other dysphagia: Secondary | ICD-10-CM | POA: Diagnosis not present

## 2016-12-07 DIAGNOSIS — R2689 Other abnormalities of gait and mobility: Secondary | ICD-10-CM | POA: Diagnosis not present

## 2016-12-07 DIAGNOSIS — I2699 Other pulmonary embolism without acute cor pulmonale: Secondary | ICD-10-CM | POA: Diagnosis not present

## 2016-12-07 DIAGNOSIS — I256 Silent myocardial ischemia: Secondary | ICD-10-CM | POA: Diagnosis not present

## 2016-12-07 LAB — GLUCOSE, CAPILLARY
GLUCOSE-CAPILLARY: 178 mg/dL — AB (ref 65–99)
Glucose-Capillary: 159 mg/dL — ABNORMAL HIGH (ref 65–99)

## 2016-12-07 LAB — BASIC METABOLIC PANEL
Anion gap: 8 (ref 5–15)
BUN: 15 mg/dL (ref 6–20)
CALCIUM: 8 mg/dL — AB (ref 8.9–10.3)
CHLORIDE: 104 mmol/L (ref 101–111)
CO2: 22 mmol/L (ref 22–32)
CREATININE: 0.89 mg/dL (ref 0.61–1.24)
GFR calc Af Amer: >60 mL/min (ref >60)
GFR calc non Af Amer: >60 mL/min (ref >60)
GLUCOSE: 155 mg/dL — AB (ref 65–99)
Potassium: 3.7 mmol/L (ref 3.5–5.1)
Sodium: 134 mmol/L — ABNORMAL LOW (ref 135–145)

## 2016-12-07 LAB — MAGNESIUM: Magnesium: 1.6 mg/dL — ABNORMAL LOW (ref 1.7–2.4)

## 2016-12-07 MED ORDER — INSULIN GLARGINE 100 UNIT/ML ~~LOC~~ SOLN: SUBCUTANEOUS | 0 refills | Status: DC

## 2016-12-07 MED ORDER — MAGNESIUM SULFATE 2 GM/50ML IV SOLN
2.0000 g | Freq: Once | INTRAVENOUS | Status: AC
  Administered 2016-12-07: 2 g via INTRAVENOUS
  Filled 2016-12-07: qty 50

## 2016-12-07 NOTE — Consult Note (Signed)
   [facility] Inpatient Consult   12/07/2016  BRENTLEE DELAGE 17-Feb-1956 [id number]  Patient on the [place] list. Met with the patient at bedside.  Patient confirms that he no longer has a primary care provider in the [place].  He states he goes to the [facility] in [place].  He had been seen by para medicine in the [facility] but has missed appointments.  He states, "I have a car now and trying to get myself back together."  Will update inpatient RNCM that the patient no longer has a [facility] physician and not currently eligible for [facility] Care Management benefits.  For questions, please contact:  Natividad Brood, RN BSN [facility] Liaison  [phone] business mobile phone Toll free office [phone]

## 2016-12-07 NOTE — Progress Notes (Signed)
Patient states he has lost his jeans that had his car keys in them. He states that he "soiled" his jeans in the ED and "staff may have threw them away." Security and ED called and notified; both services do not have belongings. Patient reassured that car will not be towed per security.

## 2016-12-07 NOTE — Progress Notes (Signed)
Internal Medicine Attending  Date: 12/07/2016  Patient name: [name] Medical record number: [id number] Date of birth: 12/07/1955 Age: 61 y.o. Gender: male  I saw and evaluated the patient. I reviewed the resident's note by Dr. Saunders Revel and I agree with the resident's findings and plans as documented in her progress note.  When seen on rounds this morning [name] was without complaints. He is stable for discharge to a skilled nursing facility to work on strength. [facility] [place] [facility] as with the paramedicine program for his heart failure.

## 2016-12-07 NOTE — Progress Notes (Signed)
Patient assisted to medical records via wheelchair by staff member.

## 2016-12-07 NOTE — Progress Notes (Signed)
Patient wants a "paper that shows I lost my keys here." Patient notified to contact medical records. Director and AD made aware of issue.

## 2016-12-07 NOTE — Progress Notes (Signed)
Subjective:  [name] was seen laying comfortably in bed this morning, almost fully reclined to a flat position. He states that his breathing is much improved and that he doesn't have any complaints of cough or chest pain. He walked around the room and hallway yesterday and continues to endorse weakness of lower extremities, but states that his strength is slowly improving. He also thinks the swelling in his legs has resolved.   Objective:  Vital signs in last 24 hours: Vitals:   12/06/16 1121 12/06/16 1648 12/06/16 2030 12/07/16 0443  BP: (!) 92/52 107/64 125/68 130/78  Pulse: 80 82 85 80  Resp: 18 18 18 18   Temp: 98.2 F (36.8 C) 98.6 F (37 C) 97.9 F (36.6 C) 97.8 F (36.6 C)  TempSrc: Oral Oral Oral Oral  SpO2: 97% 96% 98% 98%  Weight:    155 lb 9.6 oz (70.6 kg)  Height:       Physical Exam  Constitutional: He appears well-developed and well-nourished. No distress.  Laying comfortably with [place] in place but without supplemental oxygen turned on.  Neck: No JVD present.  Cardiovascular: Normal rate, regular rhythm, normal heart sounds and intact distal pulses.   No murmur heard. Pulmonary/Chest: Effort normal and breath sounds normal. No respiratory distress.  NO crackles appreciated, interval improvement from yesterday's exam.  Abdominal: Soft. He exhibits no distension. There is no tenderness.  Musculoskeletal: He exhibits no edema (of bilateral lower extremities, stable from yesterday's exam) or tenderness (of bilateral lower extremities).  Skin: Skin is warm and dry. No rash noted. No erythema.   Assessment/Plan:  Principal Problem:   Acute exacerbation of CHF (congestive heart failure) (HCC) Active Problems:   H/O noncompliance with medical treatment, presenting hazards to health   Chronic systolic CHF (congestive heart failure) (HCC)   Myocardial ischemia   Pulmonary embolus (HCC)   Cocaine abuse  [name] is a 61 yo with a PMH of CHF (EF =10-15%),  nonobstructive CAD, nonischemic cardiomyopathy, polysubstance abuse, depression and anxiety who presented to the ED with an acute onset of substernal, non-radiating chest pain. He was found to be hypoxic, tachycardic, and tachypneic on presentation. He was admitted to the internal medicine teaching service for management. The specific problems addressed on his admission are as follows.  Acute exacerbation of chronic heart failure with reduced EF (4/18 echo EF = 10-15%): The patient's SOB and hypoxia on admission significantly improved with IV diuresis and the patient was stable on RA yesterday and started on home oral lasix 40 mg BID yesterday. The patient experienced an increase in Cr above baseline with IV diuretics, but his Cr is back to baseline of 0.89 with switch to oral diuretics. -CHF consulted signed off, will follow as outpatient via Paramedicine team.  -Continue home digoxin 0.125 mg,IMDUR 30 mg, Entresto 24-26 mg BID, aspirin 81 mg, and atorvastatin 20 mg daily. Continue Lasix 40 mg BID. Start carvedilol 3.125 mg BID on discharge.  Small bilateral lower lobe subsegmental pulmonary emboli: He will continue Xarelto 15 mg BID until 12/26/2016 when he will switch to Xarelto 20 mg qD (maintenance dose) for treatment of small, bilateral PE found on admission. He will need this regimen for at least 6 months, given the unprovoked nature of VTE. He will likely indefinitely as his risk factors for developing PE, including venous stasis 2/2 CHF with EF 10-15%, will not change.   Hx of substance abuse, including alcohol, tobacco, and cocaine:CIWA protocol while inpatient, without any signs  of alcohol withdrawal which necessitated administration of Ativan throughout admission. Patient had nicotine patch for tobacco abuse while inpatient.   Hx of insulin dependent diabetes: Patient on metformin 1000 mg BID and 40 units Lantus qNight at home. Will resume this regimen on discharge.   Hx of  depression/anxiety: Continue sertraline 100 mg daily, buproprion XL 150 mg daily, and aripiprazole 5 mg daily. Follow up mood symptoms as outpatient with PCP at [facility].   FEN/GI: -Heart healthy diet with fluid restriction -Mg replacement today -K-Dur 40 mEq daily  Dispo: Anticipated discharge in approximately 1 day to [facility].   Rozann Lesches, MD 12/07/2016, 8:34 AM Pager: [phone]

## 2016-12-07 NOTE — Progress Notes (Signed)
Physical Therapy Treatment Patient Details Name: [name] MRN: [id number] DOB: 09-06-1955 Today's Date: 12/07/2016    History of Present Illness Admitted 12/04/16 with SOB, volume overload. Had not taken his medications for 3 days, + cocaine.  PMH: CAD, CHF, HTN,  ETOH and cocaine abuse.    PT Comments    Patient is making gradual progress toward mobility goals and tolerated ambulation of 71ft. Pt limited by fatigue. SpO2 92% or > on RA with mobility. Current plan remains appropriate.   Follow Up Recommendations  SNF;Supervision/Assistance - 24 hour     Equipment Recommendations  None recommended by PT    Recommendations for Other Services       Precautions / Restrictions Precautions Precautions: Fall Restrictions Weight Bearing Restrictions: No    Mobility  Bed Mobility Overal bed mobility: Independent                Transfers Overall transfer level: Needs assistance Equipment used: Rolling walker (2 wheeled) Transfers: Sit to/from Stand Sit to Stand: Min guard         General transfer comment: min guard for safety; pt needed a little time to gain balance   Ambulation/Gait Ambulation/Gait assistance: Min guard Ambulation Distance (Feet): 50 Feet Assistive device: Rolling walker (2 wheeled) Gait Pattern/deviations: Step-through pattern;Decreased step length - right;Decreased step length - left;Trunk flexed Gait velocity: decreased   General Gait Details: min guard for safety with use of AD; cues for posture, cadence and proximity of RW; unsteady with turns but no LOB   Stairs            Wheelchair Mobility    Modified Rankin (Stroke Patients Only)       Balance Overall balance assessment: Needs assistance;History of Falls Sitting-balance support: No upper extremity supported;Feet supported Sitting balance-Leahy Scale: Fair     Standing balance support: During functional activity Standing balance-Leahy Scale: Poor Standing  balance comment: Pt requires min guard assist, and UE support                             Cognition Arousal/Alertness: Awake/alert Behavior During Therapy: Flat affect Overall Cognitive Status: Within Functional Limits for tasks assessed                                        Exercises General Exercises - Lower Extremity Long Arc Quad: AROM;Both;15 reps;Seated Hip Flexion/Marching: AROM;Both;20 reps;Seated    General Comments General comments (skin integrity, edema, etc.): SpO2 92% or > on RA with mobility      Pertinent Vitals/Pain Pain Assessment: No/denies pain    Home Living                      Prior Function            PT Goals (current goals can now be found in the care plan section) Acute Rehab PT Goals Patient Stated Goal: To regain strength  PT Goal Formulation: With patient Time For Goal Achievement: 12/19/16 Potential to Achieve Goals: Good Progress towards PT goals: Progressing toward goals    Frequency    Min 3X/week      PT Plan Current plan remains appropriate    Co-evaluation              AM-PAC PT "6 Clicks" Daily Activity  Outcome Measure  Difficulty turning  over in bed (including adjusting bedclothes, sheets and blankets)?: None Difficulty moving from lying on back to sitting on the side of the bed? : A Little Difficulty sitting down on and standing up from a chair with arms (e.g., wheelchair, bedside commode, etc,.)?: A Little Help needed moving to and from a bed to chair (including a wheelchair)?: A Little Help needed walking in hospital room?: A Little Help needed climbing 3-5 steps with a railing? : A Lot 6 Click Score: 18    End of Session Equipment Utilized During Treatment: Gait belt Activity Tolerance: Patient limited by fatigue Patient left: in bed;with call bell/phone within reach Nurse Communication: Mobility status PT Visit Diagnosis: Unsteadiness on feet (R26.81);Repeated falls  (R29.6);Muscle weakness (generalized) (M62.81)     Time: 1610-9604 PT Time Calculation (min) (ACUTE ONLY): 23 min  Charges:  $Gait Training: 8-22 mins $Therapeutic Exercise: 8-22 mins                    G Codes:       [name], PTA Pager: [phone]     [name] 12/07/2016, 9:14 AM

## 2016-12-07 NOTE — Clinical Social Work Note (Addendum)
PASARR obtained: [id number] E. Left message for [facility] admissions coordinator to notify of patient accepting bed offer.  Charlynn Court, CSW [phone]  9:59 am No response from admissions coordinator yet. CSW called and left a voicemail.  Charlynn Court, CSW [phone]  11:40 am CSW spoke with admissions coordinator who confirmed patient can discharge to their facility today. The DON needs two things clarified with medications on the discharge summary: 1. Does the Rivaroxaban 15 mg need to be dc'ed on 9/2 since the 20 mg will start on 9/3? 2. The wording is not clear on the Lantus for CBG <300. The facilities needs to know an exact dose of what he needs and how often. The MD will also have to update the discharge date on the summary to today. CSW paged MD at 11:15 and 11:38. Awaiting call back.  Charlynn Court, CSW [phone]

## 2016-12-07 NOTE — Progress Notes (Signed)
Patient has order to discharge to SNF. Report given to [place] at [facility].

## 2016-12-07 NOTE — Clinical Social Work Placement (Signed)
   CLINICAL SOCIAL WORK PLACEMENT  NOTE  Date:  12/07/2016  Patient Details  Name: [name] MRN: [id number] Date of Birth: 17-Sep-1955  Clinical Social Work is seeking post-discharge placement for this patient at the Skilled  Nursing Facility level of care (*CSW will initial, date and re-position this form in  chart as items are completed):  Yes   Patient/family provided with [facility] Clinical Social Work Department's list of facilities offering this level of care within the geographic area requested by the patient (or if unable, by the patient's family).  Yes   Patient/family informed of their freedom to choose among providers that offer the needed level of care, that participate in Medicare, Medicaid or managed care program needed by the patient, have an available bed and are willing to accept the patient.  Yes   Patient/family informed of [facility]'s ownership interest in [facility] and [facility], as well as of the fact that they are under no obligation to receive care at these facilities.  [name] submitted to EDS on 12/06/16     [name] number received on 12/07/16     Existing [name] number confirmed on       FL2 transmitted to all facilities in geographic area requested by pt/family on 12/06/16     FL2 transmitted to all facilities within larger geographic area on       Patient informed that his/her managed care company has contracts with or will negotiate with certain facilities, including the following:        Yes   Patient/family informed of bed offers received.  Patient chooses bed at [facility]     Physician recommends and patient chooses bed at      Patient to be transferred to [facility] on 12/07/16.  Patient to be transferred to facility by Friend to transport     Patient family notified on 12/07/16 of transfer.  Name of family member notified:        PHYSICIAN       Additional Comment:     _______________________________________________ Margarito Liner, LCSW 12/07/2016, 3:19 PM

## 2016-12-07 NOTE — Progress Notes (Signed)
Note written by AD regarding missing keys.

## 2016-12-07 NOTE — Progress Notes (Signed)
Subjective: [name] states that he feels well this morning. He has had no shortness of breath over the past few days and has not felt as anxious. He has not been able to walk around as much as baseline, but he is starting to walk around with more ease. He feels that his ankle edema has resolved. He feels ready to leave the hospital at 2pm.  Objective: Vital signs in last 24 hours: Vitals:   12/06/16 1121 12/06/16 1648 12/06/16 2030 12/07/16 0443  BP: (!) 92/52 107/64 125/68 130/78  Pulse: 80 82 85 80  Resp: 18 18 18 18   Temp: 98.2 F (36.8 C) 98.6 F (37 C) 97.9 F (36.6 C) 97.8 F (36.6 C)  TempSrc: Oral Oral Oral Oral  SpO2: 97% 96% 98% 98%  Weight:    70.6 kg (155 lb 9.6 oz)  Height:       Weight change: -0.499 kg (-1 lb 1.6 oz)  Intake/Output Summary (Last 24 hours) at 12/07/16 1052 Last data filed at 12/07/16 0900  Gross per 24 hour  Intake             1975 ml  Output             1725 ml  Net              250 ml   General appearance: alert, cooperative and no distress Lungs: clear to auscultation bilaterally Heart: regular rate and rhythm, S1, S2 normal, no murmur, click, rub or gallop Abdomen: soft, non-tender; bowel sounds normal; no masses,  no organomegaly Pulses: 2+ and symmetric  Extremities: no edema on legs or ankles  Lab Results: BMP: Na+ 134, K+ 3.7, Cl- 104, CO2 22, glucose 155, BUN 15, Cr 0.89, Ca2+ 8.0 Mg2+: 1.6 CBC WNL  Micro Results: Recent Results (from the past 240 hour(s))  MRSA PCR Screening     Status: None   Collection Time: 12/04/16  3:58 PM  Result Value Ref Range Status   MRSA by PCR NEGATIVE NEGATIVE Final    Comment:        The GeneXpert MRSA Assay (FDA approved for NASAL specimens only), is one component of a comprehensive MRSA colonization surveillance program. It is not intended to diagnose MRSA infection nor to guide or monitor treatment for MRSA infections.    Studies/Results: No results found. Medications: I  have reviewed the patient's current medications. Scheduled Meds: . ARIPiprazole  5 mg Oral Daily  . aspirin  81 mg Oral Daily  . atorvastatin  20 mg Oral q1800  . buPROPion  150 mg Oral Daily  . digoxin  0.125 mg Oral Daily  . folic acid  1 mg Oral Daily  . furosemide  40 mg Oral BID  . hydrALAZINE  25 mg Oral TID  . insulin aspart  0-9 Units Subcutaneous TID WC  . insulin glargine  20 Units Subcutaneous QHS  . isosorbide mononitrate  30 mg Oral Daily  . multivitamin with minerals  1 tablet Oral Daily  . nicotine  7 mg Transdermal Daily  . potassium chloride  40 mEq Oral Daily  . Rivaroxaban  15 mg Oral BID WC  . [START ON 12/26/2016] rivaroxaban  20 mg Oral Q supper  . sacubitril-valsartan  1 tablet Oral BID  . sertraline  100 mg Oral QHS  . thiamine  100 mg Oral Daily   Or  . thiamine  100 mg Intravenous Daily   Continuous Infusions: PRN Meds:.acetaminophen, nitroGLYCERIN Assessment/Plan: Principal Problem:  Acute exacerbation of CHF (congestive heart failure) (HCC) Active Problems:   H/O noncompliance with medical treatment, presenting hazards to health   Chronic systolic CHF (congestive heart failure) (HCC)   Myocardial ischemia   Pulmonary embolus (HCC)   Cocaine abuse  [name] is a 61 year old with a past medical history of CHF, HTN, CAD, type II DM and alcohol and cocaine abuse who presents due to chest pain in the setting of volume overload.  HFrEF exacerbation: [name] has responded well symptomatically to the diuretics given to him over the past three days, and he has had a net negative of about 1.5L since admission. His weight today (155.75 lbs) is about 2 lbs down from his presenting weight. 155 lb is likely his dry weight. He is breathing with more ease and does not have as many crackles or as much edema on exam. His AKI has resolved (Cr this morning 0.89). He is cleared for discharge and is currently awaiting placement for a SNF. - continue PO lasix 40  mg BID - continue home  heart failure meds (Imdur 30 mg qd, K-Dur 20 mEq qd, Entresto 24-26 mg, asa 81 mg qd, atorvastatin 20 mg qd  Small bilateral lower lobe pulmonary emboli: He has had no other chest pain or shortness of breath since admission. - continue Xarelto 20 mg qd - pharmacy consulted, appreciate their recommendations  Substance abuse: UDS positive for cocaine. Blood alcohol <5 on admission. He admitted to using both on Saturday. No signs of alcohol withdrawal so far. Mentation seems better compared to day of admission. - d/c CIWA now that it's been three days without signs of withdrawal - Nicotine patch 7 mg - we will speak with him about the dangers of substance abuse  Type II DM: He takes metformin 1000 mg BID at home. Glucose on admission 202. Most recent A1c was 9.6 on 08/05/16. His blood sugars have been well controlled since admission. - continueLantus 20 units and SSI  Depression/anxiety: We will continue to monitor his mental status and mood. He states that he has felt more anxious since we discontinued his home anxiolytics, which include Zoloft. We plan to restart this medication along with bupropion to combat his anxiety and depression. - continue sertraline 100 mg qd - continue bupropion 150 mg qd  This is a Psychologist, occupational Note.  The care of the patient was discussed with Dr. Saunders Revel and the assessment and plan formulated with their assistance.  Please see their attached note for official documentation of the daily encounter.   LOS: 3 days   Shon Baton, Medical Student 12/07/2016, 10:52 AM

## 2016-12-07 NOTE — Clinical Social Work Note (Addendum)
CSW facilitated patient discharge including contacting patient family and facility to confirm patient discharge plans. Clinical information faxed to facility and family agreeable with plan. Patient's friend that is bringing his clothes will be here in 10-15 minutes. She will transport him by car to [employer]. RN to call report prior to discharge [phone]).  CSW will sign off for now as social work intervention is no longer needed. Please consult [place] again if new needs arise.  Charlynn Court, CSW [phone]  4:19 pm Patient states he is unable to find his keys. His friend still plans to transport him to the SNF. SNF admissions coordinator aware. Transport paperwork filled out in case patient needs PTAR. If RN needs to call before 5:00, the phone number is [phone]. If after 5:00, the phone number is [phone]. All information they need is on the transport paperwork.   CSW signing off again.  Charlynn Court, CSW [phone]

## 2016-12-07 NOTE — Care Management Important Message (Signed)
Important Message  Patient Details  Name: [name] MRN: [id number] Date of Birth: 08/20/1955   Medicare Important Message Given:  Yes    Elliot Cousin, RN 12/07/2016, 10:47 AM

## 2016-12-08 DIAGNOSIS — I10 Essential (primary) hypertension: Secondary | ICD-10-CM | POA: Diagnosis not present

## 2016-12-08 DIAGNOSIS — I2510 Atherosclerotic heart disease of native coronary artery without angina pectoris: Secondary | ICD-10-CM | POA: Diagnosis not present

## 2016-12-08 DIAGNOSIS — I2699 Other pulmonary embolism without acute cor pulmonale: Secondary | ICD-10-CM | POA: Diagnosis not present

## 2016-12-08 DIAGNOSIS — I5021 Acute systolic (congestive) heart failure: Secondary | ICD-10-CM | POA: Diagnosis not present

## 2016-12-12 ENCOUNTER — Other Ambulatory Visit (HOSPITAL_COMMUNITY): Payer: Self-pay

## 2016-12-12 NOTE — Progress Notes (Signed)
Came by SNF [facility] to visit pt, pt seemed to be doing very well. Mood and spirits good, he states he has been doing good and following doc orders now-his mom had to sell house and he had to move out and he got off track. He would like one day to for me to come out soon to help him go through apt listings and find one. He reports financially he is ok to do the deposit if it has one. Unsure of how long he will be in SNF.  PT arrived during our short visit so I left so they can get to work.

## 2016-12-21 ENCOUNTER — Inpatient Hospital Stay (HOSPITAL_COMMUNITY): Admit: 2016-12-21 | Payer: Self-pay

## 2016-12-29 ENCOUNTER — Telehealth (HOSPITAL_COMMUNITY): Payer: Self-pay

## 2016-12-29 NOTE — Telephone Encounter (Signed)
Called pt but no answer. Called [facility] and he was d/c last Friday. She was not able to give me any info regarding a number for him or his address that he was going to. She transferred me to a Child psychotherapist but no answer so I left message but I have not had a return call. Spoke to [place] and she will give them a call and hope to find out info.

## 2017-01-02 ENCOUNTER — Telehealth: Payer: Self-pay | Admitting: Licensed Clinical Social Worker

## 2017-01-02 NOTE — Telephone Encounter (Signed)
CSW left message for the SW at [facility] to follow up on patient's whereabouts. Paramedic has been unsuccessful in locating or contacting patient since discharge from SNF. CSW awaiting return call. Lasandra Beech, LCSW, CCSW-MCS [phone]

## 2017-01-17 ENCOUNTER — Telehealth (HOSPITAL_COMMUNITY): Payer: Self-pay | Admitting: Surgery

## 2017-01-17 NOTE — Telephone Encounter (Signed)
[name] will be discharged from the [facility] Community Paramedicine Program at this time.  He has not been reachable by phone since discharge from SNF nor attended any HF scheduled appointments.

## 2017-02-17 ENCOUNTER — Encounter (HOSPITAL_COMMUNITY): Payer: Self-pay | Admitting: Emergency Medicine

## 2017-02-17 ENCOUNTER — Inpatient Hospital Stay (HOSPITAL_COMMUNITY)
Admission: EM | Admit: 2017-02-17 | Discharge: 2017-02-28 | DRG: 871 | Disposition: A | Discharge status: Skilled Nursing Facility | Payer: Medicare Other | Attending: Internal Medicine | Admitting: Internal Medicine

## 2017-02-17 DIAGNOSIS — F1910 Other psychoactive substance abuse, uncomplicated: Secondary | ICD-10-CM | POA: Diagnosis not present

## 2017-02-17 DIAGNOSIS — R768 Other specified abnormal immunological findings in serum: Secondary | ICD-10-CM | POA: Diagnosis not present

## 2017-02-17 DIAGNOSIS — N410 Acute prostatitis: Secondary | ICD-10-CM

## 2017-02-17 DIAGNOSIS — Z7901 Long term (current) use of anticoagulants: Secondary | ICD-10-CM

## 2017-02-17 DIAGNOSIS — B182 Chronic viral hepatitis C: Secondary | ICD-10-CM | POA: Diagnosis not present

## 2017-02-17 DIAGNOSIS — E871 Hypo-osmolality and hyponatremia: Secondary | ICD-10-CM | POA: Diagnosis not present

## 2017-02-17 DIAGNOSIS — N3000 Acute cystitis without hematuria: Secondary | ICD-10-CM | POA: Diagnosis present

## 2017-02-17 DIAGNOSIS — I5022 Chronic systolic (congestive) heart failure: Secondary | ICD-10-CM | POA: Diagnosis present

## 2017-02-17 DIAGNOSIS — N412 Abscess of prostate: Secondary | ICD-10-CM | POA: Diagnosis not present

## 2017-02-17 DIAGNOSIS — F418 Other specified anxiety disorders: Secondary | ICD-10-CM | POA: Diagnosis not present

## 2017-02-17 DIAGNOSIS — N179 Acute kidney failure, unspecified: Secondary | ICD-10-CM | POA: Diagnosis not present

## 2017-02-17 DIAGNOSIS — N289 Disorder of kidney and ureter, unspecified: Secondary | ICD-10-CM | POA: Diagnosis not present

## 2017-02-17 DIAGNOSIS — E872 Acidosis: Secondary | ICD-10-CM | POA: Diagnosis not present

## 2017-02-17 DIAGNOSIS — I959 Hypotension, unspecified: Secondary | ICD-10-CM | POA: Diagnosis not present

## 2017-02-17 DIAGNOSIS — I252 Old myocardial infarction: Secondary | ICD-10-CM

## 2017-02-17 DIAGNOSIS — F1410 Cocaine abuse, uncomplicated: Secondary | ICD-10-CM | POA: Diagnosis not present

## 2017-02-17 DIAGNOSIS — L0291 Cutaneous abscess, unspecified: Secondary | ICD-10-CM

## 2017-02-17 DIAGNOSIS — Z833 Family history of diabetes mellitus: Secondary | ICD-10-CM | POA: Diagnosis not present

## 2017-02-17 DIAGNOSIS — I2699 Other pulmonary embolism without acute cor pulmonale: Secondary | ICD-10-CM | POA: Diagnosis present

## 2017-02-17 DIAGNOSIS — F329 Major depressive disorder, single episode, unspecified: Secondary | ICD-10-CM | POA: Diagnosis not present

## 2017-02-17 DIAGNOSIS — E118 Type 2 diabetes mellitus with unspecified complications: Secondary | ICD-10-CM | POA: Diagnosis present

## 2017-02-17 DIAGNOSIS — Z8249 Family history of ischemic heart disease and other diseases of the circulatory system: Secondary | ICD-10-CM | POA: Diagnosis not present

## 2017-02-17 DIAGNOSIS — Z9689 Presence of other specified functional implants: Secondary | ICD-10-CM | POA: Diagnosis not present

## 2017-02-17 DIAGNOSIS — I10 Essential (primary) hypertension: Secondary | ICD-10-CM | POA: Diagnosis not present

## 2017-02-17 DIAGNOSIS — E1165 Type 2 diabetes mellitus with hyperglycemia: Secondary | ICD-10-CM | POA: Diagnosis present

## 2017-02-17 DIAGNOSIS — I429 Cardiomyopathy, unspecified: Secondary | ICD-10-CM | POA: Diagnosis not present

## 2017-02-17 DIAGNOSIS — A419 Sepsis, unspecified organism: Principal | ICD-10-CM | POA: Diagnosis present

## 2017-02-17 DIAGNOSIS — I2510 Atherosclerotic heart disease of native coronary artery without angina pectoris: Secondary | ICD-10-CM | POA: Diagnosis not present

## 2017-02-17 DIAGNOSIS — I509 Heart failure, unspecified: Secondary | ICD-10-CM | POA: Diagnosis not present

## 2017-02-17 DIAGNOSIS — B9689 Other specified bacterial agents as the cause of diseases classified elsewhere: Secondary | ICD-10-CM | POA: Diagnosis not present

## 2017-02-17 DIAGNOSIS — I428 Other cardiomyopathies: Secondary | ICD-10-CM

## 2017-02-17 DIAGNOSIS — Z7982 Long term (current) use of aspirin: Secondary | ICD-10-CM | POA: Diagnosis not present

## 2017-02-17 DIAGNOSIS — K529 Noninfective gastroenteritis and colitis, unspecified: Secondary | ICD-10-CM

## 2017-02-17 DIAGNOSIS — R339 Retention of urine, unspecified: Secondary | ICD-10-CM | POA: Diagnosis not present

## 2017-02-17 DIAGNOSIS — B379 Candidiasis, unspecified: Secondary | ICD-10-CM | POA: Diagnosis not present

## 2017-02-17 DIAGNOSIS — I110 Hypertensive heart disease with heart failure: Secondary | ICD-10-CM | POA: Diagnosis not present

## 2017-02-17 DIAGNOSIS — N419 Inflammatory disease of prostate, unspecified: Secondary | ICD-10-CM

## 2017-02-17 DIAGNOSIS — Z794 Long term (current) use of insulin: Secondary | ICD-10-CM | POA: Diagnosis not present

## 2017-02-17 DIAGNOSIS — B9562 Methicillin resistant Staphylococcus aureus infection as the cause of diseases classified elsewhere: Secondary | ICD-10-CM | POA: Diagnosis present

## 2017-02-17 DIAGNOSIS — E43 Unspecified severe protein-calorie malnutrition: Secondary | ICD-10-CM | POA: Diagnosis present

## 2017-02-17 DIAGNOSIS — I2609 Other pulmonary embolism with acute cor pulmonale: Secondary | ICD-10-CM | POA: Diagnosis not present

## 2017-02-17 DIAGNOSIS — Z681 Body mass index (BMI) 19 or less, adult: Secondary | ICD-10-CM

## 2017-02-17 DIAGNOSIS — N3090 Cystitis, unspecified without hematuria: Secondary | ICD-10-CM | POA: Diagnosis not present

## 2017-02-17 DIAGNOSIS — Z86711 Personal history of pulmonary embolism: Secondary | ICD-10-CM

## 2017-02-17 DIAGNOSIS — Z5181 Encounter for therapeutic drug level monitoring: Secondary | ICD-10-CM | POA: Diagnosis not present

## 2017-02-17 DIAGNOSIS — F17210 Nicotine dependence, cigarettes, uncomplicated: Secondary | ICD-10-CM | POA: Diagnosis present

## 2017-02-17 DIAGNOSIS — B1920 Unspecified viral hepatitis C without hepatic coma: Secondary | ICD-10-CM | POA: Diagnosis present

## 2017-02-17 DIAGNOSIS — N1330 Unspecified hydronephrosis: Secondary | ICD-10-CM | POA: Diagnosis not present

## 2017-02-17 LAB — CBC
HCT: 37.4 % — ABNORMAL LOW (ref 39.0–52.0)
Hemoglobin: 13.2 g/dL (ref 13.0–17.0)
MCH: 29.5 pg (ref 26.0–34.0)
MCHC: 35.3 g/dL (ref 30.0–36.0)
MCV: 83.5 fL (ref 78.0–100.0)
PLATELETS: 398 10*3/uL (ref 150–400)
RBC: 4.48 MIL/uL (ref 4.22–5.81)
RDW: 12.7 % (ref 11.5–15.5)
WBC: 9.4 10*3/uL (ref 4.0–10.5)

## 2017-02-17 LAB — COMPREHENSIVE METABOLIC PANEL
ALBUMIN: 2.2 g/dL — AB (ref 3.5–5.0)
ALK PHOS: 126 U/L (ref 38–126)
ALT: 14 U/L — ABNORMAL LOW (ref 17–63)
AST: 21 U/L (ref 15–41)
Anion gap: 14 (ref 5–15)
BILIRUBIN TOTAL: 0.8 mg/dL (ref 0.3–1.2)
BUN: 22 mg/dL — AB (ref 6–20)
CALCIUM: 8.4 mg/dL — AB (ref 8.9–10.3)
CO2: 17 mmol/L — ABNORMAL LOW (ref 22–32)
Chloride: 97 mmol/L — ABNORMAL LOW (ref 101–111)
Creatinine, Ser: 1.55 mg/dL — ABNORMAL HIGH (ref 0.61–1.24)
GFR calc Af Amer: 54 mL/min — ABNORMAL LOW (ref >60)
GFR, EST NON AFRICAN AMERICAN: 46 mL/min — AB (ref >60)
GLUCOSE: 435 mg/dL — AB (ref 65–99)
Potassium: 4 mmol/L (ref 3.5–5.1)
Sodium: 128 mmol/L — ABNORMAL LOW (ref 135–145)
TOTAL PROTEIN: 8.3 g/dL — AB (ref 6.5–8.1)

## 2017-02-17 LAB — LIPASE, BLOOD: Lipase: 92 U/L — ABNORMAL HIGH (ref 11–51)

## 2017-02-17 MED ORDER — SODIUM CHLORIDE 0.9 % IV BOLUS (SEPSIS)
1000.0000 mL | Freq: Once | INTRAVENOUS | Status: AC
  Administered 2017-02-18: 1000 mL via INTRAVENOUS

## 2017-02-17 NOTE — Care Management (Addendum)
ED CM reviewed patient's record, patient was following at [facility], but was discharge because he was not reachable last month. This patient has had 3 ED visits in the past 6 monts and may benefit from [facility] Management program to assist with self- management wellness program. [facility] referral placed.

## 2017-02-17 NOTE — ED Triage Notes (Signed)
Pt reports loss of loss of bowel control, and loose stoo. for 5 days. Pt indicates it also "hurts to pee".  Pt reports new lower back pain, weakness and inability to eat for 5 days as well.

## 2017-02-18 ENCOUNTER — Emergency Department (HOSPITAL_COMMUNITY): Payer: Medicare Other

## 2017-02-18 ENCOUNTER — Inpatient Hospital Stay (HOSPITAL_COMMUNITY): Payer: Medicare Other

## 2017-02-18 ENCOUNTER — Encounter (HOSPITAL_COMMUNITY): Payer: Self-pay | Admitting: Radiology

## 2017-02-18 DIAGNOSIS — B182 Chronic viral hepatitis C: Secondary | ICD-10-CM | POA: Diagnosis not present

## 2017-02-18 DIAGNOSIS — E872 Acidosis: Secondary | ICD-10-CM | POA: Diagnosis not present

## 2017-02-18 DIAGNOSIS — R3914 Feeling of incomplete bladder emptying: Secondary | ICD-10-CM | POA: Diagnosis not present

## 2017-02-18 DIAGNOSIS — T83090A Other mechanical complication of cystostomy catheter, initial encounter: Secondary | ICD-10-CM | POA: Diagnosis not present

## 2017-02-18 DIAGNOSIS — M6281 Muscle weakness (generalized): Secondary | ICD-10-CM | POA: Diagnosis not present

## 2017-02-18 DIAGNOSIS — N1330 Unspecified hydronephrosis: Secondary | ICD-10-CM | POA: Diagnosis not present

## 2017-02-18 DIAGNOSIS — I10 Essential (primary) hypertension: Secondary | ICD-10-CM | POA: Diagnosis not present

## 2017-02-18 DIAGNOSIS — I428 Other cardiomyopathies: Secondary | ICD-10-CM | POA: Diagnosis not present

## 2017-02-18 DIAGNOSIS — N3000 Acute cystitis without hematuria: Secondary | ICD-10-CM | POA: Diagnosis present

## 2017-02-18 DIAGNOSIS — B9562 Methicillin resistant Staphylococcus aureus infection as the cause of diseases classified elsewhere: Secondary | ICD-10-CM | POA: Diagnosis not present

## 2017-02-18 DIAGNOSIS — B999 Unspecified infectious disease: Secondary | ICD-10-CM | POA: Diagnosis not present

## 2017-02-18 DIAGNOSIS — Z681 Body mass index (BMI) 19 or less, adult: Secondary | ICD-10-CM | POA: Diagnosis not present

## 2017-02-18 DIAGNOSIS — N179 Acute kidney failure, unspecified: Secondary | ICD-10-CM | POA: Diagnosis not present

## 2017-02-18 DIAGNOSIS — E871 Hypo-osmolality and hyponatremia: Secondary | ICD-10-CM | POA: Diagnosis not present

## 2017-02-18 DIAGNOSIS — I2609 Other pulmonary embolism with acute cor pulmonale: Secondary | ICD-10-CM | POA: Diagnosis not present

## 2017-02-18 DIAGNOSIS — I110 Hypertensive heart disease with heart failure: Secondary | ICD-10-CM | POA: Diagnosis present

## 2017-02-18 DIAGNOSIS — N400 Enlarged prostate without lower urinary tract symptoms: Secondary | ICD-10-CM | POA: Diagnosis not present

## 2017-02-18 DIAGNOSIS — I5022 Chronic systolic (congestive) heart failure: Secondary | ICD-10-CM | POA: Diagnosis not present

## 2017-02-18 DIAGNOSIS — I429 Cardiomyopathy, unspecified: Secondary | ICD-10-CM | POA: Diagnosis not present

## 2017-02-18 DIAGNOSIS — A419 Sepsis, unspecified organism: Secondary | ICD-10-CM | POA: Diagnosis not present

## 2017-02-18 DIAGNOSIS — N412 Abscess of prostate: Secondary | ICD-10-CM | POA: Diagnosis present

## 2017-02-18 DIAGNOSIS — R339 Retention of urine, unspecified: Secondary | ICD-10-CM | POA: Diagnosis present

## 2017-02-18 DIAGNOSIS — R338 Other retention of urine: Secondary | ICD-10-CM | POA: Diagnosis not present

## 2017-02-18 DIAGNOSIS — F1910 Other psychoactive substance abuse, uncomplicated: Secondary | ICD-10-CM | POA: Diagnosis not present

## 2017-02-18 DIAGNOSIS — N410 Acute prostatitis: Secondary | ICD-10-CM | POA: Diagnosis not present

## 2017-02-18 DIAGNOSIS — Z5181 Encounter for therapeutic drug level monitoring: Secondary | ICD-10-CM | POA: Diagnosis not present

## 2017-02-18 DIAGNOSIS — I5040 Unspecified combined systolic (congestive) and diastolic (congestive) heart failure: Secondary | ICD-10-CM | POA: Diagnosis not present

## 2017-02-18 DIAGNOSIS — R109 Unspecified abdominal pain: Secondary | ICD-10-CM | POA: Diagnosis not present

## 2017-02-18 DIAGNOSIS — R350 Frequency of micturition: Secondary | ICD-10-CM | POA: Diagnosis not present

## 2017-02-18 DIAGNOSIS — Z9689 Presence of other specified functional implants: Secondary | ICD-10-CM | POA: Diagnosis not present

## 2017-02-18 DIAGNOSIS — Z833 Family history of diabetes mellitus: Secondary | ICD-10-CM | POA: Diagnosis not present

## 2017-02-18 DIAGNOSIS — R768 Other specified abnormal immunological findings in serum: Secondary | ICD-10-CM | POA: Diagnosis not present

## 2017-02-18 DIAGNOSIS — R0600 Dyspnea, unspecified: Secondary | ICD-10-CM | POA: Diagnosis not present

## 2017-02-18 DIAGNOSIS — I2510 Atherosclerotic heart disease of native coronary artery without angina pectoris: Secondary | ICD-10-CM | POA: Diagnosis not present

## 2017-02-18 DIAGNOSIS — Z7901 Long term (current) use of anticoagulants: Secondary | ICD-10-CM | POA: Diagnosis not present

## 2017-02-18 DIAGNOSIS — N419 Inflammatory disease of prostate, unspecified: Secondary | ICD-10-CM | POA: Diagnosis not present

## 2017-02-18 DIAGNOSIS — I252 Old myocardial infarction: Secondary | ICD-10-CM | POA: Diagnosis not present

## 2017-02-18 DIAGNOSIS — E1165 Type 2 diabetes mellitus with hyperglycemia: Secondary | ICD-10-CM | POA: Diagnosis present

## 2017-02-18 DIAGNOSIS — F418 Other specified anxiety disorders: Secondary | ICD-10-CM | POA: Diagnosis not present

## 2017-02-18 DIAGNOSIS — F1410 Cocaine abuse, uncomplicated: Secondary | ICD-10-CM | POA: Diagnosis not present

## 2017-02-18 DIAGNOSIS — E1129 Type 2 diabetes mellitus with other diabetic kidney complication: Secondary | ICD-10-CM | POA: Diagnosis not present

## 2017-02-18 DIAGNOSIS — R309 Painful micturition, unspecified: Secondary | ICD-10-CM | POA: Diagnosis not present

## 2017-02-18 DIAGNOSIS — R102 Pelvic and perineal pain: Secondary | ICD-10-CM | POA: Diagnosis not present

## 2017-02-18 DIAGNOSIS — F17210 Nicotine dependence, cigarettes, uncomplicated: Secondary | ICD-10-CM | POA: Diagnosis present

## 2017-02-18 DIAGNOSIS — Z8249 Family history of ischemic heart disease and other diseases of the circulatory system: Secondary | ICD-10-CM | POA: Diagnosis not present

## 2017-02-18 DIAGNOSIS — I509 Heart failure, unspecified: Secondary | ICD-10-CM | POA: Diagnosis not present

## 2017-02-18 DIAGNOSIS — Z09 Encounter for follow-up examination after completed treatment for conditions other than malignant neoplasm: Secondary | ICD-10-CM | POA: Diagnosis not present

## 2017-02-18 DIAGNOSIS — Z86711 Personal history of pulmonary embolism: Secondary | ICD-10-CM | POA: Diagnosis not present

## 2017-02-18 DIAGNOSIS — Z794 Long term (current) use of insulin: Secondary | ICD-10-CM | POA: Diagnosis not present

## 2017-02-18 DIAGNOSIS — Z7982 Long term (current) use of aspirin: Secondary | ICD-10-CM | POA: Diagnosis not present

## 2017-02-18 DIAGNOSIS — E43 Unspecified severe protein-calorie malnutrition: Secondary | ICD-10-CM | POA: Diagnosis not present

## 2017-02-18 DIAGNOSIS — E118 Type 2 diabetes mellitus with unspecified complications: Secondary | ICD-10-CM | POA: Diagnosis not present

## 2017-02-18 DIAGNOSIS — F329 Major depressive disorder, single episode, unspecified: Secondary | ICD-10-CM | POA: Diagnosis not present

## 2017-02-18 DIAGNOSIS — I2699 Other pulmonary embolism without acute cor pulmonale: Secondary | ICD-10-CM | POA: Diagnosis not present

## 2017-02-18 DIAGNOSIS — I959 Hypotension, unspecified: Secondary | ICD-10-CM | POA: Diagnosis not present

## 2017-02-18 DIAGNOSIS — N289 Disorder of kidney and ureter, unspecified: Secondary | ICD-10-CM | POA: Diagnosis not present

## 2017-02-18 LAB — URINALYSIS, ROUTINE W REFLEX MICROSCOPIC
Bilirubin Urine: NEGATIVE
Glucose, UA: >=500 mg/dL — AB
KETONES UR: NEGATIVE mg/dL
NITRITE: NEGATIVE
PH: 6 (ref 5.0–8.0)
SQUAMOUS EPITHELIAL / LPF: NONE SEEN
Specific Gravity, Urine: 1.017 (ref 1.005–1.030)

## 2017-02-18 LAB — GLUCOSE, CAPILLARY
GLUCOSE-CAPILLARY: 102 mg/dL — AB (ref 65–99)
Glucose-Capillary: 379 mg/dL — ABNORMAL HIGH (ref 65–99)
Glucose-Capillary: 66 mg/dL (ref 65–99)
Glucose-Capillary: 99 mg/dL (ref 65–99)
Glucose-Capillary: 144 mg/dL — ABNORMAL HIGH (ref 65–99)

## 2017-02-18 LAB — SALICYLATE LEVEL: Salicylate Lvl: <7 mg/dL (ref 2.8–30.0)

## 2017-02-18 LAB — HEMOGLOBIN A1C
HEMOGLOBIN A1C: 10 % — AB (ref 4.8–5.6)
MEAN PLASMA GLUCOSE: 240.3 mg/dL

## 2017-02-18 LAB — ACETAMINOPHEN LEVEL

## 2017-02-18 MED ORDER — ASPIRIN 81 MG PO CHEW
81.0000 mg | CHEWABLE_TABLET | Freq: Every day | ORAL | Status: DC
Start: 2017-02-18 — End: 2017-02-28
  Administered 2017-02-18 – 2017-02-28 (×11): 81 mg via ORAL
  Filled 2017-02-18 (×11): qty 1

## 2017-02-18 MED ORDER — PHENAZOPYRIDINE HCL 100 MG PO TABS
200.0000 mg | ORAL_TABLET | Freq: Three times a day (TID) | ORAL | Status: DC
  Administered 2017-02-18 – 2017-02-23 (×15): 200 mg via ORAL
  Filled 2017-02-18 (×15): qty 2

## 2017-02-18 MED ORDER — METRONIDAZOLE IN NACL 5-0.79 MG/ML-% IV SOLN: 500.0000 mg | Freq: Three times a day (TID) | INTRAVENOUS | Status: DC

## 2017-02-18 MED ORDER — ISOSORBIDE MONONITRATE ER 30 MG PO TB24
30.0000 mg | ORAL_TABLET | Freq: Every day | ORAL | Status: DC
  Administered 2017-02-18 – 2017-02-19 (×2): 30 mg via ORAL
  Filled 2017-02-18 (×2): qty 1

## 2017-02-18 MED ORDER — HYDRALAZINE HCL 25 MG PO TABS
25.0000 mg | ORAL_TABLET | Freq: Three times a day (TID) | ORAL | Status: DC
  Administered 2017-02-18 – 2017-02-19 (×3): 25 mg via ORAL
  Filled 2017-02-18 (×3): qty 1

## 2017-02-18 MED ORDER — CARVEDILOL 3.125 MG PO TABS
3.1250 mg | ORAL_TABLET | Freq: Two times a day (BID) | ORAL | Status: DC
  Administered 2017-02-18 – 2017-02-19 (×3): 3.125 mg via ORAL
  Filled 2017-02-18 (×5): qty 1

## 2017-02-18 MED ORDER — TRAZODONE HCL 50 MG PO TABS
100.0000 mg | ORAL_TABLET | Freq: Every day | ORAL | Status: DC
  Administered 2017-02-18 – 2017-02-27 (×10): 100 mg via ORAL
  Filled 2017-02-18 (×10): qty 2

## 2017-02-18 MED ORDER — MIDAZOLAM HCL 2 MG/2ML IJ SOLN
INTRAMUSCULAR | Status: AC | PRN
  Administered 2017-02-18: 0.5 mg via INTRAVENOUS

## 2017-02-18 MED ORDER — DIGOXIN 125 MCG PO TABS
0.1250 mg | ORAL_TABLET | Freq: Every day | ORAL | Status: DC
Start: 2017-02-18 — End: 2017-02-28
  Administered 2017-02-18 – 2017-02-28 (×11): 0.125 mg via ORAL
  Filled 2017-02-18 (×12): qty 1

## 2017-02-18 MED ORDER — IOPAMIDOL (ISOVUE-300) INJECTION 61%
INTRAVENOUS | Status: AC
  Administered 2017-02-18: 75 mL
  Filled 2017-02-18: qty 75

## 2017-02-18 MED ORDER — SERTRALINE HCL 100 MG PO TABS
100.0000 mg | ORAL_TABLET | Freq: Every day | ORAL | Status: DC
  Administered 2017-02-18 – 2017-02-27 (×10): 100 mg via ORAL
  Filled 2017-02-18 (×11): qty 1

## 2017-02-18 MED ORDER — DEXTROSE 5 % IV SOLN
2.0000 g | INTRAVENOUS | Status: DC
  Administered 2017-02-18 – 2017-02-20 (×3): 2 g via INTRAVENOUS
  Filled 2017-02-18 (×3): qty 2

## 2017-02-18 MED ORDER — ATORVASTATIN CALCIUM 20 MG PO TABS
20.0000 mg | ORAL_TABLET | Freq: Every day | ORAL | Status: DC
  Administered 2017-02-18 – 2017-02-27 (×10): 20 mg via ORAL
  Filled 2017-02-18 (×10): qty 1

## 2017-02-18 MED ORDER — ACETAMINOPHEN 650 MG RE SUPP: 650.0000 mg | Freq: Four times a day (QID) | RECTAL | Status: DC | PRN

## 2017-02-18 MED ORDER — INSULIN ASPART 100 UNIT/ML ~~LOC~~ SOLN
0.0000 [IU] | Freq: Three times a day (TID) | SUBCUTANEOUS | Status: DC
  Administered 2017-02-18: 9 [IU] via SUBCUTANEOUS
  Administered 2017-02-18: 1 [IU] via SUBCUTANEOUS
  Administered 2017-02-19: 2 [IU] via SUBCUTANEOUS
  Administered 2017-02-19: 3 [IU] via SUBCUTANEOUS
  Administered 2017-02-20 (×2): 2 [IU] via SUBCUTANEOUS
  Administered 2017-02-20: 5 [IU] via SUBCUTANEOUS
  Administered 2017-02-21: 2 [IU] via SUBCUTANEOUS
  Administered 2017-02-21 – 2017-02-22 (×2): 3 [IU] via SUBCUTANEOUS
  Administered 2017-02-22: 5 [IU] via SUBCUTANEOUS
  Administered 2017-02-23 (×2): 1 [IU] via SUBCUTANEOUS
  Administered 2017-02-25: 2 [IU] via SUBCUTANEOUS
  Administered 2017-02-25: 1 [IU] via SUBCUTANEOUS
  Administered 2017-02-25 – 2017-02-26 (×2): 2 [IU] via SUBCUTANEOUS
  Administered 2017-02-27: 1 [IU] via SUBCUTANEOUS
  Administered 2017-02-27 (×2): 2 [IU] via SUBCUTANEOUS

## 2017-02-18 MED ORDER — SACUBITRIL-VALSARTAN 24-26 MG PO TABS
1.0000 | ORAL_TABLET | Freq: Two times a day (BID) | ORAL | Status: DC
  Administered 2017-02-18 – 2017-02-19 (×3): 1 via ORAL
  Filled 2017-02-18 (×3): qty 1

## 2017-02-18 MED ORDER — FUROSEMIDE 40 MG PO TABS
40.0000 mg | ORAL_TABLET | Freq: Two times a day (BID) | ORAL | Status: DC
  Administered 2017-02-18 – 2017-02-19 (×3): 40 mg via ORAL
  Filled 2017-02-18 (×3): qty 1

## 2017-02-18 MED ORDER — VANCOMYCIN HCL IN DEXTROSE 1-5 GM/200ML-% IV SOLN
1000.0000 mg | Freq: Once | INTRAVENOUS | Status: AC
  Administered 2017-02-18: 1000 mg via INTRAVENOUS
  Filled 2017-02-18: qty 200

## 2017-02-18 MED ORDER — LIDOCAINE HCL 1 % IJ SOLN
INTRAMUSCULAR | Status: AC
  Filled 2017-02-18: qty 20

## 2017-02-18 MED ORDER — ARIPIPRAZOLE 5 MG PO TABS
5.0000 mg | ORAL_TABLET | Freq: Every day | ORAL | Status: DC
  Administered 2017-02-18 – 2017-02-28 (×11): 5 mg via ORAL
  Filled 2017-02-18 (×11): qty 1

## 2017-02-18 MED ORDER — MIDAZOLAM HCL 2 MG/2ML IJ SOLN
INTRAMUSCULAR | Status: AC
  Filled 2017-02-18: qty 4

## 2017-02-18 MED ORDER — ACETAMINOPHEN 325 MG PO TABS
650.0000 mg | ORAL_TABLET | Freq: Four times a day (QID) | ORAL | Status: DC | PRN
  Administered 2017-02-19 – 2017-02-28 (×3): 650 mg via ORAL
  Filled 2017-02-18 (×4): qty 2

## 2017-02-18 MED ORDER — INSULIN GLARGINE 100 UNIT/ML ~~LOC~~ SOLN
20.0000 [IU] | Freq: Every day | SUBCUTANEOUS | Status: DC
  Administered 2017-02-18 – 2017-02-19 (×2): 20 [IU] via SUBCUTANEOUS
  Filled 2017-02-18 (×2): qty 0.2

## 2017-02-18 MED ORDER — PIPERACILLIN-TAZOBACTAM 3.375 G IVPB 30 MIN
3.3750 g | Freq: Once | INTRAVENOUS | Status: AC
  Administered 2017-02-18: 3.375 g via INTRAVENOUS
  Filled 2017-02-18: qty 50

## 2017-02-18 MED ORDER — FENTANYL CITRATE (PF) 100 MCG/2ML IJ SOLN
INTRAMUSCULAR | Status: AC | PRN
  Administered 2017-02-18 (×3): 25 ug via INTRAVENOUS

## 2017-02-18 MED ORDER — METRONIDAZOLE IN NACL 5-0.79 MG/ML-% IV SOLN
500.0000 mg | Freq: Once | INTRAVENOUS | Status: AC
  Administered 2017-02-18: 500 mg via INTRAVENOUS
  Filled 2017-02-18: qty 100

## 2017-02-18 MED ORDER — BUPROPION HCL ER (XL) 150 MG PO TB24
150.0000 mg | ORAL_TABLET | Freq: Every day | ORAL | Status: DC
  Administered 2017-02-18 – 2017-02-28 (×11): 150 mg via ORAL
  Filled 2017-02-18 (×11): qty 1

## 2017-02-18 MED ORDER — POTASSIUM CHLORIDE CRYS ER 20 MEQ PO TBCR
40.0000 meq | EXTENDED_RELEASE_TABLET | Freq: Every day | ORAL | Status: DC
  Administered 2017-02-18 – 2017-02-19 (×2): 40 meq via ORAL
  Filled 2017-02-18 (×2): qty 2

## 2017-02-18 MED ORDER — DEXTROSE 50 % IV SOLN
INTRAVENOUS | Status: AC
  Administered 2017-02-18: 25 mL
  Filled 2017-02-18: qty 50

## 2017-02-18 MED ORDER — OXYCODONE HCL 5 MG PO TABS
5.0000 mg | ORAL_TABLET | Freq: Four times a day (QID) | ORAL | Status: DC | PRN
  Administered 2017-02-18 – 2017-02-27 (×16): 5 mg via ORAL
  Filled 2017-02-18 (×16): qty 1

## 2017-02-18 MED ORDER — SODIUM CHLORIDE 0.9 % IV SOLN
INTRAVENOUS | Status: AC | PRN
  Administered 2017-02-18: 10 mL/h via INTRAVENOUS

## 2017-02-18 MED ORDER — GLUCOSE 40 % PO GEL
ORAL | Status: DC
Start: 2017-02-18 — End: 2017-02-18
  Filled 2017-02-18: qty 1

## 2017-02-18 MED ORDER — SODIUM CHLORIDE 0.9 % IV SOLN
INTRAVENOUS | Status: AC
  Administered 2017-02-18: 09:00:00 via INTRAVENOUS

## 2017-02-18 MED ORDER — FENTANYL CITRATE (PF) 100 MCG/2ML IJ SOLN
INTRAMUSCULAR | Status: AC
  Filled 2017-02-18: qty 4

## 2017-02-18 NOTE — Consult Note (Signed)
Urology Consult   Physician requesting consult: April Palumbo, MD  Reason for consult: Possible prostate abscess  History of Present Illness: [name] is a 61 y.o. who [facility] with a 1 week history of diarrhea/bowel incontinence, a 2-3 day history of dysuria, urinary urgency/frequency and incomplete bladder emptying and general malaise.  He had a non-con CT of the abdomen/pelvis that demonstrated a large (~5 cm fluid collection within the prostate concerning for a prostatic abscess as well as possible colitis.   Currently, he states that he is only able to void small amounts and is having some incontinence between voids, which is new for him.    He denies a history of voiding or storage urinary symptoms, hematuria, UTIs, STDs, GU malignancy/trauma/surgery or fever/chills.  Remote history of nephrolithiasis ~30 years ago.    Past Medical History:  Diagnosis Date  . CHF (congestive heart failure) (HCC)   . Coronary artery disease   . Depression   . Diabetes mellitus   . Hypertension   . Mental disorder   . MI (myocardial infarction) (HCC) 391984   age 61 per patient    Past Surgical History:  Procedure Laterality Date  . ANGIOPLASTY  1984   at age 61  . LEFT AND RIGHT HEART CATHETERIZATION WITH CORONARY/GRAFT ANGIOGRAM  08/13/2012   Procedure: LEFT AND RIGHT HEART CATHETERIZATION WITH Isabel CapriceORONARY/GRAFT ANGIOGRAM;  Surgeon: Kathleene Hazelhristopher D McAlhany, MD;  [facility];  Service: Cardiovascular;;    Current Hospital Medications:  Home Meds:  Current Meds  Medication Sig  . acetaminophen (TYLENOL) 325 MG tablet Take 650 mg by mouth every 6 (six) hours as needed for mild pain.  . ARIPiprazole (ABILIFY) 10 MG tablet Take 5 mg by mouth daily.  Marland Kitchen. aspirin 81 MG chewable tablet Chew 1 tablet (81 mg total) by mouth daily.  Marland Kitchen. atorvastatin (LIPITOR) 20 MG tablet Take 1 tablet (20 mg total) by mouth daily at 6 PM.  . buPROPion (WELLBUTRIN XL) 150 MG 24 hr tablet Take 1  tablet (150 mg total) by mouth daily.  . carvedilol (COREG) 3.125 MG tablet Take 1 tablet (3.125 mg total) by mouth 2 (two) times daily.  . digoxin (LANOXIN) 0.125 MG tablet Take 1 tablet (0.125 mg total) by mouth daily.  . furosemide (LASIX) 40 MG tablet Take 40 mg by mouth 2 (two) times daily.  . hydrALAZINE (APRESOLINE) 25 MG tablet Take 1 tablet (25 mg total) by mouth 3 (three) times daily.  . insulin glargine (LANTUS) 100 UNIT/ML injection Inject 20 units into skin each night. Check BG daily. If CBG>300 can take 40 units. (Patient taking differently: Inject 20-40 Units into the skin daily as needed (high blood sugar). )  . isosorbide mononitrate (IMDUR) 30 MG 24 hr tablet Take 1 tablet (30 mg total) by mouth daily.  . metFORMIN (GLUCOPHAGE) 1000 MG tablet Take 1 tablet (1,000 mg total) by mouth 2 (two) times daily with a meal. For high blood sugar control  . potassium chloride SA (K-DUR,KLOR-CON) 20 MEQ tablet Take 2 tablets (40 mEq total) by mouth daily.  . rivaroxaban (XARELTO) 20 MG TABS tablet Take 1 tablet (20 mg total) by mouth daily with supper.  . sacubitril-valsartan (ENTRESTO) 24-26 MG Take 1 tablet by mouth 2 (two) times daily.  . sertraline (ZOLOFT) 100 MG tablet Take 100 mg by mouth at bedtime.   . traZODone (DESYREL) 100 MG tablet Take 1 tablet (100 mg total) by mouth at bedtime. For sleep  Scheduled Meds: . ARIPiprazole  5 mg Oral Daily  . aspirin  81 mg Oral Daily  . atorvastatin  20 mg Oral q1800  . buPROPion  150 mg Oral Daily  . carvedilol  3.125 mg Oral BID  . digoxin  0.125 mg Oral Daily  . furosemide  40 mg Oral BID  . hydrALAZINE  25 mg Oral TID  . insulin aspart  0-9 Units Subcutaneous TID WC  . insulin glargine  20 Units Subcutaneous Daily  . isosorbide mononitrate  30 mg Oral Daily  . phenazopyridine  200 mg Oral TID WC  . potassium chloride SA  40 mEq Oral Daily  . sacubitril-valsartan  1 tablet Oral BID  . sertraline  100 mg Oral QHS  . traZODone  100  mg Oral QHS   Continuous Infusions: . sodium chloride 50 mL/hr at 02/18/17 0920  . cefTRIAXone (ROCEPHIN)  IV 2 g (02/18/17 1110)  . metronidazole     PRN Meds:.acetaminophen **OR** acetaminophen, oxyCODONE  Allergies: No Known Allergies  Family History  Problem Relation Age of Onset  . Heart attack Father        >60s  . Hypertension Father   . Diabetes Father   . Heart attack Sister   . Hypertension Mother     Social History:  reports that he has been smoking Cigarettes.  He has a 20.00 pack-year smoking history. He has never used smokeless tobacco. He reports that he drinks alcohol. He reports that he uses drugs, including Marijuana and "Crack" cocaine, about 7 times per week.  ROS: A complete review of systems was performed.  All systems are negative except for pertinent findings as noted.  Physical Exam:  Vital signs in last 24 hours: Temp:  [97.4 F (36.3 C)] 97.4 F (36.3 C) (10/26 2138) Pulse Rate:  [85-93] 89 (10/27 0800) Resp:  [18] 18 (10/27 0400) BP: (116-144)/(66-96) 127/87 (10/27 0800) SpO2:  [89 %-100 %] 98 % (10/27 0800) Weight:  [72.6 kg (160 lb)] 72.6 kg (160 lb) (10/26 2140) Constitutional:  Alert and oriented, No acute distress Cardiovascular: Regular rate and rhythm, No JVD Respiratory: Normal respiratory effort, Lungs clear bilaterally GI: Abdomen is soft, nontender, nondistended, no abdominal masses GU: No CVA tenderness, Foley catheter placed with return of cloudy, yellow urine.  The penis is uncircumcised with no lesions.  Both testicles are descended and demonstrate no mass/nodules. Lymphatic: No lymphadenopathy Neurologic: Grossly intact, no focal deficits Psychiatric: Normal mood and affect  Laboratory Data:   Recent Labs  02/17/17 2148  WBC 9.4  HGB 13.2  HCT 37.4*  PLT 398     Recent Labs  02/17/17 2148  NA 128*  K 4.0  CL 97*  GLUCOSE 435*  BUN 22*  CALCIUM 8.4*  CREATININE 1.55*     Results for orders placed or  performed during the hospital encounter of 02/17/17 (from the past 24 hour(s))  Lipase, blood     Status: Abnormal   Collection Time: 02/17/17  9:48 PM  Result Value Ref Range   Lipase 92 (H) 11 - 51 U/L  Comprehensive metabolic panel     Status: Abnormal   Collection Time: 02/17/17  9:48 PM  Result Value Ref Range   Sodium 128 (L) 135 - 145 mmol/L   Potassium 4.0 3.5 - 5.1 mmol/L   Chloride 97 (L) 101 - 111 mmol/L   CO2 17 (L) 22 - 32 mmol/L   Glucose, Bld 435 (H) 65 - 99 mg/dL   BUN  22 (H) 6 - 20 mg/dL   Creatinine, Ser 1.61 (H) 0.61 - 1.24 mg/dL   Calcium 8.4 (L) 8.9 - 10.3 mg/dL   Total Protein 8.3 (H) 6.5 - 8.1 g/dL   Albumin 2.2 (L) 3.5 - 5.0 g/dL   AST 21 15 - 41 U/L   ALT 14 (L) 17 - 63 U/L   Alkaline Phosphatase 126 38 - 126 U/L   Total Bilirubin 0.8 0.3 - 1.2 mg/dL   GFR calc non Af Amer 46 (L) >60 mL/min   GFR calc Af Amer 54 (L) >60 mL/min   Anion gap 14 5 - 15  CBC     Status: Abnormal   Collection Time: 02/17/17  9:48 PM  Result Value Ref Range   WBC 9.4 4.0 - 10.5 K/uL   RBC 4.48 4.22 - 5.81 MIL/uL   Hemoglobin 13.2 13.0 - 17.0 g/dL   HCT 09.6 (L) 04.5 - 40.9 %   MCV 83.5 78.0 - 100.0 fL   MCH 29.5 26.0 - 34.0 pg   MCHC 35.3 30.0 - 36.0 g/dL   RDW 81.1 91.4 - 78.2 %   Platelets 398 150 - 400 K/uL  Acetaminophen level     Status: Abnormal   Collection Time: 02/17/17 11:42 PM  Result Value Ref Range   Acetaminophen (Tylenol), Serum <10 (L) 10 - 30 ug/mL  Salicylate level     Status: None   Collection Time: 02/17/17 11:42 PM  Result Value Ref Range   Salicylate Lvl <7.0 2.8 - 30.0 mg/dL  Urinalysis, Routine w reflex microscopic     Status: Abnormal   Collection Time: 02/18/17  3:11 AM  Result Value Ref Range   Color, Urine YELLOW YELLOW   APPearance TURBID (A) CLEAR   Specific Gravity, Urine 1.017 1.005 - 1.030   pH 6.0 5.0 - 8.0   Glucose, UA >=500 (A) NEGATIVE mg/dL   Hgb urine dipstick MODERATE (A) NEGATIVE   Bilirubin Urine NEGATIVE NEGATIVE    Ketones, ur NEGATIVE NEGATIVE mg/dL   Protein, ur >=956 (A) NEGATIVE mg/dL   Nitrite NEGATIVE NEGATIVE   Leukocytes, UA LARGE (A) NEGATIVE   RBC / HPF 6-30 0 - 5 RBC/hpf   WBC, UA 6-30 0 - 5 WBC/hpf   Bacteria, UA MANY (A) NONE SEEN   Squamous Epithelial / LPF NONE SEEN NONE SEEN   WBC Clumps PRESENT    Mucus PRESENT    Hyaline Casts, UA PRESENT   Glucose, capillary     Status: Abnormal   Collection Time: 02/18/17  9:11 AM  Result Value Ref Range   Glucose-Capillary 379 (H) 65 - 99 mg/dL  Hemoglobin O1H     Status: Abnormal   Collection Time: 02/18/17  9:22 AM  Result Value Ref Range   Hgb A1c MFr Bld 10.0 (H) 4.8 - 5.6 %   Mean Plasma Glucose 240.3 mg/dL   No results found for this or any previous visit (from the past 240 hour(s)).  Renal Function:  Recent Labs  02/17/17 2148  CREATININE 1.55*   Estimated Creatinine Clearance: 50.7 mL/min (A) (by C-G formula based on SCr of 1.55 mg/dL (H)).  Radiologic Imaging: Ct Renal Stone Study  Result Date: 02/18/2017 CLINICAL DATA:  Acute onset of generalized weakness and loss of appetite. Incontinence for 5 days. Initial encounter. EXAM: CT ABDOMEN AND PELVIS WITHOUT CONTRAST TECHNIQUE: Multidetector CT imaging of the abdomen and pelvis was performed following the standard protocol without IV contrast. COMPARISON:  Abdominal ultrasound performed 08/08/2016,  and CT of the abdomen and pelvis performed 10/23/2008 FINDINGS: Lower chest: Small bilateral pleural effusions are noted. Mild hazy bibasilar opacities may reflect interstitial edema. The visualized portions of the mediastinum are unremarkable. Hepatobiliary: The liver is unremarkable in appearance. The gallbladder is unremarkable in appearance. The common bile duct remains normal in caliber. Pancreas: The pancreas is within normal limits. Spleen: The spleen is unremarkable in appearance. Adrenals/Urinary Tract: The adrenal glands are unremarkable in appearance. There is wall thickening  along the left renal pelvis, with minimal left-sided hydronephrosis. No distal obstructing stone is seen. This is thought to reflect left-sided ureteritis. Mild nonspecific perinephric stranding is noted bilaterally. The right kidney is unremarkable in appearance. No renal or ureteral stones are identified. Stomach/Bowel: The appendix is distended to 1.2 cm in maximal diameter, with mild surrounding inflammation. Acute appendicitis cannot be excluded, though this may simply be reactive given the pelvic process. Wall thickening is noted along the ascending colon, concerning for an acute infectious or inflammatory process. Wall thickening is also noted at the rectum, with diffuse presacral stranding, concerning for proctitis. The small bowel is grossly unremarkable. The stomach is decompressed unremarkable in appearance. Vascular/Lymphatic: Scattered calcification is seen along the abdominal aorta and its branches. The abdominal aorta is otherwise grossly unremarkable. The inferior vena cava is grossly unremarkable. No retroperitoneal lymphadenopathy is seen. No pelvic sidewall lymphadenopathy is identified. Reproductive: There is diffuse bladder wall thickening, with surrounding soft tissue inflammation, concerning for cystitis. Note is made of an apparent large abscess occupying the prostate, measuring 6.0 x 5.4 x 4.2 cm. This difficult to fully assess without contrast. Surrounding soft tissue inflammation is noted. Other: No additional soft tissue abnormalities are seen. Musculoskeletal: No acute osseous abnormalities are identified. The visualized musculature is unremarkable in appearance. IMPRESSION: 1. Apparent large abscess occupying the prostate, measuring 6.0 x 5.4 x 4.2 cm. Prostatic abscesses are rare, typically a complication of acute bacterial prostatitis. Surrounding soft tissue inflammation noted. 2. Bladder wall thickening, with surrounding soft tissue inflammation, concerning for cystitis. 3. Wall  thickening at the rectum, with diffuse presacral stranding, concerning for associated proctitis. 4. Wall thickening along the ascending colon, concerning for an acute infectious or inflammatory process. 5. Distention of the appendix to 1.2 cm in maximal diameter, with mild surrounding inflammation. Acute appendicitis cannot be excluded, though this may simply reflect the underlying pelvic infection. 6. Wall thickening along the left renal pelvis, with minimal left-sided hydronephrosis, thought to reflect left-sided ureteritis. No obstructing stone seen. 7. Small bilateral pleural effusions. Mild hazy bibasilar airspace opacities may reflect minimal interstitial edema. These results were called by telephone at the time of interpretation on 02/18/2017 at 4:45 am to Dr. Cy Blamer, who verbally acknowledged these results. Electronically Signed   By: Roanna Raider M.D.   On: 02/18/2017 04:47    I independently reviewed the above imaging studies.  Impression/Recommendation Prostatic abscess Colitis  -CT abd/pel with contrast pending.  Pending the results of his contrasted CT, Dr. Eustaquio Maize with IR will decide if percutaneous drainage of the patient's prostatic fluid collection is feasible.  If not, will plan on transurethral unroofing of the fluid collection tomorrow.  The risks of bactermia/sepsis via the transurethral route was discussed with the patient.  Continue IV Rocephin blood cultures pending.  No urine culture ordered prior to the patient receiving antibiotics.   -Will continue to follow  Dorian Furnace Rustin Erhart, MD 02/18/2017, 11:28 AM

## 2017-02-18 NOTE — Consult Note (Signed)
Reason for Consult: Prostatic abscess with secondary inflammation of the appendix and colon Referring Physician: April Palumbo  Crayton Savarese [name] is an 61 y.o. male.  HPI: [name] presented with a 5-day history of suprapubic abdominal pain, burning with urination, and diarrhea.  He has multiple medical problems including congestive heart failure, hypertension, and coronary artery disease.  CT scan revealed a large prostatic abscess.  There is some secondary inflammation of the sigmoid colon and appendix due to this pelvic abscess.  Past Medical History:  Diagnosis Date  . CHF (congestive heart failure) ([name])   . Coronary artery disease   . Depression   . Diabetes mellitus   . Hypertension   . Mental disorder   . MI (myocardial infarction) ([name]) 51   age 60 per patient    Past Surgical History:  Procedure Laterality Date  . ANGIOPLASTY  1984   at age 17  . LEFT AND RIGHT HEART CATHETERIZATION WITH CORONARY/GRAFT ANGIOGRAM  08/13/2012   Procedure: LEFT AND RIGHT HEART CATHETERIZATION WITH [name];  Surgeon: Burnell Blanks, MD;  Location: [facility] CATH LAB;  Service: Cardiovascular;;    Family History  Problem Relation Age of Onset  . Heart attack Father        >60s  . Hypertension Father   . Diabetes Father   . Heart attack Sister   . Hypertension Mother     Social History:  reports that he has been smoking Cigarettes.  He has a 20.00 pack-year smoking history. He has never used smokeless tobacco. He reports that he drinks alcohol. He reports that he uses drugs, including Marijuana and "Crack" cocaine, about 7 times per week.  Allergies: No Known Allergies  Medications: Prior to Admission:  (Not in a hospital admission)  Results for orders placed or performed during the hospital encounter of 02/17/17 (from the past 48 hour(s))  Lipase, blood     Status: Abnormal   Collection Time: 02/17/17  9:48 PM  Result Value Ref Range   Lipase 92 (H) 11 - 51  U/L  Comprehensive metabolic panel     Status: Abnormal   Collection Time: 02/17/17  9:48 PM  Result Value Ref Range   Sodium 128 (L) 135 - 145 mmol/L   Potassium 4.0 3.5 - 5.1 mmol/L   Chloride 97 (L) 101 - 111 mmol/L   CO2 17 (L) 22 - 32 mmol/L   Glucose, Bld 435 (H) 65 - 99 mg/dL   BUN 22 (H) 6 - 20 mg/dL   Creatinine, Ser 1.55 (H) 0.61 - 1.24 mg/dL   Calcium 8.4 (L) 8.9 - 10.3 mg/dL   Total Protein 8.3 (H) 6.5 - 8.1 g/dL   Albumin 2.2 (L) 3.5 - 5.0 g/dL   AST 21 15 - 41 U/L   ALT 14 (L) 17 - 63 U/L   Alkaline Phosphatase 126 38 - 126 U/L   Total Bilirubin 0.8 0.3 - 1.2 mg/dL   GFR calc non Af Amer 46 (L) >60 mL/min   GFR calc Af Amer 54 (L) >60 mL/min    Comment: (NOTE) The eGFR has been calculated using the CKD EPI equation. This calculation has not been validated in all clinical situations. eGFR's persistently <60 mL/min signify possible Chronic Kidney Disease.    Anion gap 14 5 - 15  CBC     Status: Abnormal   Collection Time: 02/17/17  9:48 PM  Result Value Ref Range   WBC 9.4 4.0 - 10.5 K/uL   RBC 4.48  4.22 - 5.81 MIL/uL   Hemoglobin 13.2 13.0 - 17.0 g/dL   HCT 37.4 (L) 39.0 - 52.0 %   MCV 83.5 78.0 - 100.0 fL   MCH 29.5 26.0 - 34.0 pg   MCHC 35.3 30.0 - 36.0 g/dL   RDW 12.7 11.5 - 15.5 %   Platelets 398 150 - 400 K/uL  Acetaminophen level     Status: Abnormal   Collection Time: 02/17/17 11:42 PM  Result Value Ref Range   Acetaminophen (Tylenol), Serum <10 (L) 10 - 30 ug/mL    Comment:        THERAPEUTIC CONCENTRATIONS VARY SIGNIFICANTLY. A RANGE OF 10-30 ug/mL MAY BE AN EFFECTIVE CONCENTRATION FOR MANY PATIENTS. HOWEVER, SOME ARE BEST TREATED AT CONCENTRATIONS OUTSIDE THIS RANGE. ACETAMINOPHEN CONCENTRATIONS >150 ug/mL AT 4 HOURS AFTER INGESTION AND >50 ug/mL AT 12 HOURS AFTER INGESTION ARE OFTEN ASSOCIATED WITH TOXIC REACTIONS.   Salicylate level     Status: None   Collection Time: 02/17/17 11:42 PM  Result Value Ref Range   Salicylate Lvl <4.9  2.8 - 30.0 mg/dL  Urinalysis, Routine w reflex microscopic     Status: Abnormal   Collection Time: 02/18/17  3:11 AM  Result Value Ref Range   Color, Urine YELLOW YELLOW   APPearance TURBID (A) CLEAR   Specific Gravity, Urine 1.017 1.005 - 1.030   pH 6.0 5.0 - 8.0   Glucose, UA >=500 (A) NEGATIVE mg/dL   Hgb urine dipstick MODERATE (A) NEGATIVE   Bilirubin Urine NEGATIVE NEGATIVE   Ketones, ur NEGATIVE NEGATIVE mg/dL   Protein, ur >=300 (A) NEGATIVE mg/dL   Nitrite NEGATIVE NEGATIVE   Leukocytes, UA LARGE (A) NEGATIVE   RBC / HPF 6-30 0 - 5 RBC/hpf   WBC, UA 6-30 0 - 5 WBC/hpf   Bacteria, UA MANY (A) NONE SEEN   Squamous Epithelial / LPF NONE SEEN NONE SEEN   WBC Clumps PRESENT    Mucus PRESENT    Hyaline Casts, UA PRESENT     Ct Renal Stone Study  Result Date: 02/18/2017 CLINICAL DATA:  Acute onset of generalized weakness and loss of appetite. Incontinence for 5 days. Initial encounter. EXAM: CT ABDOMEN AND PELVIS WITHOUT CONTRAST TECHNIQUE: Multidetector CT imaging of the abdomen and pelvis was performed following the standard protocol without IV contrast. COMPARISON:  Abdominal ultrasound performed 08/08/2016, and CT of the abdomen and pelvis performed 10/23/2008 FINDINGS: Lower chest: Small bilateral pleural effusions are noted. Mild hazy bibasilar opacities may reflect interstitial edema. The visualized portions of the mediastinum are unremarkable. Hepatobiliary: The liver is unremarkable in appearance. The gallbladder is unremarkable in appearance. The common bile duct remains normal in caliber. Pancreas: The pancreas is within normal limits. Spleen: The spleen is unremarkable in appearance. Adrenals/Urinary Tract: The adrenal glands are unremarkable in appearance. There is wall thickening along the left renal pelvis, with minimal left-sided hydronephrosis. No distal obstructing stone is seen. This is thought to reflect left-sided ureteritis. Mild nonspecific perinephric stranding is  noted bilaterally. The right kidney is unremarkable in appearance. No renal or ureteral stones are identified. Stomach/Bowel: The appendix is distended to 1.2 cm in maximal diameter, with mild surrounding inflammation. Acute appendicitis cannot be excluded, though this may simply be reactive given the pelvic process. Wall thickening is noted along the ascending colon, concerning for an acute infectious or inflammatory process. Wall thickening is also noted at the rectum, with diffuse presacral stranding, concerning for proctitis. The small bowel is grossly unremarkable. The stomach is decompressed  unremarkable in appearance. Vascular/Lymphatic: Scattered calcification is seen along the abdominal aorta and its branches. The abdominal aorta is otherwise grossly unremarkable. The inferior vena cava is grossly unremarkable. No retroperitoneal lymphadenopathy is seen. No pelvic sidewall lymphadenopathy is identified. Reproductive: There is diffuse bladder wall thickening, with surrounding soft tissue inflammation, concerning for cystitis. Note is made of an apparent large abscess occupying the prostate, measuring 6.0 x 5.4 x 4.2 cm. This difficult to fully assess without contrast. Surrounding soft tissue inflammation is noted. Other: No additional soft tissue abnormalities are seen. Musculoskeletal: No acute osseous abnormalities are identified. The visualized musculature is unremarkable in appearance. IMPRESSION: 1. Apparent large abscess occupying the prostate, measuring 6.0 x 5.4 x 4.2 cm. Prostatic abscesses are rare, typically a complication of acute bacterial prostatitis. Surrounding soft tissue inflammation noted. 2. Bladder wall thickening, with surrounding soft tissue inflammation, concerning for cystitis. 3. Wall thickening at the rectum, with diffuse presacral stranding, concerning for associated proctitis. 4. Wall thickening along the ascending colon, concerning for an acute infectious or inflammatory  process. 5. Distention of the appendix to 1.2 cm in maximal diameter, with mild surrounding inflammation. Acute appendicitis cannot be excluded, though this may simply reflect the underlying pelvic infection. 6. Wall thickening along the left renal pelvis, with minimal left-sided hydronephrosis, thought to reflect left-sided ureteritis. No obstructing stone seen. 7. Small bilateral pleural effusions. Mild hazy bibasilar airspace opacities may reflect minimal interstitial edema. These results were called by telephone at the time of interpretation on 02/18/2017 at 4:45 am to Dr. Veatrice Kells, who verbally acknowledged these results. Electronically Signed   By: Garald Balding M.D.   On: 02/18/2017 04:47    Review of Systems  Constitutional: Positive for chills.  HENT: Negative.   Eyes: Negative for blurred vision.  Respiratory: Negative for cough and shortness of breath.   Cardiovascular: Negative for chest pain.  Gastrointestinal: Positive for abdominal pain and nausea. Negative for vomiting.  Genitourinary: Positive for dysuria.  Musculoskeletal: Negative.   Skin: Negative.   Neurological: Negative.   Endo/Heme/Allergies: Negative.   Psychiatric/Behavioral:       Alcohol abuse   Blood pressure (!) 139/95, pulse 93, temperature (!) 97.4 F (36.3 C), temperature source Oral, resp. rate 18, height 6' 2.5" (1.892 m), weight 72.6 kg (160 lb), SpO2 99 %. Physical Exam  Constitutional: He is oriented to person, place, and time. He appears well-developed.  HENT:  Head: Normocephalic.  Right Ear: External ear normal.  Left Ear: External ear normal.  Nose: Nose normal.  Mouth/Throat: Oropharynx is clear and moist.  Eyes: Pupils are equal, round, and reactive to light. EOM are normal.  Neck: Neck supple.  Cardiovascular: Normal rate, regular rhythm and normal heart sounds.   Respiratory: Effort normal. No respiratory distress. He has no wheezes. He has rales.  Minimal rales  GI: Soft. He  exhibits no distension. There is tenderness. There is no rebound and no guarding.  Tender only in the suprapubic region, no significant right lower quadrant or left lower quadrant tenderness, no peritonitis generally  Musculoskeletal: Normal range of motion.  Neurological: He is alert and oriented to person, place, and time.  Psychiatric: He has a normal mood and affect.    Assessment/Plan: Severe prostatitis with large prostatic abscess -agree with IV antibiotics, interventional radiology for percutaneous drain and I recommend urology evaluation and follow-up.  Secondary inflammation to the appendix and sigmoid colon -no need for urgent surgical intervention.  This will resolve as the primary process is  treated.  General surgery is available as needed.  Nicolae Vasek E 02/18/2017, 7:49 AM

## 2017-02-18 NOTE — Progress Notes (Signed)
Patient ID: [name], male   DOB: 1956-02-16, 61 y.o.   MRN: [id number]   Request via phone from MD to Dr Deanne Coffer last pm regarding prostate abscess drain placement  Dr Deanne Coffer has reviewed imaging this am Shows ill defined prostate process Question if there is rectal involvement or fistula. Not a candidate for drain at this time No safe accessible window for drain  Please consider formal Urology consult

## 2017-02-18 NOTE — ED Notes (Signed)
Patient transported to CT 

## 2017-02-18 NOTE — ED Provider Notes (Signed)
[facility] EMERGENCY DEPARTMENT Provider Note   CSN: [id number] Arrival date & time: 02/17/17  2130     History   Chief Complaint Chief Complaint  Patient presents with  . loss of bowel control  . Back Pain  . Urinary Retention    HPI DVAUGHN FICKLE is a 61 y.o. male.  The history is provided by the patient.  Diarrhea   This is a new problem. The current episode started more than 2 days ago. The problem has not changed since onset.The stool consistency is described as watery. There has been no fever. Pertinent negatives include no vomiting and no chills. He has tried nothing for the symptoms. The treatment provided no relief. His past medical history does not include irritable bowel syndrome or recent abdominal surgery.  Dysuria   This is a new problem. The current episode started more than 2 days ago. The problem occurs every urination. The problem has not changed since onset.The quality of the pain is described as burning. The pain is moderate. There has been no fever. Pertinent negatives include no chills, no vomiting, no hematuria and no flank pain. He has tried nothing for the symptoms. His past medical history does not include kidney stones.  Also has been leaking urine.    Past Medical History:  Diagnosis Date  . CHF (congestive heart failure) (HCC)   . Coronary artery disease   . Depression   . Diabetes mellitus   . Hypertension   . Mental disorder   . MI (myocardial infarction) [facility]) 56   age 62 per patient    Patient Active Problem List   Diagnosis Date Noted  . Myocardial ischemia   . Pulmonary embolus (HCC)   . Cocaine abuse (HCC)   . Acute exacerbation of CHF (congestive heart failure) (HCC) 12/04/2016  . H/O noncompliance with medical treatment, presenting hazards to health 12/04/2016  . Chronic systolic CHF (congestive heart failure) (HCC) 12/04/2016  . Anxiety state   . Depression with anxiety 08/05/2016  . DNR (do not  resuscitate) discussion   . Palliative care encounter   . Polysubstance abuse (HCC)   . Acute on chronic combined systolic and diastolic heart failure (HCC) 09/07/2015  . Type 2 diabetes mellitus with complication (HCC)   . Hypertensive heart disease with chronic systolic congestive heart failure (HCC)   . 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 Alcohol abuse 12/21/2012  . Cocaine dependence (HCC) 12/21/2012  . Substance induced mood disorder (HCC) 12/21/2012  . Coronary artery disease 08/14/2012  . Cardiomyopathy, nonischemic (HCC) 08/14/2012    Past Surgical History:  Procedure Laterality Date  . ANGIOPLASTY  1984   at age 89  . LEFT AND RIGHT HEART CATHETERIZATION WITH CORONARY/GRAFT ANGIOGRAM  08/13/2012   Procedure: LEFT AND RIGHT HEART CATHETERIZATION WITH [name];  Surgeon: Kathleene Hazel, MD;  Location: [facility] CATH LAB;  Service: Cardiovascular;;       Home Medications    Prior to Admission medications   Medication Sig Start Date End Date Taking? Authorizing Provider  acetaminophen (TYLENOL) 325 MG tablet Take 650 mg by mouth every 6 (six) hours as needed for mild pain.   Yes [provider]  ARIPiprazole (ABILIFY) 10 MG tablet Take 5 mg by mouth daily.   Yes [provider]  aspirin 81 MG chewable tablet Chew 1 tablet (81 mg total) by mouth daily. 09/21/16  Yes Graciella Freer, PA-C  atorvastatin (LIPITOR) 20 MG tablet Take 1 tablet (20  mg total) by mouth daily at 6 PM. 09/21/16  Yes Tillery, Mariam Dollar, PA-C  buPROPion (WELLBUTRIN XL) 150 MG 24 hr tablet Take 1 tablet (150 mg total) by mouth daily. 12/06/16  Yes Nedrud, Jeanella Flattery, MD  carvedilol (COREG) 3.125 MG tablet Take 1 tablet (3.125 mg total) by mouth 2 (two) times daily. 12/06/16 12/06/17 Yes Valentino Nose, MD  digoxin (LANOXIN) 0.125 MG tablet Take 1 tablet (0.125 mg total) by mouth daily. 09/21/16  Yes Graciella Freer, PA-C  furosemide (LASIX) 40 MG tablet Take  40 mg by mouth 2 (two) times daily.   Yes [provider]  hydrALAZINE (APRESOLINE) 25 MG tablet Take 1 tablet (25 mg total) by mouth 3 (three) times daily. 09/21/16  Yes Graciella Freer, PA-C  insulin glargine (LANTUS) 100 UNIT/ML injection Inject 20 units into skin each night. Check BG daily. If CBG>300 can take 40 units. Patient taking differently: Inject 20-40 Units into the skin daily as needed (high blood sugar).  12/07/16  Yes NedrudJeanella Flattery, MD  isosorbide mononitrate (IMDUR) 30 MG 24 hr tablet Take 1 tablet (30 mg total) by mouth daily. 09/21/16  Yes Graciella Freer, PA-C  metFORMIN (GLUCOPHAGE) 1000 MG tablet Take 1 tablet (1,000 mg total) by mouth 2 (two) times daily with a meal. For high blood sugar control 08/13/16  Yes Maxie Barb, MD  potassium chloride SA (K-DUR,KLOR-CON) 20 MEQ tablet Take 2 tablets (40 mEq total) by mouth daily. 12/06/16  Yes NedrudJeanella Flattery, MD  rivaroxaban (XARELTO) 20 MG TABS tablet Take 1 tablet (20 mg total) by mouth daily with supper. 12/26/16  Yes Nedrud, Jeanella Flattery, MD  sacubitril-valsartan (ENTRESTO) 24-26 MG Take 1 tablet by mouth 2 (two) times daily. 10/19/16  Yes Laurey Morale, MD  sertraline (ZOLOFT) 100 MG tablet Take 100 mg by mouth at bedtime.    Yes [provider]  traZODone (DESYREL) 100 MG tablet Take 1 tablet (100 mg total) by mouth at bedtime. For sleep 12/27/12  Yes Sanjuana Kava, NP    Family History Family History  Problem Relation Age of Onset  . Heart attack Father        >60s  . Hypertension Father   . Diabetes Father   . Heart attack Sister   . Hypertension Mother     Social History Social History  Substance Use Topics  . Smoking status: Current Every Day Smoker    Packs/day: 0.50    Years: 40.00    Types: Cigarettes  . Smokeless tobacco: Never Used  . Alcohol use Yes     Comment: 4 40 oz beers daily     Allergies   Patient has no known allergies.   Review of Systems Review  of Systems  Constitutional: Negative for appetite change, chills and fever.  Respiratory: Negative for shortness of breath.   Cardiovascular: Negative for chest pain.  Gastrointestinal: Positive for diarrhea. Negative for abdominal distention and vomiting.  Genitourinary: Positive for difficulty urinating and dysuria. Negative for discharge, flank pain, hematuria and testicular pain.  All other systems reviewed and are negative.    Physical Exam Updated Vital Signs BP 116/76   Pulse 91   Temp (!) 97.4 F (36.3 C) (Oral)   Resp 18   Ht 6' 2.5" (1.892 m)   Wt 72.6 kg (160 lb)   SpO2 99%   BMI 20.27 kg/m   Physical Exam  Constitutional: He is oriented to person, place, and time. He appears well-developed and well-nourished. No  distress.  HENT:  Head: Normocephalic and atraumatic.  Mouth/Throat: No oropharyngeal exudate.  Eyes: Pupils are equal, round, and reactive to light. Conjunctivae are normal.  Neck: Normal range of motion.  Cardiovascular: Normal rate, regular rhythm, normal heart sounds and intact distal pulses.   Pulmonary/Chest: Effort normal and breath sounds normal. He has no wheezes. He has no rales.  Abdominal: Soft. Bowel sounds are normal. He exhibits distension. He exhibits no mass. There is no tenderness. There is no rebound and no guarding.  Musculoskeletal: Normal range of motion. He exhibits no deformity.  Neurological: He is alert and oriented to person, place, and time. He displays normal reflexes. He exhibits normal muscle tone.  5/5 intact perineal sensation  Skin: Skin is warm and dry. Capillary refill takes less than 2 seconds.  Psychiatric: He has a normal mood and affect.     ED Treatments / Results   Vitals:   02/18/17 0330 02/18/17 0400  BP: 119/82 116/76  Pulse: 86 91  Resp:  18  Temp:    SpO2: 96% 99%    Labs (all labs ordered are listed, but only abnormal results are displayed)  Results for orders placed or performed during the  hospital encounter of 02/17/17  Lipase, blood  Result Value Ref Range   Lipase 92 (H) 11 - 51 U/L  Comprehensive metabolic panel  Result Value Ref Range   Sodium 128 (L) 135 - 145 mmol/L   Potassium 4.0 3.5 - 5.1 mmol/L   Chloride 97 (L) 101 - 111 mmol/L   CO2 17 (L) 22 - 32 mmol/L   Glucose, Bld 435 (H) 65 - 99 mg/dL   BUN 22 (H) 6 - 20 mg/dL   Creatinine, Ser 7.34 (H) 0.61 - 1.24 mg/dL   Calcium 8.4 (L) 8.9 - 10.3 mg/dL   Total Protein 8.3 (H) 6.5 - 8.1 g/dL   Albumin 2.2 (L) 3.5 - 5.0 g/dL   AST 21 15 - 41 U/L   ALT 14 (L) 17 - 63 U/L   Alkaline Phosphatase 126 38 - 126 U/L   Total Bilirubin 0.8 0.3 - 1.2 mg/dL   GFR calc non Af Amer 46 (L) >60 mL/min   GFR calc Af Amer 54 (L) >60 mL/min   Anion gap 14 5 - 15  CBC  Result Value Ref Range   WBC 9.4 4.0 - 10.5 K/uL   RBC 4.48 4.22 - 5.81 MIL/uL   Hemoglobin 13.2 13.0 - 17.0 g/dL   HCT 28.7 (L) 68.1 - 15.7 %   MCV 83.5 78.0 - 100.0 fL   MCH 29.5 26.0 - 34.0 pg   MCHC 35.3 30.0 - 36.0 g/dL   RDW 26.2 03.5 - 59.7 %   Platelets 398 150 - 400 K/uL  Acetaminophen level  Result Value Ref Range   Acetaminophen (Tylenol), Serum <10 (L) 10 - 30 ug/mL  Salicylate level  Result Value Ref Range   Salicylate Lvl <7.0 2.8 - 30.0 mg/dL  Urinalysis, Routine w reflex microscopic  Result Value Ref Range   Color, Urine YELLOW YELLOW   APPearance TURBID (A) CLEAR   Specific Gravity, Urine 1.017 1.005 - 1.030   pH 6.0 5.0 - 8.0   Glucose, UA >=500 (A) NEGATIVE mg/dL   Hgb urine dipstick MODERATE (A) NEGATIVE   Bilirubin Urine NEGATIVE NEGATIVE   Ketones, ur NEGATIVE NEGATIVE mg/dL   Protein, ur >=416 (A) NEGATIVE mg/dL   Nitrite NEGATIVE NEGATIVE   Leukocytes, UA LARGE (A) NEGATIVE  RBC / HPF 6-30 0 - 5 RBC/hpf   WBC, UA 6-30 0 - 5 WBC/hpf   Bacteria, UA MANY (A) NONE SEEN   Squamous Epithelial / LPF NONE SEEN NONE SEEN   WBC Clumps PRESENT    Mucus PRESENT    Hyaline Casts, UA PRESENT    Ct Renal Stone Study  Result Date:  02/18/2017 CLINICAL DATA:  Acute onset of generalized weakness and loss of appetite. Incontinence for 5 days. Initial encounter. EXAM: CT ABDOMEN AND PELVIS WITHOUT CONTRAST TECHNIQUE: Multidetector CT imaging of the abdomen and pelvis was performed following the standard protocol without IV contrast. COMPARISON:  Abdominal ultrasound performed 08/08/2016, and CT of the abdomen and pelvis performed 10/23/2008 FINDINGS: Lower chest: Small bilateral pleural effusions are noted. Mild hazy bibasilar opacities may reflect interstitial edema. The visualized portions of the mediastinum are unremarkable. Hepatobiliary: The liver is unremarkable in appearance. The gallbladder is unremarkable in appearance. The common bile duct remains normal in caliber. Pancreas: The pancreas is within normal limits. Spleen: The spleen is unremarkable in appearance. Adrenals/Urinary Tract: The adrenal glands are unremarkable in appearance. There is wall thickening along the left renal pelvis, with minimal left-sided hydronephrosis. No distal obstructing stone is seen. This is thought to reflect left-sided ureteritis. Mild nonspecific perinephric stranding is noted bilaterally. The right kidney is unremarkable in appearance. No renal or ureteral stones are identified. Stomach/Bowel: The appendix is distended to 1.2 cm in maximal diameter, with mild surrounding inflammation. Acute appendicitis cannot be excluded, though this may simply be reactive given the pelvic process. Wall thickening is noted along the ascending colon, concerning for an acute infectious or inflammatory process. Wall thickening is also noted at the rectum, with diffuse presacral stranding, concerning for proctitis. The small bowel is grossly unremarkable. The stomach is decompressed unremarkable in appearance. Vascular/Lymphatic: Scattered calcification is seen along the abdominal aorta and its branches. The abdominal aorta is otherwise grossly unremarkable. The inferior  vena cava is grossly unremarkable. No retroperitoneal lymphadenopathy is seen. No pelvic sidewall lymphadenopathy is identified. Reproductive: There is diffuse bladder wall thickening, with surrounding soft tissue inflammation, concerning for cystitis. Note is made of an apparent large abscess occupying the prostate, measuring 6.0 x 5.4 x 4.2 cm. This difficult to fully assess without contrast. Surrounding soft tissue inflammation is noted. Other: No additional soft tissue abnormalities are seen. Musculoskeletal: No acute osseous abnormalities are identified. The visualized musculature is unremarkable in appearance. IMPRESSION: 1. Apparent large abscess occupying the prostate, measuring 6.0 x 5.4 x 4.2 cm. Prostatic abscesses are rare, typically a complication of acute bacterial prostatitis. Surrounding soft tissue inflammation noted. 2. Bladder wall thickening, with surrounding soft tissue inflammation, concerning for cystitis. 3. Wall thickening at the rectum, with diffuse presacral stranding, concerning for associated proctitis. 4. Wall thickening along the ascending colon, concerning for an acute infectious or inflammatory process. 5. Distention of the appendix to 1.2 cm in maximal diameter, with mild surrounding inflammation. Acute appendicitis cannot be excluded, though this may simply reflect the underlying pelvic infection. 6. Wall thickening along the left renal pelvis, with minimal left-sided hydronephrosis, thought to reflect left-sided ureteritis. No obstructing stone seen. 7. Small bilateral pleural effusions. Mild hazy bibasilar airspace opacities may reflect minimal interstitial edema. These results were called by telephone at the time of interpretation on 02/18/2017 at 4:45 am to Dr. Cy BlamerAPRIL Avion Patella, who verbally acknowledged these results. Electronically Signed   By: Roanna RaiderJeffery  Chang M.D.   On: 02/18/2017 04:47    Radiology Ct Renal  Stone Study  Result Date: 02/18/2017 CLINICAL DATA:  Acute onset  of generalized weakness and loss of appetite. Incontinence for 5 days. Initial encounter. EXAM: CT ABDOMEN AND PELVIS WITHOUT CONTRAST TECHNIQUE: Multidetector CT imaging of the abdomen and pelvis was performed following the standard protocol without IV contrast. COMPARISON:  Abdominal ultrasound performed 08/08/2016, and CT of the abdomen and pelvis performed 10/23/2008 FINDINGS: Lower chest: Small bilateral pleural effusions are noted. Mild hazy bibasilar opacities may reflect interstitial edema. The visualized portions of the mediastinum are unremarkable. Hepatobiliary: The liver is unremarkable in appearance. The gallbladder is unremarkable in appearance. The common bile duct remains normal in caliber. Pancreas: The pancreas is within normal limits. Spleen: The spleen is unremarkable in appearance. Adrenals/Urinary Tract: The adrenal glands are unremarkable in appearance. There is wall thickening along the left renal pelvis, with minimal left-sided hydronephrosis. No distal obstructing stone is seen. This is thought to reflect left-sided ureteritis. Mild nonspecific perinephric stranding is noted bilaterally. The right kidney is unremarkable in appearance. No renal or ureteral stones are identified. Stomach/Bowel: The appendix is distended to 1.2 cm in maximal diameter, with mild surrounding inflammation. Acute appendicitis cannot be excluded, though this may simply be reactive given the pelvic process. Wall thickening is noted along the ascending colon, concerning for an acute infectious or inflammatory process. Wall thickening is also noted at the rectum, with diffuse presacral stranding, concerning for proctitis. The small bowel is grossly unremarkable. The stomach is decompressed unremarkable in appearance. Vascular/Lymphatic: Scattered calcification is seen along the abdominal aorta and its branches. The abdominal aorta is otherwise grossly unremarkable. The inferior vena cava is grossly unremarkable. No  retroperitoneal lymphadenopathy is seen. No pelvic sidewall lymphadenopathy is identified. Reproductive: There is diffuse bladder wall thickening, with surrounding soft tissue inflammation, concerning for cystitis. Note is made of an apparent large abscess occupying the prostate, measuring 6.0 x 5.4 x 4.2 cm. This difficult to fully assess without contrast. Surrounding soft tissue inflammation is noted. Other: No additional soft tissue abnormalities are seen. Musculoskeletal: No acute osseous abnormalities are identified. The visualized musculature is unremarkable in appearance. IMPRESSION: 1. Apparent large abscess occupying the prostate, measuring 6.0 x 5.4 x 4.2 cm. Prostatic abscesses are rare, typically a complication of acute bacterial prostatitis. Surrounding soft tissue inflammation noted. 2. Bladder wall thickening, with surrounding soft tissue inflammation, concerning for cystitis. 3. Wall thickening at the rectum, with diffuse presacral stranding, concerning for associated proctitis. 4. Wall thickening along the ascending colon, concerning for an acute infectious or inflammatory process. 5. Distention of the appendix to 1.2 cm in maximal diameter, with mild surrounding inflammation. Acute appendicitis cannot be excluded, though this may simply reflect the underlying pelvic infection. 6. Wall thickening along the left renal pelvis, with minimal left-sided hydronephrosis, thought to reflect left-sided ureteritis. No obstructing stone seen. 7. Small bilateral pleural effusions. Mild hazy bibasilar airspace opacities may reflect minimal interstitial edema. These results were called by telephone at the time of interpretation on 02/18/2017 at 4:45 am to Dr. Cy Blamer, who verbally acknowledged these results. Electronically Signed   By: Roanna Raider M.D.   On: 02/18/2017 04:47    Procedures Procedures (including critical care time)  Medications Ordered in ED Medications  vancomycin (VANCOCIN) IVPB  1000 mg/200 mL premix (not administered)  piperacillin-tazobactam (ZOSYN) IVPB 3.375 g (not administered)  metroNIDAZOLE (FLAGYL) IVPB 500 mg (not administered)  sodium chloride 0.9 % bolus 1,000 mL (1,000 mLs Intravenous New Bag/Given 02/18/17 0433)     Case d/w  urology this is not a urology problem, IR should drain abscess.    Consulted general surgery, Dr. Derrell Lolling.  Surgery will consult on patient  Consulted IR they will see the patient. Dr. Rica Records    Final Clinical Impressions(s) / ED Diagnoses  UTI Prostate infection Prostate abscess Colitis/proctis   Have admitted to medicine    Jocelin Schuelke, MD 02/18/17 1610

## 2017-02-18 NOTE — Progress Notes (Signed)
Hypoglycemic Event  CBG: 66  Treatment: D50 IV 25 mL  Symptoms: Sweaty  Follow-up CBG: Time: 1413 CBG Result: 99  Possible Reasons for Event: Inadequate meal intake  Comments/MD notified: Dr. Maryfrances Bunnell paged    Jetta Lout

## 2017-02-18 NOTE — Progress Notes (Signed)
This is a no charge note.  For further details, please see H&P from earlier today by my partner Dr. Selena Batten.  61 yo M with IDDM, CHF EF 10-15% not on adv HF therapies, HTN, who presents with dysuria for a few days/abd pain found to have prostatitis and abscess.  D/w Urology.  Will get CT pelvis with contrast to better delineate process, then either IR drain or open drainage.  Continue CTX for now.  Gen surg saw re: appendicitis, do not feel imaging finding support appendicitis.

## 2017-02-18 NOTE — H&P (Signed)
TRH H&P   Patient Demographics:    [name], is a 61 y.o. male  MRN: [id number]   DOB - 1955-10-14  Admit Date - 02/17/2017  Outpatient Primary MD for the patient is [facility]  Referring MD/NP/PA: April Palumbo  Outpatient Specialists:   Patient coming from: home  Chief Complaint  Patient presents with  . loss of bowel control  . Back Pain  . Urinary Retention      HPI:    [name]  is a 61 y.o. male, w hypertension, dm2, cad, CHF 10-15%) apparently c/o burning w urination starting 2 days ago.  Pt has had diarrhea for the past 4 days.  Pt notes some generalized abdominal discomfort.  Pt denies fever, chills, n/v, constipation, brbpr.  Pt presented to ED due to diarrhea, and dysuria.   In ED,   Wbc 9.4, Hgb 13.2, Plt 398 Na 128, K 4.0 Glucose 435 Bn 22, Creatinine 1.55 Hco3=17  IMPRESSION: 1. Apparent large abscess occupying the prostate, measuring 6.0 x 5.4 x 4.2 cm. Prostatic abscesses are rare, typically a complication of acute bacterial prostatitis. Surrounding soft tissue inflammation noted. 2. Bladder wall thickening, with surrounding soft tissue inflammation, concerning for cystitis. 3. Wall thickening at the rectum, with diffuse presacral stranding, concerning for associated proctitis. 4. Wall thickening along the ascending colon, concerning for an acute infectious or inflammatory process. 5. Distention of the appendix to 1.2 cm in maximal diameter, with mild surrounding inflammation. Acute appendicitis cannot be excluded, though this may simply reflect the underlying pelvic infection. 6. Wall thickening along the left renal pelvis, with minimal left-sided hydronephrosis, thought to reflect left-sided ureteritis. No obstructing stone seen. 7. Small bilateral pleural effusions. Mild hazy bibasilar airspace opacities may reflect minimal  interstitial edema.  IR consulted by ED for prostate abscess, and will perform drainage. Surgery consulted by ED for possible appendicitis  Pt will be admitted for prostate abscessr/o appendicitis    Review of systems:    In addition to the HPI above,  No Fever-chills, No Headache, No changes with Vision or hearing, No problems swallowing food or Liquids, No Chest pain, Cough or Shortness of Breath, No Blood in stool or Urine,  No new skin rashes or bruises, No new joints pains-aches,  No new weakness, tingling, numbness in any extremity, No recent weight gain or loss, No polyuria, polydypsia or polyphagia, No significant Mental Stressors.  A full 10 point Review of Systems was done, except as stated above, all other Review of Systems were negative.   With Past History of the following :    Past Medical History:  Diagnosis Date  . CHF (congestive heart failure) (HCC)   . Coronary artery disease   . Depression   . Diabetes mellitus   . Hypertension   . Mental disorder   . MI (myocardial infarction) (HCC) 63   age 44  per patient      Past Surgical History:  Procedure Laterality Date  . ANGIOPLASTY  1984   at age 67  . LEFT AND RIGHT HEART CATHETERIZATION WITH CORONARY/GRAFT ANGIOGRAM  08/13/2012   Procedure: LEFT AND RIGHT HEART CATHETERIZATION WITH [name];  Surgeon: Kathleene Hazel, MD;  Location: [facility] CATH LAB;  Service: Cardiovascular;;      Social History:     Social History  Substance Use Topics  . Smoking status: Current Every Day Smoker    Packs/day: 0.50    Years: 40.00    Types: Cigarettes  . Smokeless tobacco: Never Used  . Alcohol use Yes     Comment: 4 40 oz beers daily     Lives - at home  Mobility - walks w assistance   Family History :     Family History  Problem Relation Age of Onset  . Heart attack Father        >60s  . Hypertension Father   . Diabetes Father   . Heart attack Sister   . Hypertension  Mother       Home Medications:   Prior to Admission medications   Medication Sig Start Date End Date Taking? Authorizing Provider  acetaminophen (TYLENOL) 325 MG tablet Take 650 mg by mouth every 6 (six) hours as needed for mild pain.   Yes [provider]  ARIPiprazole (ABILIFY) 10 MG tablet Take 5 mg by mouth daily.   Yes [provider]  aspirin 81 MG chewable tablet Chew 1 tablet (81 mg total) by mouth daily. 09/21/16  Yes Graciella Freer, PA-C  atorvastatin (LIPITOR) 20 MG tablet Take 1 tablet (20 mg total) by mouth daily at 6 PM. 09/21/16  Yes Tillery, Mariam Dollar, PA-C  buPROPion (WELLBUTRIN XL) 150 MG 24 hr tablet Take 1 tablet (150 mg total) by mouth daily. 12/06/16  Yes Nedrud, Jeanella Flattery, MD  carvedilol (COREG) 3.125 MG tablet Take 1 tablet (3.125 mg total) by mouth 2 (two) times daily. 12/06/16 12/06/17 Yes Valentino Nose, MD  digoxin (LANOXIN) 0.125 MG tablet Take 1 tablet (0.125 mg total) by mouth daily. 09/21/16  Yes Graciella Freer, PA-C  furosemide (LASIX) 40 MG tablet Take 40 mg by mouth 2 (two) times daily.   Yes [provider]  hydrALAZINE (APRESOLINE) 25 MG tablet Take 1 tablet (25 mg total) by mouth 3 (three) times daily. 09/21/16  Yes Graciella Freer, PA-C  insulin glargine (LANTUS) 100 UNIT/ML injection Inject 20 units into skin each night. Check BG daily. If CBG>300 can take 40 units. Patient taking differently: Inject 20-40 Units into the skin daily as needed (high blood sugar).  12/07/16  Yes NedrudJeanella Flattery, MD  isosorbide mononitrate (IMDUR) 30 MG 24 hr tablet Take 1 tablet (30 mg total) by mouth daily. 09/21/16  Yes Graciella Freer, PA-C  metFORMIN (GLUCOPHAGE) 1000 MG tablet Take 1 tablet (1,000 mg total) by mouth 2 (two) times daily with a meal. For high blood sugar control 08/13/16  Yes Maxie Barb, MD  potassium chloride SA (K-DUR,KLOR-CON) 20 MEQ tablet Take 2 tablets (40 mEq total) by mouth daily.  12/06/16  Yes NedrudJeanella Flattery, MD  rivaroxaban (XARELTO) 20 MG TABS tablet Take 1 tablet (20 mg total) by mouth daily with supper. 12/26/16  Yes Nedrud, Jeanella Flattery, MD  sacubitril-valsartan (ENTRESTO) 24-26 MG Take 1 tablet by mouth 2 (two) times daily. 10/19/16  Yes Laurey Morale, MD  sertraline (ZOLOFT) 100 MG tablet Take 100 mg  by mouth at bedtime.    Yes [provider]  traZODone (DESYREL) 100 MG tablet Take 1 tablet (100 mg total) by mouth at bedtime. For sleep 12/27/12  Yes Armandina Stammer I, NP     Allergies:    No Known Allergies   Physical Exam:   Vitals  Blood pressure 119/74, pulse 89, temperature (!) 97.4 F (36.3 C), temperature source Oral, resp. rate 18, height 6' 2.5" (1.892 m), weight 72.6 kg (160 lb), SpO2 99 %.   1. General  lying in bed in NAD,    2. Normal affect and insight, Not Suicidal or Homicidal, Awake Alert, Oriented X 3.  3. No F.N deficits, ALL C.Nerves Intact, Strength 5/5 all 4 extremities, Sensation intact all 4 extremities, Plantars down going.  4. Ears and Eyes appear Normal, Conjunctivae clear, PERRLA. Moist Oral Mucosa.  5. Supple Neck, No JVD, No cervical lymphadenopathy appriciated, No Carotid Bruits.  6. Symmetrical Chest wall movement, Good air movement bilaterally, CTAB.  7. RRR, No Gallops, Rubs or Murmurs, No Parasternal Heave.  8. Positive Bowel Sounds, Abdomen Soft, No tenderness, No organomegaly appriciated,No rebound -guarding or rigidity.  9.  No Cyanosis, Normal Skin Turgor, No Skin Rash or Bruise.  10. Good muscle tone,  joints appear normal , no effusions, Normal ROM.  11. No Palpable Lymph Nodes in Neck or Axillae     Data Review:    CBC  Recent Labs Lab 02/17/17 2148  WBC 9.4  HGB 13.2  HCT 37.4*  PLT 398  MCV 83.5  MCH 29.5  MCHC 35.3  RDW 12.7   ------------------------------------------------------------------------------------------------------------------  Chemistries   Recent Labs Lab  02/17/17 2148  NA 128*  K 4.0  CL 97*  CO2 17*  GLUCOSE 435*  BUN 22*  CREATININE 1.55*  CALCIUM 8.4*  AST 21  ALT 14*  ALKPHOS 126  BILITOT 0.8   ------------------------------------------------------------------------------------------------------------------ estimated creatinine clearance is 50.7 mL/min (A) (by C-G formula based on SCr of 1.55 mg/dL (H)). ------------------------------------------------------------------------------------------------------------------ No results for input(s): TSH, T4TOTAL, T3FREE, THYROIDAB in the last 72 hours.  Invalid input(s): FREET3  Coagulation profile No results for input(s): INR, PROTIME in the last 168 hours. ------------------------------------------------------------------------------------------------------------------- No results for input(s): DDIMER in the last 72 hours. -------------------------------------------------------------------------------------------------------------------  Cardiac Enzymes No results for input(s): CKMB, TROPONINI, MYOGLOBIN in the last 168 hours.  Invalid input(s): CK ------------------------------------------------------------------------------------------------------------------    Component Value Date/Time   BNP 1,958.9 (H) 12/04/2016 0609     ---------------------------------------------------------------------------------------------------------------  Urinalysis    Component Value Date/Time   COLORURINE YELLOW 02/18/2017 0311   APPEARANCEUR TURBID (A) 02/18/2017 0311   LABSPEC 1.017 02/18/2017 0311   PHURINE 6.0 02/18/2017 0311   GLUCOSEU >=500 (A) 02/18/2017 0311   HGBUR MODERATE (A) 02/18/2017 0311   BILIRUBINUR NEGATIVE 02/18/2017 0311   KETONESUR NEGATIVE 02/18/2017 0311   PROTEINUR >=300 (A) 02/18/2017 0311   UROBILINOGEN 2.0 (H) 02/20/2014 1339   NITRITE NEGATIVE 02/18/2017 0311   LEUKOCYTESUR LARGE (A) 02/18/2017 0311     ----------------------------------------------------------------------------------------------------------------   Imaging Results:    Ct Renal Stone Study  Result Date: 02/18/2017 CLINICAL DATA:  Acute onset of generalized weakness and loss of appetite. Incontinence for 5 days. Initial encounter. EXAM: CT ABDOMEN AND PELVIS WITHOUT CONTRAST TECHNIQUE: Multidetector CT imaging of the abdomen and pelvis was performed following the standard protocol without IV contrast. COMPARISON:  Abdominal ultrasound performed 08/08/2016, and CT of the abdomen and pelvis performed 10/23/2008 FINDINGS: Lower chest: Small bilateral pleural effusions are noted. Mild hazy  bibasilar opacities may reflect interstitial edema. The visualized portions of the mediastinum are unremarkable. Hepatobiliary: The liver is unremarkable in appearance. The gallbladder is unremarkable in appearance. The common bile duct remains normal in caliber. Pancreas: The pancreas is within normal limits. Spleen: The spleen is unremarkable in appearance. Adrenals/Urinary Tract: The adrenal glands are unremarkable in appearance. There is wall thickening along the left renal pelvis, with minimal left-sided hydronephrosis. No distal obstructing stone is seen. This is thought to reflect left-sided ureteritis. Mild nonspecific perinephric stranding is noted bilaterally. The right kidney is unremarkable in appearance. No renal or ureteral stones are identified. Stomach/Bowel: The appendix is distended to 1.2 cm in maximal diameter, with mild surrounding inflammation. Acute appendicitis cannot be excluded, though this may simply be reactive given the pelvic process. Wall thickening is noted along the ascending colon, concerning for an acute infectious or inflammatory process. Wall thickening is also noted at the rectum, with diffuse presacral stranding, concerning for proctitis. The small bowel is grossly unremarkable. The stomach is decompressed  unremarkable in appearance. Vascular/Lymphatic: Scattered calcification is seen along the abdominal aorta and its branches. The abdominal aorta is otherwise grossly unremarkable. The inferior vena cava is grossly unremarkable. No retroperitoneal lymphadenopathy is seen. No pelvic sidewall lymphadenopathy is identified. Reproductive: There is diffuse bladder wall thickening, with surrounding soft tissue inflammation, concerning for cystitis. Note is made of an apparent large abscess occupying the prostate, measuring 6.0 x 5.4 x 4.2 cm. This difficult to fully assess without contrast. Surrounding soft tissue inflammation is noted. Other: No additional soft tissue abnormalities are seen. Musculoskeletal: No acute osseous abnormalities are identified. The visualized musculature is unremarkable in appearance. IMPRESSION: 1. Apparent large abscess occupying the prostate, measuring 6.0 x 5.4 x 4.2 cm. Prostatic abscesses are rare, typically a complication of acute bacterial prostatitis. Surrounding soft tissue inflammation noted. 2. Bladder wall thickening, with surrounding soft tissue inflammation, concerning for cystitis. 3. Wall thickening at the rectum, with diffuse presacral stranding, concerning for associated proctitis. 4. Wall thickening along the ascending colon, concerning for an acute infectious or inflammatory process. 5. Distention of the appendix to 1.2 cm in maximal diameter, with mild surrounding inflammation. Acute appendicitis cannot be excluded, though this may simply reflect the underlying pelvic infection. 6. Wall thickening along the left renal pelvis, with minimal left-sided hydronephrosis, thought to reflect left-sided ureteritis. No obstructing stone seen. 7. Small bilateral pleural effusions. Mild hazy bibasilar airspace opacities may reflect minimal interstitial edema. These results were called by telephone at the time of interpretation on 02/18/2017 at 4:45 am to Dr. Cy Blamer, who verbally  acknowledged these results. Electronically Signed   By: Roanna Raider M.D.   On: 02/18/2017 04:47     Assessment & Plan:    Principal Problem:   Prostate abscess    Prostate abscess NPO IR drainage today, appreciate input NO Lovenox, or Xarelto  ? Appendicitis Surgery consulted by ED, appreciate input  Diarrhea STOP  METFORMIN Stool studies Flagyl 500mg  iv tid  Renal insufficiency Hydrate gently with ns iv Check cmp in am   Hyponatremia Hydrate gently with ns iv Check cmp in am  DVT Prophylaxis SCDs   AM Labs Ordered, also please review Full Orders  Family Communication: Admission, patients condition and plan of care including tests being ordered have been discussed with the patient  who indicate understanding and agree with the plan and Code Status.  Code Status FULL CODE  Likely DC to  home  Condition GUARDED  Consults called: surgery by ed, IR by ED,   Admission status: inpatient  Time spent in minutes : 45   Pearson GrippeJames Trevaughn Schear M.D on 02/18/2017 at 5:58 AM  Between 7am to 7pm - Pager - [phone]. After 7pm go to [email or web address] - password [email or web address]  [facility] - Office  [phone]

## 2017-02-18 NOTE — Procedures (Signed)
  Procedure: CT prostate abscess drain placemennt  78f Preprocedure diagnosis: abscess Postprocedure diagnosis: same EBL:   minimal Complications:  none immediate  See full dictation in [employer].  Thora Lance MD Main # [phone] Pager  [phone]

## 2017-02-18 NOTE — Progress Notes (Signed)
Pt admitted to the unit at 0824. Pt mental status is A&O x4. Pt oriented to room, staff, and call bell. Skin is intact except where otherwise charted. Full assessment charted in [facility]. Call bell within reach. Visitor guidelines reviewed w/ pt and/or family.

## 2017-02-18 NOTE — Consult Note (Signed)
Chief Complaint: Patient was seen in consultation today for prostate abscess drain placement Chief Complaint  Patient presents with  . loss of bowel control  . Back Pain  . Urinary Retention   at the request of Dr Salena Saner Liliane Shi  Supervising Physician: Oley Balm  Patient Status: [facility] - In-pt  History of Present Illness: [name] is a 61 y.o. male   Admitted from ED early this am Presented with dysuria; urgency +diarrhea; incontinence x 1 week CT today: shows prostate abscess per Dr Deanne Coffer Evaluation by Urologist and request for prostate abscess drain placement  Dr Deanne Coffer approves procedure  Past Medical History:  Diagnosis Date  . CHF (congestive heart failure) (HCC)   . Coronary artery disease   . Depression   . Diabetes mellitus   . Hypertension   . Mental disorder   . MI (myocardial infarction) (HCC) 37   age 58 per patient    Past Surgical History:  Procedure Laterality Date  . ANGIOPLASTY  1984   at age 84  . LEFT AND RIGHT HEART CATHETERIZATION WITH CORONARY/GRAFT ANGIOGRAM  08/13/2012   Procedure: LEFT AND RIGHT HEART CATHETERIZATION WITH [name];  Surgeon: Kathleene Hazel, MD;  Location: [facility] CATH LAB;  Service: Cardiovascular;;    Allergies: Patient has no known allergies.  Medications: Prior to Admission medications   Medication Sig Start Date End Date Taking? Authorizing Provider  acetaminophen (TYLENOL) 325 MG tablet Take 650 mg by mouth every 6 (six) hours as needed for mild pain.   Yes [provider]  ARIPiprazole (ABILIFY) 10 MG tablet Take 5 mg by mouth daily.   Yes [provider]  aspirin 81 MG chewable tablet Chew 1 tablet (81 mg total) by mouth daily. 09/21/16  Yes Graciella Freer, PA-C  atorvastatin (LIPITOR) 20 MG tablet Take 1 tablet (20 mg total) by mouth daily at 6 PM. 09/21/16  Yes Tillery, Mariam Dollar, PA-C  buPROPion (WELLBUTRIN XL) 150 MG 24 hr tablet Take 1 tablet  (150 mg total) by mouth daily. 12/06/16  Yes Nedrud, Jeanella Flattery, MD  carvedilol (COREG) 3.125 MG tablet Take 1 tablet (3.125 mg total) by mouth 2 (two) times daily. 12/06/16 12/06/17 Yes Valentino Nose, MD  digoxin (LANOXIN) 0.125 MG tablet Take 1 tablet (0.125 mg total) by mouth daily. 09/21/16  Yes Graciella Freer, PA-C  furosemide (LASIX) 40 MG tablet Take 40 mg by mouth 2 (two) times daily.   Yes [provider]  hydrALAZINE (APRESOLINE) 25 MG tablet Take 1 tablet (25 mg total) by mouth 3 (three) times daily. 09/21/16  Yes Graciella Freer, PA-C  insulin glargine (LANTUS) 100 UNIT/ML injection Inject 20 units into skin each night. Check BG daily. If CBG>300 can take 40 units. Patient taking differently: Inject 20-40 Units into the skin daily as needed (high blood sugar).  12/07/16  Yes NedrudJeanella Flattery, MD  isosorbide mononitrate (IMDUR) 30 MG 24 hr tablet Take 1 tablet (30 mg total) by mouth daily. 09/21/16  Yes Graciella Freer, PA-C  metFORMIN (GLUCOPHAGE) 1000 MG tablet Take 1 tablet (1,000 mg total) by mouth 2 (two) times daily with a meal. For high blood sugar control 08/13/16  Yes Maxie Barb, MD  potassium chloride SA (K-DUR,KLOR-CON) 20 MEQ tablet Take 2 tablets (40 mEq total) by mouth daily. 12/06/16  Yes NedrudJeanella Flattery, MD  rivaroxaban (XARELTO) 20 MG TABS tablet Take 1 tablet (20 mg total) by mouth daily with supper. 12/26/16  Yes Rozann Lesches, MD  sacubitril-valsartan (ENTRESTO) 24-26 MG Take 1 tablet by mouth 2 (two) times daily. 10/19/16  Yes Laurey Morale, MD  sertraline (ZOLOFT) 100 MG tablet Take 100 mg by mouth at bedtime.    Yes [provider]  traZODone (DESYREL) 100 MG tablet Take 1 tablet (100 mg total) by mouth at bedtime. For sleep 12/27/12  Yes Sanjuana Kava, NP     Family History  Problem Relation Age of Onset  . Heart attack Father        >60s  . Hypertension Father   . Diabetes Father   . Heart attack Sister   .  Hypertension Mother     Social History   Social History  . Marital status: Widowed    Spouse name: N/A  . Number of children: N/A  . Years of education: N/A   Social History Main Topics  . Smoking status: Current Every Day Smoker    Packs/day: 0.50    Years: 40.00    Types: Cigarettes  . Smokeless tobacco: Never Used  . Alcohol use Yes     Comment: 4 40 oz beers daily  . Drug use: Yes    Frequency: 7.0 times per week    Types: Marijuana, "Crack" cocaine     Comment: currently using  . Sexual activity: Yes   Other Topics Concern  . None   Social History Narrative   Reports being homeless x 2 weeks.     Review of Systems: A 12 point ROS discussed and pertinent positives are indicated in the HPI above.  All other systems are negative.  Review of Systems  Constitutional: Positive for activity change, appetite change and fatigue. Negative for fever.  Respiratory: Negative for shortness of breath.   Gastrointestinal: Positive for abdominal pain.  Genitourinary: Positive for dysuria.  Neurological: Positive for weakness.  Psychiatric/Behavioral: Negative for behavioral problems and confusion.    Vital Signs: BP 92/62 (BP Location: Right Arm)   Pulse 88   Temp (!) 97.4 F (36.3 C) (Oral)   Resp 18   Ht 6' 2.5" (1.892 m)   Wt 160 lb (72.6 kg)   SpO2 100%   BMI 20.27 kg/m   Physical Exam  Constitutional: He is oriented to person, place, and time.  Cardiovascular: Normal rate and regular rhythm.   Pulmonary/Chest: Effort normal and breath sounds normal.  Abdominal: Soft. Bowel sounds are normal. There is tenderness.  Musculoskeletal: Normal range of motion.  Neurological: He is alert and oriented to person, place, and time.  Skin: Skin is warm and dry.  Psychiatric: He has a normal mood and affect. His behavior is normal. Judgment and thought content normal.  Nursing note and vitals reviewed.   Imaging: Ct Renal Stone Study  Result Date: 02/18/2017 CLINICAL  DATA:  Acute onset of generalized weakness and loss of appetite. Incontinence for 5 days. Initial encounter. EXAM: CT ABDOMEN AND PELVIS WITHOUT CONTRAST TECHNIQUE: Multidetector CT imaging of the abdomen and pelvis was performed following the standard protocol without IV contrast. COMPARISON:  Abdominal ultrasound performed 08/08/2016, and CT of the abdomen and pelvis performed 10/23/2008 FINDINGS: Lower chest: Small bilateral pleural effusions are noted. Mild hazy bibasilar opacities may reflect interstitial edema. The visualized portions of the mediastinum are unremarkable. Hepatobiliary: The liver is unremarkable in appearance. The gallbladder is unremarkable in appearance. The common bile duct remains normal in caliber. Pancreas: The pancreas is within normal limits. Spleen: The spleen is unremarkable in appearance. Adrenals/Urinary Tract: The adrenal glands are unremarkable  in appearance. There is wall thickening along the left renal pelvis, with minimal left-sided hydronephrosis. No distal obstructing stone is seen. This is thought to reflect left-sided ureteritis. Mild nonspecific perinephric stranding is noted bilaterally. The right kidney is unremarkable in appearance. No renal or ureteral stones are identified. Stomach/Bowel: The appendix is distended to 1.2 cm in maximal diameter, with mild surrounding inflammation. Acute appendicitis cannot be excluded, though this may simply be reactive given the pelvic process. Wall thickening is noted along the ascending colon, concerning for an acute infectious or inflammatory process. Wall thickening is also noted at the rectum, with diffuse presacral stranding, concerning for proctitis. The small bowel is grossly unremarkable. The stomach is decompressed unremarkable in appearance. Vascular/Lymphatic: Scattered calcification is seen along the abdominal aorta and its branches. The abdominal aorta is otherwise grossly unremarkable. The inferior vena cava is grossly  unremarkable. No retroperitoneal lymphadenopathy is seen. No pelvic sidewall lymphadenopathy is identified. Reproductive: There is diffuse bladder wall thickening, with surrounding soft tissue inflammation, concerning for cystitis. Note is made of an apparent large abscess occupying the prostate, measuring 6.0 x 5.4 x 4.2 cm. This difficult to fully assess without contrast. Surrounding soft tissue inflammation is noted. Other: No additional soft tissue abnormalities are seen. Musculoskeletal: No acute osseous abnormalities are identified. The visualized musculature is unremarkable in appearance. IMPRESSION: 1. Apparent large abscess occupying the prostate, measuring 6.0 x 5.4 x 4.2 cm. Prostatic abscesses are rare, typically a complication of acute bacterial prostatitis. Surrounding soft tissue inflammation noted. 2. Bladder wall thickening, with surrounding soft tissue inflammation, concerning for cystitis. 3. Wall thickening at the rectum, with diffuse presacral stranding, concerning for associated proctitis. 4. Wall thickening along the ascending colon, concerning for an acute infectious or inflammatory process. 5. Distention of the appendix to 1.2 cm in maximal diameter, with mild surrounding inflammation. Acute appendicitis cannot be excluded, though this may simply reflect the underlying pelvic infection. 6. Wall thickening along the left renal pelvis, with minimal left-sided hydronephrosis, thought to reflect left-sided ureteritis. No obstructing stone seen. 7. Small bilateral pleural effusions. Mild hazy bibasilar airspace opacities may reflect minimal interstitial edema. These results were called by telephone at the time of interpretation on 02/18/2017 at 4:45 am to Dr. Cy BlamerAPRIL PALUMBO, who verbally acknowledged these results. Electronically Signed   By: Roanna RaiderJeffery  Chang M.D.   On: 02/18/2017 04:47    Labs:  CBC:  Recent Labs  12/04/16 0609 12/05/16 0226 12/06/16 0336 02/17/17 2148  WBC 5.3 9.6 4.4  9.4  HGB 15.1 14.9 14.5 13.2  HCT 42.6 42.4 40.6 37.4*  PLT 282 260 193 398    COAGS: No results for input(s): INR, APTT in the last 8760 hours.  BMP:  Recent Labs  12/05/16 0226 12/06/16 0336 12/07/16 0449 02/17/17 2148  NA 132* 135 134* 128*  K 3.9 3.4* 3.7 4.0  CL 101 104 104 97*  CO2 19* 24 22 17*  GLUCOSE 148* 87 155* 435*  BUN 13 19 15  22*  CALCIUM 7.9* 7.6* 8.0* 8.4*  CREATININE 1.07 1.15 0.89 1.55*  GFRNONAA >60 >60 >60 46*  GFRAA >60 >60 >60 54*    LIVER FUNCTION TESTS:  Recent Labs  08/12/16 0443 08/18/16 12/04/16 1120 12/05/16 0226 02/17/17 2148  BILITOT 1.9*  --  1.1 1.5* 0.8  AST 45* 34 61* 33 21  ALT 67* 34 29 23 14*  ALKPHOS 100 98 101 87 126  PROT 5.9*  --  7.4 6.4* 8.3*  ALBUMIN 2.2*  --  2.7* 2.3* 2.2*    TUMOR MARKERS: No results for input(s): AFPTM, CEA, CA199, CHROMGRNA in the last 8760 hours.  Assessment and Plan:  Prostate abscess Scheduled for drain placement Risks and benefits discussed with the patient including bleeding, infection, damage to adjacent structures, bowel perforation/fistula connection, and sepsis. All of the patient's questions were answered, patient is agreeable to proceed. Consent signed and in chart.   Thank you for this interesting consult.  I greatly enjoyed meeting [name] and look forward to participating in their care.  A copy of this report was sent to the requesting provider on this date.  Electronically Signed: Robet Leu, PA-C 02/18/2017, 2:21 PM   I spent a total of 40 Minutes    in face to face in clinical consultation, greater than 50% of which was counseling/coordinating care for prostate abscess drain

## 2017-02-19 DIAGNOSIS — N410 Acute prostatitis: Secondary | ICD-10-CM

## 2017-02-19 DIAGNOSIS — I10 Essential (primary) hypertension: Secondary | ICD-10-CM

## 2017-02-19 DIAGNOSIS — N289 Disorder of kidney and ureter, unspecified: Secondary | ICD-10-CM

## 2017-02-19 DIAGNOSIS — I2699 Other pulmonary embolism without acute cor pulmonale: Secondary | ICD-10-CM

## 2017-02-19 LAB — COMPREHENSIVE METABOLIC PANEL
ALK PHOS: 93 U/L (ref 38–126)
ALT: 13 U/L — ABNORMAL LOW (ref 17–63)
ANION GAP: 10 (ref 5–15)
AST: 19 U/L (ref 15–41)
Albumin: 1.7 g/dL — ABNORMAL LOW (ref 3.5–5.0)
BUN: 25 mg/dL — ABNORMAL HIGH (ref 6–20)
CALCIUM: 7.6 mg/dL — AB (ref 8.9–10.3)
CHLORIDE: 104 mmol/L (ref 101–111)
CO2: 19 mmol/L — ABNORMAL LOW (ref 22–32)
CREATININE: 1.39 mg/dL — AB (ref 0.61–1.24)
GFR, EST NON AFRICAN AMERICAN: 53 mL/min — AB (ref >60)
Glucose, Bld: 71 mg/dL (ref 65–99)
Potassium: 3.8 mmol/L (ref 3.5–5.1)
Sodium: 133 mmol/L — ABNORMAL LOW (ref 135–145)
Total Bilirubin: 1 mg/dL (ref 0.3–1.2)
Total Protein: 6.3 g/dL — ABNORMAL LOW (ref 6.5–8.1)

## 2017-02-19 LAB — CBC
HEMATOCRIT: 34.2 % — AB (ref 39.0–52.0)
HEMOGLOBIN: 11.8 g/dL — AB (ref 13.0–17.0)
MCH: 28.9 pg (ref 26.0–34.0)
MCHC: 34.5 g/dL (ref 30.0–36.0)
MCV: 83.6 fL (ref 78.0–100.0)
PLATELETS: 342 10*3/uL (ref 150–400)
RBC: 4.09 MIL/uL — AB (ref 4.22–5.81)
RDW: 12.6 % (ref 11.5–15.5)
WBC: 10.9 10*3/uL — AB (ref 4.0–10.5)

## 2017-02-19 LAB — GLUCOSE, CAPILLARY
GLUCOSE-CAPILLARY: 214 mg/dL — AB (ref 65–99)
GLUCOSE-CAPILLARY: 168 mg/dL — AB (ref 65–99)
GLUCOSE-CAPILLARY: 157 mg/dL — AB (ref 65–99)
Glucose-Capillary: 76 mg/dL (ref 65–99)
Glucose-Capillary: 73 mg/dL (ref 65–99)

## 2017-02-19 MED ORDER — INSULIN GLARGINE 100 UNIT/ML ~~LOC~~ SOLN
10.0000 [IU] | Freq: Every day | SUBCUTANEOUS | Status: DC
  Administered 2017-02-20 – 2017-02-22 (×3): 10 [IU] via SUBCUTANEOUS
  Filled 2017-02-19 (×3): qty 0.1

## 2017-02-19 MED ORDER — VANCOMYCIN HCL 10 G IV SOLR
1250.0000 mg | Freq: Once | INTRAVENOUS | Status: AC
  Administered 2017-02-19: 1250 mg via INTRAVENOUS
  Filled 2017-02-19: qty 1250

## 2017-02-19 MED ORDER — RIVAROXABAN 20 MG PO TABS
20.0000 mg | ORAL_TABLET | Freq: Every day | ORAL | Status: DC
  Administered 2017-02-19 – 2017-02-22 (×4): 20 mg via ORAL
  Filled 2017-02-19 (×4): qty 1

## 2017-02-19 MED ORDER — SODIUM CHLORIDE 0.9% FLUSH
5.0000 mL | Freq: Three times a day (TID) | INTRAVENOUS | Status: DC
  Administered 2017-02-19 – 2017-02-28 (×26): 5 mL via INTRAVENOUS

## 2017-02-19 MED ORDER — VANCOMYCIN HCL IN DEXTROSE 750-5 MG/150ML-% IV SOLN
750.0000 mg | Freq: Two times a day (BID) | INTRAVENOUS | Status: DC
  Administered 2017-02-19 – 2017-02-22 (×7): 750 mg via INTRAVENOUS
  Filled 2017-02-19 (×8): qty 150

## 2017-02-19 MED ORDER — ONDANSETRON HCL 4 MG/2ML IJ SOLN
4.0000 mg | Freq: Four times a day (QID) | INTRAMUSCULAR | Status: DC | PRN
  Administered 2017-02-20: 4 mg via INTRAVENOUS
  Filled 2017-02-19: qty 2

## 2017-02-19 NOTE — Progress Notes (Signed)
Pharmacy Antibiotic Note  [name] is a 61 y.o. male admitted on 02/17/2017 with prostatic abcess.  Pharmacy has been consulted for Vancomcyin dosing and he was already started on ceftriaxone during his admission. He presented to the ED due to 4 days of diarrhea and dysuria.  His WBCs are 10.9, he is afebrile, but his abscess fluid is growing abundant GPCs. A drain was placed on 10/27 by radiology and the patient is subjectively improving. The AKI that he presented with is improving with a Scr of 1.39 from 1.55 (baseline ~1).   Plan: Vancomycin 1250mg  loading dose Vancomycin 750 mg Q12h infusion Goal trough 15-20; troughs as indicated Monitor cultures, clinical status, renal fx, and LOT  Height: 6' 2.5" (189.2 cm) Weight: 154 lb 8 oz (70.1 kg) IBW/kg (Calculated) : 83.35  Temp (24hrs), Avg:98 F (36.7 C), Min:97.4 F (36.3 C), Max:98.4 F (36.9 C)   Recent Labs Lab 02/17/17 2148 02/19/17 0353  WBC 9.4 10.9*  CREATININE 1.55* 1.39*    Estimated Creatinine Clearance: 54.6 mL/min (A) (by C-G formula based on SCr of 1.39 mg/dL (H)).    No Known Allergies  Antimicrobials this admission: Ceftriaxone 10/27>> Vancomycin 10/28>>  Microbiology results: Abscess drain 10/27>> Abundant GPCs, culture pending Blood Cx 10/27>> sent   Nolen Mu PharmD PGY1 Pharmacy Practice Resident 02/19/2017 10:58 AM Pager: [phone]

## 2017-02-19 NOTE — Progress Notes (Signed)
PROGRESS NOTE    Baird CancerJerry W Loomer  WUJ:[id number] DOB: November 28, 1955 DOA: 02/17/2017 PCP: [facility]      Brief Narrative:  61 yo M with CHF EF 10-15%, HTN, IDDM who presents with few days dysuria, found to have sepsis from prostatitis, prostatic abscess.     Assessment & Plan:  Principal Problem:   Prostate abscess Active Problems:   Hyponatremia   Renal insufficiency   Hyperglycemia   Prostate abscess Drain in place now. Culture growing GPCs. -Urology following, appreciate cares -Continue ceftriaxone -Add vancomycin for Enterococcus, staph -Follow culture data    Appendicitis ruled out Gen Surg has evaluated,   Mild AKI Improving.  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 -Hold furosemide and Entresto for now given soft BP, AKI -Continue BB, aspirin, statin, dig  Hypertension -Continue BB -Hold Entresto, Imdur, hydralazine, diuretic for now until hemodynamics  Diabetes -Lower dose of Glargine -SSI with meals  Recent PE -Restart Xarelto  Other medications -Continue Abilify -Continue Wllbutrin, trazodone, sertraline         DVT prophylaxis: Sofie HartiganXaqrelto Code Status: FULL Family Communication: None present Disposition Plan: MOnitor culture data.  PT eval today.  Likely home with oral antibiotics to home or SNF pending PT eval in 2-3 days   Consultants:   Urology, IR, Gen Surg  Procedures:   Percutaneous drain placement 02/19/2017  Antimicrobials:   Vancomycin 10/28 >>  Ceftriaxone 10/27 >>    Subjective: Feeling somewhat better.  Very weak with walking.  Abdominal pain/groin pain improved, worse with movement, flares at times.  No vomiting, no fever, no chest pain, syncope.  Objective: Vitals:   02/18/17 1815 02/18/17 2133 02/19/17 0600 02/19/17 0602  BP: 106/68 103/60 (!)  97/57   Pulse:  78 76   Resp:  19 (!) 21   Temp:  98.1 F (36.7 C) 98.4 F (36.9 C)   TempSrc:  Oral Oral   SpO2:  98% 98%   Weight:    70.1 kg (154 lb 8 oz)  Height:        Intake/Output Summary (Last 24 hours) at 02/19/17 1020 Last data filed at 02/19/17 0551  Gross per 24 hour  Intake               55 ml  Output              868 ml  Net             -813 ml   Filed Weights   02/17/17 2140 02/19/17 0602  Weight: 72.6 kg (160 lb) 70.1 kg (154 lb 8 oz)    Examination: General appearance: Thin adult male, alert and in no acute distress.   HEENT: Anicteric, conjunctiva pink, lids and lashes normal. No nasal deformity, discharge, epistaxis.  Lips moist.   Skin: Warm and dry.   No suspicious rashes or lesions. Cardiac: RRR, nl S1-S2, no murmurs appreciated.  Capillary refill is brisk.  JVP normal.  No LE edema.  Respiratory: Normal respiratory rate and rhythm.  CTAB without rales or wheezes. Abdomen: Abdomen soft.  Mild suprapubic TTP no LLQ tenderness, no rebound. No ascites, distension, hepatosplenomegaly.   MSK: No deformities or effusions. Neuro: Awake and alert.  EOMI, moves all extremities. Speech fluent.    Psych: Sensorium intact and responding to questions, attention normal. Affect blunted.  Judgment and  insight appear normal.    Data Reviewed: I have personally reviewed following labs and imaging studies:  CBC:  Recent Labs Lab 02/17/17 2148 02/19/17 0353  WBC 9.4 10.9*  HGB 13.2 11.8*  HCT 37.4* 34.2*  MCV 83.5 83.6  PLT 398 342   Basic Metabolic Panel:  Recent Labs Lab 02/17/17 2148 02/19/17 0353  NA 128* 133*  K 4.0 3.8  CL 97* 104  CO2 17* 19*  GLUCOSE 435* 71  BUN 22* 25*  CREATININE 1.55* 1.39*  CALCIUM 8.4* 7.6*   GFR: Estimated Creatinine Clearance: 54.6 mL/min (A) (by C-G formula based on SCr of 1.39 mg/dL (H)). Liver Function Tests:  Recent Labs Lab 02/17/17 2148 02/19/17 0353  AST 21 19  ALT 14* 13*  ALKPHOS 126 93  BILITOT  0.8 1.0  PROT 8.3* 6.3*  ALBUMIN 2.2* 1.7*    Recent Labs Lab 02/17/17 2148  LIPASE 92*   No results for input(s): AMMONIA in the last 168 hours. Coagulation Profile: No results for input(s): INR, PROTIME in the last 168 hours. Cardiac Enzymes: No results for input(s): CKTOTAL, CKMB, CKMBINDEX, TROPONINI in the last 168 hours. BNP (last 3 results) No results for input(s): PROBNP in the last 8760 hours. HbA1C:  Recent Labs  02/18/17 0922  HGBA1C 10.0*   CBG:  Recent Labs Lab 02/18/17 1413 02/18/17 1746 02/18/17 2126 02/19/17 0559 02/19/17 0755  GLUCAP 99 144* 102* 76 73   Lipid Profile: No results for input(s): CHOL, HDL, LDLCALC, TRIG, CHOLHDL, LDLDIRECT in the last 72 hours. Thyroid Function Tests: No results for input(s): TSH, T4TOTAL, FREET4, T3FREE, THYROIDAB in the last 72 hours. Anemia Panel: No results for input(s): VITAMINB12, FOLATE, FERRITIN, TIBC, IRON, RETICCTPCT in the last 72 hours. Urine analysis:    Component Value Date/Time   COLORURINE YELLOW 02/18/2017 0311   APPEARANCEUR TURBID (A) 02/18/2017 0311   LABSPEC 1.017 02/18/2017 0311   PHURINE 6.0 02/18/2017 0311   GLUCOSEU >=500 (A) 02/18/2017 0311   HGBUR MODERATE (A) 02/18/2017 0311   BILIRUBINUR NEGATIVE 02/18/2017 0311   KETONESUR NEGATIVE 02/18/2017 0311   PROTEINUR >=300 (A) 02/18/2017 0311   UROBILINOGEN 2.0 (H) 02/20/2014 1339   NITRITE NEGATIVE 02/18/2017 0311   LEUKOCYTESUR LARGE (A) 02/18/2017 0311   Sepsis Labs: @LABRCNTIP (procalcitonin:4,lacticidven:4)  ) Recent Results (from the past 240 hour(s))  Aerobic/Anaerobic Culture (surgical/deep wound)     Status: None (Preliminary result)   Collection Time: 02/18/17  4:13 PM  Result Value Ref Range Status   Specimen Description ABSCESS DRAIN  Final   Special Requests NONE  Final   Gram Stain   Final    ABUNDANT WBC PRESENT, PREDOMINANTLY PMN ABUNDANT GRAM POSITIVE COCCI IN CLUSTERS    Culture PENDING  Incomplete   Report  Status PENDING  Incomplete         Radiology Studies: Ct Abdomen Pelvis W Contrast  Result Date: 02/18/2017 CLINICAL DATA:  Unspecified abdominal pain EXAM: CT ABDOMEN AND PELVIS WITH CONTRAST TECHNIQUE: Multidetector CT imaging of the abdomen and pelvis was performed using the standard protocol following bolus administration of intravenous contrast. CONTRAST:  75mL ISOVUE-300 IOPAMIDOL (ISOVUE-300) INJECTION 61% COMPARISON:  CT from yesterday FINDINGS: Lower chest: Moderate right and small left pleural effusion. Patient has history of CHF. Mild atelectasis. Hepatobiliary: Hepatic steatosis. No mass lesion is seen.No evidence of gallstone. Mild fluid density around the gallbladder which is likely secondary. No over distention or gallbladder wall thickening. Pancreas: Unremarkable. Spleen: Unremarkable. Adrenals/Urinary Tract: Negative adrenals. No hydronephrosis or stone.  Marked circumferential bladder wall thickening. The bladder is decompressed by Foley catheter. Stomach/Bowel: Proctitis with circumferential wall thickening. Mid and distal appendiceal wall thickening to 13 mm. No discrete surrounding fat stranding. Vascular/Lymphatic: No acute vascular abnormality. Diffuse atherosclerotic calcification of the aorta and its branches. No mass or adenopathy. Reproductive:There is multi septated fluid collection in the enlarged and ill-defined prostate, in total measuring up to 6.4 cm. The abscess bulges left posterior into the recto prostatic fat plane. Other: No ascites or pneumoperitoneum. Musculoskeletal: Degenerative changes. No acute or aggressive finding. IMPRESSION: 1. Cystitis and prostatitis with multi septated prostatic abscess measuring up to 6.4 cm. The abscess is tracking towards the inflamed rectum without signs of fistulization. 2. Nonprogressive appendicitis and proctitis. 3. Pleural effusions that are small on the left and moderate on the right. Electronically Signed   By: Marnee Spring  M.D.   On: 02/18/2017 14:26   Ct Renal Stone Study  Result Date: 02/18/2017 CLINICAL DATA:  Acute onset of generalized weakness and loss of appetite. Incontinence for 5 days. Initial encounter. EXAM: CT ABDOMEN AND PELVIS WITHOUT CONTRAST TECHNIQUE: Multidetector CT imaging of the abdomen and pelvis was performed following the standard protocol without IV contrast. COMPARISON:  Abdominal ultrasound performed 08/08/2016, and CT of the abdomen and pelvis performed 10/23/2008 FINDINGS: Lower chest: Small bilateral pleural effusions are noted. Mild hazy bibasilar opacities may reflect interstitial edema. The visualized portions of the mediastinum are unremarkable. Hepatobiliary: The liver is unremarkable in appearance. The gallbladder is unremarkable in appearance. The common bile duct remains normal in caliber. Pancreas: The pancreas is within normal limits. Spleen: The spleen is unremarkable in appearance. Adrenals/Urinary Tract: The adrenal glands are unremarkable in appearance. There is wall thickening along the left renal pelvis, with minimal left-sided hydronephrosis. No distal obstructing stone is seen. This is thought to reflect left-sided ureteritis. Mild nonspecific perinephric stranding is noted bilaterally. The right kidney is unremarkable in appearance. No renal or ureteral stones are identified. Stomach/Bowel: The appendix is distended to 1.2 cm in maximal diameter, with mild surrounding inflammation. Acute appendicitis cannot be excluded, though this may simply be reactive given the pelvic process. Wall thickening is noted along the ascending colon, concerning for an acute infectious or inflammatory process. Wall thickening is also noted at the rectum, with diffuse presacral stranding, concerning for proctitis. The small bowel is grossly unremarkable. The stomach is decompressed unremarkable in appearance. Vascular/Lymphatic: Scattered calcification is seen along the abdominal aorta and its branches.  The abdominal aorta is otherwise grossly unremarkable. The inferior vena cava is grossly unremarkable. No retroperitoneal lymphadenopathy is seen. No pelvic sidewall lymphadenopathy is identified. Reproductive: There is diffuse bladder wall thickening, with surrounding soft tissue inflammation, concerning for cystitis. Note is made of an apparent large abscess occupying the prostate, measuring 6.0 x 5.4 x 4.2 cm. This difficult to fully assess without contrast. Surrounding soft tissue inflammation is noted. Other: No additional soft tissue abnormalities are seen. Musculoskeletal: No acute osseous abnormalities are identified. The visualized musculature is unremarkable in appearance. IMPRESSION: 1. Apparent large abscess occupying the prostate, measuring 6.0 x 5.4 x 4.2 cm. Prostatic abscesses are rare, typically a complication of acute bacterial prostatitis. Surrounding soft tissue inflammation noted. 2. Bladder wall thickening, with surrounding soft tissue inflammation, concerning for cystitis. 3. Wall thickening at the rectum, with diffuse presacral stranding, concerning for associated proctitis. 4. Wall thickening along the ascending colon, concerning for an acute infectious or inflammatory process. 5. Distention of the appendix to 1.2 cm in maximal  diameter, with mild surrounding inflammation. Acute appendicitis cannot be excluded, though this may simply reflect the underlying pelvic infection. 6. Wall thickening along the left renal pelvis, with minimal left-sided hydronephrosis, thought to reflect left-sided ureteritis. No obstructing stone seen. 7. Small bilateral pleural effusions. Mild hazy bibasilar airspace opacities may reflect minimal interstitial edema. These results were called by telephone at the time of interpretation on 02/18/2017 at 4:45 am to Dr. Cy Blamer, who verbally acknowledged these results. Electronically Signed   By: Roanna Raider M.D.   On: 02/18/2017 04:47   Ct Image Guided  Drainage By Percutaneous Catheter  Result Date: 02/18/2017 CLINICAL DATA:  Multiloculated prostatic abscess. Drainage requested. EXAM: CT GUIDED DRAINAGE OF PROSTATIC ABSCESS ANESTHESIA/SEDATION: Intravenous Fentanyl and Versed were administered as conscious sedation during continuous monitoring of the patient's level of consciousness and physiological / cardiorespiratory status by the radiology RN, with a total moderate sedation time of 18 minutes. PROCEDURE: The procedure, risks, benefits, and alternatives were explained to the patient. Questions regarding the procedure were encouraged and answered. The patient understands and consents to the procedure. Patient placed prone. Select axial scans through the pelvis obtained. The prostatic collection was localized an appropriate skin entry site was determined and marked. The operative field was prepped with chlorhexidinein a sterile fashion, and a sterile drape was applied covering the operative field. A sterile gown and sterile gloves were used for the procedure. Local anesthesia was provided with 1% Lidocaine. Under CT fluoroscopic guidance, 18 gauge trocar needle advanced into the right posterolateral component of the collection. Purulent material could be aspirated. An Amplatz guidewire advanced easily within the collection, position confirmed on CT. Tract dilated to facilitate placement of a 10 French pigtail drain catheter, formed centrally within the right component of the collection. Catheter secured externally with 0 Prolene suture and StatLock, and placed to external gravity drain bag. A sample of the green peroneal aspirate was sent for Gram stain and culture. The patient tolerated the procedure well. COMPLICATIONS: None immediate FINDINGS: Multiloculated bilateral prostatic abscess was localized. Ten French pigtail drain catheter placed as above. Sample of the aspirate sent for Gram stain and culture. IMPRESSION: 1. Technically successful CT-guided  prostatic abscess drain catheter placement. Electronically Signed   By: Corlis Leak M.D.   On: 02/18/2017 16:35        Scheduled Meds: . ARIPiprazole  5 mg Oral Daily  . aspirin  81 mg Oral Daily  . atorvastatin  20 mg Oral q1800  . buPROPion  150 mg Oral Daily  . carvedilol  3.125 mg Oral BID  . digoxin  0.125 mg Oral Daily  . furosemide  40 mg Oral BID  . insulin aspart  0-9 Units Subcutaneous TID WC  . [START ON 02/20/2017] insulin glargine  10 Units Subcutaneous Daily  . isosorbide mononitrate  30 mg Oral Daily  . phenazopyridine  200 mg Oral TID WC  . potassium chloride SA  40 mEq Oral Daily  . sertraline  100 mg Oral QHS  . sodium chloride flush  5 mL Intravenous Q8H  . traZODone  100 mg Oral QHS   Continuous Infusions: . cefTRIAXone (ROCEPHIN)  IV Stopped (02/19/17 0824)     LOS: 1 day    Time spent: 30 minutes    Alberteen Sam, MD [facility] Pager [phone]  If 7PM-7AM, please contact night-coverage [email or web address] Password [email or web address] 02/19/2017, 10:20 AM

## 2017-02-19 NOTE — Progress Notes (Signed)
Referring Physician(s): Dr Salena Saner Liliane Shi  Supervising Physician: Oley Balm  Patient Status:  [facility] - In-pt  Chief Complaint:  Prostate abscess drain placed 10/27  Subjective:  Pt actually seems some better today OP pus like Flushing well per RN  Allergies: Patient has no known allergies.  Medications: Prior to Admission medications   Medication Sig Start Date End Date Taking? Authorizing Provider  acetaminophen (TYLENOL) 325 MG tablet Take 650 mg by mouth every 6 (six) hours as needed for mild pain.   Yes [provider]  ARIPiprazole (ABILIFY) 10 MG tablet Take 5 mg by mouth daily.   Yes [provider]  aspirin 81 MG chewable tablet Chew 1 tablet (81 mg total) by mouth daily. 09/21/16  Yes Graciella Freer, PA-C  atorvastatin (LIPITOR) 20 MG tablet Take 1 tablet (20 mg total) by mouth daily at 6 PM. 09/21/16  Yes Tillery, Mariam Dollar, PA-C  buPROPion (WELLBUTRIN XL) 150 MG 24 hr tablet Take 1 tablet (150 mg total) by mouth daily. 12/06/16  Yes Nedrud, Jeanella Flattery, MD  carvedilol (COREG) 3.125 MG tablet Take 1 tablet (3.125 mg total) by mouth 2 (two) times daily. 12/06/16 12/06/17 Yes Valentino Nose, MD  digoxin (LANOXIN) 0.125 MG tablet Take 1 tablet (0.125 mg total) by mouth daily. 09/21/16  Yes Graciella Freer, PA-C  furosemide (LASIX) 40 MG tablet Take 40 mg by mouth 2 (two) times daily.   Yes [provider]  hydrALAZINE (APRESOLINE) 25 MG tablet Take 1 tablet (25 mg total) by mouth 3 (three) times daily. 09/21/16  Yes Graciella Freer, PA-C  insulin glargine (LANTUS) 100 UNIT/ML injection Inject 20 units into skin each night. Check BG daily. If CBG>300 can take 40 units. Patient taking differently: Inject 20-40 Units into the skin daily as needed (high blood sugar).  12/07/16  Yes NedrudJeanella Flattery, MD  isosorbide mononitrate (IMDUR) 30 MG 24 hr tablet Take 1 tablet (30 mg total) by mouth daily. 09/21/16  Yes Graciella Freer, PA-C  metFORMIN (GLUCOPHAGE) 1000 MG tablet Take 1 tablet (1,000 mg total) by mouth 2 (two) times daily with a meal. For high blood sugar control 08/13/16  Yes Maxie Barb, MD  potassium chloride SA (K-DUR,KLOR-CON) 20 MEQ tablet Take 2 tablets (40 mEq total) by mouth daily. 12/06/16  Yes NedrudJeanella Flattery, MD  rivaroxaban (XARELTO) 20 MG TABS tablet Take 1 tablet (20 mg total) by mouth daily with supper. 12/26/16  Yes Nedrud, Jeanella Flattery, MD  sacubitril-valsartan (ENTRESTO) 24-26 MG Take 1 tablet by mouth 2 (two) times daily. 10/19/16  Yes Laurey Morale, MD  sertraline (ZOLOFT) 100 MG tablet Take 100 mg by mouth at bedtime.    Yes [provider]  traZODone (DESYREL) 100 MG tablet Take 1 tablet (100 mg total) by mouth at bedtime. For sleep 12/27/12  Yes Armandina Stammer I, NP     Vital Signs: BP (!) 97/57 (BP Location: Right Arm)   Pulse 76   Temp 98.4 F (36.9 C) (Oral)   Resp (!) 21   Ht 6' 2.5" (1.892 m)   Wt 154 lb 8 oz (70.1 kg)   SpO2 98%   BMI 19.57 kg/m   Physical Exam  Constitutional: He is oriented to person, place, and time.  Abdominal: Soft.  Musculoskeletal: Normal range of motion.  Neurological: He is alert and oriented to person, place, and time.  Skin: Skin is warm and dry.  Skin site is clean and dry Sl tender OP yellow  milky pus 20 cc yesterday 10 cc in bag Cx abundant wbc; Gram + cocci  Psychiatric: He has a normal mood and affect. His behavior is normal.  Nursing note and vitals reviewed.   Imaging: Ct Abdomen Pelvis W Contrast  Result Date: 02/18/2017 CLINICAL DATA:  Unspecified abdominal pain EXAM: CT ABDOMEN AND PELVIS WITH CONTRAST TECHNIQUE: Multidetector CT imaging of the abdomen and pelvis was performed using the standard protocol following bolus administration of intravenous contrast. CONTRAST:  75mL ISOVUE-300 IOPAMIDOL (ISOVUE-300) INJECTION 61% COMPARISON:  CT from yesterday FINDINGS: Lower chest: Moderate right and small left  pleural effusion. Patient has history of CHF. Mild atelectasis. Hepatobiliary: Hepatic steatosis. No mass lesion is seen.No evidence of gallstone. Mild fluid density around the gallbladder which is likely secondary. No over distention or gallbladder wall thickening. Pancreas: Unremarkable. Spleen: Unremarkable. Adrenals/Urinary Tract: Negative adrenals. No hydronephrosis or stone. Marked circumferential bladder wall thickening. The bladder is decompressed by Foley catheter. Stomach/Bowel: Proctitis with circumferential wall thickening. Mid and distal appendiceal wall thickening to 13 mm. No discrete surrounding fat stranding. Vascular/Lymphatic: No acute vascular abnormality. Diffuse atherosclerotic calcification of the aorta and its branches. No mass or adenopathy. Reproductive:There is multi septated fluid collection in the enlarged and ill-defined prostate, in total measuring up to 6.4 cm. The abscess bulges left posterior into the recto prostatic fat plane. Other: No ascites or pneumoperitoneum. Musculoskeletal: Degenerative changes. No acute or aggressive finding. IMPRESSION: 1. Cystitis and prostatitis with multi septated prostatic abscess measuring up to 6.4 cm. The abscess is tracking towards the inflamed rectum without signs of fistulization. 2. Nonprogressive appendicitis and proctitis. 3. Pleural effusions that are small on the left and moderate on the right. Electronically Signed   By: Marnee SpringJonathon  Watts M.D.   On: 02/18/2017 14:26   Ct Renal Stone Study  Result Date: 02/18/2017 CLINICAL DATA:  Acute onset of generalized weakness and loss of appetite. Incontinence for 5 days. Initial encounter. EXAM: CT ABDOMEN AND PELVIS WITHOUT CONTRAST TECHNIQUE: Multidetector CT imaging of the abdomen and pelvis was performed following the standard protocol without IV contrast. COMPARISON:  Abdominal ultrasound performed 08/08/2016, and CT of the abdomen and pelvis performed 10/23/2008 FINDINGS: Lower chest: Small  bilateral pleural effusions are noted. Mild hazy bibasilar opacities may reflect interstitial edema. The visualized portions of the mediastinum are unremarkable. Hepatobiliary: The liver is unremarkable in appearance. The gallbladder is unremarkable in appearance. The common bile duct remains normal in caliber. Pancreas: The pancreas is within normal limits. Spleen: The spleen is unremarkable in appearance. Adrenals/Urinary Tract: The adrenal glands are unremarkable in appearance. There is wall thickening along the left renal pelvis, with minimal left-sided hydronephrosis. No distal obstructing stone is seen. This is thought to reflect left-sided ureteritis. Mild nonspecific perinephric stranding is noted bilaterally. The right kidney is unremarkable in appearance. No renal or ureteral stones are identified. Stomach/Bowel: The appendix is distended to 1.2 cm in maximal diameter, with mild surrounding inflammation. Acute appendicitis cannot be excluded, though this may simply be reactive given the pelvic process. Wall thickening is noted along the ascending colon, concerning for an acute infectious or inflammatory process. Wall thickening is also noted at the rectum, with diffuse presacral stranding, concerning for proctitis. The small bowel is grossly unremarkable. The stomach is decompressed unremarkable in appearance. Vascular/Lymphatic: Scattered calcification is seen along the abdominal aorta and its branches. The abdominal aorta is otherwise grossly unremarkable. The inferior vena cava is grossly unremarkable. No retroperitoneal lymphadenopathy is seen. No pelvic sidewall lymphadenopathy is identified.  Reproductive: There is diffuse bladder wall thickening, with surrounding soft tissue inflammation, concerning for cystitis. Note is made of an apparent large abscess occupying the prostate, measuring 6.0 x 5.4 x 4.2 cm. This difficult to fully assess without contrast. Surrounding soft tissue inflammation is  noted. Other: No additional soft tissue abnormalities are seen. Musculoskeletal: No acute osseous abnormalities are identified. The visualized musculature is unremarkable in appearance. IMPRESSION: 1. Apparent large abscess occupying the prostate, measuring 6.0 x 5.4 x 4.2 cm. Prostatic abscesses are rare, typically a complication of acute bacterial prostatitis. Surrounding soft tissue inflammation noted. 2. Bladder wall thickening, with surrounding soft tissue inflammation, concerning for cystitis. 3. Wall thickening at the rectum, with diffuse presacral stranding, concerning for associated proctitis. 4. Wall thickening along the ascending colon, concerning for an acute infectious or inflammatory process. 5. Distention of the appendix to 1.2 cm in maximal diameter, with mild surrounding inflammation. Acute appendicitis cannot be excluded, though this may simply reflect the underlying pelvic infection. 6. Wall thickening along the left renal pelvis, with minimal left-sided hydronephrosis, thought to reflect left-sided ureteritis. No obstructing stone seen. 7. Small bilateral pleural effusions. Mild hazy bibasilar airspace opacities may reflect minimal interstitial edema. These results were called by telephone at the time of interpretation on 02/18/2017 at 4:45 am to Dr. Cy Blamer, who verbally acknowledged these results. Electronically Signed   By: Roanna Raider M.D.   On: 02/18/2017 04:47   Ct Image Guided Drainage By Percutaneous Catheter  Result Date: 02/18/2017 CLINICAL DATA:  Multiloculated prostatic abscess. Drainage requested. EXAM: CT GUIDED DRAINAGE OF PROSTATIC ABSCESS ANESTHESIA/SEDATION: Intravenous Fentanyl and Versed were administered as conscious sedation during continuous monitoring of the patient's level of consciousness and physiological / cardiorespiratory status by the radiology RN, with a total moderate sedation time of 18 minutes. PROCEDURE: The procedure, risks, benefits, and  alternatives were explained to the patient. Questions regarding the procedure were encouraged and answered. The patient understands and consents to the procedure. Patient placed prone. Select axial scans through the pelvis obtained. The prostatic collection was localized an appropriate skin entry site was determined and marked. The operative field was prepped with chlorhexidinein a sterile fashion, and a sterile drape was applied covering the operative field. A sterile gown and sterile gloves were used for the procedure. Local anesthesia was provided with 1% Lidocaine. Under CT fluoroscopic guidance, 18 gauge trocar needle advanced into the right posterolateral component of the collection. Purulent material could be aspirated. An Amplatz guidewire advanced easily within the collection, position confirmed on CT. Tract dilated to facilitate placement of a 10 French pigtail drain catheter, formed centrally within the right component of the collection. Catheter secured externally with 0 Prolene suture and StatLock, and placed to external gravity drain bag. A sample of the green peroneal aspirate was sent for Gram stain and culture. The patient tolerated the procedure well. COMPLICATIONS: None immediate FINDINGS: Multiloculated bilateral prostatic abscess was localized. Ten French pigtail drain catheter placed as above. Sample of the aspirate sent for Gram stain and culture. IMPRESSION: 1. Technically successful CT-guided prostatic abscess drain catheter placement. Electronically Signed   By: Corlis Leak M.D.   On: 02/18/2017 16:35    Labs:  CBC:  Recent Labs  12/05/16 0226 12/06/16 0336 02/17/17 2148 02/19/17 0353  WBC 9.6 4.4 9.4 10.9*  HGB 14.9 14.5 13.2 11.8*  HCT 42.4 40.6 37.4* 34.2*  PLT 260 193 398 342    COAGS: No results for input(s): INR, APTT in the last 8760  hours.  BMP:  Recent Labs  12/06/16 0336 12/07/16 0449 02/17/17 2148 02/19/17 0353  NA 135 134* 128* 133*  K 3.4* 3.7 4.0  3.8  CL 104 104 97* 104  CO2 24 22 17* 19*  GLUCOSE 87 155* 435* 71  BUN 19 15 22* 25*  CALCIUM 7.6* 8.0* 8.4* 7.6*  CREATININE 1.15 0.89 1.55* 1.39*  GFRNONAA >60 >60 46* 53*  GFRAA >60 >60 54* >60    LIVER FUNCTION TESTS:  Recent Labs  12/04/16 1120 12/05/16 0226 02/17/17 2148 02/19/17 0353  BILITOT 1.1 1.5* 0.8 1.0  AST 61* 33 21 19  ALT 29 23 14* 13*  ALKPHOS 101 87 126 93  PROT 7.4 6.4* 8.3* 6.3*  ALBUMIN 2.7* 2.3* 2.2* 1.7*    Assessment and Plan:  Prostate abscess drain placed 10/27 Better today OP milky pus Will follow  Electronically Signed: Roman Dubuc A, PA-C 02/19/2017, 10:04 AM   I spent a total of 15 Minutes at the the patient's bedside AND on the patient's hospital floor or unit, greater than 50% of which was counseling/coordinating care for prostate abscess drain

## 2017-02-19 NOTE — Progress Notes (Signed)
Subjective: No acute events overnight.  Feeling better this morning.  Still having some catheter related discomfort, but it improves with pyridium.    Objective: Vital signs in last 24 hours: Temp:  [97.4 F (36.3 C)-98.4 F (36.9 C)] 98.4 F (36.9 C) (10/28 0600) Pulse Rate:  [71-88] 76 (10/28 0600) Resp:  [15-21] 21 (10/28 0600) BP: (86-106)/(57-73) 97/57 (10/28 0600) SpO2:  [96 %-100 %] 98 % (10/28 0600) Weight:  [70.1 kg (154 lb 8 oz)] 70.1 kg (154 lb 8 oz) (10/28 0602)  Intake/Output from previous day: 10/27 0701 - 10/28 0700 In: 55 [IV Piggyback:50] Out: 868 [Urine:850; Drains:17; Stool:1]  Intake/Output this shift: No intake/output data recorded.  Physical Exam:  General: Alert and oriented CV: RRR, palpable distal pulses Lungs: CTAB, equal chest rise Abdomen: Soft, NTND, no rebound or guarding GU: perc drain in place with purulent fluid output, Foley draining pyridium-tinged urine Ext: NT, No erythema  Lab Results:  Recent Labs  02/17/17 2148 02/19/17 0353  HGB 13.2 11.8*  HCT 37.4* 34.2*   BMET  Recent Labs  02/17/17 2148 02/19/17 0353  NA 128* 133*  K 4.0 3.8  CL 97* 104  CO2 17* 19*  GLUCOSE 435* 71  BUN 22* 25*  CREATININE 1.55* 1.39*  CALCIUM 8.4* 7.6*     Studies/Results: Ct Abdomen Pelvis W Contrast  Result Date: 02/18/2017 CLINICAL DATA:  Unspecified abdominal pain EXAM: CT ABDOMEN AND PELVIS WITH CONTRAST TECHNIQUE: Multidetector CT imaging of the abdomen and pelvis was performed using the standard protocol following bolus administration of intravenous contrast. CONTRAST:  30mL ISOVUE-300 IOPAMIDOL (ISOVUE-300) INJECTION 61% COMPARISON:  CT from yesterday FINDINGS: Lower chest: Moderate right and small left pleural effusion. Patient has history of CHF. Mild atelectasis. Hepatobiliary: Hepatic steatosis. No mass lesion is seen.No evidence of gallstone. Mild fluid density around the gallbladder which is likely secondary. No over  distention or gallbladder wall thickening. Pancreas: Unremarkable. Spleen: Unremarkable. Adrenals/Urinary Tract: Negative adrenals. No hydronephrosis or stone. Marked circumferential bladder wall thickening. The bladder is decompressed by Foley catheter. Stomach/Bowel: Proctitis with circumferential wall thickening. Mid and distal appendiceal wall thickening to 13 mm. No discrete surrounding fat stranding. Vascular/Lymphatic: No acute vascular abnormality. Diffuse atherosclerotic calcification of the aorta and its branches. No mass or adenopathy. Reproductive:There is multi septated fluid collection in the enlarged and ill-defined prostate, in total measuring up to 6.4 cm. The abscess bulges left posterior into the recto prostatic fat plane. Other: No ascites or pneumoperitoneum. Musculoskeletal: Degenerative changes. No acute or aggressive finding. IMPRESSION: 1. Cystitis and prostatitis with multi septated prostatic abscess measuring up to 6.4 cm. The abscess is tracking towards the inflamed rectum without signs of fistulization. 2. Nonprogressive appendicitis and proctitis. 3. Pleural effusions that are small on the left and moderate on the right. Electronically Signed   By: Marnee Spring M.D.   On: 02/18/2017 14:26   Ct Renal Stone Study  Result Date: 02/18/2017 CLINICAL DATA:  Acute onset of generalized weakness and loss of appetite. Incontinence for 5 days. Initial encounter. EXAM: CT ABDOMEN AND PELVIS WITHOUT CONTRAST TECHNIQUE: Multidetector CT imaging of the abdomen and pelvis was performed following the standard protocol without IV contrast. COMPARISON:  Abdominal ultrasound performed 08/08/2016, and CT of the abdomen and pelvis performed 10/23/2008 FINDINGS: Lower chest: Small bilateral pleural effusions are noted. Mild hazy bibasilar opacities may reflect interstitial edema. The visualized portions of the mediastinum are unremarkable. Hepatobiliary: The liver is unremarkable in appearance. The  gallbladder is unremarkable in appearance.  The common bile duct remains normal in caliber. Pancreas: The pancreas is within normal limits. Spleen: The spleen is unremarkable in appearance. Adrenals/Urinary Tract: The adrenal glands are unremarkable in appearance. There is wall thickening along the left renal pelvis, with minimal left-sided hydronephrosis. No distal obstructing stone is seen. This is thought to reflect left-sided ureteritis. Mild nonspecific perinephric stranding is noted bilaterally. The right kidney is unremarkable in appearance. No renal or ureteral stones are identified. Stomach/Bowel: The appendix is distended to 1.2 cm in maximal diameter, with mild surrounding inflammation. Acute appendicitis cannot be excluded, though this may simply be reactive given the pelvic process. Wall thickening is noted along the ascending colon, concerning for an acute infectious or inflammatory process. Wall thickening is also noted at the rectum, with diffuse presacral stranding, concerning for proctitis. The small bowel is grossly unremarkable. The stomach is decompressed unremarkable in appearance. Vascular/Lymphatic: Scattered calcification is seen along the abdominal aorta and its branches. The abdominal aorta is otherwise grossly unremarkable. The inferior vena cava is grossly unremarkable. No retroperitoneal lymphadenopathy is seen. No pelvic sidewall lymphadenopathy is identified. Reproductive: There is diffuse bladder wall thickening, with surrounding soft tissue inflammation, concerning for cystitis. Note is made of an apparent large abscess occupying the prostate, measuring 6.0 x 5.4 x 4.2 cm. This difficult to fully assess without contrast. Surrounding soft tissue inflammation is noted. Other: No additional soft tissue abnormalities are seen. Musculoskeletal: No acute osseous abnormalities are identified. The visualized musculature is unremarkable in appearance. IMPRESSION: 1. Apparent large abscess  occupying the prostate, measuring 6.0 x 5.4 x 4.2 cm. Prostatic abscesses are rare, typically a complication of acute bacterial prostatitis. Surrounding soft tissue inflammation noted. 2. Bladder wall thickening, with surrounding soft tissue inflammation, concerning for cystitis. 3. Wall thickening at the rectum, with diffuse presacral stranding, concerning for associated proctitis. 4. Wall thickening along the ascending colon, concerning for an acute infectious or inflammatory process. 5. Distention of the appendix to 1.2 cm in maximal diameter, with mild surrounding inflammation. Acute appendicitis cannot be excluded, though this may simply reflect the underlying pelvic infection. 6. Wall thickening along the left renal pelvis, with minimal left-sided hydronephrosis, thought to reflect left-sided ureteritis. No obstructing stone seen. 7. Small bilateral pleural effusions. Mild hazy bibasilar airspace opacities may reflect minimal interstitial edema. These results were called by telephone at the time of interpretation on 02/18/2017 at 4:45 am to Dr. Cy BlamerAPRIL PALUMBO, who verbally acknowledged these results. Electronically Signed   By: Roanna RaiderJeffery  Chang M.D.   On: 02/18/2017 04:47   Ct Image Guided Drainage By Percutaneous Catheter  Result Date: 02/18/2017 CLINICAL DATA:  Multiloculated prostatic abscess. Drainage requested. EXAM: CT GUIDED DRAINAGE OF PROSTATIC ABSCESS ANESTHESIA/SEDATION: Intravenous Fentanyl and Versed were administered as conscious sedation during continuous monitoring of the patient's level of consciousness and physiological / cardiorespiratory status by the radiology RN, with a total moderate sedation time of 18 minutes. PROCEDURE: The procedure, risks, benefits, and alternatives were explained to the patient. Questions regarding the procedure were encouraged and answered. The patient understands and consents to the procedure. Patient placed prone. Select axial scans through the pelvis  obtained. The prostatic collection was localized an appropriate skin entry site was determined and marked. The operative field was prepped with chlorhexidinein a sterile fashion, and a sterile drape was applied covering the operative field. A sterile gown and sterile gloves were used for the procedure. Local anesthesia was provided with 1% Lidocaine. Under CT fluoroscopic guidance, 18 gauge trocar needle advanced into  the right posterolateral component of the collection. Purulent material could be aspirated. An Amplatz guidewire advanced easily within the collection, position confirmed on CT. Tract dilated to facilitate placement of a 10 French pigtail drain catheter, formed centrally within the right component of the collection. Catheter secured externally with 0 Prolene suture and StatLock, and placed to external gravity drain bag. A sample of the green peroneal aspirate was sent for Gram stain and culture. The patient tolerated the procedure well. COMPLICATIONS: None immediate FINDINGS: Multiloculated bilateral prostatic abscess was localized. Ten French pigtail drain catheter placed as above. Sample of the aspirate sent for Gram stain and culture. IMPRESSION: 1. Technically successful CT-guided prostatic abscess drain catheter placement. Electronically Signed   By: Corlis Leak M.D.   On: 02/18/2017 16:35    Assessment/Plan:  Prostatic abscess and acute cystitis  - s/p percutaneous drainage with IR on 02/18/17.  Fluid cultures pending.  Continue IV Rocephin for antibiotic coverage.  Keep Foley in place.  Will consider repeating a CT later today vs tomorrow AM to assess abscess drainage.  Appreciate IR's assistance.      LOS: 1 day   Rhoderick Moody, MD 02/19/2017, 11:25 AM  [facility] Pager: [phone]

## 2017-02-20 ENCOUNTER — Encounter (HOSPITAL_COMMUNITY): Payer: Self-pay | Admitting: Radiology

## 2017-02-20 ENCOUNTER — Inpatient Hospital Stay (HOSPITAL_COMMUNITY): Payer: Medicare Other

## 2017-02-20 LAB — CBC
HEMATOCRIT: 35 % — AB (ref 39.0–52.0)
HEMOGLOBIN: 11.9 g/dL — AB (ref 13.0–17.0)
MCH: 28.8 pg (ref 26.0–34.0)
MCHC: 34 g/dL (ref 30.0–36.0)
MCV: 84.7 fL (ref 78.0–100.0)
Platelets: 343 10*3/uL (ref 150–400)
RBC: 4.13 MIL/uL — AB (ref 4.22–5.81)
RDW: 12.9 % (ref 11.5–15.5)
WBC: 7.1 10*3/uL (ref 4.0–10.5)

## 2017-02-20 LAB — GLUCOSE, CAPILLARY
GLUCOSE-CAPILLARY: 153 mg/dL — AB (ref 65–99)
GLUCOSE-CAPILLARY: 189 mg/dL — AB (ref 65–99)
GLUCOSE-CAPILLARY: 76 mg/dL (ref 65–99)
GLUCOSE-CAPILLARY: 90 mg/dL (ref 65–99)
Glucose-Capillary: 259 mg/dL — ABNORMAL HIGH (ref 65–99)
Glucose-Capillary: 98 mg/dL (ref 65–99)

## 2017-02-20 LAB — BASIC METABOLIC PANEL
ANION GAP: 10 (ref 5–15)
BUN: 26 mg/dL — ABNORMAL HIGH (ref 6–20)
CALCIUM: 7.7 mg/dL — AB (ref 8.9–10.3)
CHLORIDE: 107 mmol/L (ref 101–111)
CO2: 16 mmol/L — AB (ref 22–32)
Creatinine, Ser: 1.44 mg/dL — ABNORMAL HIGH (ref 0.61–1.24)
GFR calc non Af Amer: 51 mL/min — ABNORMAL LOW (ref >60)
GFR, EST AFRICAN AMERICAN: 59 mL/min — AB (ref >60)
Glucose, Bld: 82 mg/dL (ref 65–99)
Potassium: 4.1 mmol/L (ref 3.5–5.1)
Sodium: 133 mmol/L — ABNORMAL LOW (ref 135–145)

## 2017-02-20 MED ORDER — CHLORHEXIDINE GLUCONATE CLOTH 2 % EX PADS
6.0000 | MEDICATED_PAD | Freq: Every day | CUTANEOUS | Status: AC
  Administered 2017-02-20 – 2017-02-24 (×5): 6 via TOPICAL

## 2017-02-20 MED ORDER — IOPAMIDOL (ISOVUE-300) INJECTION 61%
INTRAVENOUS | Status: AC
  Administered 2017-02-20: 100 mL
  Filled 2017-02-20: qty 100

## 2017-02-20 MED ORDER — MUPIROCIN 2 % EX OINT
TOPICAL_OINTMENT | CUTANEOUS | Status: AC
  Filled 2017-02-20: qty 22

## 2017-02-20 MED ORDER — MUPIROCIN 2 % EX OINT
1.0000 "application " | TOPICAL_OINTMENT | Freq: Two times a day (BID) | CUTANEOUS | Status: AC
  Administered 2017-02-20 – 2017-02-24 (×10): 1 via NASAL
  Filled 2017-02-20 (×2): qty 22

## 2017-02-20 MED ORDER — SODIUM CHLORIDE 0.9 % IV BOLUS (SEPSIS)
250.0000 mL | Freq: Once | INTRAVENOUS | Status: AC
  Administered 2017-02-20: 250 mL via INTRAVENOUS

## 2017-02-20 MED ORDER — SODIUM CHLORIDE 0.9 % IV BOLUS (SEPSIS)
500.0000 mL | Freq: Once | INTRAVENOUS | Status: AC
  Administered 2017-02-20: 500 mL via INTRAVENOUS

## 2017-02-20 MED ORDER — SODIUM CHLORIDE 0.9 % IV SOLN
INTRAVENOUS | Status: DC
  Administered 2017-02-20: 20:00:00 via INTRAVENOUS

## 2017-02-20 MED ORDER — GLUCOSE 40 % PO GEL
ORAL | Status: AC
  Filled 2017-02-20: qty 1

## 2017-02-20 NOTE — Progress Notes (Signed)
Subjective: No acute events over the past 24 hours.  Perineal pain much improved.   CT pelvis reviewed with the patient.  Objective: Vital signs in last 24 hours: Temp:  [97.5 F (36.4 C)-97.6 F (36.4 C)] 97.5 F (36.4 C) (10/29 1349) Pulse Rate:  [63-94] 80 (10/29 1349) Resp:  [18-20] 18 (10/29 1349) BP: (79-106)/(49-70) 106/70 (10/29 1349) SpO2:  [100 %] 100 % (10/29 1349)  Intake/Output from previous day: 10/28 0701 - 10/29 0700 In: 208 [IV Piggyback:200] Out: 35 [Urine:33; Stool:2]  Intake/Output this shift: No intake/output data recorded.  Physical Exam:  General: Alert and oriented CV: RRR, palpable distal pulses Lungs: CTAB, equal chest rise Abdomen: Soft, NTND, no rebound or guarding GU: Foley draining pyridium-tinged urine.  Pelvic drain in place purulent fluid output. Ext: NT, No erythema  Lab Results:  Recent Labs  02/17/17 2148 02/19/17 0353 02/20/17 0551  HGB 13.2 11.8* 11.9*  HCT 37.4* 34.2* 35.0*   BMET  Recent Labs  02/19/17 0353 02/20/17 0551  NA 133* 133*  K 3.8 4.1  CL 104 107  CO2 19* 16*  GLUCOSE 71 82  BUN 25* 26*  CREATININE 1.39* 1.44*  CALCIUM 7.6* 7.7*     Studies/Results: Ct Pelvis W Contrast  Result Date: 02/20/2017 CLINICAL DATA:  Status post percutaneous drainage of prostate abscess. EXAM: CT PELVIS WITH CONTRAST TECHNIQUE: Multidetector CT imaging of the pelvis was performed using the standard protocol following the bolus administration of intravenous contrast. CONTRAST:  ISOVUE-300 IOPAMIDOL (ISOVUE-300) INJECTION 61% COMPARISON:  02/18/2017 FINDINGS: Urinary Tract: Foley catheter decompresses the urinary bladder. Despite the decompressed state, bladder wall appears circumferentially thickened. Gas in the bladder lumen is compatible with the presence of the catheter. Bowel:  Unremarkable. Vascular/Lymphatic: Atherosclerotic calcification noted distal aorta and iliac arteries. No pelvic sidewall lymphadenopathy.  Reproductive: Prostate gland is enlarged and ill-defined. Right parasacral percutaneous drainage catheter is noted with the distal loop formed in the right aspect of the prostate gland. The right-sided intra prostatic fluid collection is markedly decompressed in the interval. There is persistence of the rim enhancing fluid collection in the left prostate gland extending posteriorly towards the rectum. This component measures 5.3 x 3.1 cm today, similar to 5.0 x 3.5 cm on the study from 2 days ago. Other: Edema is noted in the soft tissues of the pelvic for and body wall. Musculoskeletal: Bone windows reveal no worrisome lytic or sclerotic osseous lesions. IMPRESSION: 1. Interval decompression of the right component of the complex prostatic abscess. The rim enhancing fluid collection on the left extending posteriorly towards the rectum appears similar in size to the study from 2 days ago suggesting that it may not be drained by the existing catheter. Electronically Signed   By: Kennith Center M.D.   On: 02/20/2017 16:14    Assessment/Plan: Prostatic abscess and acute cystitis  - s/p percutaneous drainage with IR on 02/18/17.  Fluid cultures grew MRSA with a favorable sensitivity profile.  Currently on IV vancomycin.  He will likely need 3 total weeks of antibiotic coverage.  -CT pelvis from today was reviewed.  The prostatic abscess appears completely decompressed from the perc drain placed by Dr. Deanne Coffer.  However, there does appear to be an independent left peri-rectal fluid collection that could potentially need to be drained percutaneously.   -Recommend keeping his Foley catheter in place for 7-10 days to allow inflammation from his prostate to subside.  Will arrange outpatient follow-up for a voiding trial.  Please call back as  needed.   LOS: 2 days   Rhoderick Moodyhristopher Winter, MD 02/20/2017, 4:47 PM  [facility] Pager: [phone]

## 2017-02-20 NOTE — Progress Notes (Signed)
PT Cancellation Note  Patient Details Name: [name] MRN: [id number] DOB: 09-12-1955   Cancelled Treatment:    Reason Eval/Treat Not Completed: Patient at procedure or test/unavailable;Patient declined, no reason specified.  Pt heading to CT soon.  CNA's have recently finish bathing and pt is worn out.  Pt asks to defer to tomorrow. 02/20/2017  McIntire Bing, PT [phone]  (pager)   Eliseo Gum Kyana Aicher 02/20/2017, 1:51 PM

## 2017-02-20 NOTE — Progress Notes (Signed)
Referring Physician(s): Dr. Liliane ShiWinter  Supervising Physician: Oley BalmHassell, Daniel  Patient Status:  [facility]  Chief Complaint: Prostate abscess s/p drain placement 10/27 by Dr. Deanne CofferHassell  Subjective: Resting comfortably. Thought he had 2 drains- has 1 drain and foley in place.   Allergies: Patient has no known allergies.  Medications: Prior to Admission medications   Medication Sig Start Date End Date Taking? Authorizing Provider  acetaminophen (TYLENOL) 325 MG tablet Take 650 mg by mouth every 6 (six) hours as needed for mild pain.   Yes [provider]  ARIPiprazole (ABILIFY) 10 MG tablet Take 5 mg by mouth daily.   Yes [provider]  aspirin 81 MG chewable tablet Chew 1 tablet (81 mg total) by mouth daily. 09/21/16  Yes Graciella Freerillery, Michael Andrew, PA-C  atorvastatin (LIPITOR) 20 MG tablet Take 1 tablet (20 mg total) by mouth daily at 6 PM. 09/21/16  Yes Tillery, Mariam DollarMichael Andrew, PA-C  buPROPion (WELLBUTRIN XL) 150 MG 24 hr tablet Take 1 tablet (150 mg total) by mouth daily. 12/06/16  Yes Nedrud, Jeanella FlatteryMarybeth, MD  carvedilol (COREG) 3.125 MG tablet Take 1 tablet (3.125 mg total) by mouth 2 (two) times daily. 12/06/16 12/06/17 Yes Valentino NoseBoswell, Nathan, MD  digoxin (LANOXIN) 0.125 MG tablet Take 1 tablet (0.125 mg total) by mouth daily. 09/21/16  Yes Graciella Freerillery, Michael Andrew, PA-C  furosemide (LASIX) 40 MG tablet Take 40 mg by mouth 2 (two) times daily.   Yes [provider]  hydrALAZINE (APRESOLINE) 25 MG tablet Take 1 tablet (25 mg total) by mouth 3 (three) times daily. 09/21/16  Yes Graciella Freerillery, Michael Andrew, PA-C  insulin glargine (LANTUS) 100 UNIT/ML injection Inject 20 units into skin each night. Check BG daily. If CBG>300 can take 40 units. Patient taking differently: Inject 20-40 Units into the skin daily as needed (high blood sugar).  12/07/16  Yes NedrudJeanella Flattery, Marybeth, MD  isosorbide mononitrate (IMDUR) 30 MG 24 hr tablet Take 1 tablet (30 mg total) by mouth daily. 09/21/16   Yes Graciella Freerillery, Michael Andrew, PA-C  metFORMIN (GLUCOPHAGE) 1000 MG tablet Take 1 tablet (1,000 mg total) by mouth 2 (two) times daily with a meal. For high blood sugar control 08/13/16  Yes Maxie BarbBhandari, Dron Prasad, MD  potassium chloride SA (K-DUR,KLOR-CON) 20 MEQ tablet Take 2 tablets (40 mEq total) by mouth daily. 12/06/16  Yes NedrudJeanella Flattery, Marybeth, MD  rivaroxaban (XARELTO) 20 MG TABS tablet Take 1 tablet (20 mg total) by mouth daily with supper. 12/26/16  Yes Nedrud, Jeanella FlatteryMarybeth, MD  sacubitril-valsartan (ENTRESTO) 24-26 MG Take 1 tablet by mouth 2 (two) times daily. 10/19/16  Yes Laurey MoraleMcLean, Dalton S, MD  sertraline (ZOLOFT) 100 MG tablet Take 100 mg by mouth at bedtime.    Yes [provider]  traZODone (DESYREL) 100 MG tablet Take 1 tablet (100 mg total) by mouth at bedtime. For sleep 12/27/12  Yes Armandina StammerNwoko, Agnes I, NP     Vital Signs: BP (!) 90/58   Pulse 68   Temp (!) 97.5 F (36.4 C) (Oral)   Resp 20   Ht 6' 2.5" (1.892 m)   Wt 154 lb 8 oz (70.1 kg)   SpO2 100%   BMI 19.57 kg/m   Physical Exam  NAD, alert Abdomen: soft, nontender Skin: transgluteal drain in place.  Some drainage around insertion site, possibly positional. Thick, purulent output in drain.  Flushes easily per RN.   Imaging: Ct Abdomen Pelvis W Contrast  Result Date: 02/18/2017 CLINICAL DATA:  Unspecified abdominal pain EXAM: CT ABDOMEN  AND PELVIS WITH CONTRAST TECHNIQUE: Multidetector CT imaging of the abdomen and pelvis was performed using the standard protocol following bolus administration of intravenous contrast. CONTRAST:  22mL ISOVUE-300 IOPAMIDOL (ISOVUE-300) INJECTION 61% COMPARISON:  CT from yesterday FINDINGS: Lower chest: Moderate right and small left pleural effusion. Patient has history of CHF. Mild atelectasis. Hepatobiliary: Hepatic steatosis. No mass lesion is seen.No evidence of gallstone. Mild fluid density around the gallbladder which is likely secondary. No over distention or gallbladder wall thickening.  Pancreas: Unremarkable. Spleen: Unremarkable. Adrenals/Urinary Tract: Negative adrenals. No hydronephrosis or stone. Marked circumferential bladder wall thickening. The bladder is decompressed by Foley catheter. Stomach/Bowel: Proctitis with circumferential wall thickening. Mid and distal appendiceal wall thickening to 13 mm. No discrete surrounding fat stranding. Vascular/Lymphatic: No acute vascular abnormality. Diffuse atherosclerotic calcification of the aorta and its branches. No mass or adenopathy. Reproductive:There is multi septated fluid collection in the enlarged and ill-defined prostate, in total measuring up to 6.4 cm. The abscess bulges left posterior into the recto prostatic fat plane. Other: No ascites or pneumoperitoneum. Musculoskeletal: Degenerative changes. No acute or aggressive finding. IMPRESSION: 1. Cystitis and prostatitis with multi septated prostatic abscess measuring up to 6.4 cm. The abscess is tracking towards the inflamed rectum without signs of fistulization. 2. Nonprogressive appendicitis and proctitis. 3. Pleural effusions that are small on the left and moderate on the right. Electronically Signed   By: Marnee Spring M.D.   On: 02/18/2017 14:26   Ct Renal Stone Study  Result Date: 02/18/2017 CLINICAL DATA:  Acute onset of generalized weakness and loss of appetite. Incontinence for 5 days. Initial encounter. EXAM: CT ABDOMEN AND PELVIS WITHOUT CONTRAST TECHNIQUE: Multidetector CT imaging of the abdomen and pelvis was performed following the standard protocol without IV contrast. COMPARISON:  Abdominal ultrasound performed 08/08/2016, and CT of the abdomen and pelvis performed 10/23/2008 FINDINGS: Lower chest: Small bilateral pleural effusions are noted. Mild hazy bibasilar opacities may reflect interstitial edema. The visualized portions of the mediastinum are unremarkable. Hepatobiliary: The liver is unremarkable in appearance. The gallbladder is unremarkable in appearance.  The common bile duct remains normal in caliber. Pancreas: The pancreas is within normal limits. Spleen: The spleen is unremarkable in appearance. Adrenals/Urinary Tract: The adrenal glands are unremarkable in appearance. There is wall thickening along the left renal pelvis, with minimal left-sided hydronephrosis. No distal obstructing stone is seen. This is thought to reflect left-sided ureteritis. Mild nonspecific perinephric stranding is noted bilaterally. The right kidney is unremarkable in appearance. No renal or ureteral stones are identified. Stomach/Bowel: The appendix is distended to 1.2 cm in maximal diameter, with mild surrounding inflammation. Acute appendicitis cannot be excluded, though this may simply be reactive given the pelvic process. Wall thickening is noted along the ascending colon, concerning for an acute infectious or inflammatory process. Wall thickening is also noted at the rectum, with diffuse presacral stranding, concerning for proctitis. The small bowel is grossly unremarkable. The stomach is decompressed unremarkable in appearance. Vascular/Lymphatic: Scattered calcification is seen along the abdominal aorta and its branches. The abdominal aorta is otherwise grossly unremarkable. The inferior vena cava is grossly unremarkable. No retroperitoneal lymphadenopathy is seen. No pelvic sidewall lymphadenopathy is identified. Reproductive: There is diffuse bladder wall thickening, with surrounding soft tissue inflammation, concerning for cystitis. Note is made of an apparent large abscess occupying the prostate, measuring 6.0 x 5.4 x 4.2 cm. This difficult to fully assess without contrast. Surrounding soft tissue inflammation is noted. Other: No additional soft tissue abnormalities are seen. Musculoskeletal:  No acute osseous abnormalities are identified. The visualized musculature is unremarkable in appearance. IMPRESSION: 1. Apparent large abscess occupying the prostate, measuring 6.0 x 5.4 x  4.2 cm. Prostatic abscesses are rare, typically a complication of acute bacterial prostatitis. Surrounding soft tissue inflammation noted. 2. Bladder wall thickening, with surrounding soft tissue inflammation, concerning for cystitis. 3. Wall thickening at the rectum, with diffuse presacral stranding, concerning for associated proctitis. 4. Wall thickening along the ascending colon, concerning for an acute infectious or inflammatory process. 5. Distention of the appendix to 1.2 cm in maximal diameter, with mild surrounding inflammation. Acute appendicitis cannot be excluded, though this may simply reflect the underlying pelvic infection. 6. Wall thickening along the left renal pelvis, with minimal left-sided hydronephrosis, thought to reflect left-sided ureteritis. No obstructing stone seen. 7. Small bilateral pleural effusions. Mild hazy bibasilar airspace opacities may reflect minimal interstitial edema. These results were called by telephone at the time of interpretation on 02/18/2017 at 4:45 am to Dr. Cy Blamer, who verbally acknowledged these results. Electronically Signed   By: Roanna Raider M.D.   On: 02/18/2017 04:47   Ct Image Guided Drainage By Percutaneous Catheter  Result Date: 02/18/2017 CLINICAL DATA:  Multiloculated prostatic abscess. Drainage requested. EXAM: CT GUIDED DRAINAGE OF PROSTATIC ABSCESS ANESTHESIA/SEDATION: Intravenous Fentanyl and Versed were administered as conscious sedation during continuous monitoring of the patient's level of consciousness and physiological / cardiorespiratory status by the radiology RN, with a total moderate sedation time of 18 minutes. PROCEDURE: The procedure, risks, benefits, and alternatives were explained to the patient. Questions regarding the procedure were encouraged and answered. The patient understands and consents to the procedure. Patient placed prone. Select axial scans through the pelvis obtained. The prostatic collection was localized an  appropriate skin entry site was determined and marked. The operative field was prepped with chlorhexidinein a sterile fashion, and a sterile drape was applied covering the operative field. A sterile gown and sterile gloves were used for the procedure. Local anesthesia was provided with 1% Lidocaine. Under CT fluoroscopic guidance, 18 gauge trocar needle advanced into the right posterolateral component of the collection. Purulent material could be aspirated. An Amplatz guidewire advanced easily within the collection, position confirmed on CT. Tract dilated to facilitate placement of a 10 French pigtail drain catheter, formed centrally within the right component of the collection. Catheter secured externally with 0 Prolene suture and StatLock, and placed to external gravity drain bag. A sample of the green peroneal aspirate was sent for Gram stain and culture. The patient tolerated the procedure well. COMPLICATIONS: None immediate FINDINGS: Multiloculated bilateral prostatic abscess was localized. Ten French pigtail drain catheter placed as above. Sample of the aspirate sent for Gram stain and culture. IMPRESSION: 1. Technically successful CT-guided prostatic abscess drain catheter placement. Electronically Signed   By: Corlis Leak M.D.   On: 02/18/2017 16:35    Labs:  CBC:  Recent Labs  12/06/16 0336 02/17/17 2148 02/19/17 0353 02/20/17 0551  WBC 4.4 9.4 10.9* 7.1  HGB 14.5 13.2 11.8* 11.9*  HCT 40.6 37.4* 34.2* 35.0*  PLT 193 398 342 343    COAGS: No results for input(s): INR, APTT in the last 8760 hours.  BMP:  Recent Labs  12/07/16 0449 02/17/17 2148 02/19/17 0353 02/20/17 0551  NA 134* 128* 133* 133*  K 3.7 4.0 3.8 4.1  CL 104 97* 104 107  CO2 22 17* 19* 16*  GLUCOSE 155* 435* 71 82  BUN 15 22* 25* 26*  CALCIUM 8.0* 8.4*  7.6* 7.7*  CREATININE 0.89 1.55* 1.39* 1.44*  GFRNONAA >60 46* 53* 51*  GFRAA >60 54* >60 59*    LIVER FUNCTION TESTS:  Recent Labs  12/04/16 1120  12/05/16 0226 02/17/17 2148 02/19/17 0353  BILITOT 1.1 1.5* 0.8 1.0  AST 61* 33 21 19  ALT 29 23 14* 13*  ALKPHOS 101 87 126 93  PROT 7.4 6.4* 8.3* 6.3*  ALBUMIN 2.7* 2.3* 2.2* 1.7*    Assessment and Plan: Prostate abscess s/p drain placement 10/27 by Dr. Gretel Acre remains in place with thick, purulent output. WBC improved 10.9  7.9.  Afebrile.  Culture positive for MRSA.  IR to follow.   Electronically Signed: Hoyt Koch, PA 02/20/2017, 10:14 AM   I spent a total of 15 Minutes at the the patient's bedside AND on the patient's hospital floor or unit, greater than 50% of which was counseling/coordinating care for prostate abscess.

## 2017-02-20 NOTE — Progress Notes (Addendum)
Dr. Margo Aye notified that patient had what appeared to be an accelerated junctional rhythm on telemetry.  Patient denies CP.   He is AOx4.  EKG obtained, and results documented in Epic. Dr. Margo Aye will review. MD also notified that patient had a BP of 85/49.  Will administer bolus when ordered.Will continue to monitor patient.  After bolus, VS were rechecked; results closer to the patient's baseline.  No new orders from Dr. Margo Aye.  Will continue to monitor patient.

## 2017-02-20 NOTE — Progress Notes (Signed)
PROGRESS NOTE    Baird CancerJerry W Leathers  ZOX:[id number] DOB: 08-26-1955 DOA: 02/17/2017 PCP: [facility]      Brief Narrative:  61 yo M with CHF EF 10-15%, HTN, IDDM who presents with few days dysuria, found to have sepsis from prostatitis, prostatic abscess.     Assessment & Plan:  Principal Problem:   Prostate abscess Active Problems:   Hyponatremia   Renal insufficiency   Hyperglycemia   MRSA Prostate abscess Drain in place now. 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 -Hold furosemide and Entresto for now given soft BP, AKI -Continue aspirin, statin, dig -Hold BB  Hypertension Hypotensive -Bolus with IV fluids now, may need additional fluids overnight -Hold BB, Entresto, Imdur, hydralazine, diuretic for now until hemodynamics  Diabetes -Lower dose of Glargine -SSI with meals  Recent PE -Continue Xarelto  Other medications -Continue Abilify -Continue Wllbutrin, trazodone, sertraline         DVT prophylaxis: Xarelto Code Status: FULL Family Communication: None present Disposition Plan: PT eval then likely home with oral antibiotics to home or SNF pending PT eval in 2-3 days.   Dispo at this point pending CT reimaging today.   Consultants:   Urology, IR, Gen Surg  Procedures:   Percutaneous drain placement 02/19/2017  Antimicrobials:   Vancomycin 10/28 >>  Ceftriaxone 10/27 >> 10/29   Subjective: Feeling okay.  Mentation good. Feels weak.    Abdomina/groin pain improved.   No confusion, syncope, vomiting, fever, chest pain.  Objective: Vitals:   02/20/17 0546 02/20/17 0942 02/20/17 0943 02/20/17 1121  BP: (!) 90/58 (!) 89/58 (!) 90/58   Pulse: 63 68  94  Resp: 20      Temp: (!) 97.5 F (36.4 C)     TempSrc: Oral     SpO2: 100%     Weight:      Height:        Intake/Output Summary (Last 24 hours) at 02/20/17 1305 Last data filed at 02/20/17 0612  Gross per 24 hour  Intake              208 ml  Output               34 ml  Net              174 ml   Filed Weights   02/17/17 2140 02/19/17 0602  Weight: 72.6 kg (160 lb) 70.1 kg (154 lb 8 oz)    Examination: General appearance: Thin adult male, alert and in no acute distress.  Tired appearing. Skin: Warm and dry.   No suspicious rashes or lesions. Cardiac: RRR, nl S1-S2, no murmurs appreciated.  Capillary refill is brisk.  JVP normal.  No LE edema.  Respiratory: Normal respiratory rate and rhythm.  CTAB without rales or wheezes. Abdomen: Abdomen soft.  No significant TTP no LLQ tenderness, no rebound. No ascites, distension, hepatosplenomegaly.   MSK: No deformities or effusions. Neuro: Awake and alert.  EOMI, moves all extremities. Speech fluent.    Psych: Sensorium intact and responding to questions, attention normal. Affect blunted.  Judgment and insight appear normal.    Data Reviewed: I have personally reviewed following labs and imaging studies:  CBC:  Recent Labs Lab 02/17/17 2148 02/19/17 0353  02/20/17 0551  WBC 9.4 10.9* 7.1  HGB 13.2 11.8* 11.9*  HCT 37.4* 34.2* 35.0*  MCV 83.5 83.6 84.7  PLT 398 342 343   Basic Metabolic Panel:  Recent Labs Lab 02/17/17 2148 02/19/17 0353 02/20/17 0551  NA 128* 133* 133*  K 4.0 3.8 4.1  CL 97* 104 107  CO2 17* 19* 16*  GLUCOSE 435* 71 82  BUN 22* 25* 26*  CREATININE 1.55* 1.39* 1.44*  CALCIUM 8.4* 7.6* 7.7*   GFR: Estimated Creatinine Clearance: 52.7 mL/min (A) (by C-G formula based on SCr of 1.44 mg/dL (H)). Liver Function Tests:  Recent Labs Lab 02/17/17 2148 02/19/17 0353  AST 21 19  ALT 14* 13*  ALKPHOS 126 93  BILITOT 0.8 1.0  PROT 8.3* 6.3*  ALBUMIN 2.2* 1.7*    Recent Labs Lab 02/17/17 2148  LIPASE 92*    No results for input(s): AMMONIA in the last 168 hours. Coagulation Profile: No results for input(s): INR, PROTIME in the last 168 hours. Cardiac Enzymes: No results for input(s): CKTOTAL, CKMB, CKMBINDEX, TROPONINI in the last 168 hours. BNP (last 3 results) No results for input(s): PROBNP in the last 8760 hours. HbA1C:  Recent Labs  02/18/17 0922  HGBA1C 10.0*   CBG:  Recent Labs Lab 02/19/17 2008 02/20/17 0025 02/20/17 0351 02/20/17 0756 02/20/17 1159  GLUCAP 157* 76 90 153* 189*   Lipid Profile: No results for input(s): CHOL, HDL, LDLCALC, TRIG, CHOLHDL, LDLDIRECT in the last 72 hours. Thyroid Function Tests: No results for input(s): TSH, T4TOTAL, FREET4, T3FREE, THYROIDAB in the last 72 hours. Anemia Panel: No results for input(s): VITAMINB12, FOLATE, FERRITIN, TIBC, IRON, RETICCTPCT in the last 72 hours. Urine analysis:    Component Value Date/Time   COLORURINE YELLOW 02/18/2017 0311   APPEARANCEUR TURBID (A) 02/18/2017 0311   LABSPEC 1.017 02/18/2017 0311   PHURINE 6.0 02/18/2017 0311   GLUCOSEU >=500 (A) 02/18/2017 0311   HGBUR MODERATE (A) 02/18/2017 0311   BILIRUBINUR NEGATIVE 02/18/2017 0311   KETONESUR NEGATIVE 02/18/2017 0311   PROTEINUR >=300 (A) 02/18/2017 0311   UROBILINOGEN 2.0 (H) 02/20/2014 1339   NITRITE NEGATIVE 02/18/2017 0311   LEUKOCYTESUR LARGE (A) 02/18/2017 0311   Sepsis Labs: @LABRCNTIP (procalcitonin:4,lacticidven:4)  ) Recent Results (from the past 240 hour(s))  Blood culture (routine x 2)     Status: None (Preliminary result)   Collection Time: 02/18/17  4:40 AM  Result Value Ref Range Status   Specimen Description BLOOD LEFT ARM  Final   Special Requests   Final    BOTTLES DRAWN AEROBIC AND ANAEROBIC Blood Culture adequate volume   Culture NO GROWTH 1 DAY  Final   Report Status PENDING  Incomplete  Blood culture (routine x 2)     Status: None (Preliminary result)   Collection Time: 02/18/17  5:09 AM  Result Value Ref  Range Status   Specimen Description BLOOD RIGHT ARM  Final   Special Requests   Final    BOTTLES DRAWN AEROBIC ONLY Blood Culture adequate volume   Culture NO GROWTH 1 DAY  Final   Report Status PENDING  Incomplete  Aerobic/Anaerobic Culture (surgical/deep wound)     Status: None (Preliminary result)   Collection Time: 02/18/17  4:13 PM  Result Value Ref Range Status   Specimen Description ABSCESS DRAIN  Final   Special Requests NONE  Final   Gram Stain   Final    ABUNDANT WBC PRESENT, PREDOMINANTLY PMN ABUNDANT GRAM POSITIVE COCCI IN CLUSTERS    Culture  Final    ABUNDANT METHICILLIN RESISTANT STAPHYLOCOCCUS AUREUS NO ANAEROBES ISOLATED; CULTURE IN PROGRESS FOR 5 DAYS    Report Status PENDING  Incomplete   Organism ID, Bacteria METHICILLIN RESISTANT STAPHYLOCOCCUS AUREUS  Final      Susceptibility   Methicillin resistant staphylococcus aureus - MIC*    CIPROFLOXACIN <=0.5 SENSITIVE Sensitive     ERYTHROMYCIN >=8 RESISTANT Resistant     GENTAMICIN <=0.5 SENSITIVE Sensitive     OXACILLIN >=4 RESISTANT Resistant     TETRACYCLINE <=1 SENSITIVE Sensitive     VANCOMYCIN 1 SENSITIVE Sensitive     TRIMETH/SULFA <=10 SENSITIVE Sensitive     CLINDAMYCIN <=0.25 SENSITIVE Sensitive     RIFAMPIN <=0.5 SENSITIVE Sensitive     Inducible Clindamycin NEGATIVE Sensitive     * ABUNDANT METHICILLIN RESISTANT STAPHYLOCOCCUS AUREUS         Radiology Studies: Ct Image Guided Drainage By Percutaneous Catheter  Result Date: 02/18/2017 CLINICAL DATA:  Multiloculated prostatic abscess. Drainage requested. EXAM: CT GUIDED DRAINAGE OF PROSTATIC ABSCESS ANESTHESIA/SEDATION: Intravenous Fentanyl and Versed were administered as conscious sedation during continuous monitoring of the patient's level of consciousness and physiological / cardiorespiratory status by the radiology RN, with a total moderate sedation time of 18 minutes. PROCEDURE: The procedure, risks, benefits, and alternatives were explained  to the patient. Questions regarding the procedure were encouraged and answered. The patient understands and consents to the procedure. Patient placed prone. Select axial scans through the pelvis obtained. The prostatic collection was localized an appropriate skin entry site was determined and marked. The operative field was prepped with chlorhexidinein a sterile fashion, and a sterile drape was applied covering the operative field. A sterile gown and sterile gloves were used for the procedure. Local anesthesia was provided with 1% Lidocaine. Under CT fluoroscopic guidance, 18 gauge trocar needle advanced into the right posterolateral component of the collection. Purulent material could be aspirated. An Amplatz guidewire advanced easily within the collection, position confirmed on CT. Tract dilated to facilitate placement of a 10 French pigtail drain catheter, formed centrally within the right component of the collection. Catheter secured externally with 0 Prolene suture and StatLock, and placed to external gravity drain bag. A sample of the green peroneal aspirate was sent for Gram stain and culture. The patient tolerated the procedure well. COMPLICATIONS: None immediate FINDINGS: Multiloculated bilateral prostatic abscess was localized. Ten French pigtail drain catheter placed as above. Sample of the aspirate sent for Gram stain and culture. IMPRESSION: 1. Technically successful CT-guided prostatic abscess drain catheter placement. Electronically Signed   By: Corlis Leak M.D.   On: 02/18/2017 16:35        Scheduled Meds: . mupirocin ointment      . ARIPiprazole  5 mg Oral Daily  . aspirin  81 mg Oral Daily  . atorvastatin  20 mg Oral q1800  . buPROPion  150 mg Oral Daily  . Chlorhexidine Gluconate Cloth  6 each Topical Q0600  . digoxin  0.125 mg Oral Daily  . insulin aspart  0-9 Units Subcutaneous TID WC  . insulin glargine  10 Units Subcutaneous Daily  . mupirocin ointment  1 application Nasal BID    . phenazopyridine  200 mg Oral TID WC  . rivaroxaban  20 mg Oral Q supper  . sertraline  100 mg Oral QHS  . sodium chloride flush  5 mL Intravenous Q8H  . traZODone  100 mg Oral QHS   Continuous Infusions: . vancomycin 750 mg (02/20/17 1245)  LOS: 2 days    Time spent: 25 minutes    Alberteen Sam, MD [facility] Pager [phone]  If 7PM-7AM, please contact night-coverage [email or web address] Password [email or web address] 02/20/2017, 1:05 PM

## 2017-02-20 NOTE — Progress Notes (Signed)
Patients blood pressure manually is 90/58 with heart rate of 68. Provider notified.

## 2017-02-21 LAB — GLUCOSE, CAPILLARY
GLUCOSE-CAPILLARY: 65 mg/dL (ref 65–99)
GLUCOSE-CAPILLARY: 159 mg/dL — AB (ref 65–99)
Glucose-Capillary: 160 mg/dL — ABNORMAL HIGH (ref 65–99)
Glucose-Capillary: 230 mg/dL — ABNORMAL HIGH (ref 65–99)
Glucose-Capillary: 278 mg/dL — ABNORMAL HIGH (ref 65–99)

## 2017-02-21 LAB — CBC
HCT: 34.7 % — ABNORMAL LOW (ref 39.0–52.0)
Hemoglobin: 11.7 g/dL — ABNORMAL LOW (ref 13.0–17.0)
MCH: 28.5 pg (ref 26.0–34.0)
MCHC: 33.7 g/dL (ref 30.0–36.0)
MCV: 84.4 fL (ref 78.0–100.0)
PLATELETS: 311 10*3/uL (ref 150–400)
RBC: 4.11 MIL/uL — AB (ref 4.22–5.81)
RDW: 13 % (ref 11.5–15.5)
WBC: 7.1 10*3/uL (ref 4.0–10.5)

## 2017-02-21 LAB — BASIC METABOLIC PANEL
Anion gap: 7 (ref 5–15)
BUN: 27 mg/dL — AB (ref 6–20)
CHLORIDE: 109 mmol/L (ref 101–111)
CO2: 18 mmol/L — ABNORMAL LOW (ref 22–32)
CREATININE: 1.22 mg/dL (ref 0.61–1.24)
Calcium: 7.9 mg/dL — ABNORMAL LOW (ref 8.9–10.3)
Glucose, Bld: 66 mg/dL (ref 65–99)
Potassium: 3.9 mmol/L (ref 3.5–5.1)
SODIUM: 134 mmol/L — AB (ref 135–145)

## 2017-02-21 MED ORDER — GLUCERNA SHAKE PO LIQD
237.0000 mL | Freq: Three times a day (TID) | ORAL | Status: DC
  Administered 2017-02-21 – 2017-02-23 (×5): 237 mL via ORAL

## 2017-02-21 NOTE — Progress Notes (Signed)
Inpatient Diabetes Program Recommendations  AACE/ADA: New Consensus Statement on Inpatient Glycemic Control (2015)  Target Ranges:  Prepandial:   less than 140 mg/dL      Peak postprandial:   less than 180 mg/dL (1-2 hours)      Critically ill patients:  140 - 180 mg/dL   Lab Results  Component Value Date   GLUCAP 160 (H) 02/21/2017   HGBA1C 10.0 (H) 02/18/2017    Review of Glycemic Control Results for [name], [name] (MRN [id number]) as of 02/21/2017 14:13  Ref. Range 02/20/2017 16:51 02/20/2017 23:03 02/21/2017 07:40 02/21/2017 10:14 02/21/2017 12:05  Glucose-Capillary Latest Ref Range: 65 - 99 mg/dL 916 (H) 98 65 384 (H) 665 (H)   Inpatient Diabetes Program Recommendations:  Spoke with patient @ bedside and patient states he only takes Lantus if his CBG is >290. Reviewed with patient Lantus is to be taken regularly daily and check CBGs. Spoke with pt about A1C results with them 10.0 (average CBG 240 over the past 2-3 months)and explained what an A1C is, basic pathophysiology of DM Type 2, basic home care, basic diabetes diet nutrition principles, importance of checking CBGs and maintaining good CBG control to prevent long-term and short-term complications. Reviewed signs and symptoms of hyperglycemia and hypoglycemia and how to treat hypoglycemia at home. Also reviewed blood sugar goals at home.  RNs to provide ongoing basic DM education at bedside with this patient.  Thank you, [name]. Thomas Birenbaum, RN, MSN, CDE  Diabetes Coordinator Inpatient Glycemic Control Team Team Pager [phone] (8am-5pm) 02/21/2017 2:19 PM

## 2017-02-21 NOTE — Progress Notes (Signed)
Referring Physician(s): Dr Pilar Grammes  Supervising Physician: Ruel Favors  Patient Status:  [facility] - In-pt  Chief Complaint:  Prostate abscess Drain placed 02/18/17  Subjective:  Better daily Wbc down afeb Restful Less complaints; less painful No record of OP since 10/28  Allergies: Patient has no known allergies.  Medications: Prior to Admission medications   Medication Sig Start Date End Date Taking? Authorizing Provider  acetaminophen (TYLENOL) 325 MG tablet Take 650 mg by mouth every 6 (six) hours as needed for mild pain.   Yes [provider]  ARIPiprazole (ABILIFY) 10 MG tablet Take 5 mg by mouth daily.   Yes [provider]  aspirin 81 MG chewable tablet Chew 1 tablet (81 mg total) by mouth daily. 09/21/16  Yes Graciella Freer, PA-C  atorvastatin (LIPITOR) 20 MG tablet Take 1 tablet (20 mg total) by mouth daily at 6 PM. 09/21/16  Yes Tillery, Mariam Dollar, PA-C  buPROPion (WELLBUTRIN XL) 150 MG 24 hr tablet Take 1 tablet (150 mg total) by mouth daily. 12/06/16  Yes Nedrud, Jeanella Flattery, MD  carvedilol (COREG) 3.125 MG tablet Take 1 tablet (3.125 mg total) by mouth 2 (two) times daily. 12/06/16 12/06/17 Yes Valentino Nose, MD  digoxin (LANOXIN) 0.125 MG tablet Take 1 tablet (0.125 mg total) by mouth daily. 09/21/16  Yes Graciella Freer, PA-C  furosemide (LASIX) 40 MG tablet Take 40 mg by mouth 2 (two) times daily.   Yes [provider]  hydrALAZINE (APRESOLINE) 25 MG tablet Take 1 tablet (25 mg total) by mouth 3 (three) times daily. 09/21/16  Yes Graciella Freer, PA-C  insulin glargine (LANTUS) 100 UNIT/ML injection Inject 20 units into skin each night. Check BG daily. If CBG>300 can take 40 units. Patient taking differently: Inject 20-40 Units into the skin daily as needed (high blood sugar).  12/07/16  Yes NedrudJeanella Flattery, MD  isosorbide mononitrate (IMDUR) 30 MG 24 hr tablet Take 1 tablet (30 mg total) by mouth daily.  09/21/16  Yes Graciella Freer, PA-C  metFORMIN (GLUCOPHAGE) 1000 MG tablet Take 1 tablet (1,000 mg total) by mouth 2 (two) times daily with a meal. For high blood sugar control 08/13/16  Yes Maxie Barb, MD  potassium chloride SA (K-DUR,KLOR-CON) 20 MEQ tablet Take 2 tablets (40 mEq total) by mouth daily. 12/06/16  Yes NedrudJeanella Flattery, MD  rivaroxaban (XARELTO) 20 MG TABS tablet Take 1 tablet (20 mg total) by mouth daily with supper. 12/26/16  Yes Nedrud, Jeanella Flattery, MD  sacubitril-valsartan (ENTRESTO) 24-26 MG Take 1 tablet by mouth 2 (two) times daily. 10/19/16  Yes Laurey Morale, MD  sertraline (ZOLOFT) 100 MG tablet Take 100 mg by mouth at bedtime.    Yes [provider]  traZODone (DESYREL) 100 MG tablet Take 1 tablet (100 mg total) by mouth at bedtime. For sleep 12/27/12  Yes Armandina Stammer I, NP     Vital Signs: BP 114/72 (BP Location: Right Arm)   Pulse 79   Temp 97.6 F (36.4 C) (Oral)   Resp 18   Ht 6' 2.5" (1.892 m)   Wt 156 lb 8.4 oz (71 kg)   SpO2 98%   BMI 19.83 kg/m   Physical Exam  Constitutional: He is oriented to person, place, and time.  Musculoskeletal: Normal range of motion.  Neurological: He is alert and oriented to person, place, and time.  Skin: Skin is warm and dry.  site of abscess is clean and dry NT no bleeding OP pus/purulent  50 cc in bag No other record of OP since 10/28 + MRSA  Psychiatric: He has a normal mood and affect. His behavior is normal.  Nursing note and vitals reviewed.   Imaging: Ct Pelvis W Contrast  Result Date: 02/20/2017 CLINICAL DATA:  Status post percutaneous drainage of prostate abscess. EXAM: CT PELVIS WITH CONTRAST TECHNIQUE: Multidetector CT imaging of the pelvis was performed using the standard protocol following the bolus administration of intravenous contrast. CONTRAST:  ISOVUE-300 IOPAMIDOL (ISOVUE-300) INJECTION 61% COMPARISON:  02/18/2017 FINDINGS: Urinary Tract: Foley catheter decompresses  the urinary bladder. Despite the decompressed state, bladder wall appears circumferentially thickened. Gas in the bladder lumen is compatible with the presence of the catheter. Bowel:  Unremarkable. Vascular/Lymphatic: Atherosclerotic calcification noted distal aorta and iliac arteries. No pelvic sidewall lymphadenopathy. Reproductive: Prostate gland is enlarged and ill-defined. Right parasacral percutaneous drainage catheter is noted with the distal loop formed in the right aspect of the prostate gland. The right-sided intra prostatic fluid collection is markedly decompressed in the interval. There is persistence of the rim enhancing fluid collection in the left prostate gland extending posteriorly towards the rectum. This component measures 5.3 x 3.1 cm today, similar to 5.0 x 3.5 cm on the study from 2 days ago. Other: Edema is noted in the soft tissues of the pelvic for and body wall. Musculoskeletal: Bone windows reveal no worrisome lytic or sclerotic osseous lesions. IMPRESSION: 1. Interval decompression of the right component of the complex prostatic abscess. The rim enhancing fluid collection on the left extending posteriorly towards the rectum appears similar in size to the study from 2 days ago suggesting that it may not be drained by the existing catheter. Electronically Signed   By: Kennith Center M.D.   On: 02/20/2017 16:14   Ct Abdomen Pelvis W Contrast  Result Date: 02/18/2017 CLINICAL DATA:  Unspecified abdominal pain EXAM: CT ABDOMEN AND PELVIS WITH CONTRAST TECHNIQUE: Multidetector CT imaging of the abdomen and pelvis was performed using the standard protocol following bolus administration of intravenous contrast. CONTRAST:  75mL ISOVUE-300 IOPAMIDOL (ISOVUE-300) INJECTION 61% COMPARISON:  CT from yesterday FINDINGS: Lower chest: Moderate right and small left pleural effusion. Patient has history of CHF. Mild atelectasis. Hepatobiliary: Hepatic steatosis. No mass lesion is seen.No evidence of  gallstone. Mild fluid density around the gallbladder which is likely secondary. No over distention or gallbladder wall thickening. Pancreas: Unremarkable. Spleen: Unremarkable. Adrenals/Urinary Tract: Negative adrenals. No hydronephrosis or stone. Marked circumferential bladder wall thickening. The bladder is decompressed by Foley catheter. Stomach/Bowel: Proctitis with circumferential wall thickening. Mid and distal appendiceal wall thickening to 13 mm. No discrete surrounding fat stranding. Vascular/Lymphatic: No acute vascular abnormality. Diffuse atherosclerotic calcification of the aorta and its branches. No mass or adenopathy. Reproductive:There is multi septated fluid collection in the enlarged and ill-defined prostate, in total measuring up to 6.4 cm. The abscess bulges left posterior into the recto prostatic fat plane. Other: No ascites or pneumoperitoneum. Musculoskeletal: Degenerative changes. No acute or aggressive finding. IMPRESSION: 1. Cystitis and prostatitis with multi septated prostatic abscess measuring up to 6.4 cm. The abscess is tracking towards the inflamed rectum without signs of fistulization. 2. Nonprogressive appendicitis and proctitis. 3. Pleural effusions that are small on the left and moderate on the right. Electronically Signed   By: Marnee Spring M.D.   On: 02/18/2017 14:26   Ct Renal Stone Study  Result Date: 02/18/2017 CLINICAL DATA:  Acute onset of generalized weakness and loss of appetite. Incontinence for 5 days. Initial  encounter. EXAM: CT ABDOMEN AND PELVIS WITHOUT CONTRAST TECHNIQUE: Multidetector CT imaging of the abdomen and pelvis was performed following the standard protocol without IV contrast. COMPARISON:  Abdominal ultrasound performed 08/08/2016, and CT of the abdomen and pelvis performed 10/23/2008 FINDINGS: Lower chest: Small bilateral pleural effusions are noted. Mild hazy bibasilar opacities may reflect interstitial edema. The visualized portions of the  mediastinum are unremarkable. Hepatobiliary: The liver is unremarkable in appearance. The gallbladder is unremarkable in appearance. The common bile duct remains normal in caliber. Pancreas: The pancreas is within normal limits. Spleen: The spleen is unremarkable in appearance. Adrenals/Urinary Tract: The adrenal glands are unremarkable in appearance. There is wall thickening along the left renal pelvis, with minimal left-sided hydronephrosis. No distal obstructing stone is seen. This is thought to reflect left-sided ureteritis. Mild nonspecific perinephric stranding is noted bilaterally. The right kidney is unremarkable in appearance. No renal or ureteral stones are identified. Stomach/Bowel: The appendix is distended to 1.2 cm in maximal diameter, with mild surrounding inflammation. Acute appendicitis cannot be excluded, though this may simply be reactive given the pelvic process. Wall thickening is noted along the ascending colon, concerning for an acute infectious or inflammatory process. Wall thickening is also noted at the rectum, with diffuse presacral stranding, concerning for proctitis. The small bowel is grossly unremarkable. The stomach is decompressed unremarkable in appearance. Vascular/Lymphatic: Scattered calcification is seen along the abdominal aorta and its branches. The abdominal aorta is otherwise grossly unremarkable. The inferior vena cava is grossly unremarkable. No retroperitoneal lymphadenopathy is seen. No pelvic sidewall lymphadenopathy is identified. Reproductive: There is diffuse bladder wall thickening, with surrounding soft tissue inflammation, concerning for cystitis. Note is made of an apparent large abscess occupying the prostate, measuring 6.0 x 5.4 x 4.2 cm. This difficult to fully assess without contrast. Surrounding soft tissue inflammation is noted. Other: No additional soft tissue abnormalities are seen. Musculoskeletal: No acute osseous abnormalities are identified. The  visualized musculature is unremarkable in appearance. IMPRESSION: 1. Apparent large abscess occupying the prostate, measuring 6.0 x 5.4 x 4.2 cm. Prostatic abscesses are rare, typically a complication of acute bacterial prostatitis. Surrounding soft tissue inflammation noted. 2. Bladder wall thickening, with surrounding soft tissue inflammation, concerning for cystitis. 3. Wall thickening at the rectum, with diffuse presacral stranding, concerning for associated proctitis. 4. Wall thickening along the ascending colon, concerning for an acute infectious or inflammatory process. 5. Distention of the appendix to 1.2 cm in maximal diameter, with mild surrounding inflammation. Acute appendicitis cannot be excluded, though this may simply reflect the underlying pelvic infection. 6. Wall thickening along the left renal pelvis, with minimal left-sided hydronephrosis, thought to reflect left-sided ureteritis. No obstructing stone seen. 7. Small bilateral pleural effusions. Mild hazy bibasilar airspace opacities may reflect minimal interstitial edema. These results were called by telephone at the time of interpretation on 02/18/2017 at 4:45 am to Dr. Cy BlamerAPRIL PALUMBO, who verbally acknowledged these results. Electronically Signed   By: Roanna RaiderJeffery  Chang M.D.   On: 02/18/2017 04:47   Ct Image Guided Drainage By Percutaneous Catheter  Result Date: 02/18/2017 CLINICAL DATA:  Multiloculated prostatic abscess. Drainage requested. EXAM: CT GUIDED DRAINAGE OF PROSTATIC ABSCESS ANESTHESIA/SEDATION: Intravenous Fentanyl and Versed were administered as conscious sedation during continuous monitoring of the patient's level of consciousness and physiological / cardiorespiratory status by the radiology RN, with a total moderate sedation time of 18 minutes. PROCEDURE: The procedure, risks, benefits, and alternatives were explained to the patient. Questions regarding the procedure were encouraged and answered. The  patient understands and  consents to the procedure. Patient placed prone. Select axial scans through the pelvis obtained. The prostatic collection was localized an appropriate skin entry site was determined and marked. The operative field was prepped with chlorhexidinein a sterile fashion, and a sterile drape was applied covering the operative field. A sterile gown and sterile gloves were used for the procedure. Local anesthesia was provided with 1% Lidocaine. Under CT fluoroscopic guidance, 18 gauge trocar needle advanced into the right posterolateral component of the collection. Purulent material could be aspirated. An Amplatz guidewire advanced easily within the collection, position confirmed on CT. Tract dilated to facilitate placement of a 10 French pigtail drain catheter, formed centrally within the right component of the collection. Catheter secured externally with 0 Prolene suture and StatLock, and placed to external gravity drain bag. A sample of the green peroneal aspirate was sent for Gram stain and culture. The patient tolerated the procedure well. COMPLICATIONS: None immediate FINDINGS: Multiloculated bilateral prostatic abscess was localized. Ten French pigtail drain catheter placed as above. Sample of the aspirate sent for Gram stain and culture. IMPRESSION: 1. Technically successful CT-guided prostatic abscess drain catheter placement. Electronically Signed   By: Corlis Leak M.D.   On: 02/18/2017 16:35    Labs:  CBC:  Recent Labs  02/17/17 2148 02/19/17 0353 02/20/17 0551 02/21/17 0739  WBC 9.4 10.9* 7.1 7.1  HGB 13.2 11.8* 11.9* 11.7*  HCT 37.4* 34.2* 35.0* 34.7*  PLT 398 342 343 311    COAGS: No results for input(s): INR, APTT in the last 8760 hours.  BMP:  Recent Labs  02/17/17 2148 02/19/17 0353 02/20/17 0551 02/21/17 0739  NA 128* 133* 133* 134*  K 4.0 3.8 4.1 3.9  CL 97* 104 107 109  CO2 17* 19* 16* 18*  GLUCOSE 435* 71 82 66  BUN 22* 25* 26* 27*  CALCIUM 8.4* 7.6* 7.7* 7.9*    CREATININE 1.55* 1.39* 1.44* 1.22  GFRNONAA 46* 53* 51* >60  GFRAA 54* >60 59* >60    LIVER FUNCTION TESTS:  Recent Labs  12/04/16 1120 12/05/16 0226 02/17/17 2148 02/19/17 0353  BILITOT 1.1 1.5* 0.8 1.0  AST 61* 33 21 19  ALT 29 23 14* 13*  ALKPHOS 101 87 126 93  PROT 7.4 6.4* 8.3* 6.3*  ALBUMIN 2.7* 2.3* 2.2* 1.7*    Assessment and Plan:  Prostate abscess Draining well Feeling better 50 cc in Bag today Will follow   Electronically Signed: Lori Liew A, PA-C 02/21/2017, 2:30 PM   I spent a total of 15 Minutes at the the patient's bedside AND on the patient's hospital floor or unit, greater than 50% of which was counseling/coordinating care for prostate abscess

## 2017-02-21 NOTE — Consult Note (Signed)
   [facility] CM Inpatient Consult   02/21/2017  [name] [birth year] [id number]      Referral for [facility] Care Management received.   Spoke with patient at bedside to confirm Primary Care Provider. He goes to [facility] for Primary Care.  [facility] not a [facility] Provider. Therefore, he is not eligible for [facility] Care Management at this time.  Made inpatient RNCM aware.   Raiford Noble, MSN-Ed, RN,BSN [facility] Liaison [phone]

## 2017-02-21 NOTE — Evaluation (Signed)
Physical Therapy Evaluation Patient Details Name: [name] Vondrasek MRN: [id number] DOB: [birth year] Today's Date: 02/21/2017   History of Present Illness  61 y.o.male,[name] hypertension, dm2, cad, CHF 10-15%) apparently c/o burning [name] urination starting 2 days ago. Pt has had diarrhea for the past 4 days. Pt notes some generalized abdominal discomfort. Pt denies fever, chills, n/v, constipation, brbpr. Pt presented to ED due to diarrhea, and dysuria. + Prostate abscess s/p IR drain placement 10/27.  Clinical Impression  Orders received for PT evaluation. Patient demonstrates deficits in functional mobility as indicated below. Will benefit from continued skilled PT to address deficits and maximize function. Will see as indicated and progress as tolerated.  Will likely need ST SNF upon acute discharge for safety and functional recovery.    Follow Up Recommendations SNF;Supervision/Assistance - 24 hour    Equipment Recommendations  Rolling walker with 5" wheels    Recommendations for Other Services       Precautions / Restrictions Precautions Precautions: Fall Restrictions Weight Bearing Restrictions: No      Mobility  Bed Mobility Overal bed mobility: Needs Assistance Bed Mobility: Supine to Sit;Sit to Supine     Supine to sit: Min assist Sit to supine: Min assist   General bed mobility comments: min assist with pull to sit and reposition at EOB, min assist to elevate LEs to return to bed. increased time required  Transfers Overall transfer level: Needs assistance Equipment used: Rolling walker (2 wheeled) Transfers: Sit to/from Stand Sit to Stand: Min assist         General transfer comment: min assist to power up from bed with HHA and RW, assist from toilet using grab bars and RW  Ambulation/Gait Ambulation/Gait assistance: Min assist;Mod assist (Moderate assist with BLE buckling when mobilizing to bathroo) Ambulation Distance (Feet): 24 Feet Assistive device:  Rolling walker (2 wheeled) Gait Pattern/deviations: Step-to pattern;Decreased stride length;Shuffle;Trunk flexed Gait velocity: decreased   General Gait Details: patient with noted instability, bilaterl LE buckling with movement, unsure weakness vs pain response.  Stairs            Wheelchair Mobility    Modified Rankin (Stroke Patients Only)       Balance Overall balance assessment: Needs assistance Sitting-balance support: Feet supported Sitting balance-Leahy Scale: Fair     Standing balance support: During functional activity Standing balance-Leahy Scale: Poor Standing balance comment: BLE buckling with standing for ~30 seconds during pericare                             Pertinent Vitals/Pain Pain Assessment: Faces Pain Score: 3  (3 at rest, 6 with activity) Faces Pain Scale: Hurts even more Pain Location: groin and perianal region Pain Descriptors / Indicators: Discomfort;Grimacing;Guarding Pain Intervention(s): Limited activity within patient's tolerance;Monitored during session;Repositioned;Patient requesting pain meds-RN notified;Relaxation    Home Living Family/patient expects to be discharged to:: Skilled nursing facility Living Arrangements: Non-relatives/Friends Available Help at Discharge: Available PRN/intermittently;Friend(s) Type of Home: House Home Access: Stairs to enter       Home Equipment: Cane - single point;Walker - 2 wheels Additional Comments: patient distracted by pain and urgency for BM during attempts to obtain history    Prior Function Level of Independence: Needs assistance   Gait / Transfers Assistance Needed: uses a cane at home, has had difficulty walking recently due to pain           Hand Dominance   Dominant Hand: Right  Extremity/Trunk Assessment   Upper Extremity Assessment Upper Extremity Assessment: Defer to OT evaluation    Lower Extremity Assessment Lower Extremity Assessment: Generalized  weakness (Noted Bilateral LE R>L weakness/buckling with movement)       Communication   Communication: HOH  Cognition Arousal/Alertness: Awake/alert Behavior During Therapy: Flat affect;Impulsive Overall Cognitive Status: No family/caregiver present to determine baseline cognitive functioning                                        General Comments      Exercises     Assessment/Plan    PT Assessment Patient needs continued PT services  PT Problem List Decreased strength;Decreased activity tolerance;Decreased balance;Decreased mobility;Decreased coordination;Decreased safety awareness;Pain       PT Treatment Interventions DME instruction;Gait training;Stair training;Functional mobility training;Therapeutic activities;Therapeutic exercise;Balance training;Patient/family education    PT Goals (Current goals can be found in the Care Plan section)  Acute Rehab PT Goals Patient Stated Goal: to not be in pain PT Goal Formulation: With patient Time For Goal Achievement: 03/07/17 Potential to Achieve Goals: Fair    Frequency Min 2X/week   Barriers to discharge Decreased caregiver support;Inaccessible home environment      Co-evaluation               AM-PAC PT "6 Clicks" Daily Activity  Outcome Measure Difficulty turning over in bed (including adjusting bedclothes, sheets and blankets)?: Unable Difficulty moving from lying on back to sitting on the side of the bed? : Unable Difficulty sitting down on and standing up from a chair with arms (e.g., wheelchair, bedside commode, etc,.)?: Unable Help needed moving to and from a bed to chair (including a wheelchair)?: A Little Help needed walking in hospital room?: A Little Help needed climbing 3-5 steps with a railing? : A Lot 6 Click Score: 11    End of Session Equipment Utilized During Treatment: Gait belt Activity Tolerance: Patient limited by pain Patient left: in bed;with call bell/phone within  reach;with bed alarm set Nurse Communication: Mobility status (BM/incontinence) PT Visit Diagnosis: Unsteadiness on feet (R26.81);Difficulty in walking, not elsewhere classified (R26.2);Pain    Time: 1202-1224 PT Time Calculation (min) (ACUTE ONLY): 22 min   Charges:   PT Evaluation $PT Eval Moderate Complexity: 1 Mod     PT G Codes:        Charlotte Crumb, PT DPT  Board Certified Neurologic Specialist [phone]   Fabio Asa 02/21/2017, 1:46 PM

## 2017-02-21 NOTE — Progress Notes (Signed)
PROGRESS NOTE    [name]  BMB:[id number] DOB: 11-Jul-1955 DOA: 02/17/2017 PCP: [facility]      Brief Narrative:  61 yo M with CHF EF 10-15%, HTN, IDDM who presents with few days dysuria, found to have sepsis from prostatitis, prostatic abscess.     Assessment & Plan:  Principal Problem:   Prostate abscess Active Problems:   Hyponatremia   Hypertensive heart disease with chronic systolic congestive heart failure (HCC)   Type 2 diabetes mellitus with complication (HCC)   Chronic systolic CHF (congestive heart failure) (HCC)   Pulmonary embolus (HCC)   AKI (acute kidney injury) (HCC)   MRSA Prostate abscess Drain in place now. CT pelvis from yesterday still shows 5cm abscess in left lobe of prostate.  Urology have signed off.  Culture growing MRSA, favorable suscept profile. -IR following, appreciate cares -Continue vancomyin    Appendicitis ruled out  Mild AKI Improved with fluids. -Hold furos, Entresto for now -Strict I/Os, daily weights  Hyponatremia Improved to baseline  Chronic systolic CHF EF 10-15%, not candidate for Adv therapies per HF clinic notes. -Hold furosemide and Entresto for now given soft BP, AKI -Continue aspirin, statin, dig -Hold BB  Hypertension Hypotensive -Bolus with IV fluids now, may need additional fluids overnight -Hold BB, Entresto, Imdur, hydralazine, diuretic for now until hemodynamics  Diabetes -Continue Glargine (lower than home dose) -SSI with meals  Recent PE -Continue Xarelto  Other medications -Continue Abilify -Continue Wllbutrin, trazodone, sertraline  Alcohol use Denies every having withdrawals.  Does not appears withdrawing now.      DVT prophylaxis: Xarelto Code Status: FULL Family Communication: None present Disposition Plan: PT eval then likely home with oral antibiotics to home or SNF pending PT eval in 2-3 days.   Dispo at this point pending IR re-evaluation for second  drain.   Consultants:   Urology, IR, Gen Surg  Procedures:   Percutaneous drain placement 02/19/2017  Antimicrobials:   Vancomycin 10/28 >>  Ceftriaxone 10/27 >> 10/29   Subjective: Feeling okay.  A little out of it.  At first told me it was 11PM at night, wanted to know if the game was on.  Pain in pelvis better.  No new fever.    Objective: Vitals:   02/21/17 0500 02/21/17 0553 02/21/17 1002 02/21/17 1503  BP:  97/81 114/72 116/70  Pulse:  77 79 78  Resp:  18 18 18   Temp:  97.6 F (36.4 C) 97.6 F (36.4 C) 98.1 F (36.7 C)  TempSrc:  Oral Oral Oral  SpO2:  98% 98% 97%  Weight: 71 kg (156 lb 8.4 oz)     Height:        Intake/Output Summary (Last 24 hours) at 02/21/17 1556 Last data filed at 02/21/17 1502  Gross per 24 hour  Intake          1365.33 ml  Output              401 ml  Net           964.33 ml   Filed Weights   02/17/17 2140 02/19/17 0602 02/21/17 0500  Weight: 72.6 kg (160 lb) 70.1 kg (154 lb 8 oz) 71 kg (156 lb 8.4 oz)    Examination: General appearance: Thin adult male, alert and in no acute distress.  Tired appearing. Skin: Warm and dry.   No suspicious rashes or lesions. Cardiac: RRR, nl S1-S2, no murmurs appreciated.  Capillary refill is brisk.  JVP normal.  No LE edema.  Respiratory: Normal respiratory rate and rhythm.  CTAB without rales or wheezes. Abdomen: Abdomen soft.  No significant TTP no LLQ tenderness, no rebound. No ascites, distension, hepatosplenomegaly.   MSK: No deformities or effusions. Neuro: Awake and interactive.  A little confused, but orientable.  EOMI, moves all extremities. Speech fluent.         Data Reviewed: I have personally reviewed following labs and imaging studies:  CBC:  Recent Labs Lab 02/17/17 2148 02/19/17 0353 02/20/17 0551 02/21/17 0739  WBC 9.4 10.9* 7.1 7.1  HGB 13.2 11.8* 11.9* 11.7*  HCT 37.4* 34.2* 35.0* 34.7*  MCV 83.5 83.6 84.7 84.4  PLT 398 342 343 311   Basic Metabolic  Panel:  Recent Labs Lab 02/17/17 2148 02/19/17 0353 02/20/17 0551 02/21/17 0739  NA 128* 133* 133* 134*  K 4.0 3.8 4.1 3.9  CL 97* 104 107 109  CO2 17* 19* 16* 18*  GLUCOSE 435* 71 82 66  BUN 22* 25* 26* 27*  CREATININE 1.55* 1.39* 1.44* 1.22  CALCIUM 8.4* 7.6* 7.7* 7.9*   GFR: Estimated Creatinine Clearance: 63 mL/min (by C-G formula based on SCr of 1.22 mg/dL). Liver Function Tests:  Recent Labs Lab 02/17/17 2148 02/19/17 0353  AST 21 19  ALT 14* 13*  ALKPHOS 126 93  BILITOT 0.8 1.0  PROT 8.3* 6.3*  ALBUMIN 2.2* 1.7*    Recent Labs Lab 02/17/17 2148  LIPASE 92*   No results for input(s): AMMONIA in the last 168 hours. Coagulation Profile: No results for input(s): INR, PROTIME in the last 168 hours. Cardiac Enzymes: No results for input(s): CKTOTAL, CKMB, CKMBINDEX, TROPONINI in the last 168 hours. BNP (last 3 results) No results for input(s): PROBNP in the last 8760 hours. HbA1C: No results for input(s): HGBA1C in the last 72 hours. CBG:  Recent Labs Lab 02/20/17 1651 02/20/17 2303 02/21/17 0740 02/21/17 1014 02/21/17 1205  GLUCAP 259* 98 65 159* 160*   Lipid Profile: No results for input(s): CHOL, HDL, LDLCALC, TRIG, CHOLHDL, LDLDIRECT in the last 72 hours. Thyroid Function Tests: No results for input(s): TSH, T4TOTAL, FREET4, T3FREE, THYROIDAB in the last 72 hours. Anemia Panel: No results for input(s): VITAMINB12, FOLATE, FERRITIN, TIBC, IRON, RETICCTPCT in the last 72 hours. Urine analysis:    Component Value Date/Time   COLORURINE YELLOW 02/18/2017 0311   APPEARANCEUR TURBID (A) 02/18/2017 0311   LABSPEC 1.017 02/18/2017 0311   PHURINE 6.0 02/18/2017 0311   GLUCOSEU >=500 (A) 02/18/2017 0311   HGBUR MODERATE (A) 02/18/2017 0311   BILIRUBINUR NEGATIVE 02/18/2017 0311   KETONESUR NEGATIVE 02/18/2017 0311   PROTEINUR >=300 (A) 02/18/2017 0311   UROBILINOGEN 2.0 (H) 02/20/2014 1339   NITRITE NEGATIVE 02/18/2017 0311   LEUKOCYTESUR LARGE  (A) 02/18/2017 0311   Sepsis Labs: @LABRCNTIP (procalcitonin:4,lacticidven:4)  ) Recent Results (from the past 240 hour(s))  Blood culture (routine x 2)     Status: None (Preliminary result)   Collection Time: 02/18/17  4:40 AM  Result Value Ref Range Status   Specimen Description BLOOD LEFT ARM  Final   Special Requests   Final    BOTTLES DRAWN AEROBIC AND ANAEROBIC Blood Culture adequate volume   Culture NO GROWTH 3 DAYS  Final   Report Status PENDING  Incomplete  Blood culture (routine x 2)     Status: None (Preliminary result)   Collection Time: 02/18/17  5:09 AM  Result Value Ref Range Status   Specimen Description BLOOD RIGHT ARM  Final  Special Requests   Final    BOTTLES DRAWN AEROBIC ONLY Blood Culture adequate volume   Culture NO GROWTH 3 DAYS  Final   Report Status PENDING  Incomplete  Aerobic/Anaerobic Culture (surgical/deep wound)     Status: None (Preliminary result)   Collection Time: 02/18/17  4:13 PM  Result Value Ref Range Status   Specimen Description ABSCESS DRAIN  Final   Special Requests NONE  Final   Gram Stain   Final    ABUNDANT WBC PRESENT, PREDOMINANTLY PMN ABUNDANT GRAM POSITIVE COCCI IN CLUSTERS    Culture   Final    ABUNDANT METHICILLIN RESISTANT STAPHYLOCOCCUS AUREUS NO ANAEROBES ISOLATED; CULTURE IN PROGRESS FOR 5 DAYS    Report Status PENDING  Incomplete   Organism ID, Bacteria METHICILLIN RESISTANT STAPHYLOCOCCUS AUREUS  Final      Susceptibility   Methicillin resistant staphylococcus aureus - MIC*    CIPROFLOXACIN <=0.5 SENSITIVE Sensitive     ERYTHROMYCIN >=8 RESISTANT Resistant     GENTAMICIN <=0.5 SENSITIVE Sensitive     OXACILLIN >=4 RESISTANT Resistant     TETRACYCLINE <=1 SENSITIVE Sensitive     VANCOMYCIN 1 SENSITIVE Sensitive     TRIMETH/SULFA <=10 SENSITIVE Sensitive     CLINDAMYCIN <=0.25 SENSITIVE Sensitive     RIFAMPIN <=0.5 SENSITIVE Sensitive     Inducible Clindamycin NEGATIVE Sensitive     * ABUNDANT METHICILLIN  RESISTANT STAPHYLOCOCCUS AUREUS  Culture, blood (routine x 2)     Status: None (Preliminary result)   Collection Time: 02/20/17 12:19 PM  Result Value Ref Range Status   Specimen Description BLOOD A-LINE  Final   Special Requests IN PEDIATRIC BOTTLE Blood Culture adequate volume  Final   Culture NO GROWTH 1 DAY  Final   Report Status PENDING  Incomplete         Radiology Studies: Ct Pelvis W Contrast  Result Date: 02/20/2017 CLINICAL DATA:  Status post percutaneous drainage of prostate abscess. EXAM: CT PELVIS WITH CONTRAST TECHNIQUE: Multidetector CT imaging of the pelvis was performed using the standard protocol following the bolus administration of intravenous contrast. CONTRAST:  ISOVUE-300 IOPAMIDOL (ISOVUE-300) INJECTION 61% COMPARISON:  02/18/2017 FINDINGS: Urinary Tract: Foley catheter decompresses the urinary bladder. Despite the decompressed state, bladder wall appears circumferentially thickened. Gas in the bladder lumen is compatible with the presence of the catheter. Bowel:  Unremarkable. Vascular/Lymphatic: Atherosclerotic calcification noted distal aorta and iliac arteries. No pelvic sidewall lymphadenopathy. Reproductive: Prostate gland is enlarged and ill-defined. Right parasacral percutaneous drainage catheter is noted with the distal loop formed in the right aspect of the prostate gland. The right-sided intra prostatic fluid collection is markedly decompressed in the interval. There is persistence of the rim enhancing fluid collection in the left prostate gland extending posteriorly towards the rectum. This component measures 5.3 x 3.1 cm today, similar to 5.0 x 3.5 cm on the study from 2 days ago. Other: Edema is noted in the soft tissues of the pelvic for and body wall. Musculoskeletal: Bone windows reveal no worrisome lytic or sclerotic osseous lesions. IMPRESSION: 1. Interval decompression of the right component of the complex prostatic abscess. The rim enhancing fluid  collection on the left extending posteriorly towards the rectum appears similar in size to the study from 2 days ago suggesting that it may not be drained by the existing catheter. Electronically Signed   By: Kennith Center M.D.   On: 02/20/2017 16:14        Scheduled Meds: . ARIPiprazole  5 mg Oral  Daily  . aspirin  81 mg Oral Daily  . atorvastatin  20 mg Oral q1800  . buPROPion  150 mg Oral Daily  . Chlorhexidine Gluconate Cloth  6 each Topical Q0600  . digoxin  0.125 mg Oral Daily  . insulin aspart  0-9 Units Subcutaneous TID WC  . insulin glargine  10 Units Subcutaneous Daily  . mupirocin ointment  1 application Nasal BID  . phenazopyridine  200 mg Oral TID WC  . rivaroxaban  20 mg Oral Q supper  . sertraline  100 mg Oral QHS  . sodium chloride flush  5 mL Intravenous Q8H  . traZODone  100 mg Oral QHS   Continuous Infusions: . vancomycin 750 mg (02/21/17 1244)     LOS: 3 days    Time spent: 25 minutes    Alberteen Sam, MD [facility] Pager [phone]  If 7PM-7AM, please contact night-coverage [email or web address] Password [email or web address] 02/21/2017, 3:56 PM

## 2017-02-22 DIAGNOSIS — N179 Acute kidney failure, unspecified: Secondary | ICD-10-CM

## 2017-02-22 DIAGNOSIS — I110 Hypertensive heart disease with heart failure: Secondary | ICD-10-CM

## 2017-02-22 DIAGNOSIS — I5022 Chronic systolic (congestive) heart failure: Secondary | ICD-10-CM

## 2017-02-22 DIAGNOSIS — Z794 Long term (current) use of insulin: Secondary | ICD-10-CM

## 2017-02-22 DIAGNOSIS — E118 Type 2 diabetes mellitus with unspecified complications: Secondary | ICD-10-CM

## 2017-02-22 DIAGNOSIS — N412 Abscess of prostate: Secondary | ICD-10-CM

## 2017-02-22 DIAGNOSIS — I2609 Other pulmonary embolism with acute cor pulmonale: Secondary | ICD-10-CM

## 2017-02-22 LAB — COMPREHENSIVE METABOLIC PANEL
ALT: 27 U/L (ref 17–63)
AST: 44 U/L — ABNORMAL HIGH (ref 15–41)
Albumin: 1.8 g/dL — ABNORMAL LOW (ref 3.5–5.0)
Alkaline Phosphatase: 146 U/L — ABNORMAL HIGH (ref 38–126)
Anion gap: 8 (ref 5–15)
BUN: 24 mg/dL — ABNORMAL HIGH (ref 6–20)
CO2: 17 mmol/L — ABNORMAL LOW (ref 22–32)
Calcium: 7.9 mg/dL — ABNORMAL LOW (ref 8.9–10.3)
Chloride: 108 mmol/L (ref 101–111)
Creatinine, Ser: 1.12 mg/dL (ref 0.61–1.24)
GFR calc Af Amer: >60 mL/min (ref >60)
GFR calc non Af Amer: >60 mL/min (ref >60)
Glucose, Bld: 195 mg/dL — ABNORMAL HIGH (ref 65–99)
Potassium: 4.2 mmol/L (ref 3.5–5.1)
Sodium: 133 mmol/L — ABNORMAL LOW (ref 135–145)
Total Bilirubin: 0.7 mg/dL (ref 0.3–1.2)
Total Protein: 6.9 g/dL (ref 6.5–8.1)

## 2017-02-22 LAB — CBC
HCT: 35.7 % — ABNORMAL LOW (ref 39.0–52.0)
Hemoglobin: 12.2 g/dL — ABNORMAL LOW (ref 13.0–17.0)
MCH: 28.8 pg (ref 26.0–34.0)
MCHC: 34.2 g/dL (ref 30.0–36.0)
MCV: 84.2 fL (ref 78.0–100.0)
Platelets: 330 10*3/uL (ref 150–400)
RBC: 4.24 MIL/uL (ref 4.22–5.81)
RDW: 13 % (ref 11.5–15.5)
WBC: 6.6 10*3/uL (ref 4.0–10.5)

## 2017-02-22 LAB — GLUCOSE, CAPILLARY
Glucose-Capillary: 227 mg/dL — ABNORMAL HIGH (ref 65–99)
Glucose-Capillary: 257 mg/dL — ABNORMAL HIGH (ref 65–99)
Glucose-Capillary: 85 mg/dL (ref 65–99)
Glucose-Capillary: 183 mg/dL — ABNORMAL HIGH (ref 65–99)

## 2017-02-22 LAB — MAGNESIUM: Magnesium: 1.6 mg/dL — ABNORMAL LOW (ref 1.7–2.4)

## 2017-02-22 MED ORDER — CARVEDILOL 3.125 MG PO TABS
3.1250 mg | ORAL_TABLET | Freq: Two times a day (BID) | ORAL | Status: DC
  Administered 2017-02-22 – 2017-02-28 (×13): 3.125 mg via ORAL
  Filled 2017-02-22 (×13): qty 1

## 2017-02-22 MED ORDER — NYSTATIN 100000 UNIT/ML MT SUSP
5.0000 mL | Freq: Four times a day (QID) | OROMUCOSAL | Status: DC
  Administered 2017-02-22 – 2017-02-28 (×22): 500000 [IU] via ORAL
  Filled 2017-02-22 (×22): qty 5

## 2017-02-22 MED ORDER — INSULIN GLARGINE 100 UNIT/ML ~~LOC~~ SOLN
16.0000 [IU] | Freq: Every day | SUBCUTANEOUS | Status: DC
  Administered 2017-02-23: 16 [IU] via SUBCUTANEOUS
  Filled 2017-02-22 (×2): qty 0.16

## 2017-02-22 MED ORDER — INSULIN GLARGINE 100 UNIT/ML ~~LOC~~ SOLN: 18.0000 [IU] | Freq: Every day | SUBCUTANEOUS | Status: DC

## 2017-02-22 NOTE — Progress Notes (Signed)
Patient's pasrr under manual review.   [name] LCSWA [phone]

## 2017-02-22 NOTE — Consult Note (Signed)
[facility]    Date of Admission:  02/17/2017     Total days of antibiotics  5  Vancomycin 10/27 >>  Ceftriaxone 10/27 >>  10/28  Zosyn 10/27  Metronidazole 10/27            Reason for Consult: MRSA prostate abscess     Referring Provider: Dr. Butler Denmark  Primary Care Provider: [facility]   Assessment: 61 yo male with MRSA prostate abscess and prostatitis with tracking to his rectum w/o signs of fistula formation. Diarrhea resolved. Repeat CT yesterday still with 5cm abscess s/p percutaneous drain. He also has untreated Hepatitis C infection. He has been + cocaine on several urine screens with ER/Hospital admissions this year. Liver on CT with steatosis. AST slightly elevated at 44 with AlkPhos 146   Plan: 1. Continue drain per IR team.  2. Continue IV vancomycin with residual abscess in place. May require a few weeks of IV induction depending on efficacy of drain vs surgery; however with recent history of SA, uncertain if this would be a viable plan. Appreciate pharmacy's assistance.  3. Will check Hep C RNA and repeat HIV today.  4. I let the HF Navigator know that [name] was admitted currently as he has severely depressed EF 10-15% and they have had a difficult time getting in touch with him since D/C from SNF.     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 ARIPiprazole  5 mg Oral Daily  . aspirin  81 mg Oral Daily  . atorvastatin  20 mg Oral q1800  . buPROPion  150 mg Oral Daily  . Chlorhexidine Gluconate Cloth  6 each Topical Q0600  . digoxin  0.125 mg Oral Daily  . feeding supplement (GLUCERNA SHAKE)  237 mL Oral TID BM  . insulin aspart  0-9  Units Subcutaneous TID WC  . insulin glargine  10 Units Subcutaneous Daily  . mupirocin ointment  1 application Nasal BID  . nystatin  5 mL Oral QID  . phenazopyridine  200 mg Oral TID WC  . rivaroxaban  20 mg Oral Q supper  . sertraline  100 mg Oral QHS  . sodium chloride flush  5 mL Intravenous Q8H  . traZODone  100 mg Oral QHS    HPI: [name] is a 61 y.o. male admitted to the hospital on 02/17/2017 with 4d history of diarrhea and dysuria. Denied fevers/chills. Recently discharged in early September from [place] nursing [facility].   Hospital Course: WBC 9.4k, Creat 1.55. CT a/p revealed large abscess in prostate (6.0 x 5.4 x 4.2 cm) as well as concern for proctitis as well as distention of the appendix 1.2 cm with mild surrounding inflammation concerning for acute appendicitis. Dr. Janee Morn with Gen surg evaluated and no need for appendectomy now as it is likely r/t inflammation and expected to resolve. Evaluated by Dr. Liliane Shi with urology and IR team - decision was made to place drain for evacuation. WBC normalized after this was done; culture + for MRSA. Started on Ceftriaxone initially which was changed to Vancomycin.   ED visit noted in May 2018 for abscess to anogenital area which was incised and  sent home on Bactrim. H/O insulin dependent diabetes with last A1C > 9% .  So far he tells me that he has improved a bit since coming to the hospital. Abdominal pain improved but still with some dysuria (foley in place). Denies fevers, chills. Tells me he knew he had Hep C in the past but no treatment for it. Last time he used cocaine was approx 1 month ago. No history of injection drug use. Daily cigarette smoker.   Review of Systems: Review of Systems  Constitutional: Negative for chills and fever.  HENT: Negative for tinnitus.   Respiratory: Negative for cough and shortness of breath.   Cardiovascular: Negative for chest pain, orthopnea and leg swelling.  Gastrointestinal:  Negative for abdominal pain and diarrhea.  Genitourinary: Positive for dysuria.  Skin: Negative for rash.  Neurological: Positive for weakness. Negative for dizziness and headaches.  Psychiatric/Behavioral: Positive for substance abuse.    Past Medical History:  Diagnosis Date  . CHF (congestive heart failure) (HCC)   . Coronary artery disease   . Depression   . Diabetes mellitus   . Hypertension   . Mental disorder   . MI (myocardial infarction) [facility]) 30   age 89 per patient    Social History  Substance Use Topics  . Smoking status: Current Every Day Smoker    Packs/day: 0.50    Years: 40.00    Types: Cigarettes  . Smokeless tobacco: Never Used  . Alcohol use Yes     Comment: 4 40 oz beers daily    Family History  Problem Relation Age of Onset  . Heart attack Father        >60s  . Hypertension Father   . Diabetes Father   . Heart attack Sister   . Hypertension Mother    No Known Allergies  OBJECTIVE: Blood pressure 109/68, pulse 87, temperature 97.7 F (36.5 C), temperature source Oral, resp. rate 18, height 6' 2.5" (1.892 m), weight 156 lb 6.4 oz (70.9 kg), SpO2 98 %.  Physical Exam  Constitutional: He is oriented to person, place, and time.  Eyes: No scleral icterus.  Cardiovascular: Normal rate and regular rhythm.   Murmur heard. Pulmonary/Chest: Effort normal and breath sounds normal.  Abdominal: Soft. There is tenderness (Epigastric).  Lymphadenopathy:    He has no cervical adenopathy.  Neurological: He is oriented to person, place, and time.  Lethargic. Difficult to understand sometimes as he falls asleep.     Lab Results Lab Results  Component Value Date   WBC 6.6 02/22/2017   HGB 12.2 (L) 02/22/2017   HCT 35.7 (L) 02/22/2017   MCV 84.2 02/22/2017   PLT 330 02/22/2017    Lab Results  Component Value Date   CREATININE 1.12 02/22/2017   BUN 24 (H) 02/22/2017   NA 133 (L) 02/22/2017   K 4.2 02/22/2017   CL 108 02/22/2017   CO2 17 (L)  02/22/2017    Lab Results  Component Value Date   ALT 27 02/22/2017   AST 44 (H) 02/22/2017   ALKPHOS 146 (H) 02/22/2017   BILITOT 0.7 02/22/2017     Microbiology: Recent Results (from the past 240 hour(s))  Blood culture (routine x 2)     Status: None (Preliminary result)   Collection Time: 02/18/17  4:40 AM  Result Value Ref Range Status   Specimen Description BLOOD LEFT ARM  Final   Special Requests   Final    BOTTLES DRAWN AEROBIC AND ANAEROBIC Blood Culture adequate volume  Culture NO GROWTH 3 DAYS  Final   Report Status PENDING  Incomplete  Blood culture (routine x 2)     Status: None (Preliminary result)   Collection Time: 02/18/17  5:09 AM  Result Value Ref Range Status   Specimen Description BLOOD RIGHT ARM  Final   Special Requests   Final    BOTTLES DRAWN AEROBIC ONLY Blood Culture adequate volume   Culture NO GROWTH 3 DAYS  Final   Report Status PENDING  Incomplete  Aerobic/Anaerobic Culture (surgical/deep wound)     Status: None (Preliminary result)   Collection Time: 02/18/17  4:13 PM  Result Value Ref Range Status   Specimen Description ABSCESS DRAIN  Final   Special Requests NONE  Final   Gram Stain   Final    ABUNDANT WBC PRESENT, PREDOMINANTLY PMN ABUNDANT GRAM POSITIVE COCCI IN CLUSTERS    Culture   Final    ABUNDANT METHICILLIN RESISTANT STAPHYLOCOCCUS AUREUS NO ANAEROBES ISOLATED; CULTURE IN PROGRESS FOR 5 DAYS    Report Status PENDING  Incomplete   Organism ID, Bacteria METHICILLIN RESISTANT STAPHYLOCOCCUS AUREUS  Final      Susceptibility   Methicillin resistant staphylococcus aureus - MIC*    CIPROFLOXACIN <=0.5 SENSITIVE Sensitive     ERYTHROMYCIN >=8 RESISTANT Resistant     GENTAMICIN <=0.5 SENSITIVE Sensitive     OXACILLIN >=4 RESISTANT Resistant     TETRACYCLINE <=1 SENSITIVE Sensitive     VANCOMYCIN 1 SENSITIVE Sensitive     TRIMETH/SULFA <=10 SENSITIVE Sensitive     CLINDAMYCIN <=0.25 SENSITIVE Sensitive     RIFAMPIN <=0.5 SENSITIVE  Sensitive     Inducible Clindamycin NEGATIVE Sensitive     * ABUNDANT METHICILLIN RESISTANT STAPHYLOCOCCUS AUREUS  Culture, blood (routine x 2)     Status: None (Preliminary result)   Collection Time: 02/20/17 12:19 PM  Result Value Ref Range Status   Specimen Description BLOOD A-LINE  Final   Special Requests IN PEDIATRIC BOTTLE Blood Culture adequate volume  Final   Culture NO GROWTH 1 DAY  Final   Report Status PENDING  Incomplete    Rexene AlbertsStephanie Nesta Kimple, MSN, NP-C [facility] Cell: [phone] Pager: [phone]  02/22/2017 10:59 AM

## 2017-02-22 NOTE — Progress Notes (Signed)
Referring Physician(s): Dr Pilar Grammes Danford  Supervising Physician: Irish LackYamagata, Glenn  [facility]  [facility]  Chief Complaint: Prostate abscess s/p drain placement 10/28  Subjective: No complaints related to drain.   Allergies: Patient has no known allergies.  Medications: Prior to Admission medications   Medication Sig Start Date End Date Taking? Authorizing Provider  acetaminophen (TYLENOL) 325 MG tablet Take 650 mg by mouth every 6 (six) hours as needed for mild pain.   Yes [provider]  ARIPiprazole (ABILIFY) 10 MG tablet Take 5 mg by mouth daily.   Yes [provider]  aspirin 81 MG chewable tablet Chew 1 tablet (81 mg total) by mouth daily. 09/21/16  Yes Graciella Freerillery, Michael Andrew, PA-C  atorvastatin (LIPITOR) 20 MG tablet Take 1 tablet (20 mg total) by mouth daily at 6 PM. 09/21/16  Yes Tillery, Mariam DollarMichael Andrew, PA-C  buPROPion (WELLBUTRIN XL) 150 MG 24 hr tablet Take 1 tablet (150 mg total) by mouth daily. 12/06/16  Yes Nedrud, Jeanella FlatteryMarybeth, MD  carvedilol (COREG) 3.125 MG tablet Take 1 tablet (3.125 mg total) by mouth 2 (two) times daily. 12/06/16 12/06/17 Yes Valentino NoseBoswell, Nathan, MD  digoxin (LANOXIN) 0.125 MG tablet Take 1 tablet (0.125 mg total) by mouth daily. 09/21/16  Yes Graciella Freerillery, Michael Andrew, PA-C  furosemide (LASIX) 40 MG tablet Take 40 mg by mouth 2 (two) times daily.   Yes [provider]  hydrALAZINE (APRESOLINE) 25 MG tablet Take 1 tablet (25 mg total) by mouth 3 (three) times daily. 09/21/16  Yes Graciella Freerillery, Michael Andrew, PA-C  insulin glargine (LANTUS) 100 UNIT/ML injection Inject 20 units into skin each night. Check BG daily. If CBG>300 can take 40 units. Patient taking differently: Inject 20-40 Units into the skin daily as needed (high blood sugar).  12/07/16  Yes NedrudJeanella Flattery, Marybeth, MD  isosorbide mononitrate (IMDUR) 30 MG 24 hr tablet Take 1 tablet (30 mg total) by mouth daily. 09/21/16  Yes Graciella Freerillery, Michael Andrew, PA-C  metFORMIN (GLUCOPHAGE)  1000 MG tablet Take 1 tablet (1,000 mg total) by mouth 2 (two) times daily with a meal. For high blood sugar control 08/13/16  Yes Maxie BarbBhandari, Dron Prasad, MD  potassium chloride SA (K-DUR,KLOR-CON) 20 MEQ tablet Take 2 tablets (40 mEq total) by mouth daily. 12/06/16  Yes NedrudJeanella Flattery, Marybeth, MD  rivaroxaban (XARELTO) 20 MG TABS tablet Take 1 tablet (20 mg total) by mouth daily with supper. 12/26/16  Yes Nedrud, Jeanella FlatteryMarybeth, MD  sacubitril-valsartan (ENTRESTO) 24-26 MG Take 1 tablet by mouth 2 (two) times daily. 10/19/16  Yes Laurey MoraleMcLean, Dalton S, MD  sertraline (ZOLOFT) 100 MG tablet Take 100 mg by mouth at bedtime.    Yes [provider]  traZODone (DESYREL) 100 MG tablet Take 1 tablet (100 mg total) by mouth at bedtime. For sleep 12/27/12  Yes Armandina StammerNwoko, Agnes I, NP     Vital Signs: BP 109/68 (BP Location: Right Arm)   Pulse 87   Temp 97.7 F (36.5 C) (Oral)   Resp 18   Ht 6' 2.5" (1.892 m)   Wt 156 lb 6.4 oz (70.9 kg)   SpO2 98%   BMI 19.81 kg/m   Physical Exam  Constitutional: He is oriented to person, place, and time.  Musculoskeletal: Normal range of motion.  Neurological: He is alert and oriented to person, place, and time.  Skin: Skin is warm and dry.  Drain in place.  Site clean and dry.  No output around tube.  Pus in bag.   Psychiatric: He has a  normal mood and affect. His behavior is normal.  Nursing note and vitals reviewed.   Imaging: Ct Pelvis W Contrast  Result Date: 02/20/2017 CLINICAL DATA:  Status post percutaneous drainage of prostate abscess. EXAM: CT PELVIS WITH CONTRAST TECHNIQUE: Multidetector CT imaging of the pelvis was performed using the standard protocol following the bolus administration of intravenous contrast. CONTRAST:  ISOVUE-300 IOPAMIDOL (ISOVUE-300) INJECTION 61% COMPARISON:  02/18/2017 FINDINGS: Urinary Tract: Foley catheter decompresses the urinary bladder. Despite the decompressed state, bladder wall appears circumferentially thickened. Gas in the  bladder lumen is compatible with the presence of the catheter. Bowel:  Unremarkable. Vascular/Lymphatic: Atherosclerotic calcification noted distal aorta and iliac arteries. No pelvic sidewall lymphadenopathy. Reproductive: Prostate gland is enlarged and ill-defined. Right parasacral percutaneous drainage catheter is noted with the distal loop formed in the right aspect of the prostate gland. The right-sided intra prostatic fluid collection is markedly decompressed in the interval. There is persistence of the rim enhancing fluid collection in the left prostate gland extending posteriorly towards the rectum. This component measures 5.3 x 3.1 cm today, similar to 5.0 x 3.5 cm on the study from 2 days ago. Other: Edema is noted in the soft tissues of the pelvic for and body wall. Musculoskeletal: Bone windows reveal no worrisome lytic or sclerotic osseous lesions. IMPRESSION: 1. Interval decompression of the right component of the complex prostatic abscess. The rim enhancing fluid collection on the left extending posteriorly towards the rectum appears similar in size to the study from 2 days ago suggesting that it may not be drained by the existing catheter. Electronically Signed   By: Kennith Center M.D.   On: 02/20/2017 16:14   Ct Image Guided Drainage By Percutaneous Catheter  Result Date: 02/18/2017 CLINICAL DATA:  Multiloculated prostatic abscess. Drainage requested. EXAM: CT GUIDED DRAINAGE OF PROSTATIC ABSCESS ANESTHESIA/SEDATION: Intravenous Fentanyl and Versed were administered as conscious sedation during continuous monitoring of the patient's level of consciousness and physiological / cardiorespiratory status by the radiology RN, with a total moderate sedation time of 18 minutes. PROCEDURE: The procedure, risks, benefits, and alternatives were explained to the patient. Questions regarding the procedure were encouraged and answered. The patient understands and consents to the procedure. Patient placed  prone. Select axial scans through the pelvis obtained. The prostatic collection was localized an appropriate skin entry site was determined and marked. The operative field was prepped with chlorhexidinein a sterile fashion, and a sterile drape was applied covering the operative field. A sterile gown and sterile gloves were used for the procedure. Local anesthesia was provided with 1% Lidocaine. Under CT fluoroscopic guidance, 18 gauge trocar needle advanced into the right posterolateral component of the collection. Purulent material could be aspirated. An Amplatz guidewire advanced easily within the collection, position confirmed on CT. Tract dilated to facilitate placement of a 10 French pigtail drain catheter, formed centrally within the right component of the collection. Catheter secured externally with 0 Prolene suture and StatLock, and placed to external gravity drain bag. A sample of the green peroneal aspirate was sent for Gram stain and culture. The patient tolerated the procedure well. COMPLICATIONS: None immediate FINDINGS: Multiloculated bilateral prostatic abscess was localized. Ten French pigtail drain catheter placed as above. Sample of the aspirate sent for Gram stain and culture. IMPRESSION: 1. Technically successful CT-guided prostatic abscess drain catheter placement. Electronically Signed   By: Corlis Leak M.D.   On: 02/18/2017 16:35    Labs:  CBC:  Recent Labs  02/19/17 0353 02/20/17 0551 02/21/17  0739 02/22/17 0719  WBC 10.9* 7.1 7.1 6.6  HGB 11.8* 11.9* 11.7* 12.2*  HCT 34.2* 35.0* 34.7* 35.7*  PLT 342 343 311 330    COAGS: No results for input(s): INR, APTT in the last 8760 hours.  BMP:  Recent Labs  02/19/17 0353 02/20/17 0551 02/21/17 0739 02/22/17 0719  NA 133* 133* 134* 133*  K 3.8 4.1 3.9 4.2  CL 104 107 109 108  CO2 19* 16* 18* 17*  GLUCOSE 71 82 66 195*  BUN 25* 26* 27* 24*  CALCIUM 7.6* 7.7* 7.9* 7.9*  CREATININE 1.39* 1.44* 1.22 1.12  GFRNONAA 53*  51* >60 >60  GFRAA >60 59* >60 >60    LIVER FUNCTION TESTS:  Recent Labs  12/05/16 0226 02/17/17 2148 02/19/17 0353 02/22/17 0719  BILITOT 1.5* 0.8 1.0 0.7  AST 33 21 19 44*  ALT 23 14* 13* 27  ALKPHOS 87 126 93 146*  PROT 6.4* 8.3* 6.3* 6.9  ALBUMIN 2.3* 2.2* 1.7* 1.8*    Assessment and Plan: Prostate abscess s/p drain placement 10/27 New imaging obtained 10/29 shows fluid collection similar in size and left peri-rectal fluid collection.  Discussed case with Dr. Fredia Sorrow.  Additional percutaneous intervention would be high risk for rectal injury.   Spoke with Dr. Butler Denmark.  Urology previously following.   Current drain to remain in place at this time.  Continue to monitor output.   Electronically Signed: Hoyt Koch, PA 02/22/2017, 2:21 PM   I spent a total of 15 Minutes at the the patient's bedside AND on the patient's hospital floor or unit, greater than 50% of which was counseling/coordinating care for prostate abscess

## 2017-02-22 NOTE — Progress Notes (Signed)
Pharmacy Antibiotic Note  [name] is a 61 y.o. male admitted on 02/17/2017 with prostatic abscess, now s/p drain placement 10/27.  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  On admit, SCr was elevated but has trended down to baseline at this time.  Plan: Continue Vancomycin 750mg  IV q12h Will order Vancomycin trough for 11/1.  Goal trough 15-20 mcg/ml.  Height: 6' 2.5" (189.2 cm) Weight: 156 lb 6.4 oz (70.9 kg) IBW/kg (Calculated) : 83.35  Temp (24hrs), Avg:97.8 F (36.6 C), Min:97.5 F (36.4 C), Max:98.1 F (36.7 C)   Recent Labs Lab 02/17/17 2148 02/19/17 0353 02/20/17 0551 02/21/17 0739 02/22/17 0719  WBC 9.4 10.9* 7.1 7.1 6.6  CREATININE 1.55* 1.39* 1.44* 1.22 1.12    Estimated Creatinine Clearance: 68.6 mL/min (by C-G formula based on SCr of 1.12 mg/dL).    No Known Allergies  Antimicrobials this admission: Metronidazole 10/27>>10/27 Ceftriaxone 10/27>> 10/29 Vancomycin 10/28>>  Dose adjustments this admission:   Microbiology results: 10/27 Abscess drain >> Abundant MRSA (S: Clinda, Bactrim, Vanc) 10/27 blood x 2 >> ngtd 10/29 blood x 2 >> ngtd  Thank you for allowing pharmacy to be a part of this patient's care.  [employer], Pharm.D., BCPS Clinical Pharmacist Pager: [phone] Clinical phone for 02/22/2017 from 8:30-4:00 is [phone]. After 4pm, please call Main Rx [phone]) for assistance. 02/22/2017 2:10 PM

## 2017-02-22 NOTE — Progress Notes (Signed)
Inpatient Diabetes Program Recommendations  AACE/ADA: New Consensus Statement on Inpatient Glycemic Control (2015)  Target Ranges:  Prepandial:   less than 140 mg/dL      Peak postprandial:   less than 180 mg/dL (1-2 hours)      Critically ill patients:  140 - 180 mg/dL   Results for [name], [name] (MRN [id number]) as of 02/22/2017 12:02  Ref. Range 02/20/2017 16:51 02/20/2017 23:03 02/21/2017 07:40 02/21/2017 10:14 02/21/2017 12:05 02/21/2017 16:06 02/21/2017 21:07 02/22/2017 08:19  Glucose-Capillary Latest Ref Range: 65 - 99 mg/dL 893 (H) 98 65 810 (H) 175 (H) 230 (H) 278 (H) 227 (H)   Review of Glycemic Control  Current orders for Inpatient glycemic control: Lantus 10 units daily, Novolog 0-9 units TID with meals  Inpatient Diabetes Program Recommendations: Correction (SSI): Please consider ordering Novolog 0-5 units QHS for bedtime correction scale. Insulin - Meal Coverage: Please consider ordering Novolog 3 units TID with meals for meal coverage if patient eats at least 50% of meals.  Thanks, Orlando Penner, RN, MSN, CDE Diabetes Coordinator Inpatient Diabetes Program [phone] (Team Pager from 8am to 5pm)

## 2017-02-22 NOTE — Progress Notes (Signed)
Pt.had episode of 12 beats of Vtach ;pt.is asymptomatic;VS taken;t=97.5;p=75;r=18;98% on RA.MD on call was made aware & wrote orders.

## 2017-02-22 NOTE — NC FL2 (Signed)
[place] MEDICAID FL2 LEVEL OF CARE SCREENING TOOL     IDENTIFICATION  Patient Name: [name] Birthdate: Oct 25, 1955 Sex: male Admission Date (Current Location): 02/17/2017  [facility] and [place] Number:  Producer, television/film/video and Address:  [facility] [place]. [facility], [address] [place], [place] [address]      Provider Number: [id number]  Attending Physician Name and Address:  Calvert Cantor, MD  Relative Name and Phone Number:  Lupita Leash sister, [phone]    Current Level of Care: Hospital Recommended Level of Care: Skilled Nursing Facility Prior Approval Number:    Date Approved/Denied:   [name] Number:    Discharge Plan: SNF    Current Diagnoses: Patient Active Problem List   Diagnosis Date Noted  . Prostate abscess 02/18/2017  . AKI (acute kidney injury) (HCC) 02/18/2017  . Myocardial ischemia   . Pulmonary embolus (HCC)   . Cocaine abuse (HCC)   . H/O noncompliance with medical treatment, presenting hazards to health 12/04/2016  . Chronic systolic CHF (congestive heart failure) (HCC) 12/04/2016  . Anxiety state   . Depression with anxiety 08/05/2016  . DNR (do not resuscitate) discussion   . Palliative care encounter   . Polysubstance abuse (HCC)   . Type 2 diabetes mellitus with complication (HCC)   . Hypertensive heart disease with chronic systolic congestive heart failure (HCC)   . 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 Hyponatremia 02/23/2014  . Alcohol abuse 12/21/2012  . Cocaine dependence (HCC) 12/21/2012  . Substance induced mood disorder (HCC) 12/21/2012  . Coronary artery disease 08/14/2012  . Cardiomyopathy, nonischemic (HCC) 08/14/2012    Orientation RESPIRATION BLADDER Height & Weight     Self, Time, Situation, Place  Normal Incontinent, Indwelling catheter Weight: 70.9 kg (156 lb 6.4 oz) Height:  6' 2.5" (189.2 cm)  BEHAVIORAL SYMPTOMS/MOOD NEUROLOGICAL BOWEL NUTRITION STATUS      Incontinent Diet (Please see DC Summary)   AMBULATORY STATUS COMMUNICATION OF NEEDS Skin   Limited Assist Verbally Other (Comment) (Closed system drain on right buttock)                       Personal Care Assistance Level of Assistance  Bathing, Feeding, Dressing Bathing Assistance: Limited assistance Feeding assistance: Limited assistance Dressing Assistance: Limited assistance     Functional Limitations Info             SPECIAL CARE FACTORS FREQUENCY  PT (By licensed PT)     PT Frequency: 5x/week              Contractures      Additional Factors Info  Code Status, Allergies, Psychotropic Code Status Info: Full Allergies Info: NKA Psychotropic Info: Wellbutrin;Trazadone; Zoloft         Current Medications (02/22/2017):  This is the current hospital active medication list Current Facility-Administered Medications  Medication Dose Route Frequency Provider Last Rate Last Dose  . acetaminophen (TYLENOL) tablet 650 mg  650 mg Oral Q6H PRN Pearson Grippe, MD   650 mg at 02/20/17 3419   Or  . acetaminophen (TYLENOL) suppository 650 mg  650 mg Rectal Q6H PRN Pearson Grippe, MD      . ARIPiprazole (ABILIFY) tablet 5 mg  5 mg Oral Daily Pearson Grippe, MD   5 mg at 02/21/17 1008  . aspirin chewable tablet 81 mg  81 mg Oral Daily Pearson Grippe, MD   81 mg at 02/21/17 1008  . atorvastatin (LIPITOR) tablet 20 mg  20  mg Oral q1800 Pearson GrippeKim, James, MD   20 mg at 02/21/17 1743  . buPROPion (WELLBUTRIN XL) 24 hr tablet 150 mg  150 mg Oral Daily Pearson GrippeKim, James, MD   150 mg at 02/21/17 1008  . Chlorhexidine Gluconate Cloth 2 % PADS 6 each  6 each Topical Q0600 Alberteen Samanford, Christopher P, MD   6 each at 02/22/17 0617  . digoxin (LANOXIN) tablet 0.125 mg  0.125 mg Oral Daily Pearson GrippeKim, James, MD   0.125 mg at 02/21/17 1008  . feeding supplement (GLUCERNA SHAKE) (GLUCERNA SHAKE) liquid 237 mL  237 mL Oral TID BM Alberteen Samanford, Christopher P, MD   237 mL at 02/21/17 2004  . insulin aspart (novoLOG) injection 0-9 Units  0-9 Units Subcutaneous TID WC Pearson GrippeKim, James,  MD   3 Units at 02/21/17 1742  . insulin glargine (LANTUS) injection 10 Units  10 Units Subcutaneous Daily Alberteen Samanford, Christopher P, MD   10 Units at 02/21/17 1015  . mupirocin ointment (BACTROBAN) 2 % 1 application  1 application Nasal BID Alberteen Samanford, Christopher P, MD   1 application at 02/21/17 2243  . ondansetron (ZOFRAN) injection 4 mg  4 mg Intravenous Q6H PRN Dow AdolphHall, Carole N, DO   4 mg at 02/20/17 0036  . oxyCODONE (Oxy IR/ROXICODONE) immediate release tablet 5 mg  5 mg Oral Q6H PRN Alberteen Samanford, Christopher P, MD   5 mg at 02/21/17 1955  . phenazopyridine (PYRIDIUM) tablet 200 mg  200 mg Oral TID WC Rene PaciWinter, Christopher Aaron, MD   200 mg at 02/21/17 1743  . rivaroxaban (XARELTO) tablet 20 mg  20 mg Oral Q supper Alberteen Samanford, Christopher P, MD   20 mg at 02/21/17 1743  . sertraline (ZOLOFT) tablet 100 mg  100 mg Oral Farrel DemarkQHS Kim, James, MD   100 mg at 02/21/17 2244  . sodium chloride flush (NS) 0.9 % injection 5 mL  5 mL Intravenous Q8H Oley BalmHassell, Daniel, MD   5 mL at 02/22/17 0617  . traZODone (DESYREL) tablet 100 mg  100 mg Oral Farrel DemarkQHS Kim, James, MD   100 mg at 02/21/17 2242  . vancomycin (VANCOCIN) IVPB 750 mg/150 ml premix  750 mg Intravenous Q12H Alberteen Samanford, Christopher P, MD   Stopped at 02/21/17 2346     Discharge Medications: Please see discharge summary for a list of discharge medications.  Relevant Imaging Results:  Relevant Lab Results:   Additional Information SS#: [id number]  Mearl Latinadia S Jammal Sarr, LCSWA

## 2017-02-22 NOTE — Progress Notes (Signed)
PROGRESS NOTE    [name]   ZOX:[id number]  DOB: 1956-01-23  DOA: 02/17/2017 PCP: [facility]   Brief Summary: 61 yo M with CHF EF 10-15%, HTN, IDDM who presents with few days dysuria, found to have sepsis from prostatitis, prostatic abscess.  Subjective: No pain, nausea, vomiting, constipation. Having some episodes of loose stools and had 1 today.   Hospital Course:  MRSA Prostate abscess - has drain being managed by IR - 10/27- CT pelvis shows 5cm abscess in left lobe of prostate -  Culture growing MRSA,   -Continue vancomyin - ID consulted  - Urology recommends 3 wks of antibiotics, cont foley 7-10 days and f/u in office for voiding trial  Thrush - start Nystatin  Mild AKI with acidosis - Cr Improved with fluids, still mildly acidotic  -  Furosemide and Entresto  - Strict I/Os -  daily weights - 72 kg >> 70.9   Hyponatremia Improved to baseline  Chronic systolic CHF- V tach - EF 10-15%, not candidate for Adv therapies per HF clinic notes. -Hold furosemide and Entresto for now given soft BP, AKI -Continue aspirin, statin, dig - Coreg has been on hold- will resume today  Hypotensive with h/o HTN - required IV boluses -Holding Coreg, Entresto, Imdur, hydralazine, diuretics -  Resuming Coreg  Diabetes - Glargine is at 10 u rather than 20 U that he takes at home- AM sugar was 65 on 10/30-  sugars high- increase Lantus to 16 U -SSI with meals  Recent PE -Continue Xarelto- also on Aspirin  Other medications -Continue Abilify -Continue Wllbutrin, trazodone, sertraline  Alcohol use - no signs of withdrawl    DVT prophylaxis: Xarelto Code Status: Full code Family Communication:  Disposition Plan:  Consultants:   Urology  IR Procedures:   IR guided drain 02/19/17  Antimicrobials:  Anti-infectives    Start     Dose/Rate Route Frequency Ordered Stop   02/19/17 2300  vancomycin (VANCOCIN) IVPB 750 mg/150 ml premix     750 mg 150 mL/hr over 60 Minutes Intravenous Every 12 hours 02/19/17 1041     02/19/17 1100  vancomycin (VANCOCIN) 1,250 mg in sodium chloride 0.9 % 250 mL IVPB     1,250 mg 166.7 mL/hr over 90 Minutes Intravenous  Once 02/19/17 1041 02/19/17 1343   02/18/17 1400  metroNIDAZOLE (FLAGYL) IVPB 500 mg  Status:  Discontinued     500 mg 100 mL/hr over 60 Minutes Intravenous Every 8 hours 02/18/17 0623 02/18/17 1244   02/18/17 0700  cefTRIAXone (ROCEPHIN) 2 g in dextrose 5 % 50 mL IVPB  Status:  Discontinued     2 g 100 mL/hr over 30 Minutes Intravenous Every 24 hours 02/18/17 0648 02/20/17 1109   02/18/17 0530  metroNIDAZOLE (FLAGYL) IVPB 500 mg     500 mg 100 mL/hr over 60 Minutes Intravenous  Once 02/18/17 0520 02/18/17 0834   02/18/17 0500  vancomycin (VANCOCIN) IVPB 1000 mg/200 mL premix     1,000 mg 200 mL/hr over 60 Minutes Intravenous  Once 02/18/17 0448 02/18/17 0648   02/18/17 0500  piperacillin-tazobactam (ZOSYN) IVPB 3.375 g     3.375 g 100 mL/hr over 30 Minutes Intravenous  Once 02/18/17 0448 02/18/17 0616       Objective: Vitals:   02/21/17 2109 02/22/17 0500 02/22/17 0509 02/22/17 0949  BP: 118/85  109/68   Pulse: 75  79 87  Resp: 18  18   Temp: (!) 97.5 F (36.4 C)  97.7  F (36.5 C)   TempSrc: Oral  Oral   SpO2: 98%  98%   Weight:  70.9 kg (156 lb 6.4 oz)    Height:        Intake/Output Summary (Last 24 hours) at 02/22/17 1503 Last data filed at 02/22/17 0635  Gross per 24 hour  Intake              370 ml  Output              750 ml  Net             -380 ml   Filed Weights   02/19/17 0602 02/21/17 0500 02/22/17 0500  Weight: 70.1 kg (154 lb 8 oz) 71 kg (156 lb 8.4 oz) 70.9 kg (156 lb 6.4 oz)    Examination: General exam: Appears comfortable  HEENT: PERRLA, oral mucosa moist, no sclera icterus or thrush Respiratory system: Clear to auscultation. Respiratory effort normal. Cardiovascular system: S1 & S2 heard, RRR.  No murmurs  Gastrointestinal system:  Abdomen soft, non-tender, nondistended. Normal bowel sound. No organomegaly Central nervous system: Alert and oriented. No focal neurological deficits. Extremities: No cyanosis, clubbing or edema Skin: No rashes or ulcers Psychiatry:  Mood & affect appropriate.     Data Reviewed: I have personally reviewed following labs and imaging studies  CBC:  Recent Labs Lab 02/17/17 2148 02/19/17 0353 02/20/17 0551 02/21/17 0739 02/22/17 0719  WBC 9.4 10.9* 7.1 7.1 6.6  HGB 13.2 11.8* 11.9* 11.7* 12.2*  HCT 37.4* 34.2* 35.0* 34.7* 35.7*  MCV 83.5 83.6 84.7 84.4 84.2  PLT 398 342 343 311 330   Basic Metabolic Panel:  Recent Labs Lab 02/17/17 2148 02/19/17 0353 02/20/17 0551 02/21/17 0739 02/22/17 0719  NA 128* 133* 133* 134* 133*  K 4.0 3.8 4.1 3.9 4.2  CL 97* 104 107 109 108  CO2 17* 19* 16* 18* 17*  GLUCOSE 435* 71 82 66 195*  BUN 22* 25* 26* 27* 24*  CREATININE 1.55* 1.39* 1.44* 1.22 1.12  CALCIUM 8.4* 7.6* 7.7* 7.9* 7.9*  MG  --   --   --   --  1.6*   GFR: Estimated Creatinine Clearance: 68.6 mL/min (by C-G formula based on SCr of 1.12 mg/dL). Liver Function Tests:  Recent Labs Lab 02/17/17 2148 02/19/17 0353 02/22/17 0719  AST 21 19 44*  ALT 14* 13* 27  ALKPHOS 126 93 146*  BILITOT 0.8 1.0 0.7  PROT 8.3* 6.3* 6.9  ALBUMIN 2.2* 1.7* 1.8*    Recent Labs Lab 02/17/17 2148  LIPASE 92*   No results for input(s): AMMONIA in the last 168 hours. Coagulation Profile: No results for input(s): INR, PROTIME in the last 168 hours. Cardiac Enzymes: No results for input(s): CKTOTAL, CKMB, CKMBINDEX, TROPONINI in the last 168 hours. BNP (last 3 results) No results for input(s): PROBNP in the last 8760 hours. HbA1C: No results for input(s): HGBA1C in the last 72 hours. CBG:  Recent Labs Lab 02/21/17 1205 02/21/17 1606 02/21/17 2107 02/22/17 0819 02/22/17 1252  GLUCAP 160* 230* 278* 227* 257*   Lipid Profile: No results for input(s): CHOL, HDL, LDLCALC,  TRIG, CHOLHDL, LDLDIRECT in the last 72 hours. Thyroid Function Tests: No results for input(s): TSH, T4TOTAL, FREET4, T3FREE, THYROIDAB in the last 72 hours. Anemia Panel: No results for input(s): VITAMINB12, FOLATE, FERRITIN, TIBC, IRON, RETICCTPCT in the last 72 hours. Urine analysis:    Component Value Date/Time   COLORURINE YELLOW 02/18/2017 0311   APPEARANCEUR TURBID (  A) 02/18/2017 0311   LABSPEC 1.017 02/18/2017 0311   PHURINE 6.0 02/18/2017 0311   GLUCOSEU >=500 (A) 02/18/2017 0311   HGBUR MODERATE (A) 02/18/2017 0311   BILIRUBINUR NEGATIVE 02/18/2017 0311   KETONESUR NEGATIVE 02/18/2017 0311   PROTEINUR >=300 (A) 02/18/2017 0311   UROBILINOGEN 2.0 (H) 02/20/2014 1339   NITRITE NEGATIVE 02/18/2017 0311   LEUKOCYTESUR LARGE (A) 02/18/2017 0311   Sepsis Labs: @LABRCNTIP (procalcitonin:4,lacticidven:4) ) Recent Results (from the past 240 hour(s))  Blood culture (routine x 2)     Status: None (Preliminary result)   Collection Time: 02/18/17  4:40 AM  Result Value Ref Range Status   Specimen Description BLOOD LEFT ARM  Final   Special Requests   Final    BOTTLES DRAWN AEROBIC AND ANAEROBIC Blood Culture adequate volume   Culture NO GROWTH 4 DAYS  Final   Report Status PENDING  Incomplete  Blood culture (routine x 2)     Status: None (Preliminary result)   Collection Time: 02/18/17  5:09 AM  Result Value Ref Range Status   Specimen Description BLOOD RIGHT ARM  Final   Special Requests   Final    BOTTLES DRAWN AEROBIC ONLY Blood Culture adequate volume   Culture NO GROWTH 4 DAYS  Final   Report Status PENDING  Incomplete  Aerobic/Anaerobic Culture (surgical/deep wound)     Status: None (Preliminary result)   Collection Time: 02/18/17  4:13 PM  Result Value Ref Range Status   Specimen Description ABSCESS DRAIN  Final   Special Requests NONE  Final   Gram Stain   Final    ABUNDANT WBC PRESENT, PREDOMINANTLY PMN ABUNDANT GRAM POSITIVE COCCI IN CLUSTERS    Culture   Final     ABUNDANT METHICILLIN RESISTANT STAPHYLOCOCCUS AUREUS NO ANAEROBES ISOLATED; CULTURE IN PROGRESS FOR 5 DAYS    Report Status PENDING  Incomplete   Organism ID, Bacteria METHICILLIN RESISTANT STAPHYLOCOCCUS AUREUS  Final      Susceptibility   Methicillin resistant staphylococcus aureus - MIC*    CIPROFLOXACIN <=0.5 SENSITIVE Sensitive     ERYTHROMYCIN >=8 RESISTANT Resistant     GENTAMICIN <=0.5 SENSITIVE Sensitive     OXACILLIN >=4 RESISTANT Resistant     TETRACYCLINE <=1 SENSITIVE Sensitive     VANCOMYCIN 1 SENSITIVE Sensitive     TRIMETH/SULFA <=10 SENSITIVE Sensitive     CLINDAMYCIN <=0.25 SENSITIVE Sensitive     RIFAMPIN <=0.5 SENSITIVE Sensitive     Inducible Clindamycin NEGATIVE Sensitive     * ABUNDANT METHICILLIN RESISTANT STAPHYLOCOCCUS AUREUS  Culture, blood (routine x 2)     Status: None (Preliminary result)   Collection Time: 02/20/17 12:19 PM  Result Value Ref Range Status   Specimen Description BLOOD A-LINE  Final   Special Requests IN PEDIATRIC BOTTLE Blood Culture adequate volume  Final   Culture NO GROWTH 2 DAYS  Final   Report Status PENDING  Incomplete         Radiology Studies: Ct Pelvis W Contrast  Result Date: 02/20/2017 CLINICAL DATA:  Status post percutaneous drainage of prostate abscess. EXAM: CT PELVIS WITH CONTRAST TECHNIQUE: Multidetector CT imaging of the pelvis was performed using the standard protocol following the bolus administration of intravenous contrast. CONTRAST:  100mL ISOVUE-300 IOPAMIDOL (ISOVUE-300) INJECTION 61% COMPARISON:  02/18/2017 FINDINGS: Urinary Tract: Foley catheter decompresses the urinary bladder. Despite the decompressed state, bladder wall appears circumferentially thickened. Gas in the bladder lumen is compatible with the presence of the catheter. Bowel:  Unremarkable. Vascular/Lymphatic: Atherosclerotic calcification noted  distal aorta and iliac arteries. No pelvic sidewall lymphadenopathy. Reproductive: Prostate gland is  enlarged and ill-defined. Right parasacral percutaneous drainage catheter is noted with the distal loop formed in the right aspect of the prostate gland. The right-sided intra prostatic fluid collection is markedly decompressed in the interval. There is persistence of the rim enhancing fluid collection in the left prostate gland extending posteriorly towards the rectum. This component measures 5.3 x 3.1 cm today, similar to 5.0 x 3.5 cm on the study from 2 days ago. Other: Edema is noted in the soft tissues of the pelvic for and body wall. Musculoskeletal: Bone windows reveal no worrisome lytic or sclerotic osseous lesions. IMPRESSION: 1. Interval decompression of the right component of the complex prostatic abscess. The rim enhancing fluid collection on the left extending posteriorly towards the rectum appears similar in size to the study from 2 days ago suggesting that it may not be drained by the existing catheter. Electronically Signed   By: Kennith Center M.D.   On: 02/20/2017 16:14      Scheduled Meds: . ARIPiprazole  5 mg Oral Daily  . aspirin  81 mg Oral Daily  . atorvastatin  20 mg Oral q1800  . buPROPion  150 mg Oral Daily  . carvedilol  3.125 mg Oral BID  . Chlorhexidine Gluconate Cloth  6 each Topical Q0600  . digoxin  0.125 mg Oral Daily  . feeding supplement (GLUCERNA SHAKE)  237 mL Oral TID BM  . insulin aspart  0-9 Units Subcutaneous TID WC  . [START ON 02/23/2017] insulin glargine  16 Units Subcutaneous Daily  . mupirocin ointment  1 application Nasal BID  . nystatin  5 mL Oral QID  . phenazopyridine  200 mg Oral TID WC  . rivaroxaban  20 mg Oral Q supper  . sertraline  100 mg Oral QHS  . sodium chloride flush  5 mL Intravenous Q8H  . traZODone  100 mg Oral QHS   Continuous Infusions: . vancomycin Stopped (02/22/17 1240)     LOS: 4 days    Time spent in minutes: 35    Calvert Cantor, MD [facility] Pager: [email or web address] Password [email or web address] 02/22/2017, 3:03 PM

## 2017-02-23 ENCOUNTER — Encounter (HOSPITAL_COMMUNITY): Payer: Self-pay | Admitting: Physician Assistant

## 2017-02-23 DIAGNOSIS — Z9689 Presence of other specified functional implants: Secondary | ICD-10-CM

## 2017-02-23 DIAGNOSIS — B182 Chronic viral hepatitis C: Secondary | ICD-10-CM

## 2017-02-23 DIAGNOSIS — F1410 Cocaine abuse, uncomplicated: Secondary | ICD-10-CM

## 2017-02-23 DIAGNOSIS — I429 Cardiomyopathy, unspecified: Secondary | ICD-10-CM

## 2017-02-23 DIAGNOSIS — B9562 Methicillin resistant Staphylococcus aureus infection as the cause of diseases classified elsewhere: Secondary | ICD-10-CM

## 2017-02-23 LAB — BLOOD GAS, ARTERIAL
ACID-BASE DEFICIT: 6 mmol/L — AB (ref 0.0–2.0)
BICARBONATE: 17.4 mmol/L — AB (ref 20.0–28.0)
Drawn by: 274071
FIO2: 21
O2 Saturation: 94.9 %
PATIENT TEMPERATURE: 98.6
PO2 ART: 78.8 mmHg — AB (ref 83.0–108.0)
pCO2 arterial: 26.1 mmHg — ABNORMAL LOW (ref 32.0–48.0)
pH, Arterial: 7.44 (ref 7.350–7.450)

## 2017-02-23 LAB — HIV ANTIBODY (ROUTINE TESTING W REFLEX): HIV SCREEN 4TH GENERATION: NONREACTIVE

## 2017-02-23 LAB — GLUCOSE, CAPILLARY
GLUCOSE-CAPILLARY: 119 mg/dL — AB (ref 65–99)
GLUCOSE-CAPILLARY: 129 mg/dL — AB (ref 65–99)
GLUCOSE-CAPILLARY: 147 mg/dL — AB (ref 65–99)
Glucose-Capillary: 59 mg/dL — ABNORMAL LOW (ref 65–99)
Glucose-Capillary: 81 mg/dL (ref 65–99)
Glucose-Capillary: 66 mg/dL (ref 65–99)

## 2017-02-23 LAB — VANCOMYCIN, TROUGH: VANCOMYCIN TR: 28 ug/mL — AB (ref 15–20)

## 2017-02-23 LAB — AEROBIC/ANAEROBIC CULTURE W GRAM STAIN (SURGICAL/DEEP WOUND)

## 2017-02-23 LAB — AEROBIC/ANAEROBIC CULTURE (SURGICAL/DEEP WOUND)

## 2017-02-23 LAB — BASIC METABOLIC PANEL
ANION GAP: 9 (ref 5–15)
BUN: 22 mg/dL — AB (ref 6–20)
CHLORIDE: 109 mmol/L (ref 101–111)
CO2: 15 mmol/L — ABNORMAL LOW (ref 22–32)
Calcium: 7.9 mg/dL — ABNORMAL LOW (ref 8.9–10.3)
Creatinine, Ser: 1 mg/dL (ref 0.61–1.24)
GFR calc Af Amer: >60 mL/min (ref >60)
Glucose, Bld: 113 mg/dL — ABNORMAL HIGH (ref 65–99)
POTASSIUM: 4.5 mmol/L (ref 3.5–5.1)
SODIUM: 133 mmol/L — AB (ref 135–145)

## 2017-02-23 LAB — CULTURE, BLOOD (ROUTINE X 2)
Culture: NO GROWTH
Culture: NO GROWTH
SPECIAL REQUESTS: ADEQUATE
Special Requests: ADEQUATE

## 2017-02-23 LAB — HCV RNA QUANT
HCV Quantitative Log: 6.848 log10 IU/mL (ref >1.70)
HCV Quantitative: 7040000 IU/mL (ref >50)

## 2017-02-23 MED ORDER — RIVAROXABAN 20 MG PO TABS
20.0000 mg | ORAL_TABLET | Freq: Every day | ORAL | Status: DC
  Administered 2017-02-24 – 2017-02-27 (×4): 20 mg via ORAL
  Filled 2017-02-23 (×4): qty 1

## 2017-02-23 MED ORDER — VANCOMYCIN HCL IN DEXTROSE 1-5 GM/200ML-% IV SOLN
1000.0000 mg | INTRAVENOUS | Status: DC
  Administered 2017-02-23 – 2017-02-26 (×4): 1000 mg via INTRAVENOUS
  Filled 2017-02-23 (×5): qty 200

## 2017-02-23 MED ORDER — RIVAROXABAN 20 MG PO TABS: 20.0000 mg | ORAL_TABLET | Freq: Every day | ORAL | Status: DC

## 2017-02-23 NOTE — Progress Notes (Signed)
Pharmacy Antibiotic Note  [name] is a 61 y.o. male admitted on 02/17/2017 with prostatic abscess, now s/p drain placement 10/27.  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  On admit, SCr was elevated but has trended down to baseline at this time.  Vancomycin trough level today was 28 (goal 15-20).  Spoke with RN who confirmed dose had not been given yet.  Plan: Change Vancomycin to 1gm IV q24h - next dose at 2200 tonight. Goal trough 15-20 mcg/ml.  Height: 6' 2.5" (189.2 cm) Weight: 159 lb 12.8 oz (72.5 kg) IBW/kg (Calculated) : 83.35  Temp (24hrs), Avg:97.7 F (36.5 C), Min:97.5 F (36.4 C), Max:98 F (36.7 C)   Recent Labs Lab 02/17/17 2148 02/19/17 0353 02/20/17 0551 02/21/17 0739 02/22/17 0719 02/23/17 0607 02/23/17 1150  WBC 9.4 10.9* 7.1 7.1 6.6  --   --   CREATININE 1.55* 1.39* 1.44* 1.22 1.12 1.00  --   VANCOTROUGH  --   --   --   --   --   --  28*    Estimated Creatinine Clearance: 78.5 mL/min (by C-G formula based on SCr of 1 mg/dL).    No Known Allergies  Antimicrobials this admission: Metronidazole 10/27>>10/27 Ceftriaxone 10/27>> 10/29 Vancomycin 10/28>>  Dose adjustments this admission: 11/1 VT 28 (drawn ~ 1 hr late) change to 1gm q24h (est tr 15)  Microbiology results: 10/27 Abscess drain >> Abundant MRSA (S: Clinda, Bactrim, Vanc) 10/27 blood x 2 >> ngtd 10/29 blood x 2 >> ngtd  Thank you for allowing pharmacy to be a part of this patient's care.  [employer], Pharm.D., BCPS Clinical Pharmacist Pager: [phone] Clinical phone for 02/23/2017 from 8:30-4:00 is [phone]. After 4pm, please call Main Rx [phone]) for assistance. 02/23/2017 1:15 PM

## 2017-02-23 NOTE — Progress Notes (Signed)
Referring Physician(s): Rhoderick Moody  Supervising Physician: Jolaine Click  Patient Status:  [facility] - In-pt  Chief Complaint:  Prostate abscess  HPI:  Patient underwent CT-guided drain placement for prostate abscess on February 18, 2017 by Dr. Deanne Coffer.  Repeat CT scan done February 20, 2017 shows rim enhancing fluid collection on the left extending posteriorly toward the rectum which is similar in size from the study 2 days ago.  It does not appear to be drained by the existing catheter.  We are asked to evaluate patient for CT-guided aspiration versus another drain placement.  Allergies: Patient has no known allergies.  Medications: Prior to Admission medications   Medication Sig Start Date End Date Taking? Authorizing Provider  acetaminophen (TYLENOL) 325 MG tablet Take 650 mg by mouth every 6 (six) hours as needed for mild pain.   Yes [provider]  ARIPiprazole (ABILIFY) 10 MG tablet Take 5 mg by mouth daily.   Yes [provider]  aspirin 81 MG chewable tablet Chew 1 tablet (81 mg total) by mouth daily. 09/21/16  Yes Graciella Freer, PA-C  atorvastatin (LIPITOR) 20 MG tablet Take 1 tablet (20 mg total) by mouth daily at 6 PM. 09/21/16  Yes Tillery, Mariam Dollar, PA-C  buPROPion (WELLBUTRIN XL) 150 MG 24 hr tablet Take 1 tablet (150 mg total) by mouth daily. 12/06/16  Yes Nedrud, Jeanella Flattery, MD  carvedilol (COREG) 3.125 MG tablet Take 1 tablet (3.125 mg total) by mouth 2 (two) times daily. 12/06/16 12/06/17 Yes Valentino Nose, MD  digoxin (LANOXIN) 0.125 MG tablet Take 1 tablet (0.125 mg total) by mouth daily. 09/21/16  Yes Graciella Freer, PA-C  furosemide (LASIX) 40 MG tablet Take 40 mg by mouth 2 (two) times daily.   Yes [provider]  hydrALAZINE (APRESOLINE) 25 MG tablet Take 1 tablet (25 mg total) by mouth 3 (three) times daily. 09/21/16  Yes Graciella Freer, PA-C  insulin glargine (LANTUS) 100 UNIT/ML injection Inject  20 units into skin each night. Check BG daily. If CBG>300 can take 40 units. Patient taking differently: Inject 20-40 Units into the skin daily as needed (high blood sugar).  12/07/16  Yes NedrudJeanella Flattery, MD  isosorbide mononitrate (IMDUR) 30 MG 24 hr tablet Take 1 tablet (30 mg total) by mouth daily. 09/21/16  Yes Graciella Freer, PA-C  metFORMIN (GLUCOPHAGE) 1000 MG tablet Take 1 tablet (1,000 mg total) by mouth 2 (two) times daily with a meal. For high blood sugar control 08/13/16  Yes Maxie Barb, MD  potassium chloride SA (K-DUR,KLOR-CON) 20 MEQ tablet Take 2 tablets (40 mEq total) by mouth daily. 12/06/16  Yes NedrudJeanella Flattery, MD  rivaroxaban (XARELTO) 20 MG TABS tablet Take 1 tablet (20 mg total) by mouth daily with supper. 12/26/16  Yes Nedrud, Jeanella Flattery, MD  sacubitril-valsartan (ENTRESTO) 24-26 MG Take 1 tablet by mouth 2 (two) times daily. 10/19/16  Yes Laurey Morale, MD  sertraline (ZOLOFT) 100 MG tablet Take 100 mg by mouth at bedtime.    Yes [provider]  traZODone (DESYREL) 100 MG tablet Take 1 tablet (100 mg total) by mouth at bedtime. For sleep 12/27/12  Yes Armandina Stammer I, NP     Vital Signs: BP 128/85   Pulse 70   Temp 98 F (36.7 C) (Oral)   Resp 17   Ht 6' 2.5" (1.892 m)   Wt 159 lb 12.8 oz (72.5 kg)   SpO2 100%   BMI 20.24 kg/m   Physical  Exam Awake and alert Existing drain in place Tan purulent drainage in gravity bag. ~40 mL output Lungs Clear Heart RRR Abdomen soft, NTND  Imaging: Ct Pelvis W Contrast  Result Date: 02/20/2017 CLINICAL DATA:  Status post percutaneous drainage of prostate abscess. EXAM: CT PELVIS WITH CONTRAST TECHNIQUE: Multidetector CT imaging of the pelvis was performed using the standard protocol following the bolus administration of intravenous contrast. CONTRAST:  100mL ISOVUE-300 IOPAMIDOL (ISOVUE-300) INJECTION 61% COMPARISON:  02/18/2017 FINDINGS: Urinary Tract: Foley catheter decompresses the urinary  bladder. Despite the decompressed state, bladder wall appears circumferentially thickened. Gas in the bladder lumen is compatible with the presence of the catheter. Bowel:  Unremarkable. Vascular/Lymphatic: Atherosclerotic calcification noted distal aorta and iliac arteries. No pelvic sidewall lymphadenopathy. Reproductive: Prostate gland is enlarged and ill-defined. Right parasacral percutaneous drainage catheter is noted with the distal loop formed in the right aspect of the prostate gland. The right-sided intra prostatic fluid collection is markedly decompressed in the interval. There is persistence of the rim enhancing fluid collection in the left prostate gland extending posteriorly towards the rectum. This component measures 5.3 x 3.1 cm today, similar to 5.0 x 3.5 cm on the study from 2 days ago. Other: Edema is noted in the soft tissues of the pelvic for and body wall. Musculoskeletal: Bone windows reveal no worrisome lytic or sclerotic osseous lesions. IMPRESSION: 1. Interval decompression of the right component of the complex prostatic abscess. The rim enhancing fluid collection on the left extending posteriorly towards the rectum appears similar in size to the study from 2 days ago suggesting that it may not be drained by the existing catheter. Electronically Signed   By: Kennith CenterEric  Mansell M.D.   On: 02/20/2017 16:14    Labs:  CBC:  Recent Labs  02/19/17 0353 02/20/17 0551 02/21/17 0739 02/22/17 0719  WBC 10.9* 7.1 7.1 6.6  HGB 11.8* 11.9* 11.7* 12.2*  HCT 34.2* 35.0* 34.7* 35.7*  PLT 342 343 311 330    COAGS: No results for input(s): INR, APTT in the last 8760 hours.  BMP:  Recent Labs  02/20/17 0551 02/21/17 0739 02/22/17 0719 02/23/17 0607  NA 133* 134* 133* 133*  K 4.1 3.9 4.2 4.5  CL 107 109 108 109  CO2 16* 18* 17* 15*  GLUCOSE 82 66 195* 113*  BUN 26* 27* 24* 22*  CALCIUM 7.7* 7.9* 7.9* 7.9*  CREATININE 1.44* 1.22 1.12 1.00  GFRNONAA 51* >60 >60 >60  GFRAA 59* >60  >60 >60    LIVER FUNCTION TESTS:  Recent Labs  12/05/16 0226 02/17/17 2148 02/19/17 0353 02/22/17 0719  BILITOT 1.5* 0.8 1.0 0.7  AST 33 21 19 44*  ALT 23 14* 13* 27  ALKPHOS 87 126 93 146*  PROT 6.4* 8.3* 6.3* 6.9  ALBUMIN 2.3* 2.2* 1.7* 1.8*    Assessment and Plan:  Prostate abscess  Images were reviewed by Dr. Bonnielee HaffHoss.    Will proceed with image guided aspiration versus drain placement tomorrow.  Risks and benefits discussed with the patient including bleeding, infection, damage to adjacent structures, bowel perforation/fistula connection, and sepsis.  All of the patient's questions were answered, patient is agreeable to proceed. Consent signed and in chart.  Electronically Signed: Gwynneth MacleodWENDY S Shariq Puig, PA-C 02/23/2017, 1:25 PM   I spent a total of 15 Minutes at the the patient's bedside AND on the patient's hospital floor or unit, greater than 50% of which was counseling/coordinating care for f/u drain and new aspiration/ drain placement

## 2017-02-23 NOTE — Progress Notes (Signed)
Physical Therapy Treatment Patient Details Name: [name] CancerJerry W Corales MRN: [id number] DOB: Oct 15, 1955 Today's Date: 02/23/2017    History of Present Illness 61 y.o.male,w hypertension, dm2, cad, CHF 10-15%) apparently c/o burning w urination starting 2 days ago. Pt has had diarrhea for the past 4 days. Pt notes some generalized abdominal discomfort. Pt denies fever, chills, n/v, constipation, brbpr. Pt presented to ED due to diarrhea, and dysuria. + Prostate abscess s/p IR drain placement 10/27.    PT Comments    Pt is very limited today by dizziness and nausea. Pt reports that he is supposed to start physical therapy for vertigo tomorrow. Currently, no orders for vestibular evaluation. Will have vestibular PT work with pt tomorrow. Pt requires skilled PT to progress mobility and improve strength and endurance once dizziness and nausea has subsided to safely navigate their discharge environment.   Follow Up Recommendations  SNF;Supervision/Assistance - 24 hour     Equipment Recommendations  Rolling walker with 5" wheels    Recommendations for Other Services       Precautions / Restrictions Precautions Precautions: Fall Restrictions Weight Bearing Restrictions: No          Cognition Arousal/Alertness: Awake/alert Behavior During Therapy: Flat affect Overall Cognitive Status: No family/caregiver present to determine baseline cognitive functioning                                        Exercises General Exercises - Lower Extremity Ankle Circles/Pumps: AROM;Both;10 reps;Supine Quad Sets: AROM;Both;15 reps;Supine Gluteal Sets: AROM;Both;10 reps;Supine Short Arc Quad: AROM;Both;10 reps;Supine Heel Slides: AROM;Both;10 reps;Supine Hip ABduction/ADduction: AROM;Both;10 reps;Supine Straight Leg Raises: AROM;Both;10 reps;Supine    General Comments General comments (skin integrity, edema, etc.): Pt with complaints of dizziness and nausea at entry, Pt reported  that he was to start vertigo PT tomorrow but do not see orders at this time. Pt requested not moving today but agreed to bed exercises      Pertinent Vitals/Pain Pain Assessment: Faces Faces Pain Scale: Hurts little more Pain Location: groin and perianal region Pain Descriptors / Indicators: Discomfort;Grimacing;Guarding Pain Intervention(s): Limited activity within patient's tolerance;Monitored during session           PT Goals (current goals can now be found in the care plan section) Acute Rehab PT Goals Patient Stated Goal: to not be in pain PT Goal Formulation: With patient Time For Goal Achievement: 03/07/17 Potential to Achieve Goals: Fair Progress towards PT goals: Not progressing toward goals - comment (limited by dizziness and nausea)    Frequency    Min 2X/week      PT Plan Current plan remains appropriate       AM-PAC PT "6 Clicks" Daily Activity  Outcome Measure  Difficulty turning over in bed (including adjusting bedclothes, sheets and blankets)?: Unable Difficulty moving from lying on back to sitting on the side of the bed? : Unable Difficulty sitting down on and standing up from a chair with arms (e.g., wheelchair, bedside commode, etc,.)?: Unable Help needed moving to and from a bed to chair (including a wheelchair)?: A Little Help needed walking in hospital room?: A Little Help needed climbing 3-5 steps with a railing? : A Lot 6 Click Score: 11    End of Session   Activity Tolerance: Patient limited by pain;Other (comment) Patient left: in bed;with call bell/phone within reach;with bed alarm set Nurse Communication: Mobility status PT Visit Diagnosis: Unsteadiness on  feet (R26.81);Difficulty in walking, not elsewhere classified (R26.2);Pain Pain - part of body:  (stomach)     Time: 0737-1062 PT Time Calculation (min) (ACUTE ONLY): 15 min  Charges:  $Therapeutic Exercise: 8-22 mins                    G Codes:       Myking Sar B. Beverely Risen  PT, DPT Acute Rehabilitation  [phone] Pager [phone]  Elon Alas Fleet 02/23/2017, 2:33 PM

## 2017-02-23 NOTE — Clinical Social Work Note (Signed)
Clinical Social Work Assessment  Patient Details  Name: [name] MRN: [id number] Date of Birth: 16-Oct-1955  Date of referral:  02/23/17               Reason for consult:  Facility Placement                Permission sought to share information with:  Facility Medical sales representative, Family Supports Permission granted to share information::  Yes, Verbal Permission Granted  Name::     Production manager::  SNFs  Relationship::  Mother  Contact Information:  [phone]  Housing/Transportation Living arrangements for the past 2 months:  Apartment Source of Information:  Patient Patient Interpreter Needed:  None Criminal Activity/Legal Involvement Pertinent to Current Situation/Hospitalization:  No - Comment as needed Significant Relationships:  Siblings, Parents Lives with:  Self Do you feel safe going back to the place where you live?  No Need for family participation in patient care:  No (Coment)  Care giving concerns:  CSW received consult for possible SNF placement at time of discharge. CSW spoke with patient regarding PT recommendation of SNF placement at time of discharge. Patient expressed understanding of PT recommendation and is agreeable to SNF placement at time of discharge. CSW to continue to follow and assist with discharge planning needs.   Social Worker assessment / plan:  CSW spoke with patient concerning possibility of rehab at [facility] before returning home.  Employment status:  Unemployed Database administrator PT Recommendations:  Skilled Nursing Facility Information / Referral to community resources:  Skilled Nursing Facility  Patient/Family's Response to care:  Patient recognizes need for rehab before returning home and is agreeable to a SNF in [place]. Patient in agreement with [facility] (only [facility] offer) since he has been there before.   Patient/Family's Understanding of and Emotional Response to Diagnosis, Current Treatment,  and Prognosis:  Patient/family is realistic regarding therapy needs and expressed being hopeful for SNF placement. Patient expressed understanding of CSW role and discharge process as well as medical condition. No questions/concerns about plan or treatment.    Emotional Assessment Appearance:  Appears stated age Attitude/Demeanor/Rapport:  Other (Appropriate) Affect (typically observed):  Accepting, Flat Orientation:  Oriented to Place, Oriented to Situation, Oriented to Self, Oriented to  Time Alcohol / Substance use:  Not Applicable Psych involvement (Current and /or in the community):  No (Comment)  Discharge Needs  Concerns to be addressed:  Care Coordination Readmission within the last 30 days:  No Current discharge risk:  None Barriers to Discharge:  Continued Medical Work up   [employer], LCSWA 02/23/2017, 11:40 AM

## 2017-02-23 NOTE — Progress Notes (Signed)
PROGRESS NOTE    [name]   ZOX:[id number]  DOB: 04-Apr-1956  DOA: 02/17/2017 PCP: [facility]   Brief Summary: 61 yo M with CHF EF 10-15%, HTN, IDDM who presents with few days dysuria, found to have sepsis from prostatitis, prostatic abscess.  Subjective: Poor appetite. Does not like the [employer] Ensure. No further loose stool. No pain, nausea, vomiting, constipation.    Hospital Course:  MRSA Prostate abscess - has drain being managed by IR- will go for further drainage tomorrow - 10/27- CT pelvis shows 5cm abscess in left lobe of prostate -  Culture growing MRSA,   -Continue vancomyin - ID consulted  - Urology recommends 3 wks of antibiotics, cont foley 7-10 days and f/u in office for voiding trial  Thrush - 20/31- started Nystatin- has improved a little  Mild AKI with acidosis - Cr Improved with fluids which are now on hold, still mildly acidotic - check ABG -  Furosemide and Entresto  - Strict I/Os -  daily weights - 72 kg >> 70.9   Hyponatremia Improved to baseline  Chronic systolic CHF- V tach - EF 10-15%, not candidate for Adv therapies per HF clinic notes. -Hold furosemide and Entresto for now given soft BP, AKI -Continue aspirin, statin, dig - Coreg resumed    Hypotensive with h/o HTN - required IV boluses -Holding Coreg, Entresto, Imdur, hydralazine, diuretics - following weights -10/31- resumed Coreg  Diabetes - Glargine is at 10 u rather than 20 U that he takes at home- AM sugar was 65 on 10/30-  sugars high- increased Lantus to 16 U yesterday and sugars are improved today -SSI with meals  Recent PE -Continue Xarelto- also on Aspirin  Other medications -Continue Abilify -Continue Wllbutrin, trazodone, sertraline  Alcohol use - no signs of withdrawl    DVT prophylaxis: Xarelto Code Status: Full code Family Communication:  Disposition Plan:  Consultants:   Urology  IR Procedures:   IR guided drain  02/19/17  Antimicrobials:  Anti-infectives    Start     Dose/Rate Route Frequency Ordered Stop   02/23/17 2200  vancomycin (VANCOCIN) IVPB 1000 mg/200 mL premix     1,000 mg 200 mL/hr over 60 Minutes Intravenous Every 24 hours 02/23/17 1317     02/19/17 2300  vancomycin (VANCOCIN) IVPB 750 mg/150 ml premix  Status:  Discontinued     750 mg 150 mL/hr over 60 Minutes Intravenous Every 12 hours 02/19/17 1041 02/23/17 1317   02/19/17 1100  vancomycin (VANCOCIN) 1,250 mg in sodium chloride 0.9 % 250 mL IVPB     1,250 mg 166.7 mL/hr over 90 Minutes Intravenous  Once 02/19/17 1041 02/19/17 1343   02/18/17 1400  metroNIDAZOLE (FLAGYL) IVPB 500 mg  Status:  Discontinued     500 mg 100 mL/hr over 60 Minutes Intravenous Every 8 hours 02/18/17 0623 02/18/17 1244   02/18/17 0700  cefTRIAXone (ROCEPHIN) 2 g in dextrose 5 % 50 mL IVPB  Status:  Discontinued     2 g 100 mL/hr over 30 Minutes Intravenous Every 24 hours 02/18/17 0648 02/20/17 1109   02/18/17 0530  metroNIDAZOLE (FLAGYL) IVPB 500 mg     500 mg 100 mL/hr over 60 Minutes Intravenous  Once 02/18/17 0520 02/18/17 0834   02/18/17 0500  vancomycin (VANCOCIN) IVPB 1000 mg/200 mL premix     1,000 mg 200 mL/hr over 60 Minutes Intravenous  Once 02/18/17 0448 02/18/17 0648   02/18/17 0500  piperacillin-tazobactam (ZOSYN) IVPB 3.375 g  3.375 g 100 mL/hr over 30 Minutes Intravenous  Once 02/18/17 0448 02/18/17 0616       Objective: Vitals:   02/22/17 2157 02/23/17 0500 02/23/17 0627 02/23/17 0916  BP: 106/73  123/77 128/85  Pulse: 66  63 70  Resp: 17  17   Temp: (!) 97.5 F (36.4 C)  98 F (36.7 C)   TempSrc: Oral  Oral   SpO2: 100%  100%   Weight:  72.5 kg (159 lb 12.8 oz)    Height:        Intake/Output Summary (Last 24 hours) at 02/23/17 1408 Last data filed at 02/23/17 1341  Gross per 24 hour  Intake              275 ml  Output              637 ml  Net             -362 ml   Filed Weights   02/21/17 0500 02/22/17 0500  02/23/17 0500  Weight: 71 kg (156 lb 8.4 oz) 70.9 kg (156 lb 6.4 oz) 72.5 kg (159 lb 12.8 oz)    Examination: General exam: Appears comfortable  HEENT: PERRLA, oral mucosa moist, no sclera icterus or thrush Respiratory system: Clear to auscultation. Respiratory effort normal. Cardiovascular system: S1 & S2 heard, RRR.  No murmurs  Gastrointestinal system: Abdomen soft, non-tender, nondistended. Normal bowel sound. No organomegaly Central nervous system: Alert and oriented. No focal neurological deficits. Extremities: No cyanosis, clubbing or edema Skin: No rashes or ulcers Psychiatry:  Mood & affect appropriate.     Data Reviewed: I have personally reviewed following labs and imaging studies  CBC:  Recent Labs Lab 02/17/17 2148 02/19/17 0353 02/20/17 0551 02/21/17 0739 02/22/17 0719  WBC 9.4 10.9* 7.1 7.1 6.6  HGB 13.2 11.8* 11.9* 11.7* 12.2*  HCT 37.4* 34.2* 35.0* 34.7* 35.7*  MCV 83.5 83.6 84.7 84.4 84.2  PLT 398 342 343 311 330   Basic Metabolic Panel:  Recent Labs Lab 02/19/17 0353 02/20/17 0551 02/21/17 0739 02/22/17 0719 02/23/17 0607  NA 133* 133* 134* 133* 133*  K 3.8 4.1 3.9 4.2 4.5  CL 104 107 109 108 109  CO2 19* 16* 18* 17* 15*  GLUCOSE 71 82 66 195* 113*  BUN 25* 26* 27* 24* 22*  CREATININE 1.39* 1.44* 1.22 1.12 1.00  CALCIUM 7.6* 7.7* 7.9* 7.9* 7.9*  MG  --   --   --  1.6*  --    GFR: Estimated Creatinine Clearance: 78.5 mL/min (by C-G formula based on SCr of 1 mg/dL). Liver Function Tests:  Recent Labs Lab 02/17/17 2148 02/19/17 0353 02/22/17 0719  AST 21 19 44*  ALT 14* 13* 27  ALKPHOS 126 93 146*  BILITOT 0.8 1.0 0.7  PROT 8.3* 6.3* 6.9  ALBUMIN 2.2* 1.7* 1.8*    Recent Labs Lab 02/17/17 2148  LIPASE 92*   No results for input(s): AMMONIA in the last 168 hours. Coagulation Profile: No results for input(s): INR, PROTIME in the last 168 hours. Cardiac Enzymes: No results for input(s): CKTOTAL, CKMB, CKMBINDEX, TROPONINI in  the last 168 hours. BNP (last 3 results) No results for input(s): PROBNP in the last 8760 hours. HbA1C: No results for input(s): HGBA1C in the last 72 hours. CBG:  Recent Labs Lab 02/22/17 1252 02/22/17 1742 02/22/17 2159 02/23/17 0803 02/23/17 1204  GLUCAP 257* 85 183* 129* 147*   Lipid Profile: No results for input(s): CHOL, HDL, LDLCALC, TRIG,  CHOLHDL, LDLDIRECT in the last 72 hours. Thyroid Function Tests: No results for input(s): TSH, T4TOTAL, FREET4, T3FREE, THYROIDAB in the last 72 hours. Anemia Panel: No results for input(s): VITAMINB12, FOLATE, FERRITIN, TIBC, IRON, RETICCTPCT in the last 72 hours. Urine analysis:    Component Value Date/Time   COLORURINE YELLOW 02/18/2017 0311   APPEARANCEUR TURBID (A) 02/18/2017 0311   LABSPEC 1.017 02/18/2017 0311   PHURINE 6.0 02/18/2017 0311   GLUCOSEU >=500 (A) 02/18/2017 0311   HGBUR MODERATE (A) 02/18/2017 0311   BILIRUBINUR NEGATIVE 02/18/2017 0311   KETONESUR NEGATIVE 02/18/2017 0311   PROTEINUR >=300 (A) 02/18/2017 0311   UROBILINOGEN 2.0 (H) 02/20/2014 1339   NITRITE NEGATIVE 02/18/2017 0311   LEUKOCYTESUR LARGE (A) 02/18/2017 0311   Sepsis Labs: @LABRCNTIP (procalcitonin:4,lacticidven:4) ) Recent Results (from the past 240 hour(s))  Blood culture (routine x 2)     Status: None (Preliminary result)   Collection Time: 02/18/17  4:40 AM  Result Value Ref Range Status   Specimen Description BLOOD LEFT ARM  Final   Special Requests   Final    BOTTLES DRAWN AEROBIC AND ANAEROBIC Blood Culture adequate volume   Culture NO GROWTH 4 DAYS  Final   Report Status PENDING  Incomplete  Blood culture (routine x 2)     Status: None (Preliminary result)   Collection Time: 02/18/17  5:09 AM  Result Value Ref Range Status   Specimen Description BLOOD RIGHT ARM  Final   Special Requests   Final    BOTTLES DRAWN AEROBIC ONLY Blood Culture adequate volume   Culture NO GROWTH 4 DAYS  Final   Report Status PENDING  Incomplete    Aerobic/Anaerobic Culture (surgical/deep wound)     Status: None   Collection Time: 02/18/17  4:13 PM  Result Value Ref Range Status   Specimen Description ABSCESS DRAIN  Final   Special Requests NONE  Final   Gram Stain   Final    ABUNDANT WBC PRESENT, PREDOMINANTLY PMN ABUNDANT GRAM POSITIVE COCCI IN CLUSTERS    Culture   Final    ABUNDANT METHICILLIN RESISTANT STAPHYLOCOCCUS AUREUS NO ANAEROBES ISOLATED; CULTURE IN PROGRESS FOR 5 DAYS    Report Status 02/23/2017 FINAL  Final   Organism ID, Bacteria METHICILLIN RESISTANT STAPHYLOCOCCUS AUREUS  Final      Susceptibility   Methicillin resistant staphylococcus aureus - MIC*    CIPROFLOXACIN <=0.5 SENSITIVE Sensitive     ERYTHROMYCIN >=8 RESISTANT Resistant     GENTAMICIN <=0.5 SENSITIVE Sensitive     OXACILLIN >=4 RESISTANT Resistant     TETRACYCLINE <=1 SENSITIVE Sensitive     VANCOMYCIN 1 SENSITIVE Sensitive     TRIMETH/SULFA <=10 SENSITIVE Sensitive     CLINDAMYCIN <=0.25 SENSITIVE Sensitive     RIFAMPIN <=0.5 SENSITIVE Sensitive     Inducible Clindamycin NEGATIVE Sensitive     * ABUNDANT METHICILLIN RESISTANT STAPHYLOCOCCUS AUREUS  Culture, blood (routine x 2)     Status: None (Preliminary result)   Collection Time: 02/20/17 12:19 PM  Result Value Ref Range Status   Specimen Description BLOOD CENTRAL LINE  Final   Special Requests IN PEDIATRIC BOTTLE Blood Culture adequate volume  Final   Culture NO GROWTH 2 DAYS  Final   Report Status PENDING  Incomplete  Culture, blood (routine x 2)     Status: None (Preliminary result)   Collection Time: 02/20/17 12:19 PM  Result Value Ref Range Status   Specimen Description BLOOD A-LINE  Final   Special Requests IN PEDIATRIC BOTTLE  Blood Culture adequate volume  Final   Culture NO GROWTH 2 DAYS  Final   Report Status PENDING  Incomplete         Radiology Studies: No results found.    Scheduled Meds: . ARIPiprazole  5 mg Oral Daily  . aspirin  81 mg Oral Daily  .  atorvastatin  20 mg Oral q1800  . buPROPion  150 mg Oral Daily  . carvedilol  3.125 mg Oral BID  . Chlorhexidine Gluconate Cloth  6 each Topical Q0600  . digoxin  0.125 mg Oral Daily  . feeding supplement (GLUCERNA SHAKE)  237 mL Oral TID BM  . insulin aspart  0-9 Units Subcutaneous TID WC  . insulin glargine  16 Units Subcutaneous Daily  . mupirocin ointment  1 application Nasal BID  . nystatin  5 mL Oral QID  . [START ON 02/24/2017] rivaroxaban  20 mg Oral Q supper  . sertraline  100 mg Oral QHS  . sodium chloride flush  5 mL Intravenous Q8H  . traZODone  100 mg Oral QHS   Continuous Infusions: . vancomycin       LOS: 5 days    Time spent in minutes: 35    Calvert Cantor, MD [facility] Pager: [email or web address] Password [email or web address] 02/23/2017, 2:08 PM

## 2017-02-23 NOTE — Progress Notes (Signed)
[facility]  Date of Admission:  02/17/2017    Total days of antibiotics          6             Vancomycin 10/27 >>             Ceftriaxone 10/27 >>  10/28             Zosyn 10/27             Metronidazole 10/27  Patient ID: 61 yo AA male admitted for diarrhea and dysuria. Found to have MRSA prostate abscesses s/p IR perc drain placement 10/27.           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 ARIPiprazole  5 mg Oral Daily  . aspirin  81 mg Oral Daily  . atorvastatin  20 mg Oral q1800  . buPROPion  150 mg Oral Daily  . carvedilol  3.125 mg Oral BID  . Chlorhexidine Gluconate Cloth  6 each Topical Q0600  . digoxin  0.125 mg Oral Daily  . feeding supplement (GLUCERNA SHAKE)  237 mL Oral TID BM  . insulin aspart  0-9 Units Subcutaneous TID WC  . insulin glargine  16 Units Subcutaneous Daily  . mupirocin ointment  1 application Nasal BID  . nystatin  5 mL Oral QID  . [START ON 02/25/2017] rivaroxaban  20 mg Oral Q supper  . sertraline  100 mg Oral QHS  . sodium chloride flush  5 mL Intravenous Q8H  . traZODone  100 mg Oral QHS    SUBJECTIVE: Only complaint is worsening depression today. Has had these thoughts over the last several months. Not much support at home. On medication for this currently but does not feel it is working. Frequently drinks a lot of alcohol at home to self-medicate for this problem. Some abdominal pain but otherwise feeling OK. Anticipates going to SNF after this admission.   Review of Systems: Review of Systems  Constitutional: Negative for chills and fever.  HENT: Negative for tinnitus.   Eyes: Negative for blurred vision and  photophobia.  Respiratory: Negative for cough and sputum production.   Cardiovascular: Negative for chest pain.  Gastrointestinal: Positive for abdominal pain. Negative for diarrhea, nausea and vomiting.  Genitourinary: Negative for dysuria.  Skin: Negative for rash.  Neurological: Negative for headaches.  Psychiatric/Behavioral: Positive for depression and substance abuse.    No Known Allergies  OBJECTIVE: Vitals:   02/22/17 2157 02/23/17 0500 02/23/17 0627 02/23/17 0916  BP: 106/73  123/77 128/85  Pulse: 66  63 70  Resp: 17  17   Temp: (!) 97.5 F (36.4 C)  98 F (36.7 C)   TempSrc: Oral  Oral   SpO2: 100%  100%   Weight:  159 lb 12.8 oz (72.5 kg)    Height:       Body mass index is 20.24 kg/m.  Physical Exam  Constitutional: He is oriented to person, place, and time and well-developed, well-nourished, and in no distress.  HENT:  Mouth/Throat: No oral lesions. Normal dentition. No dental caries.  Eyes: No scleral icterus.  Cardiovascular: Normal rate, regular rhythm  and normal heart sounds.   Pulmonary/Chest: Effort normal and breath sounds normal.  Abdominal: Soft. He exhibits no distension. There is no tenderness.  Lymphadenopathy:    He has no cervical adenopathy.  Neurological: He is alert and oriented to person, place, and time.  Skin: Skin is warm and dry. No rash noted.  Psychiatric: Affect normal. He exhibits a depressed mood.    Lab Results Lab Results  Component Value Date   WBC 6.6 02/22/2017   HGB 12.2 (L) 02/22/2017   HCT 35.7 (L) 02/22/2017   MCV 84.2 02/22/2017   PLT 330 02/22/2017    Lab Results  Component Value Date   CREATININE 1.00 02/23/2017   BUN 22 (H) 02/23/2017   NA 133 (L) 02/23/2017   K 4.5 02/23/2017   CL 109 02/23/2017   CO2 15 (L) 02/23/2017    Lab Results  Component Value Date   ALT 27 02/22/2017   AST 44 (H) 02/22/2017   ALKPHOS 146 (H) 02/22/2017   BILITOT 0.7 02/22/2017     Microbiology: Recent Results (from the  past 240 hour(s))  Blood culture (routine x 2)     Status: None (Preliminary result)   Collection Time: 02/18/17  4:40 AM  Result Value Ref Range Status   Specimen Description BLOOD LEFT ARM  Final   Special Requests   Final    BOTTLES DRAWN AEROBIC AND ANAEROBIC Blood Culture adequate volume   Culture NO GROWTH 4 DAYS  Final   Report Status PENDING  Incomplete  Blood culture (routine x 2)     Status: None (Preliminary result)   Collection Time: 02/18/17  5:09 AM  Result Value Ref Range Status   Specimen Description BLOOD RIGHT ARM  Final   Special Requests   Final    BOTTLES DRAWN AEROBIC ONLY Blood Culture adequate volume   Culture NO GROWTH 4 DAYS  Final   Report Status PENDING  Incomplete  Aerobic/Anaerobic Culture (surgical/deep wound)     Status: None   Collection Time: 02/18/17  4:13 PM  Result Value Ref Range Status   Specimen Description ABSCESS DRAIN  Final   Special Requests NONE  Final   Gram Stain   Final    ABUNDANT WBC PRESENT, PREDOMINANTLY PMN ABUNDANT GRAM POSITIVE COCCI IN CLUSTERS    Culture   Final    ABUNDANT METHICILLIN RESISTANT STAPHYLOCOCCUS AUREUS NO ANAEROBES ISOLATED; CULTURE IN PROGRESS FOR 5 DAYS    Report Status 02/23/2017 FINAL  Final   Organism ID, Bacteria METHICILLIN RESISTANT STAPHYLOCOCCUS AUREUS  Final      Susceptibility   Methicillin resistant staphylococcus aureus - MIC*    CIPROFLOXACIN <=0.5 SENSITIVE Sensitive     ERYTHROMYCIN >=8 RESISTANT Resistant     GENTAMICIN <=0.5 SENSITIVE Sensitive     OXACILLIN >=4 RESISTANT Resistant     TETRACYCLINE <=1 SENSITIVE Sensitive     VANCOMYCIN 1 SENSITIVE Sensitive     TRIMETH/SULFA <=10 SENSITIVE Sensitive     CLINDAMYCIN <=0.25 SENSITIVE Sensitive     RIFAMPIN <=0.5 SENSITIVE Sensitive     Inducible Clindamycin NEGATIVE Sensitive     * ABUNDANT METHICILLIN RESISTANT STAPHYLOCOCCUS AUREUS  Culture, blood (routine x 2)     Status: None (Preliminary result)   Collection Time: 02/20/17  12:19 PM  Result Value Ref Range Status   Specimen Description BLOOD CENTRAL LINE  Final   Special Requests IN PEDIATRIC BOTTLE Blood Culture adequate volume  Final   Culture NO GROWTH 2 DAYS  Final  Report Status PENDING  Incomplete  Culture, blood (routine x 2)     Status: None (Preliminary result)   Collection Time: 02/20/17 12:19 PM  Result Value Ref Range Status   Specimen Description BLOOD A-LINE  Final   Special Requests IN PEDIATRIC BOTTLE Blood Culture adequate volume  Final   Culture NO GROWTH 2 DAYS  Final   Report Status PENDING  Incomplete   ASSESSMENT: Complicated MRSA prostate abscesses - improved right sided component, however still with a sizeable, complex multiloculated fluid collections remaining in central/left prostate; unable to drain with IR approach. 27 cc from drain yesterday, 50 cc out previous day.   Untreated Hep C - viral load pending, HIV negative.   PLAN: 1. Continue IV Vancomycin. Course and duration of treatment pending for now.  2. Recommended Urology to consider transurethral drainage of remaining abscesses 3. Discussed Hepatitis C treatment - he is interested in pursuing this. If he were to follow up in our clinic for this we also have a substance abuse/Mental Health counselor available to work with him, which he is most interested in. Will discuss closer to discharge.     Rexene Alberts, MSN, NP-C [facility] for Infectious Disease [facility] Cell: [phone] Pager: [phone]  02/23/2017  11:03 AM

## 2017-02-23 NOTE — Progress Notes (Signed)
[facility] Infusion Coordinator will follow with ID team to support IV ABX at home if ordered/needed based on surgery team and ID recommendations.  If patient discharges after hours, please call [phone].   [name] 02/23/2017, 9:05 AM

## 2017-02-24 ENCOUNTER — Inpatient Hospital Stay (HOSPITAL_COMMUNITY): Payer: Medicare Other

## 2017-02-24 DIAGNOSIS — F1910 Other psychoactive substance abuse, uncomplicated: Secondary | ICD-10-CM

## 2017-02-24 DIAGNOSIS — F329 Major depressive disorder, single episode, unspecified: Secondary | ICD-10-CM

## 2017-02-24 DIAGNOSIS — E872 Acidosis: Secondary | ICD-10-CM

## 2017-02-24 DIAGNOSIS — I509 Heart failure, unspecified: Secondary | ICD-10-CM

## 2017-02-24 DIAGNOSIS — Z5181 Encounter for therapeutic drug level monitoring: Secondary | ICD-10-CM

## 2017-02-24 LAB — GLUCOSE, CAPILLARY
GLUCOSE-CAPILLARY: 63 mg/dL — AB (ref 65–99)
GLUCOSE-CAPILLARY: 65 mg/dL (ref 65–99)
Glucose-Capillary: 110 mg/dL — ABNORMAL HIGH (ref 65–99)
Glucose-Capillary: 88 mg/dL (ref 65–99)
Glucose-Capillary: 202 mg/dL — ABNORMAL HIGH (ref 65–99)

## 2017-02-24 LAB — SURGICAL PCR SCREEN
MRSA, PCR: POSITIVE — AB
STAPHYLOCOCCUS AUREUS: POSITIVE — AB

## 2017-02-24 MED ORDER — MIDAZOLAM HCL 2 MG/2ML IJ SOLN
INTRAMUSCULAR | Status: AC | PRN
  Administered 2017-02-24: 0.5 mg via INTRAVENOUS
  Administered 2017-02-24: 1 mg via INTRAVENOUS

## 2017-02-24 MED ORDER — MIDAZOLAM HCL 2 MG/2ML IJ SOLN
INTRAMUSCULAR | Status: AC
  Filled 2017-02-24: qty 4

## 2017-02-24 MED ORDER — SODIUM CHLORIDE 0.9 % IV SOLN
INTRAVENOUS | Status: AC | PRN
  Administered 2017-02-24: 10 mL/h via INTRAVENOUS

## 2017-02-24 MED ORDER — FENTANYL CITRATE (PF) 100 MCG/2ML IJ SOLN
INTRAMUSCULAR | Status: AC | PRN
  Administered 2017-02-24 (×2): 50 ug via INTRAVENOUS

## 2017-02-24 MED ORDER — INSULIN GLARGINE 100 UNIT/ML ~~LOC~~ SOLN
14.0000 [IU] | Freq: Every day | SUBCUTANEOUS | Status: DC
  Filled 2017-02-24: qty 0.14

## 2017-02-24 MED ORDER — INSULIN GLARGINE 100 UNIT/ML ~~LOC~~ SOLN: 12.0000 [IU] | Freq: Every day | SUBCUTANEOUS | Status: DC

## 2017-02-24 MED ORDER — LIDOCAINE-EPINEPHRINE 1 %-1:100000 IJ SOLN
INTRAMUSCULAR | Status: AC
  Filled 2017-02-24: qty 1

## 2017-02-24 MED ORDER — FENTANYL CITRATE (PF) 100 MCG/2ML IJ SOLN
INTRAMUSCULAR | Status: AC
  Filled 2017-02-24: qty 4

## 2017-02-24 MED ORDER — SODIUM BICARBONATE 650 MG PO TABS
650.0000 mg | ORAL_TABLET | Freq: Two times a day (BID) | ORAL | Status: DC
  Administered 2017-02-24 – 2017-02-28 (×9): 650 mg via ORAL
  Filled 2017-02-24 (×9): qty 1

## 2017-02-24 MED ORDER — ENSURE ENLIVE PO LIQD
237.0000 mL | Freq: Three times a day (TID) | ORAL | Status: DC
  Administered 2017-02-24 – 2017-02-28 (×10): 237 mL via ORAL

## 2017-02-24 MED ORDER — DEXTROSE 50 % IV SOLN
25.0000 mL | Freq: Once | INTRAVENOUS | Status: AC
  Administered 2017-02-24: 25 mL via INTRAVENOUS
  Filled 2017-02-24: qty 50

## 2017-02-24 MED ORDER — INSULIN GLARGINE 100 UNIT/ML ~~LOC~~ SOLN
10.0000 [IU] | Freq: Every day | SUBCUTANEOUS | Status: DC
  Administered 2017-02-24 – 2017-02-28 (×5): 10 [IU] via SUBCUTANEOUS
  Filled 2017-02-24 (×5): qty 0.1

## 2017-02-24 NOTE — Sedation Documentation (Signed)
Patient is resting comfortably. 

## 2017-02-24 NOTE — Progress Notes (Signed)
Pt blood sugar 66. Pt NPO so MD notified.per MD since patient is not symptomatic will not treat at this time. But today do not give novolog unless eating and Ok to still give lantus. She is ordered a lower dose

## 2017-02-24 NOTE — Progress Notes (Addendum)
Pt [name] 64. Pt complains of hallucinations are far as seeing nurses in room when not. MD notified and called back and ordered D5   1238- adminsterd d50 42ml. Called whitney in IR to give and update and request to recheck blood sugar while there. Per whitney will recheck in about 15 mintues

## 2017-02-24 NOTE — Progress Notes (Signed)
[facility]  Date of Admission:  02/17/2017    Total days of antibiotics          6             Vancomycin 10/27 >>             Ceftriaxone 10/27 >>  10/28             Zosyn 10/27             Metronidazole 10/27  Patient ID: 61 yo AA male admitted for diarrhea and dysuria. Found to have MRSA prostate abscesses s/p IR perc drain placement 10/27.           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 ARIPiprazole  5 mg Oral Daily  . aspirin  81 mg Oral Daily  . atorvastatin  20 mg Oral q1800  . buPROPion  150 mg Oral Daily  . carvedilol  3.125 mg Oral BID  . digoxin  0.125 mg Oral Daily  . feeding supplement (ENSURE ENLIVE)  237 mL Oral TID BM  . insulin aspart  0-9 Units Subcutaneous TID WC  . insulin glargine  10 Units Subcutaneous Daily  . mupirocin ointment  1 application Nasal BID  . nystatin  5 mL Oral QID  . rivaroxaban  20 mg Oral Q supper  . sertraline  100 mg Oral QHS  . sodium chloride flush  5 mL Intravenous Q8H  . traZODone  100 mg Oral QHS    SUBJECTIVE: Feels ok. Kind of loopy with pain medications - tells me he often will start conversation with someone that is not actually there.    Review of Systems: Review of Systems  Constitutional: Negative for chills and fever.  HENT: Negative for tinnitus.   Eyes: Negative for blurred vision and photophobia.  Respiratory: Negative for cough and sputum production.   Cardiovascular: Negative for chest pain.  Gastrointestinal: Negative for abdominal pain, diarrhea, nausea and vomiting.  Genitourinary: Negative for dysuria.  Skin: Negative for rash.  Neurological: Negative for headaches.    Psychiatric/Behavioral: Positive for depression and substance abuse.    No Known Allergies  OBJECTIVE: Vitals:   02/23/17 1415 02/23/17 2252 02/24/17 0500 02/24/17 0553  BP: 114/72 134/84  113/71  Pulse: 75 68  68  Resp: 17 20  18   Temp: 97.8 F (36.6 C) 97.8 F (36.6 C)  (!) 97.4 F (36.3 C)  TempSrc: Oral Oral  Oral  SpO2: 96% 100%  100%  Weight:   159 lb 11.2 oz (72.4 kg)   Height:       Body mass index is 20.23 kg/m.  Physical Exam  Constitutional: He is oriented to person, place, and time and well-developed, well-nourished, and in no distress.  HENT:  Mouth/Throat: No oral lesions. Normal dentition. No dental caries.  Eyes: No scleral icterus.  Cardiovascular: Normal rate, regular rhythm and normal heart sounds.   Pulmonary/Chest: Effort normal and breath sounds normal.  Abdominal: Soft. He exhibits no distension. There is no tenderness.  Lymphadenopathy:    He has no cervical adenopathy.  Neurological: He is alert and oriented to person, place, and time.  Skin: Skin is warm and dry. No rash noted.  Psychiatric: Affect normal. He exhibits a depressed mood.    Lab Results Lab Results  Component Value Date   WBC 6.6 02/22/2017   HGB 12.2 (L) 02/22/2017   HCT 35.7 (L) 02/22/2017   MCV 84.2 02/22/2017   PLT 330 02/22/2017    Lab Results  Component Value Date   CREATININE 1.00 02/23/2017   BUN 22 (H) 02/23/2017   NA 133 (L) 02/23/2017   K 4.5 02/23/2017   CL 109 02/23/2017   CO2 15 (L) 02/23/2017    Lab Results  Component Value Date   ALT 27 02/22/2017   AST 44 (H) 02/22/2017   ALKPHOS 146 (H) 02/22/2017   BILITOT 0.7 02/22/2017     Microbiology: Recent Results (from the past 240 hour(s))  Blood culture (routine x 2)     Status: None   Collection Time: 02/18/17  4:40 AM  Result Value Ref Range Status   Specimen Description BLOOD LEFT ARM  Final   Special Requests   Final    BOTTLES DRAWN AEROBIC AND ANAEROBIC Blood Culture adequate volume    Culture NO GROWTH 5 DAYS  Final   Report Status 02/23/2017 FINAL  Final  Blood culture (routine x 2)     Status: None   Collection Time: 02/18/17  5:09 AM  Result Value Ref Range Status   Specimen Description BLOOD RIGHT ARM  Final   Special Requests   Final    BOTTLES DRAWN AEROBIC ONLY Blood Culture adequate volume   Culture NO GROWTH 5 DAYS  Final   Report Status 02/23/2017 FINAL  Final  Aerobic/Anaerobic Culture (surgical/deep wound)     Status: None   Collection Time: 02/18/17  4:13 PM  Result Value Ref Range Status   Specimen Description ABSCESS DRAIN  Final   Special Requests NONE  Final   Gram Stain   Final    ABUNDANT WBC PRESENT, PREDOMINANTLY PMN ABUNDANT GRAM POSITIVE COCCI IN CLUSTERS    Culture   Final    ABUNDANT METHICILLIN RESISTANT STAPHYLOCOCCUS AUREUS NO ANAEROBES ISOLATED; CULTURE IN PROGRESS FOR 5 DAYS    Report Status 02/23/2017 FINAL  Final   Organism ID, Bacteria METHICILLIN RESISTANT STAPHYLOCOCCUS AUREUS  Final      Susceptibility   Methicillin resistant staphylococcus aureus - MIC*    CIPROFLOXACIN <=0.5 SENSITIVE Sensitive     ERYTHROMYCIN >=8 RESISTANT Resistant     GENTAMICIN <=0.5 SENSITIVE Sensitive     OXACILLIN >=4 RESISTANT Resistant     TETRACYCLINE <=1 SENSITIVE Sensitive     VANCOMYCIN 1 SENSITIVE Sensitive     TRIMETH/SULFA <=10 SENSITIVE Sensitive     CLINDAMYCIN <=0.25 SENSITIVE Sensitive     RIFAMPIN <=0.5 SENSITIVE Sensitive     Inducible Clindamycin NEGATIVE Sensitive     * ABUNDANT METHICILLIN RESISTANT STAPHYLOCOCCUS AUREUS  Culture, blood (routine x 2)     Status: None (Preliminary result)   Collection Time: 02/20/17 12:19 PM  Result Value Ref Range Status   Specimen Description BLOOD CENTRAL LINE  Final   Special Requests IN PEDIATRIC BOTTLE Blood Culture adequate volume  Final   Culture NO GROWTH 3 DAYS  Final   Report Status PENDING  Incomplete  Culture, blood (routine x 2)     Status: None (Preliminary result)    Collection Time: 02/20/17 12:19 PM  Result Value Ref Range Status   Specimen Description BLOOD A-LINE  Final   Special Requests IN PEDIATRIC BOTTLE Blood  Culture adequate volume  Final   Culture NO GROWTH 3 DAYS  Final   Report Status PENDING  Incomplete  Surgical pcr screen     Status: Abnormal   Collection Time: 02/24/17 12:11 AM  Result Value Ref Range Status   MRSA, PCR POSITIVE (A) NEGATIVE Final    Comment: RESULT CALLED TO, READ BACK BY AND VERIFIED WITH: T.OLOFIN RN 02/24/17 0337 L.CHAMPION    Staphylococcus aureus POSITIVE (A) NEGATIVE Final    Comment: (NOTE) The Xpert SA Assay (FDA approved for NASAL specimens in patients 62 years of age and older), is one component of a comprehensive surveillance program. It is not intended to diagnose infection nor to guide or monitor treatment.    ASSESSMENT: Complicated MRSA prostate abscesses - improved right sided component, however still with a sizeable, complex multiloculated fluid collections remaining in central/left prostate; Will undergo second attempt at drain placement in IR for remaining large abscess. Vanc trough 28  Untreated Hep C - viral load pending, HIV negative.   PLAN:  1. Continue IV Vancomycin - Course and duration of treatment pending for now to see if 2nd attempt with IR is successful.  2. Medication monitoring - Vanc trough a little high today at 28 - appreciate pharmacy's assistance for dosing. Creatinine improved since admission and 1.0 today.  3. Advanced CHF with EF 10-15% - entresto still on hold. Per primary team.  4. Discussed Hepatitis C treatment - defer to outpatient, however he has a lot of other things like his HF to work on first.     Rexene AlbertsStephanie Emili Mcloughlin, MSN, NP-C [facility] Cell: [phone] Pager: [phone]  02/24/2017  10:58 AM

## 2017-02-24 NOTE — Procedures (Signed)
Pre procedural Dx: Prostatic fluid collection Post procedural Dx: Same  Technically successful CT guided aspiration of approximately 3 cc of bloody fluid from the left side of the prostate gland.  Given lack of purulent aspirate, a drain was NOT placed.  All aspirated samples sent to the laboratory for analysis.    EBL: None  Complications: None immediate  Katherina Right, MD Pager #: [phone] '

## 2017-02-24 NOTE — Progress Notes (Signed)
Hypoglycemic Event  CBG: 66  Treatment: 15 GM carbohydrate snack  Symptoms: None  Follow-up CBG: Time 2352 CBG Result:119  Possible Reasons for Event: Unknown  Comments/MD notified: Schorr, NP    Zita Ozimek T Roma Kayser

## 2017-02-24 NOTE — Progress Notes (Signed)
PROGRESS NOTE    [name]   ZOX:[id number]  DOB: Feb 11, 1956  DOA: 02/17/2017 PCP: [facility]   Brief Summary: 61 yo M with CHF EF 10-15%, HTN, IDDM who presents with few days dysuria, found to have sepsis from prostatitis, prostatic abscess.  Subjective: No complaints today. Sugar noted to be low this AM but he was asymptomatic. Later I was called by RN that he was having some visual hallucinations with sugars in the same range. No pain, nausea, vomiting, constipation.    Hospital Course:  MRSA Prostate abscess - has drain being managed by IR-  He has had further aspiration of 3 cc of bloody fluid from left prostate and sent to lab - 10/27- CT pelvis shows 5cm abscess in left lobe of prostate -  Culture growing MRSA,   -Continue vancomyin - appreciate ID follow up - Urology recommends 3 wks of antibiotics, cont foley 7-10 days and f/u in office for voiding trial  Thrush - 20/31- started Nystatin- is improving  Mild AKI with acidosis - Cr Improved with fluids which are now on hold, still mildly acidotic on Bmet but appears to be compensating on ABG -  Furosemide and Entresto  - Strict I/Os -  daily weights - 72 kg >> 70.9 >> 72 - start Bicarb tabs  Hyponatremia Improved to baseline  Chronic systolic CHF- V tach - EF 10-15%, not candidate for Adv therapies per HF clinic notes. -Hold furosemide and Entresto for now given soft BP, AKI -Continue aspirin, statin, dig - Coreg resumed    Hypotensive with h/o HTN - required IV boluses -Holding Coreg, Entresto, Imdur, hydralazine, diuretics - following weights -10/31- resumed Coreg  Diabetes - Glargine is at 10 u rather than 20 U that he takes at home- AM sugar was 65 on 10/30-  sugars high- increased Lantus to 16 U yesterday and sugars are improved today -SSI with meals  Recent PE -Continue Xarelto- also on Aspirin  Other medications -Continue Abilify -Continue Wllbutrin, trazodone,  sertraline  Alcohol use - no signs of withdrawal    DVT prophylaxis: Xarelto Code Status: Full code Family Communication:  Disposition Plan:  Consultants:   Urology  IR Procedures:   IR guided drain 02/19/17  Antimicrobials:  Anti-infectives    Start     Dose/Rate Route Frequency Ordered Stop   02/23/17 2200  vancomycin (VANCOCIN) IVPB 1000 mg/200 mL premix     1,000 mg 200 mL/hr over 60 Minutes Intravenous Every 24 hours 02/23/17 1317     02/19/17 2300  vancomycin (VANCOCIN) IVPB 750 mg/150 ml premix  Status:  Discontinued     750 mg 150 mL/hr over 60 Minutes Intravenous Every 12 hours 02/19/17 1041 02/23/17 1317   02/19/17 1100  vancomycin (VANCOCIN) 1,250 mg in sodium chloride 0.9 % 250 mL IVPB     1,250 mg 166.7 mL/hr over 90 Minutes Intravenous  Once 02/19/17 1041 02/19/17 1343   02/18/17 1400  metroNIDAZOLE (FLAGYL) IVPB 500 mg  Status:  Discontinued     500 mg 100 mL/hr over 60 Minutes Intravenous Every 8 hours 02/18/17 0623 02/18/17 1244   02/18/17 0700  cefTRIAXone (ROCEPHIN) 2 g in dextrose 5 % 50 mL IVPB  Status:  Discontinued     2 g 100 mL/hr over 30 Minutes Intravenous Every 24 hours 02/18/17 0648 02/20/17 1109   02/18/17 0530  metroNIDAZOLE (FLAGYL) IVPB 500 mg     500 mg 100 mL/hr over 60 Minutes Intravenous  Once 02/18/17 0520  02/18/17 0834   02/18/17 0500  vancomycin (VANCOCIN) IVPB 1000 mg/200 mL premix     1,000 mg 200 mL/hr over 60 Minutes Intravenous  Once 02/18/17 0448 02/18/17 0648   02/18/17 0500  piperacillin-tazobactam (ZOSYN) IVPB 3.375 g     3.375 g 100 mL/hr over 30 Minutes Intravenous  Once 02/18/17 0448 02/18/17 0616       Objective: Vitals:   02/24/17 1325 02/24/17 1331 02/24/17 1335 02/24/17 1359  BP: 123/87 138/87 126/83 103/71  Pulse: 64 67 66 (!) 52  Resp: 20 18 19 16   Temp:      TempSrc:      SpO2: 100% 99% 94% 98%  Weight:      Height:        Intake/Output Summary (Last 24 hours) at 02/24/17 1442 Last data filed  at 02/24/17 0650  Gross per 24 hour  Intake              200 ml  Output              320 ml  Net             -120 ml   Filed Weights   02/22/17 0500 02/23/17 0500 02/24/17 0500  Weight: 70.9 kg (156 lb 6.4 oz) 72.5 kg (159 lb 12.8 oz) 72.4 kg (159 lb 11.2 oz)    Examination: General exam: Appears comfortable  HEENT: PERRLA, oral mucosa moist, no sclera icterus or thrush Respiratory system: Clear to auscultation. Respiratory effort normal. Cardiovascular system: S1 & S2 heard, RRR.  No murmurs  Gastrointestinal system: Abdomen soft, non-tender, nondistended. Normal bowel sound. No organomegaly Central nervous system: Alert and oriented. No focal neurological deficits. Extremities: No cyanosis, clubbing or edema Skin: No rashes or ulcers Psychiatry:  Mood & affect appropriate.     Data Reviewed: I have personally reviewed following labs and imaging studies  CBC:  Recent Labs Lab 02/17/17 2148 02/19/17 0353 02/20/17 0551 02/21/17 0739 02/22/17 0719  WBC 9.4 10.9* 7.1 7.1 6.6  HGB 13.2 11.8* 11.9* 11.7* 12.2*  HCT 37.4* 34.2* 35.0* 34.7* 35.7*  MCV 83.5 83.6 84.7 84.4 84.2  PLT 398 342 343 311 330   Basic Metabolic Panel:  Recent Labs Lab 02/19/17 0353 02/20/17 0551 02/21/17 0739 02/22/17 0719 02/23/17 0607  NA 133* 133* 134* 133* 133*  K 3.8 4.1 3.9 4.2 4.5  CL 104 107 109 108 109  CO2 19* 16* 18* 17* 15*  GLUCOSE 71 82 66 195* 113*  BUN 25* 26* 27* 24* 22*  CREATININE 1.39* 1.44* 1.22 1.12 1.00  CALCIUM 7.6* 7.7* 7.9* 7.9* 7.9*  MG  --   --   --  1.6*  --    GFR: Estimated Creatinine Clearance: 78.4 mL/min (by C-G formula based on SCr of 1 mg/dL). Liver Function Tests:  Recent Labs Lab 02/17/17 2148 02/19/17 0353 02/22/17 0719  AST 21 19 44*  ALT 14* 13* 27  ALKPHOS 126 93 146*  BILITOT 0.8 1.0 0.7  PROT 8.3* 6.3* 6.9  ALBUMIN 2.2* 1.7* 1.8*    Recent Labs Lab 02/17/17 2148  LIPASE 92*   No results for input(s): AMMONIA in the last 168  hours. Coagulation Profile: No results for input(s): INR, PROTIME in the last 168 hours. Cardiac Enzymes: No results for input(s): CKTOTAL, CKMB, CKMBINDEX, TROPONINI in the last 168 hours. BNP (last 3 results) No results for input(s): PROBNP in the last 8760 hours. HbA1C: No results for input(s): HGBA1C in the last 72  hours. CBG:  Recent Labs Lab 02/23/17 2250 02/23/17 2352 02/24/17 0748 02/24/17 1211 02/24/17 1307  GLUCAP 66 119* 63* 65 110*   Lipid Profile: No results for input(s): CHOL, HDL, LDLCALC, TRIG, CHOLHDL, LDLDIRECT in the last 72 hours. Thyroid Function Tests: No results for input(s): TSH, T4TOTAL, FREET4, T3FREE, THYROIDAB in the last 72 hours. Anemia Panel: No results for input(s): VITAMINB12, FOLATE, FERRITIN, TIBC, IRON, RETICCTPCT in the last 72 hours. Urine analysis:    Component Value Date/Time   COLORURINE YELLOW 02/18/2017 0311   APPEARANCEUR TURBID (A) 02/18/2017 0311   LABSPEC 1.017 02/18/2017 0311   PHURINE 6.0 02/18/2017 0311   GLUCOSEU >=500 (A) 02/18/2017 0311   HGBUR MODERATE (A) 02/18/2017 0311   BILIRUBINUR NEGATIVE 02/18/2017 0311   KETONESUR NEGATIVE 02/18/2017 0311   PROTEINUR >=300 (A) 02/18/2017 0311   UROBILINOGEN 2.0 (H) 02/20/2014 1339   NITRITE NEGATIVE 02/18/2017 0311   LEUKOCYTESUR LARGE (A) 02/18/2017 0311   Sepsis Labs: @LABRCNTIP (procalcitonin:4,lacticidven:4) ) Recent Results (from the past 240 hour(s))  Blood culture (routine x 2)     Status: None   Collection Time: 02/18/17  4:40 AM  Result Value Ref Range Status   Specimen Description BLOOD LEFT ARM  Final   Special Requests   Final    BOTTLES DRAWN AEROBIC AND ANAEROBIC Blood Culture adequate volume   Culture NO GROWTH 5 DAYS  Final   Report Status 02/23/2017 FINAL  Final  Blood culture (routine x 2)     Status: None   Collection Time: 02/18/17  5:09 AM  Result Value Ref Range Status   Specimen Description BLOOD RIGHT ARM  Final   Special Requests   Final     BOTTLES DRAWN AEROBIC ONLY Blood Culture adequate volume   Culture NO GROWTH 5 DAYS  Final   Report Status 02/23/2017 FINAL  Final  Aerobic/Anaerobic Culture (surgical/deep wound)     Status: None   Collection Time: 02/18/17  4:13 PM  Result Value Ref Range Status   Specimen Description ABSCESS DRAIN  Final   Special Requests NONE  Final   Gram Stain   Final    ABUNDANT WBC PRESENT, PREDOMINANTLY PMN ABUNDANT GRAM POSITIVE COCCI IN CLUSTERS    Culture   Final    ABUNDANT METHICILLIN RESISTANT STAPHYLOCOCCUS AUREUS NO ANAEROBES ISOLATED; CULTURE IN PROGRESS FOR 5 DAYS    Report Status 02/23/2017 FINAL  Final   Organism ID, Bacteria METHICILLIN RESISTANT STAPHYLOCOCCUS AUREUS  Final      Susceptibility   Methicillin resistant staphylococcus aureus - MIC*    CIPROFLOXACIN <=0.5 SENSITIVE Sensitive     ERYTHROMYCIN >=8 RESISTANT Resistant     GENTAMICIN <=0.5 SENSITIVE Sensitive     OXACILLIN >=4 RESISTANT Resistant     TETRACYCLINE <=1 SENSITIVE Sensitive     VANCOMYCIN 1 SENSITIVE Sensitive     TRIMETH/SULFA <=10 SENSITIVE Sensitive     CLINDAMYCIN <=0.25 SENSITIVE Sensitive     RIFAMPIN <=0.5 SENSITIVE Sensitive     Inducible Clindamycin NEGATIVE Sensitive     * ABUNDANT METHICILLIN RESISTANT STAPHYLOCOCCUS AUREUS  Culture, blood (routine x 2)     Status: None (Preliminary result)   Collection Time: 02/20/17 12:19 PM  Result Value Ref Range Status   Specimen Description BLOOD CENTRAL LINE  Final   Special Requests IN PEDIATRIC BOTTLE Blood Culture adequate volume  Final   Culture NO GROWTH 4 DAYS  Final   Report Status PENDING  Incomplete  Culture, blood (routine x 2)  Status: None (Preliminary result)   Collection Time: 02/20/17 12:19 PM  Result Value Ref Range Status   Specimen Description BLOOD A-LINE  Final   Special Requests IN PEDIATRIC BOTTLE Blood Culture adequate volume  Final   Culture NO GROWTH 4 DAYS  Final   Report Status PENDING  Incomplete  Surgical pcr  screen     Status: Abnormal   Collection Time: 02/24/17 12:11 AM  Result Value Ref Range Status   MRSA, PCR POSITIVE (A) NEGATIVE Final    Comment: RESULT CALLED TO, READ BACK BY AND VERIFIED WITH: T.OLOFIN RN 02/24/17 0337 L.CHAMPION    Staphylococcus aureus POSITIVE (A) NEGATIVE Final    Comment: (NOTE) The Xpert SA Assay (FDA approved for NASAL specimens in patients 95 years of age and older), is one component of a comprehensive surveillance program. It is not intended to diagnose infection nor to guide or monitor treatment.          Radiology Studies: No results found.    Scheduled Meds: . ARIPiprazole  5 mg Oral Daily  . aspirin  81 mg Oral Daily  . atorvastatin  20 mg Oral q1800  . buPROPion  150 mg Oral Daily  . carvedilol  3.125 mg Oral BID  . digoxin  0.125 mg Oral Daily  . feeding supplement (ENSURE ENLIVE)  237 mL Oral TID BM  . fentaNYL      . insulin aspart  0-9 Units Subcutaneous TID WC  . insulin glargine  10 Units Subcutaneous Daily  . lidocaine-EPINEPHrine      . midazolam      . mupirocin ointment  1 application Nasal BID  . nystatin  5 mL Oral QID  . rivaroxaban  20 mg Oral Q supper  . sertraline  100 mg Oral QHS  . sodium chloride flush  5 mL Intravenous Q8H  . traZODone  100 mg Oral QHS   Continuous Infusions: . vancomycin Stopped (02/24/17 0055)     LOS: 6 days    Time spent in minutes: 35    Calvert Cantor, MD [facility] Pager: [email or web address] Password [email or web address] 02/24/2017, 2:42 PM

## 2017-02-24 NOTE — Progress Notes (Addendum)
Physical Therapy Treatment/Vestibular Assessment Patient Details Name: [name] MRN: [id number] DOB: Jan 17, 1956 Today's Date: 02/24/2017    History of Present Illness 61 y.o.male,w hypertension, dm2, cad, CHF 10-15%) apparently c/o burning w urination starting 2 days prior to admission (PTA). Pt has had diarrhea for 4 days PTA.  + Prostate abscess s/p IR drain placement 02/18/17.    PT Comments    Vestibular testing WNL.  No signs of vestibular dysfunction.  Pt seems weak and lightheaded.  Orthostatics taken and were generally WNL (although he could not stand long enough to get 3 min BP).  HR did dip to 40 and topped out at 68 during our testing.  He is NPO for a procedure later today and CBGs have been low overnight and this AM.  PT will follow acutely for activity progression.    Follow Up Recommendations  SNF;Supervision/Assistance - 24 hour     Equipment Recommendations  Rolling walker with 5" wheels    Recommendations for Other Services    NA    Precautions / Restrictions Precautions Precautions: Fall Restrictions Weight Bearing Restrictions: No    Mobility  Bed Mobility Overal bed mobility: Needs Assistance Bed Mobility: Rolling;Supine to Sit;Sit to Supine Rolling: Modified independent (Device/Increase time)   Supine to sit: Min assist Sit to supine: Supervision   General bed mobility comments: Min hand held assist to pull up to sitting EOB, supervision rolling with pt using railing for leverage.  Supervision to return to supine due to slow painful looking transition.   Transfers Overall transfer level: Needs assistance Equipment used: Rolling walker (2 wheeled) Transfers: Sit to/from Stand Sit to Stand: Min guard         General transfer comment: Min assist to support trunk during transition to stand EOB.   Ambulation/Gait             General Gait Details: NT today, pt NPO, CBG is 66 and he is weak and lightheaded.           Balance  Overall balance assessment: Needs assistance Sitting-balance support: Feet supported;Bilateral upper extremity supported Sitting balance-Leahy Scale: Fair     Standing balance support: Bilateral upper extremity supported Standing balance-Leahy Scale: Poor       02/24/17 1212  Vestibular Assessment  General Observation Pt reports no tinnitus, fullness, wears glasses for reading, generally reports lightheaded "like I am going to go out"  Symptom Behavior  Type of Dizziness Lightheadedness  Frequency of Dizziness when upright  Duration of Dizziness while upright  Aggravating Factors Sit to stand;Supine to sit  Relieving Factors Lying supine  Occulomotor Exam  Occulomotor Alignment Normal  Spontaneous Absent  Gaze-induced Absent  Smooth Pursuits Saccades  Saccades Intact  Vestibulo-Occular Reflex  VOR 1 Head Only (x 1 viewing) normal no symptoms both vertical and hoizontal  Comment HIT normal bil  Auditory  Comments grossly equal finger rub test  Positional Testing  Dix-Hallpike (NT as all other testing was WNL)  Horizontal Canal Testing Horizontal Canal Right;Horizontal Canal Left  Horizontal Canal Right  Horizontal Canal Right Duration 0  Horizontal Canal Right Symptoms Normal  Horizontal Canal Left  Horizontal Canal Left Duration 0  Horizontal Canal Left Symptoms Normal                             Cognition Arousal/Alertness: Awake/alert Behavior During Therapy: Flat affect Overall Cognitive Status: No family/caregiver present to determine baseline cognitive functioning  General Comments: Not specifically tested             Pertinent Vitals/Pain Pain Assessment: Faces Faces Pain Scale: Hurts even more Pain Location: groin Pain Descriptors / Indicators: Guarding;Grimacing Pain Intervention(s): Limited activity within patient's tolerance;Monitored during session;Repositioned    02/24/17 1148  Vital Signs   Pulse Rate 65  Pulse Rate Source Dinamap  BP 106/76  BP Location Right Arm  BP Method Automatic  Patient Position (if appropriate) Orthostatic Vitals  Orthostatic Lying   BP- Lying 106/76  Pulse- Lying 65  Orthostatic Sitting  BP- Sitting 110/71  Pulse- Sitting (!) 40  Orthostatic Standing at 0 minutes  BP- Standing at 0 minutes 109/69  Pulse- Standing at 0 minutes 66  Orthostatic Standing at 3 minutes  BP- Standing at 3 minutes (unable to stand 3 mins)  Oxygen Therapy  SpO2 100 %  O2 Device Room Air        Prior Function            PT Goals (current goals can now be found in the care plan section) Acute Rehab PT Goals Patient Stated Goal: to feel better Progress towards PT goals: Progressing toward goals    Frequency    Min 2X/week      PT Plan Current plan remains appropriate       AM-PAC PT "6 Clicks" Daily Activity  Outcome Measure  Difficulty turning over in bed (including adjusting bedclothes, sheets and blankets)?: A Little Difficulty moving from lying on back to sitting on the side of the bed? : Unable Difficulty sitting down on and standing up from a chair with arms (e.g., wheelchair, bedside commode, etc,.)?: Unable Help needed moving to and from a bed to chair (including a wheelchair)?: A Little Help needed walking in hospital room?: A Lot Help needed climbing 3-5 steps with a railing? : A Lot 6 Click Score: 12    End of Session   Activity Tolerance: Patient limited by fatigue;Patient limited by pain Patient left: in bed;with call bell/phone within reach;with bed alarm set   PT Visit Diagnosis: Unsteadiness on feet (R26.81);Difficulty in walking, not elsewhere classified (R26.2);Pain Pain - part of body:  (groin)     Time: 9024-0973 PT Time Calculation (min) (ACUTE ONLY): 24 min  Charges:  $Therapeutic Activity: 23-37 mins    Cait Locust B. Soha Thorup, PT, DPT [phone]          02/24/2017, 12:11 PM

## 2017-02-25 LAB — BASIC METABOLIC PANEL
ANION GAP: 7 (ref 5–15)
BUN: 25 mg/dL — ABNORMAL HIGH (ref 6–20)
CALCIUM: 7.8 mg/dL — AB (ref 8.9–10.3)
CO2: 19 mmol/L — AB (ref 22–32)
Chloride: 107 mmol/L (ref 101–111)
Creatinine, Ser: 1.29 mg/dL — ABNORMAL HIGH (ref 0.61–1.24)
GFR calc non Af Amer: 58 mL/min — ABNORMAL LOW (ref >60)
Glucose, Bld: 166 mg/dL — ABNORMAL HIGH (ref 65–99)
Potassium: 4.4 mmol/L (ref 3.5–5.1)
Sodium: 133 mmol/L — ABNORMAL LOW (ref 135–145)

## 2017-02-25 LAB — GLUCOSE, CAPILLARY
GLUCOSE-CAPILLARY: 174 mg/dL — AB (ref 65–99)
GLUCOSE-CAPILLARY: 186 mg/dL — AB (ref 65–99)
GLUCOSE-CAPILLARY: 124 mg/dL — AB (ref 65–99)
Glucose-Capillary: 95 mg/dL (ref 65–99)

## 2017-02-25 LAB — CULTURE, BLOOD (ROUTINE X 2)
Culture: NO GROWTH
Culture: NO GROWTH
SPECIAL REQUESTS: ADEQUATE
Special Requests: ADEQUATE

## 2017-02-25 NOTE — Progress Notes (Signed)
PROGRESS NOTE    [name]   ZOX:[id number]  DOB: Apr 04, 1956  DOA: 02/17/2017 PCP: [facility]   Brief Summary: 61 yo M with CHF EF 10-15%, HTN, IDDM who presents with few days dysuria, found to have sepsis from prostatitis, prostatic abscess.  Subjective: No complaints today.   No pain, nausea, vomiting, constipation.    Hospital Course:  MRSA Prostate abscess - initially an aspiration and then a drain which was  managed by IR - 10/27- CT pelvis shows 5cm abscess in left lobe of prostate -  Culture growing MRSA,   -Continue vancomyin - appreciate ID follow up - Urology recommends 3 wks of antibiotics, cont foley 7-10 days and f/u in office for voiding trial -  11/2- He had had further aspiration of 3 cc of bloody fluid from left prostate - culture shows moderate staph aureus- susceptibilities pending - blood cultures negative  Thrush - 20/31- started Nystatin- is improving  Mild AKI with acidosis - Cr Improved with fluids which are now on hold, still mildly acidotic on Bmet but appears to be compensating on ABG -  Furosemide and Entresto on hold -   I/Os are inaccurate -  daily weights - 72 kg >> 70.9 >> 72>>70 - 11/2 started Bicarb tabs- acidosis slightly better  Hyponatremia Improved to baseline  Chronic systolic CHF- V tach - EF 10-15%, not candidate for Adv therapies per HF clinic notes. -Hold furosemide and Entresto for now given soft BP, AKI -Continue aspirin, statin, dig - Coreg resumed    Hypotensive with h/o HTN - required IV boluses -Holding Coreg, Entresto, Imdur, hydralazine, diuretics - following weights -10/31- resumed Coreg  Diabetes - Glargine was being given at 10 u rather than 20 U that he takes at home- AM sugar was 65 on 10/30-  sugars subsequently too high & increased Lantus to 16 U - subsequently sugars dropped to 60s- currently back to 10 u of Lantus -SSI with meals  Recent PE -Continue Xarelto- also on  Aspirin  Other medications -Continue Abilify -Continue Wllbutrin, trazodone, sertraline  Alcohol use - no signs of withdrawal    DVT prophylaxis: Xarelto Code Status: Full code Family Communication:  Disposition Plan:  Consultants:   Urology  IR Procedures:   IR guided drain 02/19/17 and 11/2  Antimicrobials:  Anti-infectives    Start     Dose/Rate Route Frequency Ordered Stop   02/23/17 2200  vancomycin (VANCOCIN) IVPB 1000 mg/200 mL premix     1,000 mg 200 mL/hr over 60 Minutes Intravenous Every 24 hours 02/23/17 1317     02/19/17 2300  vancomycin (VANCOCIN) IVPB 750 mg/150 ml premix  Status:  Discontinued     750 mg 150 mL/hr over 60 Minutes Intravenous Every 12 hours 02/19/17 1041 02/23/17 1317   02/19/17 1100  vancomycin (VANCOCIN) 1,250 mg in sodium chloride 0.9 % 250 mL IVPB     1,250 mg 166.7 mL/hr over 90 Minutes Intravenous  Once 02/19/17 1041 02/19/17 1343   02/18/17 1400  metroNIDAZOLE (FLAGYL) IVPB 500 mg  Status:  Discontinued     500 mg 100 mL/hr over 60 Minutes Intravenous Every 8 hours 02/18/17 0623 02/18/17 1244   02/18/17 0700  cefTRIAXone (ROCEPHIN) 2 g in dextrose 5 % 50 mL IVPB  Status:  Discontinued     2 g 100 mL/hr over 30 Minutes Intravenous Every 24 hours 02/18/17 0648 02/20/17 1109   02/18/17 0530  metroNIDAZOLE (FLAGYL) IVPB 500 mg     500  mg 100 mL/hr over 60 Minutes Intravenous  Once 02/18/17 0520 02/18/17 0834   02/18/17 0500  vancomycin (VANCOCIN) IVPB 1000 mg/200 mL premix     1,000 mg 200 mL/hr over 60 Minutes Intravenous  Once 02/18/17 0448 02/18/17 0648   02/18/17 0500  piperacillin-tazobactam (ZOSYN) IVPB 3.375 g     3.375 g 100 mL/hr over 30 Minutes Intravenous  Once 02/18/17 0448 02/18/17 0616       Objective: Vitals:   02/25/17 0430 02/25/17 0643 02/25/17 1004 02/25/17 1324  BP:  122/79 114/75 134/78  Pulse:  72 69 72  Resp:  18  20  Temp:  98.4 F (36.9 C)  97.6 F (36.4 C)  TempSrc:  Oral  Oral  SpO2:  99%   100%  Weight: 70.6 kg (155 lb 11.2 oz) 70.6 kg (155 lb 11.2 oz)    Height:        Intake/Output Summary (Last 24 hours) at 02/25/17 1517 Last data filed at 02/25/17 0326  Gross per 24 hour  Intake                0 ml  Output              300 ml  Net             -300 ml   Filed Weights   02/24/17 0500 02/25/17 0430 02/25/17 0643  Weight: 72.4 kg (159 lb 11.2 oz) 70.6 kg (155 lb 11.2 oz) 70.6 kg (155 lb 11.2 oz)    Examination: General exam: Appears comfortable  HEENT: PERRLA, oral mucosa moist, no sclera icterus or thrush Respiratory system: Clear to auscultation. Respiratory effort normal. Cardiovascular system: S1 & S2 heard, RRR.  No murmurs  Gastrointestinal system: Abdomen soft, non-tender, nondistended. Normal bowel sound. No organomegaly Central nervous system: Alert and oriented. No focal neurological deficits. Extremities: No cyanosis, clubbing or edema GU: foley and prostate drain present Skin: No rashes or ulcers Psychiatry:  Mood & affect appropriate.     Data Reviewed: I have personally reviewed following labs and imaging studies  CBC:  Recent Labs Lab 02/19/17 0353 02/20/17 0551 02/21/17 0739 02/22/17 0719  WBC 10.9* 7.1 7.1 6.6  HGB 11.8* 11.9* 11.7* 12.2*  HCT 34.2* 35.0* 34.7* 35.7*  MCV 83.6 84.7 84.4 84.2  PLT 342 343 311 330   Basic Metabolic Panel:  Recent Labs Lab 02/20/17 0551 02/21/17 0739 02/22/17 0719 02/23/17 0607 02/25/17 0652  NA 133* 134* 133* 133* 133*  K 4.1 3.9 4.2 4.5 4.4  CL 107 109 108 109 107  CO2 16* 18* 17* 15* 19*  GLUCOSE 82 66 195* 113* 166*  BUN 26* 27* 24* 22* 25*  CREATININE 1.44* 1.22 1.12 1.00 1.29*  CALCIUM 7.7* 7.9* 7.9* 7.9* 7.8*  MG  --   --  1.6*  --   --    GFR: Estimated Creatinine Clearance: 59.3 mL/min (A) (by C-G formula based on SCr of 1.29 mg/dL (H)). Liver Function Tests:  Recent Labs Lab 02/19/17 0353 02/22/17 0719  AST 19 44*  ALT 13* 27  ALKPHOS 93 146*  BILITOT 1.0 0.7  PROT  6.3* 6.9  ALBUMIN 1.7* 1.8*   No results for input(s): LIPASE, AMYLASE in the last 168 hours. No results for input(s): AMMONIA in the last 168 hours. Coagulation Profile: No results for input(s): INR, PROTIME in the last 168 hours. Cardiac Enzymes: No results for input(s): CKTOTAL, CKMB, CKMBINDEX, TROPONINI in the last 168 hours. BNP (last 3  results) No results for input(s): PROBNP in the last 8760 hours. HbA1C: No results for input(s): HGBA1C in the last 72 hours. CBG:  Recent Labs Lab 02/24/17 1307 02/24/17 1651 02/24/17 2327 02/25/17 0811 02/25/17 1227  GLUCAP 110* 88 202* 174* 186*   Lipid Profile: No results for input(s): CHOL, HDL, LDLCALC, TRIG, CHOLHDL, LDLDIRECT in the last 72 hours. Thyroid Function Tests: No results for input(s): TSH, T4TOTAL, FREET4, T3FREE, THYROIDAB in the last 72 hours. Anemia Panel: No results for input(s): VITAMINB12, FOLATE, FERRITIN, TIBC, IRON, RETICCTPCT in the last 72 hours. Urine analysis:    Component Value Date/Time   COLORURINE YELLOW 02/18/2017 0311   APPEARANCEUR TURBID (A) 02/18/2017 0311   LABSPEC 1.017 02/18/2017 0311   PHURINE 6.0 02/18/2017 0311   GLUCOSEU >=500 (A) 02/18/2017 0311   HGBUR MODERATE (A) 02/18/2017 0311   BILIRUBINUR NEGATIVE 02/18/2017 0311   KETONESUR NEGATIVE 02/18/2017 0311   PROTEINUR >=300 (A) 02/18/2017 0311   UROBILINOGEN 2.0 (H) 02/20/2014 1339   NITRITE NEGATIVE 02/18/2017 0311   LEUKOCYTESUR LARGE (A) 02/18/2017 0311   Sepsis Labs: @LABRCNTIP (procalcitonin:4,lacticidven:4) ) Recent Results (from the past 240 hour(s))  Blood culture (routine x 2)     Status: None   Collection Time: 02/18/17  4:40 AM  Result Value Ref Range Status   Specimen Description BLOOD LEFT ARM  Final   Special Requests   Final    BOTTLES DRAWN AEROBIC AND ANAEROBIC Blood Culture adequate volume   Culture NO GROWTH 5 DAYS  Final   Report Status 02/23/2017 FINAL  Final  Blood culture (routine x 2)     Status:  None   Collection Time: 02/18/17  5:09 AM  Result Value Ref Range Status   Specimen Description BLOOD RIGHT ARM  Final   Special Requests   Final    BOTTLES DRAWN AEROBIC ONLY Blood Culture adequate volume   Culture NO GROWTH 5 DAYS  Final   Report Status 02/23/2017 FINAL  Final  Aerobic/Anaerobic Culture (surgical/deep wound)     Status: None   Collection Time: 02/18/17  4:13 PM  Result Value Ref Range Status   Specimen Description ABSCESS DRAIN  Final   Special Requests NONE  Final   Gram Stain   Final    ABUNDANT WBC PRESENT, PREDOMINANTLY PMN ABUNDANT GRAM POSITIVE COCCI IN CLUSTERS    Culture   Final    ABUNDANT METHICILLIN RESISTANT STAPHYLOCOCCUS AUREUS NO ANAEROBES ISOLATED; CULTURE IN PROGRESS FOR 5 DAYS    Report Status 02/23/2017 FINAL  Final   Organism ID, Bacteria METHICILLIN RESISTANT STAPHYLOCOCCUS AUREUS  Final      Susceptibility   Methicillin resistant staphylococcus aureus - MIC*    CIPROFLOXACIN <=0.5 SENSITIVE Sensitive     ERYTHROMYCIN >=8 RESISTANT Resistant     GENTAMICIN <=0.5 SENSITIVE Sensitive     OXACILLIN >=4 RESISTANT Resistant     TETRACYCLINE <=1 SENSITIVE Sensitive     VANCOMYCIN 1 SENSITIVE Sensitive     TRIMETH/SULFA <=10 SENSITIVE Sensitive     CLINDAMYCIN <=0.25 SENSITIVE Sensitive     RIFAMPIN <=0.5 SENSITIVE Sensitive     Inducible Clindamycin NEGATIVE Sensitive     * ABUNDANT METHICILLIN RESISTANT STAPHYLOCOCCUS AUREUS  Culture, blood (routine x 2)     Status: None   Collection Time: 02/20/17 12:19 PM  Result Value Ref Range Status   Specimen Description BLOOD CENTRAL LINE  Final   Special Requests IN PEDIATRIC BOTTLE Blood Culture adequate volume  Final   Culture NO GROWTH 5 DAYS  Final   Report Status 02/25/2017 FINAL  Final  Culture, blood (routine x 2)     Status: None   Collection Time: 02/20/17 12:19 PM  Result Value Ref Range Status   Specimen Description BLOOD A-LINE  Final   Special Requests IN PEDIATRIC BOTTLE Blood  Culture adequate volume  Final   Culture NO GROWTH 5 DAYS  Final   Report Status 02/25/2017 FINAL  Final  Surgical pcr screen     Status: Abnormal   Collection Time: 02/24/17 12:11 AM  Result Value Ref Range Status   MRSA, PCR POSITIVE (A) NEGATIVE Final    Comment: RESULT CALLED TO, READ BACK BY AND VERIFIED WITH: T.OLOFIN RN 02/24/17 0337 L.CHAMPION    Staphylococcus aureus POSITIVE (A) NEGATIVE Final    Comment: (NOTE) The Xpert SA Assay (FDA approved for NASAL specimens in patients 64 years of age and older), is one component of a comprehensive surveillance program. It is not intended to diagnose infection nor to guide or monitor treatment.   Aerobic/Anaerobic Culture (surgical/deep wound)     Status: None (Preliminary result)   Collection Time: 02/24/17  1:51 PM  Result Value Ref Range Status   Specimen Description ABSCESS  Final   Special Requests POST CT GUIDED PROSTATIC FLUID  Final   Gram Stain   Final    ABUNDANT WBC PRESENT, PREDOMINANTLY PMN FEW GRAM POSITIVE COCCI IN CLUSTERS    Culture   Final    MODERATE STAPHYLOCOCCUS AUREUS SUSCEPTIBILITIES TO FOLLOW    Report Status PENDING  Incomplete         Radiology Studies: Ct Aspiration  Result Date: 02/24/2017 INDICATION: Prostatic abscess, post percutaneous drainage catheter placement on 02/18/2017. Subsequent pelvic CT performed 02/20/2017 demonstrated a persistent peripherally enhancing collection within the left side of the prostate. As such, request made for an additional CT-guided aspiration/drainage catheter placement. EXAM: CT-GUIDED ASPIRATION OF THE LEFT SIDE OF THE PROSTATE COMPARISON:  Pelvic CT - 02/20/2017; CT-guided prostate abscess drainage catheter placement -02/18/2017; CT abdomen and pelvis -02/18/2017 MEDICATIONS: The patient is currently admitted to the hospital and receiving intravenous antibiotics. The antibiotics were administered within an appropriate time frame prior to the initiation of the  procedure. ANESTHESIA/SEDATION: Moderate (conscious) sedation was employed during this procedure. A total of Versed 1.5 mg and Fentanyl 100 mcg was administered intravenously. Moderate Sedation Time: 10 minutes. The patient's level of consciousness and vital signs were monitored continuously by radiology nursing throughout the procedure under my direct supervision. CONTRAST:  None COMPLICATIONS: None immediate. PROCEDURE: Informed written consent was obtained from the patient after a discussion of the risks, benefits and alternatives to treatment. The patient was placed prone on the CT gantry and a pre procedural CT was performed re-demonstrating the known ill-defined fluid collection within the left side of the prostate with dominant ill-defined component measuring at least 3.8 x 3.8 cm (image 34, series 2). The procedure was planned. A timeout was performed prior to the initiation of the procedure. The skin overlying the left lower medial buttocks was prepped and draped in the usual sterile fashion. The overlying soft tissues were anesthetized with 1% lidocaine with epinephrine. Appropriate trajectory was planned with the use of a 22 gauge spinal needle. An 18 gauge trocar needle was advanced into the fluid collection. Appropriate position was confirmed, however only a scant amounts of dark bloody fluid was aspirated. Next, a short Amplatz super stiff wire was coiled within the prostate. Appropriate positioning was confirmed with a limited CT scan. The  trocar needle was exchanged for a 15 cm Yueh sheath catheter which was advanced over the guidewire. A total of approximately 3 cc of dark red non foul smelling blood was aspirated as the catheter was slowly withdrawn. The procedure was terminated. Superficial hemostasis was achieved with manual compression. A dressing was placed. The patient tolerated the procedure well without immediate post procedural complication. IMPRESSION: Technically successful CT-guided  aspiration of approximately 3 cc of bloody fluid from the left side of the prostate gland. Given lack of frank purulence, a percutaneous drainage catheter was not placed at this time. All aspirated fluid was capped and sent to the laboratory for analysis. Electronically Signed   By: Simonne Come M.D.   On: 02/24/2017 16:08      Scheduled Meds: . ARIPiprazole  5 mg Oral Daily  . aspirin  81 mg Oral Daily  . atorvastatin  20 mg Oral q1800  . buPROPion  150 mg Oral Daily  . carvedilol  3.125 mg Oral BID  . digoxin  0.125 mg Oral Daily  . feeding supplement (ENSURE ENLIVE)  237 mL Oral TID BM  . insulin aspart  0-9 Units Subcutaneous TID WC  . insulin glargine  10 Units Subcutaneous Daily  . nystatin  5 mL Oral QID  . rivaroxaban  20 mg Oral Q supper  . sertraline  100 mg Oral QHS  . sodium bicarbonate  650 mg Oral BID  . sodium chloride flush  5 mL Intravenous Q8H  . traZODone  100 mg Oral QHS   Continuous Infusions: . vancomycin Stopped (02/24/17 2245)     LOS: 7 days    Time spent in minutes: 35    Calvert Cantor, MD [facility] Pager: [email or web address] Password [email or web address] 02/25/2017, 3:17 PM

## 2017-02-26 LAB — BASIC METABOLIC PANEL
ANION GAP: 9 (ref 5–15)
BUN: 19 mg/dL (ref 6–20)
CALCIUM: 8 mg/dL — AB (ref 8.9–10.3)
CO2: 19 mmol/L — ABNORMAL LOW (ref 22–32)
CREATININE: 1.22 mg/dL (ref 0.61–1.24)
Chloride: 106 mmol/L (ref 101–111)
GLUCOSE: 83 mg/dL (ref 65–99)
Potassium: 4.5 mmol/L (ref 3.5–5.1)
Sodium: 134 mmol/L — ABNORMAL LOW (ref 135–145)

## 2017-02-26 LAB — GLUCOSE, CAPILLARY
GLUCOSE-CAPILLARY: 117 mg/dL — AB (ref 65–99)
GLUCOSE-CAPILLARY: 197 mg/dL — AB (ref 65–99)
Glucose-Capillary: 92 mg/dL (ref 65–99)

## 2017-02-26 LAB — CBC
HCT: 36.4 % — ABNORMAL LOW (ref 39.0–52.0)
HEMOGLOBIN: 12.2 g/dL — AB (ref 13.0–17.0)
MCH: 28.4 pg (ref 26.0–34.0)
MCHC: 33.5 g/dL (ref 30.0–36.0)
MCV: 84.8 fL (ref 78.0–100.0)
PLATELETS: 342 10*3/uL (ref 150–400)
RBC: 4.29 MIL/uL (ref 4.22–5.81)
RDW: 13 % (ref 11.5–15.5)
WBC: 5.5 10*3/uL (ref 4.0–10.5)

## 2017-02-26 LAB — VANCOMYCIN, TROUGH: VANCOMYCIN TR: 20 ug/mL (ref 15–20)

## 2017-02-26 MED ORDER — PRO-STAT SUGAR FREE PO LIQD
30.0000 mL | Freq: Two times a day (BID) | ORAL | Status: DC
  Administered 2017-02-26 – 2017-02-28 (×5): 30 mL via ORAL
  Filled 2017-02-26 (×5): qty 30

## 2017-02-26 MED ORDER — HYDRALAZINE HCL 10 MG PO TABS
10.0000 mg | ORAL_TABLET | Freq: Three times a day (TID) | ORAL | Status: DC
  Administered 2017-02-26 – 2017-02-28 (×6): 10 mg via ORAL
  Filled 2017-02-26 (×6): qty 1

## 2017-02-26 NOTE — Progress Notes (Signed)
PROGRESS NOTE    [name]   ZOX:[id number]  DOB: Dec 27, 1955  DOA: 02/17/2017 PCP: [facility]   Brief Summary: 61 yo M with CHF EF 10-15%, HTN, IDDM who presents with few days dysuria, found to have sepsis from prostatitis, prostatic abscess.  Subjective: No complaints.   Hospital Course:  MRSA Prostate abscess - initially an aspiration and then a drain which was  managed by IR - 10/27- CT pelvis shows 5cm abscess in left lobe of prostate -  Culture growing MRSA,   -Continue vancomyin - appreciate ID follow up - Urology recommends 3 wks of antibiotics, cont foley 7-10 days and f/u in office for voiding trial -  11/2- He had had further aspiration of 3 cc of bloody fluid from left prostate - culture shows moderate staph aureus- susceptibilities show it is susceptible to Cipro, Tetracycline, Bactrim, Clinda- ID considering Bactrim - blood cultures negative - still has ouput in his drain this AM but per RN, he did not last night   Thrush - 20/31- started Nystatin- Thrush has resolved- will continue treatment for now  Mild AKI with acidosis - Cr Improved with fluids which are now on hold, still mildly acidotic on Bmet but appears to be compensating on ABG -  Furosemide and Entresto on hold -   I/Os are inaccurate -  daily weights - 72 kg >> 70.9 >> 72>>70 - 11/2 started Bicarb tabs- acidosis slightly better but still remains at 19 - Cr has nomalized  Hyponatremia Improved to baseline  Chronic systolic CHF- V tach - EF 10-15%, not candidate for Adv therapies per HF clinic notes. -Hold furosemide and Entresto for now given soft BP, AKI -Continue aspirin, statin, dig - Coreg resumed    Hypotensive with h/o HTN - required IV boluses -Holding  Entresto, Imdur, hydralazine, diuretics - following weights -10/31- resumed Coreg- BP stable - cont Dig  Diabetes - Glargine was being given at 10 u rather than 20 U that he takes at home- AM sugar was 65 on  10/30-  sugars subsequently too high & increased Lantus to 16 U - subsequently sugars dropped to 60s- currently back to 10 u of Lantus -SSI with meals - sugars have been stable  Recent PE -Continue Xarelto- also on Aspirin  Other medications -Continue Abilify -Continue Wllbutrin, trazodone, sertraline  Alcohol use - no signs of withdrawal  Underweight/ severe protein calorie malnutrition/ hypoalbuminemia Body mass index is 19.91 kg/m.  -Ensure- Prostat    DVT prophylaxis: Xarelto Code Status: Full code Family Communication:  Disposition Plan:  Consultants:   Urology  IR Procedures:   IR guided drain 02/19/17 and 11/2  Antimicrobials:  Anti-infectives (From admission, onward)   Start     Dose/Rate Route Frequency Ordered Stop   02/23/17 2200  vancomycin (VANCOCIN) IVPB 1000 mg/200 mL premix     1,000 mg 200 mL/hr over 60 Minutes Intravenous Every 24 hours 02/23/17 1317     02/19/17 2300  vancomycin (VANCOCIN) IVPB 750 mg/150 ml premix  Status:  Discontinued     750 mg 150 mL/hr over 60 Minutes Intravenous Every 12 hours 02/19/17 1041 02/23/17 1317   02/19/17 1100  vancomycin (VANCOCIN) 1,250 mg in sodium chloride 0.9 % 250 mL IVPB     1,250 mg 166.7 mL/hr over 90 Minutes Intravenous  Once 02/19/17 1041 02/19/17 1343   02/18/17 1400  metroNIDAZOLE (FLAGYL) IVPB 500 mg  Status:  Discontinued     500 mg 100 mL/hr over 60  Minutes Intravenous Every 8 hours 02/18/17 0623 02/18/17 1244   02/18/17 0700  cefTRIAXone (ROCEPHIN) 2 g in dextrose 5 % 50 mL IVPB  Status:  Discontinued     2 g 100 mL/hr over 30 Minutes Intravenous Every 24 hours 02/18/17 0648 02/20/17 1109   02/18/17 0530  metroNIDAZOLE (FLAGYL) IVPB 500 mg     500 mg 100 mL/hr over 60 Minutes Intravenous  Once 02/18/17 0520 02/18/17 0834   02/18/17 0500  vancomycin (VANCOCIN) IVPB 1000 mg/200 mL premix     1,000 mg 200 mL/hr over 60 Minutes Intravenous  Once 02/18/17 0448 02/18/17 0648   02/18/17 0500   piperacillin-tazobactam (ZOSYN) IVPB 3.375 g     3.375 g 100 mL/hr over 30 Minutes Intravenous  Once 02/18/17 0448 02/18/17 0616       Objective: Vitals:   02/26/17 0540 02/26/17 0554 02/26/17 1034 02/26/17 1349  BP:  125/82 108/67 113/63  Pulse:  60 (!) 57 (!) 59  Resp:  20  16  Temp:  97.9 F (36.6 C)  (!) 97.5 F (36.4 C)  TempSrc:  Oral  Oral  SpO2:  100%  95%  Weight: 71.3 kg (157 lb 3.2 oz)     Height:        Intake/Output Summary (Last 24 hours) at 02/26/2017 1357 Last data filed at 02/26/2017 0532 Gross per 24 hour  Intake 14 ml  Output 600 ml  Net -586 ml   Filed Weights   02/25/17 0430 02/25/17 0643 02/26/17 0540  Weight: 70.6 kg (155 lb 11.2 oz) 70.6 kg (155 lb 11.2 oz) 71.3 kg (157 lb 3.2 oz)    Examination: General exam: Appears comfortable  HEENT: PERRLA, oral mucosa moist, no sclera icterus or thrush Respiratory system: Clear to auscultation. Respiratory effort normal. Cardiovascular system: S1 & S2 heard, RRR.  No murmurs  Gastrointestinal system: Abdomen soft, non-tender, nondistended. Normal bowel sound. No organomegaly Central nervous system: Alert and oriented. No focal neurological deficits. Extremities: No cyanosis, clubbing or edema GU: foley and prostate drain present- yellowish white drainage in bag Skin: No rashes or ulcers Psychiatry:  Mood & affect appropriate.     Data Reviewed: I have personally reviewed following labs and imaging studies  CBC: Recent Labs  Lab 02/20/17 0551 02/21/17 0739 02/22/17 0719 02/26/17 0402  WBC 7.1 7.1 6.6 5.5  HGB 11.9* 11.7* 12.2* 12.2*  HCT 35.0* 34.7* 35.7* 36.4*  MCV 84.7 84.4 84.2 84.8  PLT 343 311 330 342   Basic Metabolic Panel: Recent Labs  Lab 02/21/17 0739 02/22/17 0719 02/23/17 0607 02/25/17 0652 02/26/17 0402  NA 134* 133* 133* 133* 134*  K 3.9 4.2 4.5 4.4 4.5  CL 109 108 109 107 106  CO2 18* 17* 15* 19* 19*  GLUCOSE 66 195* 113* 166* 83  BUN 27* 24* 22* 25* 19  CREATININE  1.22 1.12 1.00 1.29* 1.22  CALCIUM 7.9* 7.9* 7.9* 7.8* 8.0*  MG  --  1.6*  --   --   --    GFR: Estimated Creatinine Clearance: 63.3 mL/min (by C-G formula based on SCr of 1.22 mg/dL). Liver Function Tests: Recent Labs  Lab 02/22/17 0719  AST 44*  ALT 27  ALKPHOS 146*  BILITOT 0.7  PROT 6.9  ALBUMIN 1.8*   No results for input(s): LIPASE, AMYLASE in the last 168 hours. No results for input(s): AMMONIA in the last 168 hours. Coagulation Profile: No results for input(s): INR, PROTIME in the last 168 hours. Cardiac Enzymes: No  results for input(s): CKTOTAL, CKMB, CKMBINDEX, TROPONINI in the last 168 hours. BNP (last 3 results) No results for input(s): PROBNP in the last 8760 hours. HbA1C: No results for input(s): HGBA1C in the last 72 hours. CBG: Recent Labs  Lab 02/25/17 1227 02/25/17 1704 02/25/17 2156 02/26/17 0803 02/26/17 1225  GLUCAP 186* 124* 95 117* 92   Lipid Profile: No results for input(s): CHOL, HDL, LDLCALC, TRIG, CHOLHDL, LDLDIRECT in the last 72 hours. Thyroid Function Tests: No results for input(s): TSH, T4TOTAL, FREET4, T3FREE, THYROIDAB in the last 72 hours. Anemia Panel: No results for input(s): VITAMINB12, FOLATE, FERRITIN, TIBC, IRON, RETICCTPCT in the last 72 hours. Urine analysis:    Component Value Date/Time   COLORURINE YELLOW 02/18/2017 0311   APPEARANCEUR TURBID (A) 02/18/2017 0311   LABSPEC 1.017 02/18/2017 0311   PHURINE 6.0 02/18/2017 0311   GLUCOSEU >=500 (A) 02/18/2017 0311   HGBUR MODERATE (A) 02/18/2017 0311   BILIRUBINUR NEGATIVE 02/18/2017 0311   KETONESUR NEGATIVE 02/18/2017 0311   PROTEINUR >=300 (A) 02/18/2017 0311   UROBILINOGEN 2.0 (H) 02/20/2014 1339   NITRITE NEGATIVE 02/18/2017 0311   LEUKOCYTESUR LARGE (A) 02/18/2017 0311   Sepsis Labs: @LABRCNTIP (procalcitonin:4,lacticidven:4) ) Recent Results (from the past 240 hour(s))  Blood culture (routine x 2)     Status: None   Collection Time: 02/18/17  4:40 AM  Result  Value Ref Range Status   Specimen Description BLOOD LEFT ARM  Final   Special Requests   Final    BOTTLES DRAWN AEROBIC AND ANAEROBIC Blood Culture adequate volume   Culture NO GROWTH 5 DAYS  Final   Report Status 02/23/2017 FINAL  Final  Blood culture (routine x 2)     Status: None   Collection Time: 02/18/17  5:09 AM  Result Value Ref Range Status   Specimen Description BLOOD RIGHT ARM  Final   Special Requests   Final    BOTTLES DRAWN AEROBIC ONLY Blood Culture adequate volume   Culture NO GROWTH 5 DAYS  Final   Report Status 02/23/2017 FINAL  Final  Aerobic/Anaerobic Culture (surgical/deep wound)     Status: None   Collection Time: 02/18/17  4:13 PM  Result Value Ref Range Status   Specimen Description ABSCESS DRAIN  Final   Special Requests NONE  Final   Gram Stain   Final    ABUNDANT WBC PRESENT, PREDOMINANTLY PMN ABUNDANT GRAM POSITIVE COCCI IN CLUSTERS    Culture   Final    ABUNDANT METHICILLIN RESISTANT STAPHYLOCOCCUS AUREUS NO ANAEROBES ISOLATED; CULTURE IN PROGRESS FOR 5 DAYS    Report Status 02/23/2017 FINAL  Final   Organism ID, Bacteria METHICILLIN RESISTANT STAPHYLOCOCCUS AUREUS  Final      Susceptibility   Methicillin resistant staphylococcus aureus - MIC*    CIPROFLOXACIN <=0.5 SENSITIVE Sensitive     ERYTHROMYCIN >=8 RESISTANT Resistant     GENTAMICIN <=0.5 SENSITIVE Sensitive     OXACILLIN >=4 RESISTANT Resistant     TETRACYCLINE <=1 SENSITIVE Sensitive     VANCOMYCIN 1 SENSITIVE Sensitive     TRIMETH/SULFA <=10 SENSITIVE Sensitive     CLINDAMYCIN <=0.25 SENSITIVE Sensitive     RIFAMPIN <=0.5 SENSITIVE Sensitive     Inducible Clindamycin NEGATIVE Sensitive     * ABUNDANT METHICILLIN RESISTANT STAPHYLOCOCCUS AUREUS  Culture, blood (routine x 2)     Status: None   Collection Time: 02/20/17 12:19 PM  Result Value Ref Range Status   Specimen Description BLOOD CENTRAL LINE  Final   Special Requests IN  PEDIATRIC BOTTLE Blood Culture adequate volume  Final    Culture NO GROWTH 5 DAYS  Final   Report Status 02/25/2017 FINAL  Final  Culture, blood (routine x 2)     Status: None   Collection Time: 02/20/17 12:19 PM  Result Value Ref Range Status   Specimen Description BLOOD A-LINE  Final   Special Requests IN PEDIATRIC BOTTLE Blood Culture adequate volume  Final   Culture NO GROWTH 5 DAYS  Final   Report Status 02/25/2017 FINAL  Final  Surgical pcr screen     Status: Abnormal   Collection Time: 02/24/17 12:11 AM  Result Value Ref Range Status   MRSA, PCR POSITIVE (A) NEGATIVE Final    Comment: RESULT CALLED TO, READ BACK BY AND VERIFIED WITH: T.OLOFIN RN 02/24/17 0337 L.CHAMPION    Staphylococcus aureus POSITIVE (A) NEGATIVE Final    Comment: (NOTE) The Xpert SA Assay (FDA approved for NASAL specimens in patients 62 years of age and older), is one component of a comprehensive surveillance program. It is not intended to diagnose infection nor to guide or monitor treatment.   Aerobic/Anaerobic Culture (surgical/deep wound)     Status: None (Preliminary result)   Collection Time: 02/24/17  1:51 PM  Result Value Ref Range Status   Specimen Description ABSCESS  Final   Special Requests POST CT GUIDED PROSTATIC FLUID  Final   Gram Stain   Final    ABUNDANT WBC PRESENT, PREDOMINANTLY PMN FEW GRAM POSITIVE COCCI IN CLUSTERS    Culture   Final    MODERATE METHICILLIN RESISTANT STAPHYLOCOCCUS AUREUS HOLDING FOR POSSIBLE ANAEROBE    Report Status PENDING  Incomplete   Organism ID, Bacteria METHICILLIN RESISTANT STAPHYLOCOCCUS AUREUS  Final      Susceptibility   Methicillin resistant staphylococcus aureus - MIC*    CIPROFLOXACIN <=0.5 SENSITIVE Sensitive     ERYTHROMYCIN >=8 RESISTANT Resistant     GENTAMICIN <=0.5 SENSITIVE Sensitive     OXACILLIN >=4 RESISTANT Resistant     TETRACYCLINE <=1 SENSITIVE Sensitive     VANCOMYCIN 1 SENSITIVE Sensitive     TRIMETH/SULFA <=10 SENSITIVE Sensitive     CLINDAMYCIN <=0.25 SENSITIVE Sensitive      RIFAMPIN <=0.5 SENSITIVE Sensitive     Inducible Clindamycin NEGATIVE Sensitive     * MODERATE METHICILLIN RESISTANT STAPHYLOCOCCUS AUREUS         Radiology Studies: No results found.    Scheduled Meds: . ARIPiprazole  5 mg Oral Daily  . aspirin  81 mg Oral Daily  . atorvastatin  20 mg Oral q1800  . buPROPion  150 mg Oral Daily  . carvedilol  3.125 mg Oral BID  . digoxin  0.125 mg Oral Daily  . feeding supplement (ENSURE ENLIVE)  237 mL Oral TID BM  . insulin aspart  0-9 Units Subcutaneous TID WC  . insulin glargine  10 Units Subcutaneous Daily  . nystatin  5 mL Oral QID  . rivaroxaban  20 mg Oral Q supper  . sertraline  100 mg Oral QHS  . sodium bicarbonate  650 mg Oral BID  . sodium chloride flush  5 mL Intravenous Q8H  . traZODone  100 mg Oral QHS   Continuous Infusions: . vancomycin Stopped (02/26/17 0000)     LOS: 8 days    Time spent in minutes: 35    Calvert CantorSaima Anetria Harwick, MD [facility] Pager: [email or web address] Password [email or web address] 02/26/2017, 1:57 PM

## 2017-02-26 NOTE — Progress Notes (Signed)
Pt. Refused to get up to chair pt stated "let me finish my nap first" pt made aware the importance of getting gup and out of the bed to the chair. Pt replied after his nap he would get up RN and Nurse tech will attempt to get pt up again .

## 2017-02-26 NOTE — Plan of Care (Signed)
  Progressing Education: Knowledge of [facility] Education information/materials will improve 02/26/2017 2033 - Progressing by Marisa Sprinkles, RN 02/26/2017 2032 - Progressing by Marisa Sprinkles, RN Health Behavior/Discharge Planning: Ability to manage health-related needs will improve 02/26/2017 2033 - Progressing by Marisa Sprinkles, RN 02/26/2017 2032 - Progressing by Marisa Sprinkles, RN Pain Managment: General experience of comfort will improve 02/26/2017 2033 - Progressing by Marisa Sprinkles, RN 02/26/2017 2032 - Progressing by Marisa Sprinkles, RN Physical Regulation: Ability to maintain clinical measurements within normal limits will improve 02/26/2017 2033 - Progressing by Marisa Sprinkles, RN 02/26/2017 2032 - Progressing by Marisa Sprinkles, RN Will remain free from infection 02/26/2017 2033 - Progressing by Marisa Sprinkles, RN 02/26/2017 2032 - Progressing by Marisa Sprinkles, RN Activity: Risk for activity intolerance will decrease 02/26/2017 2033 - Progressing by Marisa Sprinkles, RN 02/26/2017 2032 - Progressing by Marisa Sprinkles, RN Fluid Volume: Ability to maintain a balanced intake and output will improve 02/26/2017 2033 - Progressing by Marisa Sprinkles, RN 02/26/2017 2032 - Progressing by Marisa Sprinkles, RN

## 2017-02-26 NOTE — Plan of Care (Deleted)
Continuing education on current condition including infection cause and treatment

## 2017-02-26 NOTE — Clinical Social Work Note (Signed)
Clinical Social Worker continuing to follow patient for support and discharge planning needs.  Patient plans to return to [facility] at discharge.  Per MD, patient not yet medically stable.  CSW remains available for support and to facilitate patient discharge needs once medically ready.  Macario Golds, [place] [phone]

## 2017-02-26 NOTE — Progress Notes (Signed)
Pharmacy Antibiotic Note  [name] is a 61 y.o. male admitted on 02/17/2017 with prostatic abscess.  Pharmacy has been consulted for vancomycin dosing.  Vanc trough at goal.  Plan: This patient's current antibiotics will be continued without adjustments.  Height: 6' 2.5" (189.2 cm) Weight: 157 lb 3.2 oz (71.3 kg) IBW/kg (Calculated) : 83.35  Temp (24hrs), Avg:97.8 F (36.6 C), Min:97.5 F (36.4 C), Max:98 F (36.7 C)  Recent Labs  Lab 02/20/17 0551 02/21/17 0739 02/22/17 0719 02/23/17 0607 02/23/17 1150 02/25/17 0652 02/26/17 0402 02/26/17 2104  WBC 7.1 7.1 6.6  --   --   --  5.5  --   CREATININE 1.44* 1.22 1.12 1.00  --  1.29* 1.22  --   VANCOTROUGH  --   --   --   --  28*  --   --  20    Estimated Creatinine Clearance: 63.3 mL/min (by C-G formula based on SCr of 1.22 mg/dL).    No Known Allergies   Thank you for allowing pharmacy to be a part of this patient's care.  Vernard Gambles, PharmD, BCPS  02/26/2017 11:23 PM

## 2017-02-27 ENCOUNTER — Encounter (HOSPITAL_COMMUNITY): Payer: Self-pay | Admitting: Interventional Radiology

## 2017-02-27 ENCOUNTER — Inpatient Hospital Stay (HOSPITAL_COMMUNITY): Payer: Medicare Other

## 2017-02-27 DIAGNOSIS — R768 Other specified abnormal immunological findings in serum: Secondary | ICD-10-CM

## 2017-02-27 DIAGNOSIS — E871 Hypo-osmolality and hyponatremia: Secondary | ICD-10-CM

## 2017-02-27 DIAGNOSIS — I428 Other cardiomyopathies: Secondary | ICD-10-CM

## 2017-02-27 DIAGNOSIS — F418 Other specified anxiety disorders: Secondary | ICD-10-CM

## 2017-02-27 DIAGNOSIS — N419 Inflammatory disease of prostate, unspecified: Secondary | ICD-10-CM

## 2017-02-27 HISTORY — PX: IR CATHETER TUBE CHANGE: IMG717

## 2017-02-27 LAB — GLUCOSE, CAPILLARY
GLUCOSE-CAPILLARY: 181 mg/dL — AB (ref 65–99)
GLUCOSE-CAPILLARY: 174 mg/dL — AB (ref 65–99)
GLUCOSE-CAPILLARY: 148 mg/dL — AB (ref 65–99)
Glucose-Capillary: 153 mg/dL — ABNORMAL HIGH (ref 65–99)
Glucose-Capillary: 142 mg/dL — ABNORMAL HIGH (ref 65–99)

## 2017-02-27 MED ORDER — LIDOCAINE HCL 1 % IJ SOLN
INTRAMUSCULAR | Status: DC | PRN
  Administered 2017-02-27: 5 mL

## 2017-02-27 MED ORDER — IOPAMIDOL (ISOVUE-300) INJECTION 61%
INTRAVENOUS | Status: AC
  Filled 2017-02-27: qty 50

## 2017-02-27 MED ORDER — OXYCODONE HCL 5 MG PO TABS: 5.0000 mg | ORAL_TABLET | Freq: Four times a day (QID) | ORAL | 0 refills | Status: DC | PRN

## 2017-02-27 MED ORDER — SULFAMETHOXAZOLE-TRIMETHOPRIM 800-160 MG PO TABS
1.0000 | ORAL_TABLET | Freq: Two times a day (BID) | ORAL | Status: DC
Start: 2017-02-27 — End: 2017-05-13

## 2017-02-27 MED ORDER — INSULIN GLARGINE 100 UNIT/ML ~~LOC~~ SOLN: 10.0000 [IU] | Freq: Every day | SUBCUTANEOUS | 11 refills | Status: AC

## 2017-02-27 MED ORDER — FUROSEMIDE 40 MG PO TABS
40.0000 mg | ORAL_TABLET | Freq: Every day | ORAL | Status: AC | PRN
Start: 2017-02-27 — End: ?

## 2017-02-27 MED ORDER — PRO-STAT SUGAR FREE PO LIQD: 30.0000 mL | Freq: Two times a day (BID) | ORAL | 0 refills | Status: AC

## 2017-02-27 MED ORDER — SODIUM CHLORIDE 0.9 % IV BOLUS (SEPSIS)
500.0000 mL | Freq: Once | INTRAVENOUS | Status: AC
  Administered 2017-02-27: 500 mL via INTRAVENOUS

## 2017-02-27 MED ORDER — SULFAMETHOXAZOLE-TRIMETHOPRIM 800-160 MG PO TABS
1.0000 | ORAL_TABLET | Freq: Two times a day (BID) | ORAL | Status: DC
  Administered 2017-02-28: 1 via ORAL
  Filled 2017-02-27 (×2): qty 1

## 2017-02-27 MED ORDER — SODIUM BICARBONATE 650 MG PO TABS: 650.0000 mg | ORAL_TABLET | Freq: Two times a day (BID) | ORAL | Status: AC

## 2017-02-27 MED ORDER — LIDOCAINE HCL 1 % IJ SOLN
INTRAMUSCULAR | Status: AC
  Filled 2017-02-27: qty 20

## 2017-02-27 MED ORDER — ENSURE ENLIVE PO LIQD: 237.0000 mL | Freq: Three times a day (TID) | ORAL | 12 refills | Status: AC

## 2017-02-27 NOTE — Progress Notes (Signed)
[facility] is now rescinding bed offer due to patient having an "EWriter. They state patient likely needs long term care and they will not be able to get another pasrr. [facility] is able to accept patient on Tuesday (before 4pm).  Osborne Casco Kameryn Tisdel LCSWA [phone]

## 2017-02-27 NOTE — Progress Notes (Signed)
Referring Physician(s): Dr Salena Saner Liliane ShiWinter  Supervising Physician: Malachy MoanMcCullough, Heath  [facility]  Chief Complaint:  Prostate abscess  Subjective:  Drain placed 10/27 Aspiration 11/2 Feels so much better Plan for DC soon - to SNF  Draining 25 cc yesterday   Allergies: Patient has no known allergies.  Medications: Prior to Admission medications   Medication Sig Start Date End Date Taking? Authorizing Provider  acetaminophen (TYLENOL) 325 MG tablet Take 650 mg by mouth every 6 (six) hours as needed for mild pain.   Yes [provider]  ARIPiprazole (ABILIFY) 10 MG tablet Take 5 mg by mouth daily.   Yes [provider]  aspirin 81 MG chewable tablet Chew 1 tablet (81 mg total) by mouth daily. 09/21/16  Yes Graciella Freerillery, Michael Andrew, PA-C  atorvastatin (LIPITOR) 20 MG tablet Take 1 tablet (20 mg total) by mouth daily at 6 PM. 09/21/16  Yes Tillery, Mariam DollarMichael Andrew, PA-C  buPROPion (WELLBUTRIN XL) 150 MG 24 hr tablet Take 1 tablet (150 mg total) by mouth daily. 12/06/16  Yes Nedrud, Jeanella FlatteryMarybeth, MD  carvedilol (COREG) 3.125 MG tablet Take 1 tablet (3.125 mg total) by mouth 2 (two) times daily. 12/06/16 12/06/17 Yes Valentino NoseBoswell, Nathan, MD  digoxin (LANOXIN) 0.125 MG tablet Take 1 tablet (0.125 mg total) by mouth daily. 09/21/16  Yes Graciella Freerillery, Michael Andrew, PA-C  furosemide (LASIX) 40 MG tablet Take 40 mg by mouth 2 (two) times daily.   Yes [provider]  hydrALAZINE (APRESOLINE) 25 MG tablet Take 1 tablet (25 mg total) by mouth 3 (three) times daily. 09/21/16  Yes Graciella Freerillery, Michael Andrew, PA-C  insulin glargine (LANTUS) 100 UNIT/ML injection Inject 20 units into skin each night. Check BG daily. If CBG>300 can take 40 units. Patient taking differently: Inject 20-40 Units into the skin daily as needed (high blood sugar).  12/07/16  Yes NedrudJeanella Flattery, Marybeth, MD  isosorbide mononitrate (IMDUR) 30 MG 24 hr tablet Take 1 tablet (30 mg total) by mouth daily. 09/21/16  Yes  Graciella Freerillery, Michael Andrew, PA-C  metFORMIN (GLUCOPHAGE) 1000 MG tablet Take 1 tablet (1,000 mg total) by mouth 2 (two) times daily with a meal. For high blood sugar control 08/13/16  Yes Maxie BarbBhandari, Dron Prasad, MD  potassium chloride SA (K-DUR,KLOR-CON) 20 MEQ tablet Take 2 tablets (40 mEq total) by mouth daily. 12/06/16  Yes NedrudJeanella Flattery, Marybeth, MD  rivaroxaban (XARELTO) 20 MG TABS tablet Take 1 tablet (20 mg total) by mouth daily with supper. 12/26/16  Yes Nedrud, Jeanella FlatteryMarybeth, MD  sacubitril-valsartan (ENTRESTO) 24-26 MG Take 1 tablet by mouth 2 (two) times daily. 10/19/16  Yes Laurey MoraleMcLean, Dalton S, MD  sertraline (ZOLOFT) 100 MG tablet Take 100 mg by mouth at bedtime.    Yes [provider]  traZODone (DESYREL) 100 MG tablet Take 1 tablet (100 mg total) by mouth at bedtime. For sleep 12/27/12  Yes Armandina StammerNwoko, Agnes I, NP     Vital Signs: BP 122/71 (BP Location: Right Arm)   Pulse 69   Temp 98.1 F (36.7 C) (Oral)   Resp 18   Ht 6' 2.5" (1.892 m)   Wt 157 lb 9.6 oz (71.5 kg)   SpO2 97%   BMI 19.96 kg/m   Physical Exam  Constitutional: He is oriented to person, place, and time.  Abdominal: Soft.  Musculoskeletal: Normal range of motion.  Neurological: He is alert and oriented to person, place, and time.  Skin: Skin is warm and dry.  Site is clean and dry NT No  bleeding OP pus like---purulent yellow  Tubing has come away from hub Must be exchanged asap IR aware  Psychiatric: He has a normal mood and affect.  Nursing note and vitals reviewed.   Imaging: Ct Aspiration  Result Date: 02/24/2017 INDICATION: Prostatic abscess, post percutaneous drainage catheter placement on 02/18/2017. Subsequent pelvic CT performed 02/20/2017 demonstrated a persistent peripherally enhancing collection within the left side of the prostate. As such, request made for an additional CT-guided aspiration/drainage catheter placement. EXAM: CT-GUIDED ASPIRATION OF THE LEFT SIDE OF THE PROSTATE COMPARISON:  Pelvic CT -  02/20/2017; CT-guided prostate abscess drainage catheter placement -02/18/2017; CT abdomen and pelvis -02/18/2017 MEDICATIONS: The patient is currently admitted to the hospital and receiving intravenous antibiotics. The antibiotics were administered within an appropriate time frame prior to the initiation of the procedure. ANESTHESIA/SEDATION: Moderate (conscious) sedation was employed during this procedure. A total of Versed 1.5 mg and Fentanyl 100 mcg was administered intravenously. Moderate Sedation Time: 10 minutes. The patient's level of consciousness and vital signs were monitored continuously by radiology nursing throughout the procedure under my direct supervision. CONTRAST:  None COMPLICATIONS: None immediate. PROCEDURE: Informed written consent was obtained from the patient after a discussion of the risks, benefits and alternatives to treatment. The patient was placed prone on the CT gantry and a pre procedural CT was performed re-demonstrating the known ill-defined fluid collection within the left side of the prostate with dominant ill-defined component measuring at least 3.8 x 3.8 cm (image 34, series 2). The procedure was planned. A timeout was performed prior to the initiation of the procedure. The skin overlying the left lower medial buttocks was prepped and draped in the usual sterile fashion. The overlying soft tissues were anesthetized with 1% lidocaine with epinephrine. Appropriate trajectory was planned with the use of a 22 gauge spinal needle. An 18 gauge trocar needle was advanced into the fluid collection. Appropriate position was confirmed, however only a scant amounts of dark bloody fluid was aspirated. Next, a short Amplatz super stiff wire was coiled within the prostate. Appropriate positioning was confirmed with a limited CT scan. The trocar needle was exchanged for a 15 cm Yueh sheath catheter which was advanced over the guidewire. A total of approximately 3 cc of dark red non foul  smelling blood was aspirated as the catheter was slowly withdrawn. The procedure was terminated. Superficial hemostasis was achieved with manual compression. A dressing was placed. The patient tolerated the procedure well without immediate post procedural complication. IMPRESSION: Technically successful CT-guided aspiration of approximately 3 cc of bloody fluid from the left side of the prostate gland. Given lack of frank purulence, a percutaneous drainage catheter was not placed at this time. All aspirated fluid was capped and sent to the laboratory for analysis. Electronically Signed   By: Simonne Come M.D.   On: 02/24/2017 16:08    Labs:  CBC: Recent Labs    02/20/17 0551 02/21/17 0739 02/22/17 0719 02/26/17 0402  WBC 7.1 7.1 6.6 5.5  HGB 11.9* 11.7* 12.2* 12.2*  HCT 35.0* 34.7* 35.7* 36.4*  PLT 343 311 330 342    COAGS: No results for input(s): INR, APTT in the last 8760 hours.  BMP: Recent Labs    02/22/17 0719 02/23/17 0607 02/25/17 0652 02/26/17 0402  NA 133* 133* 133* 134*  K 4.2 4.5 4.4 4.5  CL 108 109 107 106  CO2 17* 15* 19* 19*  GLUCOSE 195* 113* 166* 83  BUN 24* 22* 25* 19  CALCIUM 7.9* 7.9*  7.8* 8.0*  CREATININE 1.12 1.00 1.29* 1.22  GFRNONAA >60 >60 58* >60  GFRAA >60 >60 >60 >60    LIVER FUNCTION TESTS: Recent Labs    12/05/16 0226 02/17/17 2148 02/19/17 0353 02/22/17 0719  BILITOT 1.5* 0.8 1.0 0.7  AST 33 21 19 44*  ALT 23 14* 13* 27  ALKPHOS 87 126 93 146*  PROT 6.4* 8.3* 6.3* 6.9  ALBUMIN 2.3* 2.2* 1.7* 1.8*    Assessment and Plan:  Prostate abscess Better daily OP still creamy yellow- purulent 25 cc yesterday Plan for IR OP drain clinic visit 7-10 days Orders in for same (needs drain exchange prior to DC---tubing has come away from hub) I put order in for exchange  Electronically Signed: Jeffree Cazeau A, PA-C 02/27/2017, 11:09 AM   I spent a total of 15 Minutes at the the patient's bedside AND on the patient's hospital floor or  unit, greater than 50% of which was counseling/coordinating care for prostate abscess drain

## 2017-02-27 NOTE — Progress Notes (Signed)
Physical Therapy Treatment Patient Details Name: [name] MRN: [id number] DOB: [birth year] Today's Date: 02/27/2017    History of Present Illness 61 y.o.male,w hypertension, dm2, cad, CHF 10-15%) apparently c/o burning w urination starting 2 days ago. Pt has had diarrhea for the past 4 days. Pt notes some generalized abdominal discomfort. Pt denies fever, chills, n/v, constipation, brbpr. Pt presented to ED due to diarrhea, and dysuria. + Prostate abscess s/p IR drain placement 10/27.    PT Comments    Pt is making slow progress towards his goals. Pt no longer c/o dizziness and nausea with movement and is able to perform bed mobility with min guard and transfers and ambulation of 2 feet with minA. Pt requires skilled PT in the acute setting to progress mobility and improve strength and endurance to safely navigate their discharge environment.     Follow Up Recommendations  SNF;Supervision/Assistance - 24 hour     Equipment Recommendations  Rolling walker with 5" wheels    Recommendations for Other Services       Precautions / Restrictions Precautions Precautions: Fall Restrictions Weight Bearing Restrictions: No    Mobility  Bed Mobility Overal bed mobility: Needs Assistance Bed Mobility: Supine to Sit;Sit to Supine     Supine to sit: Min guard;HOB elevated     General bed mobility comments: min guard for safety, pt with heavy reliance on bedrail to pull to EoB  Transfers Overall transfer level: Needs assistance Equipment used: 1 person hand held assist Transfers: Sit to/from Stand Sit to Stand: Min guard;Min assist         General transfer comment: hands on min guard for safety, good power up, however required minA for controlling descent to chair lacking eccentric control, vc for hand placement and slowed descent.  Ambulation/Gait Ambulation/Gait assistance: Min guard(Moderate assist with BLE buckling when mobilizing to bathroo)   Assistive  device: 1 person hand held assist Gait Pattern/deviations: Step-to pattern;Decreased stride length;Shuffle;Trunk flexed Gait velocity: decreased Gait velocity interpretation: Below normal speed for age/gender General Gait Details: pt only agreed to take 5 steps over to recliner and sit down. hands on min guard for safety no LoB no buckling          Balance Overall balance assessment: Needs assistance Sitting-balance support: Feet supported Sitting balance-Leahy Scale: Fair     Standing balance support: During functional activity Standing balance-Leahy Scale: Fair Standing balance comment: able to statically stand with hands on min guard                             Cognition Arousal/Alertness: Awake/alert Behavior During Therapy: Flat affect;Impulsive Overall Cognitive Status: No family/caregiver present to determine baseline cognitive functioning                                           General Comments General comments (skin integrity, edema, etc.): VSS throughout, no dizziness or nausea with movement      Pertinent Vitals/Pain Pain Assessment: Faces Faces Pain Scale: Hurts a little bit Pain Location: generalized Pain Descriptors / Indicators: Discomfort;Grimacing Pain Intervention(s): Monitored during session;Limited activity within patient's tolerance    Home Living Family/patient expects to be discharged to:: Skilled nursing facility Living Arrangements: Non-relatives/Friends Available Help at Discharge: Available PRN/intermittently;Friend(s) Type of Home: House Home Access: Stairs to enter     Home Equipment: Gilmer Morane -  single point;Walker - 2 wheels Additional Comments: patient distracted by pain and urgency for BM during attempts to obtain history    Prior Function Level of Independence: Needs assistance  Gait / Transfers Assistance Needed: uses a cane at home, has had difficulty walking recently due to pain       PT Goals  (current goals can now be found in the care plan section) Acute Rehab PT Goals Patient Stated Goal: to not be in pain PT Goal Formulation: With patient Time For Goal Achievement: 03/07/17 Potential to Achieve Goals: Fair Progress towards PT goals: Progressing toward goals    Frequency    Min 2X/week      PT Plan Current plan remains appropriate       AM-PAC PT "6 Clicks" Daily Activity  Outcome Measure  Difficulty turning over in bed (including adjusting bedclothes, sheets and blankets)?: A Lot Difficulty moving from lying on back to sitting on the side of the bed? : Unable Difficulty sitting down on and standing up from a chair with arms (e.g., wheelchair, bedside commode, etc,.)?: Unable Help needed moving to and from a bed to chair (including a wheelchair)?: A Little Help needed walking in hospital room?: A Little Help needed climbing 3-5 steps with a railing? : Total 6 Click Score: 11    End of Session Equipment Utilized During Treatment: Gait belt Activity Tolerance: Patient limited by fatigue Patient left: with call bell/phone within reach;in chair;with chair alarm set;with nursing/sitter in room Nurse Communication: Mobility status PT Visit Diagnosis: Unsteadiness on feet (R26.81);Difficulty in walking, not elsewhere classified (R26.2)     Time: 2595-6387 PT Time Calculation (min) (ACUTE ONLY): 14 min  Charges:  $Therapeutic Activity: 8-22 mins                    G Codes:       [name] PT, DPT Acute Rehabilitation  [phone] Pager [phone]     [name] 02/27/2017, 4:05 PM

## 2017-02-27 NOTE — Discharge Summary (Addendum)
Physician Discharge Summary  [name] JXB:[id number] DOB: [birth year] DOA: 02/17/2017  PCP: [facility]  Admit date: 02/17/2017 Discharge date: 02/27/2017  Admitted From: homeless Disposition:  SNF  Recommendations for Outpatient Follow-up:  1. F/u in drain clinic- drain changed by IR on 11/5 2. F/u with urology 3. Daily weights- modify diuretics based on these 4. Bmet in 3 days to f/u renal funtion, electrolytes and acidosis 5. Stop Bactrim on 11/25 6. F/u for recurrence of thrush   Discharge Condition:  stable   CODE STATUS:  Full code   Consultations:  IR  ID    Discharge Diagnoses:  Principal Problem:   MRSA Prostate abscess Active Problems:   AKI (acute kidney injury)  Hypotension    Hyponatremia Thrush   Hypertensive heart disease with chronic systolic congestive heart failure (HCC)   Type 2 diabetes mellitus with complication (HCC)   Depression with anxiety   Chronic systolic CHF (congestive heart failure) (HCC)   Pulmonary embolus (HCC)   Cardiomyopathy, nonischemic (HCC)   HCV antibody positive  h/o non-compliance with medications   Subjective: No new complaints today. Pain in groin is minimal. Oral intake is poor.   Brief Summary: 61 yo M with non-ischemic cardiomyopathy, EF 10-15%, HTN, IDDM, PE, cocaine abuse, HCV +, poor compliance with medications who is mostly homeless. He presents with few days dysuria, found to have sepsis related prostatic abscess    Hospital Course:  Hospital Course:  MRSA Prostate abscess - initially an aspiration and then a drain which was done - 10/27- CT pelvis shows 5cm abscess in left lobe of prostate - wound cultures growing MRSA x 2   -Continue vancomyin - appreciate ID follow up - Urology recommends 3 wks of antibiotics, cont foley 7-10 days and f/u in office for voiding trial-see below -  11/2- He had had further aspiration of 3 cc of bloody fluid from left prostate - culture shows MRSA again -  MRSA sensitive to Cipro, Tetracycline, Bactrim, Clinda- ID recommending 3 more weeks of Bactrim - blood cultures negative - small amount of drainage of about 25 cc in drain- IR would like to maintain the drain and follow him up in the drain clinic - foley has been removed and he has voided - very minimal amount of post void residual noted (80cc)   Thrush - 20/31- started Nystatin- Thrush has resolved   Mild AKI with acidosis - Cr Improved with fluids which are now on hold, still mildly acidotic on Bmet but appears to be compensating on ABG - pCO2 26.1 -  Furosemide and Entresto are still on hold -   I/Os are inaccurate -  daily weights - 72 kg >> 70.9 >> 72>>70 - 11/2 started Bicarb tabs- acidosis slightly better but still remains at 19 - Cr has nomalized  Hyponatremia Sodium 128 Improved to baseline of ~ 133  Chronic systolic CHF- V tach - EF 10-15%, not candidate for Adv therapies per HF clinic notes. - due to hypotension, AKI and poor oral intake, we have been holding furosemide and Entresto and hypertensives -Continued on statin, Dig- slowly resumed Coreg, Hydralazine and Imdur - Lasix can be used PRN based on daily weights while at SNF     Hypotensive with h/o HTN - required IV boluses - see above  IDDM - Glargine was being given at 10 u rather than 20 U that he takes at home- AM sugar was 65 on 10/30-  sugars subsequently too high & increased Lantus  to 16 U - subsequently sugars dropped to 60s- currently back to 10 u of Lantus -SSI with meals - sugars have been stable  Recent PE - CTA 8/12 >> There are small bilateral lower lobe subsegmental pulmonary thromboemboli. -Continue Xarelto- also on Aspirin which I will stop  Other medications -Continue Abilify, Wllbutrin, trazodone, sertraline  Alcohol use - no signs of withdrawal  Underweight/ severe protein calorie malnutrition/ hypoalbuminemia Body mass index is 19.91 kg/m.  -Ensure-  Prostat   Discharge Instructions  Discharge Instructions    Diet - low sodium heart healthy   Complete by:  As directed    Increase activity slowly   Complete by:  As directed      Allergies as of 02/27/2017   No Known Allergies     Medication List    STOP taking these medications   aspirin 81 MG chewable tablet   potassium chloride SA 20 MEQ tablet Commonly known as:  K-DUR,KLOR-CON   sacubitril-valsartan 24-26 MG Commonly known as:  ENTRESTO     TAKE these medications   acetaminophen 325 MG tablet Commonly known as:  TYLENOL Take 650 mg by mouth every 6 (six) hours as needed for mild pain.   ARIPiprazole 10 MG tablet Commonly known as:  ABILIFY Take 5 mg by mouth daily.   atorvastatin 20 MG tablet Commonly known as:  LIPITOR Take 1 tablet (20 mg total) by mouth daily at 6 PM.   buPROPion 150 MG 24 hr tablet Commonly known as:  WELLBUTRIN XL Take 1 tablet (150 mg total) by mouth daily.   carvedilol 3.125 MG tablet Commonly known as:  COREG Take 1 tablet (3.125 mg total) by mouth 2 (two) times daily.   digoxin 0.125 MG tablet Commonly known as:  LANOXIN Take 1 tablet (0.125 mg total) by mouth daily.   feeding supplement (ENSURE ENLIVE) Liqd Take 237 mLs 3 (three) times daily between meals by mouth.   feeding supplement (PRO-STAT SUGAR FREE 64) Liqd Take 30 mLs 2 (two) times daily by mouth.   furosemide 40 MG tablet Commonly known as:  LASIX Take 1 tablet (40 mg total) daily as needed by mouth. For weight gain of > 3 lb in 24-48 hr period What changed:    when to take this  reasons to take this  additional instructions   hydrALAZINE 25 MG tablet Commonly known as:  APRESOLINE Take 1 tablet (25 mg total) by mouth 3 (three) times daily.   insulin glargine 100 UNIT/ML injection Commonly known as:  LANTUS Inject 0.1 mLs (10 Units total) daily into the skin. Start taking on:  02/28/2017 What changed:    how much to take  how to take  this  when to take this  additional instructions   isosorbide mononitrate 30 MG 24 hr tablet Commonly known as:  IMDUR Take 1 tablet (30 mg total) by mouth daily.   metFORMIN 1000 MG tablet Commonly known as:  GLUCOPHAGE Take 1 tablet (1,000 mg total) by mouth 2 (two) times daily with a meal. For high blood sugar control   oxyCODONE 5 MG immediate release tablet Commonly known as:  Oxy IR/ROXICODONE Take 1 tablet (5 mg total) every 6 (six) hours as needed by mouth for moderate pain.   rivaroxaban 20 MG Tabs tablet Commonly known as:  XARELTO Take 1 tablet (20 mg total) by mouth daily with supper.   sertraline 100 MG tablet Commonly known as:  ZOLOFT Take 100 mg by mouth at bedtime.  sodium bicarbonate 650 MG tablet Take 1 tablet (650 mg total) 2 (two) times daily by mouth.   sulfamethoxazole-trimethoprim 800-160 MG tablet Commonly known as:  BACTRIM DS,SEPTRA DS Take 1 tablet every 12 (twelve) hours by mouth.   traZODone 100 MG tablet Commonly known as:  DESYREL Take 1 tablet (100 mg total) by mouth at bedtime. For sleep      Follow-up Information    Rene Paci, MD Follow up.   Specialty:  Urology Contact information: [address] [place] [address] [phone]        Oley Balm, MD Follow up in 10 day(s).   Specialties:  Interventional Radiology, Radiology Why:  follow up prostate drain ; pt/facility to hear from scheduler for appt time and date Contact information: [address] [place] [address] [phone]        [facility]. Call.   Specialty:  Infectious Diseases Why:  Follow up visit in 3 weeks with either Dr. Luciana Axe or Judeth Cornfield, NP. Please call if you do not hear from our team 1 week after discharge.  Contact information: [address] [place], [address] [place] [id number] mc [place] [address] [phone]         No Known  Allergies   Procedures/Studies:    Ct Pelvis W Contrast  Result Date: 02/20/2017 CLINICAL DATA:  Status post percutaneous drainage of prostate abscess. EXAM: CT PELVIS WITH CONTRAST TECHNIQUE: Multidetector CT imaging of the pelvis was performed using the standard protocol following the bolus administration of intravenous contrast. CONTRAST:  ISOVUE-300 IOPAMIDOL (ISOVUE-300) INJECTION 61% COMPARISON:  02/18/2017 FINDINGS: Urinary Tract: Foley catheter decompresses the urinary bladder. Despite the decompressed state, bladder wall appears circumferentially thickened. Gas in the bladder lumen is compatible with the presence of the catheter. Bowel:  Unremarkable. Vascular/Lymphatic: Atherosclerotic calcification noted distal aorta and iliac arteries. No pelvic sidewall lymphadenopathy. Reproductive: Prostate gland is enlarged and ill-defined. Right parasacral percutaneous drainage catheter is noted with the distal loop formed in the right aspect of the prostate gland. The right-sided intra prostatic fluid collection is markedly decompressed in the interval. There is persistence of the rim enhancing fluid collection in the left prostate gland extending posteriorly towards the rectum. This component measures 5.3 x 3.1 cm today, similar to 5.0 x 3.5 cm on the study from 2 days ago. Other: Edema is noted in the soft tissues of the pelvic for and body wall. Musculoskeletal: Bone windows reveal no worrisome lytic or sclerotic osseous lesions. IMPRESSION: 1. Interval decompression of the right component of the complex prostatic abscess. The rim enhancing fluid collection on the left extending posteriorly towards the rectum appears similar in size to the study from 2 days ago suggesting that it may not be drained by the existing catheter. Electronically Signed   By: Kennith Center M.D.   On: 02/20/2017 16:14   Ct Abdomen Pelvis W Contrast  Result Date: 02/18/2017 CLINICAL DATA:  Unspecified abdominal pain  EXAM: CT ABDOMEN AND PELVIS WITH CONTRAST TECHNIQUE: Multidetector CT imaging of the abdomen and pelvis was performed using the standard protocol following bolus administration of intravenous contrast. CONTRAST:  15mL ISOVUE-300 IOPAMIDOL (ISOVUE-300) INJECTION 61% COMPARISON:  CT from yesterday FINDINGS: Lower chest: Moderate right and small left pleural effusion. Patient has history of CHF. Mild atelectasis. Hepatobiliary: Hepatic steatosis. No mass lesion is seen.No evidence of gallstone. Mild fluid density around the gallbladder which is likely secondary. No over distention or gallbladder wall  thickening. Pancreas: Unremarkable. Spleen: Unremarkable. Adrenals/Urinary Tract: Negative adrenals. No hydronephrosis or stone. Marked circumferential bladder wall thickening. The bladder is decompressed by Foley catheter. Stomach/Bowel: Proctitis with circumferential wall thickening. Mid and distal appendiceal wall thickening to 13 mm. No discrete surrounding fat stranding. Vascular/Lymphatic: No acute vascular abnormality. Diffuse atherosclerotic calcification of the aorta and its branches. No mass or adenopathy. Reproductive:There is multi septated fluid collection in the enlarged and ill-defined prostate, in total measuring up to 6.4 cm. The abscess bulges left posterior into the recto prostatic fat plane. Other: No ascites or pneumoperitoneum. Musculoskeletal: Degenerative changes. No acute or aggressive finding. IMPRESSION: 1. Cystitis and prostatitis with multi septated prostatic abscess measuring up to 6.4 cm. The abscess is tracking towards the inflamed rectum without signs of fistulization. 2. Nonprogressive appendicitis and proctitis. 3. Pleural effusions that are small on the left and moderate on the right. Electronically Signed   By: Marnee Spring M.D.   On: 02/18/2017 14:26   Ct Aspiration  Result Date: 02/24/2017 INDICATION: Prostatic abscess, post percutaneous drainage catheter placement on  02/18/2017. Subsequent pelvic CT performed 02/20/2017 demonstrated a persistent peripherally enhancing collection within the left side of the prostate. As such, request made for an additional CT-guided aspiration/drainage catheter placement. EXAM: CT-GUIDED ASPIRATION OF THE LEFT SIDE OF THE PROSTATE COMPARISON:  Pelvic CT - 02/20/2017; CT-guided prostate abscess drainage catheter placement -02/18/2017; CT abdomen and pelvis -02/18/2017 MEDICATIONS: The patient is currently admitted to the hospital and receiving intravenous antibiotics. The antibiotics were administered within an appropriate time frame prior to the initiation of the procedure. ANESTHESIA/SEDATION: Moderate (conscious) sedation was employed during this procedure. A total of Versed 1.5 mg and Fentanyl 100 mcg was administered intravenously. Moderate Sedation Time: 10 minutes. The patient's level of consciousness and vital signs were monitored continuously by radiology nursing throughout the procedure under my direct supervision. CONTRAST:  None COMPLICATIONS: None immediate. PROCEDURE: Informed written consent was obtained from the patient after a discussion of the risks, benefits and alternatives to treatment. The patient was placed prone on the CT gantry and a pre procedural CT was performed re-demonstrating the known ill-defined fluid collection within the left side of the prostate with dominant ill-defined component measuring at least 3.8 x 3.8 cm (image 34, series 2). The procedure was planned. A timeout was performed prior to the initiation of the procedure. The skin overlying the left lower medial buttocks was prepped and draped in the usual sterile fashion. The overlying soft tissues were anesthetized with 1% lidocaine with epinephrine. Appropriate trajectory was planned with the use of a 22 gauge spinal needle. An 18 gauge trocar needle was advanced into the fluid collection. Appropriate position was confirmed, however only a scant amounts of  dark bloody fluid was aspirated. Next, a short Amplatz super stiff wire was coiled within the prostate. Appropriate positioning was confirmed with a limited CT scan. The trocar needle was exchanged for a 15 cm Yueh sheath catheter which was advanced over the guidewire. A total of approximately 3 cc of dark red non foul smelling blood was aspirated as the catheter was slowly withdrawn. The procedure was terminated. Superficial hemostasis was achieved with manual compression. A dressing was placed. The patient tolerated the procedure well without immediate post procedural complication. IMPRESSION: Technically successful CT-guided aspiration of approximately 3 cc of bloody fluid from the left side of the prostate gland. Given lack of frank purulence, a percutaneous drainage catheter was not placed at this time. All aspirated fluid was capped and sent  to the laboratory for analysis. Electronically Signed   By: Simonne Come M.D.   On: 02/24/2017 16:08   Ct Renal Stone Study  Result Date: 02/18/2017 CLINICAL DATA:  Acute onset of generalized weakness and loss of appetite. Incontinence for 5 days. Initial encounter. EXAM: CT ABDOMEN AND PELVIS WITHOUT CONTRAST TECHNIQUE: Multidetector CT imaging of the abdomen and pelvis was performed following the standard protocol without IV contrast. COMPARISON:  Abdominal ultrasound performed 08/08/2016, and CT of the abdomen and pelvis performed 10/23/2008 FINDINGS: Lower chest: Small bilateral pleural effusions are noted. Mild hazy bibasilar opacities may reflect interstitial edema. The visualized portions of the mediastinum are unremarkable. Hepatobiliary: The liver is unremarkable in appearance. The gallbladder is unremarkable in appearance. The common bile duct remains normal in caliber. Pancreas: The pancreas is within normal limits. Spleen: The spleen is unremarkable in appearance. Adrenals/Urinary Tract: The adrenal glands are unremarkable in appearance. There is wall  thickening along the left renal pelvis, with minimal left-sided hydronephrosis. No distal obstructing stone is seen. This is thought to reflect left-sided ureteritis. Mild nonspecific perinephric stranding is noted bilaterally. The right kidney is unremarkable in appearance. No renal or ureteral stones are identified. Stomach/Bowel: The appendix is distended to 1.2 cm in maximal diameter, with mild surrounding inflammation. Acute appendicitis cannot be excluded, though this may simply be reactive given the pelvic process. Wall thickening is noted along the ascending colon, concerning for an acute infectious or inflammatory process. Wall thickening is also noted at the rectum, with diffuse presacral stranding, concerning for proctitis. The small bowel is grossly unremarkable. The stomach is decompressed unremarkable in appearance. Vascular/Lymphatic: Scattered calcification is seen along the abdominal aorta and its branches. The abdominal aorta is otherwise grossly unremarkable. The inferior vena cava is grossly unremarkable. No retroperitoneal lymphadenopathy is seen. No pelvic sidewall lymphadenopathy is identified. Reproductive: There is diffuse bladder wall thickening, with surrounding soft tissue inflammation, concerning for cystitis. Note is made of an apparent large abscess occupying the prostate, measuring 6.0 x 5.4 x 4.2 cm. This difficult to fully assess without contrast. Surrounding soft tissue inflammation is noted. Other: No additional soft tissue abnormalities are seen. Musculoskeletal: No acute osseous abnormalities are identified. The visualized musculature is unremarkable in appearance. IMPRESSION: 1. Apparent large abscess occupying the prostate, measuring 6.0 x 5.4 x 4.2 cm. Prostatic abscesses are rare, typically a complication of acute bacterial prostatitis. Surrounding soft tissue inflammation noted. 2. Bladder wall thickening, with surrounding soft tissue inflammation, concerning for cystitis.  3. Wall thickening at the rectum, with diffuse presacral stranding, concerning for associated proctitis. 4. Wall thickening along the ascending colon, concerning for an acute infectious or inflammatory process. 5. Distention of the appendix to 1.2 cm in maximal diameter, with mild surrounding inflammation. Acute appendicitis cannot be excluded, though this may simply reflect the underlying pelvic infection. 6. Wall thickening along the left renal pelvis, with minimal left-sided hydronephrosis, thought to reflect left-sided ureteritis. No obstructing stone seen. 7. Small bilateral pleural effusions. Mild hazy bibasilar airspace opacities may reflect minimal interstitial edema. These results were called by telephone at the time of interpretation on 02/18/2017 at 4:45 am to Dr. Cy Blamer, who verbally acknowledged these results. Electronically Signed   By: Roanna Raider M.D.   On: 02/18/2017 04:47   Ct Image Guided Drainage By Percutaneous Catheter  Result Date: 02/18/2017 CLINICAL DATA:  Multiloculated prostatic abscess. Drainage requested. EXAM: CT GUIDED DRAINAGE OF PROSTATIC ABSCESS ANESTHESIA/SEDATION: Intravenous Fentanyl and Versed were administered as conscious sedation during continuous  monitoring of the patient's level of consciousness and physiological / cardiorespiratory status by the radiology RN, with a total moderate sedation time of 18 minutes. PROCEDURE: The procedure, risks, benefits, and alternatives were explained to the patient. Questions regarding the procedure were encouraged and answered. The patient understands and consents to the procedure. Patient placed prone. Select axial scans through the pelvis obtained. The prostatic collection was localized an appropriate skin entry site was determined and marked. The operative field was prepped with chlorhexidinein a sterile fashion, and a sterile drape was applied covering the operative field. A sterile gown and sterile gloves were used for  the procedure. Local anesthesia was provided with 1% Lidocaine. Under CT fluoroscopic guidance, 18 gauge trocar needle advanced into the right posterolateral component of the collection. Purulent material could be aspirated. An Amplatz guidewire advanced easily within the collection, position confirmed on CT. Tract dilated to facilitate placement of a 10 French pigtail drain catheter, formed centrally within the right component of the collection. Catheter secured externally with 0 Prolene suture and StatLock, and placed to external gravity drain bag. A sample of the green peroneal aspirate was sent for Gram stain and culture. The patient tolerated the procedure well. COMPLICATIONS: None immediate FINDINGS: Multiloculated bilateral prostatic abscess was localized. Ten French pigtail drain catheter placed as above. Sample of the aspirate sent for Gram stain and culture. IMPRESSION: 1. Technically successful CT-guided prostatic abscess drain catheter placement. Electronically Signed   By: Corlis Leak M.D.   On: 02/18/2017 16:35       Discharge Exam: Vitals:   02/27/17 0651 02/27/17 1541  BP: 122/71 126/74  Pulse: 69 66  Resp: 18   Temp: 98.1 F (36.7 C) (!) 97.5 F (36.4 C)  SpO2: 97% 98%   Vitals:   02/26/17 1530 02/26/17 2211 02/27/17 0651 02/27/17 1541  BP: 122/69 124/79 122/71 126/74  Pulse:  73 69 66  Resp:  18 18   Temp:  98 F (36.7 C) 98.1 F (36.7 C) (!) 97.5 F (36.4 C)  TempSrc:  Oral Oral Oral  SpO2:  97% 97% 98%  Weight:   71.5 kg (157 lb 9.6 oz)   Height:        General: Pt is alert, awake, not in acute distress Cardiovascular: RRR, S1/S2 +, no rubs, no gallops Respiratory: CTA bilaterally, no wheezing, no rhonchi Abdominal: Soft, NT, ND, bowel sounds + Extremities: no edema, no cyanosis    The results of significant diagnostics from this hospitalization (including imaging, microbiology, ancillary and laboratory) are listed below for reference.      Microbiology: Recent Results (from the past 240 hour(s))  Blood culture (routine x 2)     Status: None   Collection Time: 02/18/17  4:40 AM  Result Value Ref Range Status   Specimen Description BLOOD LEFT ARM  Final   Special Requests   Final    BOTTLES DRAWN AEROBIC AND ANAEROBIC Blood Culture adequate volume   Culture NO GROWTH 5 DAYS  Final   Report Status 02/23/2017 FINAL  Final  Blood culture (routine x 2)     Status: None   Collection Time: 02/18/17  5:09 AM  Result Value Ref Range Status   Specimen Description BLOOD RIGHT ARM  Final   Special Requests   Final    BOTTLES DRAWN AEROBIC ONLY Blood Culture adequate volume   Culture NO GROWTH 5 DAYS  Final   Report Status 02/23/2017 FINAL  Final  Aerobic/Anaerobic Culture (surgical/deep wound)  Status: None   Collection Time: 02/18/17  4:13 PM  Result Value Ref Range Status   Specimen Description ABSCESS DRAIN  Final   Special Requests NONE  Final   Gram Stain   Final    ABUNDANT WBC PRESENT, PREDOMINANTLY PMN ABUNDANT GRAM POSITIVE COCCI IN CLUSTERS    Culture   Final    ABUNDANT METHICILLIN RESISTANT STAPHYLOCOCCUS AUREUS NO ANAEROBES ISOLATED; CULTURE IN PROGRESS FOR 5 DAYS    Report Status 02/23/2017 FINAL  Final   Organism ID, Bacteria METHICILLIN RESISTANT STAPHYLOCOCCUS AUREUS  Final      Susceptibility   Methicillin resistant staphylococcus aureus - MIC*    CIPROFLOXACIN <=0.5 SENSITIVE Sensitive     ERYTHROMYCIN >=8 RESISTANT Resistant     GENTAMICIN <=0.5 SENSITIVE Sensitive     OXACILLIN >=4 RESISTANT Resistant     TETRACYCLINE <=1 SENSITIVE Sensitive     VANCOMYCIN 1 SENSITIVE Sensitive     TRIMETH/SULFA <=10 SENSITIVE Sensitive     CLINDAMYCIN <=0.25 SENSITIVE Sensitive     RIFAMPIN <=0.5 SENSITIVE Sensitive     Inducible Clindamycin NEGATIVE Sensitive     * ABUNDANT METHICILLIN RESISTANT STAPHYLOCOCCUS AUREUS  Culture, blood (routine x 2)     Status: None   Collection Time: 02/20/17 12:19 PM   Result Value Ref Range Status   Specimen Description BLOOD CENTRAL LINE  Final   Special Requests IN PEDIATRIC BOTTLE Blood Culture adequate volume  Final   Culture NO GROWTH 5 DAYS  Final   Report Status 02/25/2017 FINAL  Final  Culture, blood (routine x 2)     Status: None   Collection Time: 02/20/17 12:19 PM  Result Value Ref Range Status   Specimen Description BLOOD A-LINE  Final   Special Requests IN PEDIATRIC BOTTLE Blood Culture adequate volume  Final   Culture NO GROWTH 5 DAYS  Final   Report Status 02/25/2017 FINAL  Final  Surgical pcr screen     Status: Abnormal   Collection Time: 02/24/17 12:11 AM  Result Value Ref Range Status   MRSA, PCR POSITIVE (A) NEGATIVE Final    Comment: RESULT CALLED TO, READ BACK BY AND VERIFIED WITH: T.OLOFIN RN 02/24/17 0337 L.CHAMPION    Staphylococcus aureus POSITIVE (A) NEGATIVE Final    Comment: (NOTE) The Xpert SA Assay (FDA approved for NASAL specimens in patients 76 years of age and older), is one component of a comprehensive surveillance program. It is not intended to diagnose infection nor to guide or monitor treatment.   Aerobic/Anaerobic Culture (surgical/deep wound)     Status: None (Preliminary result)   Collection Time: 02/24/17  1:51 PM  Result Value Ref Range Status   Specimen Description ABSCESS  Final   Special Requests POST CT GUIDED PROSTATIC FLUID  Final   Gram Stain   Final    ABUNDANT WBC PRESENT, PREDOMINANTLY PMN FEW GRAM POSITIVE COCCI IN CLUSTERS    Culture   Final    MODERATE METHICILLIN RESISTANT STAPHYLOCOCCUS AUREUS NO ANAEROBES ISOLATED; CULTURE IN PROGRESS FOR 5 DAYS    Report Status PENDING  Incomplete   Organism ID, Bacteria METHICILLIN RESISTANT STAPHYLOCOCCUS AUREUS  Final      Susceptibility   Methicillin resistant staphylococcus aureus - MIC*    CIPROFLOXACIN <=0.5 SENSITIVE Sensitive     ERYTHROMYCIN >=8 RESISTANT Resistant     GENTAMICIN <=0.5 SENSITIVE Sensitive     OXACILLIN >=4 RESISTANT  Resistant     TETRACYCLINE <=1 SENSITIVE Sensitive     VANCOMYCIN 1 SENSITIVE Sensitive  TRIMETH/SULFA <=10 SENSITIVE Sensitive     CLINDAMYCIN <=0.25 SENSITIVE Sensitive     RIFAMPIN <=0.5 SENSITIVE Sensitive     Inducible Clindamycin NEGATIVE Sensitive     * MODERATE METHICILLIN RESISTANT STAPHYLOCOCCUS AUREUS     Labs: BNP (last 3 results) Recent Labs    09/21/16 1500 10/19/16 1408 12/04/16 0609  BNP 1,292.9* 433.4* 1,958.9*   Basic Metabolic Panel: Recent Labs  Lab 02/21/17 0739 02/22/17 0719 02/23/17 0607 02/25/17 0652 02/26/17 0402  NA 134* 133* 133* 133* 134*  K 3.9 4.2 4.5 4.4 4.5  CL 109 108 109 107 106  CO2 18* 17* 15* 19* 19*  GLUCOSE 66 195* 113* 166* 83  BUN 27* 24* 22* 25* 19  CREATININE 1.22 1.12 1.00 1.29* 1.22  CALCIUM 7.9* 7.9* 7.9* 7.8* 8.0*  MG  --  1.6*  --   --   --    Liver Function Tests: Recent Labs  Lab 02/22/17 0719  AST 44*  ALT 27  ALKPHOS 146*  BILITOT 0.7  PROT 6.9  ALBUMIN 1.8*   No results for input(s): LIPASE, AMYLASE in the last 168 hours. No results for input(s): AMMONIA in the last 168 hours. CBC: Recent Labs  Lab 02/21/17 0739 02/22/17 0719 02/26/17 0402  WBC 7.1 6.6 5.5  HGB 11.7* 12.2* 12.2*  HCT 34.7* 35.7* 36.4*  MCV 84.4 84.2 84.8  PLT 311 330 342   Cardiac Enzymes: No results for input(s): CKTOTAL, CKMB, CKMBINDEX, TROPONINI in the last 168 hours. BNP: Invalid input(s): POCBNP CBG: Recent Labs  Lab 02/26/17 1225 02/26/17 1800 02/26/17 2215 02/27/17 0747 02/27/17 1135  GLUCAP 92 197* 181* 153* 142*   D-Dimer No results for input(s): DDIMER in the last 72 hours. Hgb A1c No results for input(s): HGBA1C in the last 72 hours. Lipid Profile No results for input(s): CHOL, HDL, LDLCALC, TRIG, CHOLHDL, LDLDIRECT in the last 72 hours. Thyroid function studies No results for input(s): TSH, T4TOTAL, T3FREE, THYROIDAB in the last 72 hours.  Invalid input(s): FREET3 Anemia work up No results for  input(s): VITAMINB12, FOLATE, FERRITIN, TIBC, IRON, RETICCTPCT in the last 72 hours. Urinalysis    Component Value Date/Time   COLORURINE YELLOW 02/18/2017 0311   APPEARANCEUR TURBID (A) 02/18/2017 0311   LABSPEC 1.017 02/18/2017 0311   PHURINE 6.0 02/18/2017 0311   GLUCOSEU >=500 (A) 02/18/2017 0311   HGBUR MODERATE (A) 02/18/2017 0311   BILIRUBINUR NEGATIVE 02/18/2017 0311   KETONESUR NEGATIVE 02/18/2017 0311   PROTEINUR >=300 (A) 02/18/2017 0311   UROBILINOGEN 2.0 (H) 02/20/2014 1339   NITRITE NEGATIVE 02/18/2017 0311   LEUKOCYTESUR LARGE (A) 02/18/2017 0311   Sepsis Labs Invalid input(s): PROCALCITONIN,  WBC,  LACTICIDVEN Microbiology Recent Results (from the past 240 hour(s))  Blood culture (routine x 2)     Status: None   Collection Time: 02/18/17  4:40 AM  Result Value Ref Range Status   Specimen Description BLOOD LEFT ARM  Final   Special Requests   Final    BOTTLES DRAWN AEROBIC AND ANAEROBIC Blood Culture adequate volume   Culture NO GROWTH 5 DAYS  Final   Report Status 02/23/2017 FINAL  Final  Blood culture (routine x 2)     Status: None   Collection Time: 02/18/17  5:09 AM  Result Value Ref Range Status   Specimen Description BLOOD RIGHT ARM  Final   Special Requests   Final    BOTTLES DRAWN AEROBIC ONLY Blood Culture adequate volume   Culture NO GROWTH 5 DAYS  Final   Report Status 02/23/2017 FINAL  Final  Aerobic/Anaerobic Culture (surgical/deep wound)     Status: None   Collection Time: 02/18/17  4:13 PM  Result Value Ref Range Status   Specimen Description ABSCESS DRAIN  Final   Special Requests NONE  Final   Gram Stain   Final    ABUNDANT WBC PRESENT, PREDOMINANTLY PMN ABUNDANT GRAM POSITIVE COCCI IN CLUSTERS    Culture   Final    ABUNDANT METHICILLIN RESISTANT STAPHYLOCOCCUS AUREUS NO ANAEROBES ISOLATED; CULTURE IN PROGRESS FOR 5 DAYS    Report Status 02/23/2017 FINAL  Final   Organism ID, Bacteria METHICILLIN RESISTANT STAPHYLOCOCCUS AUREUS  Final       Susceptibility   Methicillin resistant staphylococcus aureus - MIC*    CIPROFLOXACIN <=0.5 SENSITIVE Sensitive     ERYTHROMYCIN >=8 RESISTANT Resistant     GENTAMICIN <=0.5 SENSITIVE Sensitive     OXACILLIN >=4 RESISTANT Resistant     TETRACYCLINE <=1 SENSITIVE Sensitive     VANCOMYCIN 1 SENSITIVE Sensitive     TRIMETH/SULFA <=10 SENSITIVE Sensitive     CLINDAMYCIN <=0.25 SENSITIVE Sensitive     RIFAMPIN <=0.5 SENSITIVE Sensitive     Inducible Clindamycin NEGATIVE Sensitive     * ABUNDANT METHICILLIN RESISTANT STAPHYLOCOCCUS AUREUS  Culture, blood (routine x 2)     Status: None   Collection Time: 02/20/17 12:19 PM  Result Value Ref Range Status   Specimen Description BLOOD CENTRAL LINE  Final   Special Requests IN PEDIATRIC BOTTLE Blood Culture adequate volume  Final   Culture NO GROWTH 5 DAYS  Final   Report Status 02/25/2017 FINAL  Final  Culture, blood (routine x 2)     Status: None   Collection Time: 02/20/17 12:19 PM  Result Value Ref Range Status   Specimen Description BLOOD A-LINE  Final   Special Requests IN PEDIATRIC BOTTLE Blood Culture adequate volume  Final   Culture NO GROWTH 5 DAYS  Final   Report Status 02/25/2017 FINAL  Final  Surgical pcr screen     Status: Abnormal   Collection Time: 02/24/17 12:11 AM  Result Value Ref Range Status   MRSA, PCR POSITIVE (A) NEGATIVE Final    Comment: RESULT CALLED TO, READ BACK BY AND VERIFIED WITH: T.OLOFIN RN 02/24/17 0337 L.CHAMPION    Staphylococcus aureus POSITIVE (A) NEGATIVE Final    Comment: (NOTE) The Xpert SA Assay (FDA approved for NASAL specimens in patients 62 years of age and older), is one component of a comprehensive surveillance program. It is not intended to diagnose infection nor to guide or monitor treatment.   Aerobic/Anaerobic Culture (surgical/deep wound)     Status: None (Preliminary result)   Collection Time: 02/24/17  1:51 PM  Result Value Ref Range Status   Specimen Description ABSCESS   Final   Special Requests POST CT GUIDED PROSTATIC FLUID  Final   Gram Stain   Final    ABUNDANT WBC PRESENT, PREDOMINANTLY PMN FEW GRAM POSITIVE COCCI IN CLUSTERS    Culture   Final    MODERATE METHICILLIN RESISTANT STAPHYLOCOCCUS AUREUS NO ANAEROBES ISOLATED; CULTURE IN PROGRESS FOR 5 DAYS    Report Status PENDING  Incomplete   Organism ID, Bacteria METHICILLIN RESISTANT STAPHYLOCOCCUS AUREUS  Final      Susceptibility   Methicillin resistant staphylococcus aureus - MIC*    CIPROFLOXACIN <=0.5 SENSITIVE Sensitive     ERYTHROMYCIN >=8 RESISTANT Resistant     GENTAMICIN <=0.5 SENSITIVE Sensitive     OXACILLIN >=4  RESISTANT Resistant     TETRACYCLINE <=1 SENSITIVE Sensitive     VANCOMYCIN 1 SENSITIVE Sensitive     TRIMETH/SULFA <=10 SENSITIVE Sensitive     CLINDAMYCIN <=0.25 SENSITIVE Sensitive     RIFAMPIN <=0.5 SENSITIVE Sensitive     Inducible Clindamycin NEGATIVE Sensitive     * MODERATE METHICILLIN RESISTANT STAPHYLOCOCCUS AUREUS     Time coordinating discharge: Over 30 minutes  SIGNED:   Calvert Cantor, MD  [facility] 02/27/2017, 4:28 PM Pager   If 7PM-7AM, please contact night-coverage [email or web address] Password [email or web address]

## 2017-02-27 NOTE — Progress Notes (Signed)
Patient has discharge orders present, but refusing to go tonight. States he wants to go "home" and not to a facility as planned. MD notified.

## 2017-02-27 NOTE — Progress Notes (Signed)
[facility]  Date of Admission:  02/17/2017    Total days of antibiotics          8             Vancomycin 10/27 >>             Ceftriaxone 10/27 >>  10/28             Zosyn 10/27             Metronidazole 10/27  Patient ID: 61 yo AA male admitted for diarrhea and dysuria. Found to have MRSA prostate abscesses s/p IR perc drain placement 10/27. 11/2 repeat attempt for IR aspiration with some fluid removed.           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 ARIPiprazole  5 mg Oral Daily  . aspirin  81 mg Oral Daily  . atorvastatin  20 mg Oral q1800  . buPROPion  150 mg Oral Daily  . carvedilol  3.125 mg Oral BID  . digoxin  0.125 mg Oral Daily  . feeding supplement (ENSURE ENLIVE)  237 mL Oral TID BM  . feeding supplement (PRO-STAT SUGAR FREE 64)  30 mL Oral BID  . hydrALAZINE  10 mg Oral Q8H  . insulin aspart  0-9 Units Subcutaneous TID WC  . insulin glargine  10 Units Subcutaneous Daily  . nystatin  5 mL Oral QID  . rivaroxaban  20 mg Oral Q supper  . sertraline  100 mg Oral QHS  . sodium bicarbonate  650 mg Oral BID  . sodium chloride flush  5 mL Intravenous Q8H  . traZODone  100 mg Oral QHS    SUBJECTIVE: Feels ok. Ready to go to SNF. No new complaints today.   Review of Systems: Review of Systems  Constitutional: Negative for chills and fever.  HENT: Negative for tinnitus.   Eyes: Negative for blurred vision and photophobia.  Respiratory: Negative for cough and sputum production.   Cardiovascular: Negative for chest pain.  Gastrointestinal: Negative for abdominal pain, diarrhea, nausea and vomiting.  Genitourinary: Negative for dysuria.    Skin: Negative for rash.  Neurological: Negative for headaches.  Psychiatric/Behavioral: Positive for substance abuse. Negative for depression.    No Known Allergies  OBJECTIVE: Vitals:   02/26/17 1349 02/26/17 1530 02/26/17 2211 02/27/17 0651  BP: 113/63 122/69 124/79 122/71  Pulse: (!) 59  73 69  Resp: 16  18 18   Temp: (!) 97.5 F (36.4 C)  98 F (36.7 C) 98.1 F (36.7 C)  TempSrc: Oral  Oral Oral  SpO2: 95%  97% 97%  Weight:    157 lb 9.6 oz (71.5 kg)  Height:       Body mass index is 19.96 kg/m.  Physical Exam  Constitutional: He is oriented to person, place, and time and well-developed, well-nourished, and in no distress.  HENT:  Mouth/Throat: No oral lesions. Normal dentition. No dental caries.  Eyes: No scleral icterus.  Cardiovascular: Normal rate, regular rhythm and normal heart sounds.  Pulmonary/Chest: Effort normal and breath sounds normal.  Abdominal: Soft. He exhibits no distension. There is no tenderness.  Lymphadenopathy:  He has no cervical adenopathy.  Neurological: He is alert and oriented to person, place, and time.  Skin: Skin is warm and dry. No rash noted.  Psychiatric: Affect normal. He exhibits a depressed mood.    Lab Results Lab Results  Component Value Date   WBC 5.5 02/26/2017   HGB 12.2 (L) 02/26/2017   HCT 36.4 (L) 02/26/2017   MCV 84.8 02/26/2017   PLT 342 02/26/2017    Lab Results  Component Value Date   CREATININE 1.22 02/26/2017   BUN 19 02/26/2017   NA 134 (L) 02/26/2017   K 4.5 02/26/2017   CL 106 02/26/2017   CO2 19 (L) 02/26/2017    Lab Results  Component Value Date   ALT 27 02/22/2017   AST 44 (H) 02/22/2017   ALKPHOS 146 (H) 02/22/2017   BILITOT 0.7 02/22/2017     Microbiology: Recent Results (from the past 240 hour(s))  Blood culture (routine x 2)     Status: None   Collection Time: 02/18/17  4:40 AM  Result Value Ref Range Status   Specimen Description BLOOD LEFT ARM  Final   Special Requests    Final    BOTTLES DRAWN AEROBIC AND ANAEROBIC Blood Culture adequate volume   Culture NO GROWTH 5 DAYS  Final   Report Status 02/23/2017 FINAL  Final  Blood culture (routine x 2)     Status: None   Collection Time: 02/18/17  5:09 AM  Result Value Ref Range Status   Specimen Description BLOOD RIGHT ARM  Final   Special Requests   Final    BOTTLES DRAWN AEROBIC ONLY Blood Culture adequate volume   Culture NO GROWTH 5 DAYS  Final   Report Status 02/23/2017 FINAL  Final  Aerobic/Anaerobic Culture (surgical/deep wound)     Status: None   Collection Time: 02/18/17  4:13 PM  Result Value Ref Range Status   Specimen Description ABSCESS DRAIN  Final   Special Requests NONE  Final   Gram Stain   Final    ABUNDANT WBC PRESENT, PREDOMINANTLY PMN ABUNDANT GRAM POSITIVE COCCI IN CLUSTERS    Culture   Final    ABUNDANT METHICILLIN RESISTANT STAPHYLOCOCCUS AUREUS NO ANAEROBES ISOLATED; CULTURE IN PROGRESS FOR 5 DAYS    Report Status 02/23/2017 FINAL  Final   Organism ID, Bacteria METHICILLIN RESISTANT STAPHYLOCOCCUS AUREUS  Final      Susceptibility   Methicillin resistant staphylococcus aureus - MIC*    CIPROFLOXACIN <=0.5 SENSITIVE Sensitive     ERYTHROMYCIN >=8 RESISTANT Resistant     GENTAMICIN <=0.5 SENSITIVE Sensitive     OXACILLIN >=4 RESISTANT Resistant     TETRACYCLINE <=1 SENSITIVE Sensitive     VANCOMYCIN 1 SENSITIVE Sensitive     TRIMETH/SULFA <=10 SENSITIVE Sensitive     CLINDAMYCIN <=0.25 SENSITIVE Sensitive     RIFAMPIN <=0.5 SENSITIVE Sensitive     Inducible Clindamycin NEGATIVE Sensitive     * ABUNDANT METHICILLIN RESISTANT STAPHYLOCOCCUS AUREUS  Culture, blood (routine x 2)     Status: None   Collection Time: 02/20/17 12:19 PM  Result Value Ref Range Status   Specimen Description BLOOD CENTRAL LINE  Final   Special Requests IN PEDIATRIC BOTTLE Blood Culture adequate volume  Final   Culture NO GROWTH 5 DAYS  Final   Report Status 02/25/2017 FINAL  Final  Culture, blood  (routine x 2)     Status: None   Collection Time: 02/20/17 12:19 PM  Result Value Ref Range Status  Specimen Description BLOOD A-LINE  Final   Special Requests IN PEDIATRIC BOTTLE Blood Culture adequate volume  Final   Culture NO GROWTH 5 DAYS  Final   Report Status 02/25/2017 FINAL  Final  Surgical pcr screen     Status: Abnormal   Collection Time: 02/24/17 12:11 AM  Result Value Ref Range Status   MRSA, PCR POSITIVE (A) NEGATIVE Final    Comment: RESULT CALLED TO, READ BACK BY AND VERIFIED WITH: T.OLOFIN RN 02/24/17 0337 L.CHAMPION    Staphylococcus aureus POSITIVE (A) NEGATIVE Final    Comment: (NOTE) The Xpert SA Assay (FDA approved for NASAL specimens in patients 34 years of age and older), is one component of a comprehensive surveillance program. It is not intended to diagnose infection nor to guide or monitor treatment.   Aerobic/Anaerobic Culture (surgical/deep wound)     Status: None (Preliminary result)   Collection Time: 02/24/17  1:51 PM  Result Value Ref Range Status   Specimen Description ABSCESS  Final   Special Requests POST CT GUIDED PROSTATIC FLUID  Final   Gram Stain   Final    ABUNDANT WBC PRESENT, PREDOMINANTLY PMN FEW GRAM POSITIVE COCCI IN CLUSTERS    Culture   Final    MODERATE METHICILLIN RESISTANT STAPHYLOCOCCUS AUREUS NO ANAEROBES ISOLATED; CULTURE IN PROGRESS FOR 5 DAYS    Report Status PENDING  Incomplete   Organism ID, Bacteria METHICILLIN RESISTANT STAPHYLOCOCCUS AUREUS  Final      Susceptibility   Methicillin resistant staphylococcus aureus - MIC*    CIPROFLOXACIN <=0.5 SENSITIVE Sensitive     ERYTHROMYCIN >=8 RESISTANT Resistant     GENTAMICIN <=0.5 SENSITIVE Sensitive     OXACILLIN >=4 RESISTANT Resistant     TETRACYCLINE <=1 SENSITIVE Sensitive     VANCOMYCIN 1 SENSITIVE Sensitive     TRIMETH/SULFA <=10 SENSITIVE Sensitive     CLINDAMYCIN <=0.25 SENSITIVE Sensitive     RIFAMPIN <=0.5 SENSITIVE Sensitive     Inducible Clindamycin  NEGATIVE Sensitive     * MODERATE METHICILLIN RESISTANT STAPHYLOCOCCUS AUREUS   ASSESSMENT: Complicated MRSA prostate abscesses - s/p IR perc drain, s/p IR aspiration and now needs drain replaced prior to discharge home.   Untreated Hep C - viral load pending, HIV negative.   PLAN:  1. Will stop Vancomycin and transition to Bactrim x 3 weeks 2. Will need BMET in 1 week to monitor for hyperkalemia (he will likely be put back on Entresto at some point for his severe HF).  3. Follow up with [facility] in ~3 weeks ideally after he has follow up with urology.  4. Can discuss outpatient Hep C treatment at future appointment if he desires treatment.     Rexene Alberts, MSN, NP-C [facility] Cell: [phone] Pager: [phone]  02/27/2017  2:06 PM

## 2017-02-27 NOTE — Care Management Important Message (Signed)
Important Message  Patient Details  Name: [name] MRN: [id number] Date of Birth: [birth year]   Medicare Important Message Given:  Yes    Leone Haven, RN 02/27/2017, 1:05 PMImportant Message  Patient Details  Name: [name] MRN: [id number] Date of Birth: 05-24-1955   Medicare Important Message Given:  Yes    Leone Haven, RN 02/27/2017, 1:04 PM

## 2017-02-28 ENCOUNTER — Other Ambulatory Visit: Payer: Self-pay

## 2017-02-28 DIAGNOSIS — N410 Acute prostatitis: Secondary | ICD-10-CM | POA: Diagnosis not present

## 2017-02-28 DIAGNOSIS — B999 Unspecified infectious disease: Secondary | ICD-10-CM | POA: Diagnosis not present

## 2017-02-28 DIAGNOSIS — E119 Type 2 diabetes mellitus without complications: Secondary | ICD-10-CM | POA: Diagnosis not present

## 2017-02-28 DIAGNOSIS — N412 Abscess of prostate: Secondary | ICD-10-CM | POA: Diagnosis not present

## 2017-02-28 DIAGNOSIS — I5040 Unspecified combined systolic (congestive) and diastolic (congestive) heart failure: Secondary | ICD-10-CM | POA: Diagnosis not present

## 2017-02-28 DIAGNOSIS — E1129 Type 2 diabetes mellitus with other diabetic kidney complication: Secondary | ICD-10-CM | POA: Diagnosis not present

## 2017-02-28 DIAGNOSIS — I10 Essential (primary) hypertension: Secondary | ICD-10-CM | POA: Diagnosis not present

## 2017-02-28 DIAGNOSIS — N492 Inflammatory disorders of scrotum: Secondary | ICD-10-CM | POA: Diagnosis not present

## 2017-02-28 DIAGNOSIS — I5020 Unspecified systolic (congestive) heart failure: Secondary | ICD-10-CM | POA: Diagnosis not present

## 2017-02-28 DIAGNOSIS — N179 Acute kidney failure, unspecified: Secondary | ICD-10-CM | POA: Diagnosis not present

## 2017-02-28 DIAGNOSIS — I429 Cardiomyopathy, unspecified: Secondary | ICD-10-CM | POA: Diagnosis not present

## 2017-02-28 DIAGNOSIS — B9562 Methicillin resistant Staphylococcus aureus infection as the cause of diseases classified elsewhere: Secondary | ICD-10-CM | POA: Diagnosis not present

## 2017-02-28 DIAGNOSIS — R102 Pelvic and perineal pain: Secondary | ICD-10-CM | POA: Diagnosis not present

## 2017-02-28 DIAGNOSIS — I2510 Atherosclerotic heart disease of native coronary artery without angina pectoris: Secondary | ICD-10-CM | POA: Diagnosis not present

## 2017-02-28 DIAGNOSIS — I5022 Chronic systolic (congestive) heart failure: Secondary | ICD-10-CM | POA: Diagnosis not present

## 2017-02-28 DIAGNOSIS — R0600 Dyspnea, unspecified: Secondary | ICD-10-CM | POA: Diagnosis not present

## 2017-02-28 DIAGNOSIS — M6281 Muscle weakness (generalized): Secondary | ICD-10-CM | POA: Diagnosis not present

## 2017-02-28 LAB — GLUCOSE, CAPILLARY
Glucose-Capillary: 89 mg/dL (ref 65–99)
Glucose-Capillary: 98 mg/dL (ref 65–99)

## 2017-02-28 MED ORDER — OXYCODONE HCL 5 MG PO TABS: 5.0000 mg | ORAL_TABLET | Freq: Four times a day (QID) | ORAL | 0 refills | Status: AC | PRN

## 2017-02-28 MED ORDER — INFLUENZA VAC SPLIT QUAD 0.5 ML IM SUSY: 0.5000 mL | PREFILLED_SYRINGE | INTRAMUSCULAR | Status: DC

## 2017-02-28 NOTE — Progress Notes (Signed)
Initial Nutrition Assessment  DOCUMENTATION CODES:   Non-severe (moderate) malnutrition in context of chronic illness  INTERVENTION:  1. Continue Ensure Enlive po TID, each supplement provides 350 kcal and 20 grams of protein  2. Continue Pro-stat 24mL BID   NUTRITION DIAGNOSIS:   Moderate Malnutrition related to chronic illness as evidenced by moderate fat depletion, moderate muscle depletion, severe muscle depletion.  GOAL:   Patient will meet greater than or equal to 90% of their needs  MONITOR:   PO intake, I & O's, Labs, Weight trends, Supplement acceptance  REASON FOR ASSESSMENT:   Malnutrition Screening Tool    ASSESSMENT:    61 yo M with polysubstance abuse, CHF EF 10-15%, HTN, IDDM who presents with few days dysuria, found to have sepsis from prostatitis, prostatic abscess. AKI, hyponatremia, thrush  Spoke with patient at bedside. He reports he wasn't eaten prior to admission - rather was drinking alcohol heavily daily. Reports a normal weight of 193 pounds 1.5 years ago, now down to 140 pounds. Weight has consistently been between 154-157 pounds upon this admission. Believes his weight is 143 pounds currently. Didn't eat a lot of breakfast this morning. Had eggs and cheese that were cold. Has been consuming ensure and pro-stat.   Labs reviewed:  Na 134 Medications reviewed and include:  Novolog 0-9 Units TID, Lantus 10 Units      NUTRITION - FOCUSED PHYSICAL EXAM:    Most Recent Value  Orbital Region  No depletion  Upper Arm Region  Moderate depletion  Thoracic and Lumbar Region  Moderate depletion  Buccal Region  Moderate depletion  Temple Region  Moderate depletion  Clavicle Bone Region  Severe depletion  Clavicle and Acromion Bone Region  Severe depletion  Scapular Bone Region  Severe depletion  Dorsal Hand  Moderate depletion  Patellar Region  Moderate depletion  Anterior Thigh Region  Moderate depletion  Posterior Calf Region  Moderate  depletion  Edema (RD Assessment)  None  Hair  Reviewed  Eyes  Reviewed  Mouth  Reviewed  Skin  Reviewed  Nails  Reviewed       Diet Order:  Diet Carb Modified Fluid consistency: Thin; Room service appropriate? Yes Diet - low sodium heart healthy  EDUCATION NEEDS:   Education needs have been addressed  Skin:  Skin Assessment: Skin Integrity Issues: Skin Integrity Issues:: Other (Comment) Other: Abrasion to R Hip  Last BM:  02/27/2017  Height:   Ht Readings from Last 1 Encounters:  02/17/17 6' 2.5" (1.892 m)    Weight:   Wt Readings from Last 1 Encounters:  02/28/17 172 lb 6.4 oz (78.2 kg)    Ideal Body Weight:  87.72 kg  BMI:  Body mass index is 21.84 kg/m.  Estimated Nutritional Needs:   Kcal:  1800-2000 calories  Protein:  93-110 grams  Fluid:  1.8-2L  Dionne Ano. Anitta Tenny, MS, RD LDN Inpatient Clinical Dietitian Pager [phone]

## 2017-02-28 NOTE — Progress Notes (Signed)
[facility]  Patient discharged yesterday but facility declined to take him. He has been accepted to another facility and remains stable for d/c today. He states he doesn't want to leave and will call his insurance company who will not have a problem with letting him stay.  Please see my updated d/c summary from 11/5.   Calvert Cantor, MD

## 2017-02-28 NOTE — Progress Notes (Signed)
Patient discharge teaching given, including activity, diet, follow-up appoints, and medications. Patient verbalized understanding of all discharge instructions. IV access was d/c'd. Vitals are stable. Skin is intact except as charted in most recent assessments. Pt to be escorted out by [facility]. 

## 2017-02-28 NOTE — Clinical Social Work Placement (Signed)
   CLINICAL SOCIAL WORK PLACEMENT  NOTE  Date:  02/28/2017  Patient Details  Name: [name] MRN: [id number] Date of Birth: 1955-10-30  Clinical Social Work is seeking post-discharge placement for this patient at the Skilled  Nursing Facility level of care (*CSW will initial, date and re-position this form in  chart as items are completed):  Yes   Patient/family provided with [place] Clinical Social Work Department's list of facilities offering this level of care within the geographic area requested by the patient (or if unable, by the patient's family).  Yes   Patient/family informed of their freedom to choose among providers that offer the needed level of care, that participate in Medicare, Medicaid or managed care program needed by the patient, have an available bed and are willing to accept the patient.  Yes   Patient/family informed of [facility]'s ownership interest in [facility] and [facility], as well as of the fact that they are under no obligation to receive care at these facilities.  [name] submitted to EDS on 02/22/17     [name] number received on       Existing [name] number confirmed on       FL2 transmitted to all facilities in geographic area requested by pt/family on 02/22/17     FL2 transmitted to all facilities within larger geographic area on       Patient informed that his/her managed care company has contracts with or will negotiate with certain facilities, including the following:        Yes   Patient/family informed of bed offers received.  Patient chooses bed at [facility]     Physician recommends and patient chooses bed at      Patient to be transferred to [facility] on 02/28/17.  Patient to be transferred to facility by [facility]     Patient family notified on 02/28/17 of transfer.  Name of family member notified:  N/A     PHYSICIAN Please sign FL2     Additional Comment:     _______________________________________________ Mearl Latin, LCSWA 02/28/2017, 2:47 PM

## 2017-02-28 NOTE — Progress Notes (Signed)
2:30pm-RNCM spoke with patient. He stated he was getting his clothes on to be transported. CSW arranging [facility].   2pm-CSW went to tell patient that [facility] was ready to be scheduled. Patient stated he was not leaving the hospital today. When CSW inquired why, he stated that he needed time. CSW explained that he has been medically discharged. Patient stated that he has rights with the insurance company and that he will refuse to get in the ambulance. CSW alerted RNCM.  [name] LCSWA [phone]

## 2017-02-28 NOTE — Progress Notes (Signed)
Patient will DC to: [facility] Anticipated DC date: 02/28/17 Family notified: N/A Transport by: Sharin Mons   Per MD patient ready for DC to [facility]. RN, patient, patient's family, and facility notified of DC. Discharge Summary sent to facility. RN given number for report [phone]). DC packet on chart. Ambulance transport requested for patient.   CSW signing off.  Cristobal Goldmann, [place] Clinical Social Worker [phone]

## 2017-03-01 ENCOUNTER — Other Ambulatory Visit: Payer: Self-pay | Admitting: Internal Medicine

## 2017-03-01 DIAGNOSIS — N419 Inflammatory disease of prostate, unspecified: Secondary | ICD-10-CM

## 2017-03-01 LAB — AEROBIC/ANAEROBIC CULTURE (SURGICAL/DEEP WOUND)

## 2017-03-01 LAB — AEROBIC/ANAEROBIC CULTURE W GRAM STAIN (SURGICAL/DEEP WOUND)

## 2017-03-02 DIAGNOSIS — I5020 Unspecified systolic (congestive) heart failure: Secondary | ICD-10-CM | POA: Diagnosis not present

## 2017-03-02 DIAGNOSIS — E119 Type 2 diabetes mellitus without complications: Secondary | ICD-10-CM | POA: Diagnosis not present

## 2017-03-02 DIAGNOSIS — N492 Inflammatory disorders of scrotum: Secondary | ICD-10-CM | POA: Diagnosis not present

## 2017-03-02 DIAGNOSIS — B9562 Methicillin resistant Staphylococcus aureus infection as the cause of diseases classified elsewhere: Secondary | ICD-10-CM | POA: Diagnosis not present

## 2017-03-05 DIAGNOSIS — E119 Type 2 diabetes mellitus without complications: Secondary | ICD-10-CM | POA: Diagnosis not present

## 2017-03-05 DIAGNOSIS — I10 Essential (primary) hypertension: Secondary | ICD-10-CM | POA: Diagnosis not present

## 2017-03-05 DIAGNOSIS — I2510 Atherosclerotic heart disease of native coronary artery without angina pectoris: Secondary | ICD-10-CM | POA: Diagnosis not present

## 2017-03-05 DIAGNOSIS — N179 Acute kidney failure, unspecified: Secondary | ICD-10-CM | POA: Diagnosis not present

## 2017-03-05 DIAGNOSIS — I429 Cardiomyopathy, unspecified: Secondary | ICD-10-CM | POA: Diagnosis not present

## 2017-03-09 ENCOUNTER — Other Ambulatory Visit: Payer: Self-pay

## 2017-03-14 ENCOUNTER — Other Ambulatory Visit: Payer: Self-pay

## 2017-03-15 ENCOUNTER — Inpatient Hospital Stay: Admission: RE | Admit: 2017-03-15 | Payer: Self-pay | Source: Ambulatory Visit

## 2017-03-15 ENCOUNTER — Other Ambulatory Visit: Payer: Self-pay

## 2017-03-21 ENCOUNTER — Inpatient Hospital Stay: Payer: Self-pay | Admitting: Internal Medicine

## 2017-03-21 ENCOUNTER — Other Ambulatory Visit: Payer: Self-pay

## 2017-03-21 DIAGNOSIS — N492 Inflammatory disorders of scrotum: Secondary | ICD-10-CM | POA: Diagnosis not present

## 2017-03-21 DIAGNOSIS — E119 Type 2 diabetes mellitus without complications: Secondary | ICD-10-CM | POA: Diagnosis not present

## 2017-03-23 DIAGNOSIS — E119 Type 2 diabetes mellitus without complications: Secondary | ICD-10-CM | POA: Diagnosis not present

## 2017-03-24 DIAGNOSIS — E119 Type 2 diabetes mellitus without complications: Secondary | ICD-10-CM | POA: Diagnosis not present

## 2017-03-28 ENCOUNTER — Inpatient Hospital Stay: Admission: RE | Admit: 2017-03-28 | Payer: Self-pay | Source: Ambulatory Visit

## 2017-03-28 ENCOUNTER — Other Ambulatory Visit: Payer: Self-pay

## 2017-03-30 ENCOUNTER — Inpatient Hospital Stay: Payer: Self-pay | Admitting: Internal Medicine

## 2017-04-04 DIAGNOSIS — I110 Hypertensive heart disease with heart failure: Secondary | ICD-10-CM | POA: Diagnosis not present

## 2017-04-04 DIAGNOSIS — I429 Cardiomyopathy, unspecified: Secondary | ICD-10-CM | POA: Diagnosis not present

## 2017-04-04 DIAGNOSIS — N412 Abscess of prostate: Secondary | ICD-10-CM | POA: Diagnosis not present

## 2017-04-04 DIAGNOSIS — E119 Type 2 diabetes mellitus without complications: Secondary | ICD-10-CM | POA: Diagnosis not present

## 2017-04-04 DIAGNOSIS — I509 Heart failure, unspecified: Secondary | ICD-10-CM | POA: Diagnosis not present

## 2017-04-06 DIAGNOSIS — R278 Other lack of coordination: Secondary | ICD-10-CM | POA: Diagnosis not present

## 2017-04-06 DIAGNOSIS — I509 Heart failure, unspecified: Secondary | ICD-10-CM | POA: Diagnosis not present

## 2017-04-06 DIAGNOSIS — I5022 Chronic systolic (congestive) heart failure: Secondary | ICD-10-CM | POA: Diagnosis not present

## 2017-04-06 DIAGNOSIS — R2689 Other abnormalities of gait and mobility: Secondary | ICD-10-CM | POA: Diagnosis not present

## 2017-04-06 DIAGNOSIS — I2510 Atherosclerotic heart disease of native coronary artery without angina pectoris: Secondary | ICD-10-CM | POA: Diagnosis not present

## 2017-04-06 DIAGNOSIS — I2699 Other pulmonary embolism without acute cor pulmonale: Secondary | ICD-10-CM | POA: Diagnosis not present

## 2017-04-06 DIAGNOSIS — E46 Unspecified protein-calorie malnutrition: Secondary | ICD-10-CM | POA: Diagnosis not present

## 2017-04-07 DIAGNOSIS — R278 Other lack of coordination: Secondary | ICD-10-CM | POA: Diagnosis not present

## 2017-04-07 DIAGNOSIS — R2689 Other abnormalities of gait and mobility: Secondary | ICD-10-CM | POA: Diagnosis not present

## 2017-04-07 DIAGNOSIS — I5022 Chronic systolic (congestive) heart failure: Secondary | ICD-10-CM | POA: Diagnosis not present

## 2017-04-08 DIAGNOSIS — R278 Other lack of coordination: Secondary | ICD-10-CM | POA: Diagnosis not present

## 2017-04-08 DIAGNOSIS — I5022 Chronic systolic (congestive) heart failure: Secondary | ICD-10-CM | POA: Diagnosis not present

## 2017-04-08 DIAGNOSIS — R2689 Other abnormalities of gait and mobility: Secondary | ICD-10-CM | POA: Diagnosis not present

## 2017-04-10 ENCOUNTER — Emergency Department (HOSPITAL_COMMUNITY)
Admission: EM | Admit: 2017-04-10 | Discharge: 2017-04-10 | Disposition: A | Discharge status: Home / Self Care | Payer: Medicare Other | Attending: Emergency Medicine | Admitting: Emergency Medicine

## 2017-04-10 ENCOUNTER — Emergency Department (HOSPITAL_COMMUNITY): Payer: Medicare Other

## 2017-04-10 ENCOUNTER — Encounter (HOSPITAL_COMMUNITY): Payer: Self-pay

## 2017-04-10 DIAGNOSIS — Z794 Long term (current) use of insulin: Secondary | ICD-10-CM | POA: Diagnosis not present

## 2017-04-10 DIAGNOSIS — M6281 Muscle weakness (generalized): Secondary | ICD-10-CM | POA: Insufficient documentation

## 2017-04-10 DIAGNOSIS — I5022 Chronic systolic (congestive) heart failure: Secondary | ICD-10-CM | POA: Insufficient documentation

## 2017-04-10 DIAGNOSIS — Z79899 Other long term (current) drug therapy: Secondary | ICD-10-CM | POA: Insufficient documentation

## 2017-04-10 DIAGNOSIS — J90 Pleural effusion, not elsewhere classified: Secondary | ICD-10-CM | POA: Diagnosis not present

## 2017-04-10 DIAGNOSIS — F17210 Nicotine dependence, cigarettes, uncomplicated: Secondary | ICD-10-CM | POA: Diagnosis not present

## 2017-04-10 DIAGNOSIS — Z590 Homelessness: Secondary | ICD-10-CM | POA: Insufficient documentation

## 2017-04-10 DIAGNOSIS — E119 Type 2 diabetes mellitus without complications: Secondary | ICD-10-CM | POA: Diagnosis not present

## 2017-04-10 DIAGNOSIS — I252 Old myocardial infarction: Secondary | ICD-10-CM | POA: Insufficient documentation

## 2017-04-10 DIAGNOSIS — Z7901 Long term (current) use of anticoagulants: Secondary | ICD-10-CM | POA: Insufficient documentation

## 2017-04-10 DIAGNOSIS — I110 Hypertensive heart disease with heart failure: Secondary | ICD-10-CM | POA: Insufficient documentation

## 2017-04-10 DIAGNOSIS — J9811 Atelectasis: Secondary | ICD-10-CM | POA: Diagnosis not present

## 2017-04-10 DIAGNOSIS — R9431 Abnormal electrocardiogram [ECG] [EKG]: Secondary | ICD-10-CM | POA: Diagnosis not present

## 2017-04-10 DIAGNOSIS — R531 Weakness: Secondary | ICD-10-CM

## 2017-04-10 DIAGNOSIS — R404 Transient alteration of awareness: Secondary | ICD-10-CM | POA: Diagnosis not present

## 2017-04-10 DIAGNOSIS — I2510 Atherosclerotic heart disease of native coronary artery without angina pectoris: Secondary | ICD-10-CM | POA: Insufficient documentation

## 2017-04-10 LAB — CBC WITH DIFFERENTIAL/PLATELET
BASOS ABS: 0 10*3/uL (ref 0.0–0.1)
BASOS PCT: 0 %
Eosinophils Absolute: 0.1 10*3/uL (ref 0.0–0.7)
Eosinophils Relative: 1 %
HEMATOCRIT: 34 % — AB (ref 39.0–52.0)
HEMOGLOBIN: 11.3 g/dL — AB (ref 13.0–17.0)
Lymphocytes Relative: 38 %
Lymphs Abs: 2 10*3/uL (ref 0.7–4.0)
MCH: 29.1 pg (ref 26.0–34.0)
MCHC: 33.2 g/dL (ref 30.0–36.0)
MCV: 87.6 fL (ref 78.0–100.0)
MONO ABS: 0.2 10*3/uL (ref 0.1–1.0)
Monocytes Relative: 5 %
NEUTROS ABS: 2.9 10*3/uL (ref 1.7–7.7)
NEUTROS PCT: 56 %
Platelets: 262 10*3/uL (ref 150–400)
RBC: 3.88 MIL/uL — ABNORMAL LOW (ref 4.22–5.81)
RDW: 16.7 % — AB (ref 11.5–15.5)
WBC: 5.2 10*3/uL (ref 4.0–10.5)

## 2017-04-10 LAB — COMPREHENSIVE METABOLIC PANEL
ALT: 25 U/L (ref 17–63)
ANION GAP: 9 (ref 5–15)
AST: 37 U/L (ref 15–41)
Albumin: 2.8 g/dL — ABNORMAL LOW (ref 3.5–5.0)
Alkaline Phosphatase: 108 U/L (ref 38–126)
BUN: 31 mg/dL — ABNORMAL HIGH (ref 6–20)
CHLORIDE: 111 mmol/L (ref 101–111)
CO2: 18 mmol/L — AB (ref 22–32)
Calcium: 8.4 mg/dL — ABNORMAL LOW (ref 8.9–10.3)
Creatinine, Ser: 1.52 mg/dL — ABNORMAL HIGH (ref 0.61–1.24)
GFR calc non Af Amer: 47 mL/min — ABNORMAL LOW (ref >60)
GFR, EST AFRICAN AMERICAN: 55 mL/min — AB (ref >60)
Glucose, Bld: 168 mg/dL — ABNORMAL HIGH (ref 65–99)
POTASSIUM: 3.9 mmol/L (ref 3.5–5.1)
SODIUM: 138 mmol/L (ref 135–145)
Total Bilirubin: 0.9 mg/dL (ref 0.3–1.2)
Total Protein: 7.5 g/dL (ref 6.5–8.1)

## 2017-04-10 LAB — I-STAT TROPONIN, ED: TROPONIN I, POC: 0.04 ng/mL (ref 0.00–0.08)

## 2017-04-10 LAB — ETHANOL: Alcohol, Ethyl (B): <10 mg/dL (ref <10)

## 2017-04-10 NOTE — Discharge Instructions (Signed)
Follow-up with your primary doctor in the next week, and return to the emergency department if symptoms significantly change or worsen in the meantime.

## 2017-04-10 NOTE — ED Triage Notes (Signed)
Pt arrived via [facility] with reports of increased weakness and swelling in legs. EMS reports pt found in neighborhood, pt was walking and states his legs gave out. Pt states he does use a cane at home and did not have it with him.

## 2017-04-10 NOTE — ED Notes (Signed)
Bed: KA76 Expected date:  Expected time:  Means of arrival:  Comments: 41m leg weakness

## 2017-04-10 NOTE — ED Provider Notes (Signed)
[facility]-EMERGENCY DEPT Provider Note   CSN: [id number] Arrival date & time: 04/10/17  0407     History   Chief Complaint Chief Complaint  Patient presents with  . Weakness    HPI [name] is a 61 y.o. male.  Patient is a 61 year old male with extensive past medical history including CHF, nonischemic cardiomyopathy with EF of 10-15%, diabetes, hep C, and homelessness.  He was brought by EMS for evaluation of weakness.  The patient states that he was at his cousin's house this evening.  When his cousin left to go to work, the patient walked to a friend's house for a ride to his shelter.  While he was walking he reports his legs becoming weak and unable to support him.  He reports falling to the ground and having to call for help.  Someone heard his calls, then called 911 and he was transported here.  He denies any specific pains including chest pain or abdominal pain.  He denies any shortness of breath.  He does report some increased leg swelling.   The history is provided by the patient.  Weakness  Primary symptoms include no focal weakness. This is a new problem. The current episode started 1 to 2 hours ago. The problem has not changed since onset.There was no focality noted. There has been no fever. Pertinent negatives include no shortness of breath and no chest pain.    Past Medical History:  Diagnosis Date  . CHF (congestive heart failure) (HCC)   . Coronary artery disease   . Depression   . Diabetes mellitus   . Hypertension   . Mental disorder   . MI (myocardial infarction) [facility](HCC) 211984   age 61 per patient    Patient Active Problem List   Diagnosis Date Noted  . Prostate abscess 02/18/2017  . AKI (acute kidney injury) (HCC) 02/18/2017  . Myocardial ischemia   . Pulmonary embolus (HCC)   . Cocaine abuse (HCC)   . H/O noncompliance with medical treatment, presenting hazards to health 12/04/2016  . Chronic systolic CHF (congestive  heart failure) (HCC) 12/04/2016  . Anxiety state   . Depression with anxiety 08/05/2016  . DNR (do not resuscitate) discussion   . Palliative care encounter   . Polysubstance abuse (HCC)   . Type 2 diabetes mellitus with complication (HCC)   . Hypertensive heart disease with chronic systolic congestive heart failure (HCC)   . 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 Hyponatremia 02/23/2014  . Alcohol abuse 12/21/2012  . Cocaine dependence (HCC) 12/21/2012  . Substance induced mood disorder (HCC) 12/21/2012  . Coronary artery disease 08/14/2012  . Cardiomyopathy, nonischemic (HCC) 08/14/2012    Past Surgical History:  Procedure Laterality Date  . ANGIOPLASTY  1984   at age 61  . IR CATHETER TUBE CHANGE  02/27/2017  . LEFT AND RIGHT HEART CATHETERIZATION WITH CORONARY/GRAFT ANGIOGRAM  08/13/2012   Procedure: LEFT AND RIGHT HEART CATHETERIZATION WITH Isabel CapriceORONARY/GRAFT ANGIOGRAM;  Surgeon: Kathleene Hazelhristopher D McAlhany, MD;  [facility];  Service: Cardiovascular;;       Home Medications    Prior to Admission medications   Medication Sig Start Date End Date Taking? Authorizing Provider  acetaminophen (TYLENOL) 325 MG tablet Take 650 mg by mouth every 6 (six) hours as needed for mild pain.    [provider]  Amino Acids-Protein Hydrolys (FEEDING SUPPLEMENT, PRO-STAT SUGAR FREE 64,) LIQD Take 30 mLs 2 (two) times daily by mouth. 02/27/17   Rizwan,  Saima, MD  ARIPiprazole (ABILIFY) 10 MG tablet Take 5 mg by mouth daily.    [provider]  atorvastatin (LIPITOR) 20 MG tablet Take 1 tablet (20 mg total) by mouth daily at 6 PM. 09/21/16   Graciella Freer, PA-C  buPROPion (WELLBUTRIN XL) 150 MG 24 hr tablet Take 1 tablet (150 mg total) by mouth daily. 12/06/16   Rozann Lesches, MD  carvedilol (COREG) 3.125 MG tablet Take 1 tablet (3.125 mg total) by mouth 2 (two) times daily. 12/06/16 12/06/17  Valentino Nose, MD  digoxin (LANOXIN) 0.125 MG tablet Take 1 tablet (0.125 mg  total) by mouth daily. 09/21/16   Graciella Freer, PA-C  feeding supplement, ENSURE ENLIVE, (ENSURE ENLIVE) LIQD Take 237 mLs 3 (three) times daily between meals by mouth. 02/27/17   Calvert Cantor, MD  furosemide (LASIX) 40 MG tablet Take 1 tablet (40 mg total) daily as needed by mouth. For weight gain of > 3 lb in 24-48 hr period 02/27/17   Calvert Cantor, MD  hydrALAZINE (APRESOLINE) 25 MG tablet Take 1 tablet (25 mg total) by mouth 3 (three) times daily. 09/21/16   Graciella Freer, PA-C  insulin glargine (LANTUS) 100 UNIT/ML injection Inject 0.1 mLs (10 Units total) daily into the skin. 02/28/17   Calvert Cantor, MD  isosorbide mononitrate (IMDUR) 30 MG 24 hr tablet Take 1 tablet (30 mg total) by mouth daily. 09/21/16   Graciella Freer, PA-C  metFORMIN (GLUCOPHAGE) 1000 MG tablet Take 1 tablet (1,000 mg total) by mouth 2 (two) times daily with a meal. For high blood sugar control 08/13/16   Maxie Barb, MD  oxyCODONE (OXY IR/ROXICODONE) 5 MG immediate release tablet Take 1 tablet (5 mg total) every 6 (six) hours as needed by mouth for moderate pain. 02/28/17   Calvert Cantor, MD  rivaroxaban (XARELTO) 20 MG TABS tablet Take 1 tablet (20 mg total) by mouth daily with supper. 12/26/16   Rozann Lesches, MD  sertraline (ZOLOFT) 100 MG tablet Take 100 mg by mouth at bedtime.     [provider]  sodium bicarbonate 650 MG tablet Take 1 tablet (650 mg total) 2 (two) times daily by mouth. 02/27/17   Calvert Cantor, MD  sulfamethoxazole-trimethoprim (BACTRIM DS,SEPTRA DS) 800-160 MG tablet Take 1 tablet every 12 (twelve) hours by mouth. 02/27/17   Calvert Cantor, MD  traZODone (DESYREL) 100 MG tablet Take 1 tablet (100 mg total) by mouth at bedtime. For sleep 12/27/12   Sanjuana Kava, NP    Family History Family History  Problem Relation Age of Onset  . Heart attack Father        >60s  . Hypertension Father   . Diabetes Father   . Heart attack Sister   . Hypertension  Mother     Social History Social History   Tobacco Use  . Smoking status: Current Every Day Smoker    Packs/day: 0.50    Years: 40.00    Pack years: 20.00    Types: Cigarettes  . Smokeless tobacco: Never Used  Substance Use Topics  . Alcohol use: Yes    Comment: 4 40 oz beers daily  . Drug use: Yes    Frequency: 7.0 times per week    Types: Marijuana, "Crack" cocaine    Comment: currently using     Allergies   Patient has no known allergies.   Review of Systems Review of Systems  Respiratory: Negative for shortness of breath.   Cardiovascular: Negative for chest  pain.  Neurological: Positive for weakness. Negative for focal weakness.  All other systems reviewed and are negative.    Physical Exam Updated Vital Signs There were no vitals taken for this visit.  Physical Exam  Constitutional: He is oriented to person, place, and time. He appears well-developed and well-nourished. No distress.  HENT:  Head: Normocephalic and atraumatic.  Mouth/Throat: Oropharynx is clear and moist.  Neck: Normal range of motion. Neck supple.  Cardiovascular: Normal rate and regular rhythm. Exam reveals no friction rub.  No murmur heard. Pulmonary/Chest: Effort normal and breath sounds normal. No respiratory distress. He has no wheezes. He has no rales.  Abdominal: Soft. Bowel sounds are normal. He exhibits no distension. There is no tenderness.  Musculoskeletal: Normal range of motion. He exhibits edema.  There is 1+ pitting edema of both lower extremities.  Neurological: He is alert and oriented to person, place, and time. Coordination normal.  Skin: Skin is warm and dry. He is not diaphoretic.  Nursing note and vitals reviewed.    ED Treatments / Results  Labs (all labs ordered are listed, but only abnormal results are displayed) Labs Reviewed  RAPID URINE DRUG SCREEN, HOSP PERFORMED  COMPREHENSIVE METABOLIC PANEL  ETHANOL  CBC WITH DIFFERENTIAL/PLATELET  URINALYSIS,  ROUTINE W REFLEX MICROSCOPIC  I-STAT TROPONIN, ED    EKG ED ECG REPORT   Date: 04/10/2017  Rate: 96  Rhythm: normal sinus rhythm  QRS Axis: right  Intervals: normal  ST/T Wave abnormalities: nonspecific T wave changes  Conduction Disutrbances:nonspecific intraventricular conduction delay  Narrative Interpretation:   Old EKG Reviewed: none available  I have personally reviewed the EKG tracing and agree with the computerized printout as noted.   Radiology No results found.  Procedures Procedures (including critical care time)  Medications Ordered in ED Medications - No data to display   Initial Impression / Assessment and Plan / ED Course  I have reviewed the triage vital signs and the nursing notes.  Pertinent labs & imaging results that were available during my care of the patient were reviewed by me and considered in my medical decision making (see chart for details).  Patient presents here with complaints of weakness, the etiology of which I am uncertain.  His laboratory studies are all consistent with baseline.  He does have pleural effusions which are old.  He is not complaining of any shortness of breath and he does not appear to be in any significant heart failure.  His EKG is unchanged and troponin is negative.  At this point I see no indication for further workup.  He will be discharged, to return as needed for any problems.  Final Clinical Impressions(s) / ED Diagnoses   Final diagnoses:  None    ED Discharge Orders    None       Geoffery Lyons, MD 04/10/17 [phone]

## 2017-04-13 DIAGNOSIS — I5022 Chronic systolic (congestive) heart failure: Secondary | ICD-10-CM | POA: Diagnosis not present

## 2017-04-13 DIAGNOSIS — M7981 Nontraumatic hematoma of soft tissue: Secondary | ICD-10-CM | POA: Diagnosis not present

## 2017-04-13 DIAGNOSIS — W19XXXA Unspecified fall, initial encounter: Secondary | ICD-10-CM | POA: Diagnosis not present

## 2017-04-13 DIAGNOSIS — J449 Chronic obstructive pulmonary disease, unspecified: Secondary | ICD-10-CM | POA: Diagnosis not present

## 2017-04-13 DIAGNOSIS — S0083XA Contusion of other part of head, initial encounter: Secondary | ICD-10-CM | POA: Diagnosis not present

## 2017-04-13 DIAGNOSIS — R2689 Other abnormalities of gait and mobility: Secondary | ICD-10-CM | POA: Diagnosis not present

## 2017-04-13 DIAGNOSIS — R278 Other lack of coordination: Secondary | ICD-10-CM | POA: Diagnosis not present

## 2017-04-13 DIAGNOSIS — R233 Spontaneous ecchymoses: Secondary | ICD-10-CM | POA: Diagnosis not present

## 2017-04-14 DIAGNOSIS — I5022 Chronic systolic (congestive) heart failure: Secondary | ICD-10-CM | POA: Diagnosis not present

## 2017-04-14 DIAGNOSIS — R278 Other lack of coordination: Secondary | ICD-10-CM | POA: Diagnosis not present

## 2017-04-14 DIAGNOSIS — R2689 Other abnormalities of gait and mobility: Secondary | ICD-10-CM | POA: Diagnosis not present

## 2017-04-15 DIAGNOSIS — I5022 Chronic systolic (congestive) heart failure: Secondary | ICD-10-CM | POA: Diagnosis not present

## 2017-04-15 DIAGNOSIS — R278 Other lack of coordination: Secondary | ICD-10-CM | POA: Diagnosis not present

## 2017-04-15 DIAGNOSIS — R2689 Other abnormalities of gait and mobility: Secondary | ICD-10-CM | POA: Diagnosis not present

## 2017-04-16 DIAGNOSIS — R278 Other lack of coordination: Secondary | ICD-10-CM | POA: Diagnosis not present

## 2017-04-16 DIAGNOSIS — I5022 Chronic systolic (congestive) heart failure: Secondary | ICD-10-CM | POA: Diagnosis not present

## 2017-04-16 DIAGNOSIS — R2689 Other abnormalities of gait and mobility: Secondary | ICD-10-CM | POA: Diagnosis not present

## 2017-04-17 DIAGNOSIS — R278 Other lack of coordination: Secondary | ICD-10-CM | POA: Diagnosis not present

## 2017-04-17 DIAGNOSIS — R2689 Other abnormalities of gait and mobility: Secondary | ICD-10-CM | POA: Diagnosis not present

## 2017-04-17 DIAGNOSIS — I5022 Chronic systolic (congestive) heart failure: Secondary | ICD-10-CM | POA: Diagnosis not present

## 2017-04-18 DIAGNOSIS — R0602 Shortness of breath: Secondary | ICD-10-CM | POA: Diagnosis not present

## 2017-04-19 DIAGNOSIS — R2689 Other abnormalities of gait and mobility: Secondary | ICD-10-CM | POA: Diagnosis not present

## 2017-04-19 DIAGNOSIS — I5022 Chronic systolic (congestive) heart failure: Secondary | ICD-10-CM | POA: Diagnosis not present

## 2017-04-19 DIAGNOSIS — R278 Other lack of coordination: Secondary | ICD-10-CM | POA: Diagnosis not present

## 2017-04-20 DIAGNOSIS — R2689 Other abnormalities of gait and mobility: Secondary | ICD-10-CM | POA: Diagnosis not present

## 2017-04-20 DIAGNOSIS — R278 Other lack of coordination: Secondary | ICD-10-CM | POA: Diagnosis not present

## 2017-04-20 DIAGNOSIS — I5022 Chronic systolic (congestive) heart failure: Secondary | ICD-10-CM | POA: Diagnosis not present

## 2017-04-21 DIAGNOSIS — R278 Other lack of coordination: Secondary | ICD-10-CM | POA: Diagnosis not present

## 2017-04-21 DIAGNOSIS — I5022 Chronic systolic (congestive) heart failure: Secondary | ICD-10-CM | POA: Diagnosis not present

## 2017-04-21 DIAGNOSIS — R2689 Other abnormalities of gait and mobility: Secondary | ICD-10-CM | POA: Diagnosis not present

## 2017-04-24 DIAGNOSIS — R2689 Other abnormalities of gait and mobility: Secondary | ICD-10-CM | POA: Diagnosis not present

## 2017-04-24 DIAGNOSIS — R278 Other lack of coordination: Secondary | ICD-10-CM | POA: Diagnosis not present

## 2017-04-24 DIAGNOSIS — I5022 Chronic systolic (congestive) heart failure: Secondary | ICD-10-CM | POA: Diagnosis not present

## 2017-04-25 DIAGNOSIS — R2689 Other abnormalities of gait and mobility: Secondary | ICD-10-CM | POA: Diagnosis not present

## 2017-04-25 DIAGNOSIS — R278 Other lack of coordination: Secondary | ICD-10-CM | POA: Diagnosis not present

## 2017-04-25 DIAGNOSIS — I5022 Chronic systolic (congestive) heart failure: Secondary | ICD-10-CM | POA: Diagnosis not present

## 2017-04-26 DIAGNOSIS — R2689 Other abnormalities of gait and mobility: Secondary | ICD-10-CM | POA: Diagnosis not present

## 2017-04-26 DIAGNOSIS — R278 Other lack of coordination: Secondary | ICD-10-CM | POA: Diagnosis not present

## 2017-04-26 DIAGNOSIS — I5022 Chronic systolic (congestive) heart failure: Secondary | ICD-10-CM | POA: Diagnosis not present

## 2017-04-27 DIAGNOSIS — R278 Other lack of coordination: Secondary | ICD-10-CM | POA: Diagnosis not present

## 2017-04-27 DIAGNOSIS — E119 Type 2 diabetes mellitus without complications: Secondary | ICD-10-CM | POA: Diagnosis not present

## 2017-04-27 DIAGNOSIS — I5022 Chronic systolic (congestive) heart failure: Secondary | ICD-10-CM | POA: Diagnosis not present

## 2017-04-27 DIAGNOSIS — R2689 Other abnormalities of gait and mobility: Secondary | ICD-10-CM | POA: Diagnosis not present

## 2017-04-28 DIAGNOSIS — I5022 Chronic systolic (congestive) heart failure: Secondary | ICD-10-CM | POA: Diagnosis not present

## 2017-04-28 DIAGNOSIS — R2689 Other abnormalities of gait and mobility: Secondary | ICD-10-CM | POA: Diagnosis not present

## 2017-04-28 DIAGNOSIS — R278 Other lack of coordination: Secondary | ICD-10-CM | POA: Diagnosis not present

## 2017-04-29 DIAGNOSIS — R278 Other lack of coordination: Secondary | ICD-10-CM | POA: Diagnosis not present

## 2017-04-29 DIAGNOSIS — R2689 Other abnormalities of gait and mobility: Secondary | ICD-10-CM | POA: Diagnosis not present

## 2017-04-29 DIAGNOSIS — I5022 Chronic systolic (congestive) heart failure: Secondary | ICD-10-CM | POA: Diagnosis not present

## 2017-05-01 DIAGNOSIS — R278 Other lack of coordination: Secondary | ICD-10-CM | POA: Diagnosis not present

## 2017-05-01 DIAGNOSIS — I5022 Chronic systolic (congestive) heart failure: Secondary | ICD-10-CM | POA: Diagnosis not present

## 2017-05-01 DIAGNOSIS — R2689 Other abnormalities of gait and mobility: Secondary | ICD-10-CM | POA: Diagnosis not present

## 2017-05-02 DIAGNOSIS — R2689 Other abnormalities of gait and mobility: Secondary | ICD-10-CM | POA: Diagnosis not present

## 2017-05-02 DIAGNOSIS — I5022 Chronic systolic (congestive) heart failure: Secondary | ICD-10-CM | POA: Diagnosis not present

## 2017-05-02 DIAGNOSIS — R278 Other lack of coordination: Secondary | ICD-10-CM | POA: Diagnosis not present

## 2017-05-03 DIAGNOSIS — I5022 Chronic systolic (congestive) heart failure: Secondary | ICD-10-CM | POA: Diagnosis not present

## 2017-05-03 DIAGNOSIS — R278 Other lack of coordination: Secondary | ICD-10-CM | POA: Diagnosis not present

## 2017-05-03 DIAGNOSIS — R2689 Other abnormalities of gait and mobility: Secondary | ICD-10-CM | POA: Diagnosis not present

## 2017-05-04 DIAGNOSIS — I10 Essential (primary) hypertension: Secondary | ICD-10-CM | POA: Diagnosis not present

## 2017-05-04 DIAGNOSIS — Z79899 Other long term (current) drug therapy: Secondary | ICD-10-CM | POA: Diagnosis not present

## 2017-05-04 DIAGNOSIS — R278 Other lack of coordination: Secondary | ICD-10-CM | POA: Diagnosis not present

## 2017-05-04 DIAGNOSIS — I5022 Chronic systolic (congestive) heart failure: Secondary | ICD-10-CM | POA: Diagnosis not present

## 2017-05-04 DIAGNOSIS — R2689 Other abnormalities of gait and mobility: Secondary | ICD-10-CM | POA: Diagnosis not present

## 2017-05-05 DIAGNOSIS — I5022 Chronic systolic (congestive) heart failure: Secondary | ICD-10-CM | POA: Diagnosis not present

## 2017-05-05 DIAGNOSIS — R2689 Other abnormalities of gait and mobility: Secondary | ICD-10-CM | POA: Diagnosis not present

## 2017-05-05 DIAGNOSIS — R278 Other lack of coordination: Secondary | ICD-10-CM | POA: Diagnosis not present

## 2017-05-06 DIAGNOSIS — I5022 Chronic systolic (congestive) heart failure: Secondary | ICD-10-CM | POA: Diagnosis not present

## 2017-05-06 DIAGNOSIS — R2689 Other abnormalities of gait and mobility: Secondary | ICD-10-CM | POA: Diagnosis not present

## 2017-05-06 DIAGNOSIS — R278 Other lack of coordination: Secondary | ICD-10-CM | POA: Diagnosis not present

## 2017-05-08 DIAGNOSIS — R2689 Other abnormalities of gait and mobility: Secondary | ICD-10-CM | POA: Diagnosis not present

## 2017-05-08 DIAGNOSIS — I5022 Chronic systolic (congestive) heart failure: Secondary | ICD-10-CM | POA: Diagnosis not present

## 2017-05-08 DIAGNOSIS — R278 Other lack of coordination: Secondary | ICD-10-CM | POA: Diagnosis not present

## 2017-05-11 DIAGNOSIS — I2699 Other pulmonary embolism without acute cor pulmonale: Secondary | ICD-10-CM | POA: Diagnosis not present

## 2017-05-11 DIAGNOSIS — I509 Heart failure, unspecified: Secondary | ICD-10-CM | POA: Diagnosis not present

## 2017-05-11 DIAGNOSIS — I2510 Atherosclerotic heart disease of native coronary artery without angina pectoris: Secondary | ICD-10-CM | POA: Diagnosis not present

## 2017-05-13 ENCOUNTER — Other Ambulatory Visit: Payer: Self-pay

## 2017-05-13 ENCOUNTER — Emergency Department (HOSPITAL_COMMUNITY): Payer: Medicare Other

## 2017-05-13 ENCOUNTER — Inpatient Hospital Stay (HOSPITAL_COMMUNITY): Payer: Medicare Other

## 2017-05-13 ENCOUNTER — Encounter (HOSPITAL_COMMUNITY): Payer: Self-pay

## 2017-05-13 ENCOUNTER — Inpatient Hospital Stay (HOSPITAL_COMMUNITY)
Admission: EM | Admit: 2017-05-13 | Discharge: 2017-05-26 | DRG: 682 | Disposition: E | Payer: Medicare Other | Attending: Internal Medicine | Admitting: Internal Medicine

## 2017-05-13 DIAGNOSIS — F17210 Nicotine dependence, cigarettes, uncomplicated: Secondary | ICD-10-CM | POA: Diagnosis present

## 2017-05-13 DIAGNOSIS — E1122 Type 2 diabetes mellitus with diabetic chronic kidney disease: Secondary | ICD-10-CM | POA: Diagnosis not present

## 2017-05-13 DIAGNOSIS — F418 Other specified anxiety disorders: Secondary | ICD-10-CM | POA: Diagnosis present

## 2017-05-13 DIAGNOSIS — I5084 End stage heart failure: Secondary | ICD-10-CM | POA: Diagnosis present

## 2017-05-13 DIAGNOSIS — E11649 Type 2 diabetes mellitus with hypoglycemia without coma: Secondary | ICD-10-CM | POA: Diagnosis present

## 2017-05-13 DIAGNOSIS — I10 Essential (primary) hypertension: Secondary | ICD-10-CM | POA: Diagnosis not present

## 2017-05-13 DIAGNOSIS — I5022 Chronic systolic (congestive) heart failure: Secondary | ICD-10-CM | POA: Diagnosis not present

## 2017-05-13 DIAGNOSIS — E8770 Fluid overload, unspecified: Secondary | ICD-10-CM | POA: Diagnosis not present

## 2017-05-13 DIAGNOSIS — I130 Hypertensive heart and chronic kidney disease with heart failure and stage 1 through stage 4 chronic kidney disease, or unspecified chronic kidney disease: Secondary | ICD-10-CM | POA: Diagnosis not present

## 2017-05-13 DIAGNOSIS — E039 Hypothyroidism, unspecified: Secondary | ICD-10-CM | POA: Diagnosis present

## 2017-05-13 DIAGNOSIS — T68XXXA Hypothermia, initial encounter: Secondary | ICD-10-CM | POA: Diagnosis present

## 2017-05-13 DIAGNOSIS — R82998 Other abnormal findings in urine: Secondary | ICD-10-CM | POA: Diagnosis not present

## 2017-05-13 DIAGNOSIS — Z515 Encounter for palliative care: Secondary | ICD-10-CM | POA: Diagnosis not present

## 2017-05-13 DIAGNOSIS — G9341 Metabolic encephalopathy: Secondary | ICD-10-CM | POA: Diagnosis not present

## 2017-05-13 DIAGNOSIS — R188 Other ascites: Secondary | ICD-10-CM | POA: Diagnosis present

## 2017-05-13 DIAGNOSIS — K922 Gastrointestinal hemorrhage, unspecified: Secondary | ICD-10-CM | POA: Diagnosis not present

## 2017-05-13 DIAGNOSIS — E43 Unspecified severe protein-calorie malnutrition: Secondary | ICD-10-CM | POA: Diagnosis present

## 2017-05-13 DIAGNOSIS — F329 Major depressive disorder, single episode, unspecified: Secondary | ICD-10-CM | POA: Diagnosis present

## 2017-05-13 DIAGNOSIS — D649 Anemia, unspecified: Secondary | ICD-10-CM | POA: Diagnosis present

## 2017-05-13 DIAGNOSIS — I428 Other cardiomyopathies: Secondary | ICD-10-CM | POA: Diagnosis not present

## 2017-05-13 DIAGNOSIS — Z794 Long term (current) use of insulin: Secondary | ICD-10-CM

## 2017-05-13 DIAGNOSIS — Z6826 Body mass index (BMI) 26.0-26.9, adult: Secondary | ICD-10-CM

## 2017-05-13 DIAGNOSIS — R2240 Localized swelling, mass and lump, unspecified lower limb: Secondary | ICD-10-CM | POA: Diagnosis not present

## 2017-05-13 DIAGNOSIS — Z7901 Long term (current) use of anticoagulants: Secondary | ICD-10-CM

## 2017-05-13 DIAGNOSIS — N179 Acute kidney failure, unspecified: Principal | ICD-10-CM | POA: Diagnosis present

## 2017-05-13 DIAGNOSIS — N189 Chronic kidney disease, unspecified: Secondary | ICD-10-CM | POA: Diagnosis not present

## 2017-05-13 DIAGNOSIS — N170 Acute kidney failure with tubular necrosis: Secondary | ICD-10-CM | POA: Diagnosis not present

## 2017-05-13 DIAGNOSIS — Z86711 Personal history of pulmonary embolism: Secondary | ICD-10-CM

## 2017-05-13 DIAGNOSIS — Z79899 Other long term (current) drug therapy: Secondary | ICD-10-CM

## 2017-05-13 DIAGNOSIS — J9811 Atelectasis: Secondary | ICD-10-CM | POA: Diagnosis present

## 2017-05-13 DIAGNOSIS — F0390 Unspecified dementia without behavioral disturbance: Secondary | ICD-10-CM | POA: Diagnosis present

## 2017-05-13 DIAGNOSIS — R4182 Altered mental status, unspecified: Secondary | ICD-10-CM | POA: Diagnosis present

## 2017-05-13 DIAGNOSIS — I252 Old myocardial infarction: Secondary | ICD-10-CM

## 2017-05-13 DIAGNOSIS — R609 Edema, unspecified: Secondary | ICD-10-CM | POA: Diagnosis present

## 2017-05-13 DIAGNOSIS — R0902 Hypoxemia: Secondary | ICD-10-CM | POA: Diagnosis not present

## 2017-05-13 DIAGNOSIS — I2510 Atherosclerotic heart disease of native coronary artery without angina pectoris: Secondary | ICD-10-CM | POA: Diagnosis not present

## 2017-05-13 DIAGNOSIS — R51 Headache: Secondary | ICD-10-CM | POA: Diagnosis not present

## 2017-05-13 DIAGNOSIS — J90 Pleural effusion, not elsewhere classified: Secondary | ICD-10-CM | POA: Diagnosis not present

## 2017-05-13 DIAGNOSIS — K625 Hemorrhage of anus and rectum: Secondary | ICD-10-CM | POA: Diagnosis not present

## 2017-05-13 DIAGNOSIS — K649 Unspecified hemorrhoids: Secondary | ICD-10-CM | POA: Diagnosis present

## 2017-05-13 DIAGNOSIS — Z66 Do not resuscitate: Secondary | ICD-10-CM | POA: Diagnosis present

## 2017-05-13 DIAGNOSIS — R1900 Intra-abdominal and pelvic swelling, mass and lump, unspecified site: Secondary | ICD-10-CM | POA: Diagnosis not present

## 2017-05-13 DIAGNOSIS — E872 Acidosis: Secondary | ICD-10-CM | POA: Diagnosis not present

## 2017-05-13 LAB — URINALYSIS, COMPLETE (UACMP) WITH MICROSCOPIC
BILIRUBIN URINE: NEGATIVE
Bacteria, UA: NONE SEEN
Glucose, UA: NEGATIVE mg/dL
Hgb urine dipstick: NEGATIVE
Ketones, ur: NEGATIVE mg/dL
LEUKOCYTES UA: NEGATIVE
NITRITE: NEGATIVE
Protein, ur: NEGATIVE mg/dL
RBC / HPF: NONE SEEN RBC/hpf (ref 0–5)
SQUAMOUS EPITHELIAL / LPF: NONE SEEN
Specific Gravity, Urine: 1.011 (ref 1.005–1.030)
WBC, UA: NONE SEEN WBC/hpf (ref 0–5)
pH: 5 (ref 5.0–8.0)

## 2017-05-13 LAB — COMPREHENSIVE METABOLIC PANEL
ALK PHOS: 87 U/L (ref 38–126)
ALT: 21 U/L (ref 17–63)
AST: 40 U/L (ref 15–41)
Albumin: 3 g/dL — ABNORMAL LOW (ref 3.5–5.0)
Anion gap: 14 (ref 5–15)
BUN: 46 mg/dL — ABNORMAL HIGH (ref 6–20)
CALCIUM: 8.5 mg/dL — AB (ref 8.9–10.3)
CO2: 17 mmol/L — AB (ref 22–32)
Chloride: 110 mmol/L (ref 101–111)
Creatinine, Ser: 2.28 mg/dL — ABNORMAL HIGH (ref 0.61–1.24)
GFR calc non Af Amer: 29 mL/min — ABNORMAL LOW (ref >60)
GFR, EST AFRICAN AMERICAN: 34 mL/min — AB (ref >60)
GLUCOSE: 83 mg/dL (ref 65–99)
Potassium: 4.5 mmol/L (ref 3.5–5.1)
SODIUM: 141 mmol/L (ref 135–145)
Total Bilirubin: 0.8 mg/dL (ref 0.3–1.2)
Total Protein: 7.3 g/dL (ref 6.5–8.1)

## 2017-05-13 LAB — I-STAT VENOUS BLOOD GAS, ED
ACID-BASE DEFICIT: 5 mmol/L — AB (ref 0.0–2.0)
BICARBONATE: 19.4 mmol/L — AB (ref 20.0–28.0)
O2 Saturation: 54 %
TCO2: 20 mmol/L — AB (ref 22–32)
pCO2, Ven: 32.4 mmHg — ABNORMAL LOW (ref 44.0–60.0)
pH, Ven: 7.385 (ref 7.250–7.430)
pO2, Ven: 29 mmHg — CL (ref 32.0–45.0)

## 2017-05-13 LAB — POC OCCULT BLOOD, ED: Fecal Occult Bld: POSITIVE — AB

## 2017-05-13 LAB — RAPID URINE DRUG SCREEN, HOSP PERFORMED
AMPHETAMINES: NOT DETECTED
Barbiturates: NOT DETECTED
Benzodiazepines: NOT DETECTED
COCAINE: NOT DETECTED
OPIATES: NOT DETECTED
Tetrahydrocannabinol: NOT DETECTED

## 2017-05-13 LAB — ETHANOL: Alcohol, Ethyl (B): <10 mg/dL (ref <10)

## 2017-05-13 LAB — CBC
HCT: 34 % — ABNORMAL LOW (ref 39.0–52.0)
Hemoglobin: 11.5 g/dL — ABNORMAL LOW (ref 13.0–17.0)
MCH: 29.2 pg (ref 26.0–34.0)
MCHC: 33.8 g/dL (ref 30.0–36.0)
MCV: 86.3 fL (ref 78.0–100.0)
Platelets: 208 10*3/uL (ref 150–400)
RBC: 3.94 MIL/uL — AB (ref 4.22–5.81)
RDW: 15.3 % (ref 11.5–15.5)
WBC: 5.1 10*3/uL (ref 4.0–10.5)

## 2017-05-13 LAB — TYPE AND SCREEN
ABO/RH(D): A POS
Antibody Screen: NEGATIVE

## 2017-05-13 LAB — ABO/RH: ABO/RH(D): A POS

## 2017-05-13 LAB — AMMONIA: Ammonia: 51 umol/L — ABNORMAL HIGH (ref 9–35)

## 2017-05-13 MED ORDER — LORAZEPAM 2 MG/ML IJ SOLN
0.5000 mg | Freq: Once | INTRAMUSCULAR | Status: DC
  Filled 2017-05-13: qty 1

## 2017-05-13 MED ORDER — DIGOXIN 125 MCG PO TABS
0.1250 mg | ORAL_TABLET | Freq: Every day | ORAL | Status: DC
  Administered 2017-05-14: 0.125 mg via ORAL
  Filled 2017-05-13 (×2): qty 1

## 2017-05-13 MED ORDER — HYDRALAZINE HCL 25 MG PO TABS: 25.0000 mg | ORAL_TABLET | Freq: Three times a day (TID) | ORAL | Status: DC

## 2017-05-13 MED ORDER — OXYCODONE HCL 5 MG PO TABS: 5.0000 mg | ORAL_TABLET | Freq: Four times a day (QID) | ORAL | Status: DC | PRN

## 2017-05-13 MED ORDER — LACTULOSE 10 GM/15ML PO SOLN
30.0000 g | Freq: Once | ORAL | Status: DC
  Filled 2017-05-13 (×2): qty 45

## 2017-05-13 MED ORDER — CARVEDILOL 3.125 MG PO TABS
3.1250 mg | ORAL_TABLET | Freq: Two times a day (BID) | ORAL | Status: DC
  Administered 2017-05-14 – 2017-05-17 (×7): 3.125 mg via ORAL
  Filled 2017-05-13 (×8): qty 1

## 2017-05-13 MED ORDER — RIVAROXABAN 20 MG PO TABS
20.0000 mg | ORAL_TABLET | Freq: Every day | ORAL | Status: DC
  Filled 2017-05-13: qty 1

## 2017-05-13 MED ORDER — ENSURE ENLIVE PO LIQD
237.0000 mL | Freq: Three times a day (TID) | ORAL | Status: DC
  Administered 2017-05-14 – 2017-05-18 (×11): 237 mL via ORAL

## 2017-05-13 MED ORDER — PANTOPRAZOLE SODIUM 40 MG IV SOLR
40.0000 mg | Freq: Two times a day (BID) | INTRAVENOUS | Status: DC
  Administered 2017-05-14 – 2017-05-19 (×10): 40 mg via INTRAVENOUS
  Filled 2017-05-13 (×11): qty 40

## 2017-05-13 MED ORDER — ACETAMINOPHEN 325 MG PO TABS: 650.0000 mg | ORAL_TABLET | Freq: Four times a day (QID) | ORAL | Status: DC | PRN

## 2017-05-13 MED ORDER — BUPROPION HCL ER (XL) 150 MG PO TB24: 150.0000 mg | ORAL_TABLET | Freq: Every day | ORAL | Status: DC

## 2017-05-13 MED ORDER — RIVAROXABAN 20 MG PO TABS: 20.0000 mg | ORAL_TABLET | Freq: Every day | ORAL | Status: DC

## 2017-05-13 MED ORDER — ACETAMINOPHEN 650 MG RE SUPP: 650.0000 mg | Freq: Four times a day (QID) | RECTAL | Status: DC | PRN

## 2017-05-13 MED ORDER — SODIUM BICARBONATE 650 MG PO TABS
650.0000 mg | ORAL_TABLET | Freq: Two times a day (BID) | ORAL | Status: DC
  Administered 2017-05-14 – 2017-05-17 (×7): 650 mg via ORAL
  Filled 2017-05-13 (×7): qty 1

## 2017-05-13 MED ORDER — PRO-STAT SUGAR FREE PO LIQD
30.0000 mL | Freq: Two times a day (BID) | ORAL | Status: DC
  Administered 2017-05-14 – 2017-05-17 (×7): 30 mL via ORAL
  Filled 2017-05-13 (×7): qty 30

## 2017-05-13 MED ORDER — ISOSORBIDE MONONITRATE ER 30 MG PO TB24
30.0000 mg | ORAL_TABLET | Freq: Every day | ORAL | Status: DC
  Administered 2017-05-14 – 2017-05-19 (×5): 30 mg via ORAL
  Filled 2017-05-13 (×6): qty 1

## 2017-05-13 MED ORDER — ATORVASTATIN CALCIUM 20 MG PO TABS
20.0000 mg | ORAL_TABLET | Freq: Every day | ORAL | Status: DC
  Administered 2017-05-14 – 2017-05-16 (×3): 20 mg via ORAL
  Filled 2017-05-13 (×3): qty 1

## 2017-05-13 MED ORDER — SERTRALINE HCL 100 MG PO TABS
100.0000 mg | ORAL_TABLET | Freq: Every day | ORAL | Status: DC
  Administered 2017-05-14 – 2017-05-17 (×4): 100 mg via ORAL
  Filled 2017-05-13 (×5): qty 1

## 2017-05-13 MED ORDER — SODIUM CHLORIDE 0.9% FLUSH: 3.0000 mL | INTRAVENOUS | Status: DC | PRN

## 2017-05-13 MED ORDER — SODIUM CHLORIDE 0.9 % IV SOLN: 250.0000 mL | INTRAVENOUS | Status: DC | PRN

## 2017-05-13 MED ORDER — ARIPIPRAZOLE 5 MG PO TABS
5.0000 mg | ORAL_TABLET | Freq: Every day | ORAL | Status: DC
  Filled 2017-05-13: qty 1

## 2017-05-13 MED ORDER — SODIUM CHLORIDE 0.9% FLUSH
3.0000 mL | Freq: Two times a day (BID) | INTRAVENOUS | Status: DC
  Administered 2017-05-14 – 2017-05-22 (×17): 3 mL via INTRAVENOUS

## 2017-05-13 NOTE — H&P (Signed)
TRH H&P   Patient Demographics:    [name], is a 62 y.o. male  MRN: [id number]   DOB - 08-Sep-1955  Admit Date - 05/19/2017  Outpatient Primary MD for the patient is McCoole at [facility]  Referring MD/NP/PA:   Lilian Coma  Outpatient Specialists:   Patient coming from:   [facility]  Chief Complaint  Patient presents with  . GI Bleeding      HPI:    [name]  is a 62 y.o. male, w CHF(EF 10-15%), CAD, Dm2, Hypertension, hx of prostate abscess s/p drainage, 02/18/2017,  ? Dementia, apparently has had altered mental status per sister over the past 1 day.   Pt has also had slight increase in lower ext edema over the past 1 month and rectal bleeding starting today.  Pt does have history of hemorrhoids,  His sister can't recall if he had has a colonoscopy in the past   Pt denies fever, chills, cp, palp, sob, n/v, abd pain, diarrhea, black stool.  Pt was brought in for rectal bleeding on toilet paper and in toilet per his sister.    In ED,  Wbc 5.1, Hgb 11.5, Plt 208 Na 141, K 4.5, Hco3 17, Bun 46, Creatinine 2.28 Albumin 3.0 Ast 40, Alt 21  Urinalysis negative  Ammonia 51  ABG ph 7.385, Co2 32.4, O2 29 (venous)  CXR IMPRESSION: 1. Low lung volumes with small to moderate bilateral pleural effusions and associated areas of atelectasis and/or consolidation in the lung bases bilaterally, very similar to prior study from 04/10/2017.  CT brain IMPRESSION: 1. No acute finding or explanation for headache. 2. Atrophy and ischemic injury that has progressed from 2010.   Pt will be admitted for AMS and rectal bleeding and increase in lower ext edema.    Review of systems:    In addition to the HPI above,  Unable to obtain due to pt being poor historian   With Past History of the following :    Past Medical History:    Diagnosis Date  . CHF (congestive heart failure) (HCC)    EF 10-15%  . Coronary artery disease   . Depression   . Diabetes mellitus   . Hypertension   . Mental disorder   . MI (myocardial infarction) ([place]) 60   age 34 per patient      Past Surgical History:  Procedure Laterality Date  . ANGIOPLASTY  1984   at age 89  . IR CATHETER TUBE CHANGE  02/27/2017  . LEFT AND RIGHT HEART CATHETERIZATION WITH CORONARY/GRAFT ANGIOGRAM  08/13/2012   Procedure: LEFT AND RIGHT HEART CATHETERIZATION WITH [name];  Surgeon: Burnell Blanks, MD;  Location: [facility] CATH LAB;  Service: Cardiovascular;;      Social History:     Social History   Tobacco Use  . Smoking status:  Current Every Day Smoker    Packs/day: 0.50    Years: 40.00    Pack years: 20.00    Types: Cigarettes  . Smokeless tobacco: Never Used  Substance Use Topics  . Alcohol use: Yes    Comment: 4 40 oz beers daily     Lives - at [place] - unclear   Family History :     Family History  Problem Relation Age of Onset  . Heart attack Father        >60s  . Hypertension Father   . Diabetes Father   . Heart attack Sister   . Hypertension Mother       Home Medications:   Prior to Admission medications   Medication Sig Start Date End Date Taking? Authorizing Provider  acetaminophen (TYLENOL) 325 MG tablet Take 650 mg by mouth every 6 (six) hours as needed for mild pain.    [provider]  Amino Acids-Protein Hydrolys (FEEDING SUPPLEMENT, PRO-STAT SUGAR FREE 64,) LIQD Take 30 mLs 2 (two) times daily by mouth. 02/27/17   Debbe Odea, MD  ARIPiprazole (ABILIFY) 10 MG tablet Take 5 mg by mouth daily.    [provider]  atorvastatin (LIPITOR) 20 MG tablet Take 1 tablet (20 mg total) by mouth daily at 6 PM. 09/21/16   Shirley Friar, PA-C  buPROPion (WELLBUTRIN XL) 150 MG 24 hr tablet Take 1 tablet (150 mg total) by mouth daily. 12/06/16   Thomasene Ripple, MD  carvedilol (COREG) 3.125 MG tablet Take 1 tablet (3.125 mg total) by mouth 2 (two) times daily. 12/06/16 12/06/17  Maryellen Pile, MD  digoxin (LANOXIN) 0.125 MG tablet Take 1 tablet (0.125 mg total) by mouth daily. 09/21/16   Shirley Friar, PA-C  feeding supplement, ENSURE ENLIVE, (ENSURE ENLIVE) LIQD Take 237 mLs 3 (three) times daily between meals by mouth. 02/27/17   Debbe Odea, MD  furosemide (LASIX) 40 MG tablet Take 1 tablet (40 mg total) daily as needed by mouth. For weight gain of > 3 lb in 24-48 hr period 02/27/17   Debbe Odea, MD  hydrALAZINE (APRESOLINE) 25 MG tablet Take 1 tablet (25 mg total) by mouth 3 (three) times daily. 09/21/16   Shirley Friar, PA-C  insulin glargine (LANTUS) 100 UNIT/ML injection Inject 0.1 mLs (10 Units total) daily into the skin. 02/28/17   Debbe Odea, MD  isosorbide mononitrate (IMDUR) 30 MG 24 hr tablet Take 1 tablet (30 mg total) by mouth daily. 09/21/16   Shirley Friar, PA-C  metFORMIN (GLUCOPHAGE) 1000 MG tablet Take 1 tablet (1,000 mg total) by mouth 2 (two) times daily with a meal. For high blood sugar control 08/13/16   Rosita Fire, MD  oxyCODONE (OXY IR/ROXICODONE) 5 MG immediate release tablet Take 1 tablet (5 mg total) every 6 (six) hours as needed by mouth for moderate pain. 02/28/17   Debbe Odea, MD  rivaroxaban (XARELTO) 20 MG TABS tablet Take 1 tablet (20 mg total) by mouth daily with supper. 12/26/16   Thomasene Ripple, MD  sertraline (ZOLOFT) 100 MG tablet Take 100 mg by mouth at bedtime.     [provider]  sodium bicarbonate 650 MG tablet Take 1 tablet (650 mg total) 2 (two) times daily by mouth. 02/27/17   Debbe Odea, MD  sulfamethoxazole-trimethoprim (BACTRIM DS,SEPTRA DS) 800-160 MG tablet Take 1 tablet every 12 (twelve) hours by mouth. 02/27/17   Debbe Odea, MD  traZODone (DESYREL) 100 MG tablet Take 1  tablet (100 mg total) by mouth at bedtime. For sleep 12/27/12   Lindell Spar I, NP     Allergies:    No Known Allergies   Physical Exam:   Vitals  Blood pressure 118/82, pulse 82, temperature 97.6 F (36.4 C), resp. rate 18, SpO2 100 %.   1. General  lying in bed in NAD,    2. Normal affect and insight, Not Suicidal or Homicidal, Awake Alert, Oriented X 3.  3. No F.N deficits, ALL C.Nerves Intact, Strength 5/5 all 4 extremities, Sensation intact all 4 extremities, Plantars down going.  4. Ears and Eyes appear Normal, Conjunctivae clear, PERRLA. Moist Oral Mucosa.  5. Supple Neck, No JVD, No cervical lymphadenopathy appriciated, No Carotid Bruits.  6. Symmetrical Chest wall movement, Good air movement bilaterally, CTAB.  7. RRR,s1, s2, 1/6 sem apex  8. Positive Bowel Sounds, Abdomen Soft, No tenderness, No organomegaly appriciated,No rebound -guarding or rigidity.  9.  No Cyanosis,1+ bilateral lower ext edema.   10. Good muscle tone,  joints appear normal , no effusions, Normal ROM.  11. No Palpable Lymph Nodes in Neck or Axillae   Data Review:    CBC Recent Labs  Lab 05/16/2017 1550  WBC 5.1  HGB 11.5*  HCT 34.0*  PLT 208  MCV 86.3  MCH 29.2  MCHC 33.8  RDW 15.3   ------------------------------------------------------------------------------------------------------------------  Chemistries  Recent Labs  Lab 05/02/2017 1550  NA 141  K 4.5  CL 110  CO2 17*  GLUCOSE 83  BUN 46*  CREATININE 2.28*  CALCIUM 8.5*  AST 40  ALT 21  ALKPHOS 87  BILITOT 0.8   ------------------------------------------------------------------------------------------------------------------ CrCl cannot be calculated (Unknown ideal weight.). ------------------------------------------------------------------------------------------------------------------ No results for input(s): TSH, T4TOTAL, T3FREE, THYROIDAB in the last 72 hours.  Invalid input(s): FREET3  Coagulation profile No results for input(s): INR, PROTIME in the last 168  hours. ------------------------------------------------------------------------------------------------------------------- No results for input(s): DDIMER in the last 72 hours. -------------------------------------------------------------------------------------------------------------------  Cardiac Enzymes No results for input(s): CKMB, TROPONINI, MYOGLOBIN in the last 168 hours.  Invalid input(s): CK ------------------------------------------------------------------------------------------------------------------    Component Value Date/Time   BNP 1,958.9 (H) 12/04/2016 0609     ---------------------------------------------------------------------------------------------------------------  Urinalysis    Component Value Date/Time   COLORURINE YELLOW 05/17/2017 1615   APPEARANCEUR HAZY (A) 05/25/2017 1615   LABSPEC 1.011 05/01/2017 1615   PHURINE 5.0 05/15/2017 1615   GLUCOSEU NEGATIVE 05/25/2017 1615   HGBUR NEGATIVE 05/25/2017 1615   BILIRUBINUR NEGATIVE 05/17/2017 1615   KETONESUR NEGATIVE 05/09/2017 1615   PROTEINUR NEGATIVE 05/05/2017 1615   UROBILINOGEN 2.0 (H) 02/20/2014 1339   NITRITE NEGATIVE 05/22/2017 1615   LEUKOCYTESUR NEGATIVE 05/11/2017 1615    ----------------------------------------------------------------------------------------------------------------   Imaging Results:    Dg Chest 2 View  Result Date: 05/25/2017 CLINICAL DATA:  62 year old male with history of altered mental status. Rectal bleeding. Lethargy. EXAM: CHEST  2 VIEW COMPARISON:  Chest x-ray 04/10/2017. FINDINGS: Lung volumes are low. Bibasilar opacities (right greater than left), similar to the prior study, which may reflect areas of atelectasis and/or airspace consolidation, with superimposed small to moderate bilateral pleural effusions. No evidence of pulmonary edema. Heart size appears borderline enlarged. Upper mediastinal contours are within normal limits. IMPRESSION: 1. Low lung  volumes with small to moderate bilateral pleural effusions and associated areas of atelectasis and/or consolidation in the lung bases bilaterally, very similar to prior study from 04/10/2017. Electronically Signed   By: Vinnie Langton M.D.   On: 05/25/2017 17:23   Ct Head Wo  Contrast  Result Date: 04/27/2017 CLINICAL DATA:  Headache EXAM: CT HEAD WITHOUT CONTRAST TECHNIQUE: Contiguous axial images were obtained from the base of the skull through the vertex without intravenous contrast. COMPARISON:  10/23/2008 FINDINGS: Brain: No evidence of acute infarction, hemorrhage, hydrocephalus, extra-axial collection or mass lesion/mass effect. Moderate generalized atrophy. Moderate low-density in the cerebral white matter, occipital parietal predominant and compatible with chronic small vessel ischemia given patient's risk factors. Left superior temporal gliosis that is likely post ischemic. Vascular: Atherosclerotic calcification.  No hyperdense vessel. Skull: No acute or aggressive finding. Sinuses/Orbits: Negative IMPRESSION: 1. No acute finding or explanation for headache. 2. Atrophy and ischemic injury that has progressed from 2010. Electronically Signed   By: Monte Fantasia M.D.   On: 04/30/2017 17:56      Assessment & Plan:    Principal Problem:   ARF (acute renal failure) (HCC) Active Problems:   Cardiomyopathy, nonischemic (HCC)   Rectal bleeding   Edema    Rectal bleeding h/o proctitis ? hemorrhoids NPO protonix 20m iv bid Cbc in am Please call GI consult in am  Edema ? Severe protein calorie malnutrition vs CM (EF 10-15%) Lasix iv  ARF ? Bactrim Check urine sodium, urine creatinine , urine eosinophils Check renal ultrasound  AMS Check b12, folate, esr, rpr,  Lactulose 30gm po x1 (given in ED) Check ammonia in am  Dm2 STOP Metformin due to ARF fsbs ac and qhs, ISS  CHF (EF 167-61%, chronic systolic CHF Cont carvedilol Cont digoxin Cont Imdur Cont  hydralazine  PE Xarelto, pharmacy to dose  CAD Cont lipitor Cont carvedilol Cont imdur  ? Depression Cont zoloft Cont wellbutrin Cont abilify      DVT Prophylaxis Xarelto pharmacy to dose - SCDs   AM Labs Ordered, also please review Full Orders  Family Communication: Admission, patients condition and plan of care including tests being ordered have been discussed with the patient sister  who indicate understanding and agree with the plan and Code Status.  Code Status FULL CODE  Likely DC to  home  Condition GUARDED  Consults called: none  Admission status: inpatient   Time spent in minutes : 45   JJani GravelM.D on 05/03/2017 at 8:04 PM  Between 7am to 7pm - Pager - [phone] After 7pm go to [email or web address] - password [email or web address] - Office  [phone]

## 2017-05-13 NOTE — ED Notes (Signed)
Pt unable to perform orthostatic VS; does not sit or stand

## 2017-05-13 NOTE — Progress Notes (Signed)
Patient unable to safely pass a swallow evaluation due to sedation.  Admitting doctor notified of patient not able to take night time meds because of sedation. Physician said okay to hold night time meds and reassess swallow evaluation when patient more alert.  Will continue to monitor.

## 2017-05-13 NOTE — Progress Notes (Addendum)
ANTICOAGULATION CONSULT NOTE  Pharmacy Consult for Xarelto Indication: pulmonary embolus  Labs: Recent Labs    05/17/2017 1550  HGB 11.5*  HCT 34.0*  PLT 208  CREATININE 2.28*    Assessment: 61 yom with PE on Xarelto PTA - last dose 1/18 PTA. Pharmacy consulted to dose inpatient. Rectal bleeding noted with hx of proctitis, hemorrhoids per MD note. GI to see in AM, protonix IV BID started. Hg 11.5, plt wnl. SCr up to 2.28 on admit, CrCl~30-35. CTA head neg.  Goal of Therapy:  VTE treatment/prevention Monitor platelets by anticoagulation protocol: Yes   Plan:  Continue Xarelto 20mg  PO Qsupper Monitor CBC, SCr, s/sx bleeding  Babs Bertin, PharmD, BCPS Clinical Pharmacist 04/30/2017 8:57 PM

## 2017-05-13 NOTE — ED Provider Notes (Signed)
[facility] EMERGENCY DEPARTMENT Provider Note   CSN: [id number] Arrival date & time: 05/07/2017  1534     History   Chief Complaint Chief Complaint  Patient presents with  . GI Bleeding    HPI [name] is a 62 y.o. male.  Patient coming from nursing facility due to altered mental status and episode of rectal bleeding.  History of hemorrhoids.  He had 2 episodes of bleeding - both after defecation.  Mental status baseline is alert and conversant but with difficulty with speech. Today sleepy and having bizarre behavior.    Altered Mental Status   This is a new problem. The current episode started 12 to 24 hours ago. Progression since onset: waxing and waning. Associated symptoms include confusion, somnolence and agitation. Pertinent negatives include no seizures. Risk factors include alcohol intake and illicit drug use (by history, facility doubts recent use). His past medical history is significant for liver disease.    Past Medical History:  Diagnosis Date  . CHF (congestive heart failure) (HCC)   . Coronary artery disease   . Depression   . Diabetes mellitus   . Hypertension   . Mental disorder   . MI (myocardial infarction) [facility]) 68   age 69 per patient    Patient Active Problem List   Diagnosis Date Noted  . Prostate abscess 02/18/2017  . AKI (acute kidney injury) (HCC) 02/18/2017  . Myocardial ischemia   . Pulmonary embolus (HCC)   . Cocaine abuse (HCC)   . H/O noncompliance with medical treatment, presenting hazards to health 12/04/2016  . Chronic systolic CHF (congestive heart failure) (HCC) 12/04/2016  . Anxiety state   . Depression with anxiety 08/05/2016  . DNR (do not resuscitate) discussion   . Palliative care encounter   . Polysubstance abuse (HCC)   . Type 2 diabetes mellitus with complication (HCC)   . Hypertensive heart disease with chronic systolic congestive heart failure (HCC)   . 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  Hyponatremia 02/23/2014  . Alcohol abuse 12/21/2012  . Cocaine dependence (HCC) 12/21/2012  . Substance induced mood disorder (HCC) 12/21/2012  . Coronary artery disease 08/14/2012  . Cardiomyopathy, nonischemic (HCC) 08/14/2012    Past Surgical History:  Procedure Laterality Date  . ANGIOPLASTY  1984   at age 72  . IR CATHETER TUBE CHANGE  02/27/2017  . LEFT AND RIGHT HEART CATHETERIZATION WITH CORONARY/GRAFT ANGIOGRAM  08/13/2012   Procedure: LEFT AND RIGHT HEART CATHETERIZATION WITH [name];  Surgeon: Kathleene Hazel, MD;  Location: [facility] CATH LAB;  Service: Cardiovascular;;       Home Medications    Prior to Admission medications   Medication Sig Start Date End Date Taking? Authorizing Provider  acetaminophen (TYLENOL) 325 MG tablet Take 650 mg by mouth every 6 (six) hours as needed for mild pain.    [provider]  Amino Acids-Protein Hydrolys (FEEDING SUPPLEMENT, PRO-STAT SUGAR FREE 64,) LIQD Take 30 mLs 2 (two) times daily by mouth. 02/27/17   Calvert Cantor, MD  ARIPiprazole (ABILIFY) 10 MG tablet Take 5 mg by mouth daily.    [provider]  atorvastatin (LIPITOR) 20 MG tablet Take 1 tablet (20 mg total) by mouth daily at 6 PM. 09/21/16   Graciella Freer, PA-C  buPROPion (WELLBUTRIN XL) 150 MG 24 hr tablet Take 1 tablet (150 mg total) by mouth daily. 12/06/16   Rozann Lesches, MD  carvedilol (COREG) 3.125 MG tablet Take 1 tablet (3.125 mg total)  by mouth 2 (two) times daily. 12/06/16 12/06/17  Valentino Nose, MD  digoxin (LANOXIN) 0.125 MG tablet Take 1 tablet (0.125 mg total) by mouth daily. 09/21/16   Graciella Freer, PA-C  feeding supplement, ENSURE ENLIVE, (ENSURE ENLIVE) LIQD Take 237 mLs 3 (three) times daily between meals by mouth. 02/27/17   Calvert Cantor, MD  furosemide (LASIX) 40 MG tablet Take 1 tablet (40 mg total) daily as needed by mouth. For weight gain of > 3 lb in 24-48 hr period 02/27/17   Calvert Cantor, MD    hydrALAZINE (APRESOLINE) 25 MG tablet Take 1 tablet (25 mg total) by mouth 3 (three) times daily. 09/21/16   Graciella Freer, PA-C  insulin glargine (LANTUS) 100 UNIT/ML injection Inject 0.1 mLs (10 Units total) daily into the skin. 02/28/17   Calvert Cantor, MD  isosorbide mononitrate (IMDUR) 30 MG 24 hr tablet Take 1 tablet (30 mg total) by mouth daily. 09/21/16   Graciella Freer, PA-C  metFORMIN (GLUCOPHAGE) 1000 MG tablet Take 1 tablet (1,000 mg total) by mouth 2 (two) times daily with a meal. For high blood sugar control 08/13/16   Maxie Barb, MD  oxyCODONE (OXY IR/ROXICODONE) 5 MG immediate release tablet Take 1 tablet (5 mg total) every 6 (six) hours as needed by mouth for moderate pain. 02/28/17   Calvert Cantor, MD  rivaroxaban (XARELTO) 20 MG TABS tablet Take 1 tablet (20 mg total) by mouth daily with supper. 12/26/16   Rozann Lesches, MD  sertraline (ZOLOFT) 100 MG tablet Take 100 mg by mouth at bedtime.     [provider]  sodium bicarbonate 650 MG tablet Take 1 tablet (650 mg total) 2 (two) times daily by mouth. 02/27/17   Calvert Cantor, MD  sulfamethoxazole-trimethoprim (BACTRIM DS,SEPTRA DS) 800-160 MG tablet Take 1 tablet every 12 (twelve) hours by mouth. 02/27/17   Calvert Cantor, MD  traZODone (DESYREL) 100 MG tablet Take 1 tablet (100 mg total) by mouth at bedtime. For sleep 12/27/12   Sanjuana Kava, NP    Family History Family History  Problem Relation Age of Onset  . Heart attack Father        >60s  . Hypertension Father   . Diabetes Father   . Heart attack Sister   . Hypertension Mother     Social History Social History   Tobacco Use  . Smoking status: Current Every Day Smoker    Packs/day: 0.50    Years: 40.00    Pack years: 20.00    Types: Cigarettes  . Smokeless tobacco: Never Used  Substance Use Topics  . Alcohol use: Yes    Comment: 4 40 oz beers daily  . Drug use: Yes    Frequency: 7.0 times per week    Types: Marijuana,  "Crack" cocaine    Comment: currently using     Allergies   Patient has no known allergies.   Review of Systems Review of Systems  Constitutional: Negative for chills and fever.  HENT: Negative for ear pain and sore throat.   Eyes: Negative for pain and visual disturbance.  Respiratory: Negative for cough and shortness of breath.   Cardiovascular: Negative for chest pain and palpitations.  Gastrointestinal: Positive for anal bleeding. Negative for abdominal pain and vomiting.  Genitourinary: Negative for dysuria and hematuria.  Musculoskeletal: Negative for arthralgias and back pain.  Skin: Negative for color change and rash.  Neurological: Negative for seizures and syncope.  Psychiatric/Behavioral: Positive for agitation and confusion.  All other systems reviewed and are negative.    Physical Exam Updated Vital Signs Pulse 80   Temp 97.6 F (36.4 C)   Resp 18   SpO2 100%   Physical Exam  Constitutional: He appears well-developed and well-nourished.  HENT:  Head: Normocephalic and atraumatic.  Eyes: Conjunctivae are normal. Pupils are equal, round, and reactive to light.  Neck: Normal range of motion. Neck supple.  Cardiovascular: Normal rate and regular rhythm.  No murmur heard. Pulmonary/Chest: Effort normal and breath sounds normal. No respiratory distress.  Abdominal: He exhibits distension. There is no tenderness.  Pitting edema to abdomen  Musculoskeletal: He exhibits edema (2+ pitting edema BLE).  Neurological: He is alert.  Sleepy, arousable  Skin: Skin is warm and dry.  Psychiatric: He has a normal mood and affect.  Nursing note and vitals reviewed.    ED Treatments / Results  Labs (all labs ordered are listed, but only abnormal results are displayed) Labs Reviewed  COMPREHENSIVE METABOLIC PANEL  CBC  POC OCCULT BLOOD, ED  TYPE AND SCREEN    EKG  EKG Interpretation None       Radiology No results found.  Procedures Procedures  (including critical care time)  Medications Ordered in ED Medications - No data to display   Initial Impression / Assessment and Plan / ED Course  I have reviewed the triage vital signs and the nursing notes.  Pertinent labs & imaging results that were available during my care of the patient were reviewed by me and considered in my medical decision making (see chart for details).     [name] is a 61 year old male with a past medical history significant for prostate abscess, AK I, MI, PE, polysubstance abuse, CHF, depression, diabetes, HCV, CAD, cardiomyopathy who presents for altered mental status and 2 episodes of rectal bleeding.  Rectal exam performed and demonstrates no active bleeding or melena but is Hemoccult positive.  Of note the scrotum skin is breaking down with small bleeding.  EKG obtained and demonstrates nonspecific T wave changes.  Chest x-ray obtained, personally reviewed by me, demonstrates low lung volume with moderate bilateral pleural effusion and atelectasis similar to prior.  CT head obtained and demonstrates no acute processes consistent with presentation.  Labs obtained and significant findings include hyperammonemia, negative UDS, elevated creatinine and BUN, anemia baseline.  Lactulose given. IVF held 2/2 anasarca.  Patient is admitted to hospitalist.   Final Clinical Impressions(s) / ED Diagnoses   Final diagnoses:  ARF (acute renal failure) (HCC)  Altered mental status  ED Discharge Orders    None       Garey Ham, MD 05/14/17 2694    Blane Ohara, MD 05/14/17 1552

## 2017-05-13 NOTE — ED Triage Notes (Addendum)
Pt rec'd via EMS from [facility] facility w c/o rectal bleeding 15 mins after BM. 147/95; 79; 97 O2 on RA

## 2017-05-14 LAB — GLUCOSE, CAPILLARY
Glucose-Capillary: 67 mg/dL (ref 65–99)
Glucose-Capillary: 168 mg/dL — ABNORMAL HIGH (ref 65–99)
Glucose-Capillary: 139 mg/dL — ABNORMAL HIGH (ref 65–99)

## 2017-05-14 LAB — LACTIC ACID, PLASMA
LACTIC ACID, VENOUS: 1.1 mmol/L (ref 0.5–1.9)
LACTIC ACID, VENOUS: 1.2 mmol/L (ref 0.5–1.9)

## 2017-05-14 LAB — SEDIMENTATION RATE: SED RATE: 40 mm/h — AB (ref 0–16)

## 2017-05-14 LAB — CBC
HEMATOCRIT: 32.7 % — AB (ref 39.0–52.0)
HEMOGLOBIN: 10.9 g/dL — AB (ref 13.0–17.0)
MCH: 28.6 pg (ref 26.0–34.0)
MCHC: 33.3 g/dL (ref 30.0–36.0)
MCV: 85.8 fL (ref 78.0–100.0)
Platelets: 206 10*3/uL (ref 150–400)
RBC: 3.81 MIL/uL — AB (ref 4.22–5.81)
RDW: 15.8 % — AB (ref 11.5–15.5)
WBC: 4.5 10*3/uL (ref 4.0–10.5)

## 2017-05-14 LAB — URINE CULTURE: Culture: NO GROWTH

## 2017-05-14 LAB — COMPREHENSIVE METABOLIC PANEL
ALBUMIN: 2.8 g/dL — AB (ref 3.5–5.0)
ALT: 22 U/L (ref 17–63)
AST: 34 U/L (ref 15–41)
Alkaline Phosphatase: 80 U/L (ref 38–126)
Anion gap: 10 (ref 5–15)
BILIRUBIN TOTAL: 1 mg/dL (ref 0.3–1.2)
BUN: 48 mg/dL — AB (ref 6–20)
CHLORIDE: 111 mmol/L (ref 101–111)
CO2: 19 mmol/L — ABNORMAL LOW (ref 22–32)
Calcium: 8.5 mg/dL — ABNORMAL LOW (ref 8.9–10.3)
Creatinine, Ser: 2.23 mg/dL — ABNORMAL HIGH (ref 0.61–1.24)
GFR calc Af Amer: 35 mL/min — ABNORMAL LOW (ref >60)
GFR calc non Af Amer: 30 mL/min — ABNORMAL LOW (ref >60)
GLUCOSE: 75 mg/dL (ref 65–99)
POTASSIUM: 4.1 mmol/L (ref 3.5–5.1)
Sodium: 140 mmol/L (ref 135–145)
TOTAL PROTEIN: 7 g/dL (ref 6.5–8.1)

## 2017-05-14 LAB — AMMONIA: AMMONIA: 18 umol/L (ref 9–35)

## 2017-05-14 LAB — T4, FREE: Free T4: 1.07 ng/dL (ref 0.61–1.12)

## 2017-05-14 LAB — TSH: TSH: 5.706 u[IU]/mL — ABNORMAL HIGH (ref 0.350–4.500)

## 2017-05-14 LAB — STREP PNEUMONIAE URINARY ANTIGEN: Strep Pneumo Urinary Antigen: NEGATIVE

## 2017-05-14 LAB — PROTIME-INR
INR: 1.53
PROTHROMBIN TIME: 18.3 s — AB (ref 11.4–15.2)

## 2017-05-14 LAB — PROCALCITONIN: Procalcitonin: <0.1 ng/mL

## 2017-05-14 LAB — HEMOGLOBIN AND HEMATOCRIT, BLOOD
HEMATOCRIT: 30.9 % — AB (ref 39.0–52.0)
Hemoglobin: 10.4 g/dL — ABNORMAL LOW (ref 13.0–17.0)

## 2017-05-14 LAB — APTT: aPTT: 32 seconds (ref 24–36)

## 2017-05-14 LAB — MRSA PCR SCREENING: MRSA by PCR: POSITIVE — AB

## 2017-05-14 LAB — VITAMIN B12: VITAMIN B 12: 258 pg/mL (ref 180–914)

## 2017-05-14 LAB — DIGOXIN LEVEL: Digoxin Level: 1.4 ng/mL (ref 0.8–2.0)

## 2017-05-14 MED ORDER — VANCOMYCIN HCL IN DEXTROSE 1-5 GM/200ML-% IV SOLN
1000.0000 mg | Freq: Once | INTRAVENOUS | Status: AC
  Administered 2017-05-14: 1000 mg via INTRAVENOUS
  Filled 2017-05-14: qty 200

## 2017-05-14 MED ORDER — DEXTROSE 5 % IV SOLN
INTRAVENOUS | Status: DC
Start: 2017-05-14 — End: 2017-05-15
  Administered 2017-05-14: 10:00:00 via INTRAVENOUS

## 2017-05-14 MED ORDER — CHLORHEXIDINE GLUCONATE CLOTH 2 % EX PADS
6.0000 | MEDICATED_PAD | Freq: Every day | CUTANEOUS | Status: DC
  Administered 2017-05-15 – 2017-05-17 (×3): 6 via TOPICAL

## 2017-05-14 MED ORDER — VANCOMYCIN HCL IN DEXTROSE 1-5 GM/200ML-% IV SOLN
1000.0000 mg | Freq: Once | INTRAVENOUS | Status: DC
  Filled 2017-05-14: qty 200

## 2017-05-14 MED ORDER — DEXTROSE 5 % IV SOLN
2.0000 g | Freq: Once | INTRAVENOUS | Status: AC
  Administered 2017-05-14: 2 g via INTRAVENOUS
  Filled 2017-05-14: qty 2

## 2017-05-14 MED ORDER — VANCOMYCIN HCL 10 G IV SOLR
2000.0000 mg | INTRAVENOUS | Status: DC
  Filled 2017-05-14: qty 2000

## 2017-05-14 MED ORDER — MUPIROCIN 2 % EX OINT
1.0000 "application " | TOPICAL_OINTMENT | Freq: Two times a day (BID) | CUTANEOUS | Status: AC
  Administered 2017-05-15 – 2017-05-19 (×9): 1 via NASAL
  Filled 2017-05-14 (×2): qty 22

## 2017-05-14 MED ORDER — DEXTROSE 50 % IV SOLN
INTRAVENOUS | Status: AC
  Administered 2017-05-14: 50 mL
  Filled 2017-05-14: qty 50

## 2017-05-14 MED ORDER — VANCOMYCIN HCL 10 G IV SOLR
1250.0000 mg | INTRAVENOUS | Status: DC
  Filled 2017-05-14: qty 1250

## 2017-05-14 MED ORDER — DEXTROSE 5 % IV SOLN
2.0000 g | INTRAVENOUS | Status: DC
  Administered 2017-05-15 – 2017-05-17 (×3): 2 g via INTRAVENOUS
  Filled 2017-05-14 (×3): qty 2

## 2017-05-14 NOTE — Progress Notes (Signed)
PROGRESS NOTE    [name]  QVZ:[id number] DOB: [birth year] DOA: 05/01/2017 PCP: [facility]   Brief Narrative: [name]  is a 62 y.o. male, w CHF(EF 10-15%), CAD, Dm2, Hypertension, hx of prostate abscess s/p drainage, 02/18/2017,  ? Dementia, apparently has had altered mental status per sister over the past 1 day.   Pt has also had slight increase in lower ext edema over the past 1 month and rectal bleeding starting today.  Pt does have history of hemorrhoids,  His sister can't recall if he had has a colonoscopy in the past   Pt denies fever, chills, cp, palp, sob, n/v, abd pain, diarrhea, black stool.  Pt was brought in for rectal bleeding on toilet paper and in toilet per his sister.    In ED,  Wbc 5.1, Hgb 11.5, Plt 208 Na 141, K 4.5, Hco3 17, Bun 46, Creatinine 2.28 Albumin 3.0 Ast 40, Alt 21  Urinalysis negative  Ammonia 51  ABG ph 7.385, Co2 32.4, O2 29 (venous)    Assessment & Plan:   Principal Problem:   ARF (acute renal failure) (HCC) Active Problems:   Cardiomyopathy, nonischemic (HCC)   Rectal bleeding   Edema   Altered mental status   1-Acute encephalopathy; multifactorial, hypoglycemia, hypothermia, infection.  Ammonia initially elevated at 50---decreased to 18. On lactulose.  Treating underline pathology.  Elevated TSH;; check free T 3 and Free T 4.  CT head no acute abnormalities,   2-Hypoglycemia; start D 5 lo rate. Monitor for HF.   3-Hypothermia; rule out sepsis. He report cough, chest x ray with atelectasis vs consolidation.  Check lactic acid.  Start IV antibiotics to cover for infection. PNA. Check legionella antigen, strept pneumonia.  Follow blood culture.  UA negative.  Transfer to step down unit.  Bear hugger  4-Rectal Bleeding. No further episode per nurse report. Monitor closely. Repeat HB tonight, transfusion as indicated. Continue with Protonix.  Hold xarelto.  Might need GI evaluation when stable.   5-AKI;  renal US negative for obstruction. Last cr per records at 1.5---1.2 Low rate IV fluids. Carefull with fluids and HF. Monitor closely.   6-Systolic Heart failure;  Does not appears overload. Hold on lasix due to AKI and concern for sepsis.  Monitor for pulmonary edema.    DVT prophylaxis: SCD. Code Status: Full code.  Family Communication: D/W patient.  Disposition Plan: remain inpatient, step down unit.   Consultants:   none  Procedures:  Renal US; Increased echogenicity of bilateral kidneys consistent with medical renal disease.  Mild left renal pelviectasis.  Moderate abdominopelvic ascites.     Antimicrobials:   Vancomycin  Cefepime.    Subjective: He is sleepy, wake up. Report some dry cough. Denies abdominal pain, chest pain.    Objective: Vitals:   04/26/2017 2232 05/14/17 0534 05/14/17 0634 05/14/17 0710  BP: (!) 143/84 136/85    Pulse: 83 86    Resp: 18 17    Temp: (!) 97.3 F (36.3 C)  (!) 94.7 F (34.8 C) (!) 94.7 F (34.8 C)  TempSrc: Oral  Rectal Rectal  SpO2: 100% 100%    Weight: 92.4 kg (203 lb 11.3 oz)     Height: 6\' 2"  (1.88 m)       Intake/Output Summary (Last 24 hours) at 05/14/2017 0840 Last data filed at 05/17/2017 1727 Gross per 24 hour  Intake -  Output 350 ml  Net -350 ml   Filed Weights   04/25/2017 2232  Weight: 92.4 kg (203 lb 11.3 oz)    Examination:  General exam: sleepy  Respiratory system: ronchus on the right. Respiratory effort normal. Cardiovascular system: S1 & S2 heard, RRR. No JVD, murmurs, rubs, gallops or clicks. Trace edema Gastrointestinal system: Abdomen is nondistended, soft and nontender. No organomegaly or masses felt. Normal bowel sounds heard. Central nervous system: Alert and oriented. No focal neurological deficits. Extremities: Symmetric 5 x 5 power. Skin: No rashes, lesions or ulcers    Data Reviewed: I have personally reviewed following labs and imaging studies  CBC: Recent Labs  Lab  05/21/2017 1550 05/14/17 0001  WBC 5.1 4.5  HGB 11.5* 10.9*  HCT 34.0* 32.7*  MCV 86.3 85.8  PLT 208 206   Basic Metabolic Panel: Recent Labs  Lab 05/07/2017 1550 05/14/17 0001  NA 141 140  K 4.5 4.1  CL 110 111  CO2 17* 19*  GLUCOSE 83 75  BUN 46* 48*  CREATININE 2.28* 2.23*  CALCIUM 8.5* 8.5*   GFR: Estimated Creatinine Clearance: 39.9 mL/min (A) (by C-G formula based on SCr of 2.23 mg/dL (H)). Liver Function Tests: Recent Labs  Lab 05/14/2017 1550 05/14/17 0001  AST 40 34  ALT 21 22  ALKPHOS 87 80  BILITOT 0.8 1.0  PROT 7.3 7.0  ALBUMIN 3.0* 2.8*   No results for input(s): LIPASE, AMYLASE in the last 168 hours. Recent Labs  Lab 05/15/2017 1616 05/14/17 0001  AMMONIA 51* 18   Coagulation Profile: No results for input(s): INR, PROTIME in the last 168 hours. Cardiac Enzymes: No results for input(s): CKTOTAL, CKMB, CKMBINDEX, TROPONINI in the last 168 hours. BNP (last 3 results) No results for input(s): PROBNP in the last 8760 hours. HbA1C: No results for input(s): HGBA1C in the last 72 hours. CBG: Recent Labs  Lab 05/14/17 0835  GLUCAP 67   Lipid Profile: No results for input(s): CHOL, HDL, LDLCALC, TRIG, CHOLHDL, LDLDIRECT in the last 72 hours. Thyroid Function Tests: Recent Labs    05/14/17 0001  TSH 5.706*   Anemia Panel: Recent Labs    05/14/17 0001  VITAMINB12 258   Sepsis Labs: No results for input(s): PROCALCITON, LATICACIDVEN in the last 168 hours.  Recent Results (from the past 240 hour(s))  MRSA PCR Screening     Status: Abnormal   Collection Time: 04/25/2017 10:39 PM  Result Value Ref Range Status   MRSA by PCR POSITIVE (A) NEGATIVE Final    Comment: RN,ALECIA POTEAT [id number] @0210  THANEY        The GeneXpert MRSA Assay (FDA approved for NASAL specimens only), is one component of a comprehensive MRSA colonization surveillance program. It is not intended to diagnose MRSA infection nor to guide or monitor treatment for MRSA  infections.          Radiology Studies: Dg Chest 2 View  Result Date: 05/03/2017 CLINICAL DATA:  62 year old male with history of altered mental status. Rectal bleeding. Lethargy. EXAM: CHEST  2 VIEW COMPARISON:  Chest x-ray 04/10/2017. FINDINGS: Lung volumes are low. Bibasilar opacities (right greater than left), similar to the prior study, which may reflect areas of atelectasis and/or airspace consolidation, with superimposed small to moderate bilateral pleural effusions. No evidence of pulmonary edema. Heart size appears borderline enlarged. Upper mediastinal contours are within normal limits. IMPRESSION: 1. Low lung volumes with small to moderate bilateral pleural effusions and associated areas of atelectasis and/or consolidation in the lung bases bilaterally, very similar to prior study from 04/10/2017. Electronically Signed   By: Reuel Boom  Entrikin M.D.   On: 06-09-17 17:23   Ct Head Wo Contrast  Result Date: 2017-06-09 CLINICAL DATA:  Headache EXAM: CT HEAD WITHOUT CONTRAST TECHNIQUE: Contiguous axial images were obtained from the base of the skull through the vertex without intravenous contrast. COMPARISON:  10/23/2008 FINDINGS: Brain: No evidence of acute infarction, hemorrhage, hydrocephalus, extra-axial collection or mass lesion/mass effect. Moderate generalized atrophy. Moderate low-density in the cerebral white matter, occipital parietal predominant and compatible with chronic small vessel ischemia given patient's risk factors. Left superior temporal gliosis that is likely post ischemic. Vascular: Atherosclerotic calcification.  No hyperdense vessel. Skull: No acute or aggressive finding. Sinuses/Orbits: Negative IMPRESSION: 1. No acute finding or explanation for headache. 2. Atrophy and ischemic injury that has progressed from 2010. Electronically Signed   By: Marnee Spring M.D.   On: 06/09/17 17:56   US Renal  Result Date: June 09, 2017 CLINICAL DATA:  Acute renal failure. EXAM:  RENAL / URINARY TRACT ULTRASOUND COMPLETE COMPARISON:  08/08/2016 FINDINGS: Right Kidney: Length: 12.2 cm. Increased echogenicity. No mass or hydronephrosis visualized. Left Kidney: Length: 11.0 cm. Increased echogenicity. No mass. Mild left pelviectasis. Bladder: Appears normal for degree of bladder distention. Bilateral ureteral jets are not seen. Moderate amount of abdominopelvic ascites. Bilateral pleural effusions. IMPRESSION: Increased echogenicity of bilateral kidneys consistent with medical renal disease. Mild left renal pelviectasis. Moderate abdominopelvic ascites. Bilateral pleural effusions. Electronically Signed   By: Ted Mcalpine M.D.   On: 06/09/17 21:41        Scheduled Meds: . atorvastatin  20 mg Oral q1800  . carvedilol  3.125 mg Oral BID  . digoxin  0.125 mg Oral Daily  . feeding supplement (ENSURE ENLIVE)  237 mL Oral TID BM  . feeding supplement (PRO-STAT SUGAR FREE 64)  30 mL Oral BID  . isosorbide mononitrate  30 mg Oral Daily  . lactulose  30 g Oral Once  . LORazepam  0.5 mg Intravenous Once  . pantoprazole (PROTONIX) IV  40 mg Intravenous Q12H  . sertraline  100 mg Oral QHS  . sodium bicarbonate  650 mg Oral BID  . sodium chloride flush  3 mL Intravenous Q12H   Continuous Infusions: . sodium chloride    . ceFEPime (MAXIPIME) IV    . dextrose    . vancomycin       LOS: 1 day    Time spent: 35 minutes.     Alba Cory, MD [facility] Pager [phone]  If 7PM-7AM, please contact night-coverage [email or web address] Password [email or web address] 05/14/2017, 8:40 AM

## 2017-05-14 NOTE — Progress Notes (Signed)
Paged MD regarding lactulose because pts ammonia is 18. MD said to not give.

## 2017-05-14 NOTE — Progress Notes (Signed)
Page MD regarding pts rectal temp of 94.7. Wrapped pt in warm blankets. MD said to recheck temp and if it is not within range pt will need bear hugger.

## 2017-05-14 NOTE — Progress Notes (Signed)
Hypoglycemic Event  CBG: 67  Treatment: D50 IV 50 mL  Symptoms: None  Follow-up CBG: Time: 1221 CBG Result: 168  Possible Reasons for Event: Unknown  Comments/MD notified: Dr Sunnie Nielsen in room at time of event. Verbal order to give 50 ml D50    [name]

## 2017-05-14 NOTE — Progress Notes (Signed)
Pharmacy Antibiotic Note  [name] is a 62 y.o. male admitted on 16-May-2017 with sepsis.  Pharmacy has been consulted for vancomycin and cefepime dosing.  Plan: Vancomycin 2 gram load IV x 1, then 1250 mg IV q 24 hours. Goal trough 15 - 20. Cefepime 2 gram IV q 24 hrs Monitor renal function, clinical status F/u cultures, LOT, de-escalation plans  Height: 6\' 2"  (188 cm) Weight: 203 lb 11.3 oz (92.4 kg) IBW/kg (Calculated) : 82.2  Temp (24hrs), Avg:96.1 F (35.6 C), Min:94.7 F (34.8 C), Max:97.6 F (36.4 C)  Recent Labs  Lab 16-May-2017 1550 05/14/17 0001  WBC 5.1 4.5  CREATININE 2.28* 2.23*    Estimated Creatinine Clearance: 39.9 mL/min (A) (by C-G formula based on SCr of 2.23 mg/dL (H)).    No Known Allergies  Antimicrobials this admission: Vanc 1/20 >> Cefepime 1/20>>  Microbiology results: 1/19 BCx: pending 1/19 UCx: pending  1/19 MRSA PCR: positive  Thank you for allowing pharmacy to be a part of this patient's care.  Al Corpus, PharmD PGY1 Pharmacy Resident Phone: [phone] After 3:30PM please call Main Pharmacy [phone] 05/14/2017 9:29 AM

## 2017-05-15 DIAGNOSIS — K625 Hemorrhage of anus and rectum: Secondary | ICD-10-CM

## 2017-05-15 DIAGNOSIS — I428 Other cardiomyopathies: Secondary | ICD-10-CM

## 2017-05-15 DIAGNOSIS — N170 Acute kidney failure with tubular necrosis: Secondary | ICD-10-CM

## 2017-05-15 DIAGNOSIS — Z515 Encounter for palliative care: Secondary | ICD-10-CM

## 2017-05-15 DIAGNOSIS — Z66 Do not resuscitate: Secondary | ICD-10-CM

## 2017-05-15 LAB — CBC
HCT: 33.3 % — ABNORMAL LOW (ref 39.0–52.0)
Hemoglobin: 11.2 g/dL — ABNORMAL LOW (ref 13.0–17.0)
MCH: 28.9 pg (ref 26.0–34.0)
MCHC: 33.6 g/dL (ref 30.0–36.0)
MCV: 85.8 fL (ref 78.0–100.0)
PLATELETS: 197 10*3/uL (ref 150–400)
RBC: 3.88 MIL/uL — AB (ref 4.22–5.81)
RDW: 15.5 % (ref 11.5–15.5)
WBC: 5.1 10*3/uL (ref 4.0–10.5)

## 2017-05-15 LAB — T3, FREE: T3 FREE: 1.3 pg/mL — AB (ref 2.0–4.4)

## 2017-05-15 LAB — FOLATE RBC
FOLATE, HEMOLYSATE: 275 ng/mL
FOLATE, RBC: 841 ng/mL (ref >498)
HEMATOCRIT: 32.7 % — AB (ref 37.5–51.0)

## 2017-05-15 LAB — BASIC METABOLIC PANEL
ANION GAP: 12 (ref 5–15)
BUN: 49 mg/dL — ABNORMAL HIGH (ref 6–20)
CALCIUM: 8.4 mg/dL — AB (ref 8.9–10.3)
CO2: 18 mmol/L — ABNORMAL LOW (ref 22–32)
Chloride: 109 mmol/L (ref 101–111)
Creatinine, Ser: 2.13 mg/dL — ABNORMAL HIGH (ref 0.61–1.24)
GFR, EST AFRICAN AMERICAN: 37 mL/min — AB (ref >60)
GFR, EST NON AFRICAN AMERICAN: 32 mL/min — AB (ref >60)
GLUCOSE: 187 mg/dL — AB (ref 65–99)
Potassium: 4.1 mmol/L (ref 3.5–5.1)
SODIUM: 139 mmol/L (ref 135–145)

## 2017-05-15 LAB — GLUCOSE, CAPILLARY
GLUCOSE-CAPILLARY: 166 mg/dL — AB (ref 65–99)
Glucose-Capillary: 202 mg/dL — ABNORMAL HIGH (ref 65–99)

## 2017-05-15 LAB — SODIUM, URINE, RANDOM: Sodium, Ur: <10 mmol/L

## 2017-05-15 MED ORDER — FUROSEMIDE 10 MG/ML IJ SOLN
40.0000 mg | Freq: Two times a day (BID) | INTRAMUSCULAR | Status: DC
  Administered 2017-05-15 – 2017-05-19 (×9): 40 mg via INTRAVENOUS
  Filled 2017-05-15 (×9): qty 4

## 2017-05-15 MED ORDER — DIGOXIN 125 MCG PO TABS
0.0625 mg | ORAL_TABLET | Freq: Every day | ORAL | Status: DC
  Administered 2017-05-15 – 2017-05-19 (×4): 0.0625 mg via ORAL
  Filled 2017-05-15 (×4): qty 1

## 2017-05-15 MED ORDER — HALOPERIDOL LACTATE 5 MG/ML IJ SOLN
5.0000 mg | Freq: Once | INTRAMUSCULAR | Status: AC
  Administered 2017-05-15: 5 mg via INTRAVENOUS
  Filled 2017-05-15: qty 1

## 2017-05-15 MED ORDER — LEVOTHYROXINE SODIUM 25 MCG PO TABS
25.0000 ug | ORAL_TABLET | Freq: Every day | ORAL | Status: DC
  Administered 2017-05-15 – 2017-05-17 (×3): 25 ug via ORAL
  Filled 2017-05-15 (×3): qty 1

## 2017-05-15 NOTE — Consult Note (Signed)
   [facility] Inpatient Consult   05/15/2017  LILBERN SLACUM 1955/08/20 [id number]  Patient screened for potential [facility] Care Management services for multiple admissions in the [facility]. Marland Kitchen Patient is in the [facility] of the [facility] Care Management services under patient's [employer] plan. Chart review reveals the patient is a resident at [facility].  Palliative Care consult note reveals the patient is to return to [facility] with Hospice support. No [facility] Care management needs are noted.   For questions contact:   Charlesetta Shanks, RN BSN CCM [facility]  [phone] business mobile phone Toll free office [phone]

## 2017-05-15 NOTE — Consult Note (Signed)
[facility] Renal Consultation Note  Requesting MD: Regalado Indication for Consultation: AKI  HPI:  [name] is a 62 y.o. male with past medical history significant for diabetes mellitus,  advanced heart failure, ejection fraction of 10-15%, polysubstance abuse, possible dementia-was also mild CKD in the setting of a solitary kidney.  He told the palliative care that he was told last year that he had 2 months to live.  He is happy to have surpassed that expectation but does request a DNR status at this time.  He was brought to the hospital on 1/19 from [facility] for rectal bleeding and encephalopathy.  She was found to have an elevated ammonia, treated with lactulose and has improved.  Briefly covered with antibiotics but does have now been stopped.  Presenting creatinine was 2.28 where it had been 1.5 approximately a month ago.  He received some IV fluids although it is unclear how much.  Urine output has not been well recorded.  Creatinine today 2.13- is not hypoxic, blood pressure 120-140.  He is complaining of not being able to clear his throat, he also notes lower extremity edema  Creatinine  Date/Time Value Ref Range Status  08/18/2016 0.9 0.6 - 1.3 mg/dL Final   Creatinine, Ser  Date/Time Value Ref Range Status  05/15/2017 08:21 AM 2.13 (H) 0.61 - 1.24 mg/dL Final  05/14/2017 12:01 AM 2.23 (H) 0.61 - 1.24 mg/dL Final  05/08/2017 03:50 PM 2.28 (H) 0.61 - 1.24 mg/dL Final  04/10/2017 04:46 AM 1.52 (H) 0.61 - 1.24 mg/dL Final  02/26/2017 04:02 AM 1.22 0.61 - 1.24 mg/dL Final  02/25/2017 06:52 AM 1.29 (H) 0.61 - 1.24 mg/dL Final  02/23/2017 06:07 AM 1.00 0.61 - 1.24 mg/dL Final  02/22/2017 07:19 AM 1.12 0.61 - 1.24 mg/dL Final  02/21/2017 07:39 AM 1.22 0.61 - 1.24 mg/dL Final  02/20/2017 05:51 AM 1.44 (H) 0.61 - 1.24 mg/dL Final  02/19/2017 03:53 AM 1.39 (H) 0.61 - 1.24 mg/dL Final  02/17/2017 09:48 PM 1.55 (H) 0.61 - 1.24 mg/dL Final  12/07/2016 04:49 AM 0.89  0.61 - 1.24 mg/dL Final  12/06/2016 03:36 AM 1.15 0.61 - 1.24 mg/dL Final  12/05/2016 02:26 AM 1.07 0.61 - 1.24 mg/dL Final  12/04/2016 06:09 AM 0.88 0.61 - 1.24 mg/dL Final  10/19/2016 02:08 PM 0.92 0.61 - 1.24 mg/dL Final  09/21/2016 03:22 PM 0.78 0.61 - 1.24 mg/dL Final  08/13/2016 04:27 AM 0.90 0.61 - 1.24 mg/dL Final  08/12/2016 04:43 AM 0.91 0.61 - 1.24 mg/dL Final  08/11/2016 05:32 AM 0.94 0.61 - 1.24 mg/dL Final  08/10/2016 04:45 AM 1.20 0.61 - 1.24 mg/dL Final  08/09/2016 05:00 AM 1.55 (H) 0.61 - 1.24 mg/dL Final  08/08/2016 03:32 AM 2.33 (H) 0.61 - 1.24 mg/dL Final  08/07/2016 06:33 PM 2.68 (H) 0.61 - 1.24 mg/dL Final  08/07/2016 09:18 AM 2.72 (H) 0.61 - 1.24 mg/dL Final  08/07/2016 05:55 AM 2.57 (H) 0.61 - 1.24 mg/dL Final  08/06/2016 02:39 PM 1.99 (H) 0.61 - 1.24 mg/dL Final  08/06/2016 01:03 AM 1.24 0.61 - 1.24 mg/dL Final  08/05/2016 07:21 PM 1.11 0.61 - 1.24 mg/dL Final  04/14/2016 04:05 AM 1.01 0.61 - 1.24 mg/dL Final  04/13/2016 04:59 AM 1.07 0.61 - 1.24 mg/dL Final  04/12/2016 03:37 AM 0.98 0.61 - 1.24 mg/dL Final  04/11/2016 12:07 PM 1.04 0.61 - 1.24 mg/dL Final  04/10/2016 10:03 PM 0.89 0.61 - 1.24 mg/dL Final  12/25/2015 11:06 AM 1.07 0.61 - 1.24 mg/dL Final  09/16/2015  05:35 AM 1.22 0.61 - 1.24 mg/dL Final  09/14/2015 05:18 AM 1.12 0.61 - 1.24 mg/dL Final  09/13/2015 05:38 AM 1.20 0.61 - 1.24 mg/dL Final  09/12/2015 05:16 AM 1.15 0.61 - 1.24 mg/dL Final  09/11/2015 05:07 AM 1.17 0.61 - 1.24 mg/dL Final  09/10/2015 05:16 AM 1.09 0.61 - 1.24 mg/dL Final  09/09/2015 03:17 AM 1.49 (H) 0.61 - 1.24 mg/dL Final  09/08/2015 03:04 AM 1.01 0.61 - 1.24 mg/dL Final  09/07/2015 08:05 AM 0.91 0.61 - 1.24 mg/dL Final  03/12/2015 04:47 AM 1.08 0.61 - 1.24 mg/dL Final  03/11/2015 03:18 AM 1.13 0.61 - 1.24 mg/dL Final  03/10/2015 08:46 AM 1.10 0.61 - 1.24 mg/dL Final  03/10/2015 01:50 AM 1.00 0.61 - 1.24 mg/dL Final  02/27/2014 05:20 AM 0.87 0.50 - 1.35 mg/dL Final   02/25/2014 04:55 AM 0.76 0.50 - 1.35 mg/dL Final  02/24/2014 05:00 AM 0.72 0.50 - 1.35 mg/dL Final     PMHx:   Past Medical History:  Diagnosis Date  . CHF (congestive heart failure) (HCC)    EF 10-15%  . Coronary artery disease   . Depression   . Diabetes mellitus   . Hypertension   . Mental disorder   . MI (myocardial infarction) ([place]) 36   age 19 per patient    Past Surgical History:  Procedure Laterality Date  . ANGIOPLASTY  1984   at age 86  . IR CATHETER TUBE CHANGE  02/27/2017  . LEFT AND RIGHT HEART CATHETERIZATION WITH CORONARY/GRAFT ANGIOGRAM  08/13/2012   Procedure: LEFT AND RIGHT HEART CATHETERIZATION WITH [name];  Surgeon: Burnell Blanks, MD;  Location: [facility] CATH LAB;  Service: Cardiovascular;;    Family Hx:  Family History  Problem Relation Age of Onset  . Heart attack Father        >60s  . Hypertension Father   . Diabetes Father   . Heart attack Sister   . Hypertension Mother     Social History:  reports that he has been smoking cigarettes.  He has a 20.00 pack-year smoking history. he has never used smokeless tobacco. He reports that he drinks alcohol. He reports that he uses drugs. Drugs: Marijuana and "Crack" cocaine. Frequency: 7.00 times per week.  Allergies: No Known Allergies  Medications: Prior to Admission medications   Medication Sig Start Date End Date Taking? Authorizing Provider  acetaminophen (TYLENOL) 325 MG tablet Take 650 mg by mouth every 6 (six) hours as needed for mild pain.   Yes [provider]  albuterol (PROVENTIL HFA;VENTOLIN HFA) 108 (90 Base) MCG/ACT inhaler Inhale 2 puffs into the lungs every 6 (six) hours as needed for wheezing or shortness of breath.   Yes [provider]  Amino Acids-Protein Hydrolys (FEEDING SUPPLEMENT, PRO-STAT SUGAR FREE 64,) LIQD Take 30 mLs 2 (two) times daily by mouth. 02/27/17  Yes Debbe Odea, MD  ARIPiprazole (ABILIFY) 5 MG tablet Take 5 mg by mouth  daily.   Yes [provider]  atorvastatin (LIPITOR) 20 MG tablet Take 1 tablet (20 mg total) by mouth daily at 6 PM. 09/21/16  Yes Tillery, Satira Mccallum, PA-C  buPROPion (WELLBUTRIN XL) 150 MG 24 hr tablet Take 1 tablet (150 mg total) by mouth daily. 12/06/16  Yes Nedrud, Larena Glassman, MD  carvedilol (COREG) 3.125 MG tablet Take 1 tablet (3.125 mg total) by mouth 2 (two) times daily. 12/06/16 12/06/17 Yes Maryellen Pile, MD  digoxin (LANOXIN) 0.125 MG tablet Take 1 tablet (0.125 mg total) by mouth daily. 09/21/16  Yes Shirley Friar, PA-C  feeding supplement, ENSURE ENLIVE, (ENSURE ENLIVE) LIQD Take 237 mLs 3 (three) times daily between meals by mouth. 02/27/17  Yes Debbe Odea, MD  furosemide (LASIX) 40 MG tablet Take 1 tablet (40 mg total) daily as needed by mouth. For weight gain of > 3 lb in 24-48 hr period Patient taking differently: Take 40 mg by mouth See admin instructions. Take one tablet (40 mg) by mouth twice daily - 9am and 6pm 02/27/17  Yes Rizwan, Eunice Blase, MD  hydrALAZINE (APRESOLINE) 25 MG tablet Take 1 tablet (25 mg total) by mouth 3 (three) times daily. 09/21/16  Yes Shirley Friar, PA-C  insulin glargine (LANTUS) 100 UNIT/ML injection Inject 0.1 mLs (10 Units total) daily into the skin. Patient taking differently: Inject 10 Units into the skin at bedtime.  02/28/17  Yes Debbe Odea, MD  isosorbide mononitrate (IMDUR) 30 MG 24 hr tablet Take 1 tablet (30 mg total) by mouth daily. 09/21/16  Yes Shirley Friar, PA-C  LORazepam (ATIVAN) 0.5 MG tablet Take 0.5 mg by mouth at bedtime.   Yes [provider]  metFORMIN (GLUCOPHAGE) 1000 MG tablet Take 1 tablet (1,000 mg total) by mouth 2 (two) times daily with a meal. For high blood sugar control 08/13/16  Yes Rosita Fire, MD  oxyCODONE (OXY IR/ROXICODONE) 5 MG immediate release tablet Take 1 tablet (5 mg total) every 6 (six) hours as needed by mouth for moderate pain. 02/28/17  Yes Debbe Odea,  MD  rivaroxaban (XARELTO) 20 MG TABS tablet Take 1 tablet (20 mg total) by mouth daily with supper. 12/26/16  Yes Nedrud, Larena Glassman, MD  sertraline (ZOLOFT) 100 MG tablet Take 100 mg by mouth at bedtime.    Yes [provider]  sodium bicarbonate 650 MG tablet Take 1 tablet (650 mg total) 2 (two) times daily by mouth. 02/27/17  Yes Debbe Odea, MD  traZODone (DESYREL) 100 MG tablet Take 1 tablet (100 mg total) by mouth at bedtime. For sleep 12/27/12  Yes Lindell Spar I, NP    I have reviewed the patient's current medications.  Labs:  Results for orders placed or performed during the hospital encounter of 05/05/2017 (from the past 48 hour(s))  Comprehensive metabolic panel     Status: Abnormal   Collection Time: 04/25/2017  3:50 PM  Result Value Ref Range   Sodium 141 135 - 145 mmol/L   Potassium 4.5 3.5 - 5.1 mmol/L   Chloride 110 101 - 111 mmol/L   CO2 17 (L) 22 - 32 mmol/L   Glucose, Bld 83 65 - 99 mg/dL   BUN 46 (H) 6 - 20 mg/dL   Creatinine, Ser 2.28 (H) 0.61 - 1.24 mg/dL   Calcium 8.5 (L) 8.9 - 10.3 mg/dL   Total Protein 7.3 6.5 - 8.1 g/dL   Albumin 3.0 (L) 3.5 - 5.0 g/dL   AST 40 15 - 41 U/L   ALT 21 17 - 63 U/L   Alkaline Phosphatase 87 38 - 126 U/L   Total Bilirubin 0.8 0.3 - 1.2 mg/dL   GFR calc non Af Amer 29 (L) >60 mL/min   GFR calc Af Amer 34 (L) >60 mL/min    Comment: (NOTE) The eGFR has been calculated using the CKD EPI equation. This calculation has not been validated in all clinical situations. eGFR's persistently <60 mL/min signify possible Chronic Kidney Disease.    Anion gap 14 5 - 15  CBC     Status: Abnormal  Collection Time: 05/18/2017  3:50 PM  Result Value Ref Range   WBC 5.1 4.0 - 10.5 K/uL   RBC 3.94 (L) 4.22 - 5.81 MIL/uL   Hemoglobin 11.5 (L) 13.0 - 17.0 g/dL   HCT 34.0 (L) 39.0 - 52.0 %   MCV 86.3 78.0 - 100.0 fL   MCH 29.2 26.0 - 34.0 pg   MCHC 33.8 30.0 - 36.0 g/dL   RDW 15.3 11.5 - 15.5 %   Platelets 208 150 - 400 K/uL  Type and screen  [facility]     Status: None   Collection Time: 05/12/2017  3:50 PM  Result Value Ref Range   ABO/RH(D) A POS    Antibody Screen NEG    Sample Expiration 05/16/2017   ABO/Rh     Status: None   Collection Time: 04/26/2017  3:50 PM  Result Value Ref Range   ABO/RH(D) A POS   Urinalysis, Complete w Microscopic     Status: Abnormal   Collection Time: 05/12/2017  4:15 PM  Result Value Ref Range   Color, Urine YELLOW YELLOW   APPearance HAZY (A) CLEAR   Specific Gravity, Urine 1.011 1.005 - 1.030   pH 5.0 5.0 - 8.0   Glucose, UA NEGATIVE NEGATIVE mg/dL   Hgb urine dipstick NEGATIVE NEGATIVE   Bilirubin Urine NEGATIVE NEGATIVE   Ketones, ur NEGATIVE NEGATIVE mg/dL   Protein, ur NEGATIVE NEGATIVE mg/dL   Nitrite NEGATIVE NEGATIVE   Leukocytes, UA NEGATIVE NEGATIVE   RBC / HPF NONE SEEN 0 - 5 RBC/hpf   WBC, UA NONE SEEN 0 - 5 WBC/hpf   Bacteria, UA NONE SEEN NONE SEEN   Squamous Epithelial / LPF NONE SEEN NONE SEEN   Hyaline Casts, UA PRESENT   Ammonia     Status: Abnormal   Collection Time: 05/11/2017  4:16 PM  Result Value Ref Range   Ammonia 51 (H) 9 - 35 umol/L  Urine rapid drug screen (hosp performed)     Status: None   Collection Time: 05/20/2017  4:16 PM  Result Value Ref Range   Opiates NONE DETECTED NONE DETECTED   Cocaine NONE DETECTED NONE DETECTED   Benzodiazepines NONE DETECTED NONE DETECTED   Amphetamines NONE DETECTED NONE DETECTED   Tetrahydrocannabinol NONE DETECTED NONE DETECTED   Barbiturates NONE DETECTED NONE DETECTED    Comment: (NOTE) DRUG SCREEN FOR MEDICAL PURPOSES ONLY.  IF CONFIRMATION IS NEEDED FOR ANY PURPOSE, NOTIFY LAB WITHIN 5 DAYS. LOWEST DETECTABLE LIMITS FOR URINE DRUG SCREEN Drug Class                     Cutoff (ng/mL) Amphetamine and metabolites    1000 Barbiturate and metabolites    200 Benzodiazepine                 846 Tricyclics and metabolites     300 Opiates and metabolites        300 Cocaine and metabolites         300 THC                            50   Ethanol     Status: None   Collection Time: 05/07/2017  4:16 PM  Result Value Ref Range   Alcohol, Ethyl (B) <10 <10 mg/dL    Comment:        LOWEST DETECTABLE LIMIT FOR SERUM ALCOHOL IS 10 mg/dL FOR MEDICAL PURPOSES  ONLY   Blood Cultures (routine x 2)     Status: None (Preliminary result)   Collection Time: 05/18/2017  4:22 PM  Result Value Ref Range   Specimen Description BLOOD BLOOD RIGHT FOREARM    Special Requests AEROBIC BOTTLE ONLY Blood Culture adequate volume    Culture NO GROWTH < 24 HOURS    Report Status PENDING   I-Stat venous blood gas, ED     Status: Abnormal   Collection Time: 04/25/2017  4:46 PM  Result Value Ref Range   pH, Ven 7.385 7.250 - 7.430   pCO2, Ven 32.4 (L) 44.0 - 60.0 mmHg   pO2, Ven 29.0 (LL) 32.0 - 45.0 mmHg   Bicarbonate 19.4 (L) 20.0 - 28.0 mmol/L   TCO2 20 (L) 22 - 32 mmol/L   O2 Saturation 54.0 %   Acid-base deficit 5.0 (H) 0.0 - 2.0 mmol/L   Patient temperature HIDE    Sample type VENOUS   Strep pneumoniae urinary antigen     Status: None   Collection Time: 05/11/2017  5:30 PM  Result Value Ref Range   Strep Pneumo Urinary Antigen NEGATIVE NEGATIVE    Comment:        Infection due to S. pneumoniae cannot be absolutely ruled out since the antigen present may be below the detection limit of the test.   Urine culture     Status: None   Collection Time: 04/27/2017  5:40 PM  Result Value Ref Range   Specimen Description URINE, CATHETERIZED    Special Requests NONE    Culture NO GROWTH    Report Status 05/14/2017 FINAL   Blood Cultures (routine x 2)     Status: None (Preliminary result)   Collection Time: 05/25/2017  5:40 PM  Result Value Ref Range   Specimen Description BLOOD BLOOD RIGHT FOREARM    Special Requests IN PEDIATRIC BOTTLE Blood Culture adequate volume    Culture NO GROWTH < 24 HOURS    Report Status PENDING   POC occult blood, ED     Status: Abnormal   Collection Time: 05/12/2017  5:57 PM   Result Value Ref Range   Fecal Occult Bld POSITIVE (A) NEGATIVE  MRSA PCR Screening     Status: Abnormal   Collection Time: 05/09/2017 10:39 PM  Result Value Ref Range   MRSA by PCR POSITIVE (A) NEGATIVE    Comment: RN,ALECIA POTEAT [id number] @0210  THANEY        The GeneXpert MRSA Assay (FDA approved for NASAL specimens only), is one component of a comprehensive MRSA colonization surveillance program. It is not intended to diagnose MRSA infection nor to guide or monitor treatment for MRSA infections.   Digoxin level     Status: None   Collection Time: 05/14/17 12:01 AM  Result Value Ref Range   Digoxin Level 1.4 0.8 - 2.0 ng/mL  Vitamin B12     Status: None   Collection Time: 05/14/17 12:01 AM  Result Value Ref Range   Vitamin B-12 258 180 - 914 pg/mL    Comment: (NOTE) This assay is not validated for testing neonatal or myeloproliferative syndrome specimens for Vitamin B12 levels.   Sedimentation rate     Status: Abnormal   Collection Time: 05/14/17 12:01 AM  Result Value Ref Range   Sed Rate 40 (H) 0 - 16 mm/hr  Ammonia     Status: None   Collection Time: 05/14/17 12:01 AM  Result Value Ref Range   Ammonia 18 9 - 35 umol/L  Comprehensive metabolic panel     Status: Abnormal   Collection Time: 05/14/17 12:01 AM  Result Value Ref Range   Sodium 140 135 - 145 mmol/L   Potassium 4.1 3.5 - 5.1 mmol/L   Chloride 111 101 - 111 mmol/L   CO2 19 (L) 22 - 32 mmol/L   Glucose, Bld 75 65 - 99 mg/dL   BUN 48 (H) 6 - 20 mg/dL   Creatinine, Ser 2.23 (H) 0.61 - 1.24 mg/dL   Calcium 8.5 (L) 8.9 - 10.3 mg/dL   Total Protein 7.0 6.5 - 8.1 g/dL   Albumin 2.8 (L) 3.5 - 5.0 g/dL   AST 34 15 - 41 U/L   ALT 22 17 - 63 U/L   Alkaline Phosphatase 80 38 - 126 U/L   Total Bilirubin 1.0 0.3 - 1.2 mg/dL   GFR calc non Af Amer 30 (L) >60 mL/min   GFR calc Af Amer 35 (L) >60 mL/min    Comment: (NOTE) The eGFR has been calculated using the CKD EPI equation. This calculation has not been  validated in all clinical situations. eGFR's persistently <60 mL/min signify possible Chronic Kidney Disease.    Anion gap 10 5 - 15  CBC     Status: Abnormal   Collection Time: 05/14/17 12:01 AM  Result Value Ref Range   WBC 4.5 4.0 - 10.5 K/uL   RBC 3.81 (L) 4.22 - 5.81 MIL/uL   Hemoglobin 10.9 (L) 13.0 - 17.0 g/dL   HCT 32.7 (L) 39.0 - 52.0 %   MCV 85.8 78.0 - 100.0 fL   MCH 28.6 26.0 - 34.0 pg   MCHC 33.3 30.0 - 36.0 g/dL   RDW 15.8 (H) 11.5 - 15.5 %   Platelets 206 150 - 400 K/uL  TSH     Status: Abnormal   Collection Time: 05/14/17 12:01 AM  Result Value Ref Range   TSH 5.706 (H) 0.350 - 4.500 uIU/mL    Comment: Performed by a 3rd Generation assay with a functional sensitivity of <=0.01 uIU/mL.  Glucose, capillary     Status: None   Collection Time: 05/14/17  8:35 AM  Result Value Ref Range   Glucose-Capillary 67 65 - 99 mg/dL  Lactic acid, plasma     Status: None   Collection Time: 05/14/17  8:58 AM  Result Value Ref Range   Lactic Acid, Venous 1.1 0.5 - 1.9 mmol/L  Procalcitonin     Status: None   Collection Time: 05/14/17  8:58 AM  Result Value Ref Range   Procalcitonin <0.10 ng/mL    Comment:        Interpretation: PCT (Procalcitonin) <= 0.5 ng/mL: Systemic infection (sepsis) is not likely. Local bacterial infection is possible. (NOTE)       Sepsis PCT Algorithm           Lower Respiratory Tract                                      Infection PCT Algorithm    ----------------------------     ----------------------------         PCT < 0.25 ng/mL                PCT < 0.10 ng/mL         Strongly encourage             Strongly discourage   discontinuation of antibiotics  initiation of antibiotics    ----------------------------     -----------------------------       PCT 0.25 - 0.50 ng/mL            PCT 0.10 - 0.25 ng/mL               OR       >80% decrease in PCT            Discourage initiation of                                            antibiotics       Encourage discontinuation           of antibiotics    ----------------------------     -----------------------------         PCT >= 0.50 ng/mL              PCT 0.26 - 0.50 ng/mL               AND        <80% decrease in PCT             Encourage initiation of                                             antibiotics       Encourage continuation           of antibiotics    ----------------------------     -----------------------------        PCT >= 0.50 ng/mL                  PCT > 0.50 ng/mL               AND         increase in PCT                  Strongly encourage                                      initiation of antibiotics    Strongly encourage escalation           of antibiotics                                     -----------------------------                                           PCT <= 0.25 ng/mL                                                 OR                                        >  80% decrease in PCT                                     Discontinue / Do not initiate                                             antibiotics   Protime-INR     Status: Abnormal   Collection Time: 05/14/17  8:58 AM  Result Value Ref Range   Prothrombin Time 18.3 (H) 11.4 - 15.2 seconds   INR 1.53   APTT     Status: None   Collection Time: 05/14/17  8:58 AM  Result Value Ref Range   aPTT 32 24 - 36 seconds  T3, free     Status: Abnormal   Collection Time: 05/14/17  8:58 AM  Result Value Ref Range   T3, Free 1.3 (L) 2.0 - 4.4 pg/mL    Comment: (NOTE) Performed At: [facility] [address] [place], [place] [id number] Rush Farmer MD OE:[id number]   T4, free     Status: None   Collection Time: 05/14/17  8:58 AM  Result Value Ref Range   Free T4 1.07 0.61 - 1.12 ng/dL    Comment: (NOTE) Biotin ingestion may interfere with free T4 tests. If the results are inconsistent with the TSH level, previous test results, or the clinical presentation, then consider biotin  interference. If needed, order repeat testing after stopping biotin.   Lactic acid, plasma     Status: None   Collection Time: 05/14/17 11:56 AM  Result Value Ref Range   Lactic Acid, Venous 1.2 0.5 - 1.9 mmol/L  Glucose, capillary     Status: Abnormal   Collection Time: 05/14/17 12:21 PM  Result Value Ref Range   Glucose-Capillary 168 (H) 65 - 99 mg/dL  Hemoglobin and hematocrit, blood     Status: Abnormal   Collection Time: 05/14/17  6:07 PM  Result Value Ref Range   Hemoglobin 10.4 (L) 13.0 - 17.0 g/dL   HCT 30.9 (L) 39.0 - 52.0 %  Glucose, capillary     Status: Abnormal   Collection Time: 05/14/17  6:10 PM  Result Value Ref Range   Glucose-Capillary 139 (H) 65 - 99 mg/dL  Glucose, capillary     Status: Abnormal   Collection Time: 05/14/17  9:19 PM  Result Value Ref Range   Glucose-Capillary 166 (H) 65 - 99 mg/dL  CBC     Status: Abnormal   Collection Time: 05/15/17  8:21 AM  Result Value Ref Range   WBC 5.1 4.0 - 10.5 K/uL   RBC 3.88 (L) 4.22 - 5.81 MIL/uL   Hemoglobin 11.2 (L) 13.0 - 17.0 g/dL   HCT 33.3 (L) 39.0 - 52.0 %   MCV 85.8 78.0 - 100.0 fL   MCH 28.9 26.0 - 34.0 pg   MCHC 33.6 30.0 - 36.0 g/dL   RDW 15.5 11.5 - 15.5 %   Platelets 197 150 - 400 K/uL  Basic metabolic panel     Status: Abnormal   Collection Time: 05/15/17  8:21 AM  Result Value Ref Range   Sodium 139 135 - 145 mmol/L   Potassium 4.1 3.5 - 5.1 mmol/L   Chloride 109 101 - 111 mmol/L   CO2 18 (L) 22 - 32  mmol/L   Glucose, Bld 187 (H) 65 - 99 mg/dL   BUN 49 (H) 6 - 20 mg/dL   Creatinine, Ser 2.13 (H) 0.61 - 1.24 mg/dL   Calcium 8.4 (L) 8.9 - 10.3 mg/dL   GFR calc non Af Amer 32 (L) >60 mL/min   GFR calc Af Amer 37 (L) >60 mL/min    Comment: (NOTE) The eGFR has been calculated using the CKD EPI equation. This calculation has not been validated in all clinical situations. eGFR's persistently <60 mL/min signify possible Chronic Kidney Disease.    Anion gap 12 5 - 15  Glucose, capillary      Status: Abnormal   Collection Time: 05/15/17 10:49 AM  Result Value Ref Range   Glucose-Capillary 202 (H) 65 - 99 mg/dL     ROS:  A comprehensive review of systems was negative except for: Ears, nose, mouth, throat, and face: positive for Being unable to clear his throat Cardiovascular: positive for lower extremity edema  Physical Exam: Vitals:   05/15/17 0950 05/15/17 1244  BP: 123/73   Pulse: 85   Resp: (!) 21   Temp: 97.6 F (36.4 C) (!) 97 F (36.1 C)  SpO2: 97%      General: Chronically ill-appearing black male in no acute distress-  HEENT: Pupils are equally round reactive to light, extraocular motions are intact, mucous membranes are moist Neck: Positive for JVD Heart: Regular rate and rhythm  Lungs: Decreased breath sounds at the bases Abdomen: Soft, nontender, nondistended Extremities: 1-2+ pitting edema bilaterally Skin: Warm and dry Neuro: Somnolent but arousable  Assessment/Plan: 61 year old black male with multiple medical issues most notably including an ejection fraction of 10-15% and some CKD at baseline.  He presents now with acute on chronic renal failure and now volume overload 1.Renal-patient with a change in creatinine, worsening renal function most likely associated with cardiorenal syndrome  as urinalysis is completely negative.  Thankfully kidney function stable and not worsening.  Patient would not be considered a dialysis candidate secondary to his significant cardiomyopathy 2. Hypertension/volume  -he is volume overloaded at this time.  I will add on Lasix 40 mg IV every 12 hours he is on very low-dose Coreg but otherwise nothing to lower blood pressure to give him ATN 3.  Metabolic acidosis-on oral bicarb-to continue  4. Anemia  -not below the threshold of treatment at this time   Sherma Vanmetre A 05/15/2017, 2:59 PM

## 2017-05-15 NOTE — [place] FL2 (Signed)
[place] MEDICAID FL2 LEVEL OF CARE SCREENING TOOL     IDENTIFICATION  Patient Name: [name] Birthdate: 1955-07-07 Sex: male Admission Date (Current Location): 05/15/2017  [facility] and [place] Number:  Producer, television/film/video and Address:  [facility], [address], [place], [place] [address]      Provider Number: [id number]  Attending Physician Name and Address:  Alba Cory, MD  Relative Name and Phone Number:  Oswald Hillock [phone].     Current Level of Care: Hospital Recommended Level of Care: Skilled Nursing Facility Prior Approval Number:    Date Approved/Denied:   [name] Number:    Discharge Plan: SNF    Current Diagnoses: Patient Active Problem List   Diagnosis Date Noted  . ARF (acute renal failure) (HCC) 2017/05/15  . Rectal bleeding 05/15/17  . Edema 2017-05-15  . Altered mental status 05-15-2017  . Prostate abscess 02/18/2017  . AKI (acute kidney injury) (HCC) 02/18/2017  . Myocardial ischemia   . Pulmonary embolus (HCC)   . Cocaine abuse (HCC)   . H/O noncompliance with medical treatment, presenting hazards to health 12/04/2016  . Chronic systolic CHF (congestive heart failure) (HCC) 12/04/2016  . Anxiety state   . Depression with anxiety 08/05/2016  . DNR (do not resuscitate) discussion   . Palliative care encounter   . Polysubstance abuse (HCC)   . Type 2 diabetes mellitus with complication (HCC)   . Hypertensive heart disease with chronic systolic congestive heart failure (HCC)   . 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 Hyponatremia 02/23/2014  . Alcohol abuse 12/21/2012  . Cocaine dependence (HCC) 12/21/2012  . Substance induced mood disorder (HCC) 12/21/2012  . Coronary artery disease 08/14/2012  . Cardiomyopathy, nonischemic (HCC) 08/14/2012    Orientation RESPIRATION BLADDER Height & Weight     Self, Place  Normal(room air. ) Incontinent Weight: 203 lb 11.3 oz (92.4 kg) Height:  6\' 2"   (188 cm)  BEHAVIORAL SYMPTOMS/MOOD NEUROLOGICAL BOWEL NUTRITION STATUS      Incontinent Diet(please see discharge summary. )  AMBULATORY STATUS COMMUNICATION OF NEEDS Skin   Extensive Assist Verbally Normal                       Personal Care Assistance Level of Assistance  Bathing, Feeding, Dressing Bathing Assistance: Maximum assistance Feeding assistance: Limited assistance Dressing Assistance: Maximum assistance     Functional Limitations Info  Sight, Hearing, Speech Sight Info: Adequate Hearing Info: Adequate Speech Info: Adequate    SPECIAL CARE FACTORS FREQUENCY  PT (By licensed PT), OT (By licensed OT)     PT Frequency: 5 times a week OT Frequency: 5 times a week            Contractures Contractures Info: Not present    Additional Factors Info  Code Status, Allergies Code Status Info: Full Code  Allergies Info: NKA           Current Medications (05/15/2017):  This is the current hospital active medication list Current Facility-Administered Medications  Medication Dose Route Frequency Provider Last Rate Last Dose  . 0.9 %  sodium chloride infusion  250 mL Intravenous PRN Pearson Grippe, MD      . acetaminophen (TYLENOL) tablet 650 mg  650 mg Oral Q6H PRN Pearson Grippe, MD       Or  . acetaminophen (TYLENOL) suppository 650 mg  650 mg Rectal Q6H PRN Pearson Grippe, MD      . atorvastatin (  LIPITOR) tablet 20 mg  20 mg Oral q1800 Pearson Grippe, MD   20 mg at 05/14/17 1819  . carvedilol (COREG) tablet 3.125 mg  3.125 mg Oral BID Pearson Grippe, MD   3.125 mg at 05/15/17 0925  . ceFEPIme (MAXIPIME) 2 g in dextrose 5 % 50 mL IVPB  2 g Intravenous Q24H Kathyrn Sheriff, [place]   Stopped at 05/15/17 9371  . Chlorhexidine Gluconate Cloth 2 % PADS 6 each  6 each Topical Q0600 Regalado, Belkys A, MD   6 each at 05/15/17 0533  . digoxin (LANOXIN) tablet 0.0625 mg  0.0625 mg Oral Daily Regalado, Belkys A, MD   0.0625 mg at 05/15/17 0935  . feeding supplement (ENSURE ENLIVE) (ENSURE  ENLIVE) liquid 237 mL  237 mL Oral TID BM Pearson Grippe, MD   237 mL at 05/15/17 0928  . feeding supplement (PRO-STAT SUGAR FREE 64) liquid 30 mL  30 mL Oral BID Pearson Grippe, MD   30 mL at 05/15/17 0928  . isosorbide mononitrate (IMDUR) 24 hr tablet 30 mg  30 mg Oral Daily Regalado, Belkys A, MD   30 mg at 05/15/17 0926  . lactulose (CHRONULAC) 10 GM/15ML solution 30 g  30 g Oral Once Garey Ham, MD   Stopped at 05/08/2017 2300  . levothyroxine (SYNTHROID, LEVOTHROID) tablet 25 mcg  25 mcg Oral QAC breakfast Regalado, Belkys A, MD   25 mcg at 05/15/17 0928  . LORazepam (ATIVAN) injection 0.5 mg  0.5 mg Intravenous Once Blane Ohara, MD      . mupirocin ointment (BACTROBAN) 2 % 1 application  1 application Nasal BID Regalado, Belkys A, MD   1 application at 05/15/17 0929  . pantoprazole (PROTONIX) injection 40 mg  40 mg Intravenous Q12H Pearson Grippe, MD   40 mg at 05/15/17 0930  . sertraline (ZOLOFT) tablet 100 mg  100 mg Oral Farrel Demark, MD   100 mg at 05/14/17 2132  . sodium bicarbonate tablet 650 mg  650 mg Oral BID Pearson Grippe, MD   650 mg at 05/15/17 6967  . sodium chloride flush (NS) 0.9 % injection 3 mL  3 mL Intravenous Q12H Pearson Grippe, MD   3 mL at 05/15/17 0928  . sodium chloride flush (NS) 0.9 % injection 3 mL  3 mL Intravenous PRN Pearson Grippe, MD         Discharge Medications: Please see discharge summary for a list of discharge medications.  Relevant Imaging Results:  Relevant Lab Results:   Additional Information SSN- [id number]  Robb Matar, LCSWA

## 2017-05-15 NOTE — Consult Note (Signed)
Consultation Note Date: 05/15/2017   Patient Name: [name]  DOB: 1955-06-18  MRN: [id number]  Age / Sex: 62 y.o., male  [facility] Referring Physician: Elmarie Shiley, MD  Reason for Consultation: Establishing goals of care  HPI/Patient Profile: 62 y.o. male  with past medical history of advanced heart failure with an EF of 10-15%, polysubstance abuse, chronic kidney disease (with 1 kidney) and possible dementia who was admitted on 04/27/2017 with rectal bleeding, altered mental status, hypothermia and hypoglycemia.  PMT was consulted for goals of care.  Clinical Assessment and Goals of Care:  I have reviewed medical records including EPIC notes, labs and imaging, received report from the care team, assessed the patient and then met at the bedside along with his mother [name] and his cousin [name].  We also added his sister [name] on the phone  to discuss diagnosis prognosis, [place], EOL wishes, disposition and options.  I introduced Palliative Medicine as specialized medical care for people living with serious illness. It focuses on providing relief from the symptoms and stress of a serious illness. The goal is to improve quality of life for both the patient and the family.  His mother explained that the patient had used drugs and alcohol and she understood that contributed to his poor health. The patient was married at one time but his wife passed away 4-5 years ago.  He has no children.  He mother stated his friends made it very difficult for him to stay away from drugs and alcohol.     We discussed his current illness and what it means in the larger context of his on-going co-morbidities.  Natural disease trajectory and expectations at EOL were discussed.  The family understands that his heart is very weak and that his kidney is failing.  Last year the patient was given a life  expectancy of two months.  The family states he is proud to have surpassed that expectation and that he is prepared to die when God calls him.  Advanced directives and code status were discussed.  The patient chose his mother to be his medical surrogate.  Legally she is his surrogate by default.  As he is so chronically ill she does not want resuscitation attempted on him if he goes into cardiac or respiratory arrest.  Hospice and Palliative Care services outpatient were explained and offered.  His mother, cousin, and sister feel that additional support from hospice at [facility] is an excellent idea and they have requested that the Hospital refer the patient for Hospice services at [facility] on D/C.   Questions and concerns were addressed. The family was encouraged to call with questions or concerns.    PMT will check on [name] again tomorrow.    Primary Decision Maker:  NEXT OF KIN Mother, [name]    SUMMARY OF RECOMMENDATIONS     Change code status to DNR.  Please complete a [place]and[place] Gemma DNR form.  Discharge to [facility] long term care when  patient's condition has been optimized  Refer to Hospice services at [facility] on discharge  Code Status/Advance Care Planning:  DNR   Symptom Management:   Patient appears comfortable.  Psycho-social/Spiritual:   Desire for further Chaplaincy support:  Yes requested.  Prognosis:   Less than 6 months given end stage heart failure, acute on chronic kidney failure, recurrent infections and hospitalization.  Discharge Planning:  [place] with Hospice      Primary Diagnoses: Present on Admission: . ARF (acute renal failure) ([place]) . Rectal bleeding . Edema . Altered mental status   I have reviewed the medical record, interviewed the patient and family, and examined the patient. The following aspects are pertinent.  Past Medical History:  Diagnosis Date  . CHF (congestive heart failure) (HCC)    EF  10-15%  . Coronary artery disease   . Depression   . Diabetes mellitus   . Hypertension   . Mental disorder   . MI (myocardial infarction) [facility]) 32   age 1 per patient   Social History   Socioeconomic History  . Marital status: Widowed    Spouse name: None  . Number of children: None  . Years of education: None  . Highest education level: None  Social Needs  . Financial resource strain: None  . Food insecurity - worry: None  . Food insecurity - inability: None  . Transportation needs - medical: None  . Transportation needs - non-medical: None  Occupational History  . None  Tobacco Use  . Smoking status: Current Every Day Smoker    Packs/day: 0.50    Years: 40.00    Pack years: 20.00    Types: Cigarettes  . Smokeless tobacco: Never Used  Substance and Sexual Activity  . Alcohol use: Yes    Comment: 4 40 oz beers daily  . Drug use: Yes    Frequency: 7.0 times per week    Types: Marijuana, "Crack" cocaine    Comment: currently using  . Sexual activity: Yes  Other Topics Concern  . None  Social History Narrative   Reports being homeless x 2 weeks.    Family History  Problem Relation Age of Onset  . Heart attack Father        >60s  . Hypertension Father   . Diabetes Father   . Heart attack Sister   . Hypertension Mother    Scheduled Meds: . atorvastatin  20 mg Oral q1800  . carvedilol  3.125 mg Oral BID  . Chlorhexidine Gluconate Cloth  6 each Topical Q0600  . digoxin  0.0625 mg Oral Daily  . feeding supplement (ENSURE ENLIVE)  237 mL Oral TID BM  . feeding supplement (PRO-STAT SUGAR FREE 64)  30 mL Oral BID  . isosorbide mononitrate  30 mg Oral Daily  . lactulose  30 g Oral Once  . levothyroxine  25 mcg Oral QAC breakfast  . LORazepam  0.5 mg Intravenous Once  . mupirocin ointment  1 application Nasal BID  . pantoprazole (PROTONIX) IV  40 mg Intravenous Q12H  . sertraline  100 mg Oral QHS  . sodium bicarbonate  650 mg Oral BID  . sodium chloride  flush  3 mL Intravenous Q12H   Continuous Infusions: . sodium chloride    . ceFEPime (MAXIPIME) IV Stopped (05/15/17 0955)   PRN Meds:.sodium chloride, acetaminophen **OR** acetaminophen, sodium chloride flush No Known Allergies Review of Systems patient does not complain of pain or discomfort.  Otherwise he is too lethargic  to continue the conversation.  Physical Exam  Well developed, chronically ill appearing, lethargic.  Does not open eyes,  Will offer 1 syllable answers to questions CV faint, rrr resp no distress Abdomen slightly firm, not distended, NT Extremities 1-2 + edema  Vital Signs: BP 123/73   Pulse 85   Temp (!) 97 F (36.1 C) (Rectal)   Resp (!) 21   Ht _0  (1.88 m)   Wt 92.4 kg (203 lb 11.3 oz)   SpO2 97%   BMI 26.15 kg/m  Pain Assessment: No/denies pain       SpO2: SpO2: 97 % O2 Device:SpO2: 97 % O2 Flow Rate: .   IO: Intake/output summary:   Intake/Output Summary (Last 24 hours) at 05/15/2017 1412 Last data filed at 05/15/2017 0553 Gross per 24 hour  Intake 548.25 ml  Output -  Net 548.25 ml    LBM:   Baseline Weight: Weight: 92.4 kg (203 lb 11.3 oz) Most recent weight: Weight: 92.4 kg (203 lb 11.3 oz)     Palliative Assessment/Data: 20%     Time In: 12:30 Time Out: 1:40  Time Total: 70 min. Greater than 50%  of this time was spent counseling and coordinating care related to the above assessment and plan.  Signed by: Florentina Jenny, PA-C Palliative Medicine Pager: [phone]  Please contact Palliative Medicine Team phone at [phone] for questions and concerns.  For individual provider: See Shea Evans

## 2017-05-15 NOTE — Progress Notes (Signed)
PROGRESS NOTE    KHARTER BREW  ZOX:[id number] DOB: 1955-10-23 DOA: 06/10/2017 PCP: [facility]   Brief Narrative: [name]  is a 62 y.o. male, w CHF(EF 10-15%), CAD, Dm2, Hypertension, hx of prostate abscess s/p drainage, 02/18/2017,  ? Dementia, apparently has had altered mental status per sister over the past 1 day.   Pt has also had slight increase in lower ext edema over the past 1 month and rectal bleeding starting today.  Pt does have history of hemorrhoids,  His sister can't recall if he had has a colonoscopy in the past   Pt denies fever, chills, cp, palp, sob, n/v, abd pain, diarrhea, black stool.  Pt was brought in for rectal bleeding on toilet paper and in toilet per his sister.    In ED,  Wbc 5.1, Hgb 11.5, Plt 208. Na 141, K 4.5, Hco3 17, Bun 46, Creatinine 2.28. Albumin 3.0. Ast 40, Alt 21. Urinalysis negative. Ammonia 51. ABG ph 7.385, Co2 32.4, O2 29 (venous)    Assessment & Plan:   Principal Problem:   ARF (acute renal failure) (HCC) Active Problems:   Cardiomyopathy, nonischemic (HCC)   Rectal bleeding   Edema   Altered mental status   1-Acute encephalopathy; multifactorial, hypoglycemia, hypothermia, infection.  Ammonia initially elevated at 50---decreased to 18. On lactulose.  Treating underline pathology.  Elevated TSH;; free T 3 1.3 low, and Free T 4 normal.  CT head no acute abnormalities.  Improving, alert, answering questions.   2-Hypoglycemia; treated with  D 5 low rate. Resolved. Tolerating diet.   3-Hypothermia; rule out sepsis. He report cough, chest x ray with atelectasis vs consolidation.  lactic acid 1.1. Pro-calcitonin less than 0.1.  He was started on  IV antibiotics to cover for infection. PNA.  legionella antigen pending,  strept pneumonia negative.   Follow blood culture no growth to date.  UA negative.  Off Bear hugger. Started on low synthroid.  Will discontinue vancomycin 1-21. Continue with cefepime for now.    4-Rectal Bleeding. No further episode per nurse report. Monitor closely. Repeat HB tonight, transfusion as indicated. Continue with Protonix.  Hold xarelto.  Might need GI evaluation when stable.  Hb stable.   5-AKI; renal US negative for obstruction. Last cr per records at 1.5---1.2 Received IV fluids.  Stop IV fluids.  Nephrology consulted, might need IV lasix.   6-Systolic Heart failure; EF 15 % ECHO 07-2016  Initially holding  lasix due to AKI and concern for sepsis.  Monitor for pulmonary edema.  Nephrology consulted to help with diuresis.   7-History of PE; 12-04-2016; was on xarelto prior to admission.  He has received 5 month treatment.  Monitor for GI bleed. Consider resuming when renal function and hb stable.   8-Hypothyroidism; TSH elevated at 5, Free T 3 low. He was admitted with hypothermia. Started low dose synthroid.     DVT prophylaxis: SCD. Code Status: Full code.  Family Communication: D/W patient.  Disposition Plan: remain inpatient, step down unit.   Consultants:   none  Procedures:  Renal US; Increased echogenicity of bilateral kidneys consistent with medical renal disease.  Mild left renal pelviectasis.  Moderate abdominopelvic ascites.     Antimicrobials:   Vancomycin  Cefepime.    Subjective: He is alert, feel he has something in his throat. Denies dyspnea.  No further rectal bleeding per nurse.   Objective: Vitals:   05/15/17 0252 05/15/17 0545 05/15/17 0829 05/15/17 0935  BP: (!) 147/88  Pulse:    85  Resp: 20     Temp:  (!) 96.7 F (35.9 C) (!) 96.5 F (35.8 C)   TempSrc:  Rectal Rectal   SpO2:      Weight:      Height:        Intake/Output Summary (Last 24 hours) at 05/15/2017 0952 Last data filed at 05/15/2017 0553 Gross per 24 hour  Intake 548.25 ml  Output -  Net 548.25 ml   Filed Weights   05/20/2017 2232  Weight: 92.4 kg (203 lb 11.3 oz)    Examination:  General exam: Alert, in no distress.   Respiratory system: normal respiratory effort, bilateral crackles.  Cardiovascular system: S 1, S 2 RRR, plus 2 edema. Gastrointestinal system: BS present, soft, nt Central nervous system: alert, follows command.  Extremities: moves Bilateral LE  Skin: No rashes, lesions or ulcers    Data Reviewed: I have personally reviewed following labs and imaging studies  CBC: Recent Labs  Lab 05/02/2017 1550 05/14/17 0001 05/14/17 1807 05/15/17 0821  WBC 5.1 4.5  --  5.1  HGB 11.5* 10.9* 10.4* 11.2*  HCT 34.0* 32.7* 30.9* 33.3*  MCV 86.3 85.8  --  85.8  PLT 208 206  --  197   Basic Metabolic Panel: Recent Labs  Lab 05/22/2017 1550 05/14/17 0001 05/15/17 0821  NA 141 140 139  K 4.5 4.1 4.1  CL 110 111 109  CO2 17* 19* 18*  GLUCOSE 83 75 187*  BUN 46* 48* 49*  CREATININE 2.28* 2.23* 2.13*  CALCIUM 8.5* 8.5* 8.4*   GFR: Estimated Creatinine Clearance: 41.8 mL/min (A) (by C-G formula based on SCr of 2.13 mg/dL (H)). Liver Function Tests: Recent Labs  Lab 05/12/2017 1550 05/14/17 0001  AST 40 34  ALT 21 22  ALKPHOS 87 80  BILITOT 0.8 1.0  PROT 7.3 7.0  ALBUMIN 3.0* 2.8*   No results for input(s): LIPASE, AMYLASE in the last 168 hours. Recent Labs  Lab 05/02/2017 1616 05/14/17 0001  AMMONIA 51* 18   Coagulation Profile: Recent Labs  Lab 05/14/17 0858  INR 1.53   Cardiac Enzymes: No results for input(s): CKTOTAL, CKMB, CKMBINDEX, TROPONINI in the last 168 hours. BNP (last 3 results) No results for input(s): PROBNP in the last 8760 hours. HbA1C: No results for input(s): HGBA1C in the last 72 hours. CBG: Recent Labs  Lab 05/14/17 0835 05/14/17 1221 05/14/17 1810 05/14/17 2119  GLUCAP 67 168* 139* 166*   Lipid Profile: No results for input(s): CHOL, HDL, LDLCALC, TRIG, CHOLHDL, LDLDIRECT in the last 72 hours. Thyroid Function Tests: Recent Labs    05/14/17 0001 05/14/17 0858  TSH 5.706*  --   FREET4  --  1.07  T3FREE  --  1.3*   Anemia Panel: Recent  Labs    05/14/17 0001  VITAMINB12 258   Sepsis Labs: Recent Labs  Lab 05/14/17 0858 05/14/17 1156  PROCALCITON <0.10  --   LATICACIDVEN 1.1 1.2    Recent Results (from the past 240 hour(s))  Blood Cultures (routine x 2)     Status: None (Preliminary result)   Collection Time: 05/02/2017  4:22 PM  Result Value Ref Range Status   Specimen Description BLOOD BLOOD RIGHT FOREARM  Final   Special Requests AEROBIC BOTTLE ONLY Blood Culture adequate volume  Final   Culture NO GROWTH < 24 HOURS  Final   Report Status PENDING  Incomplete  Urine culture     Status: None   Collection  Time: 05/06/2017  5:40 PM  Result Value Ref Range Status   Specimen Description URINE, CATHETERIZED  Final   Special Requests NONE  Final   Culture NO GROWTH  Final   Report Status 05/14/2017 FINAL  Final  Blood Cultures (routine x 2)     Status: None (Preliminary result)   Collection Time: 04/25/2017  5:40 PM  Result Value Ref Range Status   Specimen Description BLOOD BLOOD RIGHT FOREARM  Final   Special Requests IN PEDIATRIC BOTTLE Blood Culture adequate volume  Final   Culture NO GROWTH < 24 HOURS  Final   Report Status PENDING  Incomplete  MRSA PCR Screening     Status: Abnormal   Collection Time: 05/15/2017 10:39 PM  Result Value Ref Range Status   MRSA by PCR POSITIVE (A) NEGATIVE Final    Comment: RN,ALECIA POTEAT [id number] @0210  THANEY        The GeneXpert MRSA Assay (FDA approved for NASAL specimens only), is one component of a comprehensive MRSA colonization surveillance program. It is not intended to diagnose MRSA infection nor to guide or monitor treatment for MRSA infections.          Radiology Studies: Dg Chest 2 View  Result Date: 05/11/2017 CLINICAL DATA:  62 year old male with history of altered mental status. Rectal bleeding. Lethargy. EXAM: CHEST  2 VIEW COMPARISON:  Chest x-ray 04/10/2017. FINDINGS: Lung volumes are low. Bibasilar opacities (right greater than left), similar to  the prior study, which may reflect areas of atelectasis and/or airspace consolidation, with superimposed small to moderate bilateral pleural effusions. No evidence of pulmonary edema. Heart size appears borderline enlarged. Upper mediastinal contours are within normal limits. IMPRESSION: 1. Low lung volumes with small to moderate bilateral pleural effusions and associated areas of atelectasis and/or consolidation in the lung bases bilaterally, very similar to prior study from 04/10/2017. Electronically Signed   By: Trudie Reed M.D.   On: 05/04/2017 17:23   Ct Head Wo Contrast  Result Date: 05/22/2017 CLINICAL DATA:  Headache EXAM: CT HEAD WITHOUT CONTRAST TECHNIQUE: Contiguous axial images were obtained from the base of the skull through the vertex without intravenous contrast. COMPARISON:  10/23/2008 FINDINGS: Brain: No evidence of acute infarction, hemorrhage, hydrocephalus, extra-axial collection or mass lesion/mass effect. Moderate generalized atrophy. Moderate low-density in the cerebral white matter, occipital parietal predominant and compatible with chronic small vessel ischemia given patient's risk factors. Left superior temporal gliosis that is likely post ischemic. Vascular: Atherosclerotic calcification.  No hyperdense vessel. Skull: No acute or aggressive finding. Sinuses/Orbits: Negative IMPRESSION: 1. No acute finding or explanation for headache. 2. Atrophy and ischemic injury that has progressed from 2010. Electronically Signed   By: Marnee Spring M.D.   On: 04/30/2017 17:56   US Renal  Result Date: 05/12/2017 CLINICAL DATA:  Acute renal failure. EXAM: RENAL / URINARY TRACT ULTRASOUND COMPLETE COMPARISON:  08/08/2016 FINDINGS: Right Kidney: Length: 12.2 cm. Increased echogenicity. No mass or hydronephrosis visualized. Left Kidney: Length: 11.0 cm. Increased echogenicity. No mass. Mild left pelviectasis. Bladder: Appears normal for degree of bladder distention. Bilateral ureteral jets  are not seen. Moderate amount of abdominopelvic ascites. Bilateral pleural effusions. IMPRESSION: Increased echogenicity of bilateral kidneys consistent with medical renal disease. Mild left renal pelviectasis. Moderate abdominopelvic ascites. Bilateral pleural effusions. Electronically Signed   By: Ted Mcalpine M.D.   On: 05/22/2017 21:41        Scheduled Meds: . atorvastatin  20 mg Oral q1800  . carvedilol  3.125 mg Oral BID  .  Chlorhexidine Gluconate Cloth  6 each Topical Q0600  . digoxin  0.0625 mg Oral Daily  . feeding supplement (ENSURE ENLIVE)  237 mL Oral TID BM  . feeding supplement (PRO-STAT SUGAR FREE 64)  30 mL Oral BID  . isosorbide mononitrate  30 mg Oral Daily  . lactulose  30 g Oral Once  . levothyroxine  25 mcg Oral QAC breakfast  . LORazepam  0.5 mg Intravenous Once  . mupirocin ointment  1 application Nasal BID  . pantoprazole (PROTONIX) IV  40 mg Intravenous Q12H  . sertraline  100 mg Oral QHS  . sodium bicarbonate  650 mg Oral BID  . sodium chloride flush  3 mL Intravenous Q12H   Continuous Infusions: . sodium chloride    . ceFEPime (MAXIPIME) IV 2 g (05/15/17 0925)     LOS: 2 days    Time spent: 35 minutes.     Alba Cory, MD [facility] Pager [phone]  If 7PM-7AM, please contact night-coverage [email or web address] Password [facility] 05/15/2017, 9:52 AM

## 2017-05-15 NOTE — Progress Notes (Signed)
Initial Nutrition Assessment  DOCUMENTATION CODES:   Not applicable  INTERVENTION:   - Continue Ensure Enlive po BID, each supplement provides 350 kcal and 20 grams of protein - Continue Pro-Stat 30 mL BID - Encourage PO intake  NUTRITION DIAGNOSIS:   Inadequate oral intake related to lethargy/confusion as evidenced by other (comment)(meal completion </= 75%).  GOAL:   Patient will meet greater than or equal to 90% of their needs  MONITOR:   Supplement acceptance, PO intake, Labs  REASON FOR ASSESSMENT:   Malnutrition Screening Tool   ASSESSMENT:   62 yo male who presents with rectal bleeding and AMS and admitted for ARF. Pt with PMH of AKI, MI, PE, CHF, CAD, DM type II, depression, polysubstance abuse, prostate abscess s/p drainage October 2018, and HTN.  Spoke with pt at bedside but unable to understand pt's communications. Pt groaning at time of visit and nods when RD asks if he is in pain. Unable to obtain diet history and weight history at time of visit. No family present at bedside. Spoke with RN who reports pt admitted from a SNF. RN unsure of pt's weight history.  Meal completion logged as 50-75% since admission. Pt with unfinished lunch tray in room at time of RD visit (25% completed).  Unable to perform NFPE as pt groaned in pain when touched.  Pt currently receiving Ensure Enlive oral nutrition supplement TID between meals and Pro-stat 30 mL BID.  Medications reviewed and include: Lipitor, lactulose, Synthroid, Protonix  Labs reviewed: BUN 49 (H), creatinine 2.13 (L), calcium 8.4 (L), T3 1.3 (L), TSH 5.706 (H) CBG's: 202, 166, 139, 168, 97  NUTRITION - FOCUSED PHYSICAL EXAM:    Most Recent Value  Orbital Region  Unable to assess  Upper Arm Region  Unable to assess  Thoracic and Lumbar Region  Unable to assess  Buccal Region  Unable to assess  [place] Region  Unable to assess  Clavicle Bone Region  Unable to assess  Clavicle and Acromion Bone Region   Unable to assess  Scapular Bone Region  Unable to assess  Dorsal Hand  Unable to assess  Patellar Region  Unable to assess  Anterior Thigh Region  Unable to assess  Posterior Calf Region  Unable to assess  Hair  Unable to assess  Eyes  Unable to assess  Mouth  Unable to assess  Skin  Unable to assess  Nails  Unable to assess       Diet Order:  Diet heart healthy/carb modified Room service appropriate? Yes; Fluid consistency: Thin  EDUCATION NEEDS:   Not appropriate for education at this time  Skin:  Skin Assessment: Reviewed RN Assessment  Last BM:  Unknown  Height:   Ht Readings from Last 1 Encounters:  05/09/2017 6\' 2"  (1.88 m)    Weight:   Wt Readings from Last 1 Encounters:  05/19/2017 203 lb 11.3 oz (92.4 kg)    Ideal Body Weight:  86.4 kg  BMI:  Body mass index is 26.15 kg/m.  Estimated Nutritional Needs:   Kcal:  2200-2400 kcal/day  Protein:  110-120 grams/day  Fluid:  >2.0 L/day    Earma Reading, MS, RD, LDN Clinical Dietitian Pager: [phone] Weekend/After Hours: [phone]

## 2017-05-16 LAB — RENAL FUNCTION PANEL
ALBUMIN: 2.5 g/dL — AB (ref 3.5–5.0)
ANION GAP: 10 (ref 5–15)
BUN: 55 mg/dL — ABNORMAL HIGH (ref 6–20)
CO2: 21 mmol/L — ABNORMAL LOW (ref 22–32)
Calcium: 8.4 mg/dL — ABNORMAL LOW (ref 8.9–10.3)
Chloride: 110 mmol/L (ref 101–111)
Creatinine, Ser: 1.95 mg/dL — ABNORMAL HIGH (ref 0.61–1.24)
GFR calc Af Amer: 41 mL/min — ABNORMAL LOW (ref >60)
GFR, EST NON AFRICAN AMERICAN: 35 mL/min — AB (ref >60)
Glucose, Bld: 155 mg/dL — ABNORMAL HIGH (ref 65–99)
POTASSIUM: 4.8 mmol/L (ref 3.5–5.1)
Phosphorus: 3.6 mg/dL (ref 2.5–4.6)
Sodium: 141 mmol/L (ref 135–145)

## 2017-05-16 LAB — RPR, QUANT+TP ABS (REFLEX)
Rapid Plasma Reagin, Quant: 1:4 {titer} — ABNORMAL HIGH
T Pallidum Abs: POSITIVE — AB

## 2017-05-16 LAB — LEGIONELLA PNEUMOPHILA SEROGP 1 UR AG: L. pneumophila Serogp 1 Ur Ag: NEGATIVE

## 2017-05-16 LAB — RPR: RPR Ser Ql: REACTIVE — AB

## 2017-05-16 MED ORDER — DM-GUAIFENESIN ER 30-600 MG PO TB12
1.0000 | ORAL_TABLET | Freq: Two times a day (BID) | ORAL | Status: DC
  Administered 2017-05-16 – 2017-05-17 (×3): 1 via ORAL
  Filled 2017-05-16 (×3): qty 1

## 2017-05-16 MED ORDER — MENTHOL 3 MG MT LOZG: 1.0000 | LOZENGE | OROMUCOSAL | Status: DC | PRN

## 2017-05-16 MED ORDER — OXYMETAZOLINE HCL 0.05 % NA SOLN
1.0000 | Freq: Two times a day (BID) | NASAL | Status: DC
  Administered 2017-05-16 – 2017-05-19 (×7): 1 via NASAL
  Filled 2017-05-16: qty 15

## 2017-05-16 MED ORDER — SENNA 8.6 MG PO TABS
2.0000 | ORAL_TABLET | Freq: Every day | ORAL | Status: DC
  Administered 2017-05-16 – 2017-05-17 (×2): 17.2 mg via ORAL
  Filled 2017-05-16 (×3): qty 2

## 2017-05-16 MED ORDER — HYDROCORTISONE ACE-PRAMOXINE 1-1 % RE FOAM
1.0000 | Freq: Two times a day (BID) | RECTAL | Status: DC
  Administered 2017-05-16 – 2017-05-19 (×2): 1 via RECTAL
  Filled 2017-05-16: qty 10

## 2017-05-16 NOTE — Plan of Care (Signed)
  Progressing Education: Knowledge of General Education information will improve 05/16/2017 0554 - Progressing by Burtis Junes, RN Clinical Measurements: Ability to maintain clinical measurements within normal limits will improve 05/16/2017 0554 - Progressing by Burtis Junes, RN Diagnostic test results will improve 05/16/2017 0554 - Progressing by Burtis Junes, RN Nutrition: Adequate nutrition will be maintained 05/16/2017 0554 - Progressing by Burtis Junes, RN Coping: Level of anxiety will decrease 05/16/2017 0554 - Progressing by Burtis Junes, RN Skin Integrity: Risk for impaired skin integrity will decrease 05/16/2017 0554 - Progressing by Burtis Junes, RN Excoration to posterior scrotum, applied barrier ointment after incontinent care/bath.

## 2017-05-16 NOTE — Progress Notes (Signed)
Visited with patient and had prayer with him.  Short visit due to others caring for him. Patient seemed anxious. Phebe Colla, Chaplain   05/16/17 1000  Clinical Encounter Type  Visited With Patient  Visit Type Initial;Spiritual support  Referral From Nurse  Consult/Referral To Chaplain  Spiritual Encounters  Spiritual Needs Prayer;Emotional

## 2017-05-16 NOTE — Progress Notes (Signed)
Temp 96.7 rectally. MD notified, verbal order to apply bair hugger. Will continue to monitor patient temperature.

## 2017-05-16 NOTE — Progress Notes (Addendum)
  Follow up visit  Patient sitting up and attempting to eat breakfast.  He complains that it is cold.  He complains of throat pain and nasal congestion along with a little rectal bleeding last night.  States he has irritation at his anus and the bleeding comes with bowel movements.  He reports having hemorrhoids in the past.  PE Awake alert, orientated, with clear speech CV rrr resp no distress, no w/c/r Abdomen soft nt, nd  Recommendations:  Will add afrin nasal spray, cepacol cough drops and mucinex.  In addition for probable hemorrhoids will add proctofoam.  Prognosis:  Less than 6 months is reasonable given end stage heart failure, acute on chronic kidney failure, recurrent infections and hospitalizations.  Disposition:  Plan for back to [facility] with [facility] when he is optimized from a medical stand point.  Spoke with sister Lupita Leash to provide update.  Discussed with [facility] attending MD.  Norvel Richards, PA-C Palliative Medicine Pager: [phone]  Total time 25 min.

## 2017-05-16 NOTE — Progress Notes (Signed)
Subjective:    UOP not well recorded- weight seems stable- crt down but BUN up- seems less oriented today to me  Objective Vital signs in last 24 hours: Vitals:   05/16/17 0503 05/16/17 0505 05/16/17 0905 05/16/17 1058  BP:  126/88 (!) 135/49   Pulse:   87   Resp:  20 13   Temp:  (!) 97.4 F (36.3 C)  (!) 96.7 F (35.9 C)  TempSrc:  Oral  Rectal  SpO2:  99% 96%   Weight: 92.3 kg (203 lb 7.8 oz)     Height:       Weight change:   Intake/Output Summary (Last 24 hours) at 05/16/2017 1223 Last data filed at 05/16/2017 0500 Gross per 24 hour  Intake 3 ml  Output 400 ml  Net -397 ml   Assessment/Plan: 62 year old black male with multiple medical issues most notably including an ejection fraction of 10-15% and some CKD at baseline.  He presents now with acute on chronic renal failure and now volume overload 1.Renal-patient with a change in creatinine, worsening renal function most likely associated with cardiorenal syndrome  as urinalysis is completely negative.  Thankfully kidney function stable and not worsening.  Patient would not be considered a dialysis candidate secondary to his significant cardiomyopathy 2. Hypertension/volume  -he is volume overloaded at this time.  on Lasix 40 mg IV every 12 hours- difficult to tell if has made impact-  he is also on very low-dose Coreg but otherwise nothing to lower blood pressure to give him ATN- no change for now 3.  Metabolic acidosis-on oral bicarb-to continue - improved 4. Anemia  -not below the threshold of treatment at this time    [name] A    Labs: Basic Metabolic Panel: Recent Labs  Lab 05/14/17 0001 05/15/17 0821 05/16/17 0505  NA 140 139 141  K 4.1 4.1 4.8  CL 111 109 110  CO2 19* 18* 21*  GLUCOSE 75 187* 155*  BUN 48* 49* 55*  CREATININE 2.23* 2.13* 1.95*  CALCIUM 8.5* 8.4* 8.4*  PHOS  --   --  3.6   Liver Function Tests: Recent Labs  Lab June 06, 2017 1550 05/14/17 0001 05/16/17 0505  AST 40 34  --    ALT 21 22  --   ALKPHOS 87 80  --   BILITOT 0.8 1.0  --   PROT 7.3 7.0  --   ALBUMIN 3.0* 2.8* 2.5*   No results for input(s): LIPASE, AMYLASE in the last 168 hours. Recent Labs  Lab 06-06-2017 1616 05/14/17 0001  AMMONIA 51* 18   CBC: Recent Labs  Lab 2017-06-06 1550 05/14/17 0001 05/14/17 1807 05/15/17 0821  WBC 5.1 4.5  --  5.1  HGB 11.5* 10.9* 10.4* 11.2*  HCT 34.0* 32.7*  32.7* 30.9* 33.3*  MCV 86.3 85.8  --  85.8  PLT 208 206  --  197   Cardiac Enzymes: No results for input(s): CKTOTAL, CKMB, CKMBINDEX, TROPONINI in the last 168 hours. CBG: Recent Labs  Lab 05/14/17 0835 05/14/17 1221 05/14/17 1810 05/14/17 2119 05/15/17 1049  GLUCAP 67 168* 139* 166* 202*    Iron Studies: No results for input(s): IRON, TIBC, TRANSFERRIN, FERRITIN in the last 72 hours. Studies/Results: No results found. Medications: Infusions: . sodium chloride    . ceFEPime (MAXIPIME) IV Stopped (05/16/17 1056)    Scheduled Medications: . atorvastatin  20 mg Oral q1800  . carvedilol  3.125 mg Oral BID  . Chlorhexidine Gluconate Cloth  6 each Topical Q0600  .  dextromethorphan-guaiFENesin  1 tablet Oral BID  . digoxin  0.0625 mg Oral Daily  . feeding supplement (ENSURE ENLIVE)  237 mL Oral TID BM  . feeding supplement (PRO-STAT SUGAR FREE 64)  30 mL Oral BID  . furosemide  40 mg Intravenous Q12H  . hydrocortisone-pramoxine  1 applicator Rectal BID  . isosorbide mononitrate  30 mg Oral Daily  . lactulose  30 g Oral Once  . levothyroxine  25 mcg Oral QAC breakfast  . LORazepam  0.5 mg Intravenous Once  . mupirocin ointment  1 application Nasal BID  . oxymetazoline  1 spray Each Nare BID  . pantoprazole (PROTONIX) IV  40 mg Intravenous Q12H  . senna  2 tablet Oral QHS  . sertraline  100 mg Oral QHS  . sodium bicarbonate  650 mg Oral BID  . sodium chloride flush  3 mL Intravenous Q12H    have reviewed scheduled and prn medications.  Physical Exam: General: difficult to  understand speech  Heart: RRR Lungs: mostly clear Abdomen: soft, non tender Extremities: pitting edema    05/16/2017,12:23 PM  LOS: 3 days

## 2017-05-16 NOTE — Progress Notes (Signed)
PROGRESS NOTE    [name]  UJW:[id number] DOB: Sep 27, 1955 DOA: 05/07/2017 PCP: [facility]   Brief Narrative: [name]  is a 62 y.o. male, w CHF(EF 10-15%), CAD, Dm2, Hypertension, hx of prostate abscess s/p drainage, 02/18/2017,  ? Dementia, apparently has had altered mental status per sister over the past 1 day.   Pt has also had slight increase in lower ext edema over the past 1 month and rectal bleeding starting today.  Pt does have history of hemorrhoids,  His sister can't recall if he had has a colonoscopy in the past   Pt denies fever, chills, cp, palp, sob, n/v, abd pain, diarrhea, black stool.  Pt was brought in for rectal bleeding on toilet paper and in toilet per his sister.    In ED,  Wbc 5.1, Hgb 11.5, Plt 208. Na 141, K 4.5, Hco3 17, Bun 46, Creatinine 2.28. Albumin 3.0. Ast 40, Alt 21. Urinalysis negative. Ammonia 51. ABG ph 7.385, Co2 32.4, O2 29 (venous)  Patient admitted with hypothermia, hypoglycemia, AMS and AKI. He was started on IV fluids, IV antibiotics to cover for infection. Also regarding rectal bleeding. Hb has remain stable. Xarelto has been on hold.  Palliative care was consulted. Plan is for Nursing home placement when patient stable, with hospice follow up.   Assessment & Plan:   Principal Problem:   ARF (acute renal failure) (HCC) Active Problems:   Cardiomyopathy, nonischemic (HCC)   Rectal bleeding   Edema   Altered mental status   DNR (do not resuscitate)   1-Acute encephalopathy; multifactorial, hypoglycemia, hypothermia, infection.  Ammonia initially elevated at 50---decreased to 18. On lactulose.  Treating underline pathology.  Elevated TSH;; free T 3 1.3 low, and Free T 4 normal.  CT head no acute abnormalities.  Improving. He is more alert.   2-Hypoglycemia; treated with  D 5 low rate. Resolved. Tolerating diet.   3-Hypothermia; rule out sepsis. He report cough, chest x ray with atelectasis vs consolidation.  lactic  acid 1.1. Pro-calcitonin less than 0.1.  He was started on  IV antibiotics to cover for infection. PNA.  legionella antigen  strept pneumonia negative.   Follow blood culture no growth to date.  UA negative.  Started on low synthroid.  Will discontinue vancomycin 1-21. Continue with cefepime for now.  Back on bear hugger/ for hypothermia.   4-Rectal Bleeding. No further episode per nurse report. Monitor closely. Repeat HB tonight, transfusion as indicated. Continue with Protonix.  Hold xarelto.  Hb stable.   5-AKI; related to cardiorenal syndrome  renal US negative for obstruction. Last cr per records at 1.5---1.2 Not candidate for HD due to cardiomyopathy EF 15 %  Received IV fluids.  Stop IV fluids.  Nephrology consulted. Appreciate nephrology help.  Started on IV lasix. Cr stable.    6-Systolic Heart failure; EF 15 % ECHO 07-2016  Initially holding  lasix due to AKI and concern for sepsis.  Monitor for pulmonary edema.  Nephrology consulted to help with diuresis.  Started on IV lasix.   7-History of PE; 12-04-2016; was on xarelto prior to admission.  He has received 5 month treatment.  Monitor for GI bleed. Consider resuming when renal function and hb stable.   8-Hypothyroidism; TSH elevated at 5, Free T 3 low. He was admitted with hypothermia. Started low dose synthroid.     DVT prophylaxis: SCD. Code Status: Full code.  Family Communication: D/W patient.  Disposition Plan: remain inpatient, step down unit.  Consultants:   none  Procedures:  Renal US; Increased echogenicity of bilateral kidneys consistent with medical renal disease.  Mild left renal pelviectasis.  Moderate abdominopelvic ascites.     Antimicrobials:   Vancomycin  Cefepime.    Subjective: He is alsert, mild dyspnea. Coughing phlegm   Objective: Vitals:   05/16/17 1227 05/16/17 1321 05/16/17 1332 05/16/17 1454  BP:  133/86    Pulse:  73    Resp:  (!) 25    Temp: (!) 96.8 F  (36 C)  (!) 96.8 F (36 C) (!) 96.8 F (36 C)  TempSrc: Rectal  Rectal Rectal  SpO2:  100%    Weight:      Height:        Intake/Output Summary (Last 24 hours) at 05/16/2017 1508 Last data filed at 05/16/2017 0500 Gross per 24 hour  Intake 3 ml  Output 400 ml  Net -397 ml   Filed Weights   Jun 09, 2017 2232 05/16/17 0503  Weight: 92.4 kg (203 lb 11.3 oz) 92.3 kg (203 lb 7.8 oz)    Examination:  General exam: Alert, in no distress.  Respiratory system: Bilateral ronchus, crackles.  Cardiovascular system: S 1, S 2 RRR, Gastrointestinal system: BS present, soft, distended.  Central nervous system: Alert.  Extremities: moves Bilateral LE  Skin: No rashes, lesions or ulcers    Data Reviewed: I have personally reviewed following labs and imaging studies  CBC: Recent Labs  Lab June 09, 2017 1550 05/14/17 0001 05/14/17 1807 05/15/17 0821  WBC 5.1 4.5  --  5.1  HGB 11.5* 10.9* 10.4* 11.2*  HCT 34.0* 32.7*  32.7* 30.9* 33.3*  MCV 86.3 85.8  --  85.8  PLT 208 206  --  197   Basic Metabolic Panel: Recent Labs  Lab 06-09-17 1550 05/14/17 0001 05/15/17 0821 05/16/17 0505  NA 141 140 139 141  K 4.5 4.1 4.1 4.8  CL 110 111 109 110  CO2 17* 19* 18* 21*  GLUCOSE 83 75 187* 155*  BUN 46* 48* 49* 55*  CREATININE 2.28* 2.23* 2.13* 1.95*  CALCIUM 8.5* 8.5* 8.4* 8.4*  PHOS  --   --   --  3.6   GFR: Estimated Creatinine Clearance: 45.7 mL/min (A) (by C-G formula based on SCr of 1.95 mg/dL (H)). Liver Function Tests: Recent Labs  Lab 09-Jun-2017 1550 05/14/17 0001 05/16/17 0505  AST 40 34  --   ALT 21 22  --   ALKPHOS 87 80  --   BILITOT 0.8 1.0  --   PROT 7.3 7.0  --   ALBUMIN 3.0* 2.8* 2.5*   No results for input(s): LIPASE, AMYLASE in the last 168 hours. Recent Labs  Lab 06/09/17 1616 05/14/17 0001  AMMONIA 51* 18   Coagulation Profile: Recent Labs  Lab 05/14/17 0858  INR 1.53   Cardiac Enzymes: No results for input(s): CKTOTAL, CKMB, CKMBINDEX, TROPONINI  in the last 168 hours. BNP (last 3 results) No results for input(s): PROBNP in the last 8760 hours. HbA1C: No results for input(s): HGBA1C in the last 72 hours. CBG: Recent Labs  Lab 05/14/17 0835 05/14/17 1221 05/14/17 1810 05/14/17 2119 05/15/17 1049  GLUCAP 67 168* 139* 166* 202*   Lipid Profile: No results for input(s): CHOL, HDL, LDLCALC, TRIG, CHOLHDL, LDLDIRECT in the last 72 hours. Thyroid Function Tests: Recent Labs    05/14/17 0001 05/14/17 0858  TSH 5.706*  --   FREET4  --  1.07  T3FREE  --  1.3*   Anemia  Panel: Recent Labs    05/14/17 0001  VITAMINB12 258   Sepsis Labs: Recent Labs  Lab 05/14/17 0858 05/14/17 1156  PROCALCITON <0.10  --   LATICACIDVEN 1.1 1.2    Recent Results (from the past 240 hour(s))  Blood Cultures (routine x 2)     Status: None (Preliminary result)   Collection Time: 05/22/2017  4:22 PM  Result Value Ref Range Status   Specimen Description BLOOD BLOOD RIGHT FOREARM  Final   Special Requests AEROBIC BOTTLE ONLY Blood Culture adequate volume  Final   Culture NO GROWTH 3 DAYS  Final   Report Status PENDING  Incomplete  Urine culture     Status: None   Collection Time: 04/27/2017  5:40 PM  Result Value Ref Range Status   Specimen Description URINE, CATHETERIZED  Final   Special Requests NONE  Final   Culture NO GROWTH  Final   Report Status 05/14/2017 FINAL  Final  Blood Cultures (routine x 2)     Status: None (Preliminary result)   Collection Time: 05/12/2017  5:40 PM  Result Value Ref Range Status   Specimen Description BLOOD BLOOD RIGHT FOREARM  Final   Special Requests IN PEDIATRIC BOTTLE Blood Culture adequate volume  Final   Culture NO GROWTH 3 DAYS  Final   Report Status PENDING  Incomplete  MRSA PCR Screening     Status: Abnormal   Collection Time: 05/20/2017 10:39 PM  Result Value Ref Range Status   MRSA by PCR POSITIVE (A) NEGATIVE Final    Comment: RN,ALECIA POTEAT [id number] @0210  THANEY        The GeneXpert MRSA  Assay (FDA approved for NASAL specimens only), is one component of a comprehensive MRSA colonization surveillance program. It is not intended to diagnose MRSA infection nor to guide or monitor treatment for MRSA infections.          Radiology Studies: No results found.      Scheduled Meds: . atorvastatin  20 mg Oral q1800  . carvedilol  3.125 mg Oral BID  . Chlorhexidine Gluconate Cloth  6 each Topical Q0600  . dextromethorphan-guaiFENesin  1 tablet Oral BID  . digoxin  0.0625 mg Oral Daily  . feeding supplement (ENSURE ENLIVE)  237 mL Oral TID BM  . feeding supplement (PRO-STAT SUGAR FREE 64)  30 mL Oral BID  . furosemide  40 mg Intravenous Q12H  . hydrocortisone-pramoxine  1 applicator Rectal BID  . isosorbide mononitrate  30 mg Oral Daily  . lactulose  30 g Oral Once  . levothyroxine  25 mcg Oral QAC breakfast  . LORazepam  0.5 mg Intravenous Once  . mupirocin ointment  1 application Nasal BID  . oxymetazoline  1 spray Each Nare BID  . pantoprazole (PROTONIX) IV  40 mg Intravenous Q12H  . senna  2 tablet Oral QHS  . sertraline  100 mg Oral QHS  . sodium bicarbonate  650 mg Oral BID  . sodium chloride flush  3 mL Intravenous Q12H   Continuous Infusions: . sodium chloride    . ceFEPime (MAXIPIME) IV Stopped (05/16/17 1056)     LOS: 3 days    Time spent: 35 minutes.     Alba Cory, MD [facility] Pager [phone]  If 7PM-7AM, please contact night-coverage [email or web address] Password [email or web address] 05/16/2017, 3:08 PM

## 2017-05-17 DIAGNOSIS — Z515 Encounter for palliative care: Secondary | ICD-10-CM

## 2017-05-17 DIAGNOSIS — N179 Acute kidney failure, unspecified: Secondary | ICD-10-CM | POA: Diagnosis present

## 2017-05-17 LAB — CALCIUM, URINE, RANDOM: Calcium, Ur: <0.8 mg/dL

## 2017-05-17 MED ORDER — LORAZEPAM 2 MG/ML IJ SOLN
1.0000 mg | INTRAMUSCULAR | Status: DC | PRN
  Administered 2017-05-18 – 2017-05-21 (×10): 1 mg via INTRAVENOUS
  Filled 2017-05-17 (×10): qty 1

## 2017-05-17 MED ORDER — LORAZEPAM 2 MG/ML PO CONC
1.0000 mg | ORAL | Status: DC | PRN
  Administered 2017-05-21: 1 mg via SUBLINGUAL
  Filled 2017-05-17: qty 1

## 2017-05-17 MED ORDER — ONDANSETRON HCL 4 MG/2ML IJ SOLN: 4.0000 mg | Freq: Four times a day (QID) | INTRAMUSCULAR | Status: DC | PRN

## 2017-05-17 MED ORDER — GLYCOPYRROLATE 1 MG PO TABS
1.0000 mg | ORAL_TABLET | ORAL | Status: DC | PRN
  Filled 2017-05-17: qty 1

## 2017-05-17 MED ORDER — POLYVINYL ALCOHOL 1.4 % OP SOLN
1.0000 [drp] | Freq: Four times a day (QID) | OPHTHALMIC | Status: DC | PRN
  Filled 2017-05-17: qty 15

## 2017-05-17 MED ORDER — ONDANSETRON 4 MG PO TBDP: 4.0000 mg | ORAL_TABLET | Freq: Four times a day (QID) | ORAL | Status: DC | PRN

## 2017-05-17 MED ORDER — MORPHINE SULFATE (CONCENTRATE) 10 MG/0.5ML PO SOLN
5.0000 mg | Freq: Four times a day (QID) | ORAL | Status: DC
  Administered 2017-05-17 – 2017-05-19 (×7): 5 mg via SUBLINGUAL
  Administered 2017-05-19: 10 mg via SUBLINGUAL
  Administered 2017-05-19 – 2017-05-21 (×7): 5 mg via SUBLINGUAL
  Filled 2017-05-17 (×15): qty 0.5

## 2017-05-17 MED ORDER — GLYCOPYRROLATE 0.2 MG/ML IJ SOLN
0.2000 mg | INTRAMUSCULAR | Status: DC | PRN
  Filled 2017-05-17: qty 1

## 2017-05-17 MED ORDER — MORPHINE SULFATE (CONCENTRATE) 10 MG/0.5ML PO SOLN
5.0000 mg | ORAL | Status: DC | PRN
  Administered 2017-05-19: 5 mg via ORAL
  Filled 2017-05-17 (×2): qty 0.5

## 2017-05-17 MED ORDER — LORAZEPAM 1 MG PO TABS: 1.0000 mg | ORAL_TABLET | ORAL | Status: DC | PRN

## 2017-05-17 MED ORDER — MORPHINE SULFATE (CONCENTRATE) 10 MG/0.5ML PO SOLN
5.0000 mg | ORAL | Status: DC | PRN
  Administered 2017-05-17 – 2017-05-21 (×3): 5 mg via SUBLINGUAL
  Filled 2017-05-17 (×2): qty 0.5

## 2017-05-17 MED ORDER — BIOTENE DRY MOUTH MT LIQD: 15.0000 mL | OROMUCOSAL | Status: DC | PRN

## 2017-05-17 MED ORDER — HALOPERIDOL LACTATE 5 MG/ML IJ SOLN
0.5000 mg | INTRAMUSCULAR | Status: DC | PRN
  Administered 2017-05-17 – 2017-05-19 (×6): 0.5 mg via INTRAVENOUS
  Filled 2017-05-17 (×7): qty 1

## 2017-05-17 MED ORDER — HALOPERIDOL LACTATE 2 MG/ML PO CONC
0.5000 mg | ORAL | Status: DC | PRN
  Filled 2017-05-17: qty 0.3

## 2017-05-17 MED ORDER — HALOPERIDOL 0.5 MG PO TABS
0.5000 mg | ORAL_TABLET | ORAL | Status: DC | PRN
  Filled 2017-05-17: qty 1

## 2017-05-17 MED ORDER — GLYCOPYRROLATE 0.2 MG/ML IJ SOLN
0.2000 mg | INTRAMUSCULAR | Status: DC | PRN
  Administered 2017-05-19 – 2017-05-20 (×2): 0.2 mg via INTRAVENOUS
  Filled 2017-05-17: qty 1

## 2017-05-17 NOTE — Progress Notes (Signed)
CSW received consult for hospice placement- family first preference for [facility]- referral made  Burna Sis, LCSW Clinical Social Worker [phone]

## 2017-05-17 NOTE — Progress Notes (Signed)
Daily Progress Note   Patient Name: [name]       Date: 05/17/2017 DOB: Oct 10, 1955  Age: 62 y.o. MRN#: [id number] Attending Physician: Briant Cedar, MD Primary Care Physician: [facility] Admit Date: 05/08/2017  Reason for Consultation/Follow-up: Establishing goals of care  Subjective:  I received a call from the patient's mother and sister asking about him.  The patient's mother tells me she can not sleep with worry that he may go back to SNF.  She requests that we re-evaluate him for [facility].  Patient tells me his right ribs hurt badly.  He talks about a Programmer, multimedia that came to him over night and told him he was going to die.  He states he is not afraid of dying.  We talked about possibly going back to SNF.  He commented that he feels like he can not breathe.  He appreciates the hospital care and tells me he feels as though he is surrounded by angels.   We discussed Hospice House.  He seems Ok with Hospice HOuse   Assessment: Patient with end stage heart failure, encephalopathy, renal failure.  Has bair hugger on again today for low temperature.  He is speaking with me but is drifting off to sleep.  Eating sips and bites.   Patient Profile/HPI: 62 y.o. male  with past medical history of advanced heart failure with an EF of 10-15%, polysubstance abuse, chronic kidney disease (with 1 kidney) and possible dementia who was admitted on 04/27/2017 with rectal bleeding, altered mental status, hypothermia and hypoglycemia.  PMT was consulted for goals of care.  Length of Stay: 4  Current Medications: Scheduled Meds:  . digoxin  0.0625 mg Oral Daily  . feeding supplement (ENSURE ENLIVE)  237 mL Oral TID BM  . furosemide  40 mg Intravenous Q12H  .  hydrocortisone-pramoxine  1 applicator Rectal BID  . isosorbide mononitrate  30 mg Oral Daily  . lactulose  30 g Oral Once  . LORazepam  0.5 mg Intravenous Once  . morphine CONCENTRATE  5 mg Sublingual Q6H  . mupirocin ointment  1 application Nasal BID  . oxymetazoline  1 spray Each Nare BID  . pantoprazole (PROTONIX) IV  40 mg Intravenous Q12H  . senna  2 tablet Oral QHS  . sertraline  100 mg  Oral QHS  . sodium chloride flush  3 mL Intravenous Q12H    Continuous Infusions: . sodium chloride      PRN Meds: sodium chloride, acetaminophen **OR** acetaminophen, antiseptic oral rinse, glycopyrrolate **OR** glycopyrrolate **OR** glycopyrrolate, haloperidol **OR** haloperidol **OR** haloperidol lactate, LORazepam **OR** LORazepam **OR** LORazepam, menthol-cetylpyridinium, morphine CONCENTRATE **OR** morphine CONCENTRATE, ondansetron **OR** ondansetron (ZOFRAN) IV, polyvinyl alcohol, sodium chloride flush  Physical Exam        Well developed pleasant male, lethargic, calm, appreciative. CV rrr, decreased sounds. resp no distress Abdomen soft  Vital Signs: BP 125/79   Pulse 92   Temp 97.8 F (36.6 C)   Resp 13   Ht 6\' 2"  (1.88 m)   Wt 93.1 kg (205 lb 4 oz)   SpO2 100%   BMI 26.35 kg/m  SpO2: SpO2: 100 % O2 Device: O2 Device: Not Delivered O2 Flow Rate:    Intake/output summary:   Intake/Output Summary (Last 24 hours) at 05/17/2017 0953 Last data filed at 05/17/2017 0630 Gross per 24 hour  Intake 3 ml  Output 1040 ml  Net -1037 ml   LBM: Last BM Date: 05/16/17 Baseline Weight: Weight: 92.4 kg (203 lb 11.3 oz) Most recent weight: Weight: 93.1 kg (205 lb 4 oz)       Palliative Assessment/Data: 20%      Patient Active Problem List   Diagnosis Date Noted  . DNR (do not resuscitate)   . ARF (acute renal failure) (HCC) Jun 09, 2017  . Rectal bleeding 06/09/17  . Edema 06/09/2017  . Altered mental status 2017/06/09  . Prostate abscess 02/18/2017  . AKI (acute kidney  injury) (HCC) 02/18/2017  . Myocardial ischemia   . Pulmonary embolus (HCC)   . Cocaine abuse (HCC)   . H/O noncompliance with medical treatment, presenting hazards to health 12/04/2016  . Chronic systolic CHF (congestive heart failure) (HCC) 12/04/2016  . Anxiety state   . Depression with anxiety 08/05/2016  . DNR (do not resuscitate) discussion   . Palliative care encounter   . Polysubstance abuse (HCC)   . Type 2 diabetes mellitus with complication (HCC)   . Hypertensive heart disease with chronic systolic congestive heart failure (HCC)   . 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 Hyponatremia 02/23/2014  . Alcohol abuse 12/21/2012  . Cocaine dependence (HCC) 12/21/2012  . Substance induced mood disorder (HCC) 12/21/2012  . Coronary artery disease 08/14/2012  . Cardiomyopathy, nonischemic (HCC) 08/14/2012    Palliative Care Plan    Recommendations/Plan:  Full comfort care.    Will d/c interventions not related to comfort and start comfort medications.  Family requests [facility] in [place] - social work order placed.  Chaplain requested as patient has concerns - not about death, but about his life.  Goals of Care and Additional Recommendations:  Limitations on Scope of Treatment: Full Comfort Care  Code Status:  DNR  Prognosis:   < 2 weeks end stage heart failure, renal failure, encephalopathy   Discharge Planning:  Hospice facility  Care plan was discussed with family, patient, amion paged [facility] MD.  Thank you for allowing the Palliative Medicine Team to assist in the care of this patient.  Total time spent:  60 min.     Greater than 50%  of this time was spent counseling and coordinating care related to the above assessment and plan.  Norvel Richards, PA-C Palliative Medicine  Please contact Palliative MedicineTeam phone at [phone] for questions and concerns between 7 am - 7 pm.   Please  see AMION for individual provider pager  numbers.

## 2017-05-17 NOTE — Progress Notes (Signed)
PROGRESS NOTE  [name] WGN:[id number] DOB: February 27, 1956 DOA: 05/03/2017 PCP: [facility]  HPI/Recap of past 24 hours: [name],w CHF(EF 10-15%), CAD, Dm2, Hypertension, hx of prostate abscess s/p drainage, 02/18/2017, ? Dementia, apparently has had altered mental status per sister over the past 1 day. Pt has also had slight increase in lower ext edema over the past 1 month and rectal bleeding. Pt does have history of hemorrhoids, His sister can't recall if he had has a colonoscopy in the past. Pt denies fever, chills, cp, palp, sob, n/v, abd pain, diarrhea, black stool. Pt was brought in for rectal bleeding. In ED, hgb noted to be stable. Was noted to be hypothermic, hypoglycemic, acute encephalopathy with AKI. Pt was started on IV antibiotics to cover for infection, and xerolto held due to GI bleed. Also regarding rectal bleeding. Palliative care was consulted, pt is currently DNR and has been made hospice.  Today, unable to fully understand pt, although looked comfortable, denies any pain. Intermittent confusion noted.  Assessment/Plan: Principal Problem:   ARF (acute renal failure) (HCC) Active Problems:   Cardiomyopathy, nonischemic (HCC)   Rectal bleeding   Edema   Altered mental status   DNR (do not resuscitate)   Comfort measures only status   Acute renal failure (ARF) (HCC)  Acute metabolic encephalopathy Improving overall, with intermittent confusion Multifactorial, hypoglycemia, hypothermia, ??infection Ammonia initially elevated at 50-->decreased to 18 with 1 dose of lactulose  CT head no acute abnormalities Hospice, no further work-up   Hypoglycemia Resolved Tolerating regular diet.   Hypothermia Improved on bear hugger Chest x ray with atelectasis vs consolidation  Sepsis w/u all NEGATIVE: lactic acid 1.1, Pro-calcitonin less than 0.1, PNA legionella antigen, strept pneumonia all negative, blood culture no growth to date.   UA negative  No further management, discontinued all antibiotics  Rectal Bleeding Resolved No further episode per nurse report, hgb stable D/C AC  AKI Improving Likely cardiorenal syndrome  Renal US negative for obstruction Nephrology consulted, started lasix, continue for now, signed off  Systolic Heart failure EF 15 % ECHO 07-2016  Continue IV lasix  History of PE Diagnosed on 12-04-2016; was on xarelto prior to admission He has received 5 month treatment, currently d/c as pt is hospice  Hypothyroidism TSH elevated at 5, Free T 3 low, free T4 normal Started low dose synthroid, currently discontinued as pt is hospice    Code Status: DNR   Family Communication: None at bedside  Disposition Plan: Hospice at [facility] once bed available    Consultants:  Nephrology  Palliative team  Procedures:  NOne   Antimicrobials:  None  DVT prophylaxis: None   Objective: Vitals:   05/17/17 0648 05/17/17 0650 05/17/17 0749 05/17/17 0921  BP:  125/79    Pulse:  92    Resp:  13    Temp:  98 F (36.7 C) (!) 97.4 F (36.3 C) 97.8 F (36.6 C)  TempSrc:  Axillary Oral   SpO2:  100%    Weight: 93.1 kg (205 lb 4 oz)     Height:        Intake/Output Summary (Last 24 hours) at 05/17/2017 1741 Last data filed at 05/17/2017 1016 Gross per 24 hour  Intake 243 ml  Output 1040 ml  Net -797 ml   Filed Weights   05/12/2017 2232 05/16/17 0503 05/17/17 0648  Weight: 92.4 kg (203 lb 11.3 oz) 92.3 kg (203 lb 7.8 oz) 93.1 kg (205 lb 4 oz)  Exam:   General:  Alert, awake, NAD   Cardiovascular: S1, S2 present, no added hrt sound   Respiratory: Bilateral basal crackles   Abdomen: Soft, non-tender, non-distended, BS present  Musculoskeletal: Pitting bilateral edema  Skin: Normal  Psychiatry: Normal mood, intermittent confusion   Data Reviewed: CBC: Recent Labs  Lab Jun 05, 2017 1550 05/14/17 0001 05/14/17 1807 05/15/17 0821  WBC 5.1 4.5  --  5.1  HGB  11.5* 10.9* 10.4* 11.2*  HCT 34.0* 32.7*  32.7* 30.9* 33.3*  MCV 86.3 85.8  --  85.8  PLT 208 206  --  197   Basic Metabolic Panel: Recent Labs  Lab 2017-06-05 1550 05/14/17 0001 05/15/17 0821 05/16/17 0505  NA 141 140 139 141  K 4.5 4.1 4.1 4.8  CL 110 111 109 110  CO2 17* 19* 18* 21*  GLUCOSE 83 75 187* 155*  BUN 46* 48* 49* 55*  CREATININE 2.28* 2.23* 2.13* 1.95*  CALCIUM 8.5* 8.5* 8.4* 8.4*  PHOS  --   --   --  3.6   GFR: Estimated Creatinine Clearance: 45.7 mL/min (A) (by C-G formula based on SCr of 1.95 mg/dL (H)). Liver Function Tests: Recent Labs  Lab 05-Jun-2017 1550 05/14/17 0001 05/16/17 0505  AST 40 34  --   ALT 21 22  --   ALKPHOS 87 80  --   BILITOT 0.8 1.0  --   PROT 7.3 7.0  --   ALBUMIN 3.0* 2.8* 2.5*   No results for input(s): LIPASE, AMYLASE in the last 168 hours. Recent Labs  Lab 2017/06/05 1616 05/14/17 0001  AMMONIA 51* 18   Coagulation Profile: Recent Labs  Lab 05/14/17 0858  INR 1.53   Cardiac Enzymes: No results for input(s): CKTOTAL, CKMB, CKMBINDEX, TROPONINI in the last 168 hours. BNP (last 3 results) No results for input(s): PROBNP in the last 8760 hours. HbA1C: No results for input(s): HGBA1C in the last 72 hours. CBG: Recent Labs  Lab 05/14/17 0835 05/14/17 1221 05/14/17 1810 05/14/17 2119 05/15/17 1049  GLUCAP 67 168* 139* 166* 202*   Lipid Profile: No results for input(s): CHOL, HDL, LDLCALC, TRIG, CHOLHDL, LDLDIRECT in the last 72 hours. Thyroid Function Tests: No results for input(s): TSH, T4TOTAL, FREET4, T3FREE, THYROIDAB in the last 72 hours. Anemia Panel: No results for input(s): VITAMINB12, FOLATE, FERRITIN, TIBC, IRON, RETICCTPCT in the last 72 hours. Urine analysis:    Component Value Date/Time   COLORURINE YELLOW 06/05/2017 1615   APPEARANCEUR HAZY (A) Jun 05, 2017 1615   LABSPEC 1.011 June 05, 2017 1615   PHURINE 5.0 06/05/17 1615   GLUCOSEU NEGATIVE 06/05/2017 1615   HGBUR NEGATIVE Jun 05, 2017 1615    BILIRUBINUR NEGATIVE 06/05/17 1615   KETONESUR NEGATIVE 06-05-17 1615   PROTEINUR NEGATIVE 2017/06/05 1615   UROBILINOGEN 2.0 (H) 02/20/2014 1339   NITRITE NEGATIVE 05-Jun-2017 1615   LEUKOCYTESUR NEGATIVE 06/05/17 1615   Sepsis Labs: @LABRCNTIP (procalcitonin:4,lacticidven:4)  ) Recent Results (from the past 240 hour(s))  Blood Cultures (routine x 2)     Status: None (Preliminary result)   Collection Time: 2017-06-05  4:22 PM  Result Value Ref Range Status   Specimen Description BLOOD BLOOD RIGHT FOREARM  Final   Special Requests AEROBIC BOTTLE ONLY Blood Culture adequate volume  Final   Culture NO GROWTH 4 DAYS  Final   Report Status PENDING  Incomplete  Urine culture     Status: None   Collection Time: 2017/06/05  5:40 PM  Result Value Ref Range Status   Specimen Description URINE, CATHETERIZED  Final   Special Requests NONE  Final   Culture NO GROWTH  Final   Report Status 05/14/2017 FINAL  Final  Blood Cultures (routine x 2)     Status: None (Preliminary result)   Collection Time: May 19, 2017  5:40 PM  Result Value Ref Range Status   Specimen Description BLOOD BLOOD RIGHT FOREARM  Final   Special Requests IN PEDIATRIC BOTTLE Blood Culture adequate volume  Final   Culture NO GROWTH 4 DAYS  Final   Report Status PENDING  Incomplete  MRSA PCR Screening     Status: Abnormal   Collection Time: 19-May-2017 10:39 PM  Result Value Ref Range Status   MRSA by PCR POSITIVE (A) NEGATIVE Final    Comment: RN,ALECIA POTEAT [id number] @0210  THANEY        The GeneXpert MRSA Assay (FDA approved for NASAL specimens only), is one component of a comprehensive MRSA colonization surveillance program. It is not intended to diagnose MRSA infection nor to guide or monitor treatment for MRSA infections.       Studies: No results found.  Scheduled Meds: . digoxin  0.0625 mg Oral Daily  . feeding supplement (ENSURE ENLIVE)  237 mL Oral TID BM  . furosemide  40 mg Intravenous Q12H  .  hydrocortisone-pramoxine  1 applicator Rectal BID  . isosorbide mononitrate  30 mg Oral Daily  . lactulose  30 g Oral Once  . LORazepam  0.5 mg Intravenous Once  . morphine CONCENTRATE  5 mg Sublingual Q6H  . mupirocin ointment  1 application Nasal BID  . oxymetazoline  1 spray Each Nare BID  . pantoprazole (PROTONIX) IV  40 mg Intravenous Q12H  . senna  2 tablet Oral QHS  . sertraline  100 mg Oral QHS  . sodium chloride flush  3 mL Intravenous Q12H    Continuous Infusions: . sodium chloride       LOS: 4 days     Briant Cedar, MD [facility]   If 7PM-7AM, please contact night-coverage [email or web address] Password [facility] 05/17/2017, 5:41 PM

## 2017-05-17 NOTE — Progress Notes (Signed)
Subjective:    UOP not well recorded- weight seems stable- no labs- going to full comfort care- possibly [facility]  Objective Vital signs in last 24 hours: Vitals:   05/17/17 0648 05/17/17 0650 05/17/17 0749 05/17/17 0921  BP:  125/79    Pulse:  92    Resp:  13    Temp:  98 F (36.7 C) (!) 97.4 F (36.3 C) 97.8 F (36.6 C)  TempSrc:  Axillary Oral   SpO2:  100%    Weight: 93.1 kg (205 lb 4 oz)     Height:       Weight change: 0.8 kg (1 lb 12.2 oz)  Intake/Output Summary (Last 24 hours) at 05/17/2017 1028 Last data filed at 05/17/2017 1016 Gross per 24 hour  Intake 243 ml  Output 1040 ml  Net -797 ml   Assessment/Plan: 62 year old black male with multiple medical issues most notably including an ejection fraction of 10-15% and some CKD at baseline.  He presents now with acute on chronic renal failure and now volume overload 1.Renal-patient with a change in creatinine, worsening renal function most likely associated with cardiorenal syndrome  as urinalysis is completely negative.  Thankfully kidney function stable and not worsening.  Patient would not be considered a dialysis candidate secondary to his significant cardiomyopathy 2. Hypertension/volume  -he is volume overloaded at this time.  on Lasix 40 mg IV every 12 hours- difficult to tell if has made impact-  he is also on very low-dose Coreg but otherwise nothing to lower blood pressure to give him ATN- no change for now 3.  Metabolic acidosis-on oral bicarb-to continue - improved 4. Anemia  -not below the threshold of treatment at this time  See transition to full comfort care- renal does not have anything else to offer, will sign off, call with questions     Perian Tedder A    Labs: Basic Metabolic Panel: Recent Labs  Lab 05/14/17 0001 05/15/17 0821 05/16/17 0505  NA 140 139 141  K 4.1 4.1 4.8  CL 111 109 110  CO2 19* 18* 21*  GLUCOSE 75 187* 155*  BUN 48* 49* 55*  CREATININE 2.23* 2.13* 1.95*   CALCIUM 8.5* 8.4* 8.4*  PHOS  --   --  3.6   Liver Function Tests: Recent Labs  Lab 05/19/2017 1550 05/14/17 0001 05/16/17 0505  AST 40 34  --   ALT 21 22  --   ALKPHOS 87 80  --   BILITOT 0.8 1.0  --   PROT 7.3 7.0  --   ALBUMIN 3.0* 2.8* 2.5*   No results for input(s): LIPASE, AMYLASE in the last 168 hours. Recent Labs  Lab 05/03/2017 1616 05/14/17 0001  AMMONIA 51* 18   CBC: Recent Labs  Lab 05/12/2017 1550 05/14/17 0001 05/14/17 1807 05/15/17 0821  WBC 5.1 4.5  --  5.1  HGB 11.5* 10.9* 10.4* 11.2*  HCT 34.0* 32.7*  32.7* 30.9* 33.3*  MCV 86.3 85.8  --  85.8  PLT 208 206  --  197   Cardiac Enzymes: No results for input(s): CKTOTAL, CKMB, CKMBINDEX, TROPONINI in the last 168 hours. CBG: Recent Labs  Lab 05/14/17 0835 05/14/17 1221 05/14/17 1810 05/14/17 2119 05/15/17 1049  GLUCAP 67 168* 139* 166* 202*    Iron Studies: No results for input(s): IRON, TIBC, TRANSFERRIN, FERRITIN in the last 72 hours. Studies/Results: No results found. Medications: Infusions: . sodium chloride      Scheduled Medications: . digoxin  0.0625 mg Oral  Daily  . feeding supplement (ENSURE ENLIVE)  237 mL Oral TID BM  . furosemide  40 mg Intravenous Q12H  . hydrocortisone-pramoxine  1 applicator Rectal BID  . isosorbide mononitrate  30 mg Oral Daily  . lactulose  30 g Oral Once  . LORazepam  0.5 mg Intravenous Once  . morphine CONCENTRATE  5 mg Sublingual Q6H  . mupirocin ointment  1 application Nasal BID  . oxymetazoline  1 spray Each Nare BID  . pantoprazole (PROTONIX) IV  40 mg Intravenous Q12H  . senna  2 tablet Oral QHS  . sertraline  100 mg Oral QHS  . sodium chloride flush  3 mL Intravenous Q12H    have reviewed scheduled and prn medications.  Physical Exam: General: difficult to understand speech  Heart: RRR Lungs: mostly clear Abdomen: soft, non tender Extremities: pitting edema    05/17/2017,10:28 AM  LOS: 4 days

## 2017-05-17 NOTE — Plan of Care (Signed)
  Progressing Clinical Measurements: Ability to maintain clinical measurements within normal limits will improve 05/17/2017 0135 - Progressing by Burtis Junes, RN Note Continuing to monitor temp rectally, patient has been wearing bair hugger for low temp, skin cool to touch. Encouraged patient keep blanket on.  Diagnostic test results will improve 05/17/2017 0135 - Progressing by Burtis Junes, RN Note Vitals remain stable at this time. Cardiovascular complication will be avoided 05/17/2017 0135 - Progressing by Burtis Junes, RN Note Patient continues to have severe generalized edema given lasix as ordered. Elimination: Will not experience complications related to bowel motility 05/17/2017 0135 - Progressing by Burtis Junes, RN Note No signs of rectal bleeding noted no dark tarry stools noted Last bm 1/23

## 2017-05-17 NOTE — Progress Notes (Signed)
Visited with patient and his niece and nephew.  He seemed very anxious and confused about people in blue robes coming.  Shared some scripture with him about peace and had prayer with him.Phebe Colla, Chaplain   05/17/17 1200  Clinical Encounter Type  Visited With Patient and family together  Visit Type Follow-up;Spiritual support;Critical Care  Referral From Nurse  Consult/Referral To Chaplain  Spiritual Encounters  Spiritual Needs [facility] text;Prayer;Emotional

## 2017-05-17 NOTE — Progress Notes (Signed)
[facility] Hospital Liaison:  RN  Received request from [place], [place], for family interest in [facility].  Chart being reviewed and spokw with Lupita Leash, sister, to acknowledge the referral.  Unfortunately, [facility] is not able to offer a room today.  Family and LCSW are aware [facility] liaisons will follow up with LCSW and family toorrow or sooner if room becomes available.  Please do not hesitate to call with questions.    Thank you for this referral.  Adele Barthel, RN, BSN [facility] Liaison [phone]  All hospital liaisons are now on AMION.

## 2017-05-17 NOTE — Care Management Note (Signed)
Case Management Note  Patient Details  Name: ZANDYR MEADER MRN: [id number] Date of Birth: Jun 06, 1955  Subjective/Objective:     Patient with end stage heart failure, encephalopathy, renal failure.  Action/Plan: Plan is to transition to hospice house. CSW managing disposition to facility. CM will continue to follow as needs present.  Expected Discharge Date:                  Expected Discharge Plan:  Hospice Medical Facility  In-House Referral:  Clinical Social Work  Discharge planning Services  CM Consult  Post Acute Care Choice:    Choice offered to:      Status of Service:  Completed, signed off  If discussed at Long Length of Stay Meetings, dates discussed:    Additional Comments:  Epifanio Lesches, RN 05/17/2017, 10:24 AM

## 2017-05-18 LAB — CULTURE, BLOOD (ROUTINE X 2)
CULTURE: NO GROWTH
Culture: NO GROWTH
Special Requests: ADEQUATE
Special Requests: ADEQUATE

## 2017-05-18 NOTE — Progress Notes (Signed)
PROGRESS NOTE  [name]:[id number] DOB: 09/04/1955 DOA: 05/15/2017 [facility]  HPI/Recap of past 24 hours: [name],w CHF(EF 10-15%), CAD, Dm2, Hypertension, hx of prostate abscess s/p drainage, 02/18/2017, ? Dementia, apparently has had altered mental status per sister over the past 1 day. Pt has also had slight increase in lower ext edema over the past 1 month and rectal bleeding. Pt does have history of hemorrhoids, His sister can't recall if he had has a colonoscopy in the past. Pt denies fever, chills, cp, palp, sob, n/v, abd pain, diarrhea, black stool. Pt was brought in for rectal bleeding. In ED, hgb noted to be stable. Was noted to be hypothermic, hypoglycemic, acute encephalopathy with AKI. Pt was started on IV antibiotics to cover for infection, and xerolto held due to GI bleed. Also regarding rectal bleeding. Palliative care was consulted, pt is currently DNR and has been made hospice.  Today, unable to fully understand pt, restless, continues to take all blankets/beddings off from bed. Not in distress, looks comfortable. Pt is currently hospice, awaiting placement at [facility]. Family insists on only [facility]  Assessment/Plan: Principal Problem:   ARF (acute renal failure) (HCC) Active Problems:   Cardiomyopathy, nonischemic (HCC)   Rectal bleeding   Edema   Altered mental status   DNR (do not resuscitate)   Comfort measures only status   Acute renal failure (ARF) (HCC)  Acute metabolic encephalopathy Restless, intermittent confusion Multifactorial, hypoglycemia, hypothermia, ??infection Ammonia initially elevated at 50-->decreased to 18 with 1 dose of lactulose  CT head no acute abnormalities Hospice, no further work-up   Hypoglycemia Resolved Tolerating regular diet.   Hypothermia Chest x ray with atelectasis vs consolidation  Sepsis w/u all NEGATIVE: lactic acid 1.1, Pro-calcitonin less than 0.1, PNA  legionella antigen, strept pneumonia all negative, blood culture no growth to date.  UA negative  No further management, discontinued all antibiotics  Rectal Bleeding Resolved No further episode per nurse report, hgb stable D/C AC  AKI Likely cardiorenal syndrome  Renal US negative for obstruction Nephrology consulted, started lasix, continue for now, signed off No further labs or work up  Systolic Heart failure EF 15 % ECHO 07-2016  Continue IV lasix  History of PE Diagnosed on 12-04-2016; was on xarelto prior to admission He has received 5 month treatment, currently d/c as pt is hospice  Hypothyroidism TSH elevated at 5, Free T 3 low, free T4 normal Started low dose synthroid, currently discontinued as pt is hospice    Code Status: DNR   Family Communication: None at bedside  Disposition Plan: Hospice at [facility] place once bed available    Consultants:  Nephrology  Palliative team  Procedures:  NOne   Antimicrobials:  None  DVT prophylaxis: None   Objective: Vitals:   05/17/17 0749 05/17/17 0921 05/18/17 0622 05/18/17 1351  BP:   136/75 140/88  Pulse:   92 84  Resp:   18 15  Temp: (!) 97.4 F (36.3 C) 97.8 F (36.6 C)  (!) 97.3 F (36.3 C)  TempSrc: Oral   Axillary  SpO2:   97% 100%  Weight:      Height:        Intake/Output Summary (Last 24 hours) at 05/18/2017 1427 Last data filed at 05/17/2017 2000 Gross per 24 hour  Intake 240 ml  Output 400 ml  Net -160 ml   Filed Weights   05/01/2017 2232 05/16/17 0503 05/17/17 0648  Weight: 92.4 kg (203  lb 11.3 oz) 92.3 kg (203 lb 7.8 oz) 93.1 kg (205 lb 4 oz)    Exam:   General:  Alert, awake, NAD   Cardiovascular: S1, S2 present, no added hrt sound   Respiratory: Bilateral basal crackles   Abdomen: Soft, non-tender, non-distended, BS present  Musculoskeletal: Pitting bilateral edema  Skin: Normal  Psychiatry: Normal mood, intermittent confusion   Data Reviewed: CBC: Recent  Labs  Lab 04/29/2017 1550 05/14/17 0001 05/14/17 1807 05/15/17 0821  WBC 5.1 4.5  --  5.1  HGB 11.5* 10.9* 10.4* 11.2*  HCT 34.0* 32.7*  32.7* 30.9* 33.3*  MCV 86.3 85.8  --  85.8  PLT 208 206  --  197   Basic Metabolic Panel: Recent Labs  Lab 05/06/2017 1550 05/14/17 0001 05/15/17 0821 05/16/17 0505  NA 141 140 139 141  K 4.5 4.1 4.1 4.8  CL 110 111 109 110  CO2 17* 19* 18* 21*  GLUCOSE 83 75 187* 155*  BUN 46* 48* 49* 55*  CREATININE 2.28* 2.23* 2.13* 1.95*  CALCIUM 8.5* 8.5* 8.4* 8.4*  PHOS  --   --   --  3.6   GFR: Estimated Creatinine Clearance: 45.7 mL/min (A) (by C-G formula based on SCr of 1.95 mg/dL (H)). Liver Function Tests: Recent Labs  Lab 04/30/2017 1550 05/14/17 0001 05/16/17 0505  AST 40 34  --   ALT 21 22  --   ALKPHOS 87 80  --   BILITOT 0.8 1.0  --   PROT 7.3 7.0  --   ALBUMIN 3.0* 2.8* 2.5*   No results for input(s): LIPASE, AMYLASE in the last 168 hours. Recent Labs  Lab 05/08/2017 1616 05/14/17 0001  AMMONIA 51* 18   Coagulation Profile: Recent Labs  Lab 05/14/17 0858  INR 1.53   Cardiac Enzymes: No results for input(s): CKTOTAL, CKMB, CKMBINDEX, TROPONINI in the last 168 hours. BNP (last 3 results) No results for input(s): PROBNP in the last 8760 hours. HbA1C: No results for input(s): HGBA1C in the last 72 hours. CBG: Recent Labs  Lab 05/14/17 0835 05/14/17 1221 05/14/17 1810 05/14/17 2119 05/15/17 1049  GLUCAP 67 168* 139* 166* 202*   Lipid Profile: No results for input(s): CHOL, HDL, LDLCALC, TRIG, CHOLHDL, LDLDIRECT in the last 72 hours. Thyroid Function Tests: No results for input(s): TSH, T4TOTAL, FREET4, T3FREE, THYROIDAB in the last 72 hours. Anemia Panel: No results for input(s): VITAMINB12, FOLATE, FERRITIN, TIBC, IRON, RETICCTPCT in the last 72 hours. Urine analysis:    Component Value Date/Time   COLORURINE YELLOW 05/24/2017 1615   APPEARANCEUR HAZY (A) 05/22/2017 1615   LABSPEC 1.011 04/30/2017 1615    PHURINE 5.0 05/19/2017 1615   GLUCOSEU NEGATIVE 04/25/2017 1615   HGBUR NEGATIVE 05/19/2017 1615   BILIRUBINUR NEGATIVE 05/04/2017 1615   KETONESUR NEGATIVE 05/12/2017 1615   PROTEINUR NEGATIVE 05/09/2017 1615   UROBILINOGEN 2.0 (H) 02/20/2014 1339   NITRITE NEGATIVE 05/12/2017 1615   LEUKOCYTESUR NEGATIVE 05/05/2017 1615   Sepsis Labs: @LABRCNTIP (procalcitonin:4,lacticidven:4)  ) Recent Results (from the past 240 hour(s))  Blood Cultures (routine x 2)     Status: None   Collection Time: 05/22/2017  4:22 PM  Result Value Ref Range Status   Specimen Description BLOOD BLOOD RIGHT FOREARM  Final   Special Requests AEROBIC BOTTLE ONLY Blood Culture adequate volume  Final   Culture NO GROWTH 5 DAYS  Final   Report Status 05/18/2017 FINAL  Final  Urine culture     Status: None   Collection Time:  05/05/2017  5:40 PM  Result Value Ref Range Status   Specimen Description URINE, CATHETERIZED  Final   Special Requests NONE  Final   Culture NO GROWTH  Final   Report Status 05/14/2017 FINAL  Final  Blood Cultures (routine x 2)     Status: None   Collection Time: 05/14/2017  5:40 PM  Result Value Ref Range Status   Specimen Description BLOOD BLOOD RIGHT FOREARM  Final   Special Requests IN PEDIATRIC BOTTLE Blood Culture adequate volume  Final   Culture NO GROWTH 5 DAYS  Final   Report Status 05/18/2017 FINAL  Final  MRSA PCR Screening     Status: Abnormal   Collection Time: 05/03/2017 10:39 PM  Result Value Ref Range Status   MRSA by PCR POSITIVE (A) NEGATIVE Final    Comment: RN,ALECIA POTEAT [id number] @0210  THANEY        The GeneXpert MRSA Assay (FDA approved for NASAL specimens only), is one component of a comprehensive MRSA colonization surveillance program. It is not intended to diagnose MRSA infection nor to guide or monitor treatment for MRSA infections.       Studies: No results found.  Scheduled Meds: . digoxin  0.0625 mg Oral Daily  . feeding supplement (ENSURE ENLIVE)   237 mL Oral TID BM  . furosemide  40 mg Intravenous Q12H  . hydrocortisone-pramoxine  1 applicator Rectal BID  . isosorbide mononitrate  30 mg Oral Daily  . lactulose  30 g Oral Once  . LORazepam  0.5 mg Intravenous Once  . morphine CONCENTRATE  5 mg Sublingual Q6H  . mupirocin ointment  1 application Nasal BID  . oxymetazoline  1 spray Each Nare BID  . pantoprazole (PROTONIX) IV  40 mg Intravenous Q12H  . senna  2 tablet Oral QHS  . sertraline  100 mg Oral QHS  . sodium chloride flush  3 mL Intravenous Q12H    Continuous Infusions: . sodium chloride       LOS: 5 days     Briant Cedar, MD [facility]   If 7PM-7AM, please contact night-coverage [email or web address] Password [facility] 05/18/2017, 2:27 PM

## 2017-05-18 NOTE — Progress Notes (Signed)
[facility] - RN update  Spoke to [employer] this morning to notify unfortunately that there is no bed availability for [facility] this morning. LCSW will update family on current [facility] beds status.  Will continue to update on BP availability today if anything changes.  Thank you for this referral,  Roda Shutters, RN [facility] Liaison [phone]

## 2017-05-18 NOTE — Progress Notes (Signed)
CSW continuing to follow for residential hospice placement.  CSW met with pt sister, Butch Penny, by pt room to discuss alternative options.  Discussed possible return to [facility] with hospice but sister is strongly against this and feels as if pt did not receive sufficient care there- does not want him to return where she does not believe he will be cared for at end of life  Discussed alternative residential hospice options ([facility], [facility], [facility]) these options are prohibitively far from the patient's family ( each about 30 minutes) and would not allow for them to visit often which is important to them  At this time family only agreeable to [facility]- they continue to have pt on their waitlist  CSW will continue to follow  Jorge Ny, [place] Social Worker [phone]

## 2017-05-19 DIAGNOSIS — Z515 Encounter for palliative care: Secondary | ICD-10-CM

## 2017-05-19 MED ORDER — LORAZEPAM 2 MG/ML IJ SOLN
1.0000 mg | Freq: Three times a day (TID) | INTRAMUSCULAR | Status: DC
  Administered 2017-05-20: 1 mg via INTRAVENOUS
  Filled 2017-05-19: qty 1

## 2017-05-19 MED ORDER — POLYVINYL ALCOHOL 1.4 % OP SOLN
1.0000 [drp] | Freq: Four times a day (QID) | OPHTHALMIC | Status: DC | PRN
  Filled 2017-05-19: qty 15

## 2017-05-19 MED ORDER — MORPHINE SULFATE (PF) 2 MG/ML IV SOLN
2.0000 mg | INTRAVENOUS | Status: DC | PRN
  Administered 2017-05-19: 4 mg via INTRAVENOUS
  Filled 2017-05-19: qty 2

## 2017-05-19 MED ORDER — OXYMETAZOLINE HCL 0.05 % NA SOLN: 1.0000 | Freq: Two times a day (BID) | NASAL | Status: DC | PRN

## 2017-05-19 MED ORDER — HALOPERIDOL 1 MG PO TABS
0.5000 mg | ORAL_TABLET | ORAL | Status: DC | PRN
  Filled 2017-05-19: qty 1

## 2017-05-19 MED ORDER — BIOTENE DRY MOUTH MT LIQD: 15.0000 mL | OROMUCOSAL | Status: DC | PRN

## 2017-05-19 MED ORDER — HALOPERIDOL LACTATE 2 MG/ML PO CONC
0.5000 mg | ORAL | Status: DC | PRN
  Filled 2017-05-19: qty 0.3

## 2017-05-19 MED ORDER — ACETAMINOPHEN 325 MG PO TABS: 650.0000 mg | ORAL_TABLET | Freq: Four times a day (QID) | ORAL | Status: DC | PRN

## 2017-05-19 MED ORDER — HALOPERIDOL LACTATE 5 MG/ML IJ SOLN
0.5000 mg | INTRAMUSCULAR | Status: DC | PRN
  Administered 2017-05-19 – 2017-05-20 (×2): 0.5 mg via INTRAVENOUS
  Filled 2017-05-19 (×2): qty 1

## 2017-05-19 MED ORDER — ACETAMINOPHEN 650 MG RE SUPP: 650.0000 mg | Freq: Four times a day (QID) | RECTAL | Status: DC | PRN

## 2017-05-19 NOTE — Progress Notes (Signed)
PROGRESS NOTE  [name] JGO:[id number] DOB: 1955/10/11 DOA: 05/21/2017 [facility]  HPI/Recap of past 24 hours: [name],w CHF(EF 10-15%), CAD, Dm2, Hypertension, hx of prostate abscess s/p drainage, 02/18/2017, ? Dementia, apparently has had altered mental status per sister over the past 1 day. Pt has also had slight increase in lower ext edema over the past 1 month and rectal bleeding. Pt does have history of hemorrhoids, His sister can't recall if he had has a colonoscopy in the past. Pt denies fever, chills, cp, palp, sob, n/v, abd pain, diarrhea, black stool. Pt was brought in for rectal bleeding. In ED, hgb noted to be stable. Was noted to be hypothermic, hypoglycemic, acute encephalopathy with AKI. Pt was started on IV antibiotics to cover for infection, and xerolto held due to GI bleed. Also regarding rectal bleeding. Palliative care was consulted, pt is currently DNR and has been made hospice.  Today, patient noted to be moaning, unable to answer questions.  Extremities are cold, patient very restless to have any blanket on him.  Patient is currently anuric as per the nurses.  Palliative team rounded on patient's today and concluded that patient is actively dying.  Patient will be transferred to 6N once bed available.  Transition to [facility] will not happen as patient is actively dying.  Assessment/Plan: Principal Problem:   ARF (acute renal failure) (HCC) Active Problems:   Cardiomyopathy, nonischemic (HCC)   Rectal bleeding   Edema   Altered mental status   DNR (do not resuscitate)   Comfort measures only status   Acute renal failure (ARF) (HCC)   Terminal care  Acute metabolic encephalopathy Restless, moaning, cold extremities, actively dying Multifactorial, hypoglycemia, hypothermia, ??infection Ammonia initially elevated at 50-->decreased to 18 with 1 dose of lactulose  CT head no acute abnormalities Hospice, no further  work-up   Hypoglycemia Resolved Tolerating regular diet.   Hypothermia Cold extremities, patient actively dying Chest x ray with atelectasis vs consolidation  Sepsis w/u all NEGATIVE: lactic acid 1.1, Pro-calcitonin less than 0.1, PNA legionella antigen, strept pneumonia all negative, blood culture no growth to date.  UA negative  No further management, discontinued all antibiotics  Rectal Bleeding Resolved No further episode per nurse report, hgb stable D/C AC  AKI Likely cardiorenal syndrome  Renal US negative for obstruction Nephrology consulted, signed off No further labs or work up  Systolic Heart failure EF 15 % ECHO 07-2016   History of PE Diagnosed on 12-04-2016; was on xarelto prior to admission He has received 5 month treatment, currently d/c as pt is hospice  Hypothyroidism TSH elevated at 5, Free T 3 low, free T4 normal Started low dose synthroid, currently discontinued as pt is hospice    Code Status: DNR   Family Communication: None at bedside  Disposition Plan: Transfer to 6N as patient is actively dying   Consultants:  Nephrology  Palliative team  Procedures:  NOne   Antimicrobials:  None  DVT prophylaxis: None   Objective: Vitals:   05/18/17 2128 05/19/17 0613 05/19/17 1000 05/19/17 1323  BP: (!) 158/87 (!) 139/91  (!) 150/110  Pulse: (!) 103 99 66 88  Resp: 17 20    Temp: 97.7 F (36.5 C) (!) 94.1 F (34.5 C)    TempSrc: Axillary Rectal    SpO2: 96% 93%  95%  Weight:      Height:        Intake/Output Summary (Last 24 hours) at 05/19/2017 1839 Last  data filed at 05/19/2017 1333 Gross per 24 hour  Intake 3 ml  Output -  Net 3 ml   Filed Weights   05/25/2017 2232 05/16/17 0503 05/17/17 0648  Weight: 92.4 kg (203 lb 11.3 oz) 92.3 kg (203 lb 7.8 oz) 93.1 kg (205 lb 4 oz)    Exam:   General: Moaning, cold extremities, unable to respond to questions  Cardiovascular: S1, S2 present, no added hrt sound    Respiratory: Bilateral basal crackles   Abdomen: Soft, non-tender, non-distended, BS present  Musculoskeletal: Pitting bilateral edema  Skin: Normal, cold  Psychiatry: Unable to assess   Data Reviewed: CBC: Recent Labs  Lab 05/02/2017 1550 05/14/17 0001 05/14/17 1807 05/15/17 0821  WBC 5.1 4.5  --  5.1  HGB 11.5* 10.9* 10.4* 11.2*  HCT 34.0* 32.7*  32.7* 30.9* 33.3*  MCV 86.3 85.8  --  85.8  PLT 208 206  --  197   Basic Metabolic Panel: Recent Labs  Lab 05/16/2017 1550 05/14/17 0001 05/15/17 0821 05/16/17 0505  NA 141 140 139 141  K 4.5 4.1 4.1 4.8  CL 110 111 109 110  CO2 17* 19* 18* 21*  GLUCOSE 83 75 187* 155*  BUN 46* 48* 49* 55*  CREATININE 2.28* 2.23* 2.13* 1.95*  CALCIUM 8.5* 8.5* 8.4* 8.4*  PHOS  --   --   --  3.6   GFR: Estimated Creatinine Clearance: 45.7 mL/min (A) (by C-G formula based on SCr of 1.95 mg/dL (H)). Liver Function Tests: Recent Labs  Lab 05/05/2017 1550 05/14/17 0001 05/16/17 0505  AST 40 34  --   ALT 21 22  --   ALKPHOS 87 80  --   BILITOT 0.8 1.0  --   PROT 7.3 7.0  --   ALBUMIN 3.0* 2.8* 2.5*   No results for input(s): LIPASE, AMYLASE in the last 168 hours. Recent Labs  Lab 05/25/2017 1616 05/14/17 0001  AMMONIA 51* 18   Coagulation Profile: Recent Labs  Lab 05/14/17 0858  INR 1.53   Cardiac Enzymes: No results for input(s): CKTOTAL, CKMB, CKMBINDEX, TROPONINI in the last 168 hours. BNP (last 3 results) No results for input(s): PROBNP in the last 8760 hours. HbA1C: No results for input(s): HGBA1C in the last 72 hours. CBG: Recent Labs  Lab 05/14/17 0835 05/14/17 1221 05/14/17 1810 05/14/17 2119 05/15/17 1049  GLUCAP 67 168* 139* 166* 202*   Lipid Profile: No results for input(s): CHOL, HDL, LDLCALC, TRIG, CHOLHDL, LDLDIRECT in the last 72 hours. Thyroid Function Tests: No results for input(s): TSH, T4TOTAL, FREET4, T3FREE, THYROIDAB in the last 72 hours. Anemia Panel: No results for input(s):  VITAMINB12, FOLATE, FERRITIN, TIBC, IRON, RETICCTPCT in the last 72 hours. Urine analysis:    Component Value Date/Time   COLORURINE YELLOW 05/22/2017 1615   APPEARANCEUR HAZY (A) 05/15/2017 1615   LABSPEC 1.011 05/17/2017 1615   PHURINE 5.0 05/09/2017 1615   GLUCOSEU NEGATIVE 05/25/2017 1615   HGBUR NEGATIVE 05/03/2017 1615   BILIRUBINUR NEGATIVE 05/10/2017 1615   KETONESUR NEGATIVE 05/06/2017 1615   PROTEINUR NEGATIVE 05/10/2017 1615   UROBILINOGEN 2.0 (H) 02/20/2014 1339   NITRITE NEGATIVE 04/28/2017 1615   LEUKOCYTESUR NEGATIVE 05/22/2017 1615   Sepsis Labs: @LABRCNTIP (procalcitonin:4,lacticidven:4)  ) Recent Results (from the past 240 hour(s))  Blood Cultures (routine x 2)     Status: None   Collection Time: 04/30/2017  4:22 PM  Result Value Ref Range Status   Specimen Description BLOOD BLOOD RIGHT FOREARM  Final   Special  Requests AEROBIC BOTTLE ONLY Blood Culture adequate volume  Final   Culture NO GROWTH 5 DAYS  Final   Report Status 05/18/2017 FINAL  Final  Urine culture     Status: None   Collection Time: 05/02/2017  5:40 PM  Result Value Ref Range Status   Specimen Description URINE, CATHETERIZED  Final   Special Requests NONE  Final   Culture NO GROWTH  Final   Report Status 05/14/2017 FINAL  Final  Blood Cultures (routine x 2)     Status: None   Collection Time: 05/09/2017  5:40 PM  Result Value Ref Range Status   Specimen Description BLOOD BLOOD RIGHT FOREARM  Final   Special Requests IN PEDIATRIC BOTTLE Blood Culture adequate volume  Final   Culture NO GROWTH 5 DAYS  Final   Report Status 05/18/2017 FINAL  Final  MRSA PCR Screening     Status: Abnormal   Collection Time: 05/09/2017 10:39 PM  Result Value Ref Range Status   MRSA by PCR POSITIVE (A) NEGATIVE Final    Comment: RN,ALECIA POTEAT [id number] @0210  THANEY        The GeneXpert MRSA Assay (FDA approved for NASAL specimens only), is one component of a comprehensive MRSA colonization surveillance program.  It is not intended to diagnose MRSA infection nor to guide or monitor treatment for MRSA infections.       Studies: No results found.  Scheduled Meds: . furosemide  40 mg Intravenous Q12H  . lactulose  30 g Oral Once  . LORazepam  1 mg Intravenous Q8H  . morphine CONCENTRATE  5 mg Sublingual Q6H  . mupirocin ointment  1 application Nasal BID  . sodium chloride flush  3 mL Intravenous Q12H    Continuous Infusions: . sodium chloride       LOS: 6 days     Briant Cedar, MD [facility]   If 7PM-7AM, please contact night-coverage [email or web address] Password [facility] 05/19/2017, 6:39 PM

## 2017-05-19 NOTE — Clinical Social Work Note (Signed)
Clinical Social Worker continuing to follow patient and family for support and discharge planning needs.  Per Palliative Medicine, patient is actively dying and has plans to transfer to [facility]N.  CSW remains available for support to patient family and staff as needed.  Macario Golds, [place] [phone]

## 2017-05-19 NOTE — Progress Notes (Signed)
Daily Progress Note   Patient Name: [name]       Date: 05/19/2017 DOB: [birth year]  Age: 62 y.o. MRN#: [id number] Attending Physician: Briant Cedar, MD [facility]: [facility] Admit Date: 2017/06/03  Reason for Consultation/Follow-up: Establishing goals of care  Subjective: Patient no longer speaking in words, moans.  All 4 extremities are cold.  Per RN patient has been agitated today.  Will not tolerate a blanket.   Urine output has been zero since last night.   Assessment: Patient is actively dying.   Patient Profile/HPI:  62 y.o. male  with past medical history of advanced heart failure with an EF of 10-15%, polysubstance abuse, chronic kidney disease (with 1 kidney) and possible dementia who was admitted on June 03, 2017 with rectal bleeding, altered mental status, hypothermia and hypoglycemia.  PMT was consulted for goals of care.   Length of Stay: 6  Current Medications: Scheduled Meds:  . furosemide  40 mg Intravenous Q12H  . lactulose  30 g Oral Once  . LORazepam  1 mg Intravenous Q8H  . morphine CONCENTRATE  5 mg Sublingual Q6H  . mupirocin ointment  1 application Nasal BID  . sodium chloride flush  3 mL Intravenous Q12H    Continuous Infusions: . sodium chloride      PRN Meds: sodium chloride, acetaminophen **OR** acetaminophen, antiseptic oral rinse, glycopyrrolate **OR** glycopyrrolate **OR** glycopyrrolate, haloperidol **OR** haloperidol **OR** haloperidol lactate, haloperidol **OR** haloperidol **OR** haloperidol lactate, LORazepam **OR** LORazepam **OR** LORazepam, menthol-cetylpyridinium, morphine CONCENTRATE **OR** morphine CONCENTRATE, ondansetron **OR** ondansetron (ZOFRAN) IV, oxymetazoline, polyvinyl alcohol, sodium chloride  flush  Physical Exam        Well developed male, moaning, eyes rolled back in his head CV rrr resp no distress on N/C Abdomen firm but not tight, non-tender Extremities are cold.  Vital Signs: BP (!) 150/110   Pulse 88   Temp (!) 94.1 F (34.5 C) (Rectal)   Resp 20   Ht 6\' 2"  (1.88 m)   Wt 93.1 kg (205 lb 4 oz)   SpO2 95%   BMI 26.35 kg/m  SpO2: SpO2: 95 % O2 Device: O2 Device: Not Delivered O2 Flow Rate:    Intake/output summary:   Intake/Output Summary (Last 24 hours) at 05/19/2017 1345 Last data filed at 05/19/2017 1333 Gross per 24 hour  Intake 3 ml  Output 475 ml  Net -472 ml   LBM: Last BM Date: 05/17/17 Baseline Weight: Weight: 92.4 kg (203 lb 11.3 oz) Most recent weight: Weight: 93.1 kg (205 lb 4 oz)       Palliative Assessment/Data: 10%    Flowsheet Rows     Most Recent Value  Intake Tab  Referral Department  Hospitalist  Unit at Time of Referral  Med/Surg Unit  Palliative Care Primary Diagnosis  Cardiac  Date Notified  05/14/17  Palliative Care Type  New Palliative care  Reason for referral  Clarify Goals of Care  Date of Admission  05/21/2017  Date first seen by Palliative Care  05/15/17  # of days Palliative referral response time  1 Day(s)  # of days IP prior to Palliative referral  1  Clinical Assessment  Psychosocial & Spiritual Assessment  Palliative Care Outcomes      Patient Active Problem List   Diagnosis Date Noted  . Acute renal failure (ARF) (HCC) 05/17/2017  . Comfort measures only status   . DNR (do not resuscitate)   . ARF (acute renal failure) (HCC) 04/30/2017  . Rectal bleeding 05/24/2017  . Edema 05/18/2017  . Altered mental status 05/20/2017  . Prostate abscess 02/18/2017  . AKI (acute kidney injury) (HCC) 02/18/2017  . Myocardial ischemia   . Pulmonary embolus (HCC)   . Cocaine abuse (HCC)   . H/O noncompliance with medical treatment, presenting hazards to health 12/04/2016  . Chronic systolic CHF (congestive heart  failure) (HCC) 12/04/2016  . Anxiety state   . Depression with anxiety 08/05/2016  . DNR (do not resuscitate) discussion   . Palliative care encounter   . Polysubstance abuse (HCC)   . Type 2 diabetes mellitus with complication (HCC)   . Hypertensive heart disease with chronic systolic congestive heart failure (HCC)   . 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 Hyponatremia 02/23/2014  . Alcohol abuse 12/21/2012  . Cocaine dependence (HCC) 12/21/2012  . Substance induced mood disorder (HCC) 12/21/2012  . Coronary artery disease 08/14/2012  . Cardiomyopathy, nonischemic (HCC) 08/14/2012    Palliative Care Plan    Recommendations/Plan:  Will cancel any plans for transport outside of the hospital.  He is actively dying  Will schedule ativan given agitation.   He also has PRN ativan and haldol.  Will add IV morphine (in addition to sl) as it is faster acting - we want it available if he becomes uncomfortable.  Have left message for sister Lupita Leash and attempted to call his mother.  I spoke with sister Britta Mccreedy at bedside.  Goals of Care and Additional Recommendations:  Limitations on Scope of Treatment: Full Comfort Care  Code Status:  DNR  Prognosis:   Hours - Days likely less than 24 hours.   Discharge Planning:  Anticipated Hospital Death  Care plan was discussed with Bedside RN, sister Britta Mccreedy at bedside and attending [facility] physician.  Thank you for allowing the Palliative Medicine Team to assist in the care of this patient.  Total time spent:  35 min     Greater than 50%  of this time was spent counseling and coordinating care related to the above assessment and plan.  Norvel Richards, PA-C Palliative Medicine  Please contact Palliative MedicineTeam phone at [phone] for questions and concerns between 7 am - 7 pm.   Please see AMION for individual provider pager numbers.

## 2017-05-20 DIAGNOSIS — Z515 Encounter for palliative care: Secondary | ICD-10-CM

## 2017-05-20 MED ORDER — GLYCOPYRROLATE 0.2 MG/ML IJ SOLN
0.2000 mg | Freq: Four times a day (QID) | INTRAMUSCULAR | Status: DC
  Administered 2017-05-20 – 2017-05-21 (×4): 0.2 mg via INTRAVENOUS
  Filled 2017-05-20 (×4): qty 1

## 2017-05-20 MED ORDER — GLYCOPYRROLATE 0.2 MG/ML IJ SOLN: 0.2000 mg | Freq: Three times a day (TID) | INTRAMUSCULAR | Status: DC

## 2017-05-20 MED ORDER — MORPHINE SULFATE (PF) 4 MG/ML IV SOLN
2.0000 mg | Freq: Four times a day (QID) | INTRAVENOUS | Status: DC
  Administered 2017-05-20 – 2017-05-21 (×4): 2 mg via INTRAVENOUS
  Filled 2017-05-20 (×4): qty 1

## 2017-05-20 MED ORDER — MORPHINE SULFATE (PF) 4 MG/ML IV SOLN
2.0000 mg | INTRAVENOUS | Status: DC | PRN
  Administered 2017-05-20 – 2017-05-21 (×3): 4 mg via INTRAVENOUS
  Filled 2017-05-20 (×3): qty 1

## 2017-05-20 MED ORDER — LORAZEPAM 2 MG/ML IJ SOLN
1.0000 mg | Freq: Four times a day (QID) | INTRAMUSCULAR | Status: DC
  Administered 2017-05-20 – 2017-05-21 (×4): 1 mg via INTRAVENOUS
  Filled 2017-05-20 (×4): qty 1

## 2017-05-20 NOTE — Progress Notes (Signed)
Daily Progress Note   Patient Name: [name]       Date: 05/20/2017 DOB: 1955/09/29  Age: 62 y.o. MRN#: [id number] Attending Physician: Briant Cedar, MD Primary Care Physician: [facility] Admit Date: Jun 10, 2017  Reason for Consultation/Follow-up: Non pain symptom management, Pain control, Psychosocial/spiritual support and Terminal Care  Subjective: Patient seen chart reviewed; staffed with bedside RN.  Patient appears restless, with upper airway secretions.  Per chart review, patient has received approximately 20 mg of oral morphine over the past 24 hours and is on scheduled Ativan every 6 hours for agitation  Length of Stay: 7  Current Medications: Scheduled Meds:  . glycopyrrolate  0.2 mg Intravenous Q6H  . LORazepam  1 mg Intravenous Q6H  .  morphine injection  2 mg Intravenous Q6H  . morphine CONCENTRATE  5 mg Sublingual Q6H  . sodium chloride flush  3 mL Intravenous Q12H    Continuous Infusions: . sodium chloride      PRN Meds: sodium chloride, acetaminophen **OR** acetaminophen, antiseptic oral rinse, glycopyrrolate **OR** glycopyrrolate **OR** glycopyrrolate, haloperidol **OR** haloperidol **OR** haloperidol lactate, LORazepam **OR** LORazepam **OR** LORazepam, morphine injection, morphine CONCENTRATE **OR** morphine CONCENTRATE, ondansetron **OR** ondansetron (ZOFRAN) IV, oxymetazoline, polyvinyl alcohol, sodium chloride flush  Physical Exam  Constitutional: He appears well-developed and well-nourished.  Acutely ill-appearing older man; minimally responsive, moaning  Pulmonary/Chest:  Frequent 10-15-second apnea observed Upper airway congestion noted  Neurological:  Moans with touch.  Patient is nonverbal  Psychiatric:  Unable to test  Nursing  note and vitals reviewed.           Vital Signs: BP (!) 153/87 (BP Location: Left Arm)   Pulse 77   Temp (!) 94.1 F (34.5 C) (Rectal)   Resp 15   Ht 6\' 2"  (1.88 m)   Wt 93.1 kg (205 lb 4 oz)   SpO2 90%   BMI 26.35 kg/m  SpO2: SpO2: 90 % O2 Device: O2 Device: Not Delivered O2 Flow Rate:    Intake/output summary:   Intake/Output Summary (Last 24 hours) at 05/20/2017 1002 Last data filed at 05/20/2017 0730 Gross per 24 hour  Intake 3 ml  Output -  Net 3 ml   LBM: Last BM Date: 05/17/17 Baseline Weight: Weight: 92.4 kg (203 lb 11.3 oz) Most recent weight: Weight: 93.1 kg (  205 lb 4 oz)       Palliative Assessment/Data:    Flowsheet Rows     Most Recent Value  Intake Tab  Referral Department  Hospitalist  Unit at Time of Referral  Med/Surg Unit  Palliative Care Primary Diagnosis  Cardiac  Date Notified  05/14/17  Palliative Care Type  New Palliative care  Reason for referral  Clarify Goals of Care  Date of Admission  05-19-2017  Date first seen by Palliative Care  05/15/17  # of days Palliative referral response time  1 Day(s)  # of days IP prior to Palliative referral  1  Clinical Assessment  Psychosocial & Spiritual Assessment  Palliative Care Outcomes      Patient Active Problem List   Diagnosis Date Noted  . Terminal care   . Acute renal failure (ARF) (HCC) 05/17/2017  . Comfort measures only status   . DNR (do not resuscitate)   . ARF (acute renal failure) (HCC) 05/19/17  . Rectal bleeding 2017/05/19  . Edema 19-May-2017  . Altered mental status 2017/05/19  . Prostate abscess 02/18/2017  . AKI (acute kidney injury) (HCC) 02/18/2017  . Myocardial ischemia   . Pulmonary embolus (HCC)   . Cocaine abuse (HCC)   . H/O noncompliance with medical treatment, presenting hazards to health 12/04/2016  . Chronic systolic CHF (congestive heart failure) (HCC) 12/04/2016  . Anxiety state   . Depression with anxiety 08/05/2016  . DNR (do not resuscitate)  discussion   . Palliative care encounter   . Polysubstance abuse (HCC)   . Type 2 diabetes mellitus with complication (HCC)   . Hypertensive heart disease with chronic systolic congestive heart failure (HCC)   . 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 Hyponatremia 02/23/2014  . Alcohol abuse 12/21/2012  . Cocaine dependence (HCC) 12/21/2012  . Substance induced mood disorder (HCC) 12/21/2012  . Coronary artery disease 08/14/2012  . Cardiomyopathy, nonischemic (HCC) 08/14/2012    Palliative Care Assessment & Plan   Patient Profile:  62 y.o.malewith past medical history of advanced heart failure with an EF of 10-15%, polysubstance abuse, chronic kidney disease (with 1 kidney) and possible dementiawho was admitted on 01-25-2019with rectal bleeding, altered mental status, hypothermia and hypoglycemia.   PMT was consulted for goals of care.     Assessment: Patient is actively dying.  No mottling noted but brief (10-15 seconds), frequent apnea noted.  He is restless  Recommendations/Plan:  Agitation: Needs improvement.  Will schedule Ativan 1 mg every 6 hours as well as as needed option available for breakthrough symptoms  Pain: Needs improvement.  Schedule morphine IV 2 mg every 6 hours and continue with as needed dosing for breakthrough pain.  Unmanaged pain may be part of his motor restlessness  Dyspnea: Needs improvement.  Starting scheduled Robinul for upper airway secretions and will schedule morphine for shortness of breath  Secretions: New.  Will start scheduled Robinul 0.2 mg every 6 hours around-the-clock as well as every 4 hours as needed  Goals of Care and Additional Recommendations:  Limitations on Scope of Treatment: Full Comfort Care  Code Status:    Code Status Orders  (From admission, onward)        Start     Ordered   05/19/17 1341  Do not attempt resuscitation (DNR)  Continuous    Question Answer Comment  In the event of cardiac or respiratory ARREST  Do not call a "code blue"   In the event of cardiac or respiratory ARREST Do  not perform Intubation, CPR, defibrillation or ACLS   In the event of cardiac or respiratory ARREST Use medication by any route, position, wound care, and other measures to relive pain and suffering. May use oxygen, suction and manual treatment of airway obstruction as needed for comfort.      05/19/17 1340    Code Status History    Date Active Date Inactive Code Status Order ID Comments User Context   05/17/2017 09:51 05/19/2017 13:40 DNR [id number]  Trevor Iha, PA-C Inpatient   05/15/2017 13:46 05/17/2017 09:51 DNR [id number]  Trevor Iha, PA-C Inpatient   05/18/17 22:31 05/15/2017 13:46 Full Code [id number]  Pearson Grippe, MD Inpatient   02/18/2017 06:47 02/28/2017 19:44 Full Code [id number]  Pearson Grippe, MD ED   12/04/2016 14:15 12/07/2016 19:54 Full Code [id number]  Valentino Nose, MD Inpatient   08/05/2016 21:58 08/13/2016 15:44 Full Code [id number]  Briscoe Deutscher, MD ED   04/11/2016 01:03 04/14/2016 16:07 Full Code [id number]  Hillary Bow, DO ED   09/14/2015 15:18 09/16/2015 20:48 Partial Code [id number]  Canary Brim, NP Inpatient   09/07/2015 11:18 09/14/2015 15:18 Full Code [id number]  Jeralyn Bennett, MD Inpatient   09/07/2015 07:55 09/07/2015 11:18 Full Code [id number]  Leta Baptist, MD ED   03/10/2015 04:33 03/12/2015 19:41 Full Code [id number]  Hillary Bow, DO ED   02/20/2014 18:13 02/27/2014 16:52 Full Code [id number]  Catarina Hartshorn, MD Inpatient   08/11/2012 12:29 08/14/2012 21:19 Full Code [id number]  Judie Bonus, MD Inpatient       Prognosis:   Hours - Days  Discharge Planning:  Anticipated Hospital Death    Thank you for allowing the Palliative Medicine Team to assist in the care of this patient.   Time In: 0800 Time Out: 0825 Total Time 25 min Prolonged Time Billed  no       Greater than 50%  of this time was spent counseling and coordinating care related to the above  assessment and plan.  Irean Hong, NP  Please contact Palliative Medicine Team phone at [phone] for questions and concerns.

## 2017-05-20 NOTE — Progress Notes (Signed)
PROGRESS NOTE  SUVAN STCYR ION:[id number] DOB: 1955-05-11 DOA: 06-08-17 PCP: [facility]  HPI/Recap of past 24 hours: [name],w CHF(EF 10-15%), CAD, Dm2, Hypertension, hx of prostate abscess s/p drainage, 02/18/2017, ? Dementia, apparently has had altered mental status per sister over the past 1 day. Pt has also had slight increase in lower ext edema over the past 1 month and rectal bleeding. Pt does have history of hemorrhoids, His sister can't recall if he had has a colonoscopy in the past. Pt denies fever, chills, cp, palp, sob, n/v, abd pain, diarrhea, black stool. Pt was brought in for rectal bleeding. In ED, hgb noted to be stable. Was noted to be hypothermic, hypoglycemic, acute encephalopathy with AKI. Pt was started on IV antibiotics to cover for infection, and xerolto held due to GI bleed. Also regarding rectal bleeding. Palliative care was consulted, pt is currently DNR and has been made hospice.  Today, patient still agitated, restless.  Actively dying.  Not able to communicate  Assessment/Plan: Principal Problem:   ARF (acute renal failure) (HCC) Active Problems:   Cardiomyopathy, nonischemic (HCC)   Rectal bleeding   Edema   Altered mental status   DNR (do not resuscitate)   Comfort measures only status   Acute renal failure (ARF) (HCC)   Terminal care   Palliative care by specialist  Acute metabolic encephalopathy Restless, moaning, cold extremities, actively dying Multifactorial, hypoglycemia, hypothermia, ??infection Ammonia initially elevated at 50-->decreased to 18 with 1 dose of lactulose  CT head no acute abnormalities Hospice, no further work-up   Hypoglycemia Resolved Tolerating regular diet.   Hypothermia Cold extremities, patient actively dying Chest x ray with atelectasis vs consolidation  Sepsis w/u all NEGATIVE: lactic acid 1.1, Pro-calcitonin less than 0.1, PNA legionella antigen, strept pneumonia all  negative, blood culture no growth to date.  UA negative  No further management, discontinued all antibiotics  Rectal Bleeding Resolved No further episode per nurse report, hgb stable D/C AC  AKI Likely cardiorenal syndrome  Renal US negative for obstruction Nephrology consulted, signed off No further labs or work up  Systolic Heart failure EF 15 % ECHO 07-2016   History of PE Diagnosed on 12-04-2016; was on xarelto prior to admission He has received 5 month treatment, currently d/c as pt is hospice  Hypothyroidism TSH elevated at 5, Free T 3 low, free T4 normal Started low dose synthroid, currently discontinued as pt is hospice    Code Status: DNR   Family Communication: None at bedside  Disposition Plan: Transfer to 6N as patient is actively dying   Consultants:  Nephrology  Palliative team  Procedures:  NOne   Antimicrobials:  None  DVT prophylaxis: None   Objective: Vitals:   05/19/17 2222 05/20/17 0637 05/20/17 0800 05/20/17 1200  BP: 135/82 (!) 153/87    Pulse: 77 77    Resp: 15  16 14   Temp:      TempSrc:      SpO2: 96% 90%    Weight:      Height:        Intake/Output Summary (Last 24 hours) at 05/20/2017 1841 Last data filed at 05/20/2017 1320 Gross per 24 hour  Intake 3 ml  Output -  Net 3 ml   Filed Weights   06-08-17 2232 05/16/17 0503 05/17/17 0648  Weight: 92.4 kg (203 lb 11.3 oz) 92.3 kg (203 lb 7.8 oz) 93.1 kg (205 lb 4 oz)    Exam:   General: Moaning,  cold extremities, unable to respond to questions  Cardiovascular: S1, S2 present, no added hrt sound   Respiratory: Bilateral basal crackles   Abdomen: Soft, non-tender, non-distended, BS present  Musculoskeletal: Pitting bilateral edema  Skin: Normal, cold  Psychiatry: Unable to assess   Data Reviewed: CBC: Recent Labs  Lab 05/14/17 0001 05/14/17 1807 05/15/17 0821  WBC 4.5  --  5.1  HGB 10.9* 10.4* 11.2*  HCT 32.7*  32.7* 30.9* 33.3*  MCV 85.8  --   85.8  PLT 206  --  197   Basic Metabolic Panel: Recent Labs  Lab 05/14/17 0001 05/15/17 0821 05/16/17 0505  NA 140 139 141  K 4.1 4.1 4.8  CL 111 109 110  CO2 19* 18* 21*  GLUCOSE 75 187* 155*  BUN 48* 49* 55*  CREATININE 2.23* 2.13* 1.95*  CALCIUM 8.5* 8.4* 8.4*  PHOS  --   --  3.6   GFR: Estimated Creatinine Clearance: 45.7 mL/min (A) (by C-G formula based on SCr of 1.95 mg/dL (H)). Liver Function Tests: Recent Labs  Lab 05/14/17 0001 05/16/17 0505  AST 34  --   ALT 22  --   ALKPHOS 80  --   BILITOT 1.0  --   PROT 7.0  --   ALBUMIN 2.8* 2.5*   No results for input(s): LIPASE, AMYLASE in the last 168 hours. Recent Labs  Lab 05/14/17 0001  AMMONIA 18   Coagulation Profile: Recent Labs  Lab 05/14/17 0858  INR 1.53   Cardiac Enzymes: No results for input(s): CKTOTAL, CKMB, CKMBINDEX, TROPONINI in the last 168 hours. BNP (last 3 results) No results for input(s): PROBNP in the last 8760 hours. HbA1C: No results for input(s): HGBA1C in the last 72 hours. CBG: Recent Labs  Lab 05/14/17 0835 05/14/17 1221 05/14/17 1810 05/14/17 2119 05/15/17 1049  GLUCAP 67 168* 139* 166* 202*   Lipid Profile: No results for input(s): CHOL, HDL, LDLCALC, TRIG, CHOLHDL, LDLDIRECT in the last 72 hours. Thyroid Function Tests: No results for input(s): TSH, T4TOTAL, FREET4, T3FREE, THYROIDAB in the last 72 hours. Anemia Panel: No results for input(s): VITAMINB12, FOLATE, FERRITIN, TIBC, IRON, RETICCTPCT in the last 72 hours. Urine analysis:    Component Value Date/Time   COLORURINE YELLOW June 03, 2017 1615   APPEARANCEUR HAZY (A) Jun 03, 2017 1615   LABSPEC 1.011 06/03/2017 1615   PHURINE 5.0 06-03-2017 1615   GLUCOSEU NEGATIVE 03-Jun-2017 1615   HGBUR NEGATIVE Jun 03, 2017 1615   BILIRUBINUR NEGATIVE 06/03/2017 1615   KETONESUR NEGATIVE 06/03/2017 1615   PROTEINUR NEGATIVE June 03, 2017 1615   UROBILINOGEN 2.0 (H) 02/20/2014 1339   NITRITE NEGATIVE 06/03/2017 1615    LEUKOCYTESUR NEGATIVE 2017-06-03 1615   Sepsis Labs: @LABRCNTIP (procalcitonin:4,lacticidven:4)  ) Recent Results (from the past 240 hour(s))  Blood Cultures (routine x 2)     Status: None   Collection Time: 06/03/17  4:22 PM  Result Value Ref Range Status   Specimen Description BLOOD BLOOD RIGHT FOREARM  Final   Special Requests AEROBIC BOTTLE ONLY Blood Culture adequate volume  Final   Culture NO GROWTH 5 DAYS  Final   Report Status 05/18/2017 FINAL  Final  Urine culture     Status: None   Collection Time: 2017-06-03  5:40 PM  Result Value Ref Range Status   Specimen Description URINE, CATHETERIZED  Final   Special Requests NONE  Final   Culture NO GROWTH  Final   Report Status 05/14/2017 FINAL  Final  Blood Cultures (routine x 2)     Status:  None   Collection Time: 05-14-2017  5:40 PM  Result Value Ref Range Status   Specimen Description BLOOD BLOOD RIGHT FOREARM  Final   Special Requests IN PEDIATRIC BOTTLE Blood Culture adequate volume  Final   Culture NO GROWTH 5 DAYS  Final   Report Status 05/18/2017 FINAL  Final  MRSA PCR Screening     Status: Abnormal   Collection Time: 2017/05/14 10:39 PM  Result Value Ref Range Status   MRSA by PCR POSITIVE (A) NEGATIVE Final    Comment: RN,ALECIA POTEAT [id number] @0210  THANEY        The GeneXpert MRSA Assay (FDA approved for NASAL specimens only), is one component of a comprehensive MRSA colonization surveillance program. It is not intended to diagnose MRSA infection nor to guide or monitor treatment for MRSA infections.       Studies: No results found.  Scheduled Meds: . glycopyrrolate  0.2 mg Intravenous Q6H  . LORazepam  1 mg Intravenous Q6H  .  morphine injection  2 mg Intravenous Q6H  . morphine CONCENTRATE  5 mg Sublingual Q6H  . sodium chloride flush  3 mL Intravenous Q12H    Continuous Infusions: . sodium chloride       LOS: 7 days     Briant Cedar, MD [facility]   If 7PM-7AM, please  contact night-coverage [email or web address] Password [facility] 05/20/2017, 6:41 PM

## 2017-05-21 MED ORDER — SODIUM CHLORIDE 0.9 % IV SOLN
1.0000 mg/h | INTRAVENOUS | Status: DC
  Administered 2017-05-21: 1 mg/h via INTRAVENOUS
  Filled 2017-05-21: qty 10

## 2017-05-21 MED ORDER — MORPHINE SULFATE (PF) 4 MG/ML IV SOLN: 2.0000 mg | INTRAVENOUS | Status: DC

## 2017-05-21 MED ORDER — GLYCOPYRROLATE 0.2 MG/ML IJ SOLN
0.2000 mg | INTRAMUSCULAR | Status: DC
  Administered 2017-05-21 – 2017-05-22 (×9): 0.2 mg via INTRAVENOUS
  Filled 2017-05-21 (×9): qty 1

## 2017-05-21 MED ORDER — LORAZEPAM 2 MG/ML IJ SOLN
2.0000 mg | INTRAMUSCULAR | Status: DC
  Administered 2017-05-21 – 2017-05-22 (×9): 2 mg via INTRAVENOUS
  Filled 2017-05-21 (×9): qty 1

## 2017-05-21 MED ORDER — MORPHINE BOLUS VIA INFUSION
4.0000 mg | INTRAVENOUS | Status: DC | PRN
  Filled 2017-05-21: qty 4

## 2017-05-21 NOTE — Progress Notes (Signed)
PROGRESS NOTE  [name] JXB:[id number] DOB: March 29, 1956 DOA: 05/20/2017 PCP: [facility]  HPI/Recap of past 24 hours: [name],w CHF(EF 10-15%), CAD, Dm2, Hypertension, hx of prostate abscess s/p drainage, 02/18/2017, ? Dementia, apparently has had altered mental status per sister over the past 1 day. Pt has also had slight increase in lower ext edema over the past 1 month and rectal bleeding. Pt does have history of hemorrhoids, His sister can't recall if he had has a colonoscopy in the past. Pt denies fever, chills, cp, palp, sob, n/v, abd pain, diarrhea, black stool. Pt was brought in for rectal bleeding. In ED, hgb noted to be stable. Was noted to be hypothermic, hypoglycemic, acute encephalopathy with AKI. Pt was started on IV antibiotics to cover for infection, and xerolto held due to GI bleed. Also regarding rectal bleeding. Palliative care was consulted, pt is currently DNR and has been made hospice.  Today, patient sleeping/actively dying, not in acute distress, nonverbal, unable to eat or drink.  Assessment/Plan: Principal Problem:   ARF (acute renal failure) (HCC) Active Problems:   Cardiomyopathy, nonischemic (HCC)   Rectal bleeding   Edema   Altered mental status   DNR (do not resuscitate)   Comfort measures only status   Acute renal failure (ARF) (HCC)   Terminal care   Palliative care by specialist  Acute metabolic encephalopathy Restless, moaning, cold extremities, actively dying Multifactorial, hypoglycemia, hypothermia, ??infection CT head no acute abnormalities Hospice, no further work-up   Hypoglycemia Resolved  Hypothermia Cold extremities, patient actively dying Chest x ray with atelectasis vs consolidation  Sepsis w/u all NEGATIVE: lactic acid 1.1, Pro-calcitonin less than 0.1, PNA legionella antigen, strept pneumonia all negative, blood culture no growth to date.  UA negative  No further management,  discontinued all antibiotics  Rectal Bleeding Resolved No further episode per nurse report, hgb stable D/C AC  AKI Likely cardiorenal syndrome  Renal US negative for obstruction Nephrology consulted, signed off No further labs or work up  Systolic Heart failure EF 15 % ECHO 07-2016   History of PE Diagnosed on 12-04-2016; was on xarelto prior to admission He has received 5 month treatment, currently d/c as pt is hospice  Hypothyroidism TSH elevated at 5, Free T 3 low, free T4 normal Started low dose synthroid, currently discontinued as pt is hospice    Code Status: DNR   Family Communication: None at bedside  Disposition Plan: Patient is actively dying   Consultants:  Nephrology  Palliative team  Procedures:  None   Antimicrobials:  None  DVT prophylaxis: None   Objective: Vitals:   05/21/17 0430 05/21/17 0809 05/21/17 1036 05/21/17 1415  BP: (!) 121/91     Pulse: 93     Resp: 15 12 14 13   Temp: (!) 94.3 F (34.6 C)     TempSrc: Axillary     SpO2: 98%     Weight:      Height:        Intake/Output Summary (Last 24 hours) at 05/21/2017 1742 Last data filed at 05/21/2017 1500 Gross per 24 hour  Intake 10.9 ml  Output 900 ml  Net -889.1 ml   Filed Weights   05/02/2017 2232 05/16/17 0503 05/17/17 0648  Weight: 92.4 kg (203 lb 11.3 oz) 92.3 kg (203 lb 7.8 oz) 93.1 kg (205 lb 4 oz)    Exam:   General: Actively dying, Unable to respond to questions  Cardiovascular: S1, S2 present, no added hrt sound  Respiratory: Bilateral basal crackles   Abdomen: Soft, non-tender, non-distended, BS present  Musculoskeletal: Pitting bilateral edema  Skin: Normal, cold  Psychiatry: Unable to assess   Data Reviewed: CBC: Recent Labs  Lab 05/14/17 1807 05/15/17 0821  WBC  --  5.1  HGB 10.4* 11.2*  HCT 30.9* 33.3*  MCV  --  85.8  PLT  --  197   Basic Metabolic Panel: Recent Labs  Lab 05/15/17 0821 05/16/17 0505  NA 139 141  K 4.1 4.8    CL 109 110  CO2 18* 21*  GLUCOSE 187* 155*  BUN 49* 55*  CREATININE 2.13* 1.95*  CALCIUM 8.4* 8.4*  PHOS  --  3.6   GFR: Estimated Creatinine Clearance: 45.7 mL/min (A) (by C-G formula based on SCr of 1.95 mg/dL (H)). Liver Function Tests: Recent Labs  Lab 05/16/17 0505  ALBUMIN 2.5*   No results for input(s): LIPASE, AMYLASE in the last 168 hours. No results for input(s): AMMONIA in the last 168 hours. Coagulation Profile: No results for input(s): INR, PROTIME in the last 168 hours. Cardiac Enzymes: No results for input(s): CKTOTAL, CKMB, CKMBINDEX, TROPONINI in the last 168 hours. BNP (last 3 results) No results for input(s): PROBNP in the last 8760 hours. HbA1C: No results for input(s): HGBA1C in the last 72 hours. CBG: Recent Labs  Lab 05/14/17 1810 05/14/17 2119 05/15/17 1049  GLUCAP 139* 166* 202*   Lipid Profile: No results for input(s): CHOL, HDL, LDLCALC, TRIG, CHOLHDL, LDLDIRECT in the last 72 hours. Thyroid Function Tests: No results for input(s): TSH, T4TOTAL, FREET4, T3FREE, THYROIDAB in the last 72 hours. Anemia Panel: No results for input(s): VITAMINB12, FOLATE, FERRITIN, TIBC, IRON, RETICCTPCT in the last 72 hours. Urine analysis:    Component Value Date/Time   COLORURINE YELLOW 04/29/2017 1615   APPEARANCEUR HAZY (A) 05/09/2017 1615   LABSPEC 1.011 05/06/2017 1615   PHURINE 5.0 05/25/2017 1615   GLUCOSEU NEGATIVE 05/04/2017 1615   HGBUR NEGATIVE 05/16/2017 1615   BILIRUBINUR NEGATIVE 05/22/2017 1615   KETONESUR NEGATIVE 05/15/2017 1615   PROTEINUR NEGATIVE 04/27/2017 1615   UROBILINOGEN 2.0 (H) 02/20/2014 1339   NITRITE NEGATIVE 05/12/2017 1615   LEUKOCYTESUR NEGATIVE 05/10/2017 1615   Sepsis Labs: @LABRCNTIP (procalcitonin:4,lacticidven:4)  ) Recent Results (from the past 240 hour(s))  Blood Cultures (routine x 2)     Status: None   Collection Time: 05/15/2017  4:22 PM  Result Value Ref Range Status   Specimen Description BLOOD BLOOD  RIGHT FOREARM  Final   Special Requests AEROBIC BOTTLE ONLY Blood Culture adequate volume  Final   Culture NO GROWTH 5 DAYS  Final   Report Status 05/18/2017 FINAL  Final  Urine culture     Status: None   Collection Time: 04/25/2017  5:40 PM  Result Value Ref Range Status   Specimen Description URINE, CATHETERIZED  Final   Special Requests NONE  Final   Culture NO GROWTH  Final   Report Status 05/14/2017 FINAL  Final  Blood Cultures (routine x 2)     Status: None   Collection Time: 04/30/2017  5:40 PM  Result Value Ref Range Status   Specimen Description BLOOD BLOOD RIGHT FOREARM  Final   Special Requests IN PEDIATRIC BOTTLE Blood Culture adequate volume  Final   Culture NO GROWTH 5 DAYS  Final   Report Status 05/18/2017 FINAL  Final  MRSA PCR Screening     Status: Abnormal   Collection Time: 05/02/2017 10:39 PM  Result Value Ref Range Status  MRSA by PCR POSITIVE (A) NEGATIVE Final    Comment: RN,ALECIA POTEAT [id number] @0210  THANEY        The GeneXpert MRSA Assay (FDA approved for NASAL specimens only), is one component of a comprehensive MRSA colonization surveillance program. It is not intended to diagnose MRSA infection nor to guide or monitor treatment for MRSA infections.       Studies: No results found.  Scheduled Meds: . glycopyrrolate  0.2 mg Intravenous Q4H  . LORazepam  2 mg Intravenous Q4H  . sodium chloride flush  3 mL Intravenous Q12H    Continuous Infusions: . sodium chloride    . morphine 1 mg/hr (05/21/17 1500)     LOS: 8 days     Briant Cedar, MD [facility]   If 7PM-7AM, please contact night-coverage [email or web address] Password [facility] 05/21/2017, 5:42 PM

## 2017-05-21 NOTE — Progress Notes (Signed)
Daily Progress Note   Patient Name: [name]       Date: 05/21/2017 DOB: 08-01-1955  Age: 62 y.o. MRN#: [id number] Attending Physician: Briant Cedar, MD Primary Care Physician: [facility] Admit Date: 20-May-2017  Reason for Consultation/Follow-up: Inpatient hospice referral, Non pain symptom management, Pain control, Psychosocial/spiritual support and Terminal Care  Subjective: Patient seen, chart reviewed.  No family at the bedside.  Staffed with bedside RN.  Per chart review, several PRN's noted for pain as well as Ativan for agitation  Length of Stay: 8  Current Medications: Scheduled Meds:  . glycopyrrolate  0.2 mg Intravenous Q4H  . LORazepam  2 mg Intravenous Q4H  . sodium chloride flush  3 mL Intravenous Q12H    Continuous Infusions: . sodium chloride    . morphine      PRN Meds: sodium chloride, acetaminophen **OR** acetaminophen, antiseptic oral rinse, [DISCONTINUED] glycopyrrolate **OR** [DISCONTINUED] glycopyrrolate **OR** glycopyrrolate, [DISCONTINUED] haloperidol **OR** haloperidol **OR** haloperidol lactate, [DISCONTINUED] LORazepam **OR** LORazepam **OR** LORazepam, morphine, ondansetron **OR** ondansetron (ZOFRAN) IV, oxymetazoline, polyvinyl alcohol, sodium chloride flush  Physical Exam  Constitutional: He appears well-developed and well-nourished.  Patient actively dying; nonverbal  HENT:  Head: Normocephalic and atraumatic.  Neck: Normal range of motion.  Pulmonary/Chest:  Respiratory pattern irregular, mild upper airway secretions Intermittent, brief 10-15-second apnea noted  Abdominal: Soft.  Genitourinary:  Genitourinary Comments: Condom catheter  Musculoskeletal: Normal range of motion.  Neurological:  Patient has been restless,  agitated He is no longer speaking or able to eat or drink Does not follow commands  Skin: Skin is warm and dry.  Psychiatric:  Psychomotor restlessness observed  Nursing note and vitals reviewed.           Vital Signs: BP (!) 121/91 (BP Location: Left Arm)   Pulse 93   Temp (!) 94.3 F (34.6 C) (Axillary)   Resp 15   Ht 6\' 2"  (1.88 m)   Wt 93.1 kg (205 lb 4 oz)   SpO2 98%   BMI 26.35 kg/m  SpO2: SpO2: 98 % O2 Device: O2 Device: Not Delivered O2 Flow Rate:    Intake/output summary:   Intake/Output Summary (Last 24 hours) at 05/21/2017 1005 Last data filed at 05/21/2017 0924 Gross per 24 hour  Intake 3 ml  Output 250 ml  Net -  247 ml   LBM: Last BM Date: 05/17/17 Baseline Weight: Weight: 92.4 kg (203 lb 11.3 oz) Most recent weight: Weight: 93.1 kg (205 lb 4 oz)       Palliative Assessment/Data:    Flowsheet Rows     Most Recent Value  Intake Tab  Referral Department  Hospitalist  Unit at Time of Referral  Med/Surg Unit  Palliative Care Primary Diagnosis  Cardiac  Date Notified  05/14/17  Palliative Care Type  New Palliative care  Reason for referral  Clarify Goals of Care  Date of Admission  04/27/2017  Date first seen by Palliative Care  05/15/17  # of days Palliative referral response time  1 Day(s)  # of days IP prior to Palliative referral  1  Clinical Assessment  Psychosocial & Spiritual Assessment  Palliative Care Outcomes      Patient Active Problem List   Diagnosis Date Noted  . Palliative care by specialist   . Terminal care   . Acute renal failure (ARF) (HCC) 05/17/2017  . Comfort measures only status   . DNR (do not resuscitate)   . ARF (acute renal failure) (HCC) 05/07/2017  . Rectal bleeding 05/06/2017  . Edema 05/22/2017  . Altered mental status 05/01/2017  . Prostate abscess 02/18/2017  . AKI (acute kidney injury) (HCC) 02/18/2017  . Myocardial ischemia   . Pulmonary embolus (HCC)   . Cocaine abuse (HCC)   . H/O noncompliance with  medical treatment, presenting hazards to health 12/04/2016  . Chronic systolic CHF (congestive heart failure) (HCC) 12/04/2016  . Anxiety state   . Depression with anxiety 08/05/2016  . DNR (do not resuscitate) discussion   . Palliative care encounter   . Polysubstance abuse (HCC)   . Type 2 diabetes mellitus with complication (HCC)   . Hypertensive heart disease with chronic systolic congestive heart failure (HCC)   . 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 Hyponatremia 02/23/2014  . Alcohol abuse 12/21/2012  . Cocaine dependence (HCC) 12/21/2012  . Substance induced mood disorder (HCC) 12/21/2012  . Coronary artery disease 08/14/2012  . Cardiomyopathy, nonischemic (HCC) 08/14/2012    Palliative Care Assessment & Plan   Patient Profile: 62 y.o.malewith past medical history of advanced heart failure with an EF of 10-15%, polysubstance abuse, chronic kidney disease (with 1 kidney) and possible dementiawho was admitted on 1/19/2019with rectal bleeding, altered mental status, hypothermia and hypoglycemia.   PMT was consulted for goals of care.  Patient now comfort care and actively dying   Recommendations/Plan:  Pain: Patient received numerous PRN doses of morphine in addition to scheduled morphine.  Start morphine drip at 1 mg an hour and 4 mg every 30 minutes as needed  Dyspnea: Continue with O2 and targeted pulmonary treatments; starting morphine drip for both pain as well as dyspnea management  Secretions: Needs improvement.  Continue Robinul, changed to every 4 hours around-the-clock  Agitation: Needs improvement.  Increase Ativan to 2 mg every 4 hours.  Monitor and titrate for effect.  May need to consider a Versed drip  Goals of Care and Additional Recommendations:  Limitations on Scope of Treatment: Full Comfort Care  Code Status:    Code Status Orders  (From admission, onward)        Start     Ordered   05/19/17 1341  Do not attempt resuscitation (DNR)   Continuous    Question Answer Comment  In the event of cardiac or respiratory ARREST Do not call a "code blue"  In the event of cardiac or respiratory ARREST Do not perform Intubation, CPR, defibrillation or ACLS   In the event of cardiac or respiratory ARREST Use medication by any route, position, wound care, and other measures to relive pain and suffering. May use oxygen, suction and manual treatment of airway obstruction as needed for comfort.      05/19/17 1340    Code Status History    Date Active Date Inactive Code Status Order ID Comments User Context   05/17/2017 09:51 05/19/2017 13:40 DNR [id number]  Trevor Iha, PA-C Inpatient   05/15/2017 13:46 05/17/2017 09:51 DNR [id number]  Trevor Iha, PA-C Inpatient   2017/05/22 22:31 05/15/2017 13:46 Full Code [id number]  Pearson Grippe, MD Inpatient   02/18/2017 06:47 02/28/2017 19:44 Full Code [id number]  Pearson Grippe, MD ED   12/04/2016 14:15 12/07/2016 19:54 Full Code [id number]  Valentino Nose, MD Inpatient   08/05/2016 21:58 08/13/2016 15:44 Full Code [id number]  Briscoe Deutscher, MD ED   04/11/2016 01:03 04/14/2016 16:07 Full Code [id number]  Hillary Bow, DO ED   09/14/2015 15:18 09/16/2015 20:48 Partial Code [id number]  Canary Brim, NP Inpatient   09/07/2015 11:18 09/14/2015 15:18 Full Code [id number]  Jeralyn Bennett, MD Inpatient   09/07/2015 07:55 09/07/2015 11:18 Full Code [id number]  Leta Baptist, MD ED   03/10/2015 04:33 03/12/2015 19:41 Full Code [id number]  Hillary Bow, DO ED   02/20/2014 18:13 02/27/2014 16:52 Full Code [id number]  Catarina Hartshorn, MD Inpatient   08/11/2012 12:29 08/14/2012 21:19 Full Code [id number]  Judie Bonus, MD Inpatient       Prognosis:   Hours - Days  Discharge Planning:  Anticipated Hospital Death   Thank you for allowing the Palliative Medicine Team to assist in the care of this patient.   Time In: 1000 Time Out: 1025 Total Time 25 min Prolonged Time Billed  no       Greater  than 50%  of this time was spent counseling and coordinating care related to the above assessment and plan.  Irean Hong, NP  Please contact Palliative Medicine Team phone at [phone] for questions and concerns.

## 2017-05-21 NOTE — Progress Notes (Signed)
Pt temp was 94.6 axillary applied warm blanket.

## 2017-05-22 NOTE — Progress Notes (Signed)
PROGRESS NOTE  [name] QBH:[id number] DOB: [birth year] DOA: 05/22/2017 PCP: [facility]  HPI/Recap of past 24 hours: [name] a61 y.o.male,w CHF(EF 10-15%), CAD, Dm2, Hypertension, hx of prostate abscess s/p drainage, 02/18/2017, ? Dementia, apparently has had altered mental status per sister over the past 1 day. Pt has also had slight increase in lower ext edema over the past 1 month and rectal bleeding. Pt does have history of hemorrhoids, His sister can't recall if he had has a colonoscopy in the past. Pt denies fever, chills, cp, palp, sob, n/v, abd pain, diarrhea, black stool. Pt was brought in for rectal bleeding. In ED, hgb noted to be stable. Was noted to be hypothermic, hypoglycemic, acute encephalopathy with AKI. Pt was started on IV antibiotics to cover for infection, and xerolto held due to GI bleed. Also regarding rectal bleeding. Palliative care was consulted, pt is currently DNR and has been made hospice.  Today, patient sleeping/actively dying, not in acute distress, nonverbal, still responsive to pain/touch  Assessment/Plan: Principal Problem:   ARF (acute renal failure) (HCC) Active Problems:   Cardiomyopathy, nonischemic (HCC)   Rectal bleeding   Edema   Altered mental status   DNR (do not resuscitate)   Comfort measures only status   Acute renal failure (ARF) (HCC)   Terminal care   Palliative care by specialist  Acute metabolic encephalopathy Looks comfortable, actively dying Multifactorial, hypoglycemia, hypothermia, ??infection CT head no acute abnormalities Hospice, no further work-up   Hypoglycemia Resolved  Hypothermia Patient is actively dying Chest x ray with atelectasis vs consolidation  Sepsis w/u all NEGATIVE: lactic acid 1.1, Pro-calcitonin less than 0.1, PNA legionella antigen, strept pneumonia all negative, blood culture no growth to date.  UA negative  No further management, discontinued all  antibiotics  Rectal Bleeding Resolved No further episode per nurse report, hgb stable D/C AC  AKI Likely cardiorenal syndrome  Renal US negative for obstruction Nephrology consulted, signed off No further labs or work up  Systolic Heart failure EF 15 % ECHO 07-2016   History of PE Diagnosed on 12-04-2016; was on xarelto prior to admission He has received 5 month treatment, currently d/c as pt is hospice  Hypothyroidism TSH elevated at 5, Free T 3 low, free T4 normal Started low dose synthroid, currently discontinued as pt is hospice    Code Status: DNR   Family Communication: None at bedside  Disposition Plan: Patient is actively dying   Consultants:  Nephrology  Palliative team  Procedures:  None   Antimicrobials:  None  DVT prophylaxis: None   Objective: Vitals:   05/21/17 1036 05/21/17 1415 05/22/17 0622 05/22/17 1300  BP:   122/69 105/70  Pulse:   86 96  Resp: 14 13 13 16   Temp:   (!) 95.6 F (35.3 C) (!) 96.7 F (35.9 C)  TempSrc:   Rectal Axillary  SpO2:   (!) 86% (!) 75%  Weight:      Height:        Intake/Output Summary (Last 24 hours) at 05/22/2017 1759 Last data filed at 05/22/2017 1341 Gross per 24 hour  Intake 15 ml  Output -  Net 15 ml   Filed Weights   05/15/2017 2232 05/16/17 0503 05/17/17 0648  Weight: 92.4 kg (203 lb 11.3 oz) 92.3 kg (203 lb 7.8 oz) 93.1 kg (205 lb 4 oz)    Exam:   General: Actively dying, Unable to respond to questions  Cardiovascular: S1, S2 present, no added hrt  sound   Respiratory: Bilateral basal crackles   Abdomen: Soft, non-tender, non-distended, BS present  Musculoskeletal: Pitting bilateral edema  Skin: Normal  Psychiatry: Unable to assess   Data Reviewed: CBC: No results for input(s): WBC, NEUTROABS, HGB, HCT, MCV, PLT in the last 168 hours. Basic Metabolic Panel: Recent Labs  Lab 05/16/17 0505  NA 141  K 4.8  CL 110  CO2 21*  GLUCOSE 155*  BUN 55*  CREATININE 1.95*   CALCIUM 8.4*  PHOS 3.6   GFR: Estimated Creatinine Clearance: 45.7 mL/min (A) (by C-G formula based on SCr of 1.95 mg/dL (H)). Liver Function Tests: Recent Labs  Lab 05/16/17 0505  ALBUMIN 2.5*   No results for input(s): LIPASE, AMYLASE in the last 168 hours. No results for input(s): AMMONIA in the last 168 hours. Coagulation Profile: No results for input(s): INR, PROTIME in the last 168 hours. Cardiac Enzymes: No results for input(s): CKTOTAL, CKMB, CKMBINDEX, TROPONINI in the last 168 hours. BNP (last 3 results) No results for input(s): PROBNP in the last 8760 hours. HbA1C: No results for input(s): HGBA1C in the last 72 hours. CBG: No results for input(s): GLUCAP in the last 168 hours. Lipid Profile: No results for input(s): CHOL, HDL, LDLCALC, TRIG, CHOLHDL, LDLDIRECT in the last 72 hours. Thyroid Function Tests: No results for input(s): TSH, T4TOTAL, FREET4, T3FREE, THYROIDAB in the last 72 hours. Anemia Panel: No results for input(s): VITAMINB12, FOLATE, FERRITIN, TIBC, IRON, RETICCTPCT in the last 72 hours. Urine analysis:    Component Value Date/Time   COLORURINE YELLOW 04/25/2017 1615   APPEARANCEUR HAZY (A) 05/04/2017 1615   LABSPEC 1.011 05/03/2017 1615   PHURINE 5.0 04/29/2017 1615   GLUCOSEU NEGATIVE 05/22/2017 1615   HGBUR NEGATIVE 05/12/2017 1615   BILIRUBINUR NEGATIVE 05/12/2017 1615   KETONESUR NEGATIVE 05/05/2017 1615   PROTEINUR NEGATIVE 04/28/2017 1615   UROBILINOGEN 2.0 (H) 02/20/2014 1339   NITRITE NEGATIVE 05/09/2017 1615   LEUKOCYTESUR NEGATIVE 05/03/2017 1615   Sepsis Labs: @LABRCNTIP (procalcitonin:4,lacticidven:4)  ) Recent Results (from the past 240 hour(s))  Blood Cultures (routine x 2)     Status: None   Collection Time: 05/05/2017  4:22 PM  Result Value Ref Range Status   Specimen Description BLOOD BLOOD RIGHT FOREARM  Final   Special Requests AEROBIC BOTTLE ONLY Blood Culture adequate volume  Final   Culture NO GROWTH 5 DAYS  Final    Report Status 05/18/2017 FINAL  Final  Urine culture     Status: None   Collection Time: 05/02/2017  5:40 PM  Result Value Ref Range Status   Specimen Description URINE, CATHETERIZED  Final   Special Requests NONE  Final   Culture NO GROWTH  Final   Report Status 05/14/2017 FINAL  Final  Blood Cultures (routine x 2)     Status: None   Collection Time: 05/04/2017  5:40 PM  Result Value Ref Range Status   Specimen Description BLOOD BLOOD RIGHT FOREARM  Final   Special Requests IN PEDIATRIC BOTTLE Blood Culture adequate volume  Final   Culture NO GROWTH 5 DAYS  Final   Report Status 05/18/2017 FINAL  Final  MRSA PCR Screening     Status: Abnormal   Collection Time: 05/02/2017 10:39 PM  Result Value Ref Range Status   MRSA by PCR POSITIVE (A) NEGATIVE Final    Comment: RN,ALECIA POTEAT [id number] @0210  THANEY        The GeneXpert MRSA Assay (FDA approved for NASAL specimens only), is one component of a comprehensive  MRSA colonization surveillance program. It is not intended to diagnose MRSA infection nor to guide or monitor treatment for MRSA infections.       Studies: No results found.  Scheduled Meds: . glycopyrrolate  0.2 mg Intravenous Q4H  . LORazepam  2 mg Intravenous Q4H  . sodium chloride flush  3 mL Intravenous Q12H    Continuous Infusions: . sodium chloride    . morphine 1 mg/hr (05/21/17 1500)     LOS: 9 days     Briant Cedar, MD [facility]   If 7PM-7AM, please contact night-coverage [email or web address] Password [facility] 05/22/2017, 5:59 PM

## 2017-05-22 NOTE — Progress Notes (Addendum)
Palliative Medicine RN Note: Patient's sister Lupita Leash left a message on the PMT cell phone voicemail. I attempted to return her call but had to leave a voicemail.   Margret Chance Tejon Gracie, RN, BSN, [facility] Palliative Medicine Team 05/22/2017 1:31 PM Office [phone]  1355 Addendum: Lupita Leash called back, asking to speak to [facility] (PMT PA, not on service today) to see how she thinks he compares to last week. I explained that today is my first day to see him and that he was comfortable. She verbalized thanks for our help.   Margret Chance Gregoria Selvy, RN, BSN, [facility] Palliative Medicine Team 05/22/2017 1:56 PM Office [phone]

## 2017-05-22 NOTE — Progress Notes (Signed)
Palliative Medicine RN Note: Symptom check. Patient has very shallow respirations with Cheyne Stokes pattern; apnea over 30 seconds during my visit. Saw patient with the RN; he is currently managed by prescribed regimen. Respiratory status is very unstable.   I discussed the patient with Dr Phillips Odor. Mr [name] is dying more slowly than expected. However, PMT Medical Director feels he is too unstable to move to inpatient hospice. Our team would support that decision only if the family agreed and is willing to risk him dying in the ambulance.  We will see him again tomorrow to ensure continued comfort if he survives the night.  Margret Chance Mickelle Goupil, RN, BSN, [facility] Palliative Medicine Team 05/22/2017 11:18 AM Office [phone]

## 2017-05-22 NOTE — Progress Notes (Signed)
Nutrition Brief Note  Chart reviewed. Pt now transitioning to comfort care.  No further nutrition interventions warranted at this time.  Please re-consult as needed.   Damone Fancher A. Gwen Edler, RD, LDN, CDE Pager: [phone] After hours Pager: [phone]  

## 2017-05-26 NOTE — Progress Notes (Signed)
Pt. expired 05/24/2015 0020.Family notified and come to see their love one.Pt was getting Morphine 250 mg/ 250 ml continuous infusion for comfort.About 170 ml is discarded with witness.

## 2017-05-26 NOTE — Death Summary Note (Signed)
Death Summary  CULLEY HEDEEN ZOX:[id number] DOB: September 27, 1955 DOA: 05-15-2017  PCP: [facility]  Admit date: May 15, 2017 Date of Death: 05-25-17 Time of Death: 00:20 Notification: [facility] notified of death of 05/25/2017   History of present illness:  [name],w CHF(EF 10-15%),CAD, Dm2, Hypertension, hx of prostate abscess s/p drainage, 02/18/2017, ? Dementia, apparently has had altered mental status per sister over the past 1 day. Pt has also had slight increase in lower ext edema over the past 1 month and rectal bleeding. Pt does have history of hemorrhoids, His sister can't recall if he had has a colonoscopy in the past. Pt denies fever, chills, cp, palp, sob, n/v, abd pain, diarrhea, black stool. Pt was brought in for rectal bleeding. In ED, hgb noted to be stable. Was noted to be hypothermic, hypoglycemic, acute encephalopathy with AKI. Pt was started on IV antibiotics to cover for infection, and xerolto held due to GI bleed. Also regarding rectal bleeding. Palliative care was consulted, pt is DNR and has been made hospice. Pt was transferred to the hospice unit and passed away on 2017-05-25 at 00:20   Final Diagnoses:   Acute metabolic encephalopathy Multifactorial, hypoglycemia, hypothermia, ??infection CT head no acute abnormalities Hospice, no further work-up  Hypoglycemia Resolved  Hypothermia Chest x ray with atelectasis vs consolidation  Sepsis w/u all NEGATIVE: lactic acid 1.1, Pro-calcitonin less than 0.1, PNA legionella antigen, strept pneumonia all negative, blood culture no growth to date. UA negative  No further management, discontinued all antibiotics  Rectal Bleeding Resolved No further episode per nurse report, hgb stable D/C AC  AKI Likely cardiorenal syndrome Renal US negative for obstruction Nephrology consulted, signed off No further labs or work up  Systolic Heart failure EF 15 % ECHO 07-2016    History of PE Diagnosed on 12-04-2016; was on xarelto prior to admission He has received 5 month treatment, no further txt  Hypothyroidism TSH elevated at 5, Free T 3 low, free T4 normal Started low dose synthroid, discontinued as pt was hospice     The results of significant diagnostics from this hospitalization (including imaging, microbiology, ancillary and laboratory) are listed below for reference.    Significant Diagnostic Studies: Dg Chest 2 View  Result Date: 05-15-2017 CLINICAL DATA:  62 year old male with history of altered mental status. Rectal bleeding. Lethargy. EXAM: CHEST  2 VIEW COMPARISON:  Chest x-ray 04/10/2017. FINDINGS: Lung volumes are low. Bibasilar opacities (right greater than left), similar to the prior study, which may reflect areas of atelectasis and/or airspace consolidation, with superimposed small to moderate bilateral pleural effusions. No evidence of pulmonary edema. Heart size appears borderline enlarged. Upper mediastinal contours are within normal limits. IMPRESSION: 1. Low lung volumes with small to moderate bilateral pleural effusions and associated areas of atelectasis and/or consolidation in the lung bases bilaterally, very similar to prior study from 04/10/2017. Electronically Signed   By: Trudie Reed M.D.   On: May 15, 2017 17:23   Ct Head Wo Contrast  Result Date: May 15, 2017 CLINICAL DATA:  Headache EXAM: CT HEAD WITHOUT CONTRAST TECHNIQUE: Contiguous axial images were obtained from the base of the skull through the vertex without intravenous contrast. COMPARISON:  10/23/2008 FINDINGS: Brain: No evidence of acute infarction, hemorrhage, hydrocephalus, extra-axial collection or mass lesion/mass effect. Moderate generalized atrophy. Moderate low-density in the cerebral white matter, occipital parietal predominant and compatible with chronic small vessel ischemia given patient's risk factors. Left superior temporal gliosis that is likely post  ischemic. Vascular: Atherosclerotic calcification.  No hyperdense vessel. Skull: No acute or aggressive finding. Sinuses/Orbits: Negative IMPRESSION: 1. No acute finding or explanation for headache. 2. Atrophy and ischemic injury that has progressed from 2010. Electronically Signed   By: Marnee Spring M.D.   On: 2017/06/08 17:56   US Renal  Result Date: 06-08-17 CLINICAL DATA:  Acute renal failure. EXAM: RENAL / URINARY TRACT ULTRASOUND COMPLETE COMPARISON:  08/08/2016 FINDINGS: Right Kidney: Length: 12.2 cm. Increased echogenicity. No mass or hydronephrosis visualized. Left Kidney: Length: 11.0 cm. Increased echogenicity. No mass. Mild left pelviectasis. Bladder: Appears normal for degree of bladder distention. Bilateral ureteral jets are not seen. Moderate amount of abdominopelvic ascites. Bilateral pleural effusions. IMPRESSION: Increased echogenicity of bilateral kidneys consistent with medical renal disease. Mild left renal pelviectasis. Moderate abdominopelvic ascites. Bilateral pleural effusions. Electronically Signed   By: Ted Mcalpine M.D.   On: Jun 08, 2017 21:41    Microbiology: Recent Results (from the past 240 hour(s))  MRSA PCR Screening     Status: Abnormal   Collection Time: Jun 08, 2017 10:39 PM  Result Value Ref Range Status   MRSA by PCR POSITIVE (A) NEGATIVE Final    Comment: RN,ALECIA POTEAT [id number] @0210  THANEY        The GeneXpert MRSA Assay (FDA approved for NASAL specimens only), is one component of a comprehensive MRSA colonization surveillance program. It is not intended to diagnose MRSA infection nor to guide or monitor treatment for MRSA infections.      Labs: Basic Metabolic Panel: No results for input(s): NA, K, CL, CO2, GLUCOSE, BUN, CREATININE, CALCIUM, MG, PHOS in the last 168 hours. Liver Function Tests: No results for input(s): AST, ALT, ALKPHOS, BILITOT, PROT, ALBUMIN in the last 168 hours. No results for input(s): LIPASE, AMYLASE in the last  168 hours. No results for input(s): AMMONIA in the last 168 hours. CBC: No results for input(s): WBC, NEUTROABS, HGB, HCT, MCV, PLT in the last 168 hours. Cardiac Enzymes: No results for input(s): CKTOTAL, CKMB, CKMBINDEX, TROPONINI in the last 168 hours. D-Dimer No results for input(s): DDIMER in the last 72 hours. BNP: Invalid input(s): POCBNP CBG: No results for input(s): GLUCAP in the last 168 hours. Anemia work up No results for input(s): VITAMINB12, FOLATE, FERRITIN, TIBC, IRON, RETICCTPCT in the last 72 hours. Urinalysis    Component Value Date/Time   COLORURINE YELLOW 08-Jun-2017 1615   APPEARANCEUR HAZY (A) 06-08-17 1615   LABSPEC 1.011 08-Jun-2017 1615   PHURINE 5.0 06/08/17 1615   GLUCOSEU NEGATIVE 06-08-17 1615   HGBUR NEGATIVE 2017/06/08 1615   BILIRUBINUR NEGATIVE 08-Jun-2017 1615   KETONESUR NEGATIVE 06/08/17 1615   PROTEINUR NEGATIVE 06-08-2017 1615   UROBILINOGEN 2.0 (H) 02/20/2014 1339   NITRITE NEGATIVE 06-08-17 1615   LEUKOCYTESUR NEGATIVE 08-Jun-2017 1615   Sepsis Labs Invalid input(s): PROCALCITONIN,  WBC,  LACTICIDVEN     SIGNED:  Briant Cedar, MD  [facility] 05/16/2017, 7:55 PM  If 7PM-7AM, please contact night-coverage [email or web address] Password [email or web address]

## 2017-05-26 DEATH — deceased

## 2018-09-04 IMAGING — CT CT HEAD W/O CM
3 series · 16 of 47 positions shown, 19 images · non-contrast
Comparison: 10/23/2008

CLINICAL DATA: Headache

EXAM:
CT HEAD WITHOUT CONTRAST
TECHNIQUE: Contiguous axial images were obtained from the base of the skull
through the vertex without intravenous contrast.

[Series 3: head 5.0 h30s · axial · 0.43mm/px · z∈[-66,+84]mm · 10 of 36 slices shown, 13 images]
[im 3/36  brain]
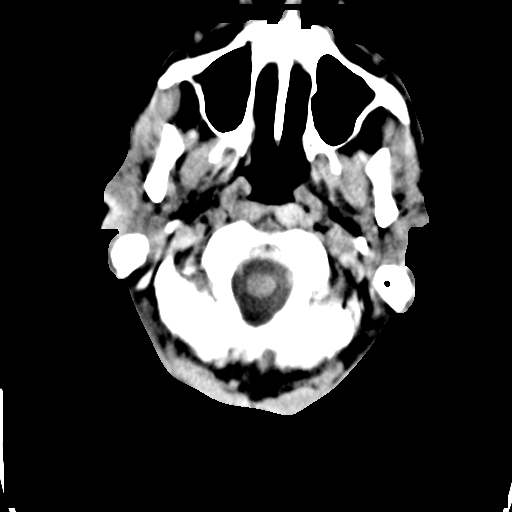
[im 3/36  bone]
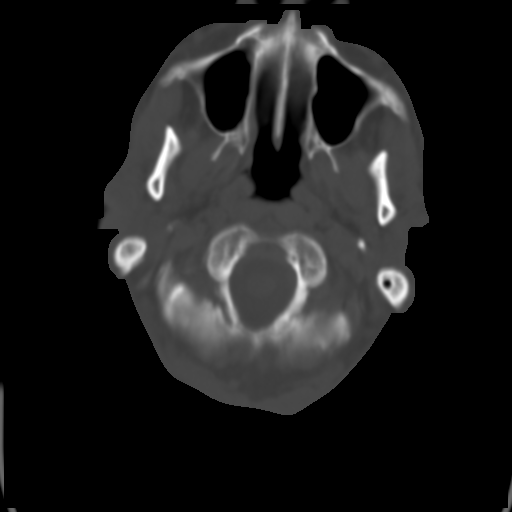
[im 7/36  brain]
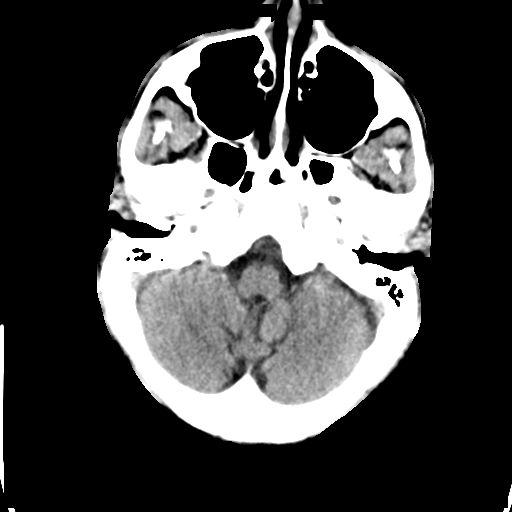
[im 10/36  brain]
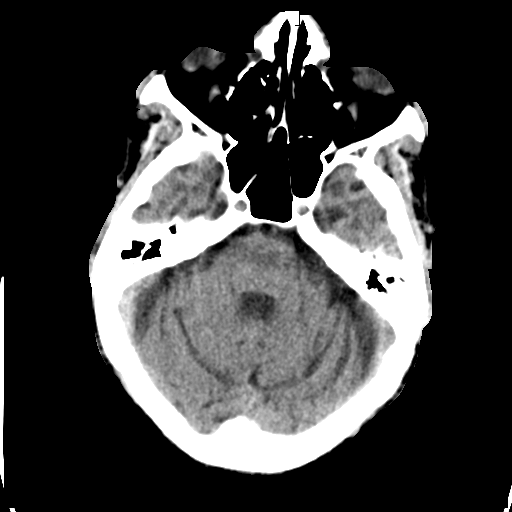
[im 13/36  brain]
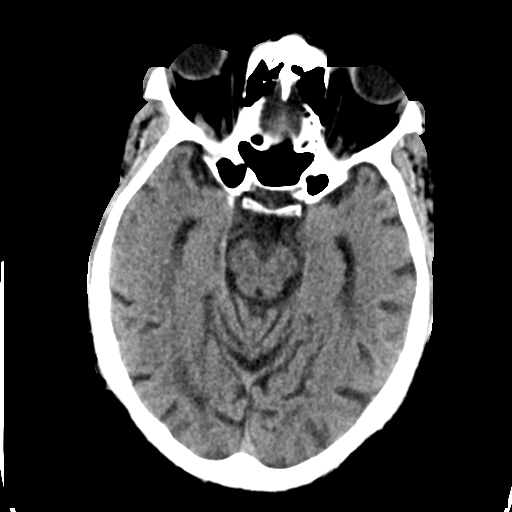
[im 16/36  brain]
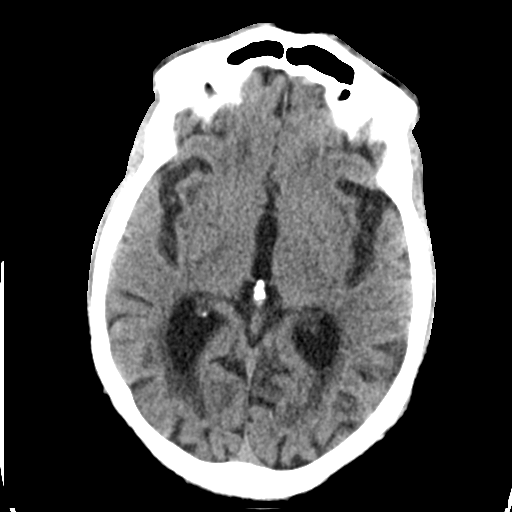
[im 16/36  bone]
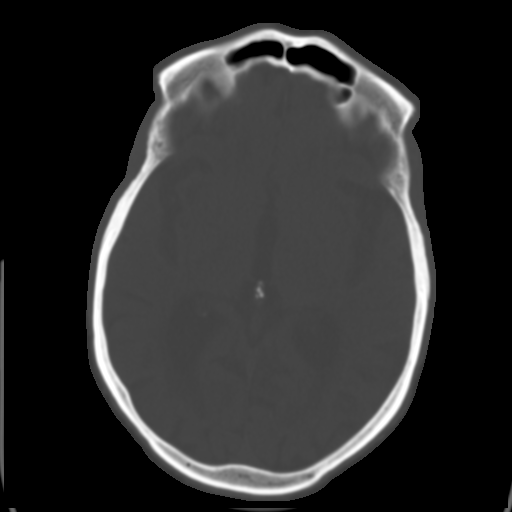
[im 20/36  brain]
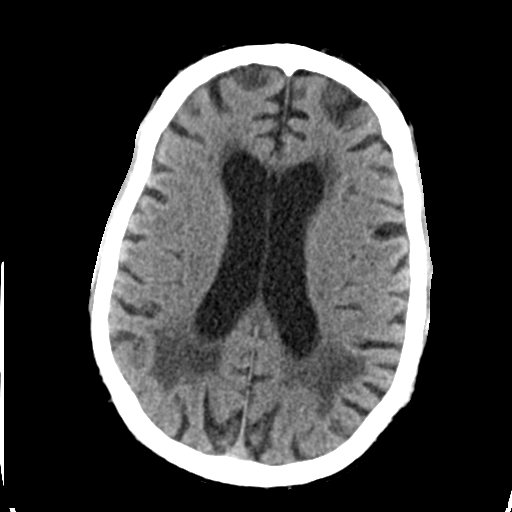
[im 23/36  brain]
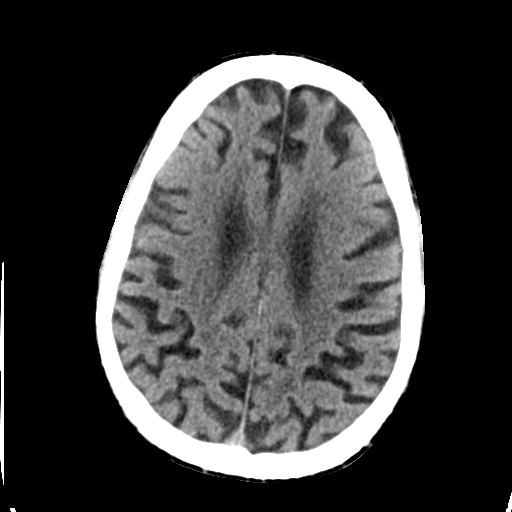
[im 27/36  brain]
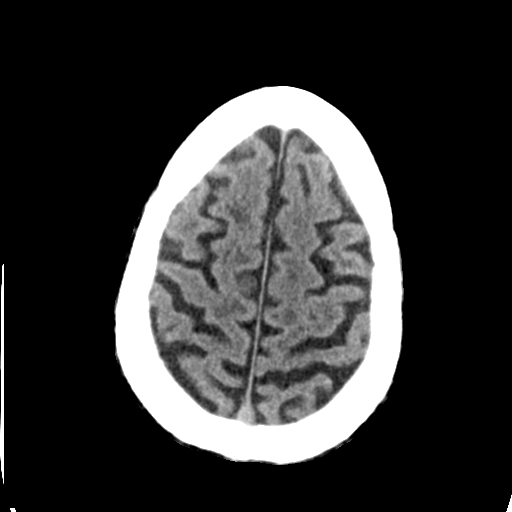
[im 29/36  brain]
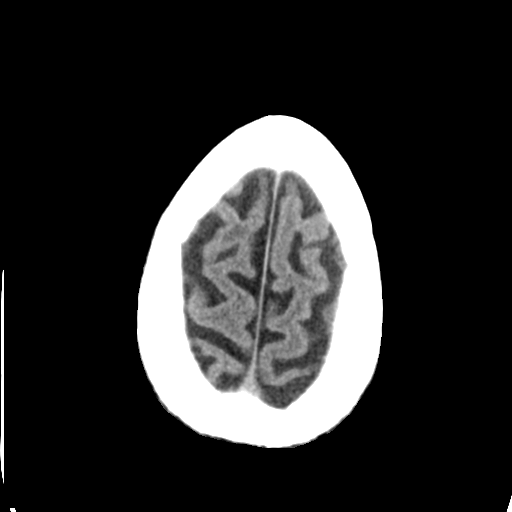
[im 29/36  bone]
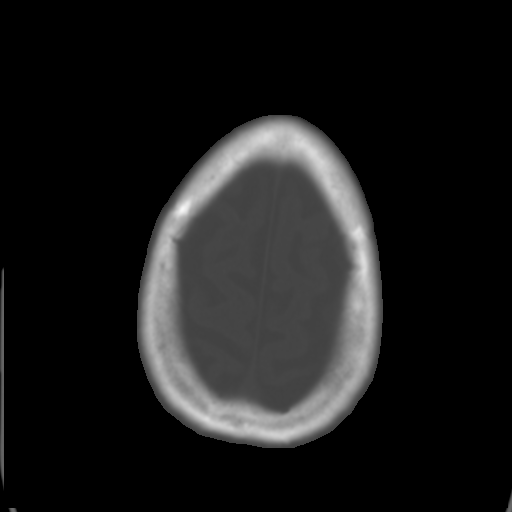
[im 33/36  brain]
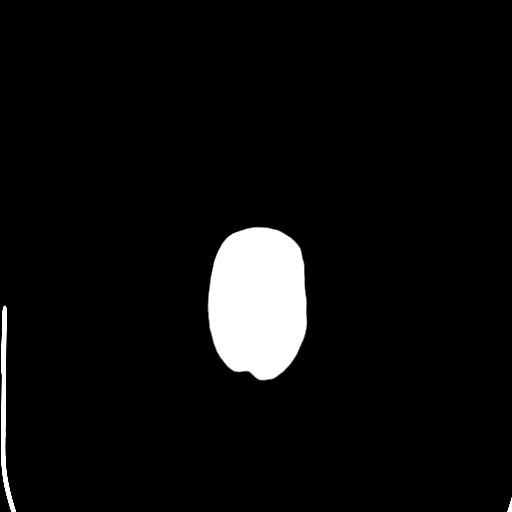

[Series 5: head 3.0 mpr cor · coronal · 0.33mm/px · 3 of 67 slices shown]
[im 23/67  brain]
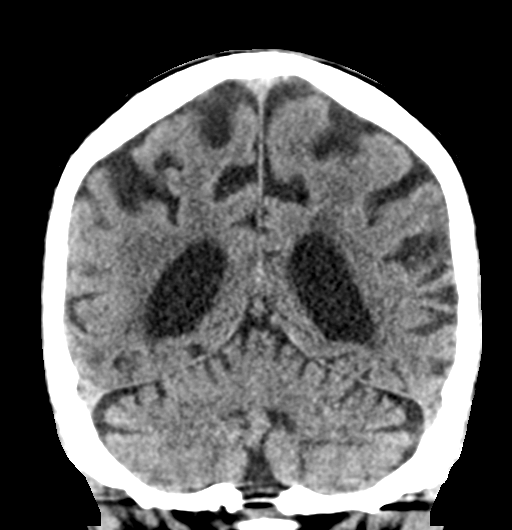
[im 30/67  brain]
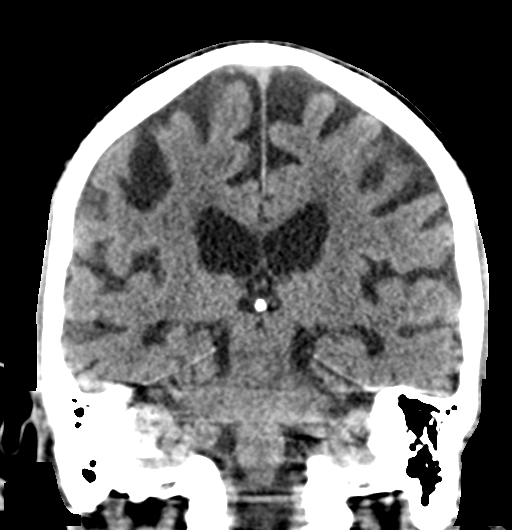
[im 37/67  brain]
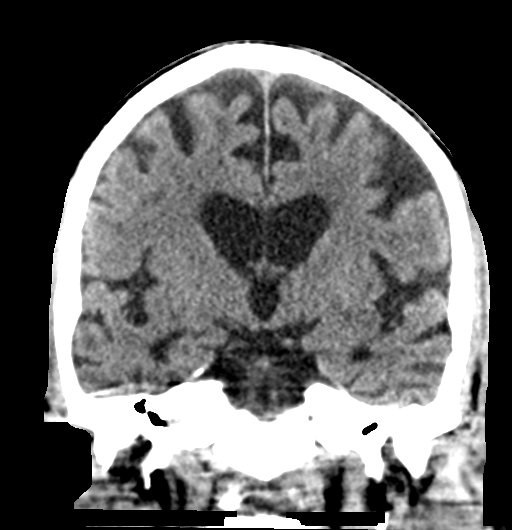

[Series 6: head 3.0 mpr sag · sagittal · 0.34mm/px · 3 of 65 slices shown]
[im 22/65  brain]
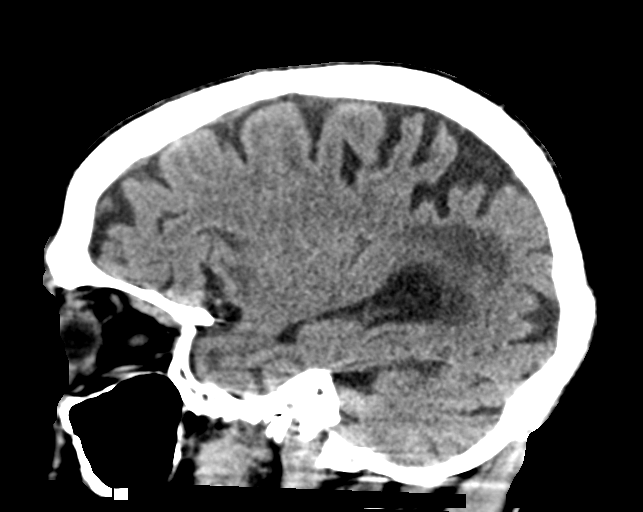
[im 33/65  brain]
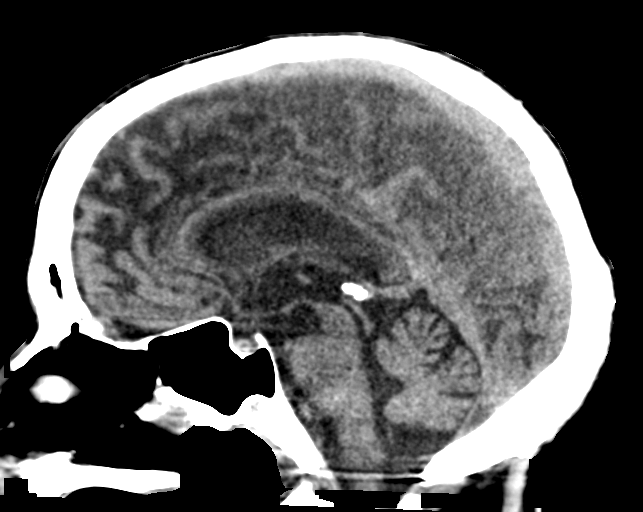
[im 43/65  brain]
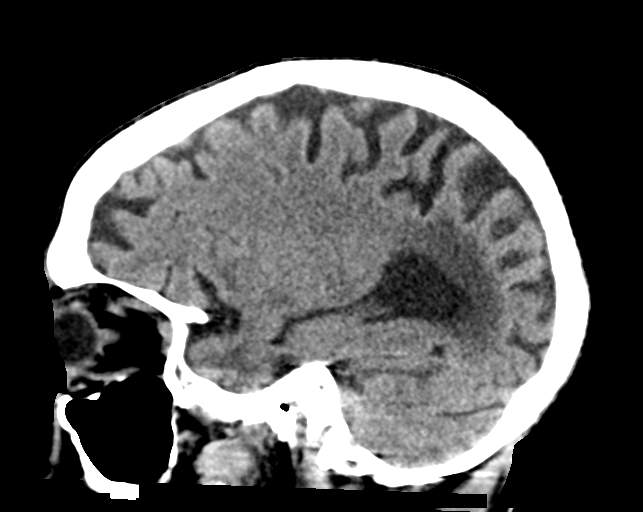

[16 of 47 positions shown; findings below may reference images not displayed]

FINDINGS: Brain: No evidence of acute infarction, hemorrhage, hydrocephalus,
extra-axial collection or mass lesion/mass effect. Moderate
generalized atrophy. Moderate low-density in the cerebral white
matter, occipital parietal predominant and compatible with chronic
small vessel ischemia given patient's risk factors. Left superior
temporal gliosis that is likely post ischemic.

Vascular: Atherosclerotic calcification.  No hyperdense vessel.

Skull: No acute or aggressive finding.

Sinuses/Orbits: Negative
IMPRESSION: 1. No acute finding or explanation for headache.
2. Atrophy and ischemic injury that has progressed from 5525.

## 2018-09-04 IMAGING — DX DG CHEST 2V
2 series · 2 of 2 positions shown · non-contrast
Comparison: Chest x-ray 04/10/2017.

CLINICAL DATA: 61-year-old male with history of altered mental
status. Rectal bleeding. Lethargy.

EXAM:
CHEST  2 VIEW

[x chest ap]
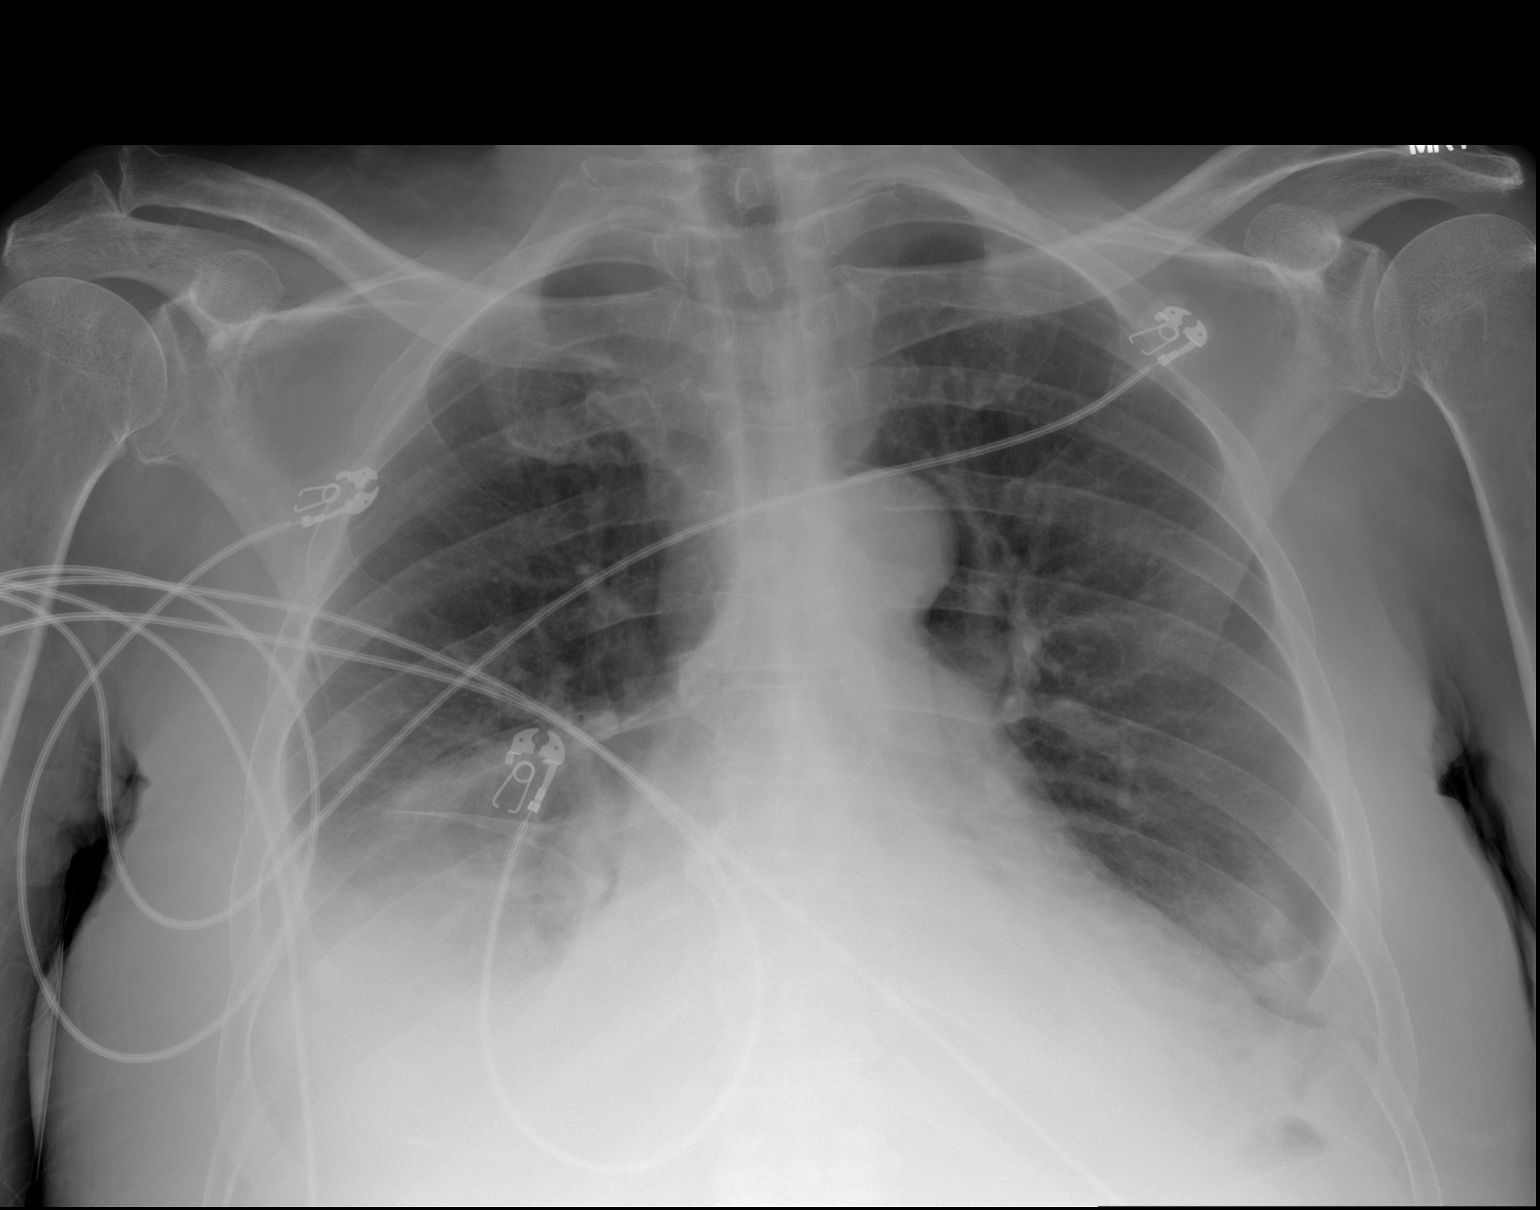

[w chest lat]
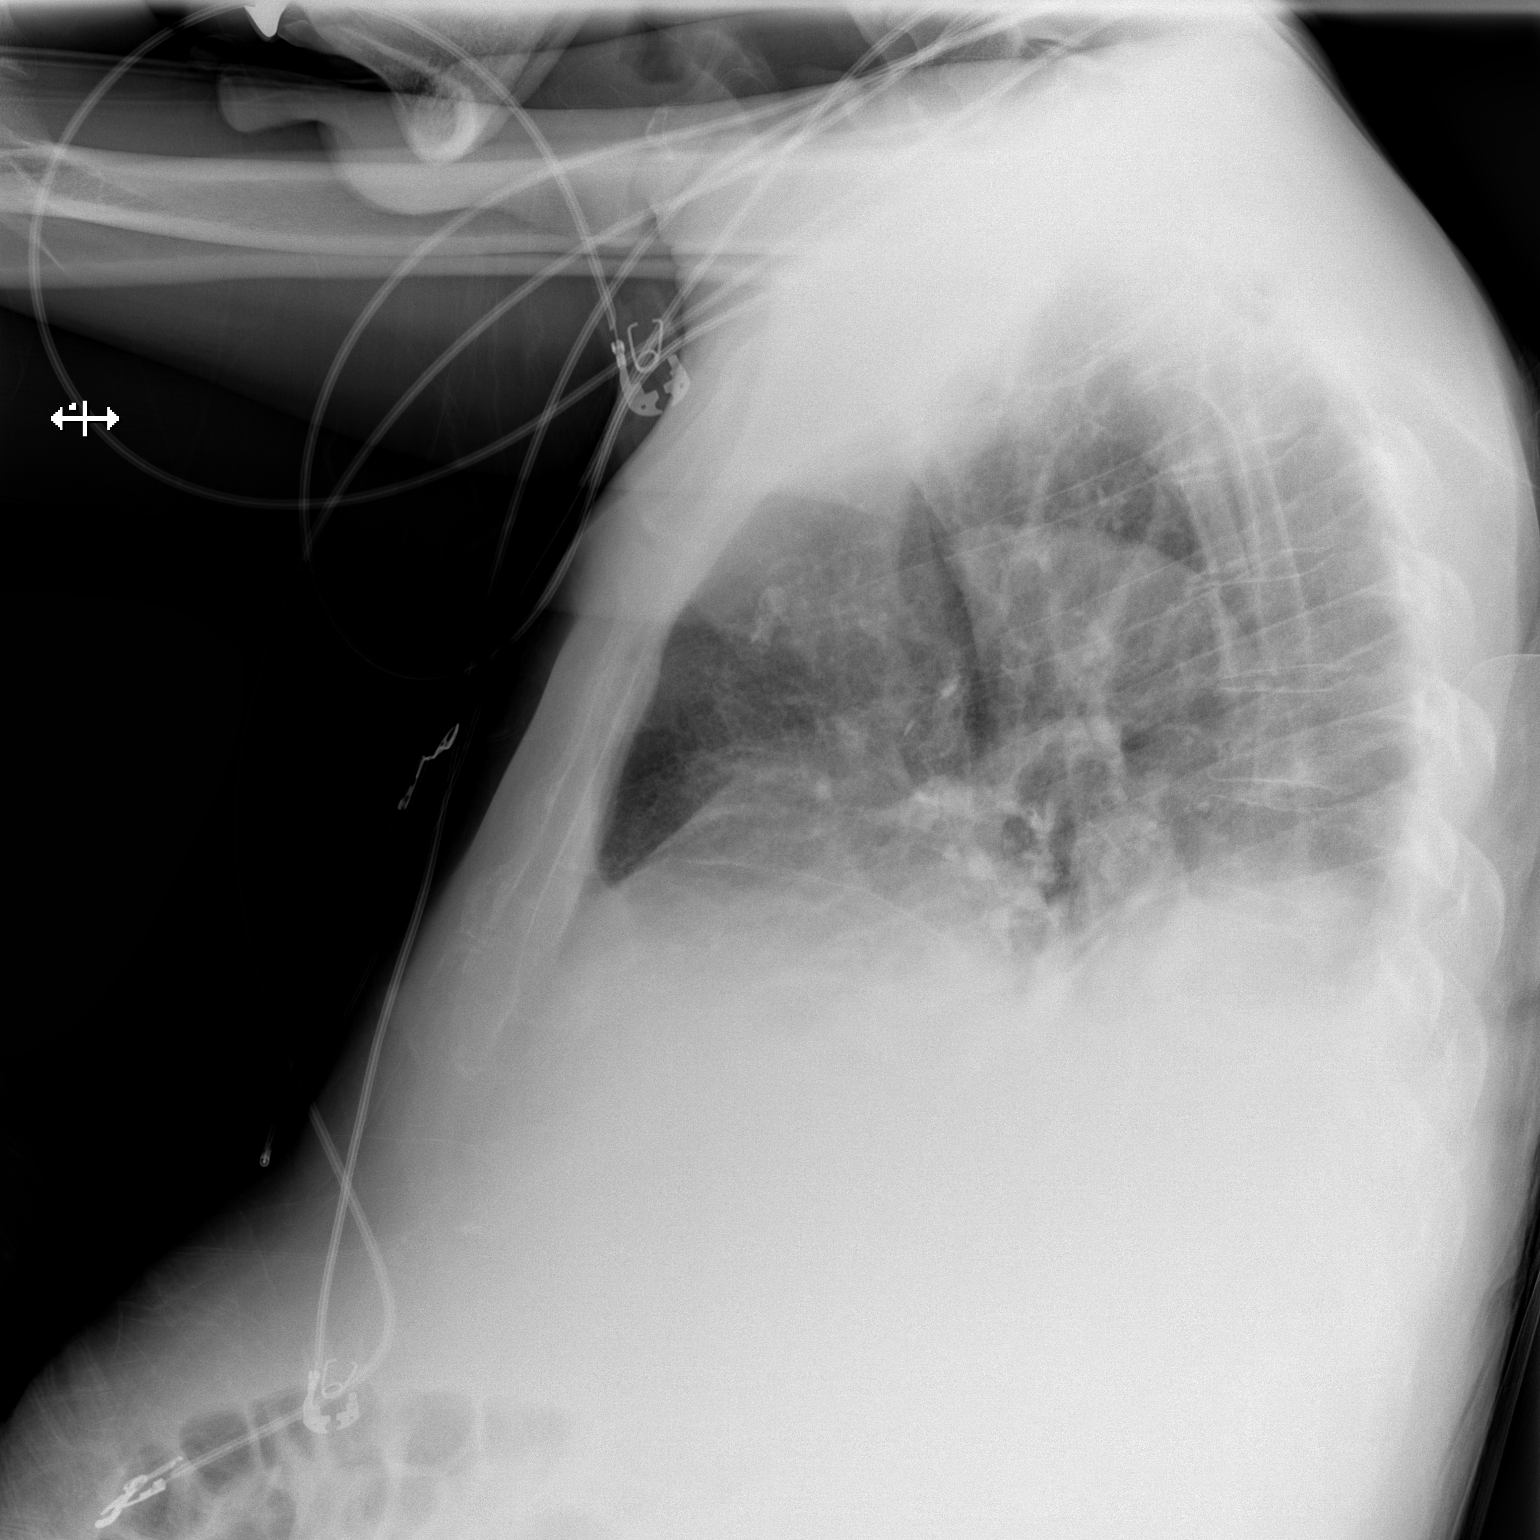

[2 of 2 positions shown; findings below may reference images not displayed]

FINDINGS: Lung volumes are low. Bibasilar opacities (right greater than left),
similar to the prior study, which may reflect areas of atelectasis
and/or airspace consolidation, with superimposed small to moderate
bilateral pleural effusions. No evidence of pulmonary edema. Heart
size appears borderline enlarged. Upper mediastinal contours are
within normal limits.
IMPRESSION: 1. Low lung volumes with small to moderate bilateral pleural
effusions and associated areas of atelectasis and/or consolidation
in the lung bases bilaterally, very similar to prior study from
04/10/2017.
